# Patient Record
Sex: Female | Born: 1960
Health system: Southern US, Community
[De-identification: ages and names within clinical notes are randomized; demographics above are authoritative.]

## PROBLEM LIST (undated history)

## (undated) DIAGNOSIS — E785 Hyperlipidemia, unspecified: Secondary | ICD-10-CM

## (undated) DIAGNOSIS — I10 Essential (primary) hypertension: Secondary | ICD-10-CM

## (undated) DIAGNOSIS — E119 Type 2 diabetes mellitus without complications: Secondary | ICD-10-CM

## (undated) DIAGNOSIS — R0600 Dyspnea, unspecified: Secondary | ICD-10-CM

## (undated) DIAGNOSIS — R011 Cardiac murmur, unspecified: Secondary | ICD-10-CM

## (undated) DIAGNOSIS — M1990 Unspecified osteoarthritis, unspecified site: Secondary | ICD-10-CM

## (undated) DIAGNOSIS — D509 Iron deficiency anemia, unspecified: Secondary | ICD-10-CM

## (undated) DIAGNOSIS — K7460 Unspecified cirrhosis of liver: Secondary | ICD-10-CM

## (undated) DIAGNOSIS — R188 Other ascites: Secondary | ICD-10-CM

## (undated) DIAGNOSIS — K219 Gastro-esophageal reflux disease without esophagitis: Secondary | ICD-10-CM

## (undated) DIAGNOSIS — T7840XA Allergy, unspecified, initial encounter: Secondary | ICD-10-CM

## (undated) DIAGNOSIS — R601 Generalized edema: Secondary | ICD-10-CM

## (undated) DIAGNOSIS — H269 Unspecified cataract: Secondary | ICD-10-CM

## (undated) DIAGNOSIS — I8500 Esophageal varices without bleeding: Secondary | ICD-10-CM

## (undated) HISTORY — DX: Unspecified osteoarthritis, unspecified site: M19.90

## (undated) HISTORY — PX: KNEE ARTHROSCOPY W/ MENISCAL REPAIR: SHX1877

## (undated) HISTORY — DX: Other ascites: R18.8

## (undated) HISTORY — DX: Allergy, unspecified, initial encounter: T78.40XA

## (undated) HISTORY — PX: EYE SURGERY: SHX253

## (undated) HISTORY — DX: Unspecified cataract: H26.9

## (undated) HISTORY — PX: CHOLECYSTECTOMY: SHX55

## (undated) HISTORY — DX: Generalized edema: R60.1

## (undated) HISTORY — PX: CARPAL TUNNEL RELEASE: SHX101

## (undated) HISTORY — PX: MENISCUS REPAIR: SHX5179

## (undated) HISTORY — DX: Type 2 diabetes mellitus without complications: E11.9

## (undated) HISTORY — DX: Esophageal varices without bleeding: I85.00

---
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
## 2009-06-24 ENCOUNTER — Encounter: Admission: RE | Admit: 2009-06-24 | Discharge: 2009-07-16 | Payer: Self-pay | Admitting: Family Medicine

## 2012-04-16 ENCOUNTER — Ambulatory Visit: Payer: Self-pay

## 2012-04-23 ENCOUNTER — Ambulatory Visit (INDEPENDENT_AMBULATORY_CARE_PROVIDER_SITE_OTHER): Payer: Self-pay | Admitting: Family Medicine

## 2012-04-23 DIAGNOSIS — E119 Type 2 diabetes mellitus without complications: Secondary | ICD-10-CM

## 2012-04-23 NOTE — Progress Notes (Signed)
Patient presents today for 3 month DM follow-up as part of the employer-sponsored Link to Wellness program. Medications, insulin regimen, and glucose readings have been reviewed. I have also discussed with patient lifestyle interventions such as diet and physical activity. Details of this visit can be found in [facility] documenting program through [facility] ([facility]). Patient has set a series of personal goals and will follow-up in 3 months for further review of DM.  

## 2012-05-07 NOTE — Progress Notes (Signed)
Patient ID: Meagan Peck, female   DOB: Jun 21, 1960, 52 y.o.   MRN: [id number] Reviewed:  Agree with documentation and management.

## 2012-07-23 ENCOUNTER — Ambulatory Visit (INDEPENDENT_AMBULATORY_CARE_PROVIDER_SITE_OTHER): Payer: Self-pay | Admitting: Family Medicine

## 2012-07-23 VITALS — BP 105/61 | HR 77 | Ht <= 58 in | Wt 167.4 lb

## 2012-07-23 DIAGNOSIS — E119 Type 2 diabetes mellitus without complications: Secondary | ICD-10-CM

## 2012-07-23 NOTE — Progress Notes (Signed)
Subjective:  Patient presents today for 3 month DM follow-up as part of the employer-sponsored Link to Wellness program. She reports that things have been going well. Her Lantus dose has dropped to 10 units. She reports that in February most of her CBGs were less than 100. No other medication changes.   Appointment pending with Dr. Littie Deeds in about 10 days. Her last A1C was 7.3% in December (patient reported).    Disease Assessments:  Diabetes:  Type of Diabetes: Type 2; Year of diagnosis 1999; Sees Diabetes provider 4 or more times per year; MD managing Diabetes Dr. Littie Deeds;  checks blood glucose once daily; checks feet daily; uses glucometer; takes medications as prescribed; takes an aspirin a day;  Highest CBG 146; Lowest CBG 59; hypoglycemia frequency once a month; very rare;   Other Diabetes History: Checking fasting glucose daily. She forgot to bring her meter with her today for her appointment. Per her report she thinks they are averaging between 100-105 fasting. She is taking Lantus 10 units every morning and glipizide if fasting CBG is greater than 100.   When her CBG was 59 she drank orange juice and it came back up.   Social History:  Medication adherence adherent;  Patient can afford medications; Patient knows the purpose/use of medications;   Physical Activity- she is getting physical activity at least 5 days a week and sometimes 6 days. 150 minutes of exercise per week.  When it was icy and snowy she wasn't getting as much exercise. Most of what she is doing is walking with hand weights. She is using 1 pound hand weights but is thinking about going up to 2 pound weights. She is spending 15-30 minutes walking.   Nutrition- Diet adherence Greater than 75% of the time;  She has tried several new vegetables since her last visit. She tried brussel sprouts, eggplant, zuchinni, spaghetti squash. She didn't like any of them.   B- wheat toast and boiled egg. Sometimes cereal. Fruit and  orange juice usually 8 oz.  L- chicken and lettuce sandwich; sometimes leftovers from dinner the night before.   D- chicken breasts, vegetable soup; green beans, garden peas, corn, sometimes ribs or steaks. Mostly chicken.    Assessment/Plan: 52 year old female with diabetes. Last A1C was close to goal at 7.3%. She has a follow up appointment in a few weeks with Dr. Littie Deeds and he will check her A1C at that point. Based on reported blood sugar readings I expect that her A1C will be lower. She reports that fasting CBGs are usually around 100 and for most of the month of February fasting CBGs were less than 100 and she didn't take any glipizide (but remained on 10 units of Lantus every morning).  One of her goals at her last visit was to try a new vegetable. She has since tried eggplant, zuchinni and brussel sprouts. She didn't like any of them, but she reports eating more vegetables in salads. She is willing to try asparagus before her next appointment. We reviewed carbohydrate counting and discussed her daily glass of orange juice. She did not realize that her glass of orange juice had close to 30 grams of carbohydrates. She states that she can change to only drinking 1/2 cup of orange juice or eliminating it. I encouraged her to eat whole fruit in place of juice whenever possible.   Patient is meeting physical activity goal of at least 150 minutes weekly. This is an improvement as when she first started  seeing me, she was not exercising at all. Her weight is increased about 2 pounds since her last appointment, but she reports that with the ice and snow she hasn't gotten out as much to walk.   Follow up with patient in 3 months..    Teaching Goals:  Diabetes Mellitus:  Goals for Next Visit-  1. Learn how to hide vegetables in recipes. Examples- adding chopped zuchinni to soups, trying butternut squash and steamed asparagus.  2. Try using 2 pounds weights when you are walking. Continue walking 5-6  days a week.   Next appointment with me is June 23rd at 3 PM.   Tobi Leinweber D. Vivia Ewing, PharmD, BCPS

## 2012-07-31 NOTE — Progress Notes (Signed)
Patient ID: Meagan Peck, female   DOB: 03/26/1961, 52 y.o.   MRN: [id number] ATTENDING PHYSICIAN NOTE: I have reviewed the chart and agree with the plan as detailed above. Dorcas Mcmurray MD Pager [phone]

## 2012-10-22 ENCOUNTER — Ambulatory Visit (INDEPENDENT_AMBULATORY_CARE_PROVIDER_SITE_OTHER): Payer: Self-pay | Admitting: Family Medicine

## 2012-10-22 VITALS — BP 119/68 | HR 76 | Ht <= 58 in | Wt 167.2 lb

## 2012-10-22 DIAGNOSIS — E119 Type 2 diabetes mellitus without complications: Secondary | ICD-10-CM

## 2012-10-22 NOTE — Progress Notes (Signed)
Subjective:  Patient presents today for 3 month diabetes follow-up as part of the employer-sponsored Link to Wellness program. Current diabetes regimen includes Lantus 10 SQ once daily, metformin 1000 mg twice daily, glipizde 10 mg ER once daily if FBG>100 (she estimates this is 4 times a week). Patient also continues on daily ASA and ACEi. Patient has a pending appt for early July with Dr. Littie Deeds. She states her last A1C was 6.8% about 3 months ago. No med changes or major health changes at this time.    Patient states that she recently saw a podiatrist and she had a corn growing over her toenail. She also has osteoarthritis in the arches of her feet. She was fitted with a special type of athletic shoe that gives her more support over her arches.    Disease Assessments:  Diabetes:  Type of Diabetes: Type 2; Year of diagnosis 1999; Sees Diabetes provider 4 or more times per year; MD managing Diabetes Dr. Littie Deeds; checks blood glucose once daily; checks feet daily; uses glucometer; takes medications as prescribed; takes an aspirin a day;   Lowest CBG 79; Highest CBG 154; hypoglycemia frequency once a week;  Other Diabetes History: Checking fasting glucose daily.   She did not bring her meter with her to the appointment today. Per patient report- highest is 154. Lowest- 79. She goes low about once a week- she states that she doesn't know why her blood sugar got low. She corrected it with orange juice.   On average she thinks that her blood sugar is running between 111-120 fasting.    Physical Activity-  She joined [employer] a month ago. She met with a personal trainer last week and she starts this week with training. She is going three times a week for 2-3 hours. When she is there she is doing the treadmill, elliptical and bicycle. She anticipates that once she is trained on the weight machines she will start doing that. She states that her trainer has a circuit mapped out for her of what she can  do.   Nutrition-  B- boiled egg and wheat toast or bowl of cereal (cheerios with milk); juice (8 oz) occasionally; or a piece of fruit  L- Stew (carrots, beef, potatoes); usually a [place] sub on wheat bread with lettuce or 1/2 ham sandwich. Fruit, jello.   D- She has been trying more vegetables- she has tried broccoli and asparagus. Usually eating some sort of meat- eating it baked or grilled.      Vital Signs:  10/22/2012 3:25 PM (EST) Blood Pressure 119 / 68 mm/HgBMI 34.9; Height 4 ft 10 in; Pulse Rate 76 bpm; Weight 167.2 lbs    Testing:  Blood Sugar Tests:  Hemoglobin A1c: 6.8    Assessment/Plan: Patient is a 52 year old female with DM2. Her most recent A1C was 6.8% with Dr. Littie Deeds in March. This is a decrease from her last A1C of 7.3%. She continues to make healthy eating choices and has continued to try and experiment with new vegetables. She has cut back on her juice intake and is mindful of how many carbohydrates she is eating throughout the day.   She has increased her commitment to physical activity and has joined a gym. She has a trainer now and anticipates using the weight machines once she is trained this week. She estimates that she spends 6-7 hours doing physical activity in the gym each week. This is an increase from her last appointment when she was  just walking after work. Weight remains the same at this visit but patient is hoping to lose weight.   Based on patient reports fasting blood sugars are within goal of less than 120 mg/dL. She is having ocassional low blood sugars, but none less than 80. These are easily corrected with juice.   Follow up with patient in 3 months..    Goals for Next Visit-  1. Stick with physical activity. Aim for 3 days a week for at least 60 minutes.  2. New vegetables for the month- try [employer] and try parsnips.   Next visit is Monday September 29th at 3 PM.

## 2013-01-21 NOTE — Progress Notes (Signed)
Patient ID: Meagan Peck, female   DOB: Dec 06, 1960, 52 y.o.   MRN: [id number] ATTENDING PHYSICIAN NOTE: I have reviewed the chart and agree with the plan as detailed above. Dorcas Mcmurray MD Pager [phone]

## 2013-02-01 ENCOUNTER — Ambulatory Visit (INDEPENDENT_AMBULATORY_CARE_PROVIDER_SITE_OTHER): Payer: Self-pay | Admitting: Family Medicine

## 2013-02-01 VITALS — BP 136/76 | HR 83 | Ht <= 58 in | Wt 166.0 lb

## 2013-02-01 DIAGNOSIS — E119 Type 2 diabetes mellitus without complications: Secondary | ICD-10-CM

## 2013-02-01 NOTE — Progress Notes (Signed)
Subjective:  Patient presents today for 3 month diabetes follow-up as part of the employer-sponsored Link to Wellness program. Current diabetes regimen includesLantus 10 units SQ once daily, metformin 1000 mg twice daily, Byetta 5 mcg SQ once daily and glipizide 10 mg ER once daily when FB >100. Patient also continues on daily ASA, and ACE-I.   She reports that she has had no medication changes since her last visit with me. She reports that she has been very busy lately with her church and because of this she hasn't been able to go to the gym as often. She is still walking.   She has an appointment pending to see Dr. Littie Deeds in a few weeks. She saw him over the summer in July and her A1C was 6.8%.    Disease Assessments:  Diabetes:  Type of Diabetes: Type 2; Year of diagnosis 1999; Sees Diabetes provider 4 or more times per year; MD managing Diabetes Dr. Littie Deeds; checks blood glucose once daily; checks feet daily; uses glucometer; takes medications as prescribed; takes an aspirin a day; hypoglycemia frequency once a week;   Lowest CBG 73; Highest CBG 136; Other Diabetes History: Checking once daily in the morning. She did not bring her meter with her to the appointment.   She reports she is going low once a week- usually down to 73. She drinks a sip of orange juice or eats a glucose tablet. Her blood sugar returns to normal afterward.   She reports that the highest her CBG has been is 136.      Physical Activity-  The last two weeks are not typical because she is spending more time at church getting ready for a revival.   Prior to that she was going to [employer]. 5 minute cardio, 30 minute circuit, 30 minute cardio and then using the machines afterwards. She estimates that this took 90 minutes. She was going to the gym 3 times a week. She reports that at first she felt really tired and it ,kicked her butt,. She reports now it is much better and easier to do. She does feel better overall  and she has more energy.   Nutrition-  She reports that she has had no changes to her diet. At each visit we look up a new vegetable and she tries it. This last time she tried [employer] and parsnips and she did not like either one of them. She tried broccoli this last time and she said it was OK.       Assessment/Plan:  Patient is a 52 year old female with DM2. Most recent A1C was 6.8% in July with Dr. Littie Deeds and was meeting goal of less than 7%. Patient denied A1C test today because she has an appointment pending with Dr. Littie Deeds in a couple of weeks.   With the exception of the past 2 weeks patient has been doing well with physical activity and has been exercising 2-3 times a week. Patient has lost about 1 pound since her last appointment with me, and is overall 3 pounds lower than she was approximately 18 months ago. I encouraged her to continue physical activity with the goal of additional weight loss.   Patient continues to try new vegetables. She is particular about what she eats and reports that she does not like the taste of many vegetables. She agreed to try artichokes this time before her next visit with me.  Based on patient reports fasting blood sugars are within goal of less  than 120 mg/dL. She is having ocassional low blood sugars, but none less than 70. These are easily corrected with juice.   Follow up with patient in 3 months.    Goals for Next Visit-  1. Try the steamable broccoli/cauliflower bags from [employer]. Also try artichokes.  2. Get back into the routine of going to the gym and exercising.   Next appointment to see me is Monday January 5th at 3 PM.

## 2013-05-13 ENCOUNTER — Ambulatory Visit (INDEPENDENT_AMBULATORY_CARE_PROVIDER_SITE_OTHER): Payer: Self-pay | Admitting: Family Medicine

## 2013-05-13 VITALS — BP 142/71 | HR 80 | Ht <= 58 in | Wt 160.2 lb

## 2013-05-13 DIAGNOSIS — E119 Type 2 diabetes mellitus without complications: Secondary | ICD-10-CM

## 2013-05-13 NOTE — Progress Notes (Signed)
Subjective:  Patient presents today for 3 month diabetes follow-up as part of the employer-sponsored Link to Wellness program. Current diabetes regimen includes lantus, byetta, and glipizide when CBG>100. Patient also continues on daily ASA, ACEi, and statin. Most recent MD follow-up was 01/2013. Patient has a pending appt with Dr. Colin Rhein. May possibly be discontinuing glipizide per MD (pending). Currently CBG 79-126, recent high of 174.         Disease Assessments:  Diabetes:  Type of Diabetes: Type 2; Year of diagnosis 1999; Sees Diabetes provider 4 or more times per year; MD managing Diabetes Dr. Colin Rhein; checks blood glucose once daily; checks feet daily; uses glucometer; takes medications as prescribed; takes an aspirin a day; Highest CBG 174; hypoglycemia frequency twice a month; Lowest CBG 68; Other Diabetes History: Continues to check once daily in the morning. Did not bring meter with her today. Glucose levels are self reported.  Reports CBGs 70s-120s. Highest 174, but rare. Lowest 68, but denies feelings and symptoms of hypoglycemia. She says she knows when her sugars will be low based on what she eats the night before. Reports numbers getting this low only twice a month. Takes glipizide around 4 times per week. Feet and toes normal, denies any issues/complaints.      Physical Activity-  Continues going to [employer], 2 days into the gym now, trying to go 3 days a week. 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 Has lost 6 lbs since she's been here. Goal weight = 120lbs.   Nutrition-  She reports that she has had no changes to her diet. At each visit we look up a new vegetable and she tries it. Last time she tried [employer] and parsnips and she did not like either one of them. This visit, she reports that she likes eating broccoli with cheese but not steamed cauliflower (can only tolerate raw)       Preventive Care:  Last Complete Physical:  08/30/2011  Hemoglobin A1c: 03/05/2013 via Dr. Cherlynn Polo office 6.8    Colonoscopy: 05/02/1998  Dilated Eye Exam: 07/23/2012  Flu vaccine: 02/04/2013  Foot Exam: 09/17/2012  Lipid Panel: 04/25/2011  Other Preventive Care Notes: Last dental exam: 12/2012       Vital Signs:  05/13/2013 2:07 PM (EST) Blood Pressure 142 / 71 mm/HgBMI 33.5; Height 4 ft 10 in; Pulse Rate 80 bpm; Weight 160.2 lbs    Testing:  Blood Sugar Tests:  Hemoglobin A1c: 6.8 via Dr. Cherlynn Polo office resulted on 01/28/2013    Objective:  Musculoskeletal: feet and toes normal.    Assessment/Plan: Patient is a 53 year old female with DM2. Most recent A1C was 6.8% in October, 2014 with Dr. Colin Rhein and was meeting goal of less than 7%. Patient denied A1C test today because she has an appointment pending with Dr. Colin Rhein in a couple of weeks.   Patient has been doing well with her weight loss goals, having lost 6lbs since her last visit. She reports less physical activity in the last several weeks because of the holidays but is now 2 days into her normal exercising 2-3 times a week at [employer] with her personal trainer. She enjoys circuit training and would like to reach a weight goal of 120 lbs. I encouraged her to continue physical activity with the goal of additional weight loss, approximately 3lbs per month.   Patient continues to try new vegetables. She is particular about what she eats and reports  that she does not like the taste of many vegetables. She says she has liked trying broccoli and cheese but cannot tolerate steamed cauliflower (can only eat it raw). 24 hour recall; Breakfast: cereal and wheat toast; Lunch: Cold cut sandwiches; Dinner: Chicken, Deer, and some vegetables. Patient encouraged to incorporate vegetables into other parts of her meals and to try finding recipes that might mask the taste vegetables.   Based on patient reports fasting blood sugars are within goal of less than 120 mg/dL. She is having  ocassional low blood sugars, but none less than 70. These are easily corrected with juice or glucose tablets. Highest sugar reported to be 174 but exceptionally rare.   Follow up with patient in 6 months.  Patient seen by Fredna Dow, PharmD, Resident..     Goals for next visit: - Continue exercising 3 times per week (2 hours per visit at the gym) - Lose 10 lbs every 3 months (~3lbs per month) - Search for new recipes where you can hide vegetables (zucchini, eggplant) - Include more vegetables in meals other than dinner Next visit to see me Monday November 18, 2013 at 3:00 pm

## 2013-10-30 ENCOUNTER — Encounter: Payer: Self-pay | Admitting: *Deleted

## 2013-10-30 NOTE — Progress Notes (Signed)
Tried to reach patient by phone to schedule a diabetic follow up but it was just busy.  Patient works from home also so unable to reach at work.  Mailed an appt reminder letter for patient to contact us to schedule an appt. Jazmin Hartsell,CMA

## 2013-11-18 ENCOUNTER — Ambulatory Visit (INDEPENDENT_AMBULATORY_CARE_PROVIDER_SITE_OTHER): Payer: Self-pay | Admitting: Family Medicine

## 2013-11-18 VITALS — BP 123/78 | HR 73 | Ht <= 58 in | Wt 165.8 lb

## 2013-11-18 DIAGNOSIS — E119 Type 2 diabetes mellitus without complications: Secondary | ICD-10-CM

## 2013-11-18 NOTE — Progress Notes (Signed)
Subjective:  Patient presents today for 3 month diabetes follow-up as part of the employer-sponsored Link to Wellness program.  Current diabetes regimen includes Byetta 5 mcg once daily, glipizide ER 10 mg once daily when FBG >100, Lantus 10 units SQ once daily, metformin 1000 mg BID.  Patient also continues on daily ASA, and ACE-I.  Patient states that she has been very busy at work. 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 She is working 50-60 hours a week. She hasn't been exercising as much lately.  Last visit with Dr. Colin Rhein was in May. She states that her most recent A1C in May was 6.8%. One medication discontinued- Centratex (vitamin supplement). Quinine 324 mg daily was added for leg cramps.   Diabetes:  Type of Diabetes: Type 2; Year of diagnosis 1999; Sees Diabetes provider 4 or more times per year; MD managing Diabetes Dr. Colin Rhein; checks blood glucose once daily; checks feet daily; uses glucometer; takes medications as prescribed; takes an aspirin a day; hypoglycemia frequency rare;   Lowest CBG 76; Highest CBG 156;   Other Diabetes History:  Continues to check once daily in the morning. Did not bring meter with her today. Glucose levels are self reported.    She reports that on average her blood sugar is about 120-125 in the morning. It has been up to 150 when she has been eating more fruit. She states that hypoglycemia is rare.  Physical Activity-  She is still going to the Gym, but not as much as she was doing before. She is going to the gym 1-2 times a week for about 2 hours. She is doing cardio (treadmill, bike). She is also doing Engineer, structural. Walking most days for about 30 minutes.   Nutrition-  She reports that she has been eating more fruit over the summer. She noticed that her blood sugar was higher on the days she was eating more fruit.   Hemoglobin A1c: 09/03/2013 via Dr. Cherlynn Polo office 6.8   11/18/2013 4:37 PM (EST) Blood  Pressure 123 / 78 mm/HgBMI 34.6; Height 4 ft 10 in; Pulse Rate 73 bpm; Weight 165.8 lbs   Assessment/Plan: Patient is a 53 year old female with DM2. Most recent A1C was 6.8% and is meeting goal of less than 7%. She has a pending appointment to visit with Dr. Colin Rhein in a couple of weeks. She has gained 5 pounds since her last appointment with me. Patient attributes this to not being to exercise as much in the past several months due to her increased work schedule.     Self reported blood sugars are in range. She states she is usually running in the low 100s. She denies hypoglycemia. Elevated CBGs are attributed to increased fruit consumption.     Patient continues to make healthy eating choices. She is also still exercising, just not as much. She is not going to the gym as frequently as she was before. She continues to walk.     I showed patient how to sign up for the Wellness Rewards website and claim her badges.     No other major changes. Follow up with patient in 3 months. .   Goals for Next Visit-     1. Log onto the wellness website and complete your online wellness profile. Once you have done that, go to the Rewards section to verify that you have earned the badge.  2. To schedule an appointment with the dietitian, call the  [facility]- [phone]) [phone]  3. Increase physical activity- try to extend walk to 40 or 45 minutes daily.   4. Bring me the letter from the family practice center.      Next appointment to see me is Monday December 14th at 3 PM.

## 2013-12-02 ENCOUNTER — Encounter: Payer: Self-pay | Admitting: Family Medicine

## 2013-12-02 NOTE — Progress Notes (Signed)
Patient ID: Meagan Peck, female   DOB: Apr 28, 1961, 53 y.o.   MRN: [id number] Reviewed: Agree with our Pharmacologist's documentation and management.

## 2013-12-02 NOTE — Progress Notes (Signed)
Patient ID: Meagan Peck, female   DOB: [birth year], 53 y.o.   MRN: [id number] Reviewed: Agree with our Pharmacologist's documentation and management.

## 2013-12-26 ENCOUNTER — Encounter: Payer: Self-pay | Admitting: Family Medicine

## 2013-12-26 NOTE — Progress Notes (Signed)
Patient ID: Meagan Peck, female   DOB: 10/20/1960, 53 y.o.   MRN: [id number] Reviewed: Agree with the documentation and management by my Cassandra.

## 2014-04-14 ENCOUNTER — Ambulatory Visit (INDEPENDENT_AMBULATORY_CARE_PROVIDER_SITE_OTHER): Payer: Self-pay | Admitting: Family Medicine

## 2014-04-14 VITALS — BP 116/62 | Wt 165.2 lb

## 2014-04-14 DIAGNOSIS — E119 Type 2 diabetes mellitus without complications: Secondary | ICD-10-CM

## 2014-04-14 NOTE — Assessment & Plan Note (Signed)
Subjective:  Patient presents today for 3 month diabetes follow-up as part of the employer-sponsored Link to Wellness program. Current diabetes regimen includes Lantus 10-14 units once daily; metformin 1000 mg BID, Byetta 5 mcg once daily and Glipizide 10 mg ER once daily. Patient also continues on daily ASA, ACEi. No major health changes at this time.   Patient states that things have been really busy lately. 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  Medication change- Lantus dose has increased. She is now taking 10 units once daily for FBG less than 120; 12 units for FBG 120-140; 14 units for FBG >140.  A1C at last visit with Dr. Colin Rhein was 7.1% in October. She goes back to see him in January.   Disease Assessments:  Diabetes: Type of Diabetes: Type 2; Year of diagnosis 1999; Sees Diabetes provider 4 or more times per year; MD managing Diabetes Dr. Colin Rhein; checks blood glucose once daily; checks feet daily; uses glucometer; takes medications as prescribed; takes an aspirin a day; hypoglycemia frequency rare;   Highest CBG 200 ate dinner right before she went to bed.; Lowest CBG 89; Other Diabetes History:   Continues to check once daily in the morning fasting. Did not bring meter with her today. Glucose levels are self reported.  She states that she is running usually around 125-126. Lantus dose changed at last appointment- she is now taking 10 units for FBG<120; 12 units for FBG 120-140; 14 units for FBg >140. Also taking Glipizide ER 10 mg daily, Byetta 5 mcg daily, Metformin 1000 mg BID. Denies hypoglycemia.     Tobacco Assessment: Smoking Status: Never smoker; Last Reviewed: 04/14/2014   Social History:  Caffeine use: soda 1 per daysoda occasionally. ; Diet adherence Greater than 75% of the time; Denies alcohol use; Social work consult was not done; No drug use; Medication adherence adherent; sexually active; Patient can afford medications;  Patient knows the purpose/use of medications; Exercise adherence 3-4 days a week; 270 minutes of exercise per week.  Occupation: Medical Record Coder  Physical Activity- She is going to the gym once a week for 3 hours on the weekends. During the week she is walking 30-45 minutes daily.  Nutrition- She reports no major changes. Sometimes she is skipping dinner because she is working late. When this happens she is just eating snack in the late afternoon (apple, cheese crackers). Always eating at least 2 meals daily.    Preventive Care:  Last Complete Physical: 08/30/2011  Hemoglobin A1c: 01/30/2014 via Dr. Cherlynn Polo office 7.1    Colonoscopy: 05/02/1998  Dilated Eye Exam: 03/03/2014  Flu vaccine: 01/30/2014  Foot Exam: 09/17/2012  Lipid Panel: 04/25/2011  Other Preventive Care Notes:  Last dental exam: June 2015      Overall Health Assessments:  Vision:  Dilated Eye Exam: 03/03/2014   Vital Signs:  04/14/2014 4:52 PM (EST)Blood Pressure 116 / 62 mm/HgBMI 34.5; Height 4 ft 10 in; Weight 165.2 lbs   Testing:  Blood Sugar Tests: Hemoglobin A1c: 7.1 via Dr. Colin Rhein resulted on 01/30/2014   Care Planning:  Learning Preference Assessment:  Learner: Patient  Readiness to Learn Barriers: None  Teaching Method: Explanation  Evaluation of Learning: Can function independently and verbalize knowledge  Readiness to Change:  How important is your health to you? 10  How confident are you in working to improve your health? 8  How ready are you to change to improve your health? 10  Total Score: 9  Care Plan:  04/14/2014 4:52 PM (EST) (1)  Problem: [facility] at [employer]  Role: Clinical Pharmacist  Long Term Goal Log onto [email or web address] to finish your wellenss profile and claim your reward.  Date Started: 04/14/2014     Assessment/Plan: Patient is a 53 year old female with DM2. Most recent A1C in October was 7.1% and is close to meeting goal of less than 7%.  Patient has been under increased stress lately as she is working long hours. She has also decreased physical activity frequency because of her long working hours. She is taking more Lantus than she was at her last visit with me and FBG are running slightly higher than they were at last visit. Patient is hoping that she can cut back to 40 hours a week soon and then increase physical activity again.  Showed patient how to log into the wellness website in order to claim her wellness rewards. Follow up with patient in 3 months.  Goals for Next Visit- 1. Continue physical activity (walking). Increase the number of times that you exercise at the gym as much as you can given your work schedule. 2. Log into the wellness website so you can finish your wellness profile. Think about going to see a dietitian in order to earn the healthy weight badge. Start logging in your walks. Next appointment to see me is Monday March 21st at 3 PM.

## 2014-05-15 NOTE — Progress Notes (Signed)
Patient ID: Meagan Peck, female   DOB: [name] 28, 1961, 54 y.o.   MRN: [id number] Reviewed: Agree with the documentation and management of our [place].

## 2014-07-21 ENCOUNTER — Ambulatory Visit: Payer: Self-pay | Admitting: Pharmacist

## 2014-08-04 ENCOUNTER — Encounter: Payer: Self-pay | Admitting: Pharmacist

## 2014-08-04 ENCOUNTER — Other Ambulatory Visit: Payer: Self-pay | Admitting: Pharmacist

## 2014-08-04 NOTE — Patient Outreach (Unsigned)
[facility]) Care Management  [place]   08/04/2014  Meagan Peck August 25, 1960 [id number]   Subjective:  Patient presents today for 3 month diabetes follow-up as part of the employer-sponsored Link to Wellness program.  Current diabetes regimen includes Byetta 5 mcg SQ BID, Glipizide XL 10 mg po daily if fasting CBG >100, Lantus 10-14 units SQ once daily.  Patient also continues on daily ASA, ACE Inhibitor.  Most recent MD follow-up was in February with Dr. Colin Rhein. A1C at that time was 7.8%.  Patient has a pending appt for May 2016. No major health changes at this time. Byetta was increased to 5 mcg BID at last visit. She states that she is taking glipizide 4-5 days a week.   Patient states that she is doing well. She has enrolled in the [facility] trial through work and is monitoring CBG through the glucometer provided with the program. She also has a blue tooth enabled scale and pedometer she is using with the program.     Current Medications: Current Outpatient Prescriptions  Medication Sig Dispense Refill  . aspirin 81 MG tablet Take 81 mg by mouth daily.    . Cholecalciferol (VITAMIN D3) 5000 UNITS TABS Take 1 tablet by mouth daily.    Marland Kitchen exenatide (BYETTA) 5 MCG/0.02ML SOLN Inject 5 mcg into the skin daily before breakfast.    . glipiZIDE (GLUCOTROL XL) 10 MG 24 hr tablet Take 10 mg by mouth daily. Take 1 tablet daily if fasting blood sugar is greater than 100.    . hydrochlorothiazide (HYDRODIURIL) 25 MG tablet Take 25 mg by mouth daily.    . insulin glargine (LANTUS) 100 UNIT/ML injection Inject 10-14 Units into the skin daily. If FBG >120, 10 units; FBG 120-140, 12 units; FBG >140, 14 units.    Meagan Peck 500 MG CAPS Take 500 mg by mouth daily.    Marland Kitchen lisinopril (PRINIVIL,ZESTRIL) 40 MG tablet Take 40 mg by mouth daily.    . metFORMIN (GLUCOPHAGE) 1000 MG tablet Take 1,000 mg by mouth 2 (two) times daily with a meal.    . pantoprazole (PROTONIX) 40 MG tablet  Take 40 mg by mouth 2 (two) times daily.     No current facility-administered medications for this visit.    [place]        Patient Outreach from 08/04/2014 in [place] Problem One  Physical Inactivity   Care Plan for Problem One  Active   Interventions for Problem One Long Term Goal  Encouraged patient to add a daily walk after dinner and continue going to the [employer] at least once a week and as her schedule allows. Reviewed the importance of physical activity in relieving stress and improving blood sugar.    [facility] Long Term Goal (31-90 days)  Increase physical activity to achieve 10,000 steps daily.    [facility] Long Term Goal Start Date  08/04/14     Assessment/Plan:  Patient is a 54  yo female/female with DM 2. Most recent A1C was  7.8 % which is at exceeding goal of less than 7%. Weight is stable from last visit with me.   CBG review shows that the majority of readings are in goal range of 80-120 mg/dL. She has a few excursions into the 150-170 range. She states that she is taking Lantus on a daily basis and ends up taking glipizide 4-5 times a week.   Lifestyle improvements:  Physical Activity-  She hasn't been able to exercise as much as she was previously because she is working 10-11 hour days now. She is still getting to the gym once a week and is walking here and there, but that's it. She is now wearing a pedometer as part of the [place] study that she is participating with and she is averaging 6000-7000 steps a day. She states she would like to get 10,000 steps daily but isn't sure that she can spend as much time as she needs to exercising.   Nutrition-  She reports no major changes since the last visit with me. She is not counting carbohydrates but is following the plate method.   Patient had the One Touch Meter which is no longer covered by the Link to Wellness program. Gave patient a new True Result Meter. I will fax Dr.  Cherlynn Polo office for new testing supplies.   Reviewed with patient how to log into the wellness website and claim her wellness rewards. Showed to patient how to sync the [facility] app so that she can record her steps into the program.    Follow up with me in 3 months.     Goals for Next Visit:  1. Log onto the wellness website and claim your rewards. Finish your wellness profile and sync your Withings app to the website:  [email or web address]  2. Work to achieve 10,000 steps daily. Ways to achieve this- longer, non direct trips to the bathroom; short walks after dinner etc.       Next appointment to see me is: Monday July 18th at 4:30 PM.  T[place]ett [place]. Donneta Romberg, PharmD, BCPS, CDE Norm Parcel to [place] Coordinator [phone]

## 2014-08-04 NOTE — Patient Instructions (Signed)
Goals for Next Visit:  1. Log onto the wellness website and claim your rewards. Finish your wellness profile and sync your Withings app to the website:  [email or web address]  2. Work to achieve 10,000 steps daily. Ways to achieve this- longer, non direct trips to the bathroom; short walks after dinner etc.       Next appointment to see me is: Monday July 18th at 4:30 PM.

## 2014-08-05 ENCOUNTER — Other Ambulatory Visit: Payer: Self-pay

## 2014-09-26 ENCOUNTER — Encounter: Payer: Self-pay | Admitting: Pharmacist

## 2014-11-17 ENCOUNTER — Ambulatory Visit: Payer: 59 | Admitting: Pharmacist

## 2014-12-01 ENCOUNTER — Ambulatory Visit: Payer: Self-pay | Admitting: Pharmacist

## 2014-12-01 ENCOUNTER — Ambulatory Visit (INDEPENDENT_AMBULATORY_CARE_PROVIDER_SITE_OTHER): Payer: Self-pay | Admitting: Family Medicine

## 2014-12-01 VITALS — BP 116/64 | Ht <= 58 in | Wt 163.8 lb

## 2014-12-01 DIAGNOSIS — E119 Type 2 diabetes mellitus without complications: Secondary | ICD-10-CM

## 2014-12-01 NOTE — Progress Notes (Signed)
Subjective:  Patient presents today for 3 month diabetes follow-up as part of the employer-sponsored Link to Wellness program.  Current diabetes regimen includes Byetta 5 mcg SQ once daily, metformin 1000 mg BID, Glipizide 10 mg XL once daily if FBG>100, Lantus 8-12 units SQ once daily.   Patient also continues on daily ASA, ACE Inhibitor.  No med changes or major health changes at this time.   Last appointment with Dr. Colin Rhein was in June. A1C was 7.2% at that visit. Next appointment is pending for September.    Assessment/Plan:  Patient is a 54 y.o. female with DM 2. Most recent A1C was   7.2% which is slightly exceeding goal of less than 7%. Weight is stable from last visit with me.   CBG Review: Patient did not bring meter with her to the appointment.   Patient reported: Low/High: 74/160 She is checking once daily fasting and she reports the majority of the readings are between 110-120.   A few low blood sugars in the 70s. She reports no symptoms with these. She take glucose tablets to correct for the low.   Lifestyle improvements:  Physical Activity-  Work has been busier lately and she is not exercising as much as she was previously. She is walking for 20-30 minutes 6 days a week. She has not been going to [employer] like she was previously.    Nutrition-  Patient reports no changes. She does state that she is eating more fruit lately and wanted to know what fruit would be best for her. I reviewed nutrition information with her regarding the fruit she most commonly eats and reviewed with patient how to calculate carbohydrate quantity using portion sizes. Also reminded patient of carbohydrate goals of 45-60 grams with meals.    Follow up with me in 3 months.    Goals for Next Visit:  1. Log onto the Live [facility] Well website and claim your wellness rewards.  2. Keep walking six days a week for at least 30 minutes.     Next appointment to see me is: Monday November 14th at  2:30 PM.    Jinny Blossom D. Donneta Romberg, PharmD, BCPS, CDE Norm Parcel to [place] Coordinator [phone]

## 2014-12-13 NOTE — Progress Notes (Signed)
Patient ID: Meagan Peck, female   DOB: 23-Dec-1960, 54 y.o.   MRN: [id number] ATTENDING PHYSICIAN NOTE: I have reviewed the chart and agree with the plan as detailed above. Dorcas Mcmurray MD Pager [phone]

## 2015-03-16 ENCOUNTER — Ambulatory Visit: Payer: 59 | Admitting: Pharmacist

## 2015-03-30 ENCOUNTER — Ambulatory Visit: Payer: Self-pay | Admitting: Pharmacist

## 2015-04-03 ENCOUNTER — Ambulatory Visit (INDEPENDENT_AMBULATORY_CARE_PROVIDER_SITE_OTHER): Payer: Self-pay | Admitting: Family Medicine

## 2015-04-03 ENCOUNTER — Encounter: Payer: Self-pay | Admitting: Pharmacist

## 2015-04-03 VITALS — BP 106/64 | Ht <= 58 in | Wt 161.2 lb

## 2015-04-03 DIAGNOSIS — E119 Type 2 diabetes mellitus without complications: Secondary | ICD-10-CM

## 2015-04-03 NOTE — Progress Notes (Signed)
Subjective:  Patient presents today for 3 month diabetes follow-up as part of the employer-sponsored Link to Wellness program.  Current diabetes regimen includes Lantus 10-12 untis SQ once daily, Byetta 5 mcg SQ BID, Glipizide 10 mg daily when FBG>100.  Patient also continues on daily ASA and ACE Inhibitor.  Most recent MD follow-up was with Dr. Colin Rhein in September. A1C at that visit 7.1%.  Patient has a pending appt for this coming week with Dr. Colin Rhein. No major health changes at this time. Byetta was increased to 5 mcg BID.     Assessment:  Diabetes: Most recent A1C was 7.1   % which is at slightly exceeding goal of less than 7%. Weight is decreased from last visit with me.   Patient is not taking a statin currently. Based on the ASCVD algorithm her 10 year cardiovascular risk is 2.6% and it is recommended that she be on a moderate intensity statin. I asked patient to bring this up with Dr. Colin Rhein at her next visit this upcoming week.    CBG Review: Patient did not bring meter with her to the appointment. She is checking her blood sugar once daily in the morning, fasting. Per patient report she states it is running about 120-124. Highest- 136, Lowest- 82 (skipped dinner the night before). Denies hypoglycemia.    Lifestyle improvements:  Physical Activity-  Patient states that she is trying to take the stairs instead. She is no longer going to [employer] to work out. She states she is walking five days a week for 30 minutes.    Nutrition-  No major changes. She states that she has cut back on potatoes.    Follow up with me in 3 months.    Plan/Goals for Next Visit:  1. Log into the Wellness Website- [email or web address] and start tracking your walks.  2. Once before you come back for your next appointment, try out the fitness room on the 1st floor of [facility].     Next appointment to see me is: Monday March 6th at 4 PM.    Jinny Blossom D. Donneta Romberg, PharmD, BCPS, CDE Norm Parcel  to [place] Coordinator [phone]

## 2015-04-16 NOTE — Progress Notes (Signed)
I have reviewed this pharmacist's note and agree  

## 2015-05-21 DIAGNOSIS — E039 Hypothyroidism, unspecified: Secondary | ICD-10-CM | POA: Diagnosis not present

## 2015-05-21 DIAGNOSIS — E785 Hyperlipidemia, unspecified: Secondary | ICD-10-CM | POA: Diagnosis not present

## 2015-05-21 DIAGNOSIS — E119 Type 2 diabetes mellitus without complications: Secondary | ICD-10-CM | POA: Diagnosis not present

## 2015-05-25 DIAGNOSIS — Z794 Long term (current) use of insulin: Secondary | ICD-10-CM

## 2015-05-25 DIAGNOSIS — I10 Essential (primary) hypertension: Secondary | ICD-10-CM | POA: Insufficient documentation

## 2015-05-25 DIAGNOSIS — E119 Type 2 diabetes mellitus without complications: Secondary | ICD-10-CM | POA: Insufficient documentation

## 2015-05-25 DIAGNOSIS — E1169 Type 2 diabetes mellitus with other specified complication: Secondary | ICD-10-CM | POA: Insufficient documentation

## 2015-05-25 DIAGNOSIS — K219 Gastro-esophageal reflux disease without esophagitis: Secondary | ICD-10-CM | POA: Insufficient documentation

## 2015-05-25 DIAGNOSIS — E785 Hyperlipidemia, unspecified: Secondary | ICD-10-CM | POA: Diagnosis not present

## 2015-05-25 MED FILL — ROSUVASTATIN CALCIUM 20 MG: 20 | 90 days supply | Qty: 90 | Fill #0

## 2015-05-27 DIAGNOSIS — D3132 Benign neoplasm of left choroid: Secondary | ICD-10-CM | POA: Diagnosis not present

## 2015-05-27 DIAGNOSIS — H5203 Hypermetropia, bilateral: Secondary | ICD-10-CM | POA: Diagnosis not present

## 2015-05-27 DIAGNOSIS — H1859 Other hereditary corneal dystrophies: Secondary | ICD-10-CM | POA: Diagnosis not present

## 2015-05-27 DIAGNOSIS — E119 Type 2 diabetes mellitus without complications: Secondary | ICD-10-CM | POA: Diagnosis not present

## 2015-05-27 DIAGNOSIS — D3131 Benign neoplasm of right choroid: Secondary | ICD-10-CM | POA: Diagnosis not present

## 2015-06-04 MED FILL — PANTOPRAZOLE SOD DR 40 MG T: 40 | 90 days supply | Qty: 180 | Fill #0

## 2015-06-04 MED FILL — glipiZIDE XL 10 MG TB24: 10 | 90 days supply | Qty: 90 | Fill #3

## 2015-06-04 MED FILL — LISINOPRIL 40 MG TABLET: 40 | 90 days supply | Qty: 90 | Fill #3

## 2015-06-04 MED FILL — TRUE METRIX GLUCOSE TEST ST: 90 days supply | Qty: 100 | Fill #0

## 2015-06-04 MED FILL — HYDROCHLOROTHIAZIDE 25 MG T: 25 | 90 days supply | Qty: 90 | Fill #3

## 2015-06-19 MED FILL — LANTUS SOLOSTAR 100 UNITS/M: 100 | 30 days supply | Qty: 15 | Fill #2

## 2015-06-25 MED FILL — metFORMIN HCL 1000 MG TABS: 1000 | 90 days supply | Qty: 180 | Fill #0

## 2015-07-06 ENCOUNTER — Ambulatory Visit (INDEPENDENT_AMBULATORY_CARE_PROVIDER_SITE_OTHER): Payer: Self-pay | Admitting: Family Medicine

## 2015-07-06 ENCOUNTER — Encounter: Payer: Self-pay | Admitting: Pharmacist

## 2015-07-06 VITALS — BP 110/58 | Wt 159.0 lb

## 2015-07-06 DIAGNOSIS — E119 Type 2 diabetes mellitus without complications: Secondary | ICD-10-CM

## 2015-07-06 DIAGNOSIS — Z794 Long term (current) use of insulin: Secondary | ICD-10-CM

## 2015-07-06 NOTE — Progress Notes (Signed)
Subjective:  Patient presents today for 3 month diabetes follow-up as part of the employer-sponsored Link to Wellness program.  Current diabetes regimen includes metformin 1000 mg BID, Byetta 5 mcg SQ BID, glipizide 10 mg ER when FBG>100, Lantus 14 units SQ once daily. Patient also continues on daily ASA, ACE Inhibitor and statin.  Most recent MD follow-up was with Dr. Colin Rhein in January. A1C at that visit was 7.8%.   Patient has a pending appt for April with Dr. Colin Rhein.  No major health changes at this time.   Medication changes- Dr. Colin Rhein increased lantus to 14 units SQ daily. Patient has also started a statin based on recommendations from last visit- now taking crestor 20 mg three times a week. She states she had some muscle pain the first week but it has diminished since then.    Assessment:  Diabetes: Most recent A1C was  7.8 % which is at exceeding goal of less than 7%. Weight is decreased from last visit with me.   Patient thinks that her A1C was increased because she was eating more over Christmas. Historically she says her A1C has been elevated at the beginning of the year.   CBG Review: She reports that she has not had any low blood sugars since increasing Lantus.   She is checking her blood sugar once daily in the morning. She forgot to bring her meter with her to the appointment. Per patient report she states that she is running about 120 in the morning (prior to increasing Lantus she was running 130). Right now she is taking glipizide every day during the week.    Lifestyle improvements:  Physical Activity-  Weight is down 2 pounds since her last visit with me and 5 pounds overall since last year. 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 She is also taking the stairs.   She tried out the gym on the first floor of the hospital (one of her goals last time). She is walking 30 minutes daily except Sunday. She is tracking her  workouts in the [place] program.    Nutrition-  She states that things are going well with eating. She states that she still can't give up her potatoes but has cut back on portion sizes (eating twice a week, less than a cupful). She is still struggling with vegetable intake. She would like to eat more but states that she doesn't have a taste for vegetables.     Follow up with me in 3 months.    Plan/Goals for Next Visit:  1. Make an appointment to see the dentist. 2. Increase vegetable intake- aim for once a week having a snack of raw broccoli or cauliflower.  3. Continue physical activity- walking 5-6 days a week.      Next appointment to see me is: Monday June 5th at 4 PM.    Jinny Blossom D. Donneta Romberg, PharmD, BCPS, CDE Norm Parcel to [place] Coordinator [phone]

## 2015-07-20 MED FILL — BYETTA 5 MCG DOSE PEN INJ: 5 | 90 days supply | Qty: 4 | Fill #0

## 2015-08-12 MED FILL — UNIFINE PENTIPS 8MM 31G: 31G X 8 MM | 90 days supply | Qty: 300 | Fill #2

## 2015-08-13 NOTE — Progress Notes (Signed)
I have reviewed this pharmacist's note and agree  

## 2015-09-02 MED FILL — glipiZIDE XL 10 MG TB24: 10 | 90 days supply | Qty: 90 | Fill #0

## 2015-09-02 MED FILL — HYDROCHLOROTHIAZIDE 25 MG T: 25 | 90 days supply | Qty: 90 | Fill #4

## 2015-09-02 MED FILL — PANTOPRAZOLE SOD DR 40 MG T: 40 | 90 days supply | Qty: 180 | Fill #1

## 2015-09-02 MED FILL — LISINOPRIL 40 MG TABLET: 40 | 90 days supply | Qty: 90 | Fill #0

## 2015-09-23 MED FILL — metFORMIN HCL 1000 MG TABS: 1000 | 90 days supply | Qty: 180 | Fill #1

## 2015-10-05 ENCOUNTER — Ambulatory Visit: Payer: Self-pay | Admitting: Pharmacist

## 2015-10-05 MED FILL — BYETTA 5 MCG DOSE PEN INJ: 5 | 90 days supply | Qty: 4 | Fill #1

## 2015-10-05 MED FILL — TRUE METRIX GLUCOSE TEST ST: 90 days supply | Qty: 100 | Fill #0

## 2015-10-05 MED FILL — LANTUS SOLOSTAR 100 UNITS/M: 100 | 75 days supply | Qty: 15 | Fill #0

## 2015-10-06 DIAGNOSIS — E119 Type 2 diabetes mellitus without complications: Secondary | ICD-10-CM | POA: Diagnosis not present

## 2015-10-06 DIAGNOSIS — Z794 Long term (current) use of insulin: Secondary | ICD-10-CM | POA: Diagnosis not present

## 2015-10-07 DIAGNOSIS — M24611 Ankylosis, right shoulder: Secondary | ICD-10-CM | POA: Diagnosis not present

## 2015-10-07 DIAGNOSIS — M25561 Pain in right knee: Secondary | ICD-10-CM | POA: Diagnosis not present

## 2015-10-12 DIAGNOSIS — I10 Essential (primary) hypertension: Secondary | ICD-10-CM | POA: Diagnosis not present

## 2015-10-12 DIAGNOSIS — M25561 Pain in right knee: Secondary | ICD-10-CM | POA: Diagnosis not present

## 2015-10-12 DIAGNOSIS — E119 Type 2 diabetes mellitus without complications: Secondary | ICD-10-CM | POA: Diagnosis not present

## 2015-10-12 DIAGNOSIS — Z794 Long term (current) use of insulin: Secondary | ICD-10-CM | POA: Diagnosis not present

## 2015-10-12 DIAGNOSIS — Z1211 Encounter for screening for malignant neoplasm of colon: Secondary | ICD-10-CM | POA: Diagnosis not present

## 2015-10-30 ENCOUNTER — Encounter: Payer: Self-pay | Admitting: Pharmacist

## 2015-10-30 ENCOUNTER — Other Ambulatory Visit: Payer: Self-pay | Admitting: Pharmacist

## 2015-10-30 ENCOUNTER — Ambulatory Visit: Payer: Self-pay | Admitting: Pharmacist

## 2015-10-30 VITALS — BP 92/60 | Wt 158.8 lb

## 2015-10-30 DIAGNOSIS — Z794 Long term (current) use of insulin: Secondary | ICD-10-CM

## 2015-10-30 DIAGNOSIS — E119 Type 2 diabetes mellitus without complications: Secondary | ICD-10-CM

## 2015-10-30 NOTE — Patient Outreach (Signed)
Subjective:  Patient presents today for 3 month diabetes follow-up as part of the employer-sponsored Link to Wellness program.  Current diabetes regimen includes Lantus 14 units SQ once daily, glipizide XL 10 mg if FBG >100 mg/dL, Byetta 5 mcg SQ BID and metformin 1000 mg BID. Patient also continues on daily ASA, ACE Inhibitor and statin.  Most recent MD follow-up was with Dr. Colin Rhein earlier this month. A1C at that visit was 8.0%. Patient has a pending appt for September.  No med changes or major health changes at this time.   Patient reports that she had a steroid injection into her knee in May and that may have contributed to an elevated A1C.   Assessment:  Diabetes: Most recent A1C was 8.0%  which is at exceeding goal of less than 7%. Weight is stable from last visit with me.    CBG Review: She reports checking CBG once daily fasting. She did not bring her meter with her to the appointment. Per patient report she states that fasting CBGs lately are averaging 140 mg/dL. For the few weeks after the steroid injection fasting CBGs were in the high 100s, low 200s. They have since come back down. Patient states that she has been taking glipizide XL 10 mg on a daily basis for the past several months.   Per patient report- Low/High- 99/236; denies hypoglycemia.    Lifestyle improvements:  Physical Activity-  She states that she is still walking although not as briskly as she was previously. She injured her knee in May following a rogue stove that fell off a hand truck into her knee. She states that she is walking 6 days a week for 30 minutes.    Nutrition-  No major changes.    Follow up with me in 6 weeks for a phone visit.    Plan/Goals for Next Visit:  1. If you have to receive another steroid injection into your knee, call Dr. Cherlynn Polo office and ask if you can temporarily increase your Lantus dose in anticipation of blood glucose elevation.  2. Find a dentist and make an appointment  for a cleaning and exam. [email or web address] 3. Continue logging your workouts into Live Life Well and once you have hit 60 workouts, claim the physical activity badge (under the main page, "healthy rewards program, year 3" in the upper right hand of the screen.     Next appointment to see me is: I will call you for a phone call August 11th to check in and see how you are doing.    Kentavious Michele D. Donneta Romberg, PharmD, BCPS, CDE Norm Parcel to [place] Coordinator [phone]

## 2015-11-02 DIAGNOSIS — M25561 Pain in right knee: Secondary | ICD-10-CM | POA: Diagnosis not present

## 2015-11-06 ENCOUNTER — Other Ambulatory Visit (HOSPITAL_COMMUNITY): Payer: Self-pay | Admitting: Orthopaedic Surgery

## 2015-11-06 DIAGNOSIS — M25561 Pain in right knee: Secondary | ICD-10-CM

## 2015-11-10 ENCOUNTER — Ambulatory Visit (HOSPITAL_COMMUNITY)
Admission: RE | Admit: 2015-11-10 | Discharge: 2015-11-10 | Disposition: A | Discharge status: Home / Self Care | Payer: 59 | Source: Ambulatory Visit | Attending: Orthopaedic Surgery | Admitting: Orthopaedic Surgery

## 2015-11-10 DIAGNOSIS — S83241A Other tear of medial meniscus, current injury, right knee, initial encounter: Secondary | ICD-10-CM | POA: Diagnosis not present

## 2015-11-10 DIAGNOSIS — M25561 Pain in right knee: Secondary | ICD-10-CM | POA: Insufficient documentation

## 2015-11-10 DIAGNOSIS — M7121 Synovial cyst of popliteal space [Baker], right knee: Secondary | ICD-10-CM | POA: Insufficient documentation

## 2015-11-10 DIAGNOSIS — X58XXXA Exposure to other specified factors, initial encounter: Secondary | ICD-10-CM | POA: Insufficient documentation

## 2015-12-02 DIAGNOSIS — S83241D Other tear of medial meniscus, current injury, right knee, subsequent encounter: Secondary | ICD-10-CM | POA: Diagnosis not present

## 2015-12-02 DIAGNOSIS — M25561 Pain in right knee: Secondary | ICD-10-CM | POA: Diagnosis not present

## 2015-12-02 MED FILL — glipiZIDE XL 10 MG TB24: 10 | 90 days supply | Qty: 90 | Fill #0

## 2015-12-02 MED FILL — LISINOPRIL 40 MG TABLET: 40 | 90 days supply | Qty: 90 | Fill #0

## 2015-12-02 MED FILL — PANTOPRAZOLE SOD DR 40 MG T: 40 | 90 days supply | Qty: 180 | Fill #0

## 2015-12-03 ENCOUNTER — Other Ambulatory Visit: Payer: Self-pay | Admitting: Physician Assistant

## 2015-12-03 NOTE — Patient Instructions (Addendum)
Carrington IOANNA GOUGE  12/03/2015   Your procedure is scheduled on: 12/10/15  Report to [facility] Main  Entrance take [facility]  elevators to 3rd floor to  [place] at 0730  AM.  Call this number if you have problems the morning of surgery [phone]   Remember: ONLY 1 PERSON MAY GO WITH YOU TO SHORT STAY TO GET  READY MORNING OF [place].  HAVE LIGHT, HEALTHY SNACK BEFORE MIDNIGHT  Do not eat food or drink liquids :After Midnight.     Take these medicines the morning of surgery with A SIP OF WATER: Pantoprazole DO NOT TAKE ANY DIABETIC MEDICATIONS DAY OF YOUR SURGERY                               You may not have any metal on your body including hair pins and              piercings  Do not wear jewelry, make-up, lotions, powders or perfumes, deodorant             Do not wear nail polish.  Do not shave  48 hours prior to surgery.              Men may shave face and neck.   Do not bring valuables to the hospital. [place].  Contacts, dentures or bridgework may not be worn into surgery.  Leave suitcase in the car. After surgery it may be brought to your room.     Patients discharged the day of surgery will not be allowed to drive home.  Name and phone number of your driver: husband Jeral Fruit [phone]  Special Instructions: N/A              Please read over the following fact sheets you were given: _____________________________________________________________________             [facility] - Preparing for Surgery Before surgery, you can play an important role.  Because skin is not sterile, your skin needs to be as free of germs as possible.  You can reduce the number of germs on your skin by washing with CHG (chlorahexidine gluconate) soap before surgery.  CHG is an antiseptic cleaner which kills germs and bonds with the skin to continue killing germs even after washing. Please DO NOT use if you have an  allergy to CHG or antibacterial soaps.  If your skin becomes reddened/irritated stop using the CHG and inform your nurse when you arrive at Short Stay. Do not shave (including legs and underarms) for at least 48 hours prior to the first CHG shower.  You may shave your face/neck. Please follow these instructions carefully:  1.  Shower with CHG Soap the night before surgery and the  morning of Surgery.  2.  If you choose to wash your hair, wash your hair first as usual with your  normal  shampoo.  3.  After you shampoo, rinse your hair and body thoroughly to remove the  shampoo.                           4.  Use CHG as you would any other liquid soap.  You can  apply chg directly  to the skin and wash                       Gently with a scrungie or clean washcloth.  5.  Apply the CHG Soap to your body ONLY FROM THE NECK DOWN.   Do not use on face/ open                           Wound or open sores. Avoid contact with eyes, ears mouth and genitals (private parts).                       Wash face,  Genitals (private parts) with your normal soap.             6.  Wash thoroughly, paying special attention to the area where your surgery  will be performed.  7.  Thoroughly rinse your body with warm water from the neck down.  8.  DO NOT shower/wash with your normal soap after using and rinsing off  the CHG Soap.                9.  Pat yourself dry with a clean towel.            10.  Wear clean pajamas.            11.  Place clean sheets on your bed the night of your first shower and do not  sleep with pets. Day of Surgery : Do not apply any lotions/deodorants the morning of surgery.  Please wear clean clothes to the hospital/surgery center.  FAILURE TO FOLLOW THESE INSTRUCTIONS MAY RESULT IN THE CANCELLATION OF YOUR SURGERY PATIENT SIGNATURE_________________________________  NURSE  SIGNATURE__________________________________  ________________________________________________________________________   Adam Phenix  An incentive spirometer is a tool that can help keep your lungs clear and active. This tool measures how well you are filling your lungs with each breath. Taking long deep breaths may help reverse or decrease the chance of developing breathing (pulmonary) problems (especially infection) following:  A long period of time when you are unable to move or be active. BEFORE THE PROCEDURE   If the spirometer includes an indicator to show your best effort, your nurse or respiratory therapist will set it to a desired goal.  If possible, sit up straight or lean slightly forward. Try not to slouch.  Hold the incentive spirometer in an upright position. INSTRUCTIONS FOR USE  1. Sit on the edge of your bed if possible, or sit up as far as you can in bed or on a chair. 2. Hold the incentive spirometer in an upright position. 3. Breathe out normally. 4. Place the mouthpiece in your mouth and seal your lips tightly around it. 5. Breathe in slowly and as deeply as possible, raising the piston or the ball toward the top of the column. 6. Hold your breath for 3-5 seconds or for as long as possible. Allow the piston or ball to fall to the bottom of the column. 7. Remove the mouthpiece from your mouth and breathe out normally. 8. Rest for a few seconds and repeat Steps 1 through 7 at least 10 times every 1-2 hours when you are awake. Take your time and take a few normal breaths between deep breaths. 9. The spirometer may include an indicator to show your best effort. Use the indicator as a goal to work toward during each  repetition. 10. After each set of 10 deep breaths, practice coughing to be sure your lungs are clear. If you have an incision (the cut made at the time of surgery), support your incision when coughing by placing a pillow or rolled up towels firmly  against it. Once you are able to get out of bed, walk around indoors and cough well. You may stop using the incentive spirometer when instructed by your caregiver.  RISKS AND COMPLICATIONS  Take your time so you do not get dizzy or light-headed.  If you are in pain, you may need to take or ask for pain medication before doing incentive spirometry. It is harder to take a deep breath if you are having pain. AFTER USE  Rest and breathe slowly and easily.  It can be helpful to keep track of a log of your progress. Your caregiver can provide you with a simple table to help with this. If you are using the spirometer at home, follow these instructions: Pulcifer IF:   You are having difficultly using the spirometer.  You have trouble using the spirometer as often as instructed.  Your pain medication is not giving enough relief while using the spirometer.  You develop fever of 100.5 F (38.1 C) or higher. SEEK IMMEDIATE MEDICAL CARE IF:   You cough up bloody sputum that had not been present before.  You develop fever of 102 F (38.9 C) or greater.  You develop worsening pain at or near the incision site. MAKE SURE YOU:   Understand these instructions.  Will watch your condition.  Will get help right away if you are not doing well or get worse. Document Released: 08/29/2006 Document Revised: 07/11/2011 Document Reviewed: 10/30/2006 [facility] Patient Information 2014 [place], [place].   ________________________________________________________________________

## 2015-12-04 ENCOUNTER — Encounter (HOSPITAL_COMMUNITY)
Admission: RE | Admit: 2015-12-04 | Discharge: 2015-12-04 | Disposition: A | Discharge status: Home / Self Care | Payer: 59 | Source: Ambulatory Visit | Attending: Orthopaedic Surgery | Admitting: Orthopaedic Surgery

## 2015-12-04 ENCOUNTER — Encounter (HOSPITAL_COMMUNITY): Payer: Self-pay | Admitting: *Deleted

## 2015-12-04 DIAGNOSIS — E119 Type 2 diabetes mellitus without complications: Secondary | ICD-10-CM | POA: Diagnosis not present

## 2015-12-04 DIAGNOSIS — I10 Essential (primary) hypertension: Secondary | ICD-10-CM | POA: Diagnosis not present

## 2015-12-04 HISTORY — DX: Essential (primary) hypertension: I10

## 2015-12-04 HISTORY — DX: Gastro-esophageal reflux disease without esophagitis: K21.9

## 2015-12-04 LAB — CBC
HCT: 34.5 % — ABNORMAL LOW (ref 36.0–46.0)
Hemoglobin: 11.5 g/dL — ABNORMAL LOW (ref 12.0–15.0)
MCH: 26.3 pg (ref 26.0–34.0)
MCHC: 33.3 g/dL (ref 30.0–36.0)
MCV: 78.9 fL (ref 78.0–100.0)
Platelets: 179 10*3/uL (ref 150–400)
RBC: 4.37 MIL/uL (ref 3.87–5.11)
RDW: 14.8 % (ref 11.5–15.5)
WBC: 7.9 10*3/uL (ref 4.0–10.5)

## 2015-12-04 LAB — BASIC METABOLIC PANEL
Anion gap: 10 (ref 5–15)
BUN: 12 mg/dL (ref 6–20)
CO2: 23 mmol/L (ref 22–32)
Calcium: 9.7 mg/dL (ref 8.9–10.3)
Chloride: 97 mmol/L — ABNORMAL LOW (ref 101–111)
Creatinine, Ser: 0.8 mg/dL (ref 0.44–1.00)
GFR calc Af Amer: >60 mL/min (ref >60)
GFR calc non Af Amer: >60 mL/min (ref >60)
Glucose, Bld: 241 mg/dL — ABNORMAL HIGH (ref 65–99)
Potassium: 4.6 mmol/L (ref 3.5–5.1)
Sodium: 130 mmol/L — ABNORMAL LOW (ref 135–145)

## 2015-12-04 MED FILL — HYDROCHLOROTHIAZIDE 25 MG T: 25 | 90 days supply | Qty: 90 | Fill #0

## 2015-12-05 LAB — HEMOGLOBIN A1C
Hgb A1c MFr Bld: 9.2 % — ABNORMAL HIGH (ref 4.8–5.6)
Mean Plasma Glucose: 217 mg/dL

## 2015-12-10 ENCOUNTER — Ambulatory Visit (HOSPITAL_COMMUNITY): Payer: 59 | Admitting: Anesthesiology

## 2015-12-10 ENCOUNTER — Encounter (HOSPITAL_COMMUNITY)
Admission: RE | Disposition: A | Discharge status: Home / Self Care | Payer: Self-pay | Source: Ambulatory Visit | Attending: Orthopaedic Surgery

## 2015-12-10 ENCOUNTER — Ambulatory Visit (HOSPITAL_COMMUNITY)
Admission: RE | Admit: 2015-12-10 | Discharge: 2015-12-10 | Disposition: A | Discharge status: Home / Self Care | Payer: 59 | Source: Ambulatory Visit | Attending: Orthopaedic Surgery | Admitting: Orthopaedic Surgery

## 2015-12-10 ENCOUNTER — Encounter (HOSPITAL_COMMUNITY): Payer: Self-pay

## 2015-12-10 DIAGNOSIS — X58XXXA Exposure to other specified factors, initial encounter: Secondary | ICD-10-CM | POA: Diagnosis not present

## 2015-12-10 DIAGNOSIS — I10 Essential (primary) hypertension: Secondary | ICD-10-CM | POA: Diagnosis not present

## 2015-12-10 DIAGNOSIS — K219 Gastro-esophageal reflux disease without esophagitis: Secondary | ICD-10-CM | POA: Diagnosis not present

## 2015-12-10 DIAGNOSIS — S83241D Other tear of medial meniscus, current injury, right knee, subsequent encounter: Secondary | ICD-10-CM | POA: Diagnosis not present

## 2015-12-10 DIAGNOSIS — S83241A Other tear of medial meniscus, current injury, right knee, initial encounter: Secondary | ICD-10-CM | POA: Diagnosis not present

## 2015-12-10 DIAGNOSIS — E119 Type 2 diabetes mellitus without complications: Secondary | ICD-10-CM | POA: Insufficient documentation

## 2015-12-10 DIAGNOSIS — Z7982 Long term (current) use of aspirin: Secondary | ICD-10-CM | POA: Insufficient documentation

## 2015-12-10 DIAGNOSIS — M94261 Chondromalacia, right knee: Secondary | ICD-10-CM | POA: Insufficient documentation

## 2015-12-10 DIAGNOSIS — Z79899 Other long term (current) drug therapy: Secondary | ICD-10-CM | POA: Insufficient documentation

## 2015-12-10 DIAGNOSIS — Z794 Long term (current) use of insulin: Secondary | ICD-10-CM | POA: Diagnosis not present

## 2015-12-10 HISTORY — PX: KNEE ARTHROSCOPY: SHX127

## 2015-12-10 HISTORY — DX: Other tear of medial meniscus, current injury, right knee, initial encounter: S83.241A

## 2015-12-10 LAB — GLUCOSE, CAPILLARY
Glucose-Capillary: 294 mg/dL — ABNORMAL HIGH (ref 65–99)
Glucose-Capillary: 230 mg/dL — ABNORMAL HIGH (ref 65–99)
Glucose-Capillary: 202 mg/dL — ABNORMAL HIGH (ref 65–99)

## 2015-12-10 SURGERY — ARTHROSCOPY, KNEE
Anesthesia: General | Site: Knee | Laterality: Right

## 2015-12-10 MED ORDER — MORPHINE SULFATE (PF) 4 MG/ML IV SOLN
INTRAVENOUS | Status: DC | PRN
  Administered 2015-12-10: 4 mg

## 2015-12-10 MED ORDER — CLINDAMYCIN PHOSPHATE 900 MG/50ML IV SOLN
INTRAVENOUS | Status: AC
  Filled 2015-12-10: qty 50

## 2015-12-10 MED ORDER — FENTANYL CITRATE (PF) 100 MCG/2ML IJ SOLN
INTRAMUSCULAR | Status: DC | PRN
  Administered 2015-12-10: 50 ug via INTRAVENOUS

## 2015-12-10 MED ORDER — PROMETHAZINE HCL 25 MG/ML IJ SOLN: 6.2500 mg | INTRAMUSCULAR | Status: DC | PRN

## 2015-12-10 MED ORDER — MIDAZOLAM HCL 5 MG/5ML IJ SOLN
INTRAMUSCULAR | Status: DC | PRN
  Administered 2015-12-10: 2 mg via INTRAVENOUS

## 2015-12-10 MED ORDER — KETOROLAC TROMETHAMINE 30 MG/ML IJ SOLN: 30.0000 mg | Freq: Once | INTRAMUSCULAR | Status: DC | PRN

## 2015-12-10 MED ORDER — OXYCODONE HCL 5 MG/5ML PO SOLN
5.0000 mg | Freq: Once | ORAL | Status: DC | PRN
  Filled 2015-12-10: qty 5

## 2015-12-10 MED ORDER — PROPOFOL 10 MG/ML IV BOLUS
INTRAVENOUS | Status: DC | PRN
  Administered 2015-12-10: 140 mg via INTRAVENOUS

## 2015-12-10 MED ORDER — HYDROCODONE-ACETAMINOPHEN 5-325 MG PO TABS: 1.0000 | ORAL_TABLET | ORAL | 0 refills | Status: DC | PRN

## 2015-12-10 MED ORDER — MIDAZOLAM HCL 2 MG/2ML IJ SOLN
INTRAMUSCULAR | Status: AC
  Filled 2015-12-10: qty 2

## 2015-12-10 MED ORDER — INSULIN ASPART 100 UNIT/ML ~~LOC~~ SOLN
10.0000 [IU] | Freq: Once | SUBCUTANEOUS | Status: AC
  Administered 2015-12-10: 10 [IU] via SUBCUTANEOUS
  Filled 2015-12-10: qty 1

## 2015-12-10 MED ORDER — OXYCODONE HCL 5 MG PO TABS: 5.0000 mg | ORAL_TABLET | Freq: Once | ORAL | Status: DC | PRN

## 2015-12-10 MED ORDER — CHLORHEXIDINE GLUCONATE 4 % EX LIQD: 60.0000 mL | Freq: Once | CUTANEOUS | Status: DC

## 2015-12-10 MED ORDER — FENTANYL CITRATE (PF) 100 MCG/2ML IJ SOLN
25.0000 ug | INTRAMUSCULAR | Status: DC | PRN
Start: 2015-12-10 — End: 2015-12-10

## 2015-12-10 MED ORDER — CLINDAMYCIN PHOSPHATE 900 MG/50ML IV SOLN
900.0000 mg | INTRAVENOUS | Status: AC
  Administered 2015-12-10: 900 mg via INTRAVENOUS

## 2015-12-10 MED ORDER — BUPIVACAINE HCL (PF) 0.5 % IJ SOLN
INTRAMUSCULAR | Status: DC | PRN
  Administered 2015-12-10: 30 mL

## 2015-12-10 MED ORDER — FENTANYL CITRATE (PF) 100 MCG/2ML IJ SOLN
INTRAMUSCULAR | Status: AC
  Filled 2015-12-10: qty 2

## 2015-12-10 MED ORDER — LIDOCAINE HCL (CARDIAC) 20 MG/ML IV SOLN
INTRAVENOUS | Status: DC | PRN
  Administered 2015-12-10: 50 mg via INTRAVENOUS

## 2015-12-10 MED ORDER — PROPOFOL 10 MG/ML IV BOLUS
INTRAVENOUS | Status: AC
  Filled 2015-12-10: qty 20

## 2015-12-10 MED ORDER — BUPIVACAINE HCL (PF) 0.25 % IJ SOLN
INTRAMUSCULAR | Status: AC
  Filled 2015-12-10: qty 30

## 2015-12-10 MED ORDER — MORPHINE SULFATE (PF) 4 MG/ML IV SOLN
INTRAVENOUS | Status: AC
  Filled 2015-12-10: qty 1

## 2015-12-10 MED ORDER — LACTATED RINGERS IV SOLN
INTRAVENOUS | Status: DC
  Administered 2015-12-10: 10:00:00 via INTRAVENOUS
  Administered 2015-12-10: 1000 mL via INTRAVENOUS

## 2015-12-10 MED ORDER — PHENYLEPHRINE HCL 10 MG/ML IJ SOLN
INTRAMUSCULAR | Status: DC | PRN
  Administered 2015-12-10: 120 ug via INTRAVENOUS
  Administered 2015-12-10: 80 ug via INTRAVENOUS

## 2015-12-10 MED ORDER — ONDANSETRON HCL 4 MG/2ML IJ SOLN
INTRAMUSCULAR | Status: DC | PRN
  Administered 2015-12-10: 4 mg via INTRAVENOUS

## 2015-12-10 MED FILL — HYDROCODON-APAP 5-325: 5-325 | 5 days supply | Qty: 60 | Fill #0

## 2015-12-10 SURGICAL SUPPLY — 27 items
BANDAGE ACE 6X5 VEL STRL LF (GAUZE/BANDAGES/DRESSINGS) ×2 IMPLANT
BLADE CUDA SHAVER 3.5 (BLADE) ×2 IMPLANT
CUFF TOURN SGL QUICK 34 (TOURNIQUET CUFF) ×1
CUFF TRNQT CYL 34X4X40X1 (TOURNIQUET CUFF) ×1 IMPLANT
DRAPE U-SHAPE 47X51 STRL (DRAPES) ×2 IMPLANT
DRSG PAD ABDOMINAL 8X10 ST (GAUZE/BANDAGES/DRESSINGS) ×2 IMPLANT
DURAPREP 26ML APPLICATOR (WOUND CARE) ×2 IMPLANT
GAUZE SPONGE 4X4 12PLY STRL (GAUZE/BANDAGES/DRESSINGS) ×2 IMPLANT
GAUZE XEROFORM 1X8 LF (GAUZE/BANDAGES/DRESSINGS) ×2 IMPLANT
GLOVE BIO SURGEON STRL SZ7.5 (GLOVE) ×2 IMPLANT
GLOVE BIOGEL PI IND STRL 8 (GLOVE) ×2 IMPLANT
GLOVE BIOGEL PI INDICATOR 8 (GLOVE) ×2
GLOVE ECLIPSE 8.0 STRL XLNG CF (GLOVE) ×2 IMPLANT
GOWN STRL REUS W/TWL XL LVL3 (GOWN DISPOSABLE) ×4 IMPLANT
IV LACTATED RINGER IRRG 3000ML (IV SOLUTION) ×2
IV LR IRRIG 3000ML ARTHROMATIC (IV SOLUTION) ×2 IMPLANT
KIT BASIN OR (CUSTOM PROCEDURE TRAY) ×2 IMPLANT
MANIFOLD NEPTUNE II (INSTRUMENTS) ×2 IMPLANT
NEEDLE HYPO 22GX1.5 SAFETY (NEEDLE) ×2 IMPLANT
PACK ARTHROSCOPY WL (CUSTOM PROCEDURE TRAY) ×2 IMPLANT
PADDING CAST COTTON 6X4 STRL (CAST SUPPLIES) ×2 IMPLANT
SUT ETHILON 4 0 PS 2 18 (SUTURE) ×2 IMPLANT
SYR CONTROL 10ML LL (SYRINGE) ×2 IMPLANT
TOWEL OR 17X26 10 PK STRL BLUE (TOWEL DISPOSABLE) ×2 IMPLANT
TUBING ARTHRO INFLOW-ONLY STRL (TUBING) ×2 IMPLANT
WAND HAND CNTRL MULTIVAC 90 (MISCELLANEOUS) IMPLANT
WRAP KNEE MAXI GEL POST OP (GAUZE/BANDAGES/DRESSINGS) ×2 IMPLANT

## 2015-12-10 NOTE — Anesthesia Postprocedure Evaluation (Signed)
Anesthesia Post Note  Patient: Meagan Peck  Procedure(s) Performed: Procedure(s) (LRB): RIGHT KNEE ARTHROSCOPY WITH PARTIAL MEDIAL MENISCECTOMY (Right)  Patient location during evaluation: PACU Anesthesia Type: General Level of consciousness: awake and alert Pain management: pain level controlled Vital Signs Assessment: post-procedure vital signs reviewed and stable Respiratory status: spontaneous breathing, nonlabored ventilation, respiratory function stable and patient connected to nasal cannula oxygen Cardiovascular status: blood pressure returned to baseline and stable Postop Assessment: no signs of nausea or vomiting Anesthetic complications: no    Last Vitals:  Vitals:   12/10/15 0715  BP: 133/76  Pulse: 79  Resp: 18  Temp: 36.7 C    Last Pain:  Vitals:   12/10/15 1034  TempSrc:   PainSc: 0-No pain                 Clydene Burack S

## 2015-12-10 NOTE — Brief Op Note (Signed)
12/10/2015  10:22 AM  PATIENT:  Ophia P Dotter  55 y.o. female  PRE-OPERATIVE DIAGNOSIS:  right knee medial meniscal tear  POST-OPERATIVE DIAGNOSIS:  right knee medial meniscal tear  PROCEDURE:  Procedure(s): RIGHT KNEE ARTHROSCOPY WITH PARTIAL MEDIAL MENISCECTOMY (Right)  SURGEON:  Surgeon(s) and Role:    * Mcarthur Rossetti, MD - Primary  PHYSICIAN ASSISTANT: Benita Stabile, PA-C  ANESTHESIA:   local and general  EBL:  Total I/O In: 1000 [I.V.:1000] Out: 25 [Blood:25]  LOCAL MEDICATIONS USED:  MARCAINE    and OTHER Morphine  DICTATION: .Other Dictation: Dictation Number [id number]  PLAN OF CARE: Discharge to home after PACU  PATIENT DISPOSITION:  PACU - hemodynamically stable.   Delay start of Pharmacological VTE agent (>24hrs) due to surgical blood loss or risk of bleeding: not applicable

## 2015-12-10 NOTE — H&P (Signed)
Meagan Peck is an 55 y.o. female.   Chief Complaint:   Right knee pain with swelling, locking, and catching HPI:   55 yo female with right knee pain and a symptomatic medial meniscal tear confirmed by physical exam and MRI.  Failed conservative treatment.  A knee arthroscopy has been recommended at this point.  Past Medical History:  Diagnosis Date  . Diabetes mellitus without complication ([place])   . GERD (gastroesophageal reflux disease)   . Hypertension     Past Surgical History:  Procedure Laterality Date  . CARPAL TUNNEL RELEASE     right hand  . CESAREAN SECTION    . CHOLECYSTECTOMY    . KNEE ARTHROSCOPY W/ MENISCAL REPAIR     left knee    No family history on file. Social History:  reports that she has never smoked. She has never used smokeless tobacco. She reports that she does not drink alcohol or use drugs.  Allergies:  Allergies  Allergen Reactions  . Penicillins Anaphylaxis    Happened as a young child  . Naproxen Rash  . Quinine Derivatives Rash  . Sulfa Antibiotics Rash    Medications Prior to Admission  Medication Sig Dispense Refill  . aspirin 81 MG tablet Take 81 mg by mouth daily.    . Cholecalciferol (VITAMIN D3) 5000 UNITS TABS Take 5,000 Units by mouth daily.     Marland Kitchen exenatide (BYETTA) 5 MCG/0.02ML SOLN Inject 5 mcg into the skin 2 (two) times daily with a meal.     . glipiZIDE (GLUCOTROL XL) 10 MG 24 hr tablet Take 10 mg by mouth daily. Take 1 tablet daily if fasting blood sugar is greater than 100.    . hydrochlorothiazide (HYDRODIURIL) 25 MG tablet Take 25 mg by mouth daily.    . insulin glargine (LANTUS) 100 UNIT/ML injection Inject 14 Units into the skin daily.     Javier Docker Oil 500 MG CAPS Take 500 mg by mouth daily.    Marland Kitchen lisinopril (PRINIVIL,ZESTRIL) 40 MG tablet Take 40 mg by mouth daily.    . metFORMIN (GLUCOPHAGE) 1000 MG tablet Take 1,000 mg by mouth 2 (two) times daily with a meal.    . pantoprazole (PROTONIX) 40 MG tablet Take 40 mg by  mouth 2 (two) times daily.    . rosuvastatin (CRESTOR) 20 MG tablet Take 20 mg by mouth 3 (three) times a week.      Results for orders placed or performed during the hospital encounter of 12/10/15 (from the past 48 hour(s))  Glucose, capillary     Status: Abnormal   Collection Time: 12/10/15  7:12 AM  Result Value Ref Range   Glucose-Capillary 294 (H) 65 - 99 mg/dL   Comment 1 Notify RN    No results found.  Review of Systems  Musculoskeletal: Positive for joint pain.  All other systems reviewed and are negative.   Blood pressure 133/76, pulse 79, temperature 98 F (36.7 C), temperature source Oral, resp. rate 18, height 4' 11"  (1.499 m), weight 72.6 kg (160 lb), last menstrual period 12/03/2013, SpO2 100 %. Physical Exam  Constitutional: She is oriented to person, place, and time. She appears well-developed and well-nourished.  HENT:  Head: Normocephalic and atraumatic.  Eyes: EOM are normal. Pupils are equal, round, and reactive to light.  Neck: Normal range of motion. Neck supple.  Cardiovascular: Normal rate and regular rhythm.   Respiratory: Effort normal and breath sounds normal.  GI: Soft. Bowel sounds are normal.  Musculoskeletal:  Right knee: She exhibits swelling and abnormal meniscus. Tenderness found. Medial joint line tenderness noted.  Neurological: She is alert and oriented to person, place, and time.  Skin: Skin is warm and dry.  Psychiatric: She has a normal mood and affect.     Assessment/Plan Acute medial mensicus tear right knee 1)  To the OR today for a right knee arthroscopy.  Mcarthur Rossetti, MD 12/10/2015, 7:23 AM

## 2015-12-10 NOTE — Op Note (Signed)
NAMEMELORA, Meagan Peck               ACCOUNT NO.:  [id number]  MEDICAL RECORD NO.:  [id number]  LOCATION:  [facility]                         FACILITY:  [facility]  PHYSICIAN:  Lind Guest. Ninfa Linden, M.D.DATE OF BIRTH:  March 26, 1961  DATE OF PROCEDURE:  12/10/2015 DATE OF DISCHARGE:                              OPERATIVE REPORT   PREOPERATIVE DIAGNOSIS:  Right knee posterior horn medial meniscal tear.  POSTOPERATIVE DIAGNOSES: 1. Right knee posterior horn medial meniscal tear. 2. 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  PROCEDURE:  Right knee arthroscopy with debridement and partial medial meniscectomy.  SURGEON:  Lind Guest. Ninfa Linden, M.D.  ASSISTANT:  Erskine Emery, PA-C.  ANESTHESIA: 1. General. 2. Local with mixture of plain Marcaine and morphine.  BLOOD LOSS:  Minimal.  COMPLICATIONS:  None.  INDICATIONS:  Meagan Peck is a 55 year old patient well known to me. She had acute right knee meniscal tear involving the posterior horn and medial root of the meniscus.  She has continued to have symptoms from this and has failed all forms of conservative treatment.  At this point, an arthroscopic intervention has been recommended and warranted.  She does wish to proceed with this given the catching and locking that she is having in her knee and continued mechanical symptoms.  The risks and benefits of the surgery were explained to her in detail and she did wish to proceed.  PROCEDURE DESCRIPTION:  After informed consent was obtained, appropriate right knee was marked.  She was brought to the operating room and placed supine on the operating table.  General anesthesia was obtained.  Her right leg was prepped and draped from the thigh down the ankle with DuraPrep and sterile drapes including the sterile stockinette.  With the bed raised and a lateral leg post utilized, the right operative knee was flexed off the side of the table.  She was identified as the correct patient  and correct right knee.  I then made an anterolateral arthroscopy portal and entered the knee joint with the camera.  I did not find any significant effusion.  We went to the medial compartment and surprisingly found an area of significant cartilage loss on the medial femoral condyle that was not identified on her plain films or MRI.  We used an anteromedial portal and placed our arthroscopic shaver to perform a chondroplasty over the wide area of the medial femoral condyle.  The medial tibial plateau looked good.  There was a meniscal root tear that we debrided back to a stable margin.  ACL and PCL were intact, with the knee in a figure 4 position.  Her lateral side was pristine.  Finally, the patellofemoral joint was assessed and there was minimal cartilage changes on the patella and trochlear groove.  We allowed fluid to lavage the knee and then drained all fluid from the knee.  We closed the portal sites with interrupted nylon suture.  We placed a mixture of morphine and Marcaine into the knee.  Xeroform and well-padded sterile dressing were applied.  She was awakened, extubated and taken to the recovery room in stable condition.  All final counts were correct.  There were no complications noted.  Postoperatively, we will  allow her to slowly increase her activities with weightbearing as tolerated.  She will be discharged home from the PACU and follow up in the office in a week.     Lind Guest. Ninfa Linden, M.D.     CYB/MEDQ  D:  12/10/2015  T:  12/10/2015  Job:  [id number]

## 2015-12-10 NOTE — Anesthesia Preprocedure Evaluation (Signed)
Anesthesia Evaluation  Patient identified by MRN, date of birth, ID band Patient awake    Reviewed: Allergy & Precautions, NPO status , [name] Chart, lab work & pertinent test results  Airway Mallampati: II  TM Distance: >3 FB Neck ROM: Full    Dental no notable dental hx.    Pulmonary neg pulmonary ROS,    Pulmonary exam normal breath sounds clear to auscultation       Cardiovascular hypertension, Normal cardiovascular exam Rhythm:Regular Rate:Normal     Neuro/Psych negative neurological ROS  negative psych ROS   GI/Hepatic negative GI ROS, Neg liver ROS,   Endo/Other  diabetes, Insulin Dependent  Renal/GU negative Renal ROS  negative genitourinary   Musculoskeletal negative musculoskeletal ROS (+)   Abdominal   Peds negative pediatric ROS (+)  Hematology negative hematology ROS (+)   Anesthesia Other Findings   Reproductive/Obstetrics negative OB ROS                             Anesthesia Physical Anesthesia Plan  ASA: III  Anesthesia Plan: General   Post-op Pain Management:    Induction: Intravenous  Airway Management Planned: LMA  Additional Equipment:   Intra-op Plan:   Post-operative Plan: Extubation in OR  Informed Consent: I have reviewed the patients History and Physical, chart, labs and discussed the procedure including the risks, benefits and alternatives for the proposed anesthesia with the patient or authorized representative who has indicated his/her understanding and acceptance.   Dental advisory given  Plan Discussed with: CRNA and Surgeon  Anesthesia Plan Comments:         Anesthesia Quick Evaluation

## 2015-12-10 NOTE — Transfer of Care (Signed)
Immediate Anesthesia Transfer of Care Note  Patient: Meagan Peck  Procedure(s) Performed: Procedure(s): RIGHT KNEE ARTHROSCOPY WITH PARTIAL MEDIAL MENISCECTOMY (Right)  Patient Location: PACU  Anesthesia Type:General  Level of Consciousness: sedated, patient cooperative and responds to stimulation  Airway & Oxygen Therapy: Patient Spontanous Breathing and Patient connected to face mask oxygen  Post-op Assessment: Report given to RN and Post -op Vital signs reviewed and stable  Post vital signs: Reviewed and stable  Last Vitals:  Vitals:   12/10/15 0715  BP: 133/76  Pulse: 79  Resp: 18  Temp: 36.7 C    Last Pain:  Vitals:   12/10/15 0837  TempSrc:   PainSc: 4       Patients Stated Pain Goal: 4 ([id number])  Complications: No apparent anesthesia complications

## 2015-12-10 NOTE — Anesthesia Procedure Notes (Signed)
Procedure Name: LMA Insertion Performed by: Gean Maidens Pre-anesthesia Checklist: Patient identified, Emergency Drugs available, Suction available, Patient being monitored and Timeout performed Patient Re-evaluated:Patient Re-evaluated prior to inductionOxygen Delivery Method: Circle system utilized Preoxygenation: Pre-oxygenation with 100% oxygen Intubation Type: IV induction Ventilation: Mask ventilation without difficulty LMA: LMA inserted LMA Size: 4.0 Placement Confirmation: positive ETCO2,  CO2 detector and breath sounds checked- equal and bilateral Tube secured with: Tape Dental Injury: Teeth and Oropharynx as per pre-operative assessment

## 2015-12-10 NOTE — Discharge Instructions (Signed)
You may increase your activities as comfort allows. You may put full weight on your right leg. Ice and elevation occasionally for swelling. You can remove your dressings tomorrow 8/11 and get your incisions wet in the shower. Place band-aids daily over your incisions after your shower. Pump your feet occasionally throughout the day. You can drive when comfort allows.    Knee Arthroscopy, Care After Refer to this sheet in the next few weeks. These instructions provide you with information about caring for yourself after your procedure. Your health care provider may also give you more specific instructions. Your treatment has been planned according to current medical practices, but problems sometimes occur. Call your health care provider if you have any problems or questions after your procedure. WHAT TO EXPECT AFTER THE PROCEDURE After your procedure, it is common to have:  Soreness.  Pain. HOME CARE INSTRUCTIONS Bathing  Do not take baths, swim, or use a hot tub until your health care provider approves. Incision Care  There are many different ways to close and cover an incision, including stitches, skin glue, and adhesive strips. Follow your health care provider's instructions about:  Incision care.  Bandage (dressing) changes and removal.  Incision closure removal.  Check your incision area every day for signs of infection. Watch for:  Redness, swelling, or pain.  Fluid, blood, or pus. Activity  Avoid strenuous activities for as long as directed by your health care provider.  Return to your normal activities as directed by your health care provider. Ask your health care provider what activities are safe for you.  Perform range-of-motion exercises only as directed by your health care provider.  Do not lift anything that is heavier than 10 lb (4.5 kg).  Do not drive or operate heavy machinery while taking pain medicine.  If you were given crutches, use them as directed  by your health care provider. Managing Pain, Stiffness, and Swelling  If directed, apply ice to the injured area:  Put ice in a plastic bag.  Place a towel between your skin and the bag.  Leave the ice on for 20 minutes, 2-3 times per day.  Raise the injured area above the level of your heart while you are sitting or lying down as directed by your health care provider. General Instructions  Keep all follow-up visits as directed by your health care provider. This is important.  Take medicines only as directed by your health care provider.  Do not use any tobacco products, including cigarettes, chewing tobacco, or electronic cigarettes. If you need help quitting, ask your health care provider.  If you were given compression stockings, wear them as directed by your health care provider. These stockings help prevent blood clots and reduce swelling in your legs. SEEK MEDICAL CARE IF:  You have severe pain with any movement of your knee.  You notice a bad smell coming from the incision or dressing.  You have redness, swelling, or pain at the site of your incision.  You have fluid, blood, or pus coming from your incision. SEEK IMMEDIATE MEDICAL CARE IF:  You develop a rash.  You have a fever.  You have difficulty breathing or have shortness of breath.  You develop pain in your calves or in the back of your knee.  You develop chest pain.  You develop numbness or tingling in your leg or foot.   This information is not intended to replace advice given to you by your health care provider. Make sure you discuss  any questions you have with your health care provider.   Document Released: 11/05/2004 Document Revised: 09/02/2014 Document Reviewed: 04/14/2014 Elsevier Interactive Patient Education 2016 [place] Anesthesia, Adult, Care After Refer to this sheet in the next few weeks. These instructions provide you with information on caring for yourself after your  procedure. Your health care provider may also give you more specific instructions. Your treatment has been planned according to current medical practices, but problems sometimes occur. Call your health care provider if you have any problems or questions after your procedure. WHAT TO EXPECT AFTER THE PROCEDURE After the procedure, it is typical to experience:  Sleepiness.  Nausea and vomiting. HOME CARE INSTRUCTIONS  For the first 24 hours after general anesthesia:  Have a responsible person with you.  Do not drive a car. If you are alone, do not take public transportation.  Do not drink alcohol.  Do not take medicine that has not been prescribed by your health care provider.  Do not sign important papers or make important decisions.  You may resume a normal diet and activities as directed by your health care provider.  Change bandages (dressings) as directed.  If you have questions or problems that seem related to general anesthesia, call the hospital and ask for the anesthetist or anesthesiologist on call. SEEK MEDICAL CARE IF:  You have nausea and vomiting that continue the day after anesthesia.  You develop a rash. SEEK IMMEDIATE MEDICAL CARE IF:   You have difficulty breathing.  You have chest pain.  You have any allergic problems.   This information is not intended to replace advice given to you by your health care provider. Make sure you discuss any questions you have with your health care provider.   Document Released: 07/25/2000 Document Revised: 05/09/2014 Document Reviewed: 08/17/2011 Elsevier Interactive Patient Education [employer].

## 2015-12-11 ENCOUNTER — Other Ambulatory Visit: Payer: Self-pay | Admitting: Pharmacist

## 2015-12-11 ENCOUNTER — Encounter (HOSPITAL_COMMUNITY): Payer: Self-pay | Admitting: Orthopaedic Surgery

## 2015-12-11 NOTE — Patient Outreach (Signed)
Left message for patient to call me back. No answer by the end of the day. I will attempt again next week.

## 2015-12-14 ENCOUNTER — Other Ambulatory Visit: Payer: Self-pay | Admitting: Pharmacist

## 2015-12-14 ENCOUNTER — Encounter: Payer: Self-pay | Admitting: Pharmacist

## 2015-12-14 NOTE — Patient Outreach (Signed)
Subjective:  I called patient today for 6 week telephone diabetes follow-up as part of the employer-sponsored Link to Wellness program.  Current diabetes regimen includes Lantus 14 units SQ once daily, glipizide XL 10 mg if FBG >100 mg/dL, Byetta 5 mcg SQ BID and metformin 1000 mg BID.  Patient also continues on daily ASA, ACE Inhibitor and statin.  Most recent MD follow-up was with Dr. Colin Rhein in June and A1C was 8%. Patient has a pending appt for the end of this month. No med changes at this time.   Patient had surgery on her knee last Thursday to repair a tear of the meniscus.    Assessment:  Diabetes: Most recent A1C was 8.0  % which is exceeding goal of less than 7%.   CBG Review: Patient states she is checking CBG once daily in the morning. Per patient report fasting CBGs have been running from 130-180 in the past week. Patient had surgery last week on her knee and reports CBGs have been higher. Previous to the surgery fasting was running in the low 100s.   Patient has been taking glipizide XL daily (only takes when fasting CBG >100).   Denies hypoglycemia. Per patient report- high/low 186/119  Lifestyle improvements:  Physical Activity-  Patient was walking 30 minutes 5-6 days a week prior to surgery.   Patient has been logging some of her physical activity, but not all.   Nutrition-  Patient reports no major changes and states she is eating about the same.    Follow up with me in 4 months when I return from maternity leave.    Plan/Goals for Next Visit:  1. Once you are cleared for physical activity from your surgeon, start walking again 3 days a week and work up to the amount that you were walking previously. Increase duration and intensity as you are able.  2. Choose a dentist and get in for a cleaning and an exam before your next appointment with me.  3. Once you get back into walking, start logging your physical activity into the [employer] website so you can make  it to 60 workouts and claim the badge.    Next appointment to see me is: Monday December 18th at 4 PM.    Jinny Blossom D. Donneta Romberg, PharmD, BCPS, CDE [facility] to [place] Coordinator [phone]

## 2015-12-24 MED FILL — metFORMIN HCL 1000 MG TABS: 1000 | 90 days supply | Qty: 180 | Fill #2

## 2015-12-24 MED FILL — UNIFINE PENTIPS 8MM 31G: 31G X 8 MM | 90 days supply | Qty: 300 | Fill #0

## 2015-12-24 MED FILL — TRUE METRIX GLUCOSE TEST ST: 90 days supply | Qty: 100 | Fill #1

## 2015-12-24 MED FILL — ROSUVASTATIN CALCIUM 20 MG: 20 | 90 days supply | Qty: 90 | Fill #1

## 2016-01-21 MED FILL — LANTUS SOLOSTAR 100 UNITS/M: 100 | 75 days supply | Qty: 15 | Fill #1

## 2016-01-21 MED FILL — BYETTA 5 MCG DOSE PEN INJ: 5 | 90 days supply | Qty: 4 | Fill #2

## 2016-03-02 MED FILL — glipiZIDE XL 10 MG TB24: 10 | 90 days supply | Qty: 90 | Fill #1

## 2016-03-02 MED FILL — HYDROCHLOROTHIAZIDE 25 MG T: 25 | 90 days supply | Qty: 90 | Fill #1

## 2016-03-02 MED FILL — PANTOPRAZOLE SOD DR 40 MG T: 40 | 90 days supply | Qty: 180 | Fill #1

## 2016-03-02 MED FILL — LISINOPRIL 40 MG TABLET: 40 | 90 days supply | Qty: 90 | Fill #1

## 2016-03-23 MED FILL — metFORMIN HCL 1000 MG TABS: 1000 | 90 days supply | Qty: 180 | Fill #3

## 2016-03-29 MED FILL — TRUE METRIX GLUCOSE TEST ST: 90 days supply | Qty: 100 | Fill #2

## 2016-04-08 DIAGNOSIS — E119 Type 2 diabetes mellitus without complications: Secondary | ICD-10-CM | POA: Diagnosis not present

## 2016-04-08 DIAGNOSIS — I10 Essential (primary) hypertension: Secondary | ICD-10-CM | POA: Diagnosis not present

## 2016-04-14 DIAGNOSIS — E119 Type 2 diabetes mellitus without complications: Secondary | ICD-10-CM | POA: Diagnosis not present

## 2016-04-14 DIAGNOSIS — I10 Essential (primary) hypertension: Secondary | ICD-10-CM | POA: Diagnosis not present

## 2016-04-14 DIAGNOSIS — E785 Hyperlipidemia, unspecified: Secondary | ICD-10-CM | POA: Diagnosis not present

## 2016-04-14 DIAGNOSIS — Z794 Long term (current) use of insulin: Secondary | ICD-10-CM | POA: Diagnosis not present

## 2016-04-14 DIAGNOSIS — D696 Thrombocytopenia, unspecified: Secondary | ICD-10-CM | POA: Insufficient documentation

## 2016-04-14 MED FILL — UNIFINE PENTIPS 8MM 31G: 31G X 8 MM | 90 days supply | Qty: 300 | Fill #0

## 2016-04-14 MED FILL — ROSUVASTATIN CALCIUM 20 MG: 20 | 90 days supply | Qty: 90 | Fill #0

## 2016-04-14 MED FILL — BYETTA 5 MCG DOSE PEN INJ: 5 | 90 days supply | Qty: 4 | Fill #0

## 2016-04-18 ENCOUNTER — Ambulatory Visit: Payer: Self-pay | Admitting: Pharmacist

## 2016-06-01 MED FILL — PANTOPRAZOLE SOD DR 40 MG T: 40 | 90 days supply | Qty: 180 | Fill #2

## 2016-06-01 MED FILL — glipiZIDE XL 10 MG TB24: 10 | 90 days supply | Qty: 90 | Fill #0

## 2016-06-01 MED FILL — LISINOPRIL 40 MG TABLET: 40 | 90 days supply | Qty: 90 | Fill #2

## 2016-06-01 MED FILL — HYDROCHLOROTHIAZIDE 25 MG T: 25 | 90 days supply | Qty: 90 | Fill #0

## 2016-06-20 MED FILL — metFORMIN HCL 1000 MG TABS: 1000 | 90 days supply | Qty: 180 | Fill #0

## 2016-07-18 DIAGNOSIS — H52203 Unspecified astigmatism, bilateral: Secondary | ICD-10-CM | POA: Diagnosis not present

## 2016-07-18 DIAGNOSIS — H2513 Age-related nuclear cataract, bilateral: Secondary | ICD-10-CM | POA: Diagnosis not present

## 2016-07-18 DIAGNOSIS — H524 Presbyopia: Secondary | ICD-10-CM | POA: Diagnosis not present

## 2016-07-18 DIAGNOSIS — H18529 Epithelial (juvenile) corneal dystrophy, unspecified eye: Secondary | ICD-10-CM | POA: Insufficient documentation

## 2016-07-18 DIAGNOSIS — D3132 Benign neoplasm of left choroid: Secondary | ICD-10-CM

## 2016-07-18 DIAGNOSIS — D3131 Benign neoplasm of right choroid: Secondary | ICD-10-CM | POA: Diagnosis not present

## 2016-07-18 DIAGNOSIS — E119 Type 2 diabetes mellitus without complications: Secondary | ICD-10-CM | POA: Diagnosis not present

## 2016-07-18 DIAGNOSIS — H25041 Posterior subcapsular polar age-related cataract, right eye: Secondary | ICD-10-CM | POA: Diagnosis not present

## 2016-07-18 DIAGNOSIS — H1859 Other hereditary corneal dystrophies: Secondary | ICD-10-CM

## 2016-07-18 DIAGNOSIS — H5203 Hypermetropia, bilateral: Secondary | ICD-10-CM | POA: Diagnosis not present

## 2016-08-05 DIAGNOSIS — E785 Hyperlipidemia, unspecified: Secondary | ICD-10-CM | POA: Diagnosis not present

## 2016-08-05 DIAGNOSIS — E119 Type 2 diabetes mellitus without complications: Secondary | ICD-10-CM | POA: Diagnosis not present

## 2016-08-05 DIAGNOSIS — I10 Essential (primary) hypertension: Secondary | ICD-10-CM | POA: Diagnosis not present

## 2016-08-09 DIAGNOSIS — E119 Type 2 diabetes mellitus without complications: Secondary | ICD-10-CM | POA: Diagnosis not present

## 2016-08-09 DIAGNOSIS — K219 Gastro-esophageal reflux disease without esophagitis: Secondary | ICD-10-CM | POA: Diagnosis not present

## 2016-08-09 DIAGNOSIS — Z23 Encounter for immunization: Secondary | ICD-10-CM | POA: Diagnosis not present

## 2016-08-09 DIAGNOSIS — Z794 Long term (current) use of insulin: Secondary | ICD-10-CM | POA: Diagnosis not present

## 2016-08-09 DIAGNOSIS — I10 Essential (primary) hypertension: Secondary | ICD-10-CM | POA: Diagnosis not present

## 2016-08-10 MED FILL — HUMALOG 100 UNITS/ML KWIKPE: 100 | 90 days supply | Qty: 15 | Fill #0

## 2016-08-10 MED FILL — ACCU-CHEK GUIDE TEST STRIP: 90 days supply | Qty: 300 | Fill #0

## 2016-08-10 MED FILL — ACCU-CHEK FASTCLIX LANCETS: 90 days supply | Qty: 306 | Fill #0

## 2016-08-29 MED FILL — LISINOPRIL 40 MG TABLET: 40 | 90 days supply | Qty: 90 | Fill #3

## 2016-08-29 MED FILL — glipiZIDE XL 10 MG TB24: 10 | 90 days supply | Qty: 90 | Fill #1

## 2016-08-29 MED FILL — HYDROCHLOROTHIAZIDE 25 MG T: 25 | 90 days supply | Qty: 90 | Fill #1

## 2016-09-19 MED FILL — metFORMIN HCL 1000 MG TABS: 1000 | 90 days supply | Qty: 180 | Fill #1

## 2016-11-05 DIAGNOSIS — E119 Type 2 diabetes mellitus without complications: Secondary | ICD-10-CM | POA: Diagnosis not present

## 2016-11-05 DIAGNOSIS — I10 Essential (primary) hypertension: Secondary | ICD-10-CM | POA: Diagnosis not present

## 2016-11-09 DIAGNOSIS — Z794 Long term (current) use of insulin: Secondary | ICD-10-CM | POA: Diagnosis not present

## 2016-11-09 DIAGNOSIS — E119 Type 2 diabetes mellitus without complications: Secondary | ICD-10-CM | POA: Diagnosis not present

## 2016-11-09 DIAGNOSIS — D649 Anemia, unspecified: Secondary | ICD-10-CM | POA: Diagnosis not present

## 2016-11-09 DIAGNOSIS — I10 Essential (primary) hypertension: Secondary | ICD-10-CM | POA: Diagnosis not present

## 2016-11-09 MED FILL — VICTOZA 2-PAK 18 MG/3 ML PE: 18 | 30 days supply | Qty: 6 | Fill #0

## 2016-11-09 MED FILL — LANTUS SOLOSTAR 100 UNITS/M: 100 | 75 days supply | Qty: 15 | Fill #0

## 2016-11-14 MED FILL — UNIFINE PENTIPS 8MM 31G: 31G X 8 MM | 90 days supply | Qty: 300 | Fill #1

## 2016-11-24 MED FILL — LISINOPRIL 40 MG TAB: 40 | 90 days supply | Qty: 90 | Fill #0

## 2016-11-24 MED FILL — HYDROCHLOROTHIAZIDE 25 MG T: 25 | 90 days supply | Qty: 90 | Fill #0

## 2016-11-24 MED FILL — glipiZIDE ER 10 MG TB24: 10 | 90 days supply | Qty: 90 | Fill #0

## 2016-12-13 MED FILL — VICTOZA 2-PAK 18 MG/3 ML PE: 18 | 30 days supply | Qty: 6 | Fill #1

## 2016-12-19 MED FILL — HUMALOG 100 UNITS/ML KWIKPE: 100 | 90 days supply | Qty: 15 | Fill #1

## 2016-12-19 MED FILL — metFORMIN HCL 1000 MG TABS: 1000 | 90 days supply | Qty: 180 | Fill #2

## 2017-01-17 MED FILL — VICTOZA 2-PAK 18 MG/3 ML PE: 18 | 30 days supply | Qty: 6 | Fill #2

## 2017-01-17 MED FILL — LANTUS SOLOSTAR 100 UNITS/M: 100 | 75 days supply | Qty: 15 | Fill #1

## 2017-02-07 DIAGNOSIS — I10 Essential (primary) hypertension: Secondary | ICD-10-CM | POA: Diagnosis not present

## 2017-02-07 DIAGNOSIS — D649 Anemia, unspecified: Secondary | ICD-10-CM | POA: Diagnosis not present

## 2017-02-07 DIAGNOSIS — E119 Type 2 diabetes mellitus without complications: Secondary | ICD-10-CM | POA: Diagnosis not present

## 2017-02-08 MED FILL — UNIFINE PENTIPS 8MM 31G: 31G X 8 MM | 90 days supply | Qty: 300 | Fill #0

## 2017-02-13 DIAGNOSIS — I10 Essential (primary) hypertension: Secondary | ICD-10-CM | POA: Diagnosis not present

## 2017-02-13 DIAGNOSIS — D509 Iron deficiency anemia, unspecified: Secondary | ICD-10-CM | POA: Diagnosis not present

## 2017-02-13 DIAGNOSIS — Z794 Long term (current) use of insulin: Secondary | ICD-10-CM | POA: Diagnosis not present

## 2017-02-13 DIAGNOSIS — E119 Type 2 diabetes mellitus without complications: Secondary | ICD-10-CM | POA: Diagnosis not present

## 2017-02-13 MED FILL — ROSUVASTATIN CALCIUM 20 MG: 20 | 90 days supply | Qty: 90 | Fill #0 | Status: TO

## 2017-02-13 MED FILL — FOLIVANE-PLUS CAPSULE: 30 days supply | Qty: 30 | Fill #0

## 2017-02-14 MED FILL — VICTOZA 2-PAK 18 MG/3 ML PE: 18 | 30 days supply | Qty: 6 | Fill #3

## 2017-02-22 MED FILL — HYDROCHLOROTHIAZIDE 25 MG T: 25 | 90 days supply | Qty: 90 | Fill #1

## 2017-02-22 MED FILL — glipiZIDE ER 10 MG TB24: 10 | 90 days supply | Qty: 90 | Fill #1

## 2017-02-22 MED FILL — LISINOPRIL 40 MG TABLET: 40 | 90 days supply | Qty: 90 | Fill #1

## 2017-03-14 MED FILL — FOLIVANE-PLUS CAPSULE: 30 days supply | Qty: 30 | Fill #1

## 2017-03-14 MED FILL — VICTOZA 2-PAK 18 MG/3 ML PE: 18 | 30 days supply | Qty: 6 | Fill #4

## 2017-03-24 MED FILL — metFORMIN HCL 1000 MG TABS: 1000 | 90 days supply | Qty: 180 | Fill #3

## 2017-04-13 MED FILL — FOLIVANE-PLUS CAPSULE: 30 days supply | Qty: 30 | Fill #2

## 2017-04-13 MED FILL — VICTOZA 2-PAK 18 MG/3 ML PE: 18 | 30 days supply | Qty: 6 | Fill #5

## 2017-04-13 MED FILL — LANTUS SOLOSTAR 100 UNITS/M: 100 | 75 days supply | Qty: 15 | Fill #2 | Status: TO

## 2017-05-15 ENCOUNTER — Encounter: Payer: Self-pay | Admitting: Osteopathic Medicine

## 2017-05-15 ENCOUNTER — Ambulatory Visit (INDEPENDENT_AMBULATORY_CARE_PROVIDER_SITE_OTHER): Payer: No Typology Code available for payment source | Admitting: Osteopathic Medicine

## 2017-05-15 ENCOUNTER — Ambulatory Visit (INDEPENDENT_AMBULATORY_CARE_PROVIDER_SITE_OTHER): Payer: No Typology Code available for payment source

## 2017-05-15 VITALS — BP 127/60 | HR 79 | Temp 97.7°F | Ht <= 58 in | Wt 157.1 lb

## 2017-05-15 DIAGNOSIS — M25511 Pain in right shoulder: Secondary | ICD-10-CM | POA: Diagnosis not present

## 2017-05-15 DIAGNOSIS — E0800 Diabetes mellitus due to underlying condition with hyperosmolarity without nonketotic hyperglycemic-hyperosmolar coma (NKHHC): Secondary | ICD-10-CM

## 2017-05-15 DIAGNOSIS — G8929 Other chronic pain: Secondary | ICD-10-CM | POA: Diagnosis not present

## 2017-05-15 DIAGNOSIS — I10 Essential (primary) hypertension: Secondary | ICD-10-CM

## 2017-05-15 DIAGNOSIS — E611 Iron deficiency: Secondary | ICD-10-CM

## 2017-05-15 DIAGNOSIS — Z5320 Procedure and treatment not carried out because of patient's decision for unspecified reasons: Secondary | ICD-10-CM

## 2017-05-15 DIAGNOSIS — E559 Vitamin D deficiency, unspecified: Secondary | ICD-10-CM | POA: Diagnosis not present

## 2017-05-15 LAB — POCT GLYCOSYLATED HEMOGLOBIN (HGB A1C): Hemoglobin A1C: 7.6

## 2017-05-15 MED ORDER — IBUPROFEN 800 MG PO TABS: 800.0000 mg | ORAL_TABLET | Freq: Three times a day (TID) | ORAL | 0 refills | Status: DC | PRN

## 2017-05-15 MED FILL — HUMALOG 100 UNITS/ML KWIKPE: 100 | 90 days supply | Qty: 15 | Fill #2

## 2017-05-15 MED FILL — FOLIVANE-PLUS CAPSULE: 30 days supply | Qty: 30 | Fill #3 | Status: TO

## 2017-05-15 MED FILL — VICTOZA 2-PAK 18 MG/3 ML PE: 18 | 30 days supply | Qty: 6 | Fill #0

## 2017-05-15 MED FILL — UNIFINE PENTIPS 8MM 31G: 31G X 8 MM | 30 days supply | Qty: 200 | Fill #0 | Status: TO

## 2017-05-15 MED FILL — IBUPROFEN 800 MG TABS: 800 | 20 days supply | Qty: 60 | Fill #0

## 2017-05-15 MED FILL — ACCU-CHEK GUIDE TEST STRIP: 90 days supply | Qty: 300 | Fill #1

## 2017-05-15 NOTE — Patient Instructions (Signed)
If shoulder is not better or if it gets worse, I would recommend follow-up with one of our sports medicine specialists (Dr Georgina Snell or Dr. Patton Salles Dr. Darene Lamer) for further evaluation in 2-4 weeks. Just call our office and ask for an appointment for sports medicine!

## 2017-05-15 NOTE — Progress Notes (Signed)
HPI: Meagan Peck is a 57 y.o. female who  has a past medical history of Diabetes mellitus without complication ([place]), GERD (gastroesophageal reflux disease), and Hypertension.  she presents to [facility] today, 05/15/17,  for chief complaint of:  Chief Complaint  Patient presents with  . Establish Care    New patient here to establish care. Ongoing medical issues include type 2 diabetes, hypertension.  Diabetes: Medications as noted below. Records reviewed from most recent primary care visit prior to establishing here. Previously following at [facility], last seen 02/13/2017 by Dr. Iverson Alamin. Foot Exam was performed at that visit. A1c at that point was 8.0%. Today A1c is looking a little bit better at 7.6%.  Hypertension: No chest pain, pressure, shortness of breath. No home blood pressures to report. Blood pressure is under pretty good control right now. Any refill on medications, is on lisinopril 40 and HCTZ 25.  Shoulder pain:  [place] in September and was more for a while after that, seemed to get a little bit better but is now bothering her. Never had any x-rays, injections, physical therapy. Has been taking ibuprofen and doing pretty well with this, it eases the pain but it doesn't make it go away. Hurts on right shoulder going a little bit down the arm from the joint itself, worse with moving, having difficulty lifting it up.   Past medical, surgical, social and family history reviewed:  Patient Active Problem List   Diagnosis Date Noted  . Acute medial meniscus tear of right knee 12/10/2015  . Diabetes ([place]) 07/23/2012    Past Surgical History:  Procedure Laterality Date  . CARPAL TUNNEL RELEASE     right hand  . CESAREAN SECTION    . CHOLECYSTECTOMY    . KNEE ARTHROSCOPY Right 12/10/2015   Procedure: RIGHT KNEE ARTHROSCOPY WITH PARTIAL MEDIAL MENISCECTOMY;  Surgeon: Mcarthur Rossetti, MD;  Location: [facility];  Service:  Orthopedics;  Laterality: Right;  . KNEE ARTHROSCOPY W/ MENISCAL REPAIR     left knee    Social History   Tobacco Use  . Smoking status: Never Smoker  . Smokeless tobacco: Never Used  Substance Use Topics  . Alcohol use: No    No family history on file.   Current medication list and allergy/intolerance information reviewed:    Current Outpatient Medications  Medication Sig Dispense Refill  . aspirin 81 MG tablet Take 81 mg by mouth daily.    . Cholecalciferol (VITAMIN D3) 5000 UNITS TABS Take 5,000 Units by mouth daily.     Marland Kitchen exenatide (BYETTA) 5 MCG/0.02ML SOLN Inject 5 mcg into the skin 2 (two) times daily with a meal.     . glipiZIDE (GLUCOTROL XL) 10 MG 24 hr tablet Take 10 mg by mouth daily. Take 1 tablet daily if fasting blood sugar is greater than 100.    . hydrochlorothiazide (HYDRODIURIL) 25 MG tablet Take 25 mg by mouth daily.    Marland Kitchen HYDROcodone-acetaminophen (NORCO) 5-325 MG tablet Take 1-2 tablets by mouth every 4 (four) hours as needed for moderate pain. (Patient not taking: Reported on 12/14/2015) 60 tablet 0  . insulin glargine (LANTUS) 100 UNIT/ML injection Inject 14 Units into the skin daily.     Javier Docker Oil 500 MG CAPS Take 500 mg by mouth daily.    Marland Kitchen lisinopril (PRINIVIL,ZESTRIL) 40 MG tablet Take 40 mg by mouth daily.    . metFORMIN (GLUCOPHAGE) 1000 MG tablet Take 1,000 mg by mouth 2 (  two) times daily with a meal.    . pantoprazole (PROTONIX) 40 MG tablet Take 40 mg by mouth 2 (two) times daily.    . rosuvastatin (CRESTOR) 20 MG tablet Take 20 mg by mouth 3 (three) times a week.     No current facility-administered medications for this visit.     Allergies  Allergen Reactions  . Penicillins Anaphylaxis    Happened as a young child  . Naproxen Rash  . Quinine Derivatives Rash  . Sulfa Antibiotics Rash      Review of Systems:  Constitutional:  No  fever, no chills, No recent illness, No unintentional weight changes. No significant fatigue.   HEENT: No   headache, no vision change, no hearing change, No sore throat, No  sinus pressure  Cardiac: No  chest pain, No  pressure, +palpitations - had these once or twice overnight but resolved spontaneously, this happened a while ago without recurrence. No  Orthopnea  Respiratory:  No  shortness of breath. No  Cough  Gastrointestinal: No  abdominal pain, No  nausea, No  vomiting,  No  blood in stool, No  diarrhea, No  Constipation. History of umbilical hernia  Musculoskeletal: No new myalgia/arthralgia  Genitourinary: No  incontinence, No  abnormal genital bleeding, No abnormal genital discharge  Skin: No  Rash, No other wounds/concerning lesions  Hem/Onc: No  easy bruising/bleeding, No  abnormal lymph node  Endocrine: No cold intolerance,  No heat intolerance. No polyuria/polydipsia/polyphagia   Neurologic: No  weakness, No  dizziness, No  slurred speech/focal weakness/facial droop  Psychiatric: No  concerns with depression, No  concerns with anxiety, No sleep problems, No mood problems  Exam:  BP 127/60   Pulse 79   Temp 97.7 F (36.5 C) (Oral)   Ht 4\' 10"  (1.473 m)   Wt 157 lb 1.3 oz (71.3 kg)   LMP 12/03/2013   BMI 32.83 kg/m   Constitutional: VS see above. General Appearance: alert, well-developed, well-nourished, NAD  Eyes: Normal lids and conjunctive, non-icteric sclera  Ears, Nose, Mouth, Throat: MMM, Normal external inspection ears/nares/mouth/lips/gums. TM normal bilaterally. Pharynx/tonsils no erythema, no exudate. Nasal mucosa normal.   Neck: No masses, trachea midline. No thyroid enlargement. No tenderness/mass appreciated. No lymphadenopathy  Respiratory: Normal respiratory effort. no wheeze, no rhonchi, no rales  Cardiovascular: S1/S2 normal, no murmur, no rub/gallop auscultated. RRR. No lower extremity edema  Gastrointestinal: Habitus limits exam somewhat. Nontender, no masses. No hepatomegaly, no splenomegaly. No hernia appreciated. Bowel sounds normal. Rectal  exam deferred.   Musculoskeletal: Gait normal. No pain with shoulder crossover, some pain with lift off test, pain at bicipital groove, negative drop arm test the patient has some difficulty with abduction of the shoulder.   Neurological: Normal balance/coordination. No tremor.   Skin: warm, dry, intact. No rash/ulcer.    Psychiatric: Normal judgment/insight. Normal mood and affect. Oriented x3.    Results for orders placed or performed in visit on 05/15/17 (from the past 72 hour(s))  POCT HgB A1C     Status: None   Collection Time: 05/15/17  9:49 AM  Result Value Ref Range   Hemoglobin A1C 7.6        ASSESSMENT/PLAN:   Diabetes mellitus due to underlying condition with hyperosmolarity without coma, without long-term current use of insulin (HCC) - Plan: insulin lispro (HUMALOG) 100 UNIT/ML KiwkPen, VICTOZA 18 MG/3ML SOPN, glipiZIDE (GLUCOTROL XL) 10 MG 24 hr tablet, Insulin Glargine (LANTUS SOLOSTAR) 100 UNIT/ML Solostar Pen, POCT HgB A1C, ACCU-CHEK FASTCLIX LANCETS  MISC, Insulin Pen Needle (UNIFINE PENTIPS) 31G X 8 MM MISC, liraglutide (VICTOZA) 18 MG/3ML SOPN, Ambulatory referral to Ophthalmology  Chronic right shoulder pain - Suspect biceps tendinitis/bursitis, possible rotator cuff tendinitis. Raise dose ibuprofen, avoid other NSAIDs due to allergy history. Physical therapy/sports m - Plan: DG Shoulder Right, ibuprofen (ADVIL,MOTRIN) 800 MG tablet, Ambulatory referral to Physical Therapy  Benign essential HTN - Stable  Vitamin D deficiency  Iron deficiency - Plan: FeFum-FePoly-FA-B Cmp-C-Biot (FOLIVANE-PLUS) CAPS, POLYSACCHARIDE IRON COMPLEX PO  Breast screening declined  Colonoscopy refused    Patient Instructions  If shoulder is not better or if it gets worse, I would recommend follow-up with one of our sports medicine specialists (Dr Georgina Snell or Dr. Patton Salles Dr. Darene Lamer) for further evaluation in 2-4 weeks. Just call our office and ask for an appointment for sports  medicine!      Visit summary with medication list and pertinent instructions was printed for patient to review. All questions at time of visit were answered - patient instructed to contact office with any additional concerns. ER/RTC precautions were reviewed with the patient.   Follow-up plan: Return in about 3 months (around 08/13/2017) for recheck sugars, sooner if needed .    Please note: voice recognition software was used to produce this document, and typos may escape review. Please contact Dr. Sheppard Coil for any needed clarifications.

## 2017-05-29 MED FILL — glipiZIDE ER 10 MG TB24: 10 | 90 days supply | Qty: 90 | Fill #0

## 2017-05-29 MED FILL — LISINOPRIL 40 MG TABLET: 40 | 90 days supply | Qty: 90 | Fill #2 | Status: TO

## 2017-05-29 MED FILL — HYDROCHLOROTHIAZIDE 25 MG T: 25 | 90 days supply | Qty: 90 | Fill #0

## 2017-06-06 ENCOUNTER — Ambulatory Visit (INDEPENDENT_AMBULATORY_CARE_PROVIDER_SITE_OTHER): Payer: No Typology Code available for payment source | Admitting: Physician Assistant

## 2017-06-06 ENCOUNTER — Encounter: Payer: Self-pay | Admitting: Physician Assistant

## 2017-06-06 VITALS — BP 119/66 | HR 98 | Temp 100.5°F | Wt 160.0 lb

## 2017-06-06 DIAGNOSIS — J101 Influenza due to other identified influenza virus with other respiratory manifestations: Secondary | ICD-10-CM

## 2017-06-06 DIAGNOSIS — B9689 Other specified bacterial agents as the cause of diseases classified elsewhere: Secondary | ICD-10-CM

## 2017-06-06 DIAGNOSIS — R6889 Other general symptoms and signs: Secondary | ICD-10-CM

## 2017-06-06 DIAGNOSIS — J019 Acute sinusitis, unspecified: Secondary | ICD-10-CM | POA: Diagnosis not present

## 2017-06-06 LAB — POCT INFLUENZA A/B
Influenza A, POC: POSITIVE — AB
Influenza B, POC: NEGATIVE

## 2017-06-06 MED ORDER — AZITHROMYCIN 250 MG PO TABS: ORAL_TABLET | ORAL | 0 refills | Status: DC

## 2017-06-06 MED ORDER — OSELTAMIVIR PHOSPHATE 75 MG PO CAPS: 75.0000 mg | ORAL_CAPSULE | Freq: Two times a day (BID) | ORAL | 0 refills | Status: DC

## 2017-06-06 MED ORDER — IPRATROPIUM BROMIDE 0.06 % NA SOLN: 1.0000 | Freq: Four times a day (QID) | NASAL | 0 refills | Status: DC | PRN

## 2017-06-06 MED FILL — IPRATROPIUM 0.06% SPRAY: 0.06 | 21 days supply | Qty: 15 | Fill #0

## 2017-06-06 MED FILL — AZITHROMYCIN 250 MG TABLET: 250 | 5 days supply | Qty: 6 | Fill #0

## 2017-06-06 MED FILL — OSELTAMIVIR PHOSPHATE 75 MG: 75 | 5 days supply | Qty: 10 | Fill #0

## 2017-06-06 NOTE — Progress Notes (Signed)
HPI:                                                                Meagan Peck is a 57 y.o. female who presents to [place]: [place] today for influenza-like symptoms  Influenza  This is a new problem. The current episode started today. Associated symptoms include congestion, coughing (non-productive), fatigue, a fever and myalgias. Pertinent negatives include no abdominal pain. Nothing aggravates the symptoms. Treatments tried: Advil cold & sinus. The treatment provided mild relief.  Symptoms were preceded by 4 weeks of sinus congestion and sinus pressure, which has not resolved.   Depression screen [facility] 2/9 05/15/2017 08/04/2014  Decreased Interest 0 0  Down, Depressed, Hopeless 0 0  PHQ - 2 Score 0 0  Altered sleeping 0 -  Tired, decreased energy 1 -  Change in appetite 0 -  Feeling bad or failure about yourself  0 -  Trouble concentrating 1 -  Moving slowly or fidgety/restless 0 -  Suicidal thoughts 0 -  PHQ-9 Score 2 -    No flowsheet data found.    Past Medical History:  Diagnosis Date  . Diabetes mellitus without complication ([place])   . GERD (gastroesophageal reflux disease)   . Hypertension    Past Surgical History:  Procedure Laterality Date  . CARPAL TUNNEL RELEASE     right hand  . CESAREAN SECTION    . CHOLECYSTECTOMY    . KNEE ARTHROSCOPY Right 12/10/2015   Procedure: RIGHT KNEE ARTHROSCOPY WITH PARTIAL MEDIAL MENISCECTOMY;  Surgeon: Mcarthur Rossetti, MD;  Location: [facility];  Service: Orthopedics;  Laterality: Right;  . KNEE ARTHROSCOPY W/ MENISCAL REPAIR     left knee   Social History   Tobacco Use  . Smoking status: Never Smoker  . Smokeless tobacco: Never Used  Substance Use Topics  . Alcohol use: No   family history includes Alcoholism in her father, maternal uncle, and paternal uncle; COPD in her mother; Diabetes in her brother and paternal grandmother; Heart attack in her father; Hypertension in  her mother.    ROS: negative except as noted in the HPI  Medications: Current Outpatient Medications  Medication Sig Dispense Refill  . ACCU-CHEK FASTCLIX LANCETS MISC Check fsbs TID    . aspirin 81 MG tablet Take 81 mg by mouth daily.    . Cholecalciferol (VITAMIN D3) 5000 UNITS TABS Take 5,000 Units by mouth daily.     Marland Kitchen FeFum-FePoly-FA-B Cmp-C-Biot (FOLIVANE-PLUS) CAPS Take 1 capsule by mouth daily.  5  . glipiZIDE (GLUCOTROL XL) 10 MG 24 hr tablet Take 10 mg by mouth daily.    . hydrochlorothiazide (HYDRODIURIL) 25 MG tablet Take 25 mg by mouth daily.    Marland Kitchen ibuprofen (ADVIL,MOTRIN) 800 MG tablet Take 1 tablet (800 mg total) by mouth every 8 (eight) hours as needed for moderate pain. 60 tablet 0  . Insulin Glargine (LANTUS SOLOSTAR) 100 UNIT/ML Solostar Pen Inject 12-20 Units into the skin daily.    . insulin lispro (HUMALOG) 100 UNIT/ML KiwkPen Inject 5-15 Units into the skin daily.    . Insulin Pen Needle (UNIFINE PENTIPS) 31G X 8 MM MISC USE AS DIRECTED WITH LANTUS AND BYETTA    . Krill Oil 500 MG CAPS  Take 500 mg by mouth daily.    Marland Kitchen lisinopril (PRINIVIL,ZESTRIL) 40 MG tablet Take 40 mg by mouth daily.    . metFORMIN (GLUCOPHAGE) 1000 MG tablet Take 1,000 mg by mouth 2 (two) times daily with a meal.    . POLYSACCHARIDE IRON COMPLEX PO Take by mouth.    . rosuvastatin (CRESTOR) 20 MG tablet Take 20 mg by mouth 3 (three) times a week.    Marland Kitchen VICTOZA 18 MG/3ML SOPN INJECT 0 2 MLS  1 2 MG TOTAL  INTO THE SKIN DAILY  5   No current facility-administered medications for this visit.    Allergies  Allergen Reactions  . Penicillins Anaphylaxis    Happened as a young child  . Naproxen Rash  . Quinine Derivatives Rash  . Sulfa Antibiotics Rash       Objective:  BP 119/66   Pulse 98   Temp (!) 100.5 F (38.1 C) (Oral)   Wt 160 lb (72.6 kg)   LMP 12/03/2013   BMI 33.44 kg/m  Gen:  alert, ill-appearing, not toxic-appearing, no distress, appropriate for age 71: head  normocephalic without obvious abnormality, TM's clear bilaterally, oropharynx clear, moist mucous membranes, nasal mucosa edematous, there is frontal sinus tenderness, conjunctiva and cornea clear, neck supple, no adenopathy, trachea midline Pulm: Normal work of breathing, normal phonation, clear to auscultation bilaterally, no wheezes, rales or rhonchi CV: Normal rate, regular rhythm, s1 and s2 distinct, grade II/VI systolic murmur, clicks or rubs  Neuro: alert and oriented x 3, no tremor MSK: extremities atraumatic, normal gait and station Skin: intact, no rashes on exposed skin, no jaundice, no cyanosis    No results found for this or any previous visit (from the past 72 hour(s)). No results found.    Assessment and Plan: 57 y.o. female with   1. Flu-like symptoms - POCT Influenza A/B positive  2. Influenza A - she is in the window for Tamiflu - continue symptomatic care - work note provided for 5 days - oseltamivir (TAMIFLU) 75 MG capsule; Take 1 capsule (75 mg total) by mouth 2 (two) times daily.  Dispense: 10 capsule; Refill: 0  3. Acute bacterial sinusitis - azithromycin (ZITHROMAX Z-PAK) 250 MG tablet; Take 2 tablets (500 mg) on  Day 1,  followed by 1 tablet (250 mg) once daily on Days 2 through 5.  Dispense: 6 tablet; Refill: 0 - ipratropium (ATROVENT) 0.06 % nasal spray; Place 1 spray into both nostrils 4 (four) times daily as needed.  Dispense: 15 mL; Refill: 0  Patient education and anticipatory guidance given Patient agrees with treatment plan Follow-up as needed if symptoms worsen or fail to improve  Darlyne Russian PA-C

## 2017-06-06 NOTE — Patient Instructions (Signed)

## 2017-06-22 ENCOUNTER — Telehealth: Payer: Self-pay

## 2017-06-22 MED FILL — VICTOZA 2-PAK 18 MG/3 ML PE: 18 | 30 days supply | Qty: 6 | Fill #1 | Status: TO

## 2017-06-22 NOTE — Telephone Encounter (Signed)
[facility] outpatient pharmacy requesting medication refill for metformin 1000 mg. Rx written by historical provider. Thanks.

## 2017-06-23 MED ORDER — METFORMIN HCL 1000 MG PO TABS: 1000.0000 mg | ORAL_TABLET | Freq: Two times a day (BID) | ORAL | 3 refills | Status: DC

## 2017-06-23 MED FILL — metFORMIN HCL 1000 MG TABS: 1000 | 45 days supply | Qty: 90 | Fill #0

## 2017-06-23 NOTE — Telephone Encounter (Signed)
OK to refill 90 days w/ 3 refills

## 2017-06-23 NOTE — Telephone Encounter (Signed)
Rx sent. Left VM updating Pt.

## 2017-07-05 MED FILL — FOLIVANE-PLUS CAPSULE: 30 days supply | Qty: 30 | Fill #0

## 2017-07-20 MED FILL — VICTOZA 2-PAK 18 MG/3 ML PE: 18 | 30 days supply | Qty: 6 | Fill #0

## 2017-07-20 MED FILL — ULTICARE PEN NDL 8MM 31G: 31G X 8 MM | 30 days supply | Qty: 200 | Fill #0

## 2017-07-21 MED FILL — LANTUS SOLOSTAR 100 UNITS/M: 100 | 75 days supply | Qty: 15 | Fill #0

## 2017-08-07 MED FILL — metFORMIN HCL 1000 MG TABS: 1000 | 45 days supply | Qty: 90 | Fill #1

## 2017-08-11 ENCOUNTER — Encounter: Payer: Self-pay | Admitting: Osteopathic Medicine

## 2017-08-11 ENCOUNTER — Ambulatory Visit (INDEPENDENT_AMBULATORY_CARE_PROVIDER_SITE_OTHER): Payer: No Typology Code available for payment source | Admitting: Osteopathic Medicine

## 2017-08-11 VITALS — BP 123/73 | HR 77 | Temp 98.1°F | Wt 153.1 lb

## 2017-08-11 DIAGNOSIS — J0110 Acute frontal sinusitis, unspecified: Secondary | ICD-10-CM | POA: Diagnosis not present

## 2017-08-11 DIAGNOSIS — R059 Cough, unspecified: Secondary | ICD-10-CM

## 2017-08-11 DIAGNOSIS — E1169 Type 2 diabetes mellitus with other specified complication: Secondary | ICD-10-CM

## 2017-08-11 DIAGNOSIS — R05 Cough: Secondary | ICD-10-CM

## 2017-08-11 DIAGNOSIS — Z794 Long term (current) use of insulin: Secondary | ICD-10-CM

## 2017-08-11 LAB — POCT GLYCOSYLATED HEMOGLOBIN (HGB A1C): Hemoglobin A1C: 7.1

## 2017-08-11 MED ORDER — FLUTICASONE PROPIONATE 50 MCG/ACT NA SUSP: 2.0000 | Freq: Every day | NASAL | 6 refills | Status: DC

## 2017-08-11 MED ORDER — BENZONATATE 200 MG PO CAPS: 200.0000 mg | ORAL_CAPSULE | Freq: Three times a day (TID) | ORAL | 0 refills | Status: DC | PRN

## 2017-08-11 MED ORDER — DOXYCYCLINE MONOHYDRATE 100 MG PO CAPS: 100.0000 mg | ORAL_CAPSULE | Freq: Two times a day (BID) | ORAL | 0 refills | Status: DC

## 2017-08-11 MED FILL — BENZONATATE 200 MG CAP: 200 | 10 days supply | Qty: 30 | Fill #0

## 2017-08-11 MED FILL — FLUTICASONE PROP 50 MCG SPR: 50 | 30 days supply | Qty: 16 | Fill #0

## 2017-08-11 MED FILL — DOXYCYCLINE HYC 100 MG CAPS: 100 | 7 days supply | Qty: 14 | Fill #0

## 2017-08-11 NOTE — Progress Notes (Signed)
HPI: Meagan Peck is a 57 y.o. female who  has a past medical history of Diabetes mellitus without complication ([place]), GERD (gastroesophageal reflux disease), and Hypertension.  she presents to [facility] today, 08/11/17,  for chief complaint of:  Follow-up Diabetes   Diabetes: Medications now:   Humalog 10 Units daily w/ largest meal   Lantus 14 Units daily   Victoza 1.2 mg daily   Glipizide XR 10 mg daily  Metformin 1000 mg bid  A1C records  at [facility], 02/13/2017. Foot Exam was performed at that visit. A1c at that point was 8.0%.   A1c is looking a little bit better here 05/15/17 at 7.6%.  Today 08/11/17: 7.1% yay!   Hypertension: No chest pain, pressure. No home blood pressures to report. Blood pressure is under pretty good control right now.  Cough:  Husband was doing some yard work this weekend, since then she developed cough issues starting     Past medical history, surgical history, social history and family history reviewed.   Current medication list and allergy/intolerance information reviewed.     Allergies  Allergen Reactions  . Penicillins Anaphylaxis    Happened as a young child  . Atrovent [Ipratropium] Palpitations    As per patient  . Naproxen Rash  . Quinine Derivatives Rash  . Sulfa Antibiotics Rash      Review of Systems:  Constitutional: No recent illness  HEENT: No  headache, no vision change  Cardiac: No  chest pain, No  pressure, No palpitations  Respiratory:  No  shortness of breath. No  Cough  Gastrointestinal: No  abdominal pain, no change on bowel habits  Musculoskeletal: No new myalgia/arthralgia  Skin: No  Rash  Hem/Onc: No  easy bruising/bleeding, No  abnormal lumps/bumps  Neurologic: No  weakness, No  Dizziness  Psychiatric: No  concerns with depression, No  concerns with anxiety  Exam:  BP 123/73 (BP Location: Right Arm, Patient Position: Sitting, Cuff Size: Normal)    Pulse 77   Temp 98.1 F (36.7 C) (Oral)   Wt 153 lb 1.3 oz (69.4 kg)   LMP 12/03/2013   BMI 31.99 kg/m   Constitutional: VS see above. General Appearance: alert, well-developed, well-nourished, NAD  Eyes: Normal lids and conjunctive, non-icteric sclera  Ears, Nose, Mouth, Throat: MMM, Normal external inspection ears/nares/mouth/lips/gums.  Neck: No masses, trachea midline.   Respiratory: Normal respiratory effort. no wheeze, no rhonchi, no rales  Cardiovascular: S1/S2 normal, no murmur, no rub/gallop auscultated. RRR.   Musculoskeletal: Gait normal. Symmetric and independent movement of all extremities  Neurological: Normal balance/coordination. No tremor.  Skin: warm, dry, intact.   Psychiatric: Normal judgment/insight. Normal mood and affect. Oriented x3.     ASSESSMENT/PLAN:   Type 2 diabetes mellitus with other specified complication, with long-term current use of insulin (HCC) - Plan: POCT HgB A1C  Acute non-recurrent frontal sinusitis  Cough   Meds ordered this encounter  Medications  . doxycycline (MONODOX) 100 MG capsule    Sig: Take 1 capsule (100 mg total) by mouth 2 (two) times daily. Fil lis sinus symptoms worse/persist    Dispense:  14 capsule    Refill:  0  . fluticasone (FLONASE) 50 MCG/ACT nasal spray    Sig: Place 2 sprays into both nostrils daily.    Dispense:  16 g    Refill:  6  . benzonatate (TESSALON) 200 MG capsule    Sig: Take 1 capsule (200 mg total) by mouth  3 (three) times daily as needed for cough.    Dispense:  30 capsule    Refill:  0    Patient Instructions  Antibiotics Doxycycline to take if needed, nasal spray, and cough medicine     Follow-up plan: Return in about 3 months (around 11/10/2017) for recheck A1C, sooner if needed / if cough illness worse .  Visit summary with medication list and pertinent instructions was printed for patient to review, alert [place] if any changes needed. All questions at time of visit were  answered - patient instructed to contact office with any additional concerns. ER/RTC precautions were reviewed with the patient and understanding verbalized.   Note: Total time spent 25 minutes, greater than 50% of the visit was spent face-to-face counseling and coordinating care for the following: The primary encounter diagnosis was Type 2 diabetes mellitus with other specified complication, with long-term current use of insulin ([place]). Diagnoses of Acute non-recurrent frontal sinusitis and Cough were also pertinent to this visit.Marland Kitchen  Please note: voice recognition software was used to produce this document, and typos may escape review. Please contact Dr. Sheppard Coil for any needed clarifications.

## 2017-08-11 NOTE — Patient Instructions (Signed)
Antibiotics Doxycycline to take if needed, nasal spray, and cough medicine

## 2017-08-13 ENCOUNTER — Encounter: Payer: Self-pay | Admitting: Osteopathic Medicine

## 2017-08-14 ENCOUNTER — Ambulatory Visit: Payer: No Typology Code available for payment source | Admitting: Osteopathic Medicine

## 2017-08-25 MED FILL — VICTOZA 2-PAK 18 MG/3 ML PE: 18 | 30 days supply | Qty: 6 | Fill #1

## 2017-08-25 MED FILL — LISINOPRIL 40 MG TABS: 40 | 90 days supply | Qty: 90 | Fill #0

## 2017-08-28 ENCOUNTER — Telehealth: Payer: Self-pay

## 2017-08-28 DIAGNOSIS — E0800 Diabetes mellitus due to underlying condition with hyperosmolarity without nonketotic hyperglycemic-hyperosmolar coma (NKHHC): Secondary | ICD-10-CM

## 2017-08-28 NOTE — Telephone Encounter (Signed)
[facility] pharmacy requesting med refills for glipizide & HCTZ. Both rxs written by historical provider's. Pls advise if refills are appropriate. Thanks.

## 2017-08-29 MED ORDER — GLIPIZIDE ER 10 MG PO TB24: 10.0000 mg | ORAL_TABLET | Freq: Every day | ORAL | 3 refills | Status: DC

## 2017-08-29 MED ORDER — HYDROCHLOROTHIAZIDE 25 MG PO TABS: 25.0000 mg | ORAL_TABLET | Freq: Every day | ORAL | 3 refills | Status: DC

## 2017-08-29 MED FILL — glipiZIDE ER 10 MG TB24: 10 | 90 days supply | Qty: 90 | Fill #0

## 2017-08-29 MED FILL — HYDROCHLOROTHIAZIDE 25 MG T: 25 | 90 days supply | Qty: 90 | Fill #0

## 2017-08-29 NOTE — Telephone Encounter (Signed)
Sent!

## 2017-08-29 NOTE — Telephone Encounter (Signed)
Left a detailed vm msg for pt regarding med refills sent to [place]. Call back information provided.

## 2017-09-14 MED FILL — FOLIVANE-PLUS CAPSULE: 30 days supply | Qty: 30 | Fill #1

## 2017-09-14 MED FILL — metFORMIN HCL 1000 MG TABS: 1000 | 45 days supply | Qty: 90 | Fill #2

## 2017-09-14 MED FILL — ROSUVASTATIN CALCIUM 20 MG: 20 | 90 days supply | Qty: 90 | Fill #0

## 2017-09-26 MED FILL — VICTOZA 2-PAK 18 MG/3 ML PE: 18 | 30 days supply | Qty: 6 | Fill #2

## 2017-09-29 ENCOUNTER — Other Ambulatory Visit: Payer: Self-pay

## 2017-09-29 DIAGNOSIS — E0800 Diabetes mellitus due to underlying condition with hyperosmolarity without nonketotic hyperglycemic-hyperosmolar coma (NKHHC): Secondary | ICD-10-CM

## 2017-09-29 MED ORDER — INSULIN PEN NEEDLE 31G X 8 MM MISC: 12 refills | Status: DC

## 2017-09-29 MED FILL — ULTICARE PEN NDL 8MM 31G: 31G X 8 MM | 30 days supply | Qty: 100 | Fill #0

## 2017-10-05 ENCOUNTER — Ambulatory Visit (INDEPENDENT_AMBULATORY_CARE_PROVIDER_SITE_OTHER): Payer: No Typology Code available for payment source | Admitting: Family Medicine

## 2017-10-05 ENCOUNTER — Ambulatory Visit (INDEPENDENT_AMBULATORY_CARE_PROVIDER_SITE_OTHER): Payer: No Typology Code available for payment source

## 2017-10-05 ENCOUNTER — Encounter: Payer: Self-pay | Admitting: Family Medicine

## 2017-10-05 VITALS — BP 116/61 | HR 81 | Ht <= 58 in | Wt 158.0 lb

## 2017-10-05 DIAGNOSIS — M25561 Pain in right knee: Secondary | ICD-10-CM

## 2017-10-05 DIAGNOSIS — M25562 Pain in left knee: Secondary | ICD-10-CM

## 2017-10-05 MED ORDER — DICLOFENAC SODIUM 1 % TD GEL: 4.0000 g | Freq: Four times a day (QID) | TRANSDERMAL | 11 refills | Status: DC

## 2017-10-05 MED FILL — DICLOFENAC SODIUM 1% GEL: 1 | 7 days supply | Qty: 100 | Fill #0

## 2017-10-05 NOTE — Progress Notes (Signed)
Subjective:    I'm seeing this patient as a consultation for:  Meagan Reeve, DO   CC: Left knee pain  HPI: Meagan Peck notes left knee pain ongoing now for 2 weeks.  She denies any specific injury.  She notes the pain is located at the medial aspect of the knee.  She notes the pain is worse with activity and better with rest.  She has tried some over-the-counter medications which have helped a bit.  She does note that she has a history of 3 separate surgeries to her knee describing arthroscopic meniscectomies.  She denies any fevers or chills nausea vomiting.  Symptoms are interfering with her ability to exercise and walk normally at times.  She notes crepitation but denies any significant locking or catching.  Past medical history, Surgical history, Family history not pertinant except as noted below, Social history, Allergies, and medications have been entered into the medical record, reviewed, and no changes needed.   Review of Systems: No headache, visual changes, nausea, vomiting, diarrhea, constipation, dizziness, abdominal pain, skin rash, fevers, chills, night sweats, weight loss, swollen lymph nodes, body aches, joint swelling, muscle aches, chest pain, shortness of breath, mood changes, visual or auditory hallucinations.   Objective:    Vitals:   10/05/17 1445  BP: 116/61  Pulse: 81   General: Well Developed, well nourished, and in no acute distress.  Neuro/Psych: Alert and oriented x3, extra-ocular muscles intact, able to move all 4 extremities, sensation grossly intact. Skin: Warm and dry, no rashes noted.  Respiratory: Not using accessory muscles, speaking in full sentences, trachea midline.  Cardiovascular: Pulses palpable, no extremity edema. Abdomen: Does not appear distended. MSK:  Left knee: Normal-appearing with no effusion or erythema. Range of motion 0-120 degrees with retropatellar crepitations. Tender to palpation medial joint line. Stable ligamentous  exam. Positive medial McMurray's test. Intact flexion and extension strength.  X-ray left knee my personal independent interpretation of images: Moderate medial compartment DJD with no acute fractures.  Awaiting formal radiology review   Impression and Recommendations:    Assessment and Plan: 57 y.o. female with left knee pain likely due to DJD however there may be other collections such as gout rheumatologic process or meniscus injury.  Discussed treatment options.  Recommend weight loss and quadricep strengthening.  Home exercises reviewed.  Additionally will prescribe diclofenac gel.  Patient would like to hold on steroid injection today and recheck in a few weeks.  If not better next step will be steroid injection and then possibly MRI.    Orders Placed This Encounter  Procedures  . DG Knee Complete 4 Views Left    Please include patellar sunrise, lateral, and weightbearing bilateral AP and bilateral rosenberg views    Standing Status:   Future    Number of Occurrences:   1    Standing Expiration Date:   12/05/2018    Order Specific Question:   Reason for exam:    Answer:   Please include patellar sunrise, lateral, and weightbearing bilateral AP and bilateral rosenberg views    Comments:   Please include patellar sunrise, lateral, and weightbearing bilateral AP and bilateral rosenberg views    Order Specific Question:   Preferred imaging location?    Answer:   [facility]  . DG Knee 1-2 Views Right    Standing Status:   Future    Number of Occurrences:   1    Standing Expiration Date:   12/06/2018    Order Specific Question:  Reason for Exam (SYMPTOM  OR DIAGNOSIS REQUIRED)    Answer:   For use with the left knee x-ray bilateral AP and Rosenberg standing.    Order Specific Question:   Is the patient pregnant?    Answer:   No    Order Specific Question:   Preferred imaging location?    Answer:   [facility]   Meds ordered this encounter  Medications  .  diclofenac sodium (VOLTAREN) 1 % GEL    Sig: Apply 4 g topically 4 (four) times daily. To affected joint.    Dispense:  100 g    Refill:  11    [facility] and failed ibuprofen and allergic to naproxen    Discussed warning signs or symptoms. Please see discharge instructions. Patient expresses understanding.

## 2017-10-05 NOTE — Patient Instructions (Signed)
Thank you for coming in today. Try the diclofenac gel 4x daily  Recheck with me in 6 weeks.  Return sooner for injection or next steps if not improving or if worsening.    Knee Pain, Adult Knee pain in adults is common. It can be caused by many things, including:  Arthritis.  A fluid-filled sac (cyst) or growth in your knee.  An infection in your knee.  An injury that will not heal.  Damage, swelling, or irritation of the tissues that support your knee.  Knee pain is usually not a sign of a serious problem. The pain may go away on its own with time and rest. If it does not, a health care provider may order tests to find the cause of the pain. These may include:  Imaging tests, such as an X-ray, MRI, or ultrasound.  Joint aspiration. In this test, fluid is removed from the knee.  Arthroscopy. In this test, a lighted tube is inserted into knee and an image is projected onto a TV screen.  A biopsy. In this test, a sample of tissue is removed from the body and studied under a microscope.  Follow these instructions at home: Pay attention to any changes in your symptoms. Take these actions to relieve your pain. Activity  Rest your knee.  Do not do things that cause pain or make pain worse.  Avoid high-impact activities or exercises, such as running, jumping rope, or doing jumping jacks. General instructions  Take over-the-counter and prescription medicines only as told by your health care provider.  Raise (elevate) your knee above the level of your heart when you are sitting or lying down.  Sleep with a pillow under your knee.  If directed, apply ice to the knee: ? Put ice in a plastic bag. ? Place a towel between your skin and the bag. ? Leave the ice on for 20 minutes, 2-3 times a day.  Ask your health care provider if you should wear an elastic knee support.  Lose weight if you are overweight. Extra weight can put pressure on your knee.  Do not use any products  that contain nicotine or tobacco, such as cigarettes and e-cigarettes. Smoking may slow the healing of any bone and joint problems that you may have. If you need help quitting, ask your health care provider. Contact a health care provider if:  Your knee pain continues, changes, or gets worse.  You have a fever along with knee pain.  Your knee buckles or locks up.  Your knee swells, and the swelling becomes worse. Get help right away if:  Your knee feels warm to the touch.  You cannot move your knee.  You have severe pain in your knee.  You have chest pain.  You have trouble breathing. Summary  Knee pain in adults is common. It can be caused by many things, including, arthritis, infection, cysts, or injury.  Knee pain is usually not a sign of a serious problem, but if it does not go away, a health care provider may perform tests to know the cause of the pain.  Pay attention to any changes in your symptoms. Relieve your pain with rest, medicines, light activity, and use of ice.  Get help if your pain continues or becomes very severe, or if your knee buckles or locks up, or if you have chest pain or trouble breathing. This information is not intended to replace advice given to you by your health care provider. Make sure you  discuss any questions you have with your health care provider. Document Released: 02/13/2007 Document Revised: 04/08/2016 Document Reviewed: 04/08/2016 Elsevier Interactive Patient Education  [employer].

## 2017-10-06 ENCOUNTER — Encounter: Payer: Self-pay | Admitting: Family Medicine

## 2017-10-23 ENCOUNTER — Telehealth: Payer: Self-pay | Admitting: Osteopathic Medicine

## 2017-10-23 DIAGNOSIS — E0800 Diabetes mellitus due to underlying condition with hyperosmolarity without nonketotic hyperglycemic-hyperosmolar coma (NKHHC): Secondary | ICD-10-CM

## 2017-10-23 MED ORDER — INSULIN LISPRO 100 UNIT/ML (KWIKPEN): 10.0000 [IU] | PEN_INJECTOR | Freq: Every day | SUBCUTANEOUS | 5 refills | Status: DC

## 2017-10-23 MED FILL — HUMALOG 100 UNITS/ML KWIKPE: 100 | 90 days supply | Qty: 9 | Fill #0

## 2017-10-23 MED FILL — VICTOZA 2-PAK 18 MG/3 ML PE: 18 | 30 days supply | Qty: 6 | Fill #3

## 2017-10-23 NOTE — Telephone Encounter (Signed)
Pharmacy is requesting a refill on Humalog and the current directions say 5-15 units daily. Per the note on 08/11/2017, its states 10 units daily with large meal. Please advise.

## 2017-10-23 NOTE — Telephone Encounter (Signed)
Yeah, 10 units is what's needed  I fixed the rx!

## 2017-11-06 MED FILL — metFORMIN HCL 1000 MG TABS: 1000 | 45 days supply | Qty: 90 | Fill #3

## 2017-11-06 MED FILL — ULTICARE PEN NDL 8MM 31G: 31G X 8 MM | 30 days supply | Qty: 100 | Fill #1

## 2017-11-10 ENCOUNTER — Ambulatory Visit (INDEPENDENT_AMBULATORY_CARE_PROVIDER_SITE_OTHER): Payer: No Typology Code available for payment source | Admitting: Osteopathic Medicine

## 2017-11-10 ENCOUNTER — Encounter: Payer: Self-pay | Admitting: Osteopathic Medicine

## 2017-11-10 VITALS — BP 116/78 | HR 105 | Temp 98.1°F | Ht <= 58 in | Wt 156.0 lb

## 2017-11-10 DIAGNOSIS — E119 Type 2 diabetes mellitus without complications: Secondary | ICD-10-CM | POA: Diagnosis not present

## 2017-11-10 DIAGNOSIS — R55 Syncope and collapse: Secondary | ICD-10-CM | POA: Diagnosis not present

## 2017-11-10 DIAGNOSIS — Z794 Long term (current) use of insulin: Secondary | ICD-10-CM | POA: Diagnosis not present

## 2017-11-10 LAB — POCT GLYCOSYLATED HEMOGLOBIN (HGB A1C): Hemoglobin A1C: 6.8 % — AB (ref 4.0–5.6)

## 2017-11-10 NOTE — Progress Notes (Signed)
HPI: Meagan Peck is a 57 y.o. female who  has a past medical history of Diabetes mellitus without complication ([place]), GERD (gastroesophageal reflux disease), and Hypertension.  she presents to [facility] today, 11/10/17,  for chief complaint of:  Follow-up Diabetes  New: Syncope episodes   Syncope: 4 episodes total. First ones between Oct and [name], most recent one 10/27/17 2 weeks ago  Consistently 9:00/10:00 PM, getting out of bed to go to the bathroom and feels faint then wakes up on the floor. No CP/SOB, no dizziness. No dyspnea or chest pain on exertion otherwise. Hasn't thought to check Glc when episode occurs. Can't think of any other triggers such as dehydration, reduced appetite, insulin dosing without eating, other illness, other activity which may have precipitates/preceded the episodes.   Diabetes: Medications now:   Humalog 10 Units daily w/ largest meal   Lantus 14 Units daily   Victoza 1.2 mg daily   Glipizide XR 10 mg daily  Metformin 1000 mg bid  A1C records  at [facility], 02/13/2017. Foot Exam was performed at that visit. A1c at that point was 8.0%.   A1c was looking a little bit better here 05/15/17 at 7.6%.  08/11/17: 7.1% yay! No changes made to meds   11/10/17: 6.8% yay!   Hypertension: No chest pain, pressure. No home blood pressures to report. Blood pressure is at goal today 130/80.        Past medical history, surgical history, social history and family history reviewed.   Current Meds  Medication Sig  . ACCU-CHEK FASTCLIX LANCETS MISC Check fsbs TID  . aspirin 81 MG tablet Take 81 mg by mouth daily.  . Cholecalciferol (VITAMIN D3) 5000 UNITS TABS Take 5,000 Units by mouth daily.   . diclofenac sodium (VOLTAREN) 1 % GEL Apply 4 g topically 4 (four) times daily. To affected joint.  . FeFum-FePoly-FA-B Cmp-C-Biot (FOLIVANE-PLUS) CAPS Take 1 capsule by mouth daily.  Marland Kitchen glipiZIDE (GLUCOTROL XL) 10 MG 24 hr  tablet Take 1 tablet (10 mg total) by mouth daily.  . hydrochlorothiazide (HYDRODIURIL) 25 MG tablet Take 1 tablet (25 mg total) by mouth daily.  Marland Kitchen ibuprofen (ADVIL,MOTRIN) 800 MG tablet Take 1 tablet (800 mg total) by mouth every 8 (eight) hours as needed for moderate pain.  . Insulin Glargine (LANTUS SOLOSTAR) 100 UNIT/ML Solostar Pen Inject 12-20 Units into the skin daily.  . insulin lispro (HUMALOG) 100 UNIT/ML KiwkPen Inject 0.1 mLs (10 Units total) into the skin daily. Prior to largest meal of the day  . Insulin Pen Needle (UNIFINE PENTIPS) 31G X 8 MM MISC USE AS DIRECTED WITH LANTUS AND BYETTA  . ipratropium (ATROVENT) 0.06 % nasal spray Place 1 spray into both nostrils 4 (four) times daily as needed.  Javier Docker Oil 500 MG CAPS Take 500 mg by mouth daily.  Marland Kitchen lisinopril (PRINIVIL,ZESTRIL) 40 MG tablet Take 40 mg by mouth daily.  . metFORMIN (GLUCOPHAGE) 1000 MG tablet Take 1 tablet (1,000 mg total) by mouth 2 (two) times daily with a meal.  . rosuvastatin (CRESTOR) 20 MG tablet Take 20 mg by mouth 3 (three) times a week.  Marland Kitchen VICTOZA 18 MG/3ML SOPN INJECT 0 2 MLS  1 2 MG TOTAL  INTO THE SKIN DAILY    Current medication list and allergy/intolerance information reviewed.     Allergies  Allergen Reactions  . Penicillins Anaphylaxis    Happened as a young child  . Atrovent [Ipratropium] Palpitations  As per patient  . Naproxen Rash  . Quinine Derivatives Rash  . Sulfa Antibiotics Rash      Review of Systems:  Constitutional: No recent illness  HEENT: No  headache, no vision change  Cardiac: No  chest pain, No  pressure, No palpitations, no orthopnea, no LE swelling   Respiratory:  No  shortness of breath. No  Cough  Gastrointestinal: No  abdominal pain, no change on bowel habits  Musculoskeletal: No new myalgia/arthralgia  Skin: No  Rash  Hem/Onc: No  easy bruising/bleeding, No  abnormal lumps/bumps  Neurologic: No  weakness, No  Dizziness, (+)symcopal episodes as above    Psychiatric: No  concerns with depression, No  concerns with anxiety  Exam:  BP 116/78   Pulse (!) 105   Temp 98.1 F (36.7 C) (Oral)   Ht 4\' 10"  (1.473 m)   Wt 156 lb (70.8 kg)   LMP 12/03/2013   BMI 32.60 kg/m   Constitutional: VS see above. General Appearance: alert, well-developed, well-nourished, NAD  Eyes: Normal lids and conjunctive, non-icteric sclera  Ears, Nose, Mouth, Throat: MMM, Normal external inspection ears/nares/mouth/lips/gums.  Neck: No masses, trachea midline.   Respiratory: Normal respiratory effort. no wheeze, no rhonchi, no rales  Cardiovascular: S1/S2 normal, no murmur, no rub/gallop auscultated. RRR.   Musculoskeletal: Gait normal. Symmetric and independent movement of all extremities  Abd/GI: nontender, nondistended, no organomegaly though habitus somewhat limits exam.   Neurological: Normal balance/coordination. No tremor.  Skin: warm, dry, intact.   Psychiatric: Normal judgment/insight. Normal mood and affect. Oriented x3.     EKG interpretation: Rate: 74 Rhythm: sinus No ST/T changes concerning for acute ischemia/infarct  Previous EKG 12/04/15 looks the same   Orthostatic VS for the past 24 hrs:  BP- Lying BP- Sitting BP- Standing at 0 minutes  11/10/17 1026 110/58 116/78 116/80       ASSESSMENT/PLAN:   Type 2 diabetes mellitus without complication, with long-term current use of insulin ([place]) - Plan: POCT HgB A1C, CBC, COMPLETE METABOLIC PANEL WITH GFR, TSH, Microalbumin / creatinine urine ratio  Syncope, unspecified syncope type - Plan: CBC, COMPLETE METABOLIC PANEL WITH GFR, TSH, Magnesium, Urinalysis, Routine w reflex microscopic, ECHOCARDIOGRAM COMPLETE, VAS US CAROTID, Ambulatory referral to Cardiology, Holter monitor - 48 hour     Patient Instructions  Plan:  Your passing-out episodes sound like probably sugars dropping, but we should rule out cardiac issues, too.   EKG today looks ok.   Vital signs today look  good. Your blood pressure isn't dropping when you sit or stand.   Will get labs today.   Will get you set up with an echocardiogram (ultrasound of the heart) and an ultrasound of the carotid arteries in the neck.   Will get you set up with a Holter monitor to look at heart rhythm over 48 hours.   Will get you set up with a cardiology appointment to see if heart monitoring or stress testing or other workup might be in order.  If the episode happens again, or episodes become worse/more frequent, please seek medical attention.   If you're able, please check your sugar if you have an episode, or have someone check it for you.   Let's try stopping the Glipizide    Syncope Syncope is when you temporarily lose consciousness. Syncope may also be called fainting or passing out. It is caused by a sudden decrease in blood flow to the brain. Even though most causes of syncope are not dangerous, syncope  can be a sign of a serious medical problem. Signs that you may be about to faint include:  Feeling dizzy or light-headed.  Feeling nauseous.  Seeing all white or all black in your field of vision.  Having cold, clammy skin.  If you fainted, get medical help right away.Call your local emergency services (911 in the [place] Do not drive yourself to the hospital. Follow these instructions at home: Pay attention to any changes in your symptoms. Take these actions to help with your condition:  Have someone stay with you until you feel stable.  Do not drive, use machinery, or play sports until your health care provider says it is okay.  Keep all follow-up visits as told by your health care provider. This is important.  If you start to feel like you might faint, lie down right away and raise (elevate) your feet above the level of your heart. Breathe deeply and steadily. Wait until all of the symptoms have passed.  Drink enough fluid to keep your urine clear or pale yellow.  If you are taking  blood pressure or heart medicine, get up slowly and take several minutes to sit and then stand. This can reduce dizziness.  Take over-the-counter and prescription medicines only as told by your health care provider.  Get help right away if:  You have a severe headache.  You have unusual pain in your chest, abdomen, or back.  You are bleeding from your mouth or rectum, or you have black or tarry stool.  You have a very fast or irregular heartbeat (palpitations).  You have pain with breathing.  You faint once or repeatedly.  You have a seizure.  You are confused.  You have trouble walking.  You have severe weakness.  You have vision problems. These symptoms may represent a serious problem that is an emergency. Do not wait to see if your symptoms will go away. Get medical help right away. Call your local emergency services (911 in the [place] Do not drive yourself to the hospital. This information is not intended to replace advice given to you by your health care provider. Make sure you discuss any questions you have with your health care provider. Document Released: 04/18/2005 Document Revised: 09/24/2015 Document Reviewed: 12/31/2014 Elsevier Interactive Patient Education  2018 [employer].     Follow-up plan: Return for recheck A1C 3 months, follow-up sooner if additional episodes .  Visit summary with medication list and pertinent instructions was printed for patient to review, alert [place] if any changes needed. All questions at time of visit were answered - patient instructed to contact office with any additional concerns. ER/RTC precautions were reviewed with the patient and understanding verbalized.   Note: Total time spent 25 minutes, greater than 50% of the visit was spent face-to-face counseling and coordinating care for the following: The primary encounter diagnosis was Type 2 diabetes mellitus without complication, with long-term current use of insulin ([place]). A diagnosis  of Syncope, unspecified syncope type was also pertinent to this visit.Marland Kitchen  Please note: voice recognition software was used to produce this document, and typos may escape review. Please contact Dr. Sheppard Coil for any needed clarifications.

## 2017-11-10 NOTE — Patient Instructions (Addendum)
Plan:  Your passing-out episodes sound like probably sugars dropping, but we should rule out cardiac issues, too.   EKG today looks ok.   Vital signs today look good. Your blood pressure isn't dropping when you sit or stand.   Will get labs today.   Will get you set up with an echocardiogram (ultrasound of the heart) and an ultrasound of the carotid arteries in the neck.   Will get you set up with a Holter monitor to look at heart rhythm over 48 hours.   Will get you set up with a cardiology appointment to see if heart monitoring or stress testing or other workup might be in order.  If the episode happens again, or episodes become worse/more frequent, please seek medical attention.   If you're able, please check your sugar if you have an episode, or have someone check it for you.   Let's try stopping the Glipizide    Syncope Syncope is when you temporarily lose consciousness. Syncope may also be called fainting or passing out. It is caused by a sudden decrease in blood flow to the brain. Even though most causes of syncope are not dangerous, syncope can be a sign of a serious medical problem. Signs that you may be about to faint include:  Feeling dizzy or light-headed.  Feeling nauseous.  Seeing all white or all black in your field of vision.  Having cold, clammy skin.  If you fainted, get medical help right away.Call your local emergency services (911 in the [place] Do not drive yourself to the hospital. Follow these instructions at home: Pay attention to any changes in your symptoms. Take these actions to help with your condition:  Have someone stay with you until you feel stable.  Do not drive, use machinery, or play sports until your health care provider says it is okay.  Keep all follow-up visits as told by your health care provider. This is important.  If you start to feel like you might faint, lie down right away and raise (elevate) your feet above the level of your  heart. Breathe deeply and steadily. Wait until all of the symptoms have passed.  Drink enough fluid to keep your urine clear or pale yellow.  If you are taking blood pressure or heart medicine, get up slowly and take several minutes to sit and then stand. This can reduce dizziness.  Take over-the-counter and prescription medicines only as told by your health care provider.  Get help right away if:  You have a severe headache.  You have unusual pain in your chest, abdomen, or back.  You are bleeding from your mouth or rectum, or you have black or tarry stool.  You have a very fast or irregular heartbeat (palpitations).  You have pain with breathing.  You faint once or repeatedly.  You have a seizure.  You are confused.  You have trouble walking.  You have severe weakness.  You have vision problems. These symptoms may represent a serious problem that is an emergency. Do not wait to see if your symptoms will go away. Get medical help right away. Call your local emergency services (911 in the [place] Do not drive yourself to the hospital. This information is not intended to replace advice given to you by your health care provider. Make sure you discuss any questions you have with your health care provider. Document Released: 04/18/2005 Document Revised: 09/24/2015 Document Reviewed: 12/31/2014 Elsevier Interactive Patient Education  [employer].

## 2017-11-11 ENCOUNTER — Telehealth: Payer: Self-pay | Admitting: Family Medicine

## 2017-11-11 DIAGNOSIS — E119 Type 2 diabetes mellitus without complications: Secondary | ICD-10-CM

## 2017-11-11 DIAGNOSIS — Z794 Long term (current) use of insulin: Secondary | ICD-10-CM

## 2017-11-11 DIAGNOSIS — Z79899 Other long term (current) drug therapy: Secondary | ICD-10-CM

## 2017-11-11 LAB — COMPLETE METABOLIC PANEL WITH GFR
AG Ratio: 1.3 (calc) (ref 1.0–2.5)
ALT: 47 U/L — ABNORMAL HIGH (ref 6–29)
AST: 58 U/L — ABNORMAL HIGH (ref 10–35)
Albumin: 4.1 g/dL (ref 3.6–5.1)
Alkaline phosphatase (APISO): 65 U/L (ref 33–130)
BUN: 11 mg/dL (ref 7–25)
CO2: 22 mmol/L (ref 20–32)
Calcium: 9.4 mg/dL (ref 8.6–10.4)
Chloride: 102 mmol/L (ref 98–110)
Creat: 0.69 mg/dL (ref 0.50–1.05)
GFR, Est African American: 114 mL/min/{1.73_m2} (ref >=60)
GFR, Est Non African American: 99 mL/min/{1.73_m2} (ref >=60)
Globulin: 3.1 g/dL (calc) (ref 1.9–3.7)
Glucose, Bld: 95 mg/dL (ref 65–99)
Potassium: 4.2 mmol/L (ref 3.5–5.3)
Sodium: 135 mmol/L (ref 135–146)
Total Bilirubin: 0.9 mg/dL (ref 0.2–1.2)
Total Protein: 7.2 g/dL (ref 6.1–8.1)

## 2017-11-11 LAB — URINALYSIS, ROUTINE W REFLEX MICROSCOPIC
Bacteria, UA: NONE SEEN /HPF
Bilirubin Urine: NEGATIVE
Glucose, UA: NEGATIVE
Hgb urine dipstick: NEGATIVE
Hyaline Cast: NONE SEEN /LPF
Ketones, ur: NEGATIVE
Leukocytes, UA: NEGATIVE
Nitrite: NEGATIVE
RBC / HPF: NONE SEEN /HPF (ref 0–2)
Specific Gravity, Urine: 1.019 (ref 1.001–1.03)
WBC, UA: NONE SEEN /HPF (ref 0–5)
pH: 5.5 (ref 5.0–8.0)

## 2017-11-11 LAB — CBC
HCT: 34.1 % — ABNORMAL LOW (ref 35.0–45.0)
Hemoglobin: 11.6 g/dL — ABNORMAL LOW (ref 11.7–15.5)
MCH: 29.4 pg (ref 27.0–33.0)
MCHC: 34 g/dL (ref 32.0–36.0)
MCV: 86.5 fL (ref 80.0–100.0)
MPV: 11.2 fL (ref 7.5–12.5)
Platelets: 111 10*3/uL — ABNORMAL LOW (ref 140–400)
RBC: 3.94 10*6/uL (ref 3.80–5.10)
RDW: 13.6 % (ref 11.0–15.0)
WBC: 5.5 10*3/uL (ref 3.8–10.8)

## 2017-11-11 LAB — MICROALBUMIN / CREATININE URINE RATIO
Creatinine, Urine: 128 mg/dL (ref 20–275)
Microalb Creat Ratio: 38 mcg/mg creat — ABNORMAL HIGH (ref <30)
Microalb, Ur: 4.9 mg/dL

## 2017-11-11 LAB — TSH: TSH: 3.02 mIU/L

## 2017-11-11 LAB — MAGNESIUM: Magnesium: 1 mg/dL — CL (ref 1.5–2.5)

## 2017-11-11 MED ORDER — MAGNESIUM OXIDE 400 MG PO TABS
400.0000 mg | ORAL_TABLET | Freq: Two times a day (BID) | ORAL | 0 refills | Status: DC
Start: 2017-11-11 — End: 2018-09-25

## 2017-11-11 NOTE — Telephone Encounter (Signed)
Received on call nurse call this am just a few minutes ago re: hypomagnesemia.   OV notes reviewed from recent OV and all labs reviewed.   Pt on metformin- which inc risk for this.   Called pt and got VM only -  left message that Mag level was low and that I called in supp for her to take BID into pharmacy on file.   Advised pt to call her PCP on Mon to obtain advice on other lab results.   Also told her she will need this Mag level rechecked in near future under advice of PCP.   Advised to call ON-call nurse with any further questions/ concerns.

## 2017-11-13 MED FILL — MAGNESIUM OXIDE 400 MG TAB: 400 (240 MG | 30 days supply | Qty: 60 | Fill #0

## 2017-11-14 NOTE — Addendum Note (Signed)
Addended by: Maryla Morrow on: 11/14/2017 01:46 PM   Modules accepted: Orders

## 2017-11-16 ENCOUNTER — Encounter: Payer: Self-pay | Admitting: Family Medicine

## 2017-11-16 ENCOUNTER — Ambulatory Visit (INDEPENDENT_AMBULATORY_CARE_PROVIDER_SITE_OTHER): Payer: No Typology Code available for payment source | Admitting: Family Medicine

## 2017-11-16 VITALS — BP 121/72 | HR 73 | Temp 97.9°F | Wt 156.0 lb

## 2017-11-16 DIAGNOSIS — M25562 Pain in left knee: Secondary | ICD-10-CM

## 2017-11-16 DIAGNOSIS — R202 Paresthesia of skin: Secondary | ICD-10-CM | POA: Diagnosis not present

## 2017-11-16 DIAGNOSIS — R790 Abnormal level of blood mineral: Secondary | ICD-10-CM | POA: Diagnosis not present

## 2017-11-16 DIAGNOSIS — E0800 Diabetes mellitus due to underlying condition with hyperosmolarity without nonketotic hyperglycemic-hyperosmolar coma (NKHHC): Secondary | ICD-10-CM

## 2017-11-16 DIAGNOSIS — M25561 Pain in right knee: Secondary | ICD-10-CM | POA: Diagnosis not present

## 2017-11-16 MED ORDER — INSULIN GLARGINE 100 UNIT/ML SOLOSTAR PEN: 12.0000 [IU] | PEN_INJECTOR | Freq: Every day | SUBCUTANEOUS | 3 refills | Status: DC

## 2017-11-16 MED FILL — LANTUS SOLOSTAR 100 UNITS/M: 100 | 85 days supply | Qty: 12 | Fill #0

## 2017-11-16 NOTE — Progress Notes (Signed)
Meagan Peck is a 57 y.o. female who presents to [place]: [place] today for follow-up left knee pain, diabetes.   Meagan Peck was seen about 6 weeks ago for left knee pain.  She was thought to have some DJD and was prescribed diclofenac gel and weight loss and quad strengthening exercises.  She notes significant symptom improvement.  She is feeling a lot better now.  She is trying to lose weight by eating a lower carbohydrate diet which is also benefiting her diabetes.  She feels great with how things are going.  She notes diabetes that she had a syncopal episode and her PCP advised her to discontinue glipizide.  She did so and notes that her blood sugar has not changed much at all.  She takes a diabetic regimen listed below without any hypoglycemic episodes.  As part of the syncope work-up she was found to have significantly low magnesium at 1.0.  Her PCP ordered a recheck lab which will be done today.  She denies any muscle twitching tenderness or perioral paresthesias.    ROS as above:  Exam:  BP 121/72 (BP Location: Left Arm, Patient Position: Sitting, Cuff Size: Normal)   Pulse 73   Temp 97.9 F (36.6 C) (Oral)   Wt 156 lb (70.8 kg)   LMP 12/03/2013   BMI 32.60 kg/m   Wt Readings from Last 5 Encounters:  11/16/17 156 lb (70.8 kg)  11/10/17 156 lb (70.8 kg)  10/05/17 158 lb (71.7 kg)  08/11/17 153 lb 1.3 oz (69.4 kg)  06/06/17 160 lb (72.6 kg)    Gen: Well NAD HEENT: EOMI,  MMM Lungs: Normal work of breathing. CTABL Heart: RRR no MRG Abd: NABS, Soft. Nondistended, Nontender Exts: Brisk capillary refill, warm and well perfused.  Knees bilaterally relatively normal-appearing without effusion.  Normal motion.  Normal gait.   Lab and Radiology Results  Labs reviewed from July 12    Assessment and Plan: 57 y.o. female with  Knee pain: Significant improvement.   Continue to work on quad strength including exercise bike options.  Additionally diclofenac gel and weight loss.  Recheck as needed.  Diabetes: Doing well.  Emphasized low carbohydrate diet.  Agree with discontinue glipizide.  Continue to monitor.  And follow-up with PCP as scheduled.  Send me update about blood sugars in 1 week.  Low magnesium: Recheck magnesium as scheduled.  Additional check parathyroid hormone and calcium.   Orders Placed This Encounter  Procedures  . PTH, Intact and Calcium   Meds ordered this encounter  Medications  . Insulin Glargine (LANTUS SOLOSTAR) 100 UNIT/ML Solostar Pen    Sig: Inject 12-14 Units into the skin daily. Per protocol    Dispense:  15 mL    Refill:  3     Historical information moved to improve visibility of documentation.  Past Medical History:  Diagnosis Date  . Diabetes mellitus without complication ([place])   . GERD (gastroesophageal reflux disease)   . Hypertension    Past Surgical History:  Procedure Laterality Date  . CARPAL TUNNEL RELEASE     right hand  . CESAREAN SECTION    . CHOLECYSTECTOMY    . KNEE ARTHROSCOPY Right 12/10/2015   Procedure: RIGHT KNEE ARTHROSCOPY WITH PARTIAL MEDIAL MENISCECTOMY;  Surgeon: Mcarthur Rossetti, MD;  Location: [facility];  Service: Orthopedics;  Laterality: Right;  . KNEE ARTHROSCOPY W/ MENISCAL REPAIR     left knee   Social  History   Tobacco Use  . Smoking status: Never Smoker  . Smokeless tobacco: Never Used  Substance Use Topics  . Alcohol use: No   family history includes Alcoholism in her father, maternal uncle, and paternal uncle; COPD in her mother; Diabetes in her brother and paternal grandmother; Heart attack in her father; Hypertension in her mother.  Medications: Current Outpatient Medications  Medication Sig Dispense Refill  . ACCU-CHEK FASTCLIX LANCETS MISC Check fsbs TID    . aspirin 81 MG tablet Take 81 mg by mouth daily.    . Cholecalciferol (VITAMIN D3) 5000 UNITS  TABS Take 5,000 Units by mouth daily.     . diclofenac sodium (VOLTAREN) 1 % GEL Apply 4 g topically 4 (four) times daily. To affected joint. 100 g 11  . FeFum-FePoly-FA-B Cmp-C-Biot (FOLIVANE-PLUS) CAPS Take 1 capsule by mouth daily.  5  . hydrochlorothiazide (HYDRODIURIL) 25 MG tablet Take 1 tablet (25 mg total) by mouth daily. 90 tablet 3  . ibuprofen (ADVIL,MOTRIN) 800 MG tablet Take 1 tablet (800 mg total) by mouth every 8 (eight) hours as needed for moderate pain. 60 tablet 0  . insulin lispro (HUMALOG) 100 UNIT/ML KiwkPen Inject 0.1 mLs (10 Units total) into the skin daily. Prior to largest meal of the day 15 mL 5  . Insulin Pen Needle (UNIFINE PENTIPS) 31G X 8 MM MISC USE AS DIRECTED WITH LANTUS AND BYETTA 100 each 12  . ipratropium (ATROVENT) 0.06 % nasal spray Place 1 spray into both nostrils 4 (four) times daily as needed. 15 mL 0  . Krill Oil 500 MG CAPS Take 500 mg by mouth daily.    Marland Kitchen lisinopril (PRINIVIL,ZESTRIL) 40 MG tablet Take 40 mg by mouth daily.    . magnesium oxide (MAG-OX) 400 MG tablet Take 1 tablet (400 mg total) by mouth 2 (two) times daily. 60 tablet 0  . metFORMIN (GLUCOPHAGE) 1000 MG tablet Take 1 tablet (1,000 mg total) by mouth 2 (two) times daily with a meal. 90 tablet 3  . rosuvastatin (CRESTOR) 20 MG tablet Take 20 mg by mouth 3 (three) times a week.    Marland Kitchen VICTOZA 18 MG/3ML SOPN INJECT 0 2 MLS  1 2 MG TOTAL  INTO THE SKIN DAILY  5  . Insulin Glargine (LANTUS SOLOSTAR) 100 UNIT/ML Solostar Pen Inject 12-14 Units into the skin daily. Per protocol 15 mL 3   No current facility-administered medications for this visit.    Allergies  Allergen Reactions  . Penicillins Anaphylaxis    Happened as a young child  . Atrovent [Ipratropium] Palpitations    As per patient  . Naproxen Rash  . Quinine Derivatives Rash  . Sulfa Antibiotics Rash     Discussed warning signs or symptoms. Please see discharge instructions. Patient expresses understanding.

## 2017-11-16 NOTE — Patient Instructions (Addendum)
Thank you for coming in today. Continue diclofenac gel as needed.  I am happy to continue refills.  Continue quad strength exercises.  Weight loss helps both diabetes and knee arthritis pain.   Get labs for Magnesium and parathyroid and calcium .   I will get results to you ASAP.   Recheck with me as needed.

## 2017-11-17 LAB — PTH, INTACT AND CALCIUM
Calcium: 9.3 mg/dL (ref 8.6–10.4)
PTH: 26 pg/mL (ref 14–64)

## 2017-11-17 LAB — MAGNESIUM: Magnesium: 1.3 mg/dL — ABNORMAL LOW (ref 1.5–2.5)

## 2017-11-17 NOTE — Progress Notes (Signed)
Pt has seen results on MyChart and message also sent for patient to call back if any questions.

## 2017-11-21 ENCOUNTER — Ambulatory Visit: Payer: No Typology Code available for payment source

## 2017-11-21 DIAGNOSIS — R55 Syncope and collapse: Secondary | ICD-10-CM | POA: Diagnosis not present

## 2017-11-21 NOTE — Addendum Note (Signed)
Addended by: Clemetine Marker A on: 11/21/2017 04:11 PM   Modules accepted: Orders

## 2017-11-23 ENCOUNTER — Encounter: Payer: Self-pay | Admitting: Family Medicine

## 2017-11-23 MED FILL — HYDROCHLOROTHIAZIDE 25 MG T: 25 | 90 days supply | Qty: 90 | Fill #1

## 2017-11-24 ENCOUNTER — Ambulatory Visit (HOSPITAL_BASED_OUTPATIENT_CLINIC_OR_DEPARTMENT_OTHER)
Admission: RE | Admit: 2017-11-24 | Discharge: 2017-11-24 | Disposition: A | Discharge status: Home / Self Care | Payer: No Typology Code available for payment source | Source: Ambulatory Visit | Attending: Osteopathic Medicine | Admitting: Osteopathic Medicine

## 2017-11-24 ENCOUNTER — Telehealth: Payer: Self-pay

## 2017-11-24 DIAGNOSIS — E119 Type 2 diabetes mellitus without complications: Secondary | ICD-10-CM | POA: Insufficient documentation

## 2017-11-24 DIAGNOSIS — R55 Syncope and collapse: Secondary | ICD-10-CM

## 2017-11-24 DIAGNOSIS — I119 Hypertensive heart disease without heart failure: Secondary | ICD-10-CM | POA: Diagnosis not present

## 2017-11-24 DIAGNOSIS — I6523 Occlusion and stenosis of bilateral carotid arteries: Secondary | ICD-10-CM | POA: Diagnosis not present

## 2017-11-24 DIAGNOSIS — E0800 Diabetes mellitus due to underlying condition with hyperosmolarity without nonketotic hyperglycemic-hyperosmolar coma (NKHHC): Secondary | ICD-10-CM

## 2017-11-24 DIAGNOSIS — E785 Hyperlipidemia, unspecified: Secondary | ICD-10-CM | POA: Insufficient documentation

## 2017-11-24 MED ORDER — VICTOZA 18 MG/3ML ~~LOC~~ SOPN: PEN_INJECTOR | SUBCUTANEOUS | 5 refills | Status: DC

## 2017-11-24 MED ORDER — LISINOPRIL 40 MG PO TABS: 40.0000 mg | ORAL_TABLET | Freq: Every day | ORAL | 1 refills | Status: DC

## 2017-11-24 MED FILL — VICTOZA 2-PAK 18 MG/3 ML PE: 18 | 30 days supply | Qty: 6 | Fill #0

## 2017-11-24 MED FILL — LISINOPRIL 40 MG TABS: 40 | 90 days supply | Qty: 90 | Fill #0

## 2017-11-24 NOTE — Telephone Encounter (Addendum)
[facility] pharmacy requesting med RF for victoza 2-pack pen & lisinopril. Rxs written by historical provider. Thanks.

## 2017-11-24 NOTE — Progress Notes (Signed)
VAS US CAROTID DUPLEX PERFORMED   Moderate focal plaque visualized at right proximal ICA and mild focal plaque visualized at left proxima ICA  which is indicative of 1-39 % Stenosis bilaterally    11/24/17 Cardell Peach RDCS, RVT

## 2017-11-24 NOTE — Telephone Encounter (Signed)
Meds sent

## 2017-11-24 NOTE — Telephone Encounter (Signed)
Left a brief vm msg for pt regarding med RF sent to pharmacy. Call back information provided.

## 2017-11-24 NOTE — Progress Notes (Signed)
  Echocardiogram 2D Echocardiogram has been performed.  Meagan Peck 11/24/2017, 11:20 AM

## 2017-11-27 ENCOUNTER — Encounter: Payer: Self-pay | Admitting: *Deleted

## 2017-11-27 ENCOUNTER — Ambulatory Visit (INDEPENDENT_AMBULATORY_CARE_PROVIDER_SITE_OTHER): Payer: No Typology Code available for payment source | Admitting: Cardiology

## 2017-11-27 ENCOUNTER — Encounter: Payer: Self-pay | Admitting: Cardiology

## 2017-11-27 VITALS — BP 128/58 | HR 79 | Ht <= 58 in | Wt 152.8 lb

## 2017-11-27 DIAGNOSIS — E785 Hyperlipidemia, unspecified: Secondary | ICD-10-CM

## 2017-11-27 DIAGNOSIS — Z794 Long term (current) use of insulin: Secondary | ICD-10-CM

## 2017-11-27 DIAGNOSIS — I10 Essential (primary) hypertension: Secondary | ICD-10-CM | POA: Diagnosis not present

## 2017-11-27 DIAGNOSIS — I259 Chronic ischemic heart disease, unspecified: Secondary | ICD-10-CM

## 2017-11-27 DIAGNOSIS — R079 Chest pain, unspecified: Secondary | ICD-10-CM

## 2017-11-27 DIAGNOSIS — E119 Type 2 diabetes mellitus without complications: Secondary | ICD-10-CM | POA: Diagnosis not present

## 2017-11-27 DIAGNOSIS — R55 Syncope and collapse: Secondary | ICD-10-CM

## 2017-11-27 NOTE — Patient Instructions (Signed)
Medication Instructions:  Your physician has recommended you make the following change in your medication:  STOP hydrochlorothiazide  CONTINUE lisinopril 40 mg daily: take this medication in the morning!  Labwork: None  Testing/Procedures: Your physician has requested that you have en exercise stress myoview. For further information please visit [email or web address]. Please follow instruction sheet, as given.  Follow-Up: Your physician wants you to follow-up in: 4 months. You will receive a reminder letter in the mail two months in advance. If you don't receive a letter, please call our office to schedule the follow-up appointment.   If you need a refill on your cardiac medications before your next appointment, please call your pharmacy.   Thank you for choosing [facility]! Robyne Peers, RN [phone]    Cardiac Nuclear Scan A cardiac nuclear scan is a test that measures blood flow to the heart when a person is resting and when he or she is exercising. The test looks for problems such as:  Not enough blood reaching a portion of the heart.  The heart muscle not working normally.  You may need this test if:  You have heart disease.  You have had abnormal lab results.  You have had heart surgery or angioplasty.  You have chest pain.  You have shortness of breath.  In this test, a radioactive dye (tracer) is injected into your bloodstream. After the tracer has traveled to your heart, an imaging device is used to measure how much of the tracer is absorbed by or distributed to various areas of your heart. This procedure is usually done at a hospital and takes 2-4 hours. Tell a health care provider about:  Any allergies you have.  All medicines you are taking, including vitamins, herbs, eye drops, creams, and over-the-counter medicines.  Any problems you or family members have had with the use of anesthetic medicines.  Any blood disorders you have.  Any  surgeries you have had.  Any medical conditions you have.  Whether you are pregnant or may be pregnant. What are the risks? Generally, this is a safe procedure. However, problems may occur, including:  Serious chest pain and heart attack. This is only a risk if the stress portion of the test is done.  Rapid heartbeat.  Sensation of warmth in your chest. This usually passes quickly.  What happens before the procedure?  Ask your health care provider about changing or stopping your regular medicines. This is especially important if you are taking diabetes medicines or blood thinners.  Remove your jewelry on the day of the procedure. What happens during the procedure?  An IV tube will be inserted into one of your veins.  Your health care provider will inject a small amount of radioactive tracer through the tube.  You will wait for 20-40 minutes while the tracer travels through your bloodstream.  Your heart activity will be monitored with an electrocardiogram (ECG).  You will lie down on an exam table.  Images of your heart will be taken for about 15-20 minutes.  You may be asked to exercise on a treadmill or stationary bike. While you exercise, your heart's activity will be monitored with an ECG, and your blood pressure will be checked. If you are unable to exercise, you may be given a medicine to increase blood flow to parts of your heart.  When blood flow to your heart has peaked, a tracer will again be injected through the IV tube.  After 20-40 minutes, you will get back on the  exam table and have more images taken of your heart.  When the procedure is over, your IV tube will be removed. The procedure may vary among health care providers and hospitals. Depending on the type of tracer used, scans may need to be repeated 3-4 hours later. What happens after the procedure?  Unless your health care provider tells you otherwise, you may return to your normal schedule, including  diet, activities, and medicines.  Unless your health care provider tells you otherwise, you may increase your fluid intake. This will help flush the contrast dye from your body. Drink enough fluid to keep your urine clear or pale yellow.  It is up to you to get your test results. Ask your health care provider, or the department that is doing the test, when your results will be ready. Summary  A cardiac nuclear scan measures the blood flow to the heart when a person is resting and when he or she is exercising.  You may need this test if you are at risk for heart disease.  Tell your health care provider if you are pregnant.  Unless your health care provider tells you otherwise, increase your fluid intake. This will help flush the contrast dye from your body. Drink enough fluid to keep your urine clear or pale yellow. This information is not intended to replace advice given to you by your health care provider. Make sure you discuss any questions you have with your health care provider. Document Released: 05/13/2004 Document Revised: 04/20/2016 Document Reviewed: 03/27/2013 Elsevier Interactive Patient Education  2017 [place].   Lisinopril tablets What is this medicine? LISINOPRIL (lyse IN oh pril) is an ACE inhibitor. This medicine is used to treat high blood pressure and heart failure. It is also used to protect the heart immediately after a heart attack. This medicine may be used for other purposes; ask your health care provider or pharmacist if you have questions. COMMON BRAND NAME(S): Prinivil, Zestril What should I tell my health care provider before I take this medicine? They need to know if you have any of these conditions: -diabetes -heart or blood vessel disease -kidney disease -low blood pressure -previous swelling of the tongue, face, or lips with difficulty breathing, difficulty swallowing, hoarseness, or tightening of the throat -an unusual or allergic reaction to  lisinopril, other ACE inhibitors, insect venom, foods, dyes, or preservatives -pregnant or trying to get pregnant -breast-feeding How should I use this medicine? Take this medicine by mouth with a glass of water. Follow the directions on your prescription label. You may take this medicine with or without food. If it upsets your stomach, take it with food. Take your medicine at regular intervals. Do not take it more often than directed. Do not stop taking except on your doctor's advice. Talk to your pediatrician regarding the use of this medicine in children. Special care may be needed. While this drug may be prescribed for children as young as 27 years of age for selected conditions, precautions do apply. Overdosage: If you think you have taken too much of this medicine contact a poison control center or emergency room at once. NOTE: This medicine is only for you. Do not share this medicine with others. What if I miss a dose? If you miss a dose, take it as soon as you can. If it is almost time for your next dose, take only that dose. Do not take double or extra doses. What may interact with this medicine? Do not take this  medicine with any of the following medications: -hymenoptera venom -sacubitril; valsartan This medicines may also interact with the following medications: -aliskiren -angiotensin receptor blockers, like losartan or valsartan -certain medicines for diabetes -diuretics -everolimus -gold compounds -lithium -NSAIDs, medicines for pain and inflammation, like ibuprofen or naproxen -potassium salts or supplements -salt substitutes -sirolimus -temsirolimus This list may not describe all possible interactions. Give your health care provider a list of all the medicines, herbs, non-prescription drugs, or dietary supplements you use. Also tell them if you smoke, drink alcohol, or use illegal drugs. Some items may interact with your medicine. What should I watch for while using this  medicine? Visit your doctor or health care professional for regular check ups. Check your blood pressure as directed. Ask your doctor what your blood pressure should be, and when you should contact him or her. Do not treat yourself for coughs, colds, or pain while you are using this medicine without asking your doctor or health care professional for advice. Some ingredients may increase your blood pressure. Women should inform their doctor if they wish to become pregnant or think they might be pregnant. There is a potential for serious side effects to an unborn child. Talk to your health care professional or pharmacist for more information. Check with your doctor or health care professional if you get an attack of severe diarrhea, nausea and vomiting, or if you sweat a lot. The loss of too much body fluid can make it dangerous for you to take this medicine. You may get drowsy or dizzy. Do not drive, use machinery, or do anything that needs mental alertness until you know how this drug affects you. Do not stand or sit up quickly, especially if you are an older patient. This reduces the risk of dizzy or fainting spells. Alcohol can make you more drowsy and dizzy. Avoid alcoholic drinks. Avoid salt substitutes unless you are told otherwise by your doctor or health care professional. What side effects may I notice from receiving this medicine? Side effects that you should report to your doctor or health care professional as soon as possible: -allergic reactions like skin rash, itching or hives, swelling of the hands, feet, face, lips, throat, or tongue -breathing problems -signs and symptoms of kidney injury like trouble passing urine or change in the amount of urine -signs and symptoms of increased potassium like muscle weakness; chest pain; or fast, irregular heartbeat -signs and symptoms of liver injury like dark yellow or brown urine; general ill feeling or flu-like symptoms; light-colored stools; loss  of appetite; nausea; right upper belly pain; unusually weak or tired; yellowing of the eyes or skin -signs and symptoms of low blood pressure like dizziness; feeling faint or lightheaded, falls; unusually weak or tired -stomach pain with or without nausea and vomiting Side effects that usually do not require medical attention (report to your doctor or health care professional if they continue or are bothersome): -changes in taste -cough -dizziness -fever -headache -sensitivity to light This list may not describe all possible side effects. Call your doctor for medical advice about side effects. You may report side effects to FDA at 1-800-FDA-1088. Where should I keep my medicine? Keep out of the reach of children. Store at room temperature between 15 and 30 degrees C (59 and 86 degrees F). Protect from moisture. Keep container tightly closed. Throw away any unused medicine after the expiration date. NOTE: This sheet is a summary. It may not cover all possible information. If you have questions about  this medicine, talk to your doctor, pharmacist, or health care provider.  2018 Elsevier/Gold Standard (2015-06-08 12:52:35)

## 2017-11-27 NOTE — Progress Notes (Signed)
Cardiology Office Note:    Date:  11/27/2017   ID:  Meagan Peck, DOB [birth year], MRN [id number]  PCP:  [name], DO  Cardiologist:  Jenean Lindau, MD   Referring MD: [name], DO    ASSESSMENT:    1. Chest pain due to myocardial ischemia, unspecified ischemic chest pain type   2. Benign essential HTN   3. Type 2 diabetes mellitus without complication, with long-term current use of insulin ([place])   4. Dyslipidemia   5. Syncope, unspecified syncope type    PLAN:    In order of problems listed above:  1. Primary prevention stressed with the patient.  Importance of compliance with diet and medication stressed and she vocalized understanding.  Her blood pressure is stable. 2. Diet was discussed for dyslipidemia and diabetes mellitus.  Weight reduction was urged. 3. In view of her chest pain symptoms she will undergo exercise stress Cardiolite. 4. Echocardiogram was unremarkable and I discussed the reports with her.  Holter monitor reports are pending and we will review them when available. 5. Patient was advised to stop taking hydrochlorothiazide.  She is to take it at night.  She will take only lisinopril and that she will take in the morning. 6. Patient will be seen in follow-up appointment in 3 months or earlier if the patient has any concerns 7. She knows to go to the nearest emergency room for any significant concerns.   Medication Adjustments/Labs and Tests Ordered: Current medicines are reviewed at length with the patient today.  Concerns regarding medicines are outlined above.  No orders of the defined types were placed in this encounter.  No orders of the defined types were placed in this encounter.    History of Present Illness:    Meagan Peck is a 57 y.o. female who is being seen today for the evaluation of syncope and chest pain at the request of [name], DO.  Patient is a pleasant 57 year old female.  She has past medical  history of essential hypertension, dyslipidemia and diabetes mellitus.  She leads a sedentary lifestyle.  She mentions to me that she has had on 4 or 5 occasions  passing out spells especially when she wakes up at night to go to the bathroom.  The timing of this is consistent.  She denies any orthopnea or PND.  She occasionally complains of chest pain which is like tightness but no radiation.  It may or may not occur with exertion.  The last time she walked was about 30 minutes a week ago with her husband.  With this she developed no symptoms.  At the time of my evaluation, the patient is alert awake oriented and in no distress.  Past Medical History:  Diagnosis Date  . Diabetes mellitus without complication ([place])   . GERD (gastroesophageal reflux disease)   . Hypertension     Past Surgical History:  Procedure Laterality Date  . CARPAL TUNNEL RELEASE     right hand  . CESAREAN SECTION    . CHOLECYSTECTOMY    . KNEE ARTHROSCOPY Right 12/10/2015   Procedure: RIGHT KNEE ARTHROSCOPY WITH PARTIAL MEDIAL MENISCECTOMY;  Surgeon: Mcarthur Rossetti, MD;  Location: [facility];  Service: Orthopedics;  Laterality: Right;  . KNEE ARTHROSCOPY W/ MENISCAL REPAIR     left knee    Current Medications: Current Meds  Medication Sig  . ACCU-CHEK FASTCLIX LANCETS MISC Check fsbs TID  . aspirin 81 MG tablet Take 81 mg by mouth  daily.  . Cholecalciferol (VITAMIN D3) 5000 UNITS TABS Take 5,000 Units by mouth daily.   . diclofenac sodium (VOLTAREN) 1 % GEL Apply 4 g topically 4 (four) times daily. To affected joint.  . FeFum-FePoly-FA-B Cmp-C-Biot (FOLIVANE-PLUS) CAPS Take 1 capsule by mouth daily.  . hydrochlorothiazide (HYDRODIURIL) 25 MG tablet Take 1 tablet (25 mg total) by mouth daily.  Marland Kitchen ibuprofen (ADVIL,MOTRIN) 800 MG tablet Take 1 tablet (800 mg total) by mouth every 8 (eight) hours as needed for moderate pain.  . Insulin Glargine (LANTUS SOLOSTAR) 100 UNIT/ML Solostar Pen Inject 12-14 Units into the  skin daily. Per protocol  . insulin lispro (HUMALOG) 100 UNIT/ML KiwkPen Inject 0.1 mLs (10 Units total) into the skin daily. Prior to largest meal of the day  . Insulin Pen Needle (UNIFINE PENTIPS) 31G X 8 MM MISC USE AS DIRECTED WITH LANTUS AND BYETTA  . Krill Oil 500 MG CAPS Take 500 mg by mouth daily.  Marland Kitchen lisinopril (PRINIVIL,ZESTRIL) 40 MG tablet Take 1 tablet (40 mg total) by mouth daily.  . magnesium oxide (MAG-OX) 400 MG tablet Take 1 tablet (400 mg total) by mouth 2 (two) times daily.  . metFORMIN (GLUCOPHAGE) 1000 MG tablet Take 1 tablet (1,000 mg total) by mouth 2 (two) times daily with a meal.  . rosuvastatin (CRESTOR) 20 MG tablet Take 20 mg by mouth 3 (three) times a week.  Marland Kitchen VICTOZA 18 MG/3ML SOPN INJECT 0 2 MLS  1 2 MG TOTAL  INTO THE SKIN DAILY     Allergies:   Penicillins; Atrovent [ipratropium]; Naproxen; Quinine derivatives; and Sulfa antibiotics   Social History   Socioeconomic History  . Marital status: Married    Spouse name: Dorine Duffey  . Number of children: 1  . Years of education: Not on file  . Highest education level: Associate degree: academic program  Occupational History  . Not on file  Social Needs  . Financial resource strain: Not on file  . Food insecurity:    Worry: Not on file    Inability: Not on file  . Transportation needs:    Medical: Not on file    Non-medical: Not on file  Tobacco Use  . Smoking status: Never Smoker  . Smokeless tobacco: Never Used  Substance and Sexual Activity  . Alcohol use: No  . Drug use: No  . Sexual activity: Yes    Partners: Male    Birth control/protection: None  Lifestyle  . Physical activity:    Days per week: 0 days    Minutes per session: Not on file  . Stress: Not on file  Relationships  . Social connections:    Talks on phone: Not on file    Gets together: Not on file    Attends religious service: Not on file    Active member of club or organization: Not on file    Attends meetings of  clubs or organizations: Not on file    Relationship status: Not on file  Other Topics Concern  . Not on file  Social History Narrative  . Not on file     Family History: The patient's family history includes Alcoholism in her father, maternal uncle, and paternal uncle; COPD in her mother; Diabetes in her brother and paternal grandmother; Heart attack in her father; Hypertension in her mother.  ROS:   Please see the history of present illness.    All other systems reviewed and are negative.  EKGs/Labs/Other Studies Reviewed:  The following studies were reviewed today: EKG reveals sinus rhythm with nonspecific ST-T changes.   Recent Labs: 11/10/2017: ALT 47; BUN 11; Creat 0.69; Hemoglobin 11.6; Platelets 111; Potassium 4.2; Sodium 135; TSH 3.02 11/16/2017: Magnesium 1.3  Recent Lipid Panel No results found for: CHOL, TRIG, HDL, CHOLHDL, VLDL, LDLCALC, LDLDIRECT  Physical Exam:    VS:  BP (!) 128/58   Pulse 79   Ht 4\' 10"  (1.473 m)   Wt 152 lb 12.8 oz (69.3 kg)   LMP 12/03/2013   SpO2 98%   BMI 31.94 kg/m     Wt Readings from Last 3 Encounters:  11/27/17 152 lb 12.8 oz (69.3 kg)  11/16/17 156 lb (70.8 kg)  11/10/17 156 lb (70.8 kg)     GEN: Patient is in no acute distress HEENT: Normal NECK: No JVD; No carotid bruits LYMPHATICS: No lymphadenopathy CARDIAC: S1 S2 regular, 2/6 systolic murmur at the apex. RESPIRATORY:  Clear to auscultation without rales, wheezing or rhonchi  ABDOMEN: Soft, non-tender, non-distended MUSCULOSKELETAL:  No edema; No deformity  SKIN: Warm and dry NEUROLOGIC:  Alert and oriented x 3 PSYCHIATRIC:  Normal affect    Signed, Jenean Lindau, MD  11/27/2017 3:31 PM    [facility]

## 2017-11-27 NOTE — Addendum Note (Signed)
Addended by: Austin Miles on: 11/27/2017 03:47 PM   Modules accepted: Orders

## 2017-11-28 ENCOUNTER — Telehealth (HOSPITAL_COMMUNITY): Payer: Self-pay | Admitting: *Deleted

## 2017-11-28 NOTE — Telephone Encounter (Signed)
Left message on voicemail per DPR in reference to upcoming appointment scheduled on 11/29/17 with detailed instructions given per Myocardial Perfusion Study Information Sheet for the test. LM to arrive 15 minutes early, and that it is imperative to arrive on time for appointment to keep from having the test rescheduled. If you need to cancel or reschedule your appointment, please call the office within 24 hours of your appointment. Failure to do so may result in a cancellation of your appointment, and a $50 no show fee. Phone number given for call back for any questions. Kirstie Peri

## 2017-11-29 ENCOUNTER — Ambulatory Visit (HOSPITAL_COMMUNITY): Payer: No Typology Code available for payment source | Attending: Internal Medicine

## 2017-11-29 VITALS — Ht <= 58 in | Wt 152.0 lb

## 2017-11-29 DIAGNOSIS — R9439 Abnormal result of other cardiovascular function study: Secondary | ICD-10-CM | POA: Diagnosis not present

## 2017-11-29 DIAGNOSIS — I2510 Atherosclerotic heart disease of native coronary artery without angina pectoris: Secondary | ICD-10-CM | POA: Insufficient documentation

## 2017-11-29 DIAGNOSIS — R079 Chest pain, unspecified: Secondary | ICD-10-CM | POA: Diagnosis present

## 2017-11-29 DIAGNOSIS — E119 Type 2 diabetes mellitus without complications: Secondary | ICD-10-CM | POA: Diagnosis not present

## 2017-11-29 DIAGNOSIS — R55 Syncope and collapse: Secondary | ICD-10-CM | POA: Diagnosis not present

## 2017-11-29 DIAGNOSIS — Z8249 Family history of ischemic heart disease and other diseases of the circulatory system: Secondary | ICD-10-CM | POA: Diagnosis not present

## 2017-11-29 DIAGNOSIS — I10 Essential (primary) hypertension: Secondary | ICD-10-CM | POA: Diagnosis not present

## 2017-11-29 LAB — MYOCARDIAL PERFUSION IMAGING
Estimated workload: 7 METS
Exercise duration (min): 5 min
Exercise duration (sec): 0 s
LV dias vol: 59 mL (ref 46–106)
LV sys vol: 16 mL
MPHR: 166 {beats}/min
Peak HR: 153 {beats}/min
Percent HR: 92 %
RATE: 0.35
Rest HR: 72 {beats}/min
SDS: 4
SRS: 5
SSS: 9
TID: 0.96

## 2017-11-29 MED ORDER — TECHNETIUM TC 99M TETROFOSMIN IV KIT
31.5000 | PACK | Freq: Once | INTRAVENOUS | Status: AC | PRN
  Administered 2017-11-29: 31.5 via INTRAVENOUS
  Filled 2017-11-29: qty 32

## 2017-11-29 MED ORDER — TECHNETIUM TC 99M TETROFOSMIN IV KIT
10.2000 | PACK | Freq: Once | INTRAVENOUS | Status: AC | PRN
  Administered 2017-11-29: 10.2 via INTRAVENOUS
  Filled 2017-11-29: qty 11

## 2017-11-30 ENCOUNTER — Encounter (INDEPENDENT_AMBULATORY_CARE_PROVIDER_SITE_OTHER): Payer: Self-pay

## 2017-11-30 ENCOUNTER — Encounter: Payer: Self-pay | Admitting: *Deleted

## 2017-12-05 MED FILL — ULTICARE PEN NDL 8MM 31G: 31G X 8 MM | 30 days supply | Qty: 100 | Fill #2

## 2017-12-05 MED FILL — DICLOFENAC SODIUM 1% GEL: 1 | 7 days supply | Qty: 100 | Fill #1

## 2017-12-07 ENCOUNTER — Telehealth: Payer: Self-pay

## 2017-12-07 MED ORDER — FOLIVANE-F 125-1 MG PO CAPS
1.0000 | ORAL_CAPSULE | Freq: Every day | ORAL | 3 refills | Status: DC
Start: 2017-12-07 — End: 2018-10-23

## 2017-12-07 MED FILL — FOLIVANE-F CAPSULE: 125-1 | 60 days supply | Qty: 60 | Fill #0

## 2017-12-07 NOTE — Telephone Encounter (Signed)
[facility] requesting med RF for folivane-plus capsule. Medication written by historical provider.

## 2017-12-07 NOTE — Telephone Encounter (Signed)
Sent!

## 2017-12-14 ENCOUNTER — Other Ambulatory Visit: Payer: Self-pay | Admitting: Osteopathic Medicine

## 2017-12-15 ENCOUNTER — Other Ambulatory Visit: Payer: Self-pay

## 2017-12-15 MED ORDER — METFORMIN HCL 1000 MG PO TABS: 1000.0000 mg | ORAL_TABLET | Freq: Two times a day (BID) | ORAL | 3 refills | Status: DC

## 2017-12-15 MED FILL — metFORMIN HCL 1000 MG TABS: 1000 | 45 days supply | Qty: 90 | Fill #0

## 2018-01-02 MED FILL — VICTOZA 2-PAK 18 MG/3 ML PE: 18 | 30 days supply | Qty: 6 | Fill #1

## 2018-01-17 MED FILL — ULTICARE PEN NDL 8MM 31G: 31G X 8 MM | 30 days supply | Qty: 100 | Fill #3

## 2018-01-25 MED FILL — HUMALOG 100 UNITS/ML KWIKPE: 100 | 90 days supply | Qty: 9 | Fill #1

## 2018-01-30 MED FILL — metFORMIN HCL 1000 MG TABS: 1000 | 45 days supply | Qty: 90 | Fill #1

## 2018-01-30 MED FILL — VICTOZA 2-PAK 18 MG/3 ML PE: 18 | 30 days supply | Qty: 6 | Fill #2

## 2018-02-09 ENCOUNTER — Ambulatory Visit (INDEPENDENT_AMBULATORY_CARE_PROVIDER_SITE_OTHER): Payer: No Typology Code available for payment source | Admitting: Osteopathic Medicine

## 2018-02-09 ENCOUNTER — Encounter: Payer: Self-pay | Admitting: Osteopathic Medicine

## 2018-02-09 VITALS — BP 119/62 | HR 73 | Temp 98.0°F | Wt 151.9 lb

## 2018-02-09 DIAGNOSIS — E0800 Diabetes mellitus due to underlying condition with hyperosmolarity without nonketotic hyperglycemic-hyperosmolar coma (NKHHC): Secondary | ICD-10-CM

## 2018-02-09 DIAGNOSIS — R197 Diarrhea, unspecified: Secondary | ICD-10-CM | POA: Diagnosis not present

## 2018-02-09 LAB — POCT GLYCOSYLATED HEMOGLOBIN (HGB A1C): Hemoglobin A1C: 6.3 % — AB (ref 4.0–5.6)

## 2018-02-09 MED ORDER — LOPERAMIDE HCL 2 MG PO CAPS: 2.0000 mg | ORAL_CAPSULE | ORAL | 0 refills | Status: DC | PRN

## 2018-02-09 MED FILL — SM ANTI-DIARRHEAL 2 MG CAPL: 2 | 12 days supply | Qty: 48 | Fill #0

## 2018-02-09 NOTE — Patient Instructions (Signed)
Plan: We will get blood work today to evaluate for possible liver/pancreas problems or infection causing frequent loose stool.  Will get stool tests if needed.  Will trial Imodium prior to meals.  I suspect more of a functional bowel problem such as irritable bowel.  If symptoms worsen or do not respond to medications, will proceed with further work-up such as imaging and/or specialist referral

## 2018-02-09 NOTE — Progress Notes (Signed)
HPI: Meagan Peck is a 57 y.o. female who  has a past medical history of Diabetes mellitus without complication ([place]), GERD (gastroesophageal reflux disease), and Hypertension.  she presents to [facility] today, 02/09/18,  for chief complaint of:  Follow-up Diabetes  New problem: Frequent loose stool  Diabetes: Medications now:   Humalog 10 Units daily w/ largest meal   Lantus 14 Units daily   Victoza 1.2 mg daily   Metformin 1000 mg bid  A1C records  at [facility], 02/13/2017. Foot Exam was performed at that visit. A1c at that point was 8.0%.   A1c was looking a little bit better here 05/15/17 at 7.6%.  08/11/17: 7.1% yay! No changes made to meds   11/10/17: 6.8% yay! Concern for syncopal episodes d/t hypoglycemia. We stopped Glipizide.  Mag level was also quite low.   Today 02/09/18: 6.3%   Hypertension: No chest pain, pressure. No home blood pressures to report. Blood pressure is at goal today 130/80.    Loose stool: Happening daily for the past month or so, loose stool about once or twice per day, usually 20 or 30 minutes after a meal.  No significant abdominal pain, associated with occasional nausea.  No history of previous episodes, no recent travel, no fever/chills or weight loss, no close contacts with similar symptoms.      Past medical history, surgical history, social history and family history reviewed.   Current Meds  Medication Sig  . ACCU-CHEK FASTCLIX LANCETS MISC Check fsbs TID  . aspirin 81 MG tablet Take 81 mg by mouth daily.  . Cholecalciferol (VITAMIN D3) 5000 UNITS TABS Take 5,000 Units by mouth daily.   . diclofenac sodium (VOLTAREN) 1 % GEL Apply 4 g topically 4 (four) times daily. To affected joint.  . Fe Fum-FePoly-FA-Vit C-Vit B3 (FOLIVANE-F) 125-1 MG CAPS Take 1 capsule by mouth daily.  Marland Kitchen FeFum-FePoly-FA-B Cmp-C-Biot (FOLIVANE-PLUS) CAPS Take 1 capsule by mouth daily.  Marland Kitchen ibuprofen (ADVIL,MOTRIN) 800  MG tablet Take 1 tablet (800 mg total) by mouth every 8 (eight) hours as needed for moderate pain.  . Insulin Glargine (LANTUS SOLOSTAR) 100 UNIT/ML Solostar Pen Inject 12-14 Units into the skin daily. Per protocol  . insulin lispro (HUMALOG) 100 UNIT/ML KiwkPen Inject 0.1 mLs (10 Units total) into the skin daily. Prior to largest meal of the day  . Insulin Pen Needle (UNIFINE PENTIPS) 31G X 8 MM MISC USE AS DIRECTED WITH LANTUS AND BYETTA  . Krill Oil 500 MG CAPS Take 500 mg by mouth daily.  Marland Kitchen lisinopril (PRINIVIL,ZESTRIL) 40 MG tablet Take 1 tablet (40 mg total) by mouth daily.  . magnesium oxide (MAG-OX) 400 MG tablet Take 1 tablet (400 mg total) by mouth 2 (two) times daily.  . metFORMIN (GLUCOPHAGE) 1000 MG tablet Take 1 tablet (1,000 mg total) by mouth 2 (two) times daily with a meal.  . rosuvastatin (CRESTOR) 20 MG tablet Take 20 mg by mouth 3 (three) times a week.  Marland Kitchen VICTOZA 18 MG/3ML SOPN INJECT 0 2 MLS  1 2 MG TOTAL  INTO THE SKIN DAILY    Current medication list and allergy/intolerance information reviewed.     Allergies  Allergen Reactions  . Penicillins Anaphylaxis    Happened as a young child  . Atrovent [Ipratropium] Palpitations    As per patient  . Naproxen Rash  . Quinine Derivatives Rash  . Sulfa Antibiotics Rash      Review of Systems:  Constitutional: No  recent illness  HEENT: No  headache, no vision change  Cardiac: No  chest pain, No  pressure, No palpitations, no orthopnea, no LE swelling   Respiratory:  No  shortness of breath. No  Cough  Gastrointestinal: No  abdominal pain, +change on bowel habits  Musculoskeletal: No new myalgia/arthralgia  Skin: No  Rash  Hem/Onc: No  easy bruising/bleeding, No  abnormal lumps/bumps  Neurologic: No  weakness, No  Dizziness, (+)symcopal episodes as above   Psychiatric: No  concerns with depression, No  concerns with anxiety  Exam:  BP 119/62 (BP Location: Left Arm, Patient Position: Sitting, Cuff Size:  Normal)   Pulse 73   Temp 98 F (36.7 C) (Oral)   Wt 151 lb 14.4 oz (68.9 kg)   LMP 12/03/2013   BMI 31.75 kg/m   Constitutional: VS see above. General Appearance: alert, well-developed, well-nourished, NAD  Eyes: Normal lids and conjunctive, non-icteric sclera  Ears, Nose, Mouth, Throat: MMM, Normal external inspection ears/nares/mouth/lips/gums.  Neck: No masses, trachea midline.   Respiratory: Normal respiratory effort. no wheeze, no rhonchi, no rales  Cardiovascular: S1/S2 normal, no murmur, no rub/gallop auscultated. RRR.   Musculoskeletal: Gait normal. Symmetric and independent movement of all extremities  Abd/GI: nontender, nondistended, no organomegaly though habitus somewhat limits exam.  Bowel sounds within normal limits x4.  Rectal exam deferred.  Neurological: Normal balance/coordination. No tremor.  Skin: warm, dry, intact.   Psychiatric: Normal judgment/insight. Normal mood and affect. Oriented x3.     EKG interpretation: Rate: 74 Rhythm: sinus No ST/T changes concerning for acute ischemia/infarct  Previous EKG 12/04/15 looks the same   No data found.     ASSESSMENT/PLAN:   Diabetes mellitus due to underlying condition with hyperosmolarity without coma, without long-term current use of insulin ([place]) - Plan: POCT HgB A1C, CBC with Differential/Platelet, COMPLETE METABOLIC PANEL WITH GFR, TSH, Lipase, Urinalysis, Routine w reflex microscopic  Diarrhea, unspecified type - Plan: CBC with Differential/Platelet, COMPLETE METABOLIC PANEL WITH GFR, TSH, Lipase, Urinalysis, Routine w reflex microscopic, Stool Culture, Stool, WBC/Lactoferrin, Ova and parasite examination    Meds ordered this encounter  Medications  . loperamide (IMODIUM) 2 MG capsule    Sig: Take 1 capsule (2 mg total) by mouth as needed for diarrhea or loose stools (30 minutes prior to meals).    Dispense:  30 capsule    Refill:  0    Patient Instructions  Plan: We will get blood work  today to evaluate for possible liver/pancreas problems or infection causing frequent loose stool.  Will get stool tests if needed.  Will trial Imodium prior to meals.  I suspect more of a functional bowel problem such as irritable bowel.  If symptoms worsen or do not respond to medications, will proceed with further work-up such as imaging and/or specialist referral    Follow-up plan: Return in about 3 months (around 05/12/2018) for A1C recheck, sooner if needed for GI issues if no better .   Visit summary with medication list and pertinent instructions was printed for patient to review, alert [place] if any changes needed. All questions at time of visit were answered - patient instructed to contact office with any additional concerns. ER/RTC precautions were reviewed with the patient and understanding verbalized.   Note: Total time spent 25 minutes, greater than 50% of the visit was spent face-to-face counseling and coordinating care for the following: The primary encounter diagnosis was Diabetes mellitus due to underlying condition with hyperosmolarity without coma, without long-term current use  of insulin ([place]). A diagnosis of Diarrhea, unspecified type was also pertinent to this visit.Marland Kitchen  Please note: voice recognition software was used to produce this document, and typos may escape review. Please contact Dr. Sheppard Coil for any needed clarifications.

## 2018-02-10 LAB — COMPLETE METABOLIC PANEL WITH GFR
AG Ratio: 1.1 (calc) (ref 1.0–2.5)
ALT: 35 U/L — ABNORMAL HIGH (ref 6–29)
AST: 52 U/L — ABNORMAL HIGH (ref 10–35)
Albumin: 3.9 g/dL (ref 3.6–5.1)
Alkaline phosphatase (APISO): 66 U/L (ref 33–130)
BUN: 7 mg/dL (ref 7–25)
CO2: 25 mmol/L (ref 20–32)
Calcium: 9.8 mg/dL (ref 8.6–10.4)
Chloride: 102 mmol/L (ref 98–110)
Creat: 0.67 mg/dL (ref 0.50–1.05)
GFR, Est African American: 115 mL/min/{1.73_m2} (ref >=60)
GFR, Est Non African American: 99 mL/min/{1.73_m2} (ref >=60)
Globulin: 3.5 g/dL (calc) (ref 1.9–3.7)
Glucose, Bld: 92 mg/dL (ref 65–99)
Potassium: 4.8 mmol/L (ref 3.5–5.3)
Sodium: 138 mmol/L (ref 135–146)
Total Bilirubin: 1.2 mg/dL (ref 0.2–1.2)
Total Protein: 7.4 g/dL (ref 6.1–8.1)

## 2018-02-10 LAB — CBC WITH DIFFERENTIAL/PLATELET
Basophils Absolute: 62 cells/uL (ref 0–200)
Basophils Relative: 1.1 %
Eosinophils Absolute: 370 cells/uL (ref 15–500)
Eosinophils Relative: 6.6 %
HCT: 34.5 % — ABNORMAL LOW (ref 35.0–45.0)
Hemoglobin: 11.9 g/dL (ref 11.7–15.5)
Lymphs Abs: 1630 cells/uL (ref 850–3900)
MCH: 29.2 pg (ref 27.0–33.0)
MCHC: 34.5 g/dL (ref 32.0–36.0)
MCV: 84.8 fL (ref 80.0–100.0)
MPV: 11.1 fL (ref 7.5–12.5)
Monocytes Relative: 7.4 %
Neutro Abs: 3125 cells/uL (ref 1500–7800)
Neutrophils Relative %: 55.8 %
Platelets: 111 10*3/uL — ABNORMAL LOW (ref 140–400)
RBC: 4.07 10*6/uL (ref 3.80–5.10)
RDW: 13.4 % (ref 11.0–15.0)
Total Lymphocyte: 29.1 %
WBC mixed population: 414 cells/uL (ref 200–950)
WBC: 5.6 10*3/uL (ref 3.8–10.8)

## 2018-02-10 LAB — URINALYSIS, ROUTINE W REFLEX MICROSCOPIC
Bacteria, UA: NONE SEEN /HPF
Bilirubin Urine: NEGATIVE
Glucose, UA: NEGATIVE
Hgb urine dipstick: NEGATIVE
Hyaline Cast: NONE SEEN /LPF
Ketones, ur: NEGATIVE
Leukocytes, UA: NEGATIVE
Nitrite: NEGATIVE
Specific Gravity, Urine: 1.013 (ref 1.001–1.03)
Squamous Epithelial / LPF: NONE SEEN /HPF (ref <=5)
WBC, UA: NONE SEEN /HPF (ref 0–5)
pH: 6.5 (ref 5.0–8.0)

## 2018-02-10 LAB — TSH: TSH: 1.71 mIU/L

## 2018-02-10 LAB — LIPASE: Lipase: 49 U/L (ref 7–60)

## 2018-02-14 MED FILL — LANTUS SOLOSTAR 100 UNITS/M: 100 | 85 days supply | Qty: 12 | Fill #1

## 2018-02-14 MED FILL — ULTICARE PEN NDL 8MM 31G: 31G X 8 MM | 30 days supply | Qty: 100 | Fill #4

## 2018-02-26 ENCOUNTER — Other Ambulatory Visit: Payer: Self-pay

## 2018-02-26 DIAGNOSIS — E0800 Diabetes mellitus due to underlying condition with hyperosmolarity without nonketotic hyperglycemic-hyperosmolar coma (NKHHC): Secondary | ICD-10-CM

## 2018-02-26 MED ORDER — GLUCOSE BLOOD VI STRP: ORAL_STRIP | 12 refills | Status: DC

## 2018-02-26 MED FILL — ACCU-CHEK GUIDE STRP: 34 days supply | Qty: 100 | Fill #0

## 2018-02-26 MED FILL — VICTOZA 2-PAK 18 MG/3 ML PE: 18 | 30 days supply | Qty: 6 | Fill #3

## 2018-02-26 MED FILL — LISINOPRIL 40 MG TABLET: 40 | 90 days supply | Qty: 90 | Fill #1

## 2018-03-21 MED FILL — ULTICARE PEN NDL 8MM 31G: 31G X 8 MM | 30 days supply | Qty: 100 | Fill #5

## 2018-03-21 MED FILL — metFORMIN HCL 1000 MG TABS: 1000 | 45 days supply | Qty: 90 | Fill #2

## 2018-04-13 MED FILL — VICTOZA 2-PAK 18 MG/3 ML PE: 18 | 30 days supply | Qty: 6 | Fill #4

## 2018-04-27 MED FILL — ROSUVASTATIN CALCIUM 20 MG: 20 | 90 days supply | Qty: 90 | Fill #0

## 2018-04-27 MED FILL — ULTICARE PEN NDL 8MM 31G: 31G X 8 MM | 30 days supply | Qty: 100 | Fill #6

## 2018-04-30 MED FILL — HUMALOG 100 UNITS/ML KWIKPE: 100 | 90 days supply | Qty: 9 | Fill #2

## 2018-05-04 MED FILL — metFORMIN HCL 1000 MG TABS: 1000 | 45 days supply | Qty: 90 | Fill #3

## 2018-05-14 MED FILL — VICTOZA 2-PAK 18 MG/3 ML PE: 18 | 30 days supply | Qty: 6 | Fill #5

## 2018-05-15 ENCOUNTER — Ambulatory Visit (INDEPENDENT_AMBULATORY_CARE_PROVIDER_SITE_OTHER): Payer: No Typology Code available for payment source | Admitting: Osteopathic Medicine

## 2018-05-15 ENCOUNTER — Encounter: Payer: Self-pay | Admitting: Osteopathic Medicine

## 2018-05-15 VITALS — BP 117/52 | HR 79 | Temp 98.3°F | Wt 150.7 lb

## 2018-05-15 DIAGNOSIS — R21 Rash and other nonspecific skin eruption: Secondary | ICD-10-CM | POA: Diagnosis not present

## 2018-05-15 DIAGNOSIS — E119 Type 2 diabetes mellitus without complications: Secondary | ICD-10-CM | POA: Diagnosis not present

## 2018-05-15 DIAGNOSIS — Z794 Long term (current) use of insulin: Secondary | ICD-10-CM | POA: Diagnosis not present

## 2018-05-15 LAB — POCT GLYCOSYLATED HEMOGLOBIN (HGB A1C): Hemoglobin A1C: 6.5 % — AB (ref 4.0–5.6)

## 2018-05-15 MED ORDER — FLUOCINOLONE ACETONIDE 0.01 % EX CREA: TOPICAL_CREAM | Freq: Two times a day (BID) | CUTANEOUS | 1 refills | Status: DC

## 2018-05-15 MED ORDER — SEMAGLUTIDE(0.25 OR 0.5MG/DOS) 2 MG/1.5ML ~~LOC~~ SOPN: 0.5000 mg | PEN_INJECTOR | SUBCUTANEOUS | 5 refills | Status: DC

## 2018-05-15 MED FILL — FLUOCINOLONE 0.01% CREAM: 0.01 | 14 days supply | Qty: 60 | Fill #0

## 2018-05-15 NOTE — Progress Notes (Signed)
HPI: Meagan Peck is a 58 y.o. female who  has a past medical history of Diabetes mellitus without complication ([place]), GERD (gastroesophageal reflux disease), and Hypertension.  she presents to [facility] today, 05/15/18,  for chief complaint of:  Follow-up Diabetes  New problem: Rash  Diabetes: Medications now:   Humalog 10 Units daily w/ largest meal   Lantus  Units daily   Victoza 1.2 mg daily   Metformin 1000 mg bid  A1C records  at [facility], 02/13/2017. Foot Exam was performed at that visit. A1c at that point was 8.0%.   A1c was looking a little bit better here 05/15/17 at 7.6%.  08/11/17: 7.1% yay! No changes made to meds   11/10/17: 6.8% yay! Concern for syncopal episodes d/t hypoglycemia. We stopped Glipizide.  Mag level was also quite low.   02/09/18: 6.3%  Today, 05/15/18:  6.5% and pt is doing well no hypogyucemia    Rash: 2 weeks rash on arms, no new known exposures. Reports itching. Mild rash, more like dry skin      Past medical history, surgical history, social history and family history reviewed.   Current Meds  Medication Sig  . ACCU-CHEK FASTCLIX LANCETS MISC Check fsbs TID  . aspirin 81 MG tablet Take 81 mg by mouth daily.  . Cholecalciferol (VITAMIN D3) 5000 UNITS TABS Take 5,000 Units by mouth daily.   . diclofenac sodium (VOLTAREN) 1 % GEL Apply 4 g topically 4 (four) times daily. To affected joint.  . Fe Fum-FePoly-FA-Vit C-Vit B3 (FOLIVANE-F) 125-1 MG CAPS Take 1 capsule by mouth daily.  Marland Kitchen FeFum-FePoly-FA-B Cmp-C-Biot (FOLIVANE-PLUS) CAPS Take 1 capsule by mouth daily.  Marland Kitchen glucose blood (ACCU-CHEK GUIDE) test strip Use to check blood sugar 3 times daily. Dx:E08.00  . ibuprofen (ADVIL,MOTRIN) 800 MG tablet Take 1 tablet (800 mg total) by mouth every 8 (eight) hours as needed for moderate pain.  . Insulin Glargine (LANTUS SOLOSTAR) 100 UNIT/ML Solostar Pen Inject 12-14 Units into the skin daily. Per  protocol  . insulin lispro (HUMALOG) 100 UNIT/ML KiwkPen Inject 0.1 mLs (10 Units total) into the skin daily. Prior to largest meal of the day  . Insulin Pen Needle (UNIFINE PENTIPS) 31G X 8 MM MISC USE AS DIRECTED WITH LANTUS AND BYETTA  . Krill Oil 500 MG CAPS Take 500 mg by mouth daily.  Marland Kitchen lisinopril (PRINIVIL,ZESTRIL) 40 MG tablet Take 1 tablet (40 mg total) by mouth daily.  . metFORMIN (GLUCOPHAGE) 1000 MG tablet Take 1 tablet (1,000 mg total) by mouth 2 (two) times daily with a meal.  . rosuvastatin (CRESTOR) 20 MG tablet Take 20 mg by mouth 3 (three) times a week.  Marland Kitchen VICTOZA 18 MG/3ML SOPN INJECT 0 2 MLS  1 2 MG TOTAL  INTO THE SKIN DAILY    Current medication list and allergy/intolerance information reviewed.     Allergies  Allergen Reactions  . Penicillins Anaphylaxis    Happened as a young child  . Atrovent [Ipratropium] Palpitations    As per patient  . Naproxen Rash  . Quinine Derivatives Rash  . Sulfa Antibiotics Rash      Review of Systems:  Constitutional: No recent illness  HEENT: No  headache, no vision change  Cardiac: No  chest pain, No  pressure, No palpitations, no orthopnea, no LE swelling   Respiratory:  No  shortness of breath. No  Cough  Musculoskeletal: No new myalgia/arthralgia  Skin: +Rash  Psychiatric: No  concerns  with depression, No  concerns with anxiety  Exam:  BP (!) 117/52 (BP Location: Left Arm, Patient Position: Sitting, Cuff Size: Normal)   Pulse 79   Temp 98.3 F (36.8 C) (Oral)   Wt 150 lb 11.2 oz (68.4 kg)   LMP 12/03/2013   BMI 31.50 kg/m   Constitutional: VS see above. General Appearance: alert, well-developed, well-nourished, NAD  Eyes: Normal lids and conjunctive, non-icteric sclera  Ears, Nose, Mouth, Throat: MMM, Normal external inspection ears/nares/mouth/lips/gums.  Neck: No masses, trachea midline.   Respiratory: Normal respiratory effort. no wheeze, no rhonchi, no rales  Cardiovascular: S1/S2 normal, no  murmur, no rub/gallop auscultated. RRR.   Musculoskeletal: Gait normal. Symmetric and independent movement of all extremities  Neurological: Normal balance/coordination. No tremor.  Skin: warm, dry, intact. Dry skin on forearms, no significant rash, mild excoriation   Psychiatric: Normal judgment/insight. Normal mood and affect. Oriented x3.         ASSESSMENT/PLAN:   Type 2 diabetes mellitus without complication, with long-term current use of insulin ([facility]) - Plan: POCT HgB A1C, COMPLETE METABOLIC PANEL WITH GFR, Lipid panel, Hemoglobin A1c, CBC, Magnesium  Rash and other nonspecific skin eruption    Meds ordered this encounter  Medications  . Semaglutide,0.25 or 0.5MG /DOS, (OZEMPIC, 0.25 OR 0.5 MG/DOSE,) 2 MG/1.5ML SOPN    Sig: Inject 0.5 mg into the skin once a week.    Dispense:  4 pen    Refill:  5    Please run with savings card, can cancel victoza if/when this gets covered  . fluocinolone (VANOS) 0.01 % cream    Sig: Apply topically 2 (two) times daily. To affected area(s) as needed for no more than 2 weeks    Dispense:  60 g    Refill:  1    Patient Instructions  Plan:   Will work on switching from [employer] daily injection to [employer] once weekly injection   For arms, mild steroid Rx sent to pharmacy  Please also try eczema-formulated lotions (CeraVe, Aveeno, Cetaphil are good ones)     Follow-up plan: Return in about 3 months (around 08/14/2018) for ANNUAL PHYSICAL - fasting labs at least 2 days prior to visit , orders are in .   Visit summary with medication list and pertinent instructions was printed for patient to review, alert [place] if any changes needed. All questions at time of visit were answered - patient instructed to contact office with any additional concerns. ER/RTC precautions were reviewed with the patient and understanding verbalized.   Note: Total time spent 25 minutes, greater than 50% of the visit was spent face-to-face counseling and  coordinating care for the following: The primary encounter diagnosis was Type 2 diabetes mellitus without complication, with long-term current use of insulin ([place]). A diagnosis of Rash and other nonspecific skin eruption was also pertinent to this visit.Marland Kitchen  Please note: voice recognition software was used to produce this document, and typos may escape review. Please contact Dr. Sheppard Coil for any needed clarifications.

## 2018-05-15 NOTE — Patient Instructions (Signed)
Plan:   Will work on switching from [employer] daily injection to [employer] once weekly injection   For arms, mild steroid Rx sent to pharmacy  Please also try eczema-formulated lotions (CeraVe, Aveeno, Cetaphil are good ones)

## 2018-05-18 ENCOUNTER — Telehealth: Payer: Self-pay | Admitting: Osteopathic Medicine

## 2018-05-18 MED FILL — OZEMPIC 0.25 OR 0.5 MG/DOSE: 2 | 28 days supply | Qty: 2 | Fill #0

## 2018-05-18 NOTE — Telephone Encounter (Signed)
Received fax from [place] that [place] was approved from 05/17/2018.  Pharmacy notified and form sent to scan.   Reference ID: [id number]

## 2018-05-29 ENCOUNTER — Other Ambulatory Visit: Payer: Self-pay | Admitting: Family Medicine

## 2018-05-29 MED FILL — LANTUS SOLOSTAR 100 UNITS/M: 100 | 85 days supply | Qty: 12 | Fill #2

## 2018-05-29 MED FILL — ULTICARE PEN NDL 8MM 31G: 31G X 8 MM | 30 days supply | Qty: 100 | Fill #7

## 2018-05-29 MED FILL — LISINOPRIL 40 MG TABLET: 40 | 90 days supply | Qty: 90 | Fill #0

## 2018-06-05 ENCOUNTER — Telehealth: Payer: No Typology Code available for payment source | Admitting: Nurse Practitioner

## 2018-06-05 DIAGNOSIS — J0100 Acute maxillary sinusitis, unspecified: Secondary | ICD-10-CM | POA: Diagnosis not present

## 2018-06-05 MED ORDER — DOXYCYCLINE HYCLATE 100 MG PO TABS: 100.0000 mg | ORAL_TABLET | Freq: Two times a day (BID) | ORAL | 0 refills | Status: DC

## 2018-06-05 MED FILL — DOXYCYCLINE HYCLATE 100 MG: 100 | 10 days supply | Qty: 20 | Fill #0

## 2018-06-05 NOTE — Progress Notes (Signed)
We are sorry that you are not feeling well.  Here is how we plan to help!  5-10 minutes spent on reading chart and documentation  Based on what you have shared with me it looks like you have sinusitis.  Sinusitis is inflammation and infection in the sinus cavities of the head.  Based on your presentation I believe you most likely have Acute Bacterial Sinusitis.  This is an infection caused by bacteria and is treated with antibiotics. I have prescribed Doxycycline 100mg  by mouth twice a day for 10 days. You may use an oral decongestant such as Mucinex D or if you have glaucoma or high blood pressure use plain Mucinex. Saline nasal spray help and can safely be used as often as needed for congestion.  If you develop worsening sinus pain, fever or notice severe headache and vision changes, or if symptoms are not better after completion of antibiotic, please schedule an appointment with a health care provider.    Sinus infections are not as easily transmitted as other respiratory infection, however we still recommend that you avoid close contact with loved ones, especially the very young and elderly.  Remember to wash your hands thoroughly throughout the day as this is the number one way to prevent the spread of infection!  Home Care:  Only take medications as instructed by your medical team.  Complete the entire course of an antibiotic.  Do not take these medications with alcohol.  A steam or ultrasonic humidifier can help congestion.  You can place a towel over your head and breathe in the steam from hot water coming from a faucet.  Avoid close contacts especially the very young and the elderly.  Cover your mouth when you cough or sneeze.  Always remember to wash your hands.  Get Help Right Away If:  You develop worsening fever or sinus pain.  You develop a severe head ache or visual changes.  Your symptoms persist after you have completed your treatment plan.  Make sure you  Understand  these instructions.  Will watch your condition.  Will get help right away if you are not doing well or get worse.  Your e-visit answers were reviewed by a board certified advanced clinical practitioner to complete your personal care plan.  Depending on the condition, your plan could have included both over the counter or prescription medications.  If there is a problem please reply  once you have received a response from your provider.  Your safety is important to [place].  If you have drug allergies check your prescription carefully.    You can use MyChart to ask questions about today's visit, request a non-urgent call back, or ask for a work or school excuse for 24 hours related to this e-Visit. If it has been greater than 24 hours you will need to follow up with your provider, or enter a new e-Visit to address those concerns.  You will get an e-mail in the next two days asking about your experience.  I hope that your e-visit has been valuable and will speed your recovery. Thank you for using e-visits.

## 2018-06-19 ENCOUNTER — Other Ambulatory Visit: Payer: Self-pay

## 2018-06-19 ENCOUNTER — Other Ambulatory Visit: Payer: Self-pay | Admitting: Osteopathic Medicine

## 2018-06-19 MED ORDER — METFORMIN HCL 1000 MG PO TABS: 1000.0000 mg | ORAL_TABLET | Freq: Two times a day (BID) | ORAL | 3 refills | Status: DC

## 2018-06-19 MED FILL — metFORMIN HCL 1000 MG TABS: 1000 | 45 days supply | Qty: 90 | Fill #0

## 2018-06-19 NOTE — Telephone Encounter (Signed)
Requested medication (s) are due for refill today: yes  Requested medication (s) are on the active medication list: yes  Last refill:  12/15/17 #90 3 RF  Future visit scheduled: yes  Notes to clinic:  Pt of [name] DO   Requested Prescriptions  Pending Prescriptions Disp Refills   metFORMIN (GLUCOPHAGE) 1000 MG tablet [Pharmacy Med Name: metFORMIN HCL 1000 MG TABS 1000 TAB] 90 tablet 3    Sig: TAKE 1 TABLET (1,000 MG TOTAL) BY MOUTH 2 TIMES DAILY WITH A MEAL.     Endocrinology:  Diabetes - Biguanides Failed - 06/19/2018  7:36 AM      Failed - Valid encounter within last 6 months    Recent Outpatient Visits          1 month ago Type 2 diabetes mellitus without complication, with long-term current use of insulin ([place])   [facility], Lanelle Bal, DO   4 months ago Diabetes mellitus due to underlying condition with hyperosmolarity without coma, without long-term current use of insulin [facility])   [facility], Lanelle Bal, DO   7 months ago Low magnesium level   [facility], Rebekah Chesterfield, MD   7 months ago Type 2 diabetes mellitus without complication, with long-term current use of insulin [facility])   [facility], Lanelle Bal, DO   8 months ago Acute pain of left knee   [facility], Rebekah Chesterfield, MD             Passed - Cr in normal range and within 360 days    Creat  Date Value Ref Range Status  02/09/2018 0.67 0.50 - 1.05 mg/dL Final    Comment:    For patients >26 years of age, the reference limit for Creatinine is approximately 13% higher for people identified as African-American. .          Passed - HBA1C is between 0 and 7.9 and within 180 days    Hemoglobin A1C  Date Value Ref Range Status  05/15/2018 6.5 (A) 4.0 - 5.6 % Final   Hgb A1c MFr Bld  Date Value Ref Range  Status  12/04/2015 9.2 (H) 4.8 - 5.6 % Final    Comment:    (NOTE)         Pre-diabetes: 5.7 - 6.4         Diabetes: >6.4         Glycemic control for adults with diabetes: <7.0          Passed - eGFR in normal range and within 360 days    GFR, Est African American  Date Value Ref Range Status  02/09/2018 115 > OR = 60 mL/min/1.29m Final   GFR, Est Non African American  Date Value Ref Range Status  02/09/2018 99 > OR = 60 mL/min/1.714mFinal

## 2018-07-05 MED FILL — OZEMPIC 0.25 OR 0.5 MG/DOSE: 2 | 28 days supply | Qty: 2 | Fill #1

## 2018-07-05 MED FILL — ACCU-CHEK GUIDE STRP: 34 days supply | Qty: 100 | Fill #1

## 2018-07-19 MED FILL — HUMALOG 100 UNITS/ML KWIKPE: 100 | 90 days supply | Qty: 9 | Fill #3

## 2018-07-19 MED FILL — ULTICARE PEN NDL 8MM 31G: 31G X 8 MM | 30 days supply | Qty: 100 | Fill #8

## 2018-07-24 MED FILL — metFORMIN HCL 1000 MG TABS: 1000 | 45 days supply | Qty: 90 | Fill #1

## 2018-07-30 MED FILL — OZEMPIC 0.25 OR 0.5 MG/DOSE: 2 | 28 days supply | Qty: 2 | Fill #2

## 2018-08-14 ENCOUNTER — Encounter: Payer: No Typology Code available for payment source | Admitting: Osteopathic Medicine

## 2018-08-22 MED FILL — LISINOPRIL 40 MG TABLET: 40 | 90 days supply | Qty: 90 | Fill #1

## 2018-09-07 MED FILL — OZEMPIC 0.25 OR 0.5 MG/DOSE: 2 | 28 days supply | Qty: 2 | Fill #3

## 2018-09-07 MED FILL — LANTUS SOLOSTAR 100 UNITS/M: 100 | 85 days supply | Qty: 12 | Fill #3

## 2018-09-07 MED FILL — ULTICARE PEN NDL 8MM 31G: 31G X 8 MM | 30 days supply | Qty: 100 | Fill #9

## 2018-09-17 MED FILL — metFORMIN HCL 1000 MG TABS: 1000 | 45 days supply | Qty: 90 | Fill #2

## 2018-09-22 LAB — LIPID PANEL
Cholesterol: 151 mg/dL (ref <200)
HDL: 53 mg/dL (ref >=50)
LDL Cholesterol (Calc): 84 mg/dL (calc)
Non-HDL Cholesterol (Calc): 98 mg/dL (calc) (ref <130)
Total CHOL/HDL Ratio: 2.8 (calc) (ref <5.0)
Triglycerides: 56 mg/dL (ref <150)

## 2018-09-22 LAB — COMPLETE METABOLIC PANEL WITH GFR
AG Ratio: 1.1 (calc) (ref 1.0–2.5)
ALT: 28 U/L (ref 6–29)
AST: 41 U/L — ABNORMAL HIGH (ref 10–35)
Albumin: 3.6 g/dL (ref 3.6–5.1)
Alkaline phosphatase (APISO): 69 U/L (ref 37–153)
BUN: 8 mg/dL (ref 7–25)
CO2: 25 mmol/L (ref 20–32)
Calcium: 9.3 mg/dL (ref 8.6–10.4)
Chloride: 104 mmol/L (ref 98–110)
Creat: 0.64 mg/dL (ref 0.50–1.05)
GFR, Est African American: 116 mL/min/{1.73_m2} (ref >=60)
GFR, Est Non African American: 100 mL/min/{1.73_m2} (ref >=60)
Globulin: 3.2 g/dL (calc) (ref 1.9–3.7)
Glucose, Bld: 122 mg/dL — ABNORMAL HIGH (ref 65–99)
Potassium: 4.6 mmol/L (ref 3.5–5.3)
Sodium: 136 mmol/L (ref 135–146)
Total Bilirubin: 1.1 mg/dL (ref 0.2–1.2)
Total Protein: 6.8 g/dL (ref 6.1–8.1)

## 2018-09-22 LAB — CBC
HCT: 31.4 % — ABNORMAL LOW (ref 35.0–45.0)
Hemoglobin: 10.6 g/dL — ABNORMAL LOW (ref 11.7–15.5)
MCH: 27.4 pg (ref 27.0–33.0)
MCHC: 33.8 g/dL (ref 32.0–36.0)
MCV: 81.1 fL (ref 80.0–100.0)
MPV: 12.1 fL (ref 7.5–12.5)
Platelets: 95 10*3/uL — ABNORMAL LOW (ref 140–400)
RBC: 3.87 10*6/uL (ref 3.80–5.10)
RDW: 15.1 % — ABNORMAL HIGH (ref 11.0–15.0)
WBC: 3.9 10*3/uL (ref 3.8–10.8)

## 2018-09-22 LAB — HEMOGLOBIN A1C
Hgb A1c MFr Bld: 6.1 % of total Hgb — ABNORMAL HIGH (ref <5.7)
Mean Plasma Glucose: 128 (calc)
eAG (mmol/L): 7.1 (calc)

## 2018-09-22 LAB — MAGNESIUM: Magnesium: 1.1 mg/dL — ABNORMAL LOW (ref 1.5–2.5)

## 2018-09-25 ENCOUNTER — Encounter: Payer: Self-pay | Admitting: Osteopathic Medicine

## 2018-09-25 ENCOUNTER — Ambulatory Visit (INDEPENDENT_AMBULATORY_CARE_PROVIDER_SITE_OTHER): Payer: No Typology Code available for payment source | Admitting: Osteopathic Medicine

## 2018-09-25 VITALS — BP 129/59 | HR 72 | Temp 98.0°F | Wt 152.9 lb

## 2018-09-25 DIAGNOSIS — D696 Thrombocytopenia, unspecified: Secondary | ICD-10-CM

## 2018-09-25 DIAGNOSIS — F321 Major depressive disorder, single episode, moderate: Secondary | ICD-10-CM

## 2018-09-25 DIAGNOSIS — Z1211 Encounter for screening for malignant neoplasm of colon: Secondary | ICD-10-CM

## 2018-09-25 DIAGNOSIS — Z0000 Encounter for general adult medical examination without abnormal findings: Secondary | ICD-10-CM | POA: Diagnosis not present

## 2018-09-25 DIAGNOSIS — Z23 Encounter for immunization: Secondary | ICD-10-CM

## 2018-09-25 DIAGNOSIS — Z5320 Procedure and treatment not carried out because of patient's decision for unspecified reasons: Secondary | ICD-10-CM

## 2018-09-25 DIAGNOSIS — D649 Anemia, unspecified: Secondary | ICD-10-CM

## 2018-09-25 DIAGNOSIS — Z79899 Other long term (current) drug therapy: Secondary | ICD-10-CM

## 2018-09-25 DIAGNOSIS — D509 Iron deficiency anemia, unspecified: Secondary | ICD-10-CM

## 2018-09-25 DIAGNOSIS — E119 Type 2 diabetes mellitus without complications: Secondary | ICD-10-CM

## 2018-09-25 DIAGNOSIS — Z794 Long term (current) use of insulin: Secondary | ICD-10-CM

## 2018-09-25 MED ORDER — ROSUVASTATIN CALCIUM 20 MG PO TABS: 20.0000 mg | ORAL_TABLET | ORAL | 1 refills | Status: DC

## 2018-09-25 MED ORDER — ESCITALOPRAM OXALATE 5 MG PO TABS: 5.0000 mg | ORAL_TABLET | Freq: Every evening | ORAL | 0 refills | Status: DC

## 2018-09-25 MED ORDER — MAGNESIUM OXIDE 400 MG PO TABS: 400.0000 mg | ORAL_TABLET | Freq: Two times a day (BID) | ORAL | 0 refills | Status: DC

## 2018-09-25 MED FILL — MAGNESIUM OXIDE 400 MG TAB: 400 (240 MG | 60 days supply | Qty: 120 | Fill #0

## 2018-09-25 MED FILL — ESCITALOPRAM 5 MG TABLET: 5 | 90 days supply | Qty: 90 | Fill #0

## 2018-09-25 NOTE — Progress Notes (Signed)
HPI: Meagan Peck is a 58 y.o. female who  has a past medical history of Diabetes mellitus without complication ([place]), GERD (gastroesophageal reflux disease), and Hypertension.  she presents to [facility] today, 09/25/18,  for chief complaint of: Annual Physical  (+)depression screening     Patient here for annual physical / wellness exam.  See preventive care reviewed as below.  Recent labs reviewed in detail with the patient.   Anemia is a bit new  Liver enzymes are improved  Platelets are low  A1c is at goal  LDL is really close to goal, she is taking Crestor 3 times a week  Additional concerns today include:   Depression issues lately, mood has been low, trouble sleeping.  Her husband suffered a traumatic brain injury at work, he has been in rehab and she has been unable to visit him due to coronavirus pandemic.     Past medical, surgical, social and family history reviewed:  Patient Active Problem List   Diagnosis Date Noted  . Syncope 11/27/2017  . Chest pain 11/27/2017  . Vitamin D deficiency 05/15/2017  . Iron deficiency 05/15/2017  . Breast screening declined 05/15/2017  . Colonoscopy refused 05/15/2017  . Chronic right shoulder pain 05/15/2017  . Choroidal nevus of both eyes 07/18/2016  . Corneal epithelial and basement membrane dystrophy 07/18/2016  . Thrombocytopenia ([place]) 04/14/2016  . Acute medial meniscus tear of right knee 12/10/2015  . Benign essential HTN 05/25/2015  . Dyslipidemia 05/25/2015  . Gastroesophageal reflux disease 05/25/2015  . Type 2 diabetes mellitus without complication, with long-term current use of insulin ([place]) 05/25/2015  . Diabetes ([place]) 07/23/2012    Past Surgical History:  Procedure Laterality Date  . CARPAL TUNNEL RELEASE     right hand  . CESAREAN SECTION    . CHOLECYSTECTOMY    . KNEE ARTHROSCOPY Right 12/10/2015   Procedure: RIGHT KNEE ARTHROSCOPY WITH PARTIAL MEDIAL  MENISCECTOMY;  Surgeon: Mcarthur Rossetti, MD;  Location: [facility]S;  Service: Orthopedics;  Laterality: Right;  . KNEE ARTHROSCOPY W/ MENISCAL REPAIR     left knee    Social History   Tobacco Use  . Smoking status: Never Smoker  . Smokeless tobacco: Never Used  Substance Use Topics  . Alcohol use: No    Family History  Problem Relation Age of Onset  . Hypertension Mother   . COPD Mother   . Alcoholism Father   . Heart attack Father   . Diabetes Brother   . Diabetes Paternal Grandmother   . Alcoholism Paternal Uncle   . Alcoholism Maternal Uncle      Current medication list and allergy/intolerance information reviewed:    Current Outpatient Medications  Medication Sig Dispense Refill  . ACCU-CHEK FASTCLIX LANCETS MISC Check fsbs TID    . aspirin 81 MG tablet Take 81 mg by mouth daily.    . Cholecalciferol (VITAMIN D3) 5000 UNITS TABS Take 5,000 Units by mouth daily.     . diclofenac sodium (VOLTAREN) 1 % GEL Apply 4 g topically 4 (four) times daily. To affected joint. 100 g 11  . Fe Fum-FePoly-FA-Vit C-Vit B3 (FOLIVANE-F) 125-1 MG CAPS Take 1 capsule by mouth daily. 90 capsule 3  . FeFum-FePoly-FA-B Cmp-C-Biot (FOLIVANE-PLUS) CAPS Take 1 capsule by mouth daily.  5  . glucose blood (ACCU-CHEK GUIDE) test strip Use to check blood sugar 3 times daily. Dx:E08.00 100 each 12  . ibuprofen (ADVIL,MOTRIN) 800 MG tablet Take 1 tablet (800 mg  total) by mouth every 8 (eight) hours as needed for moderate pain. 60 tablet 0  . Insulin Glargine (LANTUS SOLOSTAR) 100 UNIT/ML Solostar Pen Inject 12-14 Units into the skin daily. Per protocol 15 mL 3  . insulin lispro (HUMALOG) 100 UNIT/ML KiwkPen Inject 0.1 mLs (10 Units total) into the skin daily. Prior to largest meal of the day 15 mL 5  . Insulin Pen Needle (UNIFINE PENTIPS) 31G X 8 MM MISC USE AS DIRECTED WITH LANTUS AND BYETTA 100 each 12  . Krill Oil 500 MG CAPS Take 500 mg by mouth daily.    Marland Kitchen lisinopril (PRINIVIL,ZESTRIL) 40 MG  tablet TAKE 1 TABLET (40 MG TOTAL) BY MOUTH DAILY. 90 tablet 1  . loperamide (IMODIUM) 2 MG capsule Take 1 capsule (2 mg total) by mouth as needed for diarrhea or loose stools (30 minutes prior to meals). 30 capsule 0  . metFORMIN (GLUCOPHAGE) 1000 MG tablet Take 1 tablet (1,000 mg total) by mouth 2 (two) times daily with a meal. 90 tablet 3  . rosuvastatin (CRESTOR) 20 MG tablet Take 1 tablet (20 mg total) by mouth 4 (four) times a week. 90 tablet 1  . Semaglutide,0.25 or 0.5MG /DOS, (OZEMPIC, 0.25 OR 0.5 MG/DOSE,) 2 MG/1.5ML SOPN Inject 0.5 mg into the skin once a week. 4 pen 5  . escitalopram (LEXAPRO) 5 MG tablet Take 1 tablet (5 mg total) by mouth every evening. 90 tablet 0  . magnesium oxide (MAG-OX) 400 MG tablet Take 1 tablet (400 mg total) by mouth 2 (two) times daily. 60 tablet 0   No current facility-administered medications for this visit.     Allergies  Allergen Reactions  . Penicillins Anaphylaxis    Happened as a young child  . Atrovent [Ipratropium] Palpitations    As per patient  . Naproxen Rash  . Quinine Derivatives Rash  . Sulfa Antibiotics Rash      Review of Systems:  Constitutional:  No  fever, no chills, No recent illness, No unintentional weight changes. No significant fatigue.   HEENT: No  headache, no vision change, no hearing change, No sore throat, No  sinus pressure  Cardiac: No  chest pain, No  pressure, No palpitations, No  Orthopnea  Respiratory:  No  shortness of breath. No  Cough  Gastrointestinal: No  abdominal pain, No  nausea, No  vomiting,  No  blood in stool, No  diarrhea, No  constipation   Musculoskeletal: +new myalgia/arthralgia - foot pain, chronic   Skin: No  Rash, No other wounds/concerning lesions  Genitourinary: No  incontinence, No  abnormal genital bleeding, No abnormal genital discharge  Hem/Onc: No  easy bruising/bleeding, No  abnormal lymph node  Endocrine: No cold intolerance,  No heat intolerance. No  polyuria/polydipsia/polyphagia   Neurologic: No  weakness, No  dizziness, No  slurred speech/focal weakness/facial droop  Psychiatric: +concerns with depression, No  concerns with anxiety, +sleep problems, No mood problems  Exam:  BP (!) 129/59 (BP Location: Left Arm, Patient Position: Sitting, Cuff Size: Normal)   Pulse 72   Temp 98 F (36.7 C) (Oral)   Wt 152 lb 14.4 oz (69.4 kg)   LMP 12/03/2013   BMI 31.96 kg/m   Constitutional: VS see above. General Appearance: alert, well-developed, well-nourished, NAD  Eyes: Normal lids and conjunctive, non-icteric sclera  Ears, Nose, Mouth, Throat: MMM, Normal external inspection ears/nares/mouth/lips/gums. TM normal bilaterally. Pharynx/tonsils no erythema, no exudate. Nasal mucosa normal.   Neck: No masses, trachea midline. No thyroid enlargement. No  tenderness/mass appreciated. No lymphadenopathy  Respiratory: Normal respiratory effort. no wheeze, no rhonchi, no rales  Cardiovascular: S1/S2 normal, no murmur, no rub/gallop auscultated. RRR. No lower extremity edema.   Gastrointestinal: Nontender, no masses. No hepatomegaly, no splenomegaly. No hernia appreciated. Bowel sounds normal. Rectal exam deferred.   Musculoskeletal: Gait normal. No clubbing/cyanosis of digits.   Neurological: Normal balance/coordination. No tremor. No cranial nerve deficit on limited exam. Motor and sensation intact and symmetric. Cerebellar reflexes intact.   Skin: warm, dry, intact. No rash/ulcer. No concerning nevi or subq nodules on limited exam.    Psychiatric: Normal judgment/insight. Normal mood and affect. Oriented x3.       ASSESSMENT/PLAN: The primary encounter diagnosis was Annual physical exam. Diagnoses of Colon cancer screening, Need for shingles vaccine, Anemia, unspecified type, Thrombocytopenia ([place]), Breast screening declined, Pap smear of cervix declined, Depression, major, single episode, moderate ([place]), Hypomagnesemia, Type 2 diabetes  mellitus without complication, with long-term current use of insulin ([place]), and High risk medications (not anticoagulants) long-term use were also pertinent to this visit.   Orders Placed This Encounter  Procedures  . Varicella-zoster vaccine IM (Shingrix)  . Cologuard  . Fe+TIBC+Fer  . CBC (INCLUDES DIFF/PLT) WITH PATHOLOGIST REVIEW    Meds ordered this encounter  Medications  . escitalopram (LEXAPRO) 5 MG tablet    Sig: Take 1 tablet (5 mg total) by mouth every evening.    Dispense:  90 tablet    Refill:  0  . magnesium oxide (MAG-OX) 400 MG tablet    Sig: Take 1 tablet (400 mg total) by mouth 2 (two) times daily.    Dispense:  60 tablet    Refill:  0  . rosuvastatin (CRESTOR) 20 MG tablet    Sig: Take 1 tablet (20 mg total) by mouth 4 (four) times a week.    Dispense:  90 tablet    Refill:  1    Patient Instructions  General Preventive Care  Most recent routine screening lipids/other labs: already done! Following up on anemia and low platelet levels.   Cholesterol: increase Crestor to 4 days per week. Let me know if any muscle aches develop.   Tobacco: don't!   Alcohol: responsible moderation is ok for most adults - if you have concerns about your alcohol intake, please talk to me!   Exercise: as tolerated to reduce risk of cardiovascular disease and diabetes. Strength training will also prevent osteoporosis.   Mental health: starting Lexapro 5 mg, let's recheck in 4-6 weeks to see how moods are doing, please call me sooner if needed!   Sexual health: if need for STD testing, or if concerns with libido/pain problems, please let me know!   Advanced Directive: Living Will and/or Healthcare Power of Attorney recommended for all adults, regardless of age or health.  Vaccines  Flu vaccine: recommended for almost everyone, every fall.   Shingles vaccine: Shingrix recommended after age 77. First shot today, will be due for second in   Pneumonia vaccines: Prevnar and  Pneumovax recommended after age 32. You've had Pneumovax as recommended for diabetics.   Tetanus booster: Tdap recommended every 10 years. Due 08/2026 Cancer screenings    Colon cancer screening: recommended for everyone at age 61. Cologuard ordered. If this is negative, will follow-up in 3 years. If positive, will need to proceed to colonoscopy.   Breast cancer screening: mammogram recommended annually after age 40.   Cervical cancer screening: Pap every 1 to 5 years depending on age and other risk  factors. Can usually stop at age 68 or w/ hysterectomy.   Lung cancer screening: not needed for non-smokers   If we cannot find another explanation for anemia, I strongly advise getting all cancer screening taken care of, including mammogram and Pap.  Infection screenings . HIV, Gonorrhea/Chlamydia: screening as needed. . Hepatitis C: recommended for anyone born 28-1963 . TB: certain at-risk populations Other . Bone Density Test: recommended for women at age 81       Visit summary with medication list and pertinent instructions was printed for patient to review. All questions at time of visit were answered - patient instructed to contact office with any additional concerns or updates. ER/RTC precautions were reviewed with the patient.    Please note: voice recognition software was used to produce this document, and typos may escape review. Please contact Dr. Sheppard Coil for any needed clarifications.     Follow-up plan: Return in about 4 weeks (around 10/23/2018) for Virtual visit - recheck moods. Follow-up A1C and get 2nd Shingles shot around 12/26/2018. Marland Kitchen

## 2018-09-25 NOTE — Patient Instructions (Addendum)
General Preventive Care  Most recent routine screening lipids/other labs: already done! Following up on anemia and low platelet levels.   Cholesterol: increase Crestor to 4 days per week. Let me know if any muscle aches develop.   Tobacco: don't!   Alcohol: responsible moderation is ok for most adults - if you have concerns about your alcohol intake, please talk to me!   Exercise: as tolerated to reduce risk of cardiovascular disease and diabetes. Strength training will also prevent osteoporosis.   Mental health: starting Lexapro 5 mg, let's recheck in 4-6 weeks to see how moods are doing, please call me sooner if needed!   Sexual health: if need for STD testing, or if concerns with libido/pain problems, please let me know!   Advanced Directive: Living Will and/or Healthcare Power of Attorney recommended for all adults, regardless of age or health.  Vaccines  Flu vaccine: recommended for almost everyone, every fall.   Shingles vaccine: Shingrix recommended after age 35. First shot today, will be due for second in   Pneumonia vaccines: Prevnar and Pneumovax recommended after age 66. You've had Pneumovax as recommended for diabetics.   Tetanus booster: Tdap recommended every 10 years. Due 08/2026 Cancer screenings    Colon cancer screening: recommended for everyone at age 56. Cologuard ordered. If this is negative, will follow-up in 3 years. If positive, will need to proceed to colonoscopy.   Breast cancer screening: mammogram recommended annually after age 79.   Cervical cancer screening: Pap every 1 to 5 years depending on age and other risk factors. Can usually stop at age 19 or w/ hysterectomy.   Lung cancer screening: not needed for non-smokers   If we cannot find another explanation for anemia, I strongly advise getting all cancer screening taken care of, including mammogram and Pap.  Infection screenings . HIV, Gonorrhea/Chlamydia: screening as needed. . Hepatitis C:  recommended for anyone born 55-1963 . TB: certain at-risk populations Other . Bone Density Test: recommended for women at age 49

## 2018-09-26 ENCOUNTER — Encounter: Payer: Self-pay | Admitting: Osteopathic Medicine

## 2018-09-26 DIAGNOSIS — D509 Iron deficiency anemia, unspecified: Secondary | ICD-10-CM

## 2018-09-26 HISTORY — DX: Iron deficiency anemia, unspecified: D50.9

## 2018-09-26 LAB — CBC (INCLUDES DIFF/PLT) WITH PATHOLOGIST REVIEW
Absolute Monocytes: 249 cells/uL (ref 200–950)
Basophils Absolute: 39 cells/uL (ref 0–200)
Basophils Relative: 0.9 %
Eosinophils Absolute: 211 cells/uL (ref 15–500)
Eosinophils Relative: 4.9 %
HCT: 31.6 % — ABNORMAL LOW (ref 35.0–45.0)
Hemoglobin: 10.7 g/dL — ABNORMAL LOW (ref 11.7–15.5)
Lymphs Abs: 1105 cells/uL (ref 850–3900)
MCH: 27 pg (ref 27.0–33.0)
MCHC: 33.9 g/dL (ref 32.0–36.0)
MCV: 79.8 fL — ABNORMAL LOW (ref 80.0–100.0)
MPV: 11.6 fL (ref 7.5–12.5)
Monocytes Relative: 5.8 %
Neutro Abs: 2696 cells/uL (ref 1500–7800)
Neutrophils Relative %: 62.7 %
Platelets: 97 10*3/uL — ABNORMAL LOW (ref 140–400)
RBC: 3.96 10*6/uL (ref 3.80–5.10)
RDW: 14.8 % (ref 11.0–15.0)
Total Lymphocyte: 25.7 %
WBC: 4.3 10*3/uL (ref 3.8–10.8)

## 2018-09-26 LAB — IRON,TIBC AND FERRITIN PANEL
%SAT: 8 % (calc) — ABNORMAL LOW (ref 16–45)
Ferritin: 9 ng/mL — ABNORMAL LOW (ref 16–232)
Iron: 35 ug/dL — ABNORMAL LOW (ref 45–160)
TIBC: 460 mcg/dL (calc) — ABNORMAL HIGH (ref 250–450)

## 2018-09-26 MED ORDER — FERROUS SULFATE 325 (65 FE) MG PO TBEC: 325.0000 mg | DELAYED_RELEASE_TABLET | Freq: Two times a day (BID) | ORAL | 1 refills | Status: DC

## 2018-09-26 NOTE — Addendum Note (Signed)
Addended by: Maryla Morrow on: 09/26/2018 01:47 PM   Modules accepted: Orders

## 2018-10-18 ENCOUNTER — Other Ambulatory Visit: Payer: Self-pay | Admitting: Osteopathic Medicine

## 2018-10-18 DIAGNOSIS — E0800 Diabetes mellitus due to underlying condition with hyperosmolarity without nonketotic hyperglycemic-hyperosmolar coma (NKHHC): Secondary | ICD-10-CM

## 2018-10-18 MED FILL — HUMALOG 100 UNITS/ML KWIKPE: 100 | 90 days supply | Qty: 9 | Fill #4

## 2018-10-18 MED FILL — OZEMPIC 0.25 OR 0.5 MG/DOSE: 2 | 28 days supply | Qty: 2 | Fill #4

## 2018-10-18 MED FILL — ACCU-CHEK GUIDE STRP: 34 days supply | Qty: 100 | Fill #2

## 2018-10-19 MED FILL — ULTICARE PEN NDL 8MM 31G: 31G X 8 MM | 90 days supply | Qty: 100 | Fill #0

## 2018-10-23 ENCOUNTER — Encounter: Payer: Self-pay | Admitting: Osteopathic Medicine

## 2018-10-23 ENCOUNTER — Ambulatory Visit (INDEPENDENT_AMBULATORY_CARE_PROVIDER_SITE_OTHER): Payer: No Typology Code available for payment source | Admitting: Osteopathic Medicine

## 2018-10-23 VITALS — Wt 147.0 lb

## 2018-10-23 DIAGNOSIS — F321 Major depressive disorder, single episode, moderate: Secondary | ICD-10-CM | POA: Diagnosis not present

## 2018-10-23 MED ORDER — ESCITALOPRAM OXALATE 10 MG PO TABS: 10.0000 mg | ORAL_TABLET | Freq: Every evening | ORAL | 1 refills | Status: DC

## 2018-10-23 MED FILL — ESCITALOPRAM 10 MG TABLET: 10 | 90 days supply | Qty: 90 | Fill #0

## 2018-10-23 NOTE — Progress Notes (Signed)
Called pt at 820 pm, no answer. Left a vm msg.

## 2018-10-23 NOTE — Progress Notes (Signed)
Virtual Visit via Video (App used: Doximity) Note  I connected with      [name] on 10/23/18 at 8:25 by a telemedicine application and verified that I am speaking with the correct person using two identifiers.  Patient is at home I am in office    I discussed the limitations of evaluation and management by telemedicine and the availability of in person appointments. The patient expressed understanding and agreed to proceed.  History of Present Illness: Meagan Peck is a 58 y.o. female who would like to discuss mood follow-up  At recent annual check-up, discussed depression. Issues w/ husband suffering TBI and being in rehab, she can't see him d/t pandemic precautions. Was having a lot of difficulty sleeping as well.  We started Lexapro 5 mg qhs. Pt reports it's helped a little bit.   Depression screen [facility] 2/9 10/23/2018 09/25/2018 05/15/2018  Decreased Interest 1 3 2   Down, Depressed, Hopeless 1 3 3   PHQ - 2 Score 2 6 5   Altered sleeping 3 3 3   Tired, decreased energy 3 3 3   Change in appetite 3 3 3   Feeling bad or failure about yourself  1 1 1   Trouble concentrating 3 3 3   Moving slowly or fidgety/restless 0 0 0  Suicidal thoughts 0 0 0  PHQ-9 Score 15 19 18   Difficult doing work/chores Somewhat difficult Very difficult -   GAD 7 : Generalized Anxiety Score 10/23/2018 09/25/2018 05/15/2018 02/09/2018  Nervous, Anxious, on Edge 2 3 3 2   Control/stop worrying 3 3 3 1   Worry too much - different things 3 3 3 3   Trouble relaxing 2 3 3  0  Restless 1 2 3  0  Easily annoyed or irritable 3 3 3 3   Afraid - awful might happen 2 2 3 1   Total GAD 7 Score 16 19 21 10   Anxiety Difficulty Somewhat difficult Very difficult Extremely difficult Somewhat difficult         Observations/Objective: Wt 147 lb (66.7 kg)   LMP 12/03/2013   BMI 30.72 kg/m  BP Readings from Last 3 Encounters:  09/25/18 (!) 129/59  05/15/18 (!) 117/52  02/09/18 119/62   Exam: Normal Speech.   NAD  Lab and Radiology Results No results found for this or any previous visit (from the past 72 hour(s)). No results found.     Assessment and Plan: 58 y.o. female with The encounter diagnosis was Depression, major, single episode, moderate ([place]).  Patient reporting some improvement, tolerating medication well, we decided to increase Lexapro from 5 mg to 10 mg  PDMP not reviewed this encounter. No orders of the defined types were placed in this encounter.  Meds ordered this encounter  Medications  . escitalopram (LEXAPRO) 10 MG tablet    Sig: Take 1 tablet (10 mg total) by mouth every evening.    Dispense:  90 tablet    Refill:  1    Cancel 5 mg dose, thanks!   There are no Patient Instructions on file for this visit.  Instructions sent via MyChart. If MyChart not available, pt was given option for info via personal e-mail w/ no guarantee of protected health info over unsecured e-mail communication, and MyChart sign-up instructions were included.   Follow Up Instructions: Return in about 4 weeks (around 11/20/2018) for VIRTUAL VISIT recheck moods on increased Lexapro, see me or call sooner if needed .    I discussed the assessment and treatment plan with the patient. The patient  was provided an opportunity to ask questions and all were answered. The patient agreed with the plan and demonstrated an understanding of the instructions.   The patient was advised to call back or seek an in-person evaluation if any new concerns, if symptoms worsen or if the condition fails to improve as anticipated.  15 minutes of non-face-to-face time was provided during this encounter.                      Historical information moved to improve visibility of documentation.  Past Medical History:  Diagnosis Date  . Diabetes mellitus without complication ([place])   . GERD (gastroesophageal reflux disease)   . Hypertension   . Iron deficiency anemia 09/26/2018   Past Surgical  History:  Procedure Laterality Date  . CARPAL TUNNEL RELEASE     right hand  . CESAREAN SECTION    . CHOLECYSTECTOMY    . KNEE ARTHROSCOPY Right 12/10/2015   Procedure: RIGHT KNEE ARTHROSCOPY WITH PARTIAL MEDIAL MENISCECTOMY;  Surgeon: Mcarthur Rossetti, MD;  Location: [facility];  Service: Orthopedics;  Laterality: Right;  . KNEE ARTHROSCOPY W/ MENISCAL REPAIR     left knee   Social History   Tobacco Use  . Smoking status: Never Smoker  . Smokeless tobacco: Never Used  Substance Use Topics  . Alcohol use: No   family history includes Alcoholism in her father, maternal uncle, and paternal uncle; COPD in her mother; Diabetes in her brother and paternal grandmother; Heart attack in her father; Hypertension in her mother.  Medications: Current Outpatient Medications  Medication Sig Dispense Refill  . ACCU-CHEK FASTCLIX LANCETS MISC Check fsbs TID    . Cholecalciferol (VITAMIN D3) 5000 UNITS TABS Take 5,000 Units by mouth daily.     . diclofenac sodium (VOLTAREN) 1 % GEL Apply 4 g topically 4 (four) times daily. To affected joint. 100 g 11  . escitalopram (LEXAPRO) 10 MG tablet Take 1 tablet (10 mg total) by mouth every evening. 90 tablet 1  . ferrous sulfate 325 (65 FE) MG EC tablet Take 1 tablet (325 mg total) by mouth 2 (two) times daily with a meal. 180 tablet 1  . glucose blood (ACCU-CHEK GUIDE) test strip Use to check blood sugar 3 times daily. Dx:E08.00 100 each 12  . ibuprofen (ADVIL,MOTRIN) 800 MG tablet Take 1 tablet (800 mg total) by mouth every 8 (eight) hours as needed for moderate pain. 60 tablet 0  . Insulin Glargine (LANTUS SOLOSTAR) 100 UNIT/ML Solostar Pen Inject 12-14 Units into the skin daily. Per protocol 15 mL 3  . insulin lispro (HUMALOG) 100 UNIT/ML KiwkPen Inject 0.1 mLs (10 Units total) into the skin daily. Prior to largest meal of the day 15 mL 5  . Insulin Pen Needle (ULTICARE SHORT PEN NEEDLES) 31G X 8 MM MISC USE AS DIRECTED TO INJECT LANTUS AND BYETTA 100  each 12  . Krill Oil 500 MG CAPS Take 500 mg by mouth daily.    Marland Kitchen lisinopril (PRINIVIL,ZESTRIL) 40 MG tablet TAKE 1 TABLET (40 MG TOTAL) BY MOUTH DAILY. 90 tablet 1  . loperamide (IMODIUM) 2 MG capsule Take 1 capsule (2 mg total) by mouth as needed for diarrhea or loose stools (30 minutes prior to meals). 30 capsule 0  . magnesium oxide (MAG-OX) 400 MG tablet Take 1 tablet (400 mg total) by mouth 2 (two) times daily. 60 tablet 0  . metFORMIN (GLUCOPHAGE) 1000 MG tablet Take 1 tablet (1,000 mg total) by mouth 2 (  two) times daily with a meal. 90 tablet 3  . rosuvastatin (CRESTOR) 20 MG tablet Take 1 tablet (20 mg total) by mouth 4 (four) times a week. 90 tablet 1  . Semaglutide,0.25 or 0.5MG /DOS, (OZEMPIC, 0.25 OR 0.5 MG/DOSE,) 2 MG/1.5ML SOPN Inject 0.5 mg into the skin once a week. 4 pen 5  . aspirin 81 MG tablet Take 81 mg by mouth daily.    Marland Kitchen FeFum-FePoly-FA-B Cmp-C-Biot (FOLIVANE-PLUS) CAPS Take 1 capsule by mouth daily.  5   No current facility-administered medications for this visit.    Allergies  Allergen Reactions  . Penicillins Anaphylaxis    Happened as a young child  . Atrovent [Ipratropium] Palpitations    As per patient  . Naproxen Rash  . Quinine Derivatives Rash  . Sulfa Antibiotics Rash    PDMP not reviewed this encounter. No orders of the defined types were placed in this encounter.  Meds ordered this encounter  Medications  . escitalopram (LEXAPRO) 10 MG tablet    Sig: Take 1 tablet (10 mg total) by mouth every evening.    Dispense:  90 tablet    Refill:  1    Cancel 5 mg dose, thanks!

## 2018-10-25 ENCOUNTER — Telehealth: Payer: Self-pay

## 2018-10-25 NOTE — Telephone Encounter (Signed)
lvm for pt to call to schedule. Thanks

## 2018-10-25 NOTE — Telephone Encounter (Signed)
-----   Message from [name], DO sent at 10/23/2018  8:51 AM EDT ----- Please schedule virtual follow-up in 4 weeks, recheck depression, thanks

## 2018-10-30 MED FILL — metFORMIN HCL 1000 MG TABS: 1000 | 45 days supply | Qty: 90 | Fill #3

## 2018-11-07 ENCOUNTER — Other Ambulatory Visit: Payer: Self-pay

## 2018-11-07 MED ORDER — ROSUVASTATIN CALCIUM 20 MG PO TABS: 20.0000 mg | ORAL_TABLET | ORAL | 1 refills | Status: DC

## 2018-11-12 ENCOUNTER — Other Ambulatory Visit: Payer: Self-pay

## 2018-11-12 MED ORDER — ROSUVASTATIN CALCIUM 20 MG PO TABS: 20.0000 mg | ORAL_TABLET | ORAL | 1 refills | Status: DC

## 2018-11-12 MED FILL — ROSUVASTATIN CALCIUM 20 MG: 20 | 84 days supply | Qty: 48 | Fill #0

## 2018-11-26 MED FILL — ROSUVASTATIN CALCIUM 20 MG: 20 | 84 days supply | Qty: 48 | Fill #0

## 2018-11-26 MED FILL — OZEMPIC 0.25 OR 0.5 MG/DOSE: 2 | 28 days supply | Qty: 2 | Fill #5

## 2018-11-27 ENCOUNTER — Other Ambulatory Visit: Payer: Self-pay | Admitting: Osteopathic Medicine

## 2018-11-27 DIAGNOSIS — E0800 Diabetes mellitus due to underlying condition with hyperosmolarity without nonketotic hyperglycemic-hyperosmolar coma (NKHHC): Secondary | ICD-10-CM

## 2018-11-27 MED ORDER — LANTUS SOLOSTAR 100 UNIT/ML ~~LOC~~ SOPN: 12.0000 [IU] | PEN_INJECTOR | Freq: Every day | SUBCUTANEOUS | 3 refills | Status: DC

## 2018-11-27 MED ORDER — LISINOPRIL 40 MG PO TABS: 40.0000 mg | ORAL_TABLET | Freq: Every day | ORAL | 1 refills | Status: DC

## 2018-11-27 MED FILL — LISINOPRIL 40 MG TABLET: 40 | 90 days supply | Qty: 90 | Fill #0

## 2018-11-27 MED FILL — LANTUS SOLOSTAR 100 UNITS/M: 100 | 86 days supply | Qty: 12 | Fill #0

## 2018-12-17 ENCOUNTER — Other Ambulatory Visit: Payer: Self-pay | Admitting: Osteopathic Medicine

## 2018-12-17 MED FILL — metFORMIN HCL 1000 MG TABS: 1000 | 45 days supply | Qty: 90 | Fill #0

## 2018-12-17 NOTE — Telephone Encounter (Signed)
Please advise 

## 2018-12-25 MED FILL — ULTICARE PEN NDL 8MM 31G: 31G X 8 MM | 90 days supply | Qty: 100 | Fill #1

## 2018-12-26 ENCOUNTER — Ambulatory Visit: Payer: No Typology Code available for payment source | Admitting: Osteopathic Medicine

## 2018-12-26 ENCOUNTER — Ambulatory Visit (INDEPENDENT_AMBULATORY_CARE_PROVIDER_SITE_OTHER): Payer: No Typology Code available for payment source | Admitting: Osteopathic Medicine

## 2018-12-26 VITALS — BP 123/60 | HR 73 | Temp 97.6°F | Ht <= 58 in | Wt 147.0 lb

## 2018-12-26 DIAGNOSIS — Z23 Encounter for immunization: Secondary | ICD-10-CM

## 2018-12-26 NOTE — Progress Notes (Signed)
Patient was seen for Shingrix vaccine. immunization was given without any complications. Rhonda Cunningham,CMA

## 2019-01-01 ENCOUNTER — Ambulatory Visit (INDEPENDENT_AMBULATORY_CARE_PROVIDER_SITE_OTHER): Payer: No Typology Code available for payment source | Admitting: Osteopathic Medicine

## 2019-01-01 ENCOUNTER — Other Ambulatory Visit: Payer: Self-pay

## 2019-01-01 ENCOUNTER — Encounter: Payer: Self-pay | Admitting: Osteopathic Medicine

## 2019-01-01 VITALS — BP 130/61 | HR 78 | Temp 98.4°F | Wt 149.0 lb

## 2019-01-01 DIAGNOSIS — R252 Cramp and spasm: Secondary | ICD-10-CM

## 2019-01-01 DIAGNOSIS — E119 Type 2 diabetes mellitus without complications: Secondary | ICD-10-CM

## 2019-01-01 DIAGNOSIS — Z23 Encounter for immunization: Secondary | ICD-10-CM

## 2019-01-01 DIAGNOSIS — Z794 Long term (current) use of insulin: Secondary | ICD-10-CM

## 2019-01-01 LAB — POCT GLYCOSYLATED HEMOGLOBIN (HGB A1C): Hemoglobin A1C: 5.1 % (ref 4.0–5.6)

## 2019-01-01 NOTE — Progress Notes (Signed)
HPI: Meagan Peck is a 58 y.o. female who  has a past medical history of Diabetes mellitus without complication ([place]), GERD (gastroesophageal reflux disease), Hypertension, and Iron deficiency anemia (09/26/2018).  she presents to [facility] today, 01/01/19,  for chief complaint of:  A1C follow-up   Diabetes: Medications now:   Humalog 10 Units daily w/ largest meal   Lantus 12-14 Units daily   Ozempic 0.5 mg weekly   Metformin 1000 mg bid  A1C records  at [facility], 02/13/2017. Foot Exam was performed at that visit. A1c at that point was 8.0%.   A1c was looking a little bit better here 05/15/17 at 7.6%.  08/11/17: 7.1% yay! No changes made to meds   11/10/17: 6.8% yay! Concern for syncopal episodes d/t hypoglycemia. We stopped Glipizide.  Mag level was also quite low.   02/09/18: 6.3%  05/15/18:  6.5% and pt is doing well no hypoglycemia  09/21/18: 6.1%  Today 01/01/19 5.1% yay!    Depression Doing well on current medication, feeling better. Husband is home from rehab. Recent move has been stressful but feels coping ok.  Depression screen [facility] 2/9 01/01/2019 10/23/2018 09/25/2018  Decreased Interest 1 1 3   Down, Depressed, Hopeless 1 1 3   PHQ - 2 Score 2 2 6   Altered sleeping 1 3 3   Tired, decreased energy 3 3 3   Change in appetite 2 3 3   Feeling bad or failure about yourself  0 1 1  Trouble concentrating 1 3 3   Moving slowly or fidgety/restless 0 0 0  Suicidal thoughts 0 0 0  PHQ-9 Score 9 15 19   Difficult doing work/chores - Somewhat difficult Very difficult      Muscle cramps, calves, randomly, resolve spontaneously, magesium supplements don't seem to be helping.      At today's visit 01/01/19 ... PMH, PSH, FH reviewed and updated as needed.  Current medication list and allergy/intolerance hx reviewed and updated as needed. (See remainder of HPI, ROS, Phys Exam below)   No results found.  Results for orders  placed or performed in visit on 01/01/19 (from the past 72 hour(s))  POCT HgB A1C     Status: None   Collection Time: 01/01/19  8:13 AM  Result Value Ref Range   Hemoglobin A1C 5.1 4.0 - 5.6 %   HbA1c POC (<> result, manual entry)     HbA1c, POC (prediabetic range)     HbA1c, POC (controlled diabetic range)            ASSESSMENT/PLAN: The primary encounter diagnosis was Type 2 diabetes mellitus without complication, with long-term current use of insulin ([place]). Diagnoses of Need for influenza vaccination and Muscle cramping were also pertinent to this visit.   Orders Placed This Encounter  Procedures  . Flu Vaccine QUAD 6+ mos PF IM (Fluarix Quad PF)  . BASIC METABOLIC PANEL WITH GFR  . Magnesium  . POCT HgB A1C     No orders of the defined types were placed in this encounter.   Patient Instructions  Will come off the Humalog insulin  All other medications same .       Follow-up plan: Return in about 3 months (around 04/02/2019) for follow-up A1C, see me sooner if needed! .                                                 ################################################# ################################################# ################################################# #################################################  Current Meds  Medication Sig  . ACCU-CHEK FASTCLIX LANCETS MISC Check fsbs TID  . aspirin 81 MG tablet Take 81 mg by mouth daily.  . Cholecalciferol (VITAMIN D3) 5000 UNITS TABS Take 5,000 Units by mouth daily.   . diclofenac sodium (VOLTAREN) 1 % GEL Apply 4 g topically 4 (four) times daily. To affected joint.  Marland Kitchen escitalopram (LEXAPRO) 10 MG tablet Take 1 tablet (10 mg total) by mouth every evening.  Marland Kitchen FeFum-FePoly-FA-B Cmp-C-Biot (FOLIVANE-PLUS) CAPS Take 1 capsule by mouth daily.  . ferrous sulfate 325 (65 FE) MG EC tablet Take 1 tablet (325 mg total) by mouth 2 (two) times daily with a meal.  .  glucose blood (ACCU-CHEK GUIDE) test strip Use to check blood sugar 3 times daily. Dx:E08.00  . ibuprofen (ADVIL,MOTRIN) 800 MG tablet Take 1 tablet (800 mg total) by mouth every 8 (eight) hours as needed for moderate pain.  . Insulin Glargine (LANTUS SOLOSTAR) 100 UNIT/ML Solostar Pen Inject 12-14 Units into the skin daily. Per protocol  . insulin lispro (HUMALOG) 100 UNIT/ML KiwkPen Inject 0.1 mLs (10 Units total) into the skin daily. Prior to largest meal of the day  . Insulin Pen Needle (ULTICARE SHORT PEN NEEDLES) 31G X 8 MM MISC USE AS DIRECTED TO INJECT LANTUS AND BYETTA  . Krill Oil 500 MG CAPS Take 500 mg by mouth daily.  Marland Kitchen lisinopril (ZESTRIL) 40 MG tablet Take 1 tablet (40 mg total) by mouth daily.  Marland Kitchen loperamide (IMODIUM) 2 MG capsule Take 1 capsule (2 mg total) by mouth as needed for diarrhea or loose stools (30 minutes prior to meals).  . metFORMIN (GLUCOPHAGE) 1000 MG tablet TAKE 1 TABLET (1,000 MG TOTAL) BY MOUTH 2 (TWO) TIMES DAILY WITH A MEAL.  . rosuvastatin (CRESTOR) 20 MG tablet Take 1 tablet (20 mg total) by mouth 4 (four) times a week.  . Semaglutide,0.25 or 0.5MG /DOS, (OZEMPIC, 0.25 OR 0.5 MG/DOSE,) 2 MG/1.5ML SOPN Inject 0.5 mg into the skin once a week.    Allergies  Allergen Reactions  . Penicillins Anaphylaxis    Happened as a young child  . Atrovent [Ipratropium] Palpitations    As per patient  . Naproxen Rash  . Quinine Derivatives Rash  . Sulfa Antibiotics Rash       Review of Systems:  Constitutional: No recent illness  HEENT: No  headache, no vision change  Cardiac: No  chest pain, No  pressure, No palpitations  Respiratory:  No  shortness of breath. No  Cough  Gastrointestinal: No  abdominal pain  Musculoskeletal: +new myalgia/arthralgia  Skin: No  Rash  Neurologic: No  weakness, No  Dizziness  Psychiatric: +concerns with depression - stable/improved , No  concerns with anxiety  Exam:  BP 130/61 (BP Location: Left Arm, Patient Position:  Sitting, Cuff Size: Normal)   Pulse 78   Temp 98.4 F (36.9 C) (Oral)   Wt 149 lb (67.6 kg)   LMP 12/03/2013   BMI 31.14 kg/m   Constitutional: VS see above. General Appearance: alert, well-developed, well-nourished, NAD  Eyes: Normal lids and conjunctive, non-icteric sclera  Neck: No masses, trachea midline.   Respiratory: Normal respiratory effort.  Musculoskeletal: Gait normal. Symmetric and independent movement of all extremities  Neurological: Normal balance/coordination. No tremor.  Skin: warm, dry, intact.   Psychiatric: Normal judgment/insight. Normal mood and affect. Oriented x3.       Visit summary with medication list and pertinent instructions was printed for patient to review, patient was advised to alert  [place] if any updates are needed. All questions at time of visit were answered - patient instructed to contact office with any additional concerns. ER/RTC precautions were reviewed with the patient and understanding verbalized.   Note: Total time spent 25 minutes, greater than 50% of the visit was spent face-to-face counseling and coordinating care for the following: The primary encounter diagnosis was Type 2 diabetes mellitus without complication, with long-term current use of insulin ([place]). A diagnosis of Need for influenza vaccination was also pertinent to this visit.Marland Kitchen  Please note: voice recognition software was used to produce this document, and typos may escape review. Please contact Dr. Sheppard Coil for any needed clarifications.    Follow up plan: Return in about 3 months (around 04/02/2019) for follow-up A1C, see me sooner if needed! Marland Kitchen

## 2019-01-01 NOTE — Patient Instructions (Signed)
Will come off the Humalog insulin  All other medications same .

## 2019-01-02 ENCOUNTER — Encounter: Payer: Self-pay | Admitting: Osteopathic Medicine

## 2019-01-02 DIAGNOSIS — Z79899 Other long term (current) drug therapy: Secondary | ICD-10-CM

## 2019-01-02 DIAGNOSIS — E119 Type 2 diabetes mellitus without complications: Secondary | ICD-10-CM

## 2019-01-02 DIAGNOSIS — Z794 Long term (current) use of insulin: Secondary | ICD-10-CM

## 2019-01-02 LAB — BASIC METABOLIC PANEL WITH GFR
BUN: 9 mg/dL (ref 7–25)
CO2: 23 mmol/L (ref 20–32)
Calcium: 8.9 mg/dL (ref 8.6–10.4)
Chloride: 104 mmol/L (ref 98–110)
Creat: 0.59 mg/dL (ref 0.50–1.05)
GFR, Est African American: 119 mL/min/{1.73_m2} (ref >=60)
GFR, Est Non African American: 102 mL/min/{1.73_m2} (ref >=60)
Glucose, Bld: 74 mg/dL (ref 65–99)
Potassium: 4 mmol/L (ref 3.5–5.3)
Sodium: 137 mmol/L (ref 135–146)

## 2019-01-02 LAB — MAGNESIUM: Magnesium: 1.1 mg/dL — ABNORMAL LOW (ref 1.5–2.5)

## 2019-01-02 MED ORDER — CETIRIZINE HCL 10 MG PO TABS: 10.0000 mg | ORAL_TABLET | Freq: Every day | ORAL | 3 refills | Status: AC

## 2019-01-02 MED ORDER — MAGNESIUM OXIDE 400 MG PO TABS: 400.0000 mg | ORAL_TABLET | Freq: Two times a day (BID) | ORAL | 3 refills | Status: DC

## 2019-01-02 MED FILL — CETIRIZINE HCL 10 MG TABS: 10 | 100 days supply | Qty: 100 | Fill #0

## 2019-01-02 MED FILL — MAGNESIUM OXIDE 400 MG TAB: 400 (240 MG | 90 days supply | Qty: 180 | Fill #0

## 2019-01-03 MED FILL — OZEMPIC 0.25 OR 0.5 MG/DOSE: 2 | 28 days supply | Qty: 2 | Fill #6

## 2019-02-01 MED FILL — metFORMIN HCL 1000 MG TABS: 1000 | 45 days supply | Qty: 90 | Fill #1

## 2019-02-01 MED FILL — ACCU-CHEK GUIDE STRP: 34 days supply | Qty: 100 | Fill #3

## 2019-02-11 ENCOUNTER — Encounter: Payer: Self-pay | Admitting: Osteopathic Medicine

## 2019-02-20 ENCOUNTER — Encounter: Payer: Self-pay | Admitting: Osteopathic Medicine

## 2019-02-20 DIAGNOSIS — E119 Type 2 diabetes mellitus without complications: Secondary | ICD-10-CM

## 2019-02-20 DIAGNOSIS — Z794 Long term (current) use of insulin: Secondary | ICD-10-CM

## 2019-02-28 MED FILL — ESCITALOPRAM 10 MG TABLET: 10 | 90 days supply | Qty: 90 | Fill #1

## 2019-02-28 MED FILL — LANTUS SOLOSTAR 100 UNITS/M: 100 | 86 days supply | Qty: 12 | Fill #1

## 2019-02-28 MED FILL — LISINOPRIL 40 MG TABLET: 40 | 90 days supply | Qty: 90 | Fill #1

## 2019-02-28 MED FILL — ROSUVASTATIN CALCIUM 20 MG: 20 | 84 days supply | Qty: 48 | Fill #1

## 2019-03-08 MED FILL — OZEMPIC 0.25 OR 0.5 MG/DOSE: 2 | 28 days supply | Qty: 2 | Fill #7

## 2019-03-18 MED FILL — metFORMIN HCL 1000 MG TABS: 1000 | 45 days supply | Qty: 90 | Fill #2

## 2019-04-02 ENCOUNTER — Ambulatory Visit (INDEPENDENT_AMBULATORY_CARE_PROVIDER_SITE_OTHER): Payer: No Typology Code available for payment source | Admitting: Osteopathic Medicine

## 2019-04-02 ENCOUNTER — Encounter: Payer: Self-pay | Admitting: Osteopathic Medicine

## 2019-04-02 ENCOUNTER — Other Ambulatory Visit: Payer: Self-pay

## 2019-04-02 VITALS — BP 118/76 | HR 66 | Temp 97.9°F | Wt 156.1 lb

## 2019-04-02 DIAGNOSIS — Z794 Long term (current) use of insulin: Secondary | ICD-10-CM | POA: Diagnosis not present

## 2019-04-02 DIAGNOSIS — E119 Type 2 diabetes mellitus without complications: Secondary | ICD-10-CM | POA: Diagnosis not present

## 2019-04-02 LAB — POCT GLYCOSYLATED HEMOGLOBIN (HGB A1C): Hemoglobin A1C: 5 % (ref 4.0–5.6)

## 2019-04-02 MED FILL — MAGNESIUM OXIDE 400 MG TAB: 400 (240 MG | 60 days supply | Qty: 120 | Fill #1

## 2019-04-02 NOTE — Progress Notes (Signed)
HPI: Meagan Peck is a 58 y.o. female who  has a past medical history of Diabetes mellitus without complication ([place]), GERD (gastroesophageal reflux disease), Hypertension, and Iron deficiency anemia (09/26/2018).  she presents to [facility] today, 04/02/19,  for chief complaint of:  DM2 follow-up   Medications:   Humalog 10 Units daily w/ largest meal - we came off this at last visit 01/01/19 since A1C was 5.1%  Lantus 12-14Units daily   Ozempic 0.5 mg weekly   Metformin 1000 mg bid  A1C records  at [facility], 02/13/2017: 8.0%.   Here, 05/15/17: 7.6%.  08/11/17: 7.1% yay! No changes made to meds   11/10/17: 6.8% yay! Concern for syncopal episodes d/t hypoglycemia. We stopped Glipizide. Mag level was also quite low.   02/09/18: 6.3%  05/15/18: 6.5% and pt is doing well no hypoglycemia  09/21/18: 6.1%  01/01/19: 5.1% yay!   Today 04/02/19: 5.0% few times a week will have hypoglycemia into the 60s  BP goal <130/80: Yes   BP Readings from Last 3 Encounters:  04/02/19 118/76  01/01/19 130/61  12/26/18 123/60    LDL goal <70: close, was 80s 08/2018 Eye exam annually: none on file, importance discussed with patient Foot exam: needs Microalbuminuria:n/a on ACEI Metformin: Yes  ACE/ARB: Yes  Antiplatelet if ASCVD Risk >10%: n/a Statin: Yes  Pneumovax: Yes   The 10-year ASCVD risk score Mikey Bussing DC Jr., et al., 2013) is: 3.8%   Values used to calculate the score:     Age: 73 years     Sex: Female     Is Non-Hispanic African American: No     Diabetic: Yes     Tobacco smoker: No     Systolic Blood Pressure: 762 mmHg     Is BP treated: Yes     HDL Cholesterol: 53 mg/dL     Total Cholesterol: 151 mg/dL  Immunization History  Administered Date(s) Administered  . DTaP 03/16/1963, 04/15/1963, 05/16/1963, 01/02/1965, 08/05/1968  . Hepatitis B 10/27/2003  . IPV 03/16/1963, 05/16/1963, 05/05/1978  . Influenza,inj,Quad PF,6+  Mos 02/02/2016, 01/01/2019  . Influenza-Unspecified 02/02/2016, 01/30/2017, 01/11/2018  . MMR 08/15/2003  . Measles / Rubella 03/18/1970  . Pneumococcal Polysaccharide-23 08/09/2016  . Smallpox 03/07/1969  . Tdap 08/09/2016  . Tetanus 08/04/2003  . Zoster Recombinat (Shingrix) 09/25/2018, 12/26/2018    Wt Readings from Last 3 Encounters:  04/02/19 156 lb 1.9 oz (70.8 kg)  01/01/19 149 lb (67.6 kg)  12/26/18 147 lb (66.7 kg)        At today's visit 04/02/19 ... PMH, PSH, FH reviewed and updated as needed.  Current medication list and allergy/intolerance hx reviewed and updated as needed. (See remainder of HPI, ROS, Phys Exam below)   No results found.  Results for orders placed or performed in visit on 04/02/19 (from the past 72 hour(s))  POCT HgB A1C     Status: None   Collection Time: 04/02/19  9:17 AM  Result Value Ref Range   Hemoglobin A1C 5.0 4.0 - 5.6 %   HbA1c POC (<> result, manual entry)     HbA1c, POC (prediabetic range)     HbA1c, POC (controlled diabetic range)            ASSESSMENT/PLAN: The encounter diagnosis was Type 2 diabetes mellitus without complication, with long-term current use of insulin ([place]).   Orders Placed This Encounter  Procedures  . POCT HgB A1C     No orders of the defined  types were placed in this encounter.   Patient Instructions  Plan:  Let's decrease the Lantus from 12 units down to 10 units for a few days, then 5 for a few days then stop. Let me know what the fasting sugars are, and how many units of insulin you had taken the night before.   We might be able to get you off insulin! Depending on how sugars / A1C is looking. We might increase Ozempic from 0.5 to 1 mg weekly.          Follow-up plan: Return for RECHECK PENDING SUGAR RESULTS / IF WORSE OR  CHANGE.                                                 ################################################# ################################################# ################################################# #################################################    Current Meds  Medication Sig  . ACCU-CHEK FASTCLIX LANCETS MISC Check fsbs TID  . aspirin 81 MG tablet Take 81 mg by mouth daily.  . cetirizine (ZYRTEC) 10 MG tablet Take 1 tablet (10 mg total) by mouth daily.  . Cholecalciferol (VITAMIN D3) 5000 UNITS TABS Take 5,000 Units by mouth daily.   . diclofenac sodium (VOLTAREN) 1 % GEL Apply 4 g topically 4 (four) times daily. To affected joint.  Marland Kitchen escitalopram (LEXAPRO) 10 MG tablet Take 1 tablet (10 mg total) by mouth every evening.  Marland Kitchen FeFum-FePoly-FA-B Cmp-C-Biot (FOLIVANE-PLUS) CAPS Take 1 capsule by mouth daily.  . ferrous sulfate 325 (65 FE) MG EC tablet Take 1 tablet (325 mg total) by mouth 2 (two) times daily with a meal.  . glucose blood (ACCU-CHEK GUIDE) test strip Use to check blood sugar 3 times daily. Dx:E08.00  . ibuprofen (ADVIL,MOTRIN) 800 MG tablet Take 1 tablet (800 mg total) by mouth every 8 (eight) hours as needed for moderate pain.  . Insulin Glargine (LANTUS SOLOSTAR) 100 UNIT/ML Solostar Pen Inject 12-14 Units into the skin daily. Per protocol  . Insulin Pen Needle (ULTICARE SHORT PEN NEEDLES) 31G X 8 MM MISC USE AS DIRECTED TO INJECT LANTUS AND BYETTA  . Krill Oil 500 MG CAPS Take 500 mg by mouth daily.  Marland Kitchen lisinopril (ZESTRIL) 40 MG tablet Take 1 tablet (40 mg total) by mouth daily.  Marland Kitchen loperamide (IMODIUM) 2 MG capsule Take 1 capsule (2 mg total) by mouth as needed for diarrhea or loose stools (30 minutes prior to meals).  . magnesium oxide (MAG-OX) 400 MG tablet Take 1 tablet (400 mg total) by mouth 2 (two) times daily.  . metFORMIN (GLUCOPHAGE) 1000 MG tablet TAKE 1 TABLET (1,000 MG TOTAL) BY MOUTH 2 (TWO) TIMES DAILY  WITH A MEAL.  . rosuvastatin (CRESTOR) 20 MG tablet Take 1 tablet (20 mg total) by mouth 4 (four) times a week.  . Semaglutide,0.25 or 0.5MG/DOS, (OZEMPIC, 0.25 OR 0.5 MG/DOSE,) 2 MG/1.5ML SOPN Inject 0.5 mg into the skin once a week.    Allergies  Allergen Reactions  . Penicillins Anaphylaxis    Happened as a young child  . Atrovent [Ipratropium] Palpitations    As per patient  . Naproxen Rash  . Quinine Derivatives Rash  . Sulfa Antibiotics Rash       Review of Systems:  Constitutional: No recent illness  HEENT: No  headache, no vision change  Cardiac: No  chest pain, No  pressure, No palpitations  Neurologic: No  weakness, No  Dizziness   Exam:  BP 118/76 (BP Location: Left Arm, Patient Position: Sitting, Cuff Size: Normal)   Pulse 66   Temp 97.9 F (36.6 C) (Oral)   Wt 156 lb 1.9 oz (70.8 kg)   LMP 12/03/2013   BMI 32.63 kg/m   Constitutional: VS see above. General Appearance: alert, well-developed, well-nourished, NAD  Eyes: Normal lids and conjunctive, non-icteric sclera.  Neck: No masses, trachea midline.   Respiratory: Normal respiratory effort.   Musculoskeletal: Gait normal. Symmetric and independent movement of all extremities  Neurological: Normal balance/coordination. No tremor.  Skin: warm, dry, intact.   Psychiatric: Normal judgment/insight. Normal mood and affect. Oriented x3.       Visit summary with medication list and pertinent instructions was printed for patient to review, patient was advised to alert [place] if any updates are needed. All questions at time of visit were answered - patient instructed to contact office with any additional concerns. ER/RTC precautions were reviewed with the patient and understanding verbalized.   Note: Total time spent 25 minutes, greater than 50% of the visit was spent face-to-face counseling and coordinating care for the following: The encounter diagnosis was Type 2 diabetes mellitus without complication,  with long-term current use of insulin ([place])..  Please note: voice recognition software was used to produce this document, and typos may escape review. Please contact Dr. Sheppard Coil for any needed clarifications.    Follow up plan: Return for RECHECK PENDING SUGAR RESULTS / IF WORSE OR CHANGE.

## 2019-04-02 NOTE — Patient Instructions (Addendum)
Plan:  Let's decrease the Lantus from 12 units down to 10 units for a few days, then 5 for a few days then stop. Let me know what the fasting sugars are, and how many units of insulin you had taken the night before.   We might be able to get you off insulin! Depending on how sugars / A1C is looking. We might increase Ozempic from 0.5 to 1 mg weekly.

## 2019-05-10 MED FILL — metFORMIN HCL 1000 MG TABS: 1000 | 45 days supply | Qty: 90 | Fill #3

## 2019-05-10 MED FILL — OZEMPIC 0.25 OR 0.5 MG/DOSE: 2 | 28 days supply | Qty: 2 | Fill #8

## 2019-05-14 ENCOUNTER — Telehealth: Payer: Self-pay

## 2019-05-14 NOTE — Telephone Encounter (Signed)
As per [facility] - new rx required for pt. Insurance no longer covering Accu-chek meter and supplies. Requesting a rx for Freestyle Lite meter, test strips, lancets. Pls specify how often pt she check blood sugar on rx.

## 2019-05-16 ENCOUNTER — Encounter: Payer: Self-pay | Admitting: Osteopathic Medicine

## 2019-05-16 MED ORDER — BLOOD GLUCOSE MONITOR KIT: PACK | 99 refills | Status: DC

## 2019-05-17 MED FILL — FREESTYLE LANCETS: 75 days supply | Qty: 300 | Fill #0

## 2019-05-17 MED FILL — FREESTYLE LITE METER: 30 days supply | Qty: 1 | Fill #0

## 2019-05-17 MED FILL — FREESTYLE LITE TEST STRIP: 75 days supply | Qty: 300 | Fill #0

## 2019-05-29 ENCOUNTER — Encounter: Payer: Self-pay | Admitting: Osteopathic Medicine

## 2019-05-29 DIAGNOSIS — H269 Unspecified cataract: Secondary | ICD-10-CM

## 2019-05-31 ENCOUNTER — Other Ambulatory Visit: Payer: Self-pay | Admitting: Osteopathic Medicine

## 2019-05-31 MED FILL — ESCITALOPRAM 10 MG TABLET: 10 | 90 days supply | Qty: 90 | Fill #0

## 2019-05-31 MED FILL — ROSUVASTATIN CALCIUM 20 MG: 20 | 84 days supply | Qty: 48 | Fill #2

## 2019-05-31 MED FILL — LISINOPRIL 40 MG TABLET: 40 | 90 days supply | Qty: 90 | Fill #0

## 2019-05-31 NOTE — Telephone Encounter (Signed)
Please review for refill- patient at [facility] 

## 2019-06-01 ENCOUNTER — Encounter: Payer: Self-pay | Admitting: Osteopathic Medicine

## 2019-06-03 NOTE — Telephone Encounter (Signed)
Dr. Sheppard Coil can you put the referral in for [facility], that is where the patient requested.

## 2019-06-26 ENCOUNTER — Encounter: Payer: Self-pay | Admitting: Osteopathic Medicine

## 2019-06-27 NOTE — Telephone Encounter (Signed)
I dont see this appt scheduled. Can you please call and get her scheduled?

## 2019-07-01 ENCOUNTER — Other Ambulatory Visit: Payer: Self-pay | Admitting: Osteopathic Medicine

## 2019-07-03 MED FILL — metFORMIN HCL 1000 MG TABS: 1000 | 45 days supply | Qty: 90 | Fill #0

## 2019-07-22 ENCOUNTER — Encounter: Payer: Self-pay | Admitting: Osteopathic Medicine

## 2019-07-22 ENCOUNTER — Other Ambulatory Visit: Payer: Self-pay

## 2019-07-22 ENCOUNTER — Ambulatory Visit (INDEPENDENT_AMBULATORY_CARE_PROVIDER_SITE_OTHER): Payer: No Typology Code available for payment source | Admitting: Osteopathic Medicine

## 2019-07-22 VITALS — BP 133/74 | HR 95 | Ht <= 58 in | Wt 172.0 lb

## 2019-07-22 DIAGNOSIS — E119 Type 2 diabetes mellitus without complications: Secondary | ICD-10-CM

## 2019-07-22 DIAGNOSIS — E559 Vitamin D deficiency, unspecified: Secondary | ICD-10-CM

## 2019-07-22 DIAGNOSIS — R0602 Shortness of breath: Secondary | ICD-10-CM

## 2019-07-22 DIAGNOSIS — Z794 Long term (current) use of insulin: Secondary | ICD-10-CM | POA: Diagnosis not present

## 2019-07-22 DIAGNOSIS — R188 Other ascites: Secondary | ICD-10-CM

## 2019-07-22 DIAGNOSIS — Z1211 Encounter for screening for malignant neoplasm of colon: Secondary | ICD-10-CM

## 2019-07-22 DIAGNOSIS — R635 Abnormal weight gain: Secondary | ICD-10-CM

## 2019-07-22 DIAGNOSIS — D509 Iron deficiency anemia, unspecified: Secondary | ICD-10-CM | POA: Diagnosis not present

## 2019-07-22 LAB — CBC
HCT: 32.5 % — ABNORMAL LOW (ref 35.0–45.0)
Hemoglobin: 10.6 g/dL — ABNORMAL LOW (ref 11.7–15.5)
MCH: 27 pg (ref 27.0–33.0)
MCHC: 32.6 g/dL (ref 32.0–36.0)
MCV: 82.9 fL (ref 80.0–100.0)
MPV: 10.4 fL (ref 7.5–12.5)
Platelets: 145 10*3/uL (ref 140–400)
RBC: 3.92 10*6/uL (ref 3.80–5.10)
RDW: 14.7 % (ref 11.0–15.0)
WBC: 4.6 10*3/uL (ref 3.8–10.8)

## 2019-07-22 LAB — COMPLETE METABOLIC PANEL WITH GFR
AG Ratio: 1 (calc) (ref 1.0–2.5)
ALT: 14 U/L (ref 6–29)
AST: 32 U/L (ref 10–35)
Albumin: 3.5 g/dL — ABNORMAL LOW (ref 3.6–5.1)
Alkaline phosphatase (APISO): 101 U/L (ref 37–153)
BUN: 17 mg/dL (ref 7–25)
CO2: 20 mmol/L (ref 20–32)
Calcium: 9.4 mg/dL (ref 8.6–10.4)
Chloride: 106 mmol/L (ref 98–110)
Creat: 0.72 mg/dL (ref 0.50–1.05)
GFR, Est African American: 108 mL/min/{1.73_m2} (ref >=60)
GFR, Est Non African American: 94 mL/min/{1.73_m2} (ref >=60)
Globulin: 3.6 g/dL (calc) (ref 1.9–3.7)
Glucose, Bld: 79 mg/dL (ref 65–99)
Potassium: 5.1 mmol/L (ref 3.5–5.3)
Sodium: 138 mmol/L (ref 135–146)
Total Bilirubin: 1.6 mg/dL — ABNORMAL HIGH (ref 0.2–1.2)
Total Protein: 7.1 g/dL (ref 6.1–8.1)

## 2019-07-22 LAB — MAGNESIUM: Magnesium: 1.3 mg/dL — ABNORMAL LOW (ref 1.5–2.5)

## 2019-07-22 LAB — IRON,TIBC AND FERRITIN PANEL
%SAT: 13 % (calc) — ABNORMAL LOW (ref 16–45)
Ferritin: 70 ng/mL (ref 16–232)
Iron: 41 ug/dL — ABNORMAL LOW (ref 45–160)
TIBC: 324 mcg/dL (calc) (ref 250–450)

## 2019-07-22 LAB — POCT GLYCOSYLATED HEMOGLOBIN (HGB A1C): Hemoglobin A1C: 5.2 % (ref 4.0–5.6)

## 2019-07-22 LAB — VITAMIN D 25 HYDROXY (VIT D DEFICIENCY, FRACTURES): Vit D, 25-Hydroxy: 46 ng/mL (ref 30–100)

## 2019-07-22 NOTE — Progress Notes (Signed)
Meagan Peck is a 59 y.o. female who presents to  [place] at [facility]  today, 07/22/19, seeking care for the following: . Diabetes - chronic, improved . New issue - weight gain, LE edema, SOB     ASSESSMENT & PLAN with other pertinent history/findings:  The primary encounter diagnosis was Type 2 diabetes mellitus without complication, with long-term current use of insulin ([place]). Diagnoses of Hypomagnesemia, Iron deficiency anemia, unspecified iron deficiency anemia type, Vitamin D deficiency, Colon cancer screening, Abnormal weight gain, Other ascites, and SOBOE (shortness of breath on exertion) were also pertinent to this visit.  Results for orders placed or performed in visit on 07/22/19 (from the past 24 hour(s))  POCT HgB A1C     Status: Normal   Collection Time: 07/22/19 10:34 AM  Result Value Ref Range   Hemoglobin A1C 5.2 4.0 - 5.6 %   HbA1c POC (<> result, manual entry)     HbA1c, POC (prediabetic range)     HbA1c, POC (controlled diabetic range)     DM2 better off insulin - A1C steady at 5.2, she was on quite low dose insulin before we stopped it last visit.   New issues: weight gain, SOB, swelling ongoing and getting worse a few months.  New physical exam findings: LE edema +2 ankles and +1 closer to knees skin otherwise normal, CV (+)8/3 systolic murmur NO gallop and normal S1S2 NO JVD, Lungs CTAB no rales, distended abdomen w/ fluid wave   Wt Readings from Last 3 Encounters:  07/22/19 172 lb (78 kg)  04/02/19 156 lb 1.9 oz (70.8 kg)  01/01/19 149 lb (67.6 kg)       Orders Placed This Encounter  Procedures  . US Abdomen Complete  . CBC  . COMPLETE METABOLIC PANEL WITH GFR  . VITAMIN D 25 Hydroxy (Vit-D Deficiency, Fractures)  . Magnesium  . Fe+TIBC+Fer  . Cologuard  . TSH + free T4  . B Nat Peptide  . Urinalysis, Routine w reflex microscopic  . Microalbumin / creatinine urine ratio  . Hepatitis C antibody   . POCT HgB A1C  . ECHOCARDIOGRAM COMPLETE      Follow-up instructions: Return for RECHECK PENDING RESULTS / IF WORSE OR CHANGE.                                         BP 133/74   Pulse 95   Ht 4' 10"  (1.473 m)   Wt 172 lb (78 kg)   LMP 12/03/2013   BMI 35.95 kg/m   No outpatient medications have been marked as taking for the 07/22/19 encounter (Office Visit) with [name], DO.    No results found for this or any previous visit (from the past 72 hour(s)).  No results found.      All questions at time of visit were answered - patient instructed to contact office with any additional concerns or updates.  ER/RTC precautions were reviewed with the patient.  Please note: voice recognition software was used to produce this document, and typos may escape review. Please contact Dr. Sheppard Coil for any needed clarifications.

## 2019-07-23 LAB — BRAIN NATRIURETIC PEPTIDE: Brain Natriuretic Peptide: 43 pg/mL (ref <100)

## 2019-07-23 LAB — HEPATITIS C ANTIBODY
Hepatitis C Ab: NONREACTIVE
SIGNAL TO CUT-OFF: 0.03 (ref <1.00)

## 2019-07-23 LAB — TSH+FREE T4: TSH W/REFLEX TO FT4: 3.65 mIU/L (ref 0.40–4.50)

## 2019-07-25 ENCOUNTER — Ambulatory Visit (HOSPITAL_BASED_OUTPATIENT_CLINIC_OR_DEPARTMENT_OTHER)
Admission: RE | Admit: 2019-07-25 | Discharge: 2019-07-25 | Disposition: A | Discharge status: Home / Self Care | Payer: No Typology Code available for payment source | Source: Ambulatory Visit | Attending: Osteopathic Medicine | Admitting: Osteopathic Medicine

## 2019-07-25 ENCOUNTER — Other Ambulatory Visit: Payer: Self-pay

## 2019-07-25 DIAGNOSIS — R635 Abnormal weight gain: Secondary | ICD-10-CM | POA: Diagnosis present

## 2019-07-25 DIAGNOSIS — Z1211 Encounter for screening for malignant neoplasm of colon: Secondary | ICD-10-CM | POA: Diagnosis present

## 2019-07-25 DIAGNOSIS — D509 Iron deficiency anemia, unspecified: Secondary | ICD-10-CM | POA: Insufficient documentation

## 2019-07-25 DIAGNOSIS — Z794 Long term (current) use of insulin: Secondary | ICD-10-CM | POA: Insufficient documentation

## 2019-07-25 DIAGNOSIS — R0602 Shortness of breath: Secondary | ICD-10-CM | POA: Diagnosis not present

## 2019-07-25 DIAGNOSIS — R188 Other ascites: Secondary | ICD-10-CM | POA: Diagnosis present

## 2019-07-25 DIAGNOSIS — E119 Type 2 diabetes mellitus without complications: Secondary | ICD-10-CM | POA: Insufficient documentation

## 2019-07-25 DIAGNOSIS — E559 Vitamin D deficiency, unspecified: Secondary | ICD-10-CM | POA: Diagnosis present

## 2019-07-25 NOTE — Progress Notes (Signed)
  Echocardiogram 2D Echocardiogram has been performed.  Meagan Peck 07/25/2019, 11:33 AM

## 2019-07-26 ENCOUNTER — Ambulatory Visit (INDEPENDENT_AMBULATORY_CARE_PROVIDER_SITE_OTHER): Payer: No Typology Code available for payment source

## 2019-07-26 DIAGNOSIS — E559 Vitamin D deficiency, unspecified: Secondary | ICD-10-CM | POA: Diagnosis not present

## 2019-07-26 DIAGNOSIS — R635 Abnormal weight gain: Secondary | ICD-10-CM

## 2019-07-26 DIAGNOSIS — Z1211 Encounter for screening for malignant neoplasm of colon: Secondary | ICD-10-CM

## 2019-07-26 DIAGNOSIS — D509 Iron deficiency anemia, unspecified: Secondary | ICD-10-CM | POA: Diagnosis not present

## 2019-07-26 DIAGNOSIS — E119 Type 2 diabetes mellitus without complications: Secondary | ICD-10-CM

## 2019-07-26 DIAGNOSIS — R188 Other ascites: Secondary | ICD-10-CM

## 2019-07-26 DIAGNOSIS — Z794 Long term (current) use of insulin: Secondary | ICD-10-CM

## 2019-07-26 DIAGNOSIS — R0602 Shortness of breath: Secondary | ICD-10-CM

## 2019-07-29 ENCOUNTER — Encounter: Payer: Self-pay | Admitting: Osteopathic Medicine

## 2019-07-29 DIAGNOSIS — R932 Abnormal findings on diagnostic imaging of liver and biliary tract: Secondary | ICD-10-CM

## 2019-07-29 DIAGNOSIS — R188 Other ascites: Secondary | ICD-10-CM

## 2019-07-29 NOTE — Telephone Encounter (Signed)
Order placed for GI

## 2019-07-29 NOTE — Telephone Encounter (Signed)
Referral pended as urgent. Please review and sign if OK

## 2019-07-31 ENCOUNTER — Ambulatory Visit (INDEPENDENT_AMBULATORY_CARE_PROVIDER_SITE_OTHER): Payer: No Typology Code available for payment source | Admitting: Physician Assistant

## 2019-07-31 ENCOUNTER — Other Ambulatory Visit: Payer: Self-pay

## 2019-07-31 ENCOUNTER — Telehealth: Payer: Self-pay

## 2019-07-31 ENCOUNTER — Other Ambulatory Visit (INDEPENDENT_AMBULATORY_CARE_PROVIDER_SITE_OTHER): Payer: No Typology Code available for payment source

## 2019-07-31 ENCOUNTER — Encounter: Payer: Self-pay | Admitting: Physician Assistant

## 2019-07-31 VITALS — BP 102/60 | HR 92 | Temp 98.5°F | Ht <= 58 in | Wt 172.1 lb

## 2019-07-31 DIAGNOSIS — Z1211 Encounter for screening for malignant neoplasm of colon: Secondary | ICD-10-CM | POA: Diagnosis not present

## 2019-07-31 DIAGNOSIS — K7460 Unspecified cirrhosis of liver: Secondary | ICD-10-CM

## 2019-07-31 DIAGNOSIS — R188 Other ascites: Secondary | ICD-10-CM

## 2019-07-31 DIAGNOSIS — Z1212 Encounter for screening for malignant neoplasm of rectum: Secondary | ICD-10-CM

## 2019-07-31 LAB — HEPATIC FUNCTION PANEL
ALT: 13 U/L (ref 0–35)
AST: 29 U/L (ref 0–37)
Albumin: 3.3 g/dL — ABNORMAL LOW (ref 3.5–5.2)
Alkaline Phosphatase: 90 U/L (ref 39–117)
Bilirubin, Direct: 0.5 mg/dL — ABNORMAL HIGH (ref 0.0–0.3)
Total Bilirubin: 1.5 mg/dL — ABNORMAL HIGH (ref 0.2–1.2)
Total Protein: 7 g/dL (ref 6.0–8.3)

## 2019-07-31 LAB — PROTIME-INR
INR: 1.4 ratio — ABNORMAL HIGH (ref 0.8–1.0)
Prothrombin Time: 15.2 s — ABNORMAL HIGH (ref 9.6–13.1)

## 2019-07-31 MED ORDER — METOCLOPRAMIDE HCL 10 MG PO TABS: ORAL_TABLET | ORAL | 0 refills | Status: DC

## 2019-07-31 MED ORDER — FUROSEMIDE 40 MG PO TABS: 40.0000 mg | ORAL_TABLET | Freq: Every day | ORAL | 5 refills | Status: DC

## 2019-07-31 MED ORDER — SPIRONOLACTONE 50 MG PO TABS: 50.0000 mg | ORAL_TABLET | Freq: Every day | ORAL | 5 refills | Status: DC

## 2019-07-31 MED FILL — SPIRONOLACTONE 50 MG TABS: 50 | 30 days supply | Qty: 30 | Fill #0

## 2019-07-31 MED FILL — FUROSEMIDE 40 MG TAB: 40 | 30 days supply | Qty: 30 | Fill #0

## 2019-07-31 MED FILL — METOCLOPRAMIDE 10 MG TABLET: 10 | 2 days supply | Qty: 2 | Fill #0

## 2019-07-31 NOTE — Patient Instructions (Addendum)
If you are age 57 or older, your body mass index should be between 23-30. Your Body mass index is 36.29 kg/m. If this is out of the aforementioned range listed, please consider follow up with your Primary Care Provider.  If you are age 51 or younger, your body mass index should be between 19-25. Your Body mass index is 36.29 kg/m. If this is out of the aformentioned range listed, please consider follow up with your Primary Care Provider.   Your provider has requested that you go to the basement level for lab work before leaving today. Press "B" on the elevator. The lab is located at the first door on the left as you exit the elevator.   We have sent the following medications to your pharmacy for you to pick up at your convenience: Spironolactone 50 mg daily. Lasix 40 mg daily. Reglan 10 mg one 30 minutes before each prep.   Less than 2G of sodium daily.   Check weight DAILY!!!   You have been scheduled for an abdominal paracentesis at [name] radiology (1st floor of hospital) on Thursday 08/01/19 at 8:45 am. Please arrive at least 15 minutes prior to your appointment time for registration. Should you need to reschedule this appointment for any reason, please call our office at [phone]. Hold Metformin the morning of procedure.    Return in 1 week on 08/04/19 for lab test only.

## 2019-07-31 NOTE — Telephone Encounter (Signed)
-----   Message from Levin Erp, [place] sent at 07/31/2019  3:32 PM EDT ----- Regarding: labs Can you add on total IGG and albumin to fluid analysis as per Dr. Serita Butcher.  Thanks-JLL ----- Message ----- From: Yetta Flock, MD Sent: 07/31/2019   2:23 PM EDT To: Levin Erp, PA    ----- Message ----- From: Levin Erp, [place] Sent: 07/31/2019  12:10 PM EDT To: Yetta Flock, MD

## 2019-07-31 NOTE — Progress Notes (Signed)
Agree with assessment and plan as outlined. Will workup for chronic liver diseases and iron deficiency anemia. Anderson Malta please make sure they add on albumin to peritoneal fluid so we can calculate SAAG. I would also add on total IgG level as part of autoimmune workup in regards to serologies. Thanks

## 2019-07-31 NOTE — Telephone Encounter (Signed)
IGG has been added.  Called [facility] and confirmed test add on  received.  Also, called and have albumin added to paracentesis.

## 2019-07-31 NOTE — Progress Notes (Signed)
Chief Complaint: Abnormal liver ultrasound, ascites  HPI:    Meagan Peck is a 59 year old female with a past medical history as listed below including diabetes and reflux, who was referred to me by Hali Marry, * for a complaint of abnormal liver ultrasound, ascites.      07/22/2019 CMP with albumin low at 3.5 and bilirubin minimally elevated at 1.6, LFTs otherwise normal.  CBC with a hemoglobin of 10.6 (similar to 10 months ago).  Hepatitis C antibody nonreactive.  Iron studies with an iron low at 41, percent saturation low at 13.  This is improved from 10 months ago.    07/25/2019 cardiac echo with LVEF 60-65%.  Mild to moderate posterior annular calcification with mild mitral valve regurgitation.    07/26/2019 abdominal ultrasound completed for ascites and weight gain with a history of cholecystectomy.  Liver was found to be nodular in contour suggestive of cirrhosis with a large amount of perihepatic ascites, liver parenchyma was mildly heterogenous without a discrete lesion.  Portal vein patent on color Doppler imaging.  Splenomegaly.  Large volume of ascites suggestive for portal hypertension.      Today, the patient tells me that in January she started gaining weight and experiencing abdominal distention as well as swelling in her legs.  Since then it has just increased and she now has a large abdomen which feels like she is pregnant associated with back pain and trouble sleeping.  Also experiencing a decrease in appetite since that time but denies nausea or vomiting.  Tells me her stools typically loose.  Also describes an umbilical hernia which is worse with all of her abdominal distention.  The swelling in her legs has reached the level of her knees.  Denies any history of alcohol use.     Denies fever, chills, change in bowel habits, blood in her stool, abdominal pain, history of IV drug use, history of hepatitis, family history of liver disease or use of herbal supplements or  excessive use of Tylenol.  Past Medical History:  Diagnosis Date  . Diabetes mellitus without complication ([place])   . GERD (gastroesophageal reflux disease)   . Hypertension   . Iron deficiency anemia 09/26/2018    Past Surgical History:  Procedure Laterality Date  . CARPAL TUNNEL RELEASE     right hand  . CESAREAN SECTION    . CHOLECYSTECTOMY    . KNEE ARTHROSCOPY Right 12/10/2015   Procedure: RIGHT KNEE ARTHROSCOPY WITH PARTIAL MEDIAL MENISCECTOMY;  Surgeon: Mcarthur Rossetti, MD;  Location: [facility];  Service: Orthopedics;  Laterality: Right;  . KNEE ARTHROSCOPY W/ MENISCAL REPAIR     left knee    Current Outpatient Medications  Medication Sig Dispense Refill  . ACCU-CHEK FASTCLIX LANCETS MISC Check fsbs TID    . aspirin 81 MG tablet Take 81 mg by mouth daily.    . blood glucose meter kit and supplies KIT Dispense based on patient and insurance preference. Use up to four times daily as directed. Please include lancets, test strips, control solution. 1 each 99  . cetirizine (ZYRTEC) 10 MG tablet Take 1 tablet (10 mg total) by mouth daily. 90 tablet 3  . Cholecalciferol (VITAMIN D3) 5000 UNITS TABS Take 5,000 Units by mouth daily.     . diclofenac sodium (VOLTAREN) 1 % GEL Apply 4 g topically 4 (four) times daily. To affected joint. 100 g 11  . escitalopram (LEXAPRO) 10 MG tablet TAKE 1 TABLET (10 MG TOTAL) BY MOUTH EVERY EVENING.  90 tablet 1  . FeFum-FePoly-FA-B Cmp-C-Biot (FOLIVANE-PLUS) CAPS Take 1 capsule by mouth daily.  5  . ferrous sulfate 325 (65 FE) MG EC tablet Take 1 tablet (325 mg total) by mouth 2 (two) times daily with a meal. 180 tablet 1  . glucose blood (ACCU-CHEK GUIDE) test strip Use to check blood sugar 3 times daily. Dx:E08.00 100 each 12  . ibuprofen (ADVIL,MOTRIN) 800 MG tablet Take 1 tablet (800 mg total) by mouth every 8 (eight) hours as needed for moderate pain. 60 tablet 0  . Insulin Glargine (LANTUS SOLOSTAR) 100 UNIT/ML Solostar Pen Inject 12-14 Units  into the skin daily. Per protocol 15 mL 3  . Insulin Pen Needle (ULTICARE SHORT PEN NEEDLES) 31G X 8 MM MISC USE AS DIRECTED TO INJECT LANTUS AND BYETTA 100 each 12  . Krill Oil 500 MG CAPS Take 500 mg by mouth daily.    Marland Kitchen lisinopril (ZESTRIL) 40 MG tablet TAKE 1 TABLET (40 MG TOTAL) BY MOUTH DAILY. 90 tablet 1  . loperamide (IMODIUM) 2 MG capsule Take 1 capsule (2 mg total) by mouth as needed for diarrhea or loose stools (30 minutes prior to meals). 30 capsule 0  . magnesium oxide (MAG-OX) 400 MG tablet Take 1 tablet (400 mg total) by mouth 2 (two) times daily. 180 tablet 3  . metFORMIN (GLUCOPHAGE) 1000 MG tablet TAKE 1 TABLET (1,000 MG TOTAL) BY MOUTH 2 (TWO) TIMES DAILY WITH A MEAL. 90 tablet 1  . rosuvastatin (CRESTOR) 20 MG tablet Take 1 tablet (20 mg total) by mouth 4 (four) times a week. 90 tablet 1  . Semaglutide,0.25 or 0.5MG/DOS, (OZEMPIC, 0.25 OR 0.5 MG/DOSE,) 2 MG/1.5ML SOPN Inject 0.5 mg into the skin once a week. 4 pen 5   No current facility-administered medications for this visit.    Allergies as of 07/31/2019 - Review Complete 07/22/2019  Allergen Reaction Noted  . Penicillins Anaphylaxis 02/01/2013  . Atrovent [ipratropium] Palpitations 08/11/2017  . Naproxen Rash 02/01/2013  . Quinine derivatives Rash 04/14/2014  . Sulfa antibiotics Rash 02/01/2013    Family History  Problem Relation Age of Onset  . Hypertension Mother   . COPD Mother   . Alcoholism Father   . Heart attack Father   . Diabetes Brother   . Diabetes Paternal Grandmother   . Alcoholism Paternal Uncle   . Alcoholism Maternal Uncle     Social History   Socioeconomic History  . Marital status: Married    Spouse name: Coby Antrobus  . Number of children: 1  . Years of education: Not on file  . Highest education level: Associate degree: academic program  Occupational History  . Not on file  Tobacco Use  . Smoking status: Never Smoker  . Smokeless tobacco: Never Used  Substance and Sexual  Activity  . Alcohol use: No  . Drug use: No  . Sexual activity: Yes    Partners: Male    Birth control/protection: None  Other Topics Concern  . Not on file  Social History Narrative  . Not on file   Social Determinants of Health   Financial Resource Strain:   . Difficulty of Paying Living Expenses:   Food Insecurity:   . Worried About Charity fundraiser in the Last Year:   . Arboriculturist in the Last Year:   Transportation Needs:   . Film/video editor (Medical):   Marland Kitchen Lack of Transportation (Non-Medical):   Physical Activity:   . Days of Exercise  per Week:   . Minutes of Exercise per Session:   Stress:   . Feeling of Stress :   Social Connections:   . Frequency of Communication with Friends and Family:   . Frequency of Social Gatherings with Friends and Family:   . Attends Religious Services:   . Active Member of Clubs or Organizations:   . Attends Archivist Meetings:   Marland Kitchen Marital Status:   Intimate Partner Violence:   . Fear of Current or Ex-Partner:   . Emotionally Abused:   Marland Kitchen Physically Abused:   . Sexually Abused:     Review of Systems:    Constitutional: No weight loss, fever or chills Skin: No rash  Cardiovascular: No chest pain  Respiratory: No SOB  Gastrointestinal: See HPI and otherwise negative Genitourinary: No dysuria  Neurological: No headache Musculoskeletal: No new muscle or joint pain Hematologic: No bleeding Psychiatric: No history of depression or anxiety   Physical Exam:  Vital signs: BP 102/60 (BP Location: Left Arm, Patient Position: Sitting, Cuff Size: Normal)   Pulse 92   Temp 98.5 F (36.9 C)   Ht 4' 9.75" (1.467 m) Comment: height measured without shoes  Wt 172 lb 2 oz (78.1 kg)   LMP 12/03/2013   BMI 36.29 kg/m   Constitutional:   Pleasant Caucasian female appears to be in NAD, Well developed, Well nourished, alert and cooperative Head:  Normocephalic and atraumatic. Eyes:   PEERL, EOMI. No icterus.  Conjunctiva pink. Ears:  Normal auditory acuity. Neck:  Supple Throat: Oral cavity and pharynx without inflammation, swelling or lesion.  Respiratory: Respirations even and unlabored. Lungs clear to auscultation bilaterally.   No wheezes, crackles, or rhonchi.  Cardiovascular: Normal S1, S2. No MRG. Regular rate and rhythm. No peripheral edema, cyanosis or pallor.  Gastrointestinal:  Tense, marked abdominal distension, +fluid wave, no ttp. No rebound or guarding. Normal bowel sounds. No appreciable masses or hepatomegaly. Rectal:  Not performed.  Msk:  Symmetrical without gross deformities. Without edema, no deformity or joint abnormality.  Neurologic:  Alert and  oriented x4;  grossly normal neurologically.  Skin:   Dry and intact without significant lesions or rashes. Psychiatric: Demonstrates good judgement and reason without abnormal affect or behaviors.  RELEVANT LABS AND IMAGING: CBC    Component Value Date/Time   WBC 4.6 07/22/2019 1130   RBC 3.92 07/22/2019 1130   HGB 10.6 (L) 07/22/2019 1130   HCT 32.5 (L) 07/22/2019 1130   PLT 145 07/22/2019 1130   MCV 82.9 07/22/2019 1130   MCH 27.0 07/22/2019 1130   MCHC 32.6 07/22/2019 1130   RDW 14.7 07/22/2019 1130   LYMPHSABS 1,105 09/25/2018 1106   EOSABS 211 09/25/2018 1106   BASOSABS 39 09/25/2018 1106    CMP     Component Value Date/Time   NA 138 07/22/2019 1130   K 5.1 07/22/2019 1130   CL 106 07/22/2019 1130   CO2 20 07/22/2019 1130   GLUCOSE 79 07/22/2019 1130   BUN 17 07/22/2019 1130   CREATININE 0.72 07/22/2019 1130   CALCIUM 9.4 07/22/2019 1130   PROT 7.1 07/22/2019 1130   AST 32 07/22/2019 1130   ALT 14 07/22/2019 1130   BILITOT 1.6 (H) 07/22/2019 1130   GFRNONAA 94 07/22/2019 1130   GFRAA 108 07/22/2019 1130    Assessment: 1.  Cirrhosis with ascites: Recent ultrasound with nodular liver and ascites suggesting portal hypertension, no alcohol use; consider most likely nonalcoholic fatty liver disease versus  autoimmune versus other  2.  Screening for colorectal cancer: Patient is 14 and has not had a colonoscopy  Plan: 1.  Ordered labs today including PT/INR, ANA, ASMA, alpha-1 antitrypsin, AMA, ceruloplasmin, hepatitis studies and AFP.  Also ordered repeat CMP for 1 week from now. 2.  Started the patient on Spironolactone 50 mg daily #30 with 5 refills 3.  Started the patient on Lasix 40 mg daily #30 with 5 refills 4.  Scheduled the patient for an EGD for variceal screening as well as a colorectal cancer screening with colonoscopy in the [place] with Dr. Havery Moros as he had availability.  Did discuss risks, benefits, limitations and alternatives and the patient agrees to proceed.  Patient will be Covid tested prior to time of procedure.  Prescribed Reglan 10 mg to be taken 20 to 30 minutes before each bowel prep #2 with no refills. 5.  Scheduled patient for an ultrasound guided therapeutic and diagnostic paracentesis with fluid for cytology, cell count and differential, Gram stain, culture and protein level.  Ordered 25 g of albumin to be given for every liter drawn off over 5 L. 6.  Advised the patient to maintain a less than 2 g sodium diet.  Provided her with information in regards to this. 7.  Recommended patient record daily weights. 8.  Recommend the patient avoid hepatotoxic toxic substances including herbs, large amounts of Tylenol, alcohol or other. 9.  Patient will need to follow in clinic shortly after her procedures to ensure that her ascites is controlled and that she is aware of other cirrhotic management recommendations. 10.  Dr. Havery Moros will be the patient's primary GI physician.  Ellouise Newer, PA-C [facility] 07/31/2019, 10:47 AM  Cc: Hali Marry, *

## 2019-08-01 ENCOUNTER — Ambulatory Visit (HOSPITAL_COMMUNITY)
Admission: RE | Admit: 2019-08-01 | Discharge: 2019-08-01 | Disposition: A | Discharge status: Home / Self Care | Payer: No Typology Code available for payment source | Source: Ambulatory Visit | Attending: Physician Assistant | Admitting: Physician Assistant

## 2019-08-01 ENCOUNTER — Other Ambulatory Visit: Payer: Self-pay

## 2019-08-01 DIAGNOSIS — R188 Other ascites: Secondary | ICD-10-CM | POA: Diagnosis not present

## 2019-08-01 DIAGNOSIS — K7460 Unspecified cirrhosis of liver: Secondary | ICD-10-CM | POA: Insufficient documentation

## 2019-08-01 HISTORY — PX: IR PARACENTESIS: IMG2679

## 2019-08-01 HISTORY — PX: COLONOSCOPY: SHX174

## 2019-08-01 LAB — PROTEIN, PLEURAL OR PERITONEAL FLUID: Total protein, fluid: <3 g/dL

## 2019-08-01 LAB — BODY FLUID CELL COUNT WITH DIFFERENTIAL
Eos, Fluid: 0 %
Lymphs, Fluid: 24 %
Monocyte-Macrophage-Serous Fluid: 72 % (ref 50–90)
Neutrophil Count, Fluid: 4 % (ref 0–25)
Total Nucleated Cell Count, Fluid: 61 cu mm (ref 0–1000)

## 2019-08-01 LAB — GRAM STAIN

## 2019-08-01 LAB — ALBUMIN, PLEURAL OR PERITONEAL FLUID: Albumin, Fluid: <1 g/dL

## 2019-08-01 MED ORDER — LIDOCAINE HCL 1 % IJ SOLN
INTRAMUSCULAR | Status: AC
  Filled 2019-08-01: qty 20

## 2019-08-01 MED ORDER — ALBUMIN HUMAN 25 % IV SOLN
INTRAVENOUS | Status: AC
  Filled 2019-08-01: qty 100

## 2019-08-01 MED ORDER — ALBUMIN HUMAN 25 % IV SOLN
INTRAVENOUS | Status: AC
  Filled 2019-08-01: qty 200

## 2019-08-01 MED ORDER — ALBUMIN HUMAN 25 % IV SOLN
50.0000 g | Freq: Once | INTRAVENOUS | Status: AC
  Administered 2019-08-01: 50 g via INTRAVENOUS

## 2019-08-01 MED ORDER — ALBUMIN HUMAN 25 % IV SOLN
25.0000 g | Freq: Once | INTRAVENOUS | Status: AC
  Administered 2019-08-01: 25 g via INTRAVENOUS

## 2019-08-01 MED ORDER — LIDOCAINE HCL 1 % IJ SOLN
INTRAMUSCULAR | Status: AC | PRN
  Administered 2019-08-01: 10 mL

## 2019-08-01 NOTE — Procedures (Signed)
Ultrasound-guided diagnostic and therapeutic paracentesis performed yielding 10 liters of yellow fluid. No immediate complications.  A portion of the fluid was submitted to the lab for preordered studies.  Patient will receive IV albumin post procedure. EBL none.

## 2019-08-02 LAB — CYTOLOGY - NON PAP

## 2019-08-05 LAB — ANTI-NUCLEAR AB-TITER (ANA TITER): ANA Titer 1: 1:80 {titer} — ABNORMAL HIGH

## 2019-08-05 LAB — TEST AUTHORIZATION

## 2019-08-05 LAB — HEPATITIS B SURFACE ANTIGEN: Hepatitis B Surface Ag: NONREACTIVE

## 2019-08-05 LAB — ANA: Anti Nuclear Antibody (ANA): POSITIVE — AB

## 2019-08-05 LAB — HEPATITIS B SURFACE ANTIBODY,QUALITATIVE: Hep B S Ab: NONREACTIVE

## 2019-08-05 LAB — HEPATITIS A ANTIBODY, TOTAL: Hepatitis A AB,Total: NONREACTIVE

## 2019-08-05 LAB — IGG: IgG (Immunoglobin G), Serum: 1618 mg/dL (ref 600–1640)

## 2019-08-05 LAB — MITOCHONDRIAL ANTIBODIES: Mitochondrial M2 Ab, IgG: <=20 U

## 2019-08-05 LAB — CERULOPLASMIN: Ceruloplasmin: 35 mg/dL (ref 18–53)

## 2019-08-05 LAB — AFP TUMOR MARKER: AFP-Tumor Marker: 2.3 ng/mL

## 2019-08-05 LAB — ALPHA-1-ANTITRYPSIN: A-1 Antitrypsin, Ser: 221 mg/dL — ABNORMAL HIGH (ref 83–199)

## 2019-08-05 LAB — ANTI-SMOOTH MUSCLE ANTIBODY, IGG: Actin (Smooth Muscle) Antibody (IGG): <20 U (ref <20)

## 2019-08-06 LAB — CULTURE, BODY FLUID W GRAM STAIN -BOTTLE: Culture: NO GROWTH

## 2019-08-08 ENCOUNTER — Other Ambulatory Visit: Payer: Self-pay | Admitting: Osteopathic Medicine

## 2019-08-08 MED FILL — MAGNESIUM OXIDE 400 MG TAB: 400 (240 MG | 60 days supply | Qty: 120 | Fill #2

## 2019-08-08 MED FILL — METFORMIN HCL 1000 MG TABS: 1000 | 45 days supply | Qty: 90 | Fill #1

## 2019-08-09 ENCOUNTER — Ambulatory Visit (INDEPENDENT_AMBULATORY_CARE_PROVIDER_SITE_OTHER): Payer: No Typology Code available for payment source

## 2019-08-09 ENCOUNTER — Other Ambulatory Visit: Payer: Self-pay | Admitting: Gastroenterology

## 2019-08-09 DIAGNOSIS — Z1159 Encounter for screening for other viral diseases: Secondary | ICD-10-CM

## 2019-08-09 MED ORDER — NA SULFATE-K SULFATE-MG SULF 17.5-3.13-1.6 GM/177ML PO SOLN: 1.0000 | Freq: Once | ORAL | 0 refills | Status: AC

## 2019-08-09 MED FILL — SUPREP BOWEL PREP KIT: 17.5-3.13-1 | 1 days supply | Qty: 354 | Fill #0

## 2019-08-10 LAB — SARS CORONAVIRUS 2 (TAT 6-24 HRS): SARS Coronavirus 2: NEGATIVE

## 2019-08-13 ENCOUNTER — Encounter: Payer: Self-pay | Admitting: Gastroenterology

## 2019-08-13 ENCOUNTER — Telehealth: Payer: Self-pay | Admitting: Gastroenterology

## 2019-08-13 ENCOUNTER — Ambulatory Visit (AMBULATORY_SURGERY_CENTER): Payer: No Typology Code available for payment source | Admitting: Gastroenterology

## 2019-08-13 ENCOUNTER — Other Ambulatory Visit: Payer: Self-pay

## 2019-08-13 ENCOUNTER — Other Ambulatory Visit (INDEPENDENT_AMBULATORY_CARE_PROVIDER_SITE_OTHER): Payer: No Typology Code available for payment source

## 2019-08-13 VITALS — BP 105/60 | HR 88 | Temp 96.6°F | Resp 20 | Ht <= 58 in | Wt 172.0 lb

## 2019-08-13 DIAGNOSIS — D509 Iron deficiency anemia, unspecified: Secondary | ICD-10-CM

## 2019-08-13 DIAGNOSIS — K766 Portal hypertension: Secondary | ICD-10-CM

## 2019-08-13 DIAGNOSIS — K648 Other hemorrhoids: Secondary | ICD-10-CM | POA: Diagnosis not present

## 2019-08-13 DIAGNOSIS — K7460 Unspecified cirrhosis of liver: Secondary | ICD-10-CM

## 2019-08-13 DIAGNOSIS — K5520 Angiodysplasia of colon without hemorrhage: Secondary | ICD-10-CM

## 2019-08-13 DIAGNOSIS — K2950 Unspecified chronic gastritis without bleeding: Secondary | ICD-10-CM

## 2019-08-13 DIAGNOSIS — R188 Other ascites: Secondary | ICD-10-CM

## 2019-08-13 DIAGNOSIS — K3189 Other diseases of stomach and duodenum: Secondary | ICD-10-CM

## 2019-08-13 LAB — BASIC METABOLIC PANEL
BUN: 23 mg/dL (ref 6–23)
CO2: 16 mEq/L — ABNORMAL LOW (ref 19–32)
Calcium: 8.6 mg/dL (ref 8.4–10.5)
Chloride: 103 mEq/L (ref 96–112)
Creatinine, Ser: 0.88 mg/dL (ref 0.40–1.20)
GFR: 66.31 mL/min (ref >60.00)
Glucose, Bld: 70 mg/dL (ref 70–99)
Potassium: 5.2 mEq/L — ABNORMAL HIGH (ref 3.5–5.1)
Sodium: 126 mEq/L — ABNORMAL LOW (ref 135–145)

## 2019-08-13 MED ORDER — DEXTROSE 5 % IV SOLN: INTRAVENOUS | Status: DC

## 2019-08-13 MED ORDER — SODIUM CHLORIDE 0.9 % IV SOLN: 500.0000 mL | INTRAVENOUS | Status: DC

## 2019-08-13 MED FILL — OZEMPIC 0.25 OR 0.5 MG/DOSE: 2 | 28 days supply | Qty: 2 | Fill #0

## 2019-08-13 NOTE — Telephone Encounter (Signed)
See lab results for details.

## 2019-08-13 NOTE — Op Note (Signed)
[place] Patient Name: Meagan Peck Procedure Date: 08/13/2019 9:35 AM MRN: [id number] Endoscopist: Remo Lipps P. Havery Moros , MD Age: 59 Referring MD:  Date of Birth: 1960/05/24 Gender: Female Account #: [id number] Procedure:                Colonoscopy Indications:              This is the patient's first colonoscopy, Iron                            deficiency anemia Medicines:                Monitored Anesthesia Care Procedure:                Pre-Anesthesia Assessment:                           - Prior to the procedure, a History and Physical                            was performed, and patient medications and                            allergies were reviewed. The patient's tolerance of                            previous anesthesia was also reviewed. The risks                            and benefits of the procedure and the sedation                            options and risks were discussed with the patient.                            All questions were answered, and informed consent                            was obtained. Prior Anticoagulants: The patient has                            taken no previous anticoagulant or antiplatelet                            agents. ASA Grade Assessment: III - A patient with                            severe systemic disease. After reviewing the risks                            and benefits, the patient was deemed in                            satisfactory condition to undergo the procedure.  After obtaining informed consent, the colonoscope                            was passed under direct vision. Throughout the                            procedure, the patient's blood pressure, pulse, and                            oxygen saturations were monitored continuously. The                            Colonoscope was introduced through the anus and                            advanced to the the terminal ileum,  with                            identification of the appendiceal orifice and IC                            valve. The colonoscopy was performed without                            difficulty. The patient tolerated the procedure                            well. The quality of the bowel preparation was good. Scope In: 9:50:46 AM Scope Out: 10:00:16 AM Scope Withdrawal Time: 0 hours 7 minutes 28 seconds  Total Procedure Duration: 0 hours 9 minutes 30 seconds  Findings:                 The perianal and digital rectal examinations were                            normal.                           The terminal ileum appeared normal.                           A few small angiodysplastic lesions without                            bleeding were found in the sigmoid colon, in the                            descending colon and at the ileocecal valve.                           Internal hemorrhoids were found during                            retroflexion. The hemorrhoids were small.  The exam was otherwise without abnormality. Complications:            No immediate complications. Estimated blood loss:                            None. Estimated Blood Loss:     Estimated blood loss: none. Impression:               - The examined portion of the ileum was normal.                           - A few small benign appearing non-bleeding colonic                            angiodysplastic lesion.                           - Internal hemorrhoids.                           - The examination was otherwise normal.                           - No polyps                           AVMs could be contributing to anemia, but suspect                            more so related to portal hypertensive gastritis on                            EGD. Recommendation:           - Patient has a contact number available for                            emergencies. The signs and symptoms of potential                             delayed complications were discussed with the                            patient. Return to normal activities tomorrow.                            Written discharge instructions were provided to the                            patient.                           - Resume previous diet.                           - Continue present medications.                           -  Repeat colonoscopy in 10 years for screening                            purposes. Remo Lipps P. Charlann Wayne, MD 08/13/2019 10:13:42 AM This report has been signed electronically.

## 2019-08-13 NOTE — Op Note (Signed)
[place] Patient Name: Meagan Peck Procedure Date: 08/13/2019 9:35 AM MRN: [id number] Endoscopist: Remo Lipps P. Havery Moros , MD Age: 59 Referring MD:  Date of Birth: 1960/09/21 Gender: Female Account #: [id number] Procedure:                Upper GI endoscopy Indications:              Iron deficiency anemia, Cirrhosis of unclear                            etiology (fatty liver?) rule out esophageal varices Medicines:                Monitored Anesthesia Care Procedure:                Pre-Anesthesia Assessment:                           - Prior to the procedure, a History and Physical                            was performed, and patient medications and                            allergies were reviewed. The patient's tolerance of                            previous anesthesia was also reviewed. The risks                            and benefits of the procedure and the sedation                            options and risks were discussed with the patient.                            All questions were answered, and informed consent                            was obtained. Prior Anticoagulants: The patient has                            taken no previous anticoagulant or antiplatelet                            agents. ASA Grade Assessment: III - A patient with                            severe systemic disease. After reviewing the risks                            and benefits, the patient was deemed in                            satisfactory condition to undergo the procedure.  After obtaining informed consent, the endoscope was                            passed under direct vision. Throughout the                            procedure, the patient's blood pressure, pulse, and                            oxygen saturations were monitored continuously. The                            Endoscope was introduced through the mouth, and   advanced to the second part of duodenum. The upper                            GI endoscopy was accomplished without difficulty.                            The patient tolerated the procedure well. Scope In: Scope Out: Findings:                 Esophagogastric landmarks were identified: the                            Z-line was found at 35 cm, the gastroesophageal                            junction was found at 35 cm and the upper extent of                            the gastric folds was found at 35 cm from the                            incisors.                           Multiple columns of large (> 5 mm) varices were                            found in the middle and lower third of the                            esophagus. They were large in size and quite                            erythematous near the GEJ.                           The exam of the esophagus was otherwise normal.                           Diffuse atrophic / congested mucosa was found in  the entire examined stomach. I suspect related to                            portal hypertensive changes. Biopsies were taken                            with a cold forceps for Helicobacter pylori testing.                           Suspected small type 2 gastroesophageal varices                            (GOV2, esophageal varices which extend along the                            fundus) were found in the cardia. They were small                            in largest diameter.                           The exam of the stomach was otherwise normal.                           The duodenal bulb and second portion of the                            duodenum were normal. Complications:            No immediate complications. Estimated blood loss:                            Minimal. Estimated Blood Loss:     Estimated blood loss was minimal. Impression:               - Esophagogastric landmarks identified.                            - Large (> 5 mm) esophageal varices with erythema.                           - Gastric mucosal atrophy / congestive changes,                            suspect due to portal hypertension. Biopsied.                           - Suspected small type 2 gastroesophageal varices                            (GOV2, esophageal varices which extend along the                            fundus).                           -  Normal stomach otherwise.                           - Normal duodenal bulb and second portion of the                            duodenum. Recommendation:           - Patient has a contact number available for                            emergencies. The signs and symptoms of potential                            delayed complications were discussed with the                            patient. Return to normal activities tomorrow.                            Written discharge instructions were provided to the                            patient.                           - Resume previous diet.                           - Continue present medications.                           - BMET today, titrate up diuretics as tolerated for                            large volume ascites                           - Cannot use beta blocker right now given large                            volume ascites, will need banding of esophageal                            varices at the hospital. Will discuss with the                            patient                           - Await pathology results. Remo Lipps P. Havery Moros, MD 08/13/2019 10:22:48 AM This report has been signed electronically.

## 2019-08-13 NOTE — Progress Notes (Signed)
Temp JB V/s cw

## 2019-08-13 NOTE — Progress Notes (Signed)
Called to room to assist during endoscopic procedure.  Patient ID and intended procedure confirmed with present staff. Received instructions for my participation in the procedure from the performing physician.  

## 2019-08-13 NOTE — Patient Instructions (Signed)
YOU HAD AN ENDOSCOPIC PROCEDURE TODAY AT THE [facility]:   Refer to the procedure report that was given to you for any specific questions about what was found during the examination.  If the procedure report does not answer your questions, please call your gastroenterologist to clarify.  If you requested that your care partner not be given the details of your procedure findings, then the procedure report has been included in a sealed envelope for you to review at your convenience later.  YOU SHOULD EXPECT: Some feelings of bloating in the abdomen. Passage of more gas than usual.  Walking can help get rid of the air that was put into your GI tract during the procedure and reduce the bloating. If you had a lower endoscopy (such as a colonoscopy or flexible sigmoidoscopy) you may notice spotting of blood in your stool or on the toilet paper. If you underwent a bowel prep for your procedure, you may not have a normal bowel movement for a few days.  Please Note:  You might notice some irritation and congestion in your nose or some drainage.  This is from the oxygen used during your procedure.  There is no need for concern and it should clear up in a day or so.  SYMPTOMS TO REPORT IMMEDIATELY:   Following lower endoscopy (colonoscopy or flexible sigmoidoscopy):  Excessive amounts of blood in the stool  Significant tenderness or worsening of abdominal pains  Swelling of the abdomen that is new, acute  Fever of 100F or higher   Following upper endoscopy (EGD)  Vomiting of blood or coffee ground material  New chest pain or pain under the shoulder blades  Painful or persistently difficult swallowing  New shortness of breath  Fever of 100F or higher  Black, tarry-looking stools  For urgent or emergent issues, a gastroenterologist can be reached at any hour by calling [phone]. Do not use MyChart messaging for urgent concerns.    DIET:  We do recommend a small meal at first, but  then you may proceed to your regular diet.  Drink plenty of fluids but you should avoid alcoholic beverages for 24 hours.  ACTIVITY:  You should plan to take it easy for the rest of today and you should NOT DRIVE or use heavy machinery until tomorrow (because of the sedation medicines used during the test).    FOLLOW UP: Our staff will call the number listed on your records 48-72 hours following your procedure to check on you and address any questions or concerns that you may have regarding the information given to you following your procedure. If we do not reach you, we will leave a message.  We will attempt to reach you two times.  During this call, we will ask if you have developed any symptoms of COVID 19. If you develop any symptoms (ie: fever, flu-like symptoms, shortness of breath, cough etc.) before then, please call [phone].  If you test positive for Covid 19 in the 2 weeks post procedure, please call and report this information to us.    If any biopsies were taken you will be contacted by phone or by letter within the next 1-3 weeks.  Please call us at [phone] if you have not heard about the biopsies in 3 weeks.    SIGNATURES/CONFIDENTIALITY: You and/or your care partner have signed paperwork which will be entered into your electronic medical record.  These signatures attest to the fact that that the information above on   your After Visit Summary has been reviewed and is understood.  Full responsibility of the confidentiality of this discharge information lies with you and/or your care-partner. 

## 2019-08-13 NOTE — Telephone Encounter (Signed)
Patient is returning your call.  

## 2019-08-13 NOTE — Progress Notes (Signed)
pt tolerated well. VSS. awake and to recovery. Report given to RN.  

## 2019-08-14 ENCOUNTER — Other Ambulatory Visit: Payer: Self-pay

## 2019-08-14 ENCOUNTER — Telehealth: Payer: Self-pay

## 2019-08-14 DIAGNOSIS — R188 Other ascites: Secondary | ICD-10-CM

## 2019-08-14 DIAGNOSIS — K7460 Unspecified cirrhosis of liver: Secondary | ICD-10-CM

## 2019-08-14 DIAGNOSIS — K5520 Angiodysplasia of colon without hemorrhage: Secondary | ICD-10-CM

## 2019-08-14 DIAGNOSIS — D509 Iron deficiency anemia, unspecified: Secondary | ICD-10-CM

## 2019-08-14 NOTE — Telephone Encounter (Signed)
Patient returned your call and I provided her with date and time of suggested she is in agreement with that appt please schedule

## 2019-08-14 NOTE — Telephone Encounter (Signed)
Left a message to return call. Dr Loletha Carrow is willing to do the EGD @[facility]  on 08-19-2019 @ 1145am  Needs hospital COVID screening

## 2019-08-14 NOTE — Telephone Encounter (Signed)
Spoke to [place]. She agrees to COVID testing and EGD with Dr Loletha Carrow on 08-19-2019

## 2019-08-15 ENCOUNTER — Other Ambulatory Visit (INDEPENDENT_AMBULATORY_CARE_PROVIDER_SITE_OTHER): Payer: No Typology Code available for payment source

## 2019-08-15 ENCOUNTER — Telehealth: Payer: Self-pay

## 2019-08-15 ENCOUNTER — Other Ambulatory Visit: Payer: Self-pay

## 2019-08-15 ENCOUNTER — Other Ambulatory Visit (HOSPITAL_COMMUNITY)
Admission: RE | Admit: 2019-08-15 | Discharge: 2019-08-15 | Disposition: A | Discharge status: Home / Self Care | Payer: No Typology Code available for payment source | Source: Ambulatory Visit | Attending: Gastroenterology | Admitting: Gastroenterology

## 2019-08-15 ENCOUNTER — Inpatient Hospital Stay (HOSPITAL_COMMUNITY)
Admission: EM | Admit: 2019-08-15 | Discharge: 2019-08-20 | DRG: 433 | Disposition: A | Discharge status: Home / Self Care | Payer: No Typology Code available for payment source | Attending: Internal Medicine | Admitting: Internal Medicine

## 2019-08-15 ENCOUNTER — Encounter (HOSPITAL_COMMUNITY): Payer: Self-pay

## 2019-08-15 DIAGNOSIS — K7460 Unspecified cirrhosis of liver: Principal | ICD-10-CM | POA: Diagnosis present

## 2019-08-15 DIAGNOSIS — N179 Acute kidney failure, unspecified: Secondary | ICD-10-CM | POA: Diagnosis present

## 2019-08-15 DIAGNOSIS — K219 Gastro-esophageal reflux disease without esophagitis: Secondary | ICD-10-CM | POA: Diagnosis present

## 2019-08-15 DIAGNOSIS — Z9049 Acquired absence of other specified parts of digestive tract: Secondary | ICD-10-CM

## 2019-08-15 DIAGNOSIS — E1159 Type 2 diabetes mellitus with other circulatory complications: Secondary | ICD-10-CM

## 2019-08-15 DIAGNOSIS — Z01812 Encounter for preprocedural laboratory examination: Secondary | ICD-10-CM | POA: Insufficient documentation

## 2019-08-15 DIAGNOSIS — D509 Iron deficiency anemia, unspecified: Secondary | ICD-10-CM

## 2019-08-15 DIAGNOSIS — K5520 Angiodysplasia of colon without hemorrhage: Secondary | ICD-10-CM | POA: Diagnosis present

## 2019-08-15 DIAGNOSIS — Z880 Allergy status to penicillin: Secondary | ICD-10-CM

## 2019-08-15 DIAGNOSIS — Z8249 Family history of ischemic heart disease and other diseases of the circulatory system: Secondary | ICD-10-CM

## 2019-08-15 DIAGNOSIS — E559 Vitamin D deficiency, unspecified: Secondary | ICD-10-CM | POA: Diagnosis present

## 2019-08-15 DIAGNOSIS — I959 Hypotension, unspecified: Secondary | ICD-10-CM | POA: Diagnosis present

## 2019-08-15 DIAGNOSIS — E785 Hyperlipidemia, unspecified: Secondary | ICD-10-CM | POA: Diagnosis present

## 2019-08-15 DIAGNOSIS — K7581 Nonalcoholic steatohepatitis (NASH): Secondary | ICD-10-CM | POA: Diagnosis present

## 2019-08-15 DIAGNOSIS — Z79899 Other long term (current) drug therapy: Secondary | ICD-10-CM

## 2019-08-15 DIAGNOSIS — Z7982 Long term (current) use of aspirin: Secondary | ICD-10-CM

## 2019-08-15 DIAGNOSIS — T500X5A Adverse effect of mineralocorticoids and their antagonists, initial encounter: Secondary | ICD-10-CM | POA: Diagnosis present

## 2019-08-15 DIAGNOSIS — I152 Hypertension secondary to endocrine disorders: Secondary | ICD-10-CM | POA: Diagnosis present

## 2019-08-15 DIAGNOSIS — Z888 Allergy status to other drugs, medicaments and biological substances status: Secondary | ICD-10-CM

## 2019-08-15 DIAGNOSIS — I864 Gastric varices: Secondary | ICD-10-CM | POA: Diagnosis present

## 2019-08-15 DIAGNOSIS — K766 Portal hypertension: Secondary | ICD-10-CM | POA: Diagnosis present

## 2019-08-15 DIAGNOSIS — R188 Other ascites: Secondary | ICD-10-CM

## 2019-08-15 DIAGNOSIS — E119 Type 2 diabetes mellitus without complications: Secondary | ICD-10-CM

## 2019-08-15 DIAGNOSIS — K3189 Other diseases of stomach and duodenum: Secondary | ICD-10-CM | POA: Diagnosis present

## 2019-08-15 DIAGNOSIS — E1169 Type 2 diabetes mellitus with other specified complication: Secondary | ICD-10-CM | POA: Diagnosis present

## 2019-08-15 DIAGNOSIS — Z20822 Contact with and (suspected) exposure to covid-19: Secondary | ICD-10-CM | POA: Insufficient documentation

## 2019-08-15 DIAGNOSIS — Z882 Allergy status to sulfonamides status: Secondary | ICD-10-CM

## 2019-08-15 DIAGNOSIS — K2950 Unspecified chronic gastritis without bleeding: Secondary | ICD-10-CM | POA: Diagnosis present

## 2019-08-15 DIAGNOSIS — I8510 Secondary esophageal varices without bleeding: Secondary | ICD-10-CM | POA: Diagnosis present

## 2019-08-15 DIAGNOSIS — E871 Hypo-osmolality and hyponatremia: Secondary | ICD-10-CM | POA: Diagnosis present

## 2019-08-15 DIAGNOSIS — Z886 Allergy status to analgesic agent status: Secondary | ICD-10-CM

## 2019-08-15 DIAGNOSIS — Z833 Family history of diabetes mellitus: Secondary | ICD-10-CM

## 2019-08-15 DIAGNOSIS — Z825 Family history of asthma and other chronic lower respiratory diseases: Secondary | ICD-10-CM

## 2019-08-15 DIAGNOSIS — E875 Hyperkalemia: Secondary | ICD-10-CM | POA: Diagnosis not present

## 2019-08-15 DIAGNOSIS — K648 Other hemorrhoids: Secondary | ICD-10-CM | POA: Diagnosis present

## 2019-08-15 DIAGNOSIS — J302 Other seasonal allergic rhinitis: Secondary | ICD-10-CM | POA: Diagnosis present

## 2019-08-15 DIAGNOSIS — R630 Anorexia: Secondary | ICD-10-CM | POA: Diagnosis present

## 2019-08-15 DIAGNOSIS — Z7984 Long term (current) use of oral hypoglycemic drugs: Secondary | ICD-10-CM

## 2019-08-15 LAB — CBC
HCT: 33.5 % — ABNORMAL LOW (ref 36.0–46.0)
Hemoglobin: 10.7 g/dL — ABNORMAL LOW (ref 12.0–15.0)
MCH: 26.8 pg (ref 26.0–34.0)
MCHC: 31.9 g/dL (ref 30.0–36.0)
MCV: 83.8 fL (ref 80.0–100.0)
Platelets: 144 10*3/uL — ABNORMAL LOW (ref 150–400)
RBC: 4 MIL/uL (ref 3.87–5.11)
RDW: 16.1 % — ABNORMAL HIGH (ref 11.5–15.5)
WBC: 6.3 10*3/uL (ref 4.0–10.5)
nRBC: 0 % (ref 0.0–0.2)

## 2019-08-15 LAB — BASIC METABOLIC PANEL
Anion gap: 9 (ref 5–15)
BUN: 25 mg/dL — ABNORMAL HIGH (ref 6–23)
BUN: 23 mg/dL — ABNORMAL HIGH (ref 6–20)
CO2: 18 mEq/L — ABNORMAL LOW (ref 19–32)
CO2: 15 mmol/L — ABNORMAL LOW (ref 22–32)
Calcium: 9.1 mg/dL (ref 8.4–10.5)
Calcium: 9.3 mg/dL (ref 8.9–10.3)
Chloride: 102 mEq/L (ref 96–112)
Chloride: 104 mmol/L (ref 98–111)
Creatinine, Ser: 1.26 mg/dL — ABNORMAL HIGH (ref 0.40–1.20)
Creatinine, Ser: 1.38 mg/dL — ABNORMAL HIGH (ref 0.44–1.00)
GFR calc Af Amer: 49 mL/min — ABNORMAL LOW (ref >60)
GFR calc non Af Amer: 43 mL/min — ABNORMAL LOW (ref >60)
GFR: 43.82 mL/min — ABNORMAL LOW (ref >60.00)
Glucose, Bld: 67 mg/dL — ABNORMAL LOW (ref 70–99)
Glucose, Bld: 70 mg/dL (ref 70–99)
Potassium: 6.2 mEq/L (ref 3.5–5.1)
Potassium: 6.4 mmol/L (ref 3.5–5.1)
Sodium: 126 mEq/L — ABNORMAL LOW (ref 135–145)
Sodium: 128 mmol/L — ABNORMAL LOW (ref 135–145)

## 2019-08-15 LAB — TROPONIN I (HIGH SENSITIVITY)
Troponin I (High Sensitivity): 5 ng/L (ref <18)
Troponin I (High Sensitivity): 5 ng/L (ref <18)

## 2019-08-15 LAB — I-STAT BETA HCG BLOOD, ED (MC, WL, AP ONLY): I-stat hCG, quantitative: <5 m[IU]/mL (ref <5)

## 2019-08-15 LAB — SARS CORONAVIRUS 2 (TAT 6-24 HRS): SARS Coronavirus 2: NEGATIVE

## 2019-08-15 MED ORDER — SODIUM CHLORIDE 0.9% FLUSH
3.0000 mL | Freq: Once | INTRAVENOUS | Status: AC
  Administered 2019-08-16: 3 mL via INTRAVENOUS

## 2019-08-15 NOTE — ED Triage Notes (Signed)
Pt arrives to ED w/ c/o hyperkalemia. Pt's PCP told her to come in to ED for k 6.2.

## 2019-08-15 NOTE — Telephone Encounter (Signed)
Patient called back.  Relayed results and recommendations. Patient has already taken aldactone  today. She understands to take another dose of lasix and will go to the ER for evaluation.

## 2019-08-15 NOTE — Telephone Encounter (Signed)
First attempt follow up call to pt, lm on vm.

## 2019-08-15 NOTE — Telephone Encounter (Signed)
Hope from the lab called with a critical potassium level of 6.2.  Discussed result with Dr. Havery Moros. He would like pt to stop her Aldactone, take another dose of lasix and go to the ER for management of her potassium. She will need an EKG. Called pt but she did not answer. Left a detailed message that we need to speak with her right away and to call the office to discuss her critical lab result. She needs to ask for [name] or Barbera Setters.

## 2019-08-15 NOTE — Telephone Encounter (Signed)
Aldactone removed from med list.

## 2019-08-15 NOTE — Telephone Encounter (Signed)
Second follow up call attempt, no answer LM

## 2019-08-15 NOTE — Addendum Note (Signed)
Addended by: Roetta Sessions on: 08/15/2019 05:43 PM   Modules accepted: Orders

## 2019-08-15 NOTE — Telephone Encounter (Signed)
Thanks [name] I increased her lasix dosing hoping that would lower the K but it has risen a point and I think she needs to stop aldactone altogether. This lab was supposed to not have been drawn for a week but she had it drawn today, but now given the value at 6.2 she needs to go to the ED for repeat lab draw and management if it persists. I would put her on lasix monotherapy and hold aldactone once stablized.

## 2019-08-16 ENCOUNTER — Other Ambulatory Visit: Payer: Self-pay

## 2019-08-16 ENCOUNTER — Encounter (HOSPITAL_COMMUNITY): Payer: Self-pay | Admitting: Internal Medicine

## 2019-08-16 DIAGNOSIS — N179 Acute kidney failure, unspecified: Secondary | ICD-10-CM | POA: Diagnosis present

## 2019-08-16 DIAGNOSIS — E871 Hypo-osmolality and hyponatremia: Secondary | ICD-10-CM | POA: Diagnosis present

## 2019-08-16 DIAGNOSIS — Z20822 Contact with and (suspected) exposure to covid-19: Secondary | ICD-10-CM | POA: Diagnosis present

## 2019-08-16 DIAGNOSIS — K3189 Other diseases of stomach and duodenum: Secondary | ICD-10-CM | POA: Diagnosis present

## 2019-08-16 DIAGNOSIS — K219 Gastro-esophageal reflux disease without esophagitis: Secondary | ICD-10-CM | POA: Diagnosis present

## 2019-08-16 DIAGNOSIS — E785 Hyperlipidemia, unspecified: Secondary | ICD-10-CM | POA: Diagnosis present

## 2019-08-16 DIAGNOSIS — K7460 Unspecified cirrhosis of liver: Secondary | ICD-10-CM

## 2019-08-16 DIAGNOSIS — K766 Portal hypertension: Secondary | ICD-10-CM | POA: Diagnosis present

## 2019-08-16 DIAGNOSIS — E119 Type 2 diabetes mellitus without complications: Secondary | ICD-10-CM | POA: Diagnosis not present

## 2019-08-16 DIAGNOSIS — D509 Iron deficiency anemia, unspecified: Secondary | ICD-10-CM | POA: Diagnosis present

## 2019-08-16 DIAGNOSIS — K5520 Angiodysplasia of colon without hemorrhage: Secondary | ICD-10-CM | POA: Diagnosis present

## 2019-08-16 DIAGNOSIS — E1159 Type 2 diabetes mellitus with other circulatory complications: Secondary | ICD-10-CM | POA: Diagnosis not present

## 2019-08-16 DIAGNOSIS — I864 Gastric varices: Secondary | ICD-10-CM | POA: Diagnosis present

## 2019-08-16 DIAGNOSIS — I959 Hypotension, unspecified: Secondary | ICD-10-CM | POA: Diagnosis present

## 2019-08-16 DIAGNOSIS — E559 Vitamin D deficiency, unspecified: Secondary | ICD-10-CM | POA: Diagnosis present

## 2019-08-16 DIAGNOSIS — K648 Other hemorrhoids: Secondary | ICD-10-CM | POA: Diagnosis present

## 2019-08-16 DIAGNOSIS — E875 Hyperkalemia: Secondary | ICD-10-CM | POA: Diagnosis present

## 2019-08-16 DIAGNOSIS — I8510 Secondary esophageal varices without bleeding: Secondary | ICD-10-CM | POA: Diagnosis present

## 2019-08-16 DIAGNOSIS — R630 Anorexia: Secondary | ICD-10-CM | POA: Diagnosis present

## 2019-08-16 DIAGNOSIS — I152 Hypertension secondary to endocrine disorders: Secondary | ICD-10-CM

## 2019-08-16 DIAGNOSIS — J302 Other seasonal allergic rhinitis: Secondary | ICD-10-CM | POA: Diagnosis present

## 2019-08-16 DIAGNOSIS — E1169 Type 2 diabetes mellitus with other specified complication: Secondary | ICD-10-CM | POA: Diagnosis present

## 2019-08-16 DIAGNOSIS — K7581 Nonalcoholic steatohepatitis (NASH): Secondary | ICD-10-CM | POA: Diagnosis present

## 2019-08-16 DIAGNOSIS — K2950 Unspecified chronic gastritis without bleeding: Secondary | ICD-10-CM | POA: Diagnosis present

## 2019-08-16 DIAGNOSIS — R188 Other ascites: Secondary | ICD-10-CM | POA: Diagnosis present

## 2019-08-16 HISTORY — DX: Unspecified cirrhosis of liver: K74.60

## 2019-08-16 LAB — HEPATIC FUNCTION PANEL
ALT: 22 U/L (ref 0–44)
AST: 38 U/L (ref 15–41)
Albumin: 3 g/dL — ABNORMAL LOW (ref 3.5–5.0)
Alkaline Phosphatase: 63 U/L (ref 38–126)
Bilirubin, Direct: 0.6 mg/dL — ABNORMAL HIGH (ref 0.0–0.2)
Indirect Bilirubin: 1.1 mg/dL — ABNORMAL HIGH (ref 0.3–0.9)
Total Bilirubin: 1.7 mg/dL — ABNORMAL HIGH (ref 0.3–1.2)
Total Protein: 6.8 g/dL (ref 6.5–8.1)

## 2019-08-16 LAB — BASIC METABOLIC PANEL
Anion gap: 9 (ref 5–15)
BUN: 23 mg/dL — ABNORMAL HIGH (ref 6–20)
CO2: 15 mmol/L — ABNORMAL LOW (ref 22–32)
Calcium: 8.7 mg/dL — ABNORMAL LOW (ref 8.9–10.3)
Chloride: 104 mmol/L (ref 98–111)
Creatinine, Ser: 1.19 mg/dL — ABNORMAL HIGH (ref 0.44–1.00)
GFR calc Af Amer: 59 mL/min — ABNORMAL LOW (ref >60)
GFR calc non Af Amer: 51 mL/min — ABNORMAL LOW (ref >60)
Glucose, Bld: 73 mg/dL (ref 70–99)
Potassium: 5.5 mmol/L — ABNORMAL HIGH (ref 3.5–5.1)
Sodium: 128 mmol/L — ABNORMAL LOW (ref 135–145)

## 2019-08-16 LAB — RENAL FUNCTION PANEL
Albumin: 2.4 g/dL — ABNORMAL LOW (ref 3.5–5.0)
Anion gap: 7 (ref 5–15)
BUN: 24 mg/dL — ABNORMAL HIGH (ref 6–20)
CO2: 17 mmol/L — ABNORMAL LOW (ref 22–32)
Calcium: 8.6 mg/dL — ABNORMAL LOW (ref 8.9–10.3)
Chloride: 104 mmol/L (ref 98–111)
Creatinine, Ser: 1.31 mg/dL — ABNORMAL HIGH (ref 0.44–1.00)
GFR calc Af Amer: 53 mL/min — ABNORMAL LOW (ref >60)
GFR calc non Af Amer: 45 mL/min — ABNORMAL LOW (ref >60)
Glucose, Bld: 87 mg/dL (ref 70–99)
Phosphorus: 5.1 mg/dL — ABNORMAL HIGH (ref 2.5–4.6)
Potassium: 6.3 mmol/L (ref 3.5–5.1)
Sodium: 128 mmol/L — ABNORMAL LOW (ref 135–145)

## 2019-08-16 LAB — CBC
HCT: 31.1 % — ABNORMAL LOW (ref 36.0–46.0)
Hemoglobin: 9.9 g/dL — ABNORMAL LOW (ref 12.0–15.0)
MCH: 27 pg (ref 26.0–34.0)
MCHC: 31.8 g/dL (ref 30.0–36.0)
MCV: 84.7 fL (ref 80.0–100.0)
Platelets: UNDETERMINED 10*3/uL (ref 150–400)
RBC: 3.67 MIL/uL — ABNORMAL LOW (ref 3.87–5.11)
RDW: 16.1 % — ABNORMAL HIGH (ref 11.5–15.5)
WBC: 4.9 10*3/uL (ref 4.0–10.5)
nRBC: 0 % (ref 0.0–0.2)

## 2019-08-16 LAB — GLUCOSE, CAPILLARY
Glucose-Capillary: 58 mg/dL — ABNORMAL LOW (ref 70–99)
Glucose-Capillary: 74 mg/dL (ref 70–99)
Glucose-Capillary: 99 mg/dL (ref 70–99)
Glucose-Capillary: 84 mg/dL (ref 70–99)
Glucose-Capillary: 99 mg/dL (ref 70–99)

## 2019-08-16 LAB — HIV ANTIBODY (ROUTINE TESTING W REFLEX): HIV Screen 4th Generation wRfx: NONREACTIVE

## 2019-08-16 LAB — PROTIME-INR
INR: 1.3 — ABNORMAL HIGH (ref 0.8–1.2)
Prothrombin Time: 16.1 seconds — ABNORMAL HIGH (ref 11.4–15.2)

## 2019-08-16 MED ORDER — HEPARIN SODIUM (PORCINE) 5000 UNIT/ML IJ SOLN
5000.0000 [IU] | Freq: Three times a day (TID) | INTRAMUSCULAR | Status: AC
  Administered 2019-08-16 – 2019-08-19 (×12): 5000 [IU] via SUBCUTANEOUS
  Filled 2019-08-16 (×11): qty 1

## 2019-08-16 MED ORDER — SODIUM POLYSTYRENE SULFONATE 15 GM/60ML PO SUSP
30.0000 g | Freq: Once | ORAL | Status: AC
  Administered 2019-08-16: 30 g via ORAL
  Filled 2019-08-16: qty 120

## 2019-08-16 MED ORDER — FUROSEMIDE 40 MG PO TABS
40.0000 mg | ORAL_TABLET | Freq: Every day | ORAL | Status: DC
  Administered 2019-08-16 – 2019-08-17 (×2): 40 mg via ORAL
  Filled 2019-08-16 (×2): qty 1

## 2019-08-16 MED ORDER — SODIUM CHLORIDE 0.9 % IV BOLUS
500.0000 mL | Freq: Once | INTRAVENOUS | Status: AC
  Administered 2019-08-16: 500 mL via INTRAVENOUS

## 2019-08-16 MED ORDER — FUROSEMIDE 10 MG/ML IJ SOLN
40.0000 mg | Freq: Once | INTRAMUSCULAR | Status: DC
  Filled 2019-08-16: qty 4

## 2019-08-16 MED ORDER — SODIUM CHLORIDE 0.9% FLUSH
3.0000 mL | Freq: Two times a day (BID) | INTRAVENOUS | Status: DC
  Administered 2019-08-16 – 2019-08-19 (×7): 3 mL via INTRAVENOUS

## 2019-08-16 MED ORDER — SODIUM ZIRCONIUM CYCLOSILICATE 10 G PO PACK
10.0000 g | PACK | Freq: Once | ORAL | Status: AC
  Administered 2019-08-16: 10 g via ORAL
  Filled 2019-08-16: qty 1

## 2019-08-16 MED ORDER — SODIUM BICARBONATE 650 MG PO TABS
650.0000 mg | ORAL_TABLET | Freq: Three times a day (TID) | ORAL | Status: DC
  Administered 2019-08-16 – 2019-08-19 (×11): 650 mg via ORAL
  Filled 2019-08-16 (×11): qty 1

## 2019-08-16 MED ORDER — ESCITALOPRAM OXALATE 10 MG PO TABS
10.0000 mg | ORAL_TABLET | Freq: Every day | ORAL | Status: DC
  Administered 2019-08-16 – 2019-08-19 (×5): 10 mg via ORAL
  Filled 2019-08-16 (×5): qty 1

## 2019-08-16 MED ORDER — FUROSEMIDE 20 MG PO TABS
80.0000 mg | ORAL_TABLET | Freq: Once | ORAL | Status: AC
  Administered 2019-08-16: 80 mg via ORAL
  Filled 2019-08-16: qty 4

## 2019-08-16 NOTE — H&P (Signed)
History and Physical    Meagan Peck AJO:[id number] DOB: 03-04-1961 DOA: 08/15/2019  PCP: [name], DO  Patient coming from: Home  I have personally briefly reviewed patient's old medical records in [place]  Chief Complaint: Abnormal labs  HPI: Meagan Peck is a 59 y.o. female with medical history significant for hepatic cirrhosis with ascites, esophageal varices, and portal hypertensive gastropathy, type 2 diabetes, hypertension, hyperlipidemia, iron deficiency anemia who presents to the ED for evaluation of abnormal labs.  Patient is being evaluated for recently found hepatic cirrhosis by her gastroenterology team.   On 07/31/2018 when she was started on diuretic therapy with spironolactone 50 mg daily and Lasix 40 mg daily for management of her ascites.   She underwent paracentesis on 08/01/2019 with 10 L of peritoneal fluid removed.  Cytology was negative for malignant cells and culture no growth.  On 08/13/2019 she underwent EGD which showed large esophageal varices and small gastroesophageal varices.  Colonoscopy showed few small benign-appearing nonbleeding colonic angiodysplastic lesions and internal hemorrhoids.  She was plan to undergo gastric varices banding on 08/19/2019.  She had labs on 08/13/2019 which showed mildly increased potassium of 5.2.  She was advised to increase her Lasix to 40 mg twice a day and continue spironolactone 50 mg daily.  She had repeat lab work performed on 08/15/2019 which showed an increase potassium to 6.2.  She was advised to discontinue her spironolactone and come to the ED for further evaluation.  She currently reports beginning to have recurrent swelling of her abdomen but denies any abdominal pain.  She denies any shortness of breath, cough, chest pain, palpitations, nausea, vomiting, dysuria, diarrhea, or constipation.    ED Course:  Initial vitals showed BP 98/60, pulse 83, RR 18, temp 97.8 Fahrenheit, SPO2 99% on room  air.  Labs are notable for potassium 6.4, sodium 128, bicarb 15, BUN 23, creatinine 1.38, WBC 6.3, hemoglobin 10.7, platelets 144,000, high-sensitivity troponin I 5x2, i-STAT beta-hCG undetectable.  Patient was given IV Lasix 40 mg once and 500 cc normal saline.  The hospitalist service was consulted to admit for further evaluation management.  Review of Systems: All systems reviewed and are negative except as documented in history of present illness above.   Past Medical History:  Diagnosis Date  . Allergy   . Arthritis   . Cataract   . Diabetes mellitus without complication ([place])   . GERD (gastroesophageal reflux disease)   . Hypertension   . Iron deficiency anemia 09/26/2018    Past Surgical History:  Procedure Laterality Date  . CARPAL TUNNEL RELEASE Right   . CESAREAN SECTION    . CHOLECYSTECTOMY    . IR PARACENTESIS  08/01/2019  . KNEE ARTHROSCOPY Right 12/10/2015   Procedure: RIGHT KNEE ARTHROSCOPY WITH PARTIAL MEDIAL MENISCECTOMY;  Surgeon: Mcarthur Rossetti, MD;  Location: [facility];  Service: Orthopedics;  Laterality: Right;  . KNEE ARTHROSCOPY W/ MENISCAL REPAIR Bilateral   . MENISCUS REPAIR Left     Social History:  reports that she has never smoked. She has never used smokeless tobacco. She reports that she does not drink alcohol or use drugs.  Allergies  Allergen Reactions  . Penicillins Anaphylaxis    Happened as a young child  . Atrovent [Ipratropium] Palpitations    As per patient  . Naproxen Rash  . Quinine Derivatives Rash  . Sulfa Antibiotics Rash    Family History  Problem Relation Age of Onset  . Hypertension Mother   .  COPD Mother   . Alcoholism Father   . Heart attack Father   . Diabetes Brother   . Diabetes Paternal Grandmother   . Heart disease Paternal Grandmother   . Alcoholism Paternal Uncle   . Alcoholism Maternal Uncle   . Heart disease Maternal Grandmother   . Heart disease Maternal Grandfather   . Heart disease Paternal  Grandfather      Prior to Admission medications   Medication Sig Start Date End Date Taking? Authorizing Provider  ACCU-CHEK FASTCLIX LANCETS MISC Check fsbs TID 08/09/16   [provider]  aspirin 81 MG tablet Take 81 mg by mouth daily.    [provider]  blood glucose meter kit and supplies KIT Dispense based on patient and insurance preference. Use up to four times daily as directed. Please include lancets, test strips, control solution. 05/16/19   [name], DO  cetirizine (ZYRTEC) 10 MG tablet Take 1 tablet (10 mg total) by mouth daily. Patient not taking: Reported on 08/13/2019 01/02/19   [name], DO  Cholecalciferol (VITAMIN D3) 5000 UNITS TABS Take 5,000 Units by mouth daily.     [provider]  diclofenac sodium (VOLTAREN) 1 % GEL Apply 4 g topically 4 (four) times daily. To affected joint. Patient not taking: Reported on 08/13/2019 10/05/17   Gregor Hams, MD  escitalopram (LEXAPRO) 10 MG tablet TAKE 1 TABLET (10 MG TOTAL) BY MOUTH EVERY EVENING. 05/31/19   [name], DO  ferrous sulfate 325 (65 FE) MG EC tablet Take 1 tablet (325 mg total) by mouth 2 (two) times daily with a meal. Patient taking differently: Take 325 mg by mouth daily.  09/26/18   [name], DO  furosemide (LASIX) 40 MG tablet Take 1 tablet (40 mg total) by mouth daily. 07/31/19   Levin Erp, PA  glucose blood (ACCU-CHEK GUIDE) test strip Use to check blood sugar 3 times daily. Dx:E08.00 02/26/18   [name], DO  ibuprofen (ADVIL,MOTRIN) 800 MG tablet Take 1 tablet (800 mg total) by mouth every 8 (eight) hours as needed for moderate pain. Patient not taking: Reported on 08/13/2019 05/15/17   [name], DO  Insulin Pen Needle (ULTICARE SHORT PEN NEEDLES) 31G X 8 MM MISC USE AS DIRECTED TO INJECT LANTUS AND BYETTA 10/19/18   [name], DO  Krill Oil 500 MG CAPS Take 500 mg by mouth daily.    [provider]   lisinopril (ZESTRIL) 40 MG tablet TAKE 1 TABLET (40 MG TOTAL) BY MOUTH DAILY. 05/31/19   [name], DO  magnesium oxide (MAG-OX) 400 MG tablet Take 1 tablet (400 mg total) by mouth 2 (two) times daily. 01/02/19   [name], DO  metFORMIN (GLUCOPHAGE) 1000 MG tablet TAKE 1 TABLET (1,000 MG TOTAL) BY MOUTH 2 (TWO) TIMES DAILY WITH A MEAL. 07/03/19   [name], DO  metoCLOPramide (REGLAN) 10 MG tablet Take 1 tablet 30 minutes before each half the prep. Patient not taking: Reported on 08/13/2019 07/31/19   Levin Erp, PA  OZEMPIC, 0.25 OR 0.5 MG/DOSE, 2 MG/1.5ML SOPN INJECT 0.5 MG INTO THE SKIN ONCE A WEEK. 08/13/19   [name], DO  rosuvastatin (CRESTOR) 20 MG tablet Take 1 tablet (20 mg total) by mouth 4 (four) times a week. 11/13/18   [name], DO    Physical Exam: Vitals:   08/15/19 2305 08/16/19 0000 08/16/19 0015 08/16/19 0030  BP: (!) _0 (!) 89/57  Pulse: 63     Resp:  14 (!) _0 Temp:      TempSrc:      SpO2: 100%      Constitutional: Resting supine in bed, NAD, calm, comfortable Eyes: PERRL, lids and conjunctivae normal ENMT: Mucous membranes are moist. Posterior pharynx clear of any exudate or lesions..  Neck: normal, supple, no masses. Respiratory: clear to auscultation bilaterally, no wheezing, no crackles. Normal respiratory effort. No accessory muscle use.  Cardiovascular: Regular rate and rhythm, no murmurs / rubs / gallops. No extremity edema. 2+ pedal pulses. Abdomen: Distended abdomen, no tenderness, no masses palpated. Bowel sounds positive.  Musculoskeletal: no clubbing / cyanosis. No joint deformity upper and lower extremities. Good ROM, no contractures. Normal muscle tone.  Skin: no rashes, lesions, ulcers. No induration Neurologic: CN 2-12 grossly intact. Sensation intact, Strength 5/5 in all 4.  Psychiatric: Normal judgment and insight. Alert and oriented x 3. Normal mood.     Labs on  Admission: I have personally reviewed following labs and imaging studies  CBC: Recent Labs  Lab 08/15/19 1915  WBC 6.3  HGB 10.7*  HCT 33.5*  MCV 83.8  PLT 572*   Basic Metabolic Panel: Recent Labs  Lab 08/13/19 1053 08/15/19 1255 08/15/19 1915  NA 126* 126* 128*  K 5.2* 6.2 No hemolysis seen* 6.4*  CL 103 102 104  CO2 16* 18* 15*  GLUCOSE 70 67* 70  BUN 23 25* 23*  CREATININE 0.88 1.26* 1.38*  CALCIUM 8.6 9.1 9.3   GFR: Estimated Creatinine Clearance: 39.1 mL/min (A) (by C-G formula based on SCr of 1.38 mg/dL (H)). Liver Function Tests: No results for input(s): AST, ALT, ALKPHOS, BILITOT, PROT, ALBUMIN in the last 168 hours. No results for input(s): LIPASE, AMYLASE in the last 168 hours. No results for input(s): AMMONIA in the last 168 hours. Coagulation Profile: No results for input(s): INR, PROTIME in the last 168 hours. Cardiac Enzymes: No results for input(s): CKTOTAL, CKMB, CKMBINDEX, TROPONINI in the last 168 hours. BNP (last 3 results) No results for input(s): PROBNP in the last 8760 hours. HbA1C: No results for input(s): HGBA1C in the last 72 hours. CBG: No results for input(s): GLUCAP in the last 168 hours. Lipid Profile: No results for input(s): CHOL, HDL, LDLCALC, TRIG, CHOLHDL, LDLDIRECT in the last 72 hours. Thyroid Function Tests: No results for input(s): TSH, T4TOTAL, FREET4, T3FREE, THYROIDAB in the last 72 hours. Anemia Panel: No results for input(s): VITAMINB12, FOLATE, FERRITIN, TIBC, IRON, RETICCTPCT in the last 72 hours. Urine analysis:    Component Value Date/Time   COLORURINE DARK YELLOW 02/09/2018 [place] 02/09/2018 1149   LABSPEC 1.013 02/09/2018 1149   PHURINE 6.5 02/09/2018 1149   GLUCOSEU NEGATIVE 02/09/2018 [place] 02/09/2018 [place] 02/09/2018 1149   PROTEINUR TRACE (A) 02/09/2018 1149   NITRITE NEGATIVE 02/09/2018 [place] 02/09/2018 1149     Radiological Exams on Admission: No results found.  EKG: Independently reviewed.  Sinus rhythm with low voltage, not significantly changed when compared to prior.  Assessment/Plan Principal Problem:   Hyperkalemia Active Problems:   Hyperlipidemia associated with type 2 diabetes mellitus (HCC)   Type 2 diabetes mellitus without complication, with long-term current use of insulin (HCC)   Hypertension associated with diabetes ([place])   Cirrhosis ([place])  Meagan Peck is a 59 y.o. female with medical history significant for hepatic cirrhosis with ascites, esophageal varices, and portal hypertensive gastropathy, type 2 diabetes, hypertension, hyperlipidemia, iron  deficiency anemia who is admitted with hyperkalemia.  Hyperkalemia: K 6.4 on admission in the setting of spironolactone, lisinopril, and AKI.  She was planned to receive IV Lasix 40 mg in the ED but have been unable to obtain IV access.  No EKG changes noted. -Will switch Lasix 80 mg orally once now and resume home 40 mg daily in the morning -Give Kayexalate 10 mg once -Hold spironolactone, lisinopril  Hyponatremia: Mild in the setting of cirrhosis with ascites.  She was given 500 cc normal saline in the ED.  Hold further fluids and recheck in a.m.  Hepatic cirrhosis with ascites, esophageal varices, and portal hypertensive gastropathy: S/p paracentesis 08/01/2019 with 10 L removed.  Abdomen distended again with ascites on examination.  Otherwise no evidence of hepatic encephalopathy or decompensation. -Lasix as above, holding spironolactone due to hyperkalemia -Can consider repeat paracentesis -Follow-up with GI, plan for gastric varices banding on 08/19/2019  Type 2 diabetes: On Metformin and Ozempic as an outpatient.  Serum glucose 70 on admission.  We will continue to monitor.  Hypertension: Hypotensive on arrival.  Holding spironolactone and lisinopril as above.    Hyperlipidemia: On rosuvastatin as an outpatient,  currently on hold.  DVT prophylaxis: Subcutaneous heparin Code Status: Full code, confirmed with patient Family Communication: Discussed with husband at bedside Disposition Plan: From home, likely discharge to home pending correction of hyperkalemia Consults called: None Admission status: Observation  Status is: Observation  The patient remains OBS appropriate and will d/c before 2 midnights.  Dispo: The patient is from: Home              Anticipated d/c is to: Home              Anticipated d/c date is: 1 day              Patient currently is not medically stable to d/c.  Zada Finders MD [facility]  If 7PM-7AM, please contact night-coverage [email or web address]  08/16/2019, 12:39 AM

## 2019-08-16 NOTE — ED Provider Notes (Signed)
[place] [facility] EMERGENCY DEPARTMENT Provider Note  CSN: [id number] Arrival date & time: 08/15/19 1756  Chief Complaint(s) Abnormal Lab  HPI Meagan Peck is a 59 y.o. female with a past medical history listed below including cirrhosis with ascites currently being worked up by [facility] who presents to the emergency department for abnormal labs.  Patient had routine labs drawn and noted she had hypokalemia.  It is up one-point from 3 days ago.  Patient was recently placed on Aldactone which has not been discontinued.  She is currently on Lasix.  Patient denies any chest pain or shortness of breath.  She denies any dysrhythmias or palpitations.  No fatigue.  No other physical complaints.  HPI  Past Medical History Past Medical History:  Diagnosis Date  . Allergy   . Arthritis   . Cataract   . Diabetes mellitus without complication ([place])   . GERD (gastroesophageal reflux disease)   . Hypertension   . Iron deficiency anemia 09/26/2018   Patient Active Problem List   Diagnosis Date Noted  . Iron deficiency anemia 09/26/2018  . Syncope 11/27/2017  . Chest pain 11/27/2017  . Vitamin D deficiency 05/15/2017  . Iron deficiency 05/15/2017  . Breast screening declined 05/15/2017  . Colonoscopy refused 05/15/2017  . Chronic right shoulder pain 05/15/2017  . Choroidal nevus of both eyes 07/18/2016  . Corneal epithelial and basement membrane dystrophy 07/18/2016  . Thrombocytopenia ([place]) 04/14/2016  . Acute medial meniscus tear of right knee 12/10/2015  . Benign essential HTN 05/25/2015  . Dyslipidemia 05/25/2015  . Gastroesophageal reflux disease 05/25/2015  . Type 2 diabetes mellitus without complication, with long-term current use of insulin ([place]) 05/25/2015  . Diabetes ([place]) 07/23/2012   Home Medication(s) Prior to Admission medications   Medication Sig Start Date End Date Taking? Authorizing Provider  ACCU-CHEK FASTCLIX LANCETS MISC Check fsbs TID 08/09/16    [provider]  aspirin 81 MG tablet Take 81 mg by mouth daily.    [provider]  blood glucose meter kit and supplies KIT Dispense based on patient and insurance preference. Use up to four times daily as directed. Please include lancets, test strips, control solution. 05/16/19   [name], DO  cetirizine (ZYRTEC) 10 MG tablet Take 1 tablet (10 mg total) by mouth daily. Patient not taking: Reported on 08/13/2019 01/02/19   [name], DO  Cholecalciferol (VITAMIN D3) 5000 UNITS TABS Take 5,000 Units by mouth daily.     [provider]  diclofenac sodium (VOLTAREN) 1 % GEL Apply 4 g topically 4 (four) times daily. To affected joint. Patient not taking: Reported on 08/13/2019 10/05/17   Gregor Hams, MD  escitalopram (LEXAPRO) 10 MG tablet TAKE 1 TABLET (10 MG TOTAL) BY MOUTH EVERY EVENING. 05/31/19   [name], DO  ferrous sulfate 325 (65 FE) MG EC tablet Take 1 tablet (325 mg total) by mouth 2 (two) times daily with a meal. Patient taking differently: Take 325 mg by mouth daily.  09/26/18   [name], DO  furosemide (LASIX) 40 MG tablet Take 1 tablet (40 mg total) by mouth daily. 07/31/19   Levin Erp, PA  glucose blood (ACCU-CHEK GUIDE) test strip Use to check blood sugar 3 times daily. Dx:E08.00 02/26/18   [name], DO  ibuprofen (ADVIL,MOTRIN) 800 MG tablet Take 1 tablet (800 mg total) by mouth every 8 (eight) hours as needed for moderate pain. Patient not taking: Reported on 08/13/2019 05/15/17   [name],  DO  Insulin Pen Needle (ULTICARE SHORT PEN NEEDLES) 31G X 8 MM MISC USE AS DIRECTED TO INJECT LANTUS AND BYETTA 10/19/18   [name], DO  Krill Oil 500 MG CAPS Take 500 mg by mouth daily.    [provider]  lisinopril (ZESTRIL) 40 MG tablet TAKE 1 TABLET (40 MG TOTAL) BY MOUTH DAILY. 05/31/19   [name], DO  magnesium oxide (MAG-OX) 400 MG tablet Take 1 tablet (400 mg total) by  mouth 2 (two) times daily. 01/02/19   [name], DO  metFORMIN (GLUCOPHAGE) 1000 MG tablet TAKE 1 TABLET (1,000 MG TOTAL) BY MOUTH 2 (TWO) TIMES DAILY WITH A MEAL. 07/03/19   [name], DO  metoCLOPramide (REGLAN) 10 MG tablet Take 1 tablet 30 minutes before each half the prep. Patient not taking: Reported on 08/13/2019 07/31/19   Levin Erp, PA  OZEMPIC, 0.25 OR 0.5 MG/DOSE, 2 MG/1.5ML SOPN INJECT 0.5 MG INTO THE SKIN ONCE A WEEK. 08/13/19   [name], DO  rosuvastatin (CRESTOR) 20 MG tablet Take 1 tablet (20 mg total) by mouth 4 (four) times a week. 11/13/18   [name], DO                                                                                                                                    Past Surgical History Past Surgical History:  Procedure Laterality Date  . CARPAL TUNNEL RELEASE Right   . CESAREAN SECTION    . CHOLECYSTECTOMY    . IR PARACENTESIS  08/01/2019  . KNEE ARTHROSCOPY Right 12/10/2015   Procedure: RIGHT KNEE ARTHROSCOPY WITH PARTIAL MEDIAL MENISCECTOMY;  Surgeon: Mcarthur Rossetti, MD;  Location: [facility];  Service: Orthopedics;  Laterality: Right;  . KNEE ARTHROSCOPY W/ MENISCAL REPAIR Bilateral   . MENISCUS REPAIR Left    Family History Family History  Problem Relation Age of Onset  . Hypertension Mother   . COPD Mother   . Alcoholism Father   . Heart attack Father   . Diabetes Brother   . Diabetes Paternal Grandmother   . Heart disease Paternal Grandmother   . Alcoholism Paternal Uncle   . Alcoholism Maternal Uncle   . Heart disease Maternal Grandmother   . Heart disease Maternal Grandfather   . Heart disease Paternal Grandfather     Social History Social History   Tobacco Use  . Smoking status: Never Smoker  . Smokeless tobacco: Never Used  Substance Use Topics  . Alcohol use: No  . Drug use: No   Allergies Penicillins, Atrovent [ipratropium], Naproxen, Quinine derivatives, and Sulfa  antibiotics  Review of Systems Review of Systems All other systems are reviewed and are negative for acute change except as noted in the HPI  Physical Exam Vital Signs  I have reviewed the triage vital signs BP (!) 93/54 (BP Location: Right Arm)   Pulse 63   Temp 97.8 F (36.6 C) (Oral)  Resp 14   LMP 12/03/2013   SpO2 100%   Physical Exam Vitals reviewed.  Constitutional:      General: She is not in acute distress.    Appearance: She is well-developed. She is not diaphoretic.  HENT:     Head: Normocephalic and atraumatic.     Nose: Nose normal.  Eyes:     General: No scleral icterus.       Right eye: No discharge.        Left eye: No discharge.     Conjunctiva/sclera: Conjunctivae normal.     Pupils: Pupils are equal, round, and reactive to light.  Cardiovascular:     Rate and Rhythm: Normal rate and regular rhythm.     Heart sounds: No murmur. No friction rub. No gallop.   Pulmonary:     Effort: Pulmonary effort is normal. No respiratory distress.     Breath sounds: Normal breath sounds. No stridor. No rales.  Abdominal:     General: There is distension.     Palpations: Abdomen is soft. There is fluid wave and hepatomegaly.     Tenderness: There is no abdominal tenderness.  Musculoskeletal:        General: No tenderness.     Cervical back: Normal range of motion and neck supple.  Skin:    General: Skin is warm and dry.     Findings: No erythema or rash.  Neurological:     Mental Status: She is alert and oriented to person, place, and time.     ED Results and Treatments Labs (all labs ordered are listed, but only abnormal results are displayed) Labs Reviewed  BASIC METABOLIC PANEL - Abnormal; Notable for the following components:      Result Value   Sodium 128 (*)    Potassium 6.4 (*)    CO2 15 (*)    BUN 23 (*)    Creatinine, Ser 1.38 (*)    GFR calc non Af Amer 43 (*)    GFR calc Af Amer 49 (*)    All other components within normal limits  CBC -  Abnormal; Notable for the following components:   Hemoglobin 10.7 (*)    HCT 33.5 (*)    RDW 16.1 (*)    Platelets 144 (*)    All other components within normal limits  HEPATIC FUNCTION PANEL  PROTIME-INR  I-STAT BETA HCG BLOOD, ED ([facility], [facility], [facility] ONLY)  TROPONIN I (HIGH SENSITIVITY)  TROPONIN I (HIGH SENSITIVITY)                                                                                                                         EKG  EKG Interpretation  Date/Time:  Thursday August 15 2019 18:33:54 EDT Ventricular Rate:  74 PR Interval:    QRS Duration: 72 QT Interval:  352 QTC Calculation: 390 R Axis:   50 Text Interpretation: Sinus rhythm Low voltage QRS Cannot rule out Anterior infarct , age undetermined Abnormal  ECG Otherwise no significant change Confirmed by Addison Lank [phone]) on 08/15/2019 11:41:17 PM      Radiology No results found.  Pertinent labs & imaging results that were available during my care of the patient were reviewed by me and considered in my medical decision making (see chart for details).  Medications Ordered in ED Medications  sodium chloride flush (NS) 0.9 % injection 3 mL (has no administration in time range)  sodium chloride 0.9 % bolus 500 mL (has no administration in time range)  furosemide (LASIX) injection 40 mg (has no administration in time range)                                                                                                                                    Procedures .Critical Care Performed by: Fatima Blank, MD Authorized by: Fatima Blank, MD    CRITICAL CARE Performed by: Grayce Sessions Mithra Spano Total critical care time: 30 minutes Critical care time was exclusive of separately billable procedures and treating other patients. Critical care was necessary to treat or prevent imminent or life-threatening deterioration. Critical care was time spent personally by me on the following activities:  development of treatment plan with patient and/or surrogate as well as nursing, discussions with consultants, evaluation of patient's response to treatment, examination of patient, obtaining history from patient or surrogate, ordering and performing treatments and interventions, ordering and review of laboratory studies, ordering and review of radiographic studies, pulse oximetry and re-evaluation of patient's condition.    (including critical care time)  Medical Decision Making / ED Course I have reviewed the nursing notes for this encounter and the patient's prior records (if available in EHR or on provided paperwork).   Meagan Peck was evaluated in Emergency Department on 08/16/2019 for the symptoms described in the history of present illness. She was evaluated in the context of the global COVID-19 pandemic, which necessitated consideration that the patient might be at risk for infection with the SARS-CoV-2 virus that causes COVID-19. Institutional protocols and algorithms that pertain to the evaluation of patients at risk for COVID-19 are in a state of rapid change based on information released by regulatory bodies including the CDC and federal and state organizations. These policies and algorithms were followed during the patient's care in the ED.  Patient presents for hyperkalemia. Possible medication related versus hepatorenal syndrome. EKG without peaked T waves or prolonged QRS. Currently she is asymptomatic. Repeat labs notable for slight increase in potassium from 6.2-6.4 earlier in the day.  She is up from 5.2  3 days ago.  Patient also has mild hyponatremia.  Additionally she has AKI.  Patient temporized with IV fluids and Lasix.  Will consult hospitalist service to admit patient for continued work-up and management.      Final Clinical Impression(s) / ED Diagnoses Final diagnoses:  Hyperkalemia      This chart was dictated using voice recognition  software.  Despite best  efforts to proofread,  errors can occur which can change the documentation meaning.   Fatima Blank, MD 08/16/19 [phone]

## 2019-08-16 NOTE — ED Notes (Signed)
Attempted blood draw in right forearm, unsuccessful.

## 2019-08-16 NOTE — Progress Notes (Addendum)
59 yo presenting with hyperkalemia. See H&P for full details. K+ is coming down. She is distended but not tight. Don't think she needs a paracentesis today. Follow up labs. Likely d/c in AM if stable. Get her some bicarb. Otherwise, remainder as per H&P.  UPDATE: K+ is elevated. Give kayexelate 30 x 1.     A/P Hyperkalemia: Hyponatremia: Hepatic cirrhosis with ascites, esophageal varices, and portal hypertensive gastropathy: Type 2 diabetes: Hypertension: Hyperlipidemia:  General: 59 y.o. female resting in bed in NAD Cardiovascular: RRR, +S1, S2, no m/g/r Respiratory: CTABL, no w/r/r, normal WOB GI: BS+, NT, distended but soft, no masses noted, no organomegaly noted MSK: No e/c/c Neuro: alert to name, follows commands Psyc: Appropriate interaction and affect, calm/cooperative   .Jonnie Finner, DO

## 2019-08-16 NOTE — Plan of Care (Signed)
  Problem: Health Behavior/Discharge Planning: Goal: Ability to manage health-related needs will improve Outcome: Progressing   

## 2019-08-17 DIAGNOSIS — E1159 Type 2 diabetes mellitus with other circulatory complications: Secondary | ICD-10-CM

## 2019-08-17 DIAGNOSIS — R188 Other ascites: Secondary | ICD-10-CM

## 2019-08-17 DIAGNOSIS — E1169 Type 2 diabetes mellitus with other specified complication: Secondary | ICD-10-CM

## 2019-08-17 DIAGNOSIS — E871 Hypo-osmolality and hyponatremia: Secondary | ICD-10-CM

## 2019-08-17 DIAGNOSIS — K7460 Unspecified cirrhosis of liver: Principal | ICD-10-CM

## 2019-08-17 DIAGNOSIS — Z794 Long term (current) use of insulin: Secondary | ICD-10-CM

## 2019-08-17 DIAGNOSIS — E785 Hyperlipidemia, unspecified: Secondary | ICD-10-CM

## 2019-08-17 DIAGNOSIS — I10 Essential (primary) hypertension: Secondary | ICD-10-CM

## 2019-08-17 DIAGNOSIS — E119 Type 2 diabetes mellitus without complications: Secondary | ICD-10-CM

## 2019-08-17 LAB — CBC WITH DIFFERENTIAL/PLATELET
Abs Immature Granulocytes: 0.01 10*3/uL (ref 0.00–0.07)
Basophils Absolute: 0 10*3/uL (ref 0.0–0.1)
Basophils Relative: 1 %
Eosinophils Absolute: 0.2 10*3/uL (ref 0.0–0.5)
Eosinophils Relative: 4 %
HCT: 28.5 % — ABNORMAL LOW (ref 36.0–46.0)
Hemoglobin: 9.6 g/dL — ABNORMAL LOW (ref 12.0–15.0)
Immature Granulocytes: 0 %
Lymphocytes Relative: 28 %
Lymphs Abs: 1.3 10*3/uL (ref 0.7–4.0)
MCH: 27.8 pg (ref 26.0–34.0)
MCHC: 33.7 g/dL (ref 30.0–36.0)
MCV: 82.6 fL (ref 80.0–100.0)
Monocytes Absolute: 0.5 10*3/uL (ref 0.1–1.0)
Monocytes Relative: 11 %
Neutro Abs: 2.6 10*3/uL (ref 1.7–7.7)
Neutrophils Relative %: 56 %
Platelets: 109 10*3/uL — ABNORMAL LOW (ref 150–400)
RBC: 3.45 MIL/uL — ABNORMAL LOW (ref 3.87–5.11)
RDW: 16.4 % — ABNORMAL HIGH (ref 11.5–15.5)
WBC: 4.7 10*3/uL (ref 4.0–10.5)
nRBC: 0 % (ref 0.0–0.2)

## 2019-08-17 LAB — RENAL FUNCTION PANEL
Albumin: 2.4 g/dL — ABNORMAL LOW (ref 3.5–5.0)
Anion gap: 8 (ref 5–15)
BUN: 27 mg/dL — ABNORMAL HIGH (ref 6–20)
CO2: 16 mmol/L — ABNORMAL LOW (ref 22–32)
Calcium: 8.5 mg/dL — ABNORMAL LOW (ref 8.9–10.3)
Chloride: 106 mmol/L (ref 98–111)
Creatinine, Ser: 1.64 mg/dL — ABNORMAL HIGH (ref 0.44–1.00)
GFR calc Af Amer: 40 mL/min — ABNORMAL LOW (ref >60)
GFR calc non Af Amer: 35 mL/min — ABNORMAL LOW (ref >60)
Glucose, Bld: 86 mg/dL (ref 70–99)
Phosphorus: 5 mg/dL — ABNORMAL HIGH (ref 2.5–4.6)
Potassium: 5.3 mmol/L — ABNORMAL HIGH (ref 3.5–5.1)
Sodium: 130 mmol/L — ABNORMAL LOW (ref 135–145)

## 2019-08-17 LAB — MAGNESIUM: Magnesium: 1.2 mg/dL — ABNORMAL LOW (ref 1.7–2.4)

## 2019-08-17 LAB — GLUCOSE, CAPILLARY: Glucose-Capillary: 87 mg/dL (ref 70–99)

## 2019-08-17 MED ORDER — ALBUMIN HUMAN 25 % IV SOLN
12.5000 g | Freq: Once | INTRAVENOUS | Status: AC
  Administered 2019-08-17: 12.5 g via INTRAVENOUS
  Filled 2019-08-17: qty 50

## 2019-08-17 MED ORDER — MAGNESIUM SULFATE 2 GM/50ML IV SOLN
2.0000 g | Freq: Once | INTRAVENOUS | Status: AC
  Administered 2019-08-17: 2 g via INTRAVENOUS
  Filled 2019-08-17: qty 50

## 2019-08-17 MED ORDER — SODIUM ZIRCONIUM CYCLOSILICATE 10 G PO PACK
10.0000 g | PACK | Freq: Two times a day (BID) | ORAL | Status: AC
  Administered 2019-08-17 – 2019-08-18 (×4): 10 g via ORAL
  Filled 2019-08-17 (×4): qty 1

## 2019-08-17 MED ORDER — SODIUM CHLORIDE 0.9 % IV BOLUS
250.0000 mL | Freq: Once | INTRAVENOUS | Status: AC
  Administered 2019-08-17: 250 mL via INTRAVENOUS

## 2019-08-17 NOTE — Plan of Care (Signed)
  Problem: Clinical Measurements: Goal: Respiratory complications will improve Outcome: Progressing   Problem: Safety: Goal: Ability to remain free from injury will improve Outcome: Progressing   

## 2019-08-17 NOTE — Progress Notes (Signed)
[facility]  PROGRESS NOTE    Meagan Peck  ZOX:[id number] DOB: Mar 21, 1961 DOA: 08/15/2019 PCP: [name], DO   Brief Narrative:   Meagan Peck is a 59 y.o. female with medical history significant for hepatic cirrhosis with ascites, esophageal varices, and portal hypertensive gastropathy, type 2 diabetes, hypertension, hyperlipidemia, iron deficiency anemia who presents to the ED for evaluation of abnormal labs.  Patient is being evaluated for recently found hepatic cirrhosis by her gastroenterology team.   On 07/31/2018 when she was started on diuretic therapy with spironolactone 50 mg daily and Lasix 40 mg daily for management of her ascites.   She underwent paracentesis on 08/01/2019 with 10 L of peritoneal fluid removed.  Cytology was negative for malignant cells and culture no growth.  On 08/13/2019 she underwent EGD which showed large esophageal varices and small gastroesophageal varices.  Colonoscopy showed few small benign-appearing nonbleeding colonic angiodysplastic lesions and internal hemorrhoids.  She was plan to undergo gastric varices banding on 08/19/2019.  She had labs on 08/13/2019 which showed mildly increased potassium of 5.2.  She was advised to increase her Lasix to 40 mg twice a day and continue spironolactone 50 mg daily.  She had repeat lab work performed on 08/15/2019 which showed an increase potassium to 6.2.  She was advised to discontinue her spironolactone and come to the ED for further evaluation.  She currently reports beginning to have recurrent swelling of her abdomen but denies any abdominal pain.  She denies any shortness of breath, cough, chest pain, palpitations, nausea, vomiting, dysuria, diarrhea, or constipation.    4/17: K+ is improved. Start scheduled lokelma. She needs Mg2+ today. She is hypotensive. Give small bolus of fluid. May need to hold next dose of lasix. Could also try midodrine, but obviously her liver fxn is not great.    Assessment &  Plan:   Principal Problem:   Hyperkalemia Active Problems:   Hyperlipidemia associated with type 2 diabetes mellitus ([facility])   Type 2 diabetes mellitus without complication, with long-term current use of insulin ([facility])   Hypertension associated with diabetes ([place])   Cirrhosis ([facility])  Hyperkalemia:     - K+ 6.4 on admission in the setting of spironolactone, lisinopril, and AKI.     - was given 3m lasix PO x 1 and started on 411mqday after     - was given lokelma 1067m 1     - 4/17: gave kayexelate 54m39msterday and her K+ has improved to 5.3 this AM, will now schedule lokelma for 48 hrs  Hyponatremia: Hypomagnesemia     - Mild in the setting of cirrhosis with ascites.       - She was given 500 cc normal saline in the ED.     - 4/17: She is up to 130 today; getting a small bolus today d/t hypotension, watch Na+; add Mg2+, monitor  Hepatic cirrhosis with ascites, esophageal varices, and portal hypertensive gastropathy:     - S/p paracentesis 08/01/2019 with 10 L removed.       - Abdomen distended again with ascites on examination.  Otherwise no evidence of hepatic encephalopathy or decompensation.     - Lasix as above, holding spironolactone due to hyperkalemia     - Follow-up with GI, plan for gastric varices banding on 08/19/2019  Type 2 diabetes:     - On Metformin and Ozempic as an outpatient.       - Serum glucose 70 on admission.     -  4/17: glucose is ok this AM, poor appetite, monitor  Hypertension:     - Hypotensive on arrival.       - Holding spironolactone and lisinopril as above.     - 4/17: still hypotensive today; give small bolus, will hold lasix  Hyperlipidemia:     - On rosuvastatin as an outpatient, currently on hold.   DVT prophylaxis: heparin Code Status: FULL   Status is: Inpatient  Remains inpatient appropriate because:Inpatient level of care appropriate due to severity of illness   Dispo: The patient is from: Home              Anticipated d/c is  to: Home              Anticipated d/c date is: 3 days              Patient currently is not medically stable to d/c.  ROS:  Reports fatigue. Denies CP, N, ab pain . Remainder 10-pt ROS is negative for all not previously mentioned.  Subjective: "They were going to do it on Monday."  Objective: Vitals:   08/16/19 2156 08/16/19 2201 08/17/19 0424 08/17/19 0934  BP: (!) 75/51 (!) 90/57 (!) 90/51 (!) 84/47  Pulse: 79 75 72 79  Resp: 18   18  Temp: 98.7 F (37.1 C)  98.8 F (37.1 C) 98.7 F (37.1 C)  TempSrc:    Oral  SpO2: 98%  99% 98%  Weight:      Height:        Intake/Output Summary (Last 24 hours) at 08/17/2019 1513 Last data filed at 08/17/2019 1500 Gross per 24 hour  Intake 747.65 ml  Output 0 ml  Net 747.65 ml   Filed Weights   08/16/19 0354 08/16/19 0407  Weight: 66.1 kg 65.9 kg    Examination:  General: 59 y.o. female resting in bed in NAD Cardiovascular: RRR, +S1, S2, no m/g/r, equal pulses throughout Respiratory: CTABL, no w/r/r, normal WOB GI: BS+, distended but soft, NT MSK: No e/c/c Neuro: A&O x 3, no focal deficits Psyc: Appropriate interaction and affect, calm/cooperative  Data Reviewed: I have personally reviewed following labs and imaging studies.  CBC: Recent Labs  Lab 08/15/19 1915 08/16/19 0342 08/17/19 0556  WBC 6.3 4.9 4.7  NEUTROABS  --   --  2.6  HGB 10.7* 9.9* 9.6*  HCT 33.5* 31.1* 28.5*  MCV 83.8 84.7 82.6  PLT 144* PLATELET CLUMPS NOTED ON SMEAR, UNABLE TO ESTIMATE 767*   Basic Metabolic Panel: Recent Labs  Lab 08/15/19 1255 08/15/19 1915 08/16/19 0342 08/16/19 1408 08/17/19 0556  NA 126* 128* 128* 128* 130*  K 6.2 No hemolysis seen* 6.4* 5.5* 6.3* 5.3*  CL 102 104 104 104 106  CO2 18* 15* 15* 17* 16*  GLUCOSE 67* 70 73 87 86  BUN 25* 23* 23* 24* 27*  CREATININE 1.26* 1.38* 1.19* 1.31* 1.64*  CALCIUM 9.1 9.3 8.7* 8.6* 8.5*  MG  --   --   --   --  1.2*  PHOS  --   --   --  5.1* 5.0*   GFR: Estimated Creatinine  Clearance: 31.2 mL/min (A) (by C-G formula based on SCr of 1.64 mg/dL (H)). Liver Function Tests: Recent Labs  Lab 08/16/19 0014 08/16/19 1408 08/17/19 0556  AST 38  --   --   ALT 22  --   --   ALKPHOS 63  --   --   BILITOT 1.7*  --   --  PROT 6.8  --   --   ALBUMIN 3.0* 2.4* 2.4*   No results for input(s): LIPASE, AMYLASE in the last 168 hours. No results for input(s): AMMONIA in the last 168 hours. Coagulation Profile: Recent Labs  Lab 08/16/19 0014  INR 1.3*   Cardiac Enzymes: No results for input(s): CKTOTAL, CKMB, CKMBINDEX, TROPONINI in the last 168 hours. BNP (last 3 results) No results for input(s): PROBNP in the last 8760 hours. HbA1C: No results for input(s): HGBA1C in the last 72 hours. CBG: Recent Labs  Lab 08/16/19 0442 08/16/19 0647 08/16/19 1119 08/16/19 2117 08/17/19 0644  GLUCAP 74 99 84 99 87   Lipid Profile: No results for input(s): CHOL, HDL, LDLCALC, TRIG, CHOLHDL, LDLDIRECT in the last 72 hours. Thyroid Function Tests: No results for input(s): TSH, T4TOTAL, FREET4, T3FREE, THYROIDAB in the last 72 hours. Anemia Panel: No results for input(s): VITAMINB12, FOLATE, FERRITIN, TIBC, IRON, RETICCTPCT in the last 72 hours. Sepsis Labs: No results for input(s): PROCALCITON, LATICACIDVEN in the last 168 hours.  Recent Results (from the past 240 hour(s))  SARS Coronavirus 2 (TAT 6-24 hrs)     Status: None   Collection Time: 08/09/19 12:00 AM  Result Value Ref Range Status   SARS Coronavirus 2 RESULT: NEGATIVE  Final    Comment: RESULT: NEGATIVESARS-CoV-2 INTERPRETATION:A NEGATIVE  test result means that SARS-CoV-2 RNA was not present in the specimen above the limit of detection of this test. This does not preclude a possible SARS-CoV-2 infection and should not be used as the  sole basis for patient management decisions. Negative results must be combined with clinical observations, patient history, and epidemiological information. Optimum specimen  types and timing for peak viral levels during infections caused by SARS-CoV-2  have not been determined. Collection of multiple specimens or types of specimens may be necessary to detect virus. Improper specimen collection and handling, sequence variability under primers/probes, or organism present below the limit of detection may  lead to false negative results. Positive and negative predictive values of testing are highly dependent on prevalence. False negative test results are more likely when prevalence of disease is high.The expected result is NEGATIVE.Fact S heet for  Healthcare Providers: LocalChronicle.no Sheet for Patients: SalonLookup.es Reference Range - Negative   SARS CORONAVIRUS 2 (TAT 6-24 HRS) Nasopharyngeal Nasopharyngeal Swab     Status: None   Collection Time: 08/15/19 11:21 AM   Specimen: Nasopharyngeal Swab  Result Value Ref Range Status   SARS Coronavirus 2 NEGATIVE NEGATIVE Final    Comment: (NOTE) SARS-CoV-2 target nucleic acids are NOT DETECTED. The SARS-CoV-2 RNA is generally detectable in upper and lower respiratory specimens during the acute phase of infection. Negative results do not preclude SARS-CoV-2 infection, do not rule out co-infections with other pathogens, and should not be used as the sole basis for treatment or other patient management decisions. Negative results must be combined with clinical observations, patient history, and epidemiological information. The expected result is Negative. Fact Sheet for Patients: SugarRoll.be Fact Sheet for Healthcare Providers: https://www.woods-mathews.com/ This test is not yet approved or cleared by the [place] FDA and  has been authorized for detection and/or diagnosis of SARS-CoV-2 by FDA under an Emergency Use Authorization (EUA). This EUA will remain  in effect (meaning this test can be used) for the  duration of the COVID-19 declaration under Section 56 4(b)(1) of the Act, 21 U.S.C. section 360bbb-3(b)(1), unless the authorization is terminated or revoked sooner. Performed at [facility], [place]  [address], [place], [place] [address]       Radiology Studies: No results found.   Scheduled Meds: . escitalopram  10 mg Oral QHS  . furosemide  40 mg Oral Daily  . heparin  5,000 Units Subcutaneous Q8H  . sodium bicarbonate  650 mg Oral TID  . sodium chloride flush  3 mL Intravenous Q12H  . sodium zirconium cyclosilicate  10 g Oral BID   Continuous Infusions:   LOS: 1 day    Time spent: 25 minutes spent in the coordination of care today.    Jonnie Finner, DO [facility]  If 7PM-7AM, please contact night-coverage [email or web address] 08/17/2019, 3:13 PM

## 2019-08-18 LAB — COMPREHENSIVE METABOLIC PANEL
ALT: 15 U/L (ref 0–44)
AST: 28 U/L (ref 15–41)
Albumin: 2.4 g/dL — ABNORMAL LOW (ref 3.5–5.0)
Alkaline Phosphatase: 51 U/L (ref 38–126)
Anion gap: 8 (ref 5–15)
BUN: 24 mg/dL — ABNORMAL HIGH (ref 6–20)
CO2: 18 mmol/L — ABNORMAL LOW (ref 22–32)
Calcium: 8.3 mg/dL — ABNORMAL LOW (ref 8.9–10.3)
Chloride: 107 mmol/L (ref 98–111)
Creatinine, Ser: 0.93 mg/dL (ref 0.44–1.00)
GFR calc Af Amer: >60 mL/min (ref >60)
GFR calc non Af Amer: >60 mL/min (ref >60)
Glucose, Bld: 76 mg/dL (ref 70–99)
Potassium: 4.5 mmol/L (ref 3.5–5.1)
Sodium: 133 mmol/L — ABNORMAL LOW (ref 135–145)
Total Bilirubin: 1.1 mg/dL (ref 0.3–1.2)
Total Protein: 5.3 g/dL — ABNORMAL LOW (ref 6.5–8.1)

## 2019-08-18 LAB — CBC WITH DIFFERENTIAL/PLATELET
Abs Immature Granulocytes: 0.01 10*3/uL (ref 0.00–0.07)
Basophils Absolute: 0 10*3/uL (ref 0.0–0.1)
Basophils Relative: 1 %
Eosinophils Absolute: 0.1 10*3/uL (ref 0.0–0.5)
Eosinophils Relative: 3 %
HCT: 27.1 % — ABNORMAL LOW (ref 36.0–46.0)
Hemoglobin: 9.1 g/dL — ABNORMAL LOW (ref 12.0–15.0)
Immature Granulocytes: 0 %
Lymphocytes Relative: 34 %
Lymphs Abs: 1.2 10*3/uL (ref 0.7–4.0)
MCH: 27.3 pg (ref 26.0–34.0)
MCHC: 33.6 g/dL (ref 30.0–36.0)
MCV: 81.4 fL (ref 80.0–100.0)
Monocytes Absolute: 0.4 10*3/uL (ref 0.1–1.0)
Monocytes Relative: 11 %
Neutro Abs: 1.8 10*3/uL (ref 1.7–7.7)
Neutrophils Relative %: 51 %
Platelets: 87 10*3/uL — ABNORMAL LOW (ref 150–400)
RBC: 3.33 MIL/uL — ABNORMAL LOW (ref 3.87–5.11)
RDW: 16.2 % — ABNORMAL HIGH (ref 11.5–15.5)
WBC: 3.5 10*3/uL — ABNORMAL LOW (ref 4.0–10.5)
nRBC: 0 % (ref 0.0–0.2)

## 2019-08-18 LAB — GLUCOSE, CAPILLARY: Glucose-Capillary: 71 mg/dL (ref 70–99)

## 2019-08-18 LAB — MAGNESIUM: Magnesium: 1.6 mg/dL — ABNORMAL LOW (ref 1.7–2.4)

## 2019-08-18 MED ORDER — OXYCODONE HCL 5 MG PO TABS
5.0000 mg | ORAL_TABLET | Freq: Once | ORAL | Status: AC
  Administered 2019-08-18: 5 mg via ORAL
  Filled 2019-08-18: qty 1

## 2019-08-18 MED ORDER — MAGNESIUM OXIDE 400 (241.3 MG) MG PO TABS
400.0000 mg | ORAL_TABLET | Freq: Two times a day (BID) | ORAL | Status: DC
  Administered 2019-08-18 – 2019-08-19 (×3): 400 mg via ORAL
  Filled 2019-08-18 (×4): qty 1

## 2019-08-18 MED ORDER — SODIUM CHLORIDE 0.9 % IV BOLUS
500.0000 mL | Freq: Once | INTRAVENOUS | Status: AC
  Administered 2019-08-18: 500 mL via INTRAVENOUS

## 2019-08-18 MED ORDER — MEGESTROL ACETATE 40 MG PO TABS
40.0000 mg | ORAL_TABLET | Freq: Every day | ORAL | Status: DC
  Administered 2019-08-18 – 2019-08-19 (×2): 40 mg via ORAL
  Filled 2019-08-18 (×2): qty 1

## 2019-08-18 NOTE — Progress Notes (Signed)
Marland Kitchen  PROGRESS NOTE    Meagan Peck  PFX:[id number] DOB: March 02, 1961 DOA: 08/15/2019 PCP: [name], DO   Brief Narrative:   Meagan Peck a 59 y.o.femalewith medical history significant forhepatic cirrhosis with ascites, esophageal varices, and portal hypertensive gastropathy, type 2 diabetes, hypertension, hyperlipidemia, iron deficiency anemia who presents to the ED for evaluation of abnormal labs.  Patient is being evaluated for recently found hepatic cirrhosis by her gastroenterology team.  On 07/31/2018 when she was started on diuretic therapy with spironolactone 50 mg daily and Lasix 40 mg daily for management of her ascites.  She underwent paracentesis on 08/01/2019 with 10 L of peritoneal fluid removed. Cytology was negative for malignant cells and culture no growth.  On 08/13/2019 she underwent EGD which showed large esophageal varices and small gastroesophageal varices. Colonoscopy showed few small benign-appearing nonbleeding colonic angiodysplastic lesions and internal hemorrhoids. She was plan to undergo gastric varices banding on 08/19/2019.  She had labs on 08/13/2019 which showed mildly increased potassium of 5.2. She was advised to increase her Lasix to 40 mg twice a day and continue spironolactone 50 mg daily. She had repeat lab work performed on 08/15/2019 which showed an increase potassium to 6.2. She was advised to discontinue her spironolactone and come to the ED for further evaluation.  She currently reports beginning to have recurrent swelling of her abdomen but denies any abdominal pain. She denies any shortness of breath, cough, chest pain, palpitations, nausea, vomiting, dysuria, diarrhea, or constipation.  4/18: K+ is improved. Lokelma ends tonight. Still holding aldactone. Can resume lasix in AM if BP better. She got fluids again today d/t low BP. Glucose is borderline and she has poor appetite. Add megace. Was supposed to go to [facility] for esophageal  banding w/ GI. Not gonna make it tomorrow. Have asked GI to eval her here. Appreciate their time.    Assessment & Plan:   Principal Problem:   Hyperkalemia Active Problems:   Hyperlipidemia associated with type 2 diabetes mellitus (HCC)   Type 2 diabetes mellitus without complication, with long-term current use of insulin (HCC)   Hypertension associated with diabetes ([place])   Cirrhosis (HCC)  Hyperkalemia:     - K+ 6.4 on admission in the setting of spironolactone, lisinopril, and AKI.     - was given 9m lasix PO x 1 and started on 441mqday after     - was given lokelma 108m 1     - 4/17: gave kayexelate 27m66msterday and her K+ has improved to 5.3 this AM, will now schedule lokelma for 48 hrs     - 4/18: K+ is resolved. Can resume daily lasix tomorrow if BP improved  Hyponatremia: Hypomagnesemia     - Mild in the setting of cirrhosis with ascites.       - She was given 500 cc normal saline in the ED.     - 4/17: She is up to 130 today; getting a small bolus today d/t hypotension, watch Na+; add Mg2+, monitor     - 4/18: Mg2+ is 1.6. Give MagOx  Hepatic cirrhosis with ascites, esophageal varices, and portal hypertensive gastropathy:     - S/p paracentesis 08/01/2019 with 10 L removed.       - Abdomen distended again with ascites on examination.  Otherwise no evidence of hepatic encephalopathy or decompensation.     - Lasix as above, holding spironolactone due to hyperkalemia     - Follow-up with GI, plan for  gastric varices banding on 08/19/2019     - 4/18: GI will eval her in the AM  Type 2 diabetes:     - On Metformin and Ozempic as an outpatient.       - Serum glucose 70 on admission.     - 4/18: Glucoses are still border line. Will give megace as appetite stimulant.   Hypertension:     - Hypotensive on arrival.       - Holding spironolactone and lisinopril as above.     - 4/17: still hypotensive today; give small bolus, will hold lasix     - 4/18: BP is stable.  Got fluids today. She seems to be on a chronically low side, she denies complaints today.   Hyperlipidemia:     - On rosuvastatin as an outpatient, currently on hold.  DVT prophylaxis: heparin Code Status: FULL   Status is: Inpatient  Remains inpatient appropriate because:Inpatient level of care appropriate due to severity of illness   Dispo: The patient is from: Home              Anticipated d/c is to: Home              Anticipated d/c date is: 2 days              Patient currently is not medically stable to d/c.  Consultants:   GI   ROS:  Denies N, V, ab pain. Reports poor appetite . Remainder 10-pt ROS is negative for all not previously mentioned.  Subjective: "I'm just not hungry."  Objective: Vitals:   08/17/19 2127 08/18/19 0544 08/18/19 1014 08/18/19 1519  BP: 93/62 (!) 89/53 (!) 86/50 96/66  Pulse: 74 73 70   Resp: 18 18 18    Temp: 97.9 F (36.6 C) 97.7 F (36.5 C) 98 F (36.7 C)   TempSrc: Oral Oral Oral   SpO2: 100% 97% 100%   Weight: 65.9 kg     Height:        Intake/Output Summary (Last 24 hours) at 08/18/2019 1604 Last data filed at 08/18/2019 0900 Gross per 24 hour  Intake 240 ml  Output 0 ml  Net 240 ml   Filed Weights   08/16/19 0354 08/16/19 0407 08/17/19 2127  Weight: 66.1 kg 65.9 kg 65.9 kg    Examination:  General: 59 y.o. female resting in bed in NAD Cardiovascular: RRR, +S1, S2, no m/g/rt Respiratory: CTABL, no w/r/r, normal WOB GI: BS+, NT, distended but soft, no masses noted MSK: No e/c/c Neuro: A&O x 3, no focal deficits Psyc: Appropriate interaction and affect, calm/cooperative   Data Reviewed: I have personally reviewed following labs and imaging studies.  CBC: Recent Labs  Lab 08/15/19 1915 08/16/19 0342 08/17/19 0556 08/18/19 0519  WBC 6.3 4.9 4.7 3.5*  NEUTROABS  --   --  2.6 1.8  HGB 10.7* 9.9* 9.6* 9.1*  HCT 33.5* 31.1* 28.5* 27.1*  MCV 83.8 84.7 82.6 81.4  PLT 144* PLATELET CLUMPS NOTED ON SMEAR, UNABLE TO  ESTIMATE 109* 87*   Basic Metabolic Panel: Recent Labs  Lab 08/15/19 1915 08/16/19 0342 08/16/19 1408 08/17/19 0556 08/18/19 0519  NA 128* 128* 128* 130* 133*  K 6.4* 5.5* 6.3* 5.3* 4.5  CL 104 104 104 106 107  CO2 15* 15* 17* 16* 18*  GLUCOSE 70 73 87 86 76  BUN 23* 23* 24* 27* 24*  CREATININE 1.38* 1.19* 1.31* 1.64* 0.93  CALCIUM 9.3 8.7* 8.6* 8.5* 8.3*  MG  --   --   --  1.2* 1.6*  PHOS  --   --  5.1* 5.0*  --    GFR: Estimated Creatinine Clearance: 55 mL/min (by C-G formula based on SCr of 0.93 mg/dL). Liver Function Tests: Recent Labs  Lab 08/16/19 0014 08/16/19 1408 08/17/19 0556 08/18/19 0519  AST 38  --   --  28  ALT 22  --   --  15  ALKPHOS 63  --   --  51  BILITOT 1.7*  --   --  1.1  PROT 6.8  --   --  5.3*  ALBUMIN 3.0* 2.4* 2.4* 2.4*   No results for input(s): LIPASE, AMYLASE in the last 168 hours. No results for input(s): AMMONIA in the last 168 hours. Coagulation Profile: Recent Labs  Lab 08/16/19 0014  INR 1.3*   Cardiac Enzymes: No results for input(s): CKTOTAL, CKMB, CKMBINDEX, TROPONINI in the last 168 hours. BNP (last 3 results) No results for input(s): PROBNP in the last 8760 hours. HbA1C: No results for input(s): HGBA1C in the last 72 hours. CBG: Recent Labs  Lab 08/16/19 0647 08/16/19 1119 08/16/19 2117 08/17/19 0644 08/18/19 0706  GLUCAP 99 84 99 87 71   Lipid Profile: No results for input(s): CHOL, HDL, LDLCALC, TRIG, CHOLHDL, LDLDIRECT in the last 72 hours. Thyroid Function Tests: No results for input(s): TSH, T4TOTAL, FREET4, T3FREE, THYROIDAB in the last 72 hours. Anemia Panel: No results for input(s): VITAMINB12, FOLATE, FERRITIN, TIBC, IRON, RETICCTPCT in the last 72 hours. Sepsis Labs: No results for input(s): PROCALCITON, LATICACIDVEN in the last 168 hours.  Recent Results (from the past 240 hour(s))  SARS Coronavirus 2 (TAT 6-24 hrs)     Status: None   Collection Time: 08/09/19 12:00 AM  Result Value Ref Range  Status   SARS Coronavirus 2 RESULT: NEGATIVE  Final    Comment: RESULT: NEGATIVESARS-CoV-2 INTERPRETATION:A NEGATIVE  test result means that SARS-CoV-2 RNA was not present in the specimen above the limit of detection of this test. This does not preclude a possible SARS-CoV-2 infection and should not be used as the  sole basis for patient management decisions. Negative results must be combined with clinical observations, patient history, and epidemiological information. Optimum specimen types and timing for peak viral levels during infections caused by SARS-CoV-2  have not been determined. Collection of multiple specimens or types of specimens may be necessary to detect virus. Improper specimen collection and handling, sequence variability under primers/probes, or organism present below the limit of detection may  lead to false negative results. Positive and negative predictive values of testing are highly dependent on prevalence. False negative test results are more likely when prevalence of disease is high.The expected result is NEGATIVE.Fact S heet for  Healthcare Providers: LocalChronicle.no Sheet for Patients: SalonLookup.es Reference Range - Negative   SARS CORONAVIRUS 2 (TAT 6-24 HRS) Nasopharyngeal Nasopharyngeal Swab     Status: None   Collection Time: 08/15/19 11:21 AM   Specimen: Nasopharyngeal Swab  Result Value Ref Range Status   SARS Coronavirus 2 NEGATIVE NEGATIVE Final    Comment: (NOTE) SARS-CoV-2 target nucleic acids are NOT DETECTED. The SARS-CoV-2 RNA is generally detectable in upper and lower respiratory specimens during the acute phase of infection. Negative results do not preclude SARS-CoV-2 infection, do not rule out co-infections with other pathogens, and should not be used as the sole basis for treatment or other patient management decisions. Negative results must be combined with clinical observations, patient  history, and epidemiological information. The expected result is Negative. Fact  Sheet for Patients: SugarRoll.be Fact Sheet for Healthcare Providers: https://www.woods-mathews.com/ This test is not yet approved or cleared by the [place] FDA and  has been authorized for detection and/or diagnosis of SARS-CoV-2 by FDA under an Emergency Use Authorization (EUA). This EUA will remain  in effect (meaning this test can be used) for the duration of the COVID-19 declaration under Section 56 4(b)(1) of the Act, 21 U.S.C. section 360bbb-3(b)(1), unless the authorization is terminated or revoked sooner. Performed at [facility], [place] [address] [place], [place] [address]       Radiology Studies: No results found.   Scheduled Meds: . escitalopram  10 mg Oral QHS  . heparin  5,000 Units Subcutaneous Q8H  . sodium bicarbonate  650 mg Oral TID  . sodium chloride flush  3 mL Intravenous Q12H  . sodium zirconium cyclosilicate  10 g Oral BID   Continuous Infusions:   LOS: 2 days    Time spent: 25 minutes spent in the coordination of care today.    Jonnie Finner, DO [facility]  If 7PM-7AM, please contact night-coverage [email or web address] 08/18/2019, 4:04 PM

## 2019-08-19 ENCOUNTER — Inpatient Hospital Stay (HOSPITAL_COMMUNITY): Payer: No Typology Code available for payment source

## 2019-08-19 ENCOUNTER — Ambulatory Visit (HOSPITAL_COMMUNITY)
Admission: RE | Admit: 2019-08-19 | Payer: No Typology Code available for payment source | Source: Home / Self Care | Admitting: Gastroenterology

## 2019-08-19 HISTORY — PX: IR PARACENTESIS: IMG2679

## 2019-08-19 LAB — RENAL FUNCTION PANEL
Albumin: 2.5 g/dL — ABNORMAL LOW (ref 3.5–5.0)
Anion gap: 10 (ref 5–15)
BUN: 20 mg/dL (ref 6–20)
CO2: 19 mmol/L — ABNORMAL LOW (ref 22–32)
Calcium: 8.2 mg/dL — ABNORMAL LOW (ref 8.9–10.3)
Chloride: 102 mmol/L (ref 98–111)
Creatinine, Ser: 0.91 mg/dL (ref 0.44–1.00)
GFR calc Af Amer: >60 mL/min (ref >60)
GFR calc non Af Amer: >60 mL/min (ref >60)
Glucose, Bld: 155 mg/dL — ABNORMAL HIGH (ref 70–99)
Phosphorus: 2.3 mg/dL — ABNORMAL LOW (ref 2.5–4.6)
Potassium: 4.7 mmol/L (ref 3.5–5.1)
Sodium: 131 mmol/L — ABNORMAL LOW (ref 135–145)

## 2019-08-19 LAB — MAGNESIUM: Magnesium: 1.3 mg/dL — ABNORMAL LOW (ref 1.7–2.4)

## 2019-08-19 MED ORDER — ALBUMIN HUMAN 25 % IV SOLN
12.5000 g | Freq: Once | INTRAVENOUS | Status: AC
  Administered 2019-08-19: 12.5 g via INTRAVENOUS
  Filled 2019-08-19: qty 50

## 2019-08-19 MED ORDER — LIDOCAINE HCL 1 % IJ SOLN
INTRAMUSCULAR | Status: AC
  Filled 2019-08-19: qty 20

## 2019-08-19 MED ORDER — PANTOPRAZOLE SODIUM 40 MG PO TBEC
40.0000 mg | DELAYED_RELEASE_TABLET | Freq: Once | ORAL | Status: AC
  Administered 2019-08-19: 40 mg via ORAL
  Filled 2019-08-19: qty 1

## 2019-08-19 MED ORDER — KETOROLAC TROMETHAMINE 15 MG/ML IJ SOLN
15.0000 mg | Freq: Once | INTRAMUSCULAR | Status: AC
  Administered 2019-08-19: 15 mg via INTRAVENOUS
  Filled 2019-08-19: qty 1

## 2019-08-19 MED ORDER — MEGESTROL ACETATE 400 MG/10ML PO SUSP
400.0000 mg | Freq: Every day | ORAL | Status: DC
  Administered 2019-08-19: 400 mg via ORAL
  Filled 2019-08-19 (×2): qty 10

## 2019-08-19 NOTE — Consult Note (Addendum)
[facility] Consult: 11:29 AM 08/19/2019  LOS: 3 days    Referring Provider: Dr Marylyn Ishihara  Primary Care Physician:  [name], DO Primary Gastroenterologist:  Dr. Havery Moros    Reason for Consultation:  Esophageal varices, cirrhosis, ascites, hyperkalemia.     HPI: Meagan Peck is a 59 y.o. female.  Until recently she had a diagnosis of NIDDM, iron deficiency anemia.  Previous surgeries include cholecystectomy, knee surgeries, carpal tunnel release, C-section.    Abdominal and lower extremity swelling with 33# weight gain starting January 2021.  PMD worked up and ultrasound showed cirrhosis, large volume ascites, splenomegaly, possible portal hypertension.  Echocardiogram w normal LVEF.  Labs for follow-up of iron deficiency showed stable Hb at 10.6 and improved iron parameters. LFTs never elevated.  Had no previous abdominal imaging until 07/2019 ultrasound.    Seen for initial GI visit on 07/31/2019. Hepatitis serologies negative/nonreactive. Negative/unrevealing studies: Ceruloplasmin, smooth muscle antibody, mitochondrial IgG, alpha 1 antitrypsin (elevated), IgG 1618 (RR [phone]) ANA positive, titre 1:80, nuclear speckled patern AFP 2.3  08/01/2019 paracentesis: 10 liters removed.  Received dose Albumin post tap.  Albumin less than 1.  WBCs but no organisms on Gram stain.  No growth on culture.  Cell count/differential negative for SBP.  She lost 28#after the paracentesis 08/13/19 EGD w Multiple columns large mid and lower esophageal varices, erythematous near GE junction.  Atrophic/congested mucosa throughout the stomach suspect due to portal hypertension gastropathy, biopsies obtained.  Suspicion of small, type II gastroesophageal varices in the cardia. Pathology: chronic inactive gastritis, no H.  pylori, dysplasia, malignancy. 08/13/2019 colonoscopy.  A few small, nonbleeding sigmoid, descending, IC valve AVMs, were not treated or ablated.  Small internal hemorrhoids.  She had been set up for hospital-based outpatient banding of esophageal varices tomorrow with Dr. Rachael Darby.  This is now rescheduled for Dr. Lyndel Safe at [facility] tomorrow morning.  Dr. Havery Moros has been managing diuretics of Lasix and Aldactone.  She developed hyperkalemia.  This did not improve despite increasing dose of Lasix to 40 bid, continue Aldactone 50/day.  As of 4/15 he planned to stop Aldactone but sent her to ED for [facility] for potassium 6.2, up from 5.2 two days prior.  Also dx AKI: 27/1.6, improved today.  Hyponatremia 126 >> 133. LFTs normal.      No family history of liver disease, colorectal cancer, inflammatory bowel disease, autoimmune disorders, ulcers, GI bleeding.  The predominant medical issues in close relatives is heart disease and COPD.  Works as a Careers information officer.  Past Medical History:  Diagnosis Date  . Allergy   . Arthritis   . Cataract   . Diabetes mellitus without complication ([place])   . GERD (gastroesophageal reflux disease)   . Hypertension   . Iron deficiency anemia 09/26/2018    Past Surgical History:  Procedure Laterality Date  . CARPAL TUNNEL RELEASE Right   . CESAREAN SECTION    . CHOLECYSTECTOMY    . IR PARACENTESIS  08/01/2019  . KNEE ARTHROSCOPY Right 12/10/2015   Procedure: RIGHT KNEE ARTHROSCOPY WITH PARTIAL MEDIAL  MENISCECTOMY;  Surgeon: Mcarthur Rossetti, MD;  Location: [facility];  Service: Orthopedics;  Laterality: Right;  . KNEE ARTHROSCOPY W/ MENISCAL REPAIR Bilateral   . MENISCUS REPAIR Left     Prior to Admission medications   Medication Sig Start Date End Date Taking? Authorizing Provider  aspirin 81 MG tablet Take 81 mg by mouth daily.   Yes [provider]  cetirizine (ZYRTEC) 10 MG tablet Take 1 tablet (10 mg total) by mouth daily. 01/02/19  Yes  [name], DO  Cholecalciferol (VITAMIN D3) 5000 UNITS TABS Take 5,000 Units by mouth daily.    Yes [provider]  diclofenac sodium (VOLTAREN) 1 % GEL Apply 4 g topically 4 (four) times daily. To affected joint. Patient taking differently: Apply 4 g topically 4 (four) times daily as needed (For knee pain).  10/05/17  Yes Gregor Hams, MD  escitalopram (LEXAPRO) 10 MG tablet TAKE 1 TABLET (10 MG TOTAL) BY MOUTH EVERY EVENING. Patient taking differently: Take 10 mg by mouth at bedtime.  05/31/19  Yes [name], DO  ferrous sulfate 325 (65 FE) MG EC tablet Take 1 tablet (325 mg total) by mouth 2 (two) times daily with a meal. 09/26/18  Yes [name], DO  furosemide (LASIX) 40 MG tablet Take 1 tablet (40 mg total) by mouth daily. 07/31/19  Yes Levin Erp, PA  ibuprofen (ADVIL,MOTRIN) 800 MG tablet Take 1 tablet (800 mg total) by mouth every 8 (eight) hours as needed for moderate pain. 05/15/17  Yes [name], DO  Krill Oil 500 MG CAPS Take 500 mg by mouth daily.   Yes [provider]  lisinopril (ZESTRIL) 40 MG tablet TAKE 1 TABLET (40 MG TOTAL) BY MOUTH DAILY. 05/31/19  Yes [name], DO  magnesium oxide (MAG-OX) 400 MG tablet Take 1 tablet (400 mg total) by mouth 2 (two) times daily. 01/02/19  Yes [name], DO  metFORMIN (GLUCOPHAGE) 1000 MG tablet TAKE 1 TABLET (1,000 MG TOTAL) BY MOUTH 2 (TWO) TIMES DAILY WITH A MEAL. 07/03/19  Yes Alexander, Natalie, DO  OZEMPIC, 0.25 OR 0.5 MG/DOSE, 2 MG/1.5ML SOPN INJECT 0.5 MG INTO THE SKIN ONCE A WEEK. Patient taking differently: Inject 0.5 mg into the skin every Sunday.  08/13/19  Yes [name], DO  rosuvastatin (CRESTOR) 20 MG tablet Take 1 tablet (20 mg total) by mouth 4 (four) times a week. Patient taking differently: Take 20 mg by mouth See admin instructions. Take on Monday, Tuesday, Thursday, and Friday. 11/13/18  Yes [name], DO  ACCU-CHEK FASTCLIX LANCETS  MISC Check fsbs TID 08/09/16   [provider]  blood glucose meter kit and supplies KIT Dispense based on patient and insurance preference. Use up to four times daily as directed. Please include lancets, test strips, control solution. 05/16/19   [name], DO  glucose blood (ACCU-CHEK GUIDE) test strip Use to check blood sugar 3 times daily. Dx:E08.00 02/26/18   [name], DO  Insulin Pen Needle (ULTICARE SHORT PEN NEEDLES) 31G X 8 MM MISC USE AS DIRECTED TO INJECT LANTUS AND BYETTA 10/19/18   [name], DO  metoCLOPramide (REGLAN) 10 MG tablet Take 1 tablet 30 minutes before each half the prep. Patient not taking: Reported on 08/13/2019 07/31/19   Levin Erp, PA    Scheduled Meds: . escitalopram  10 mg Oral QHS  . heparin  5,000 Units Subcutaneous Q8H  . magnesium oxide  400 mg Oral BID  . megestrol  40 mg Oral  Daily  . sodium bicarbonate  650 mg Oral TID  . sodium chloride flush  3 mL Intravenous Q12H   Infusions:  PRN Meds:    Allergies as of 08/15/2019 - Review Complete 08/15/2019  Allergen Reaction Noted  . Penicillins Anaphylaxis 02/01/2013  . Atrovent [ipratropium] Palpitations 08/11/2017  . Naproxen Rash 02/01/2013  . Quinine derivatives Rash 04/14/2014  . Sulfa antibiotics Rash 02/01/2013    Family History  Problem Relation Age of Onset  . Hypertension Mother   . COPD Mother   . Alcoholism Father   . Heart attack Father   . Diabetes Brother   . Diabetes Paternal Grandmother   . Heart disease Paternal Grandmother   . Alcoholism Paternal Uncle   . Alcoholism Maternal Uncle   . Heart disease Maternal Grandmother   . Heart disease Maternal Grandfather   . Heart disease Paternal Grandfather     Social History   Socioeconomic History  . Marital status: Married    Spouse name: Paizleigh Wilds  . Number of children: 1  . Years of education: Not on file  . Highest education level: Associate degree: academic program    Occupational History  . Not on file  Tobacco Use  . Smoking status: Never Smoker  . Smokeless tobacco: Never Used  Substance and Sexual Activity  . Alcohol use: No  . Drug use: No  . Sexual activity: Yes    Partners: Male    Birth control/protection: None  Other Topics Concern  . Not on file  Social History Narrative  . Not on file   Social Determinants of Health   Financial Resource Strain:   . Difficulty of Paying Living Expenses:   Food Insecurity:   . Worried About Charity fundraiser in the Last Year:   . Arboriculturist in the Last Year:   Transportation Needs:   . Film/video editor (Medical):   Marland Kitchen Lack of Transportation (Non-Medical):   Physical Activity:   . Days of Exercise per Week:   . Minutes of Exercise per Session:   Stress:   . Feeling of Stress :   Social Connections:   . Frequency of Communication with Friends and Family:   . Frequency of Social Gatherings with Friends and Family:   . Attends Religious Services:   . Active Member of Clubs or Organizations:   . Attends Archivist Meetings:   Marland Kitchen Marital Status:   Intimate Partner Violence:   . Fear of Current or Ex-Partner:   . Emotionally Abused:   Marland Kitchen Physically Abused:   . Sexually Abused:     REVIEW OF SYSTEMS: Constitutional: Feels well.  No fatigue ENT:  No nose bleeds Pulm: No shortness of breath CV:  No palpitations, no LE edema.  GU:  No hematuria, no frequency GI: See HPI.  No dysphagia.  No nausea or vomiting. Heme: No unusual bleeding or bruising. Transfusions: None. Neuro:  No headaches, no peripheral tingling or numbness.  No dizziness, no headaches. Derm:  No itching, no rash or sores.  Endocrine:  No sweats or chills.  No polyuria or dysuria Immunization: Has received her Covid vaccination series. Travel:  None beyond local counties in last few months.    PHYSICAL EXAM: Vital signs in last 24 hours: Vitals:   08/19/19 0506 08/19/19 0906  BP: 94/65 (!) 93/54   Pulse: 74 76  Resp: 18 18  Temp: 98 F (36.7 C) 97.6 F (36.4 C)  SpO2: 98% 100%  Wt Readings from Last 3 Encounters:  08/17/19 65.9 kg  08/13/19 78 kg  07/31/19 78.1 kg    General: Pleasant, does not look unwell.  Alert, comfortable. Head: No facial asymmetry or swelling.  No signs of head trauma. Eyes: No conjunctival pallor.  EOMI.  No scleral icterus. Ears: No hearing deficit. Nose: No congestion, no discharge Mouth: Oral mucosa moist, pink, clear.  Tongue midline. Neck: No JVD, no masses, no thyromegaly. Lungs: Clear bilaterally.  No labored breathing or cough. Heart: RRR.  No MRG.  S1, S2 present Abdomen: Distended/protuberant, nontender.  Moderately tense.  Unable to appreciate hepatosplenomegaly, hernias, bruits.  Bowel sounds active..   Rectal: Deferred Musc/Skeltl: No joint deformities, swelling or redness Extremities: No lower extremity edema. Neurologic: Fully alert and oriented x3.  Good historian.  No tremors or asterixis.  Moves all 4 limbs, strength grossly normal but strength not formally tested. Skin: No rash, no sores, no telangiectasia. Nodes: No cervical adenopathy. Psych: Calm, pleasant, cooperative.  Fluid speech.  Intake/Output from previous day: 04/18 0701 - 04/19 0700 In: 680 [P.O.:680] Out: 0  Intake/Output this shift: Total I/O In: 300 [P.O.:300] Out: -   LAB RESULTS: Recent Labs    08/17/19 0556 08/18/19 0519  WBC 4.7 3.5*  HGB 9.6* 9.1*  HCT 28.5* 27.1*  PLT 109* 87*   BMET Lab Results  Component Value Date   NA 133 (L) 08/18/2019   NA 130 (L) 08/17/2019   NA 128 (L) 08/16/2019   K 4.5 08/18/2019   K 5.3 (H) 08/17/2019   K 6.3 (HH) 08/16/2019   CL 107 08/18/2019   CL 106 08/17/2019   CL 104 08/16/2019   CO2 18 (L) 08/18/2019   CO2 16 (L) 08/17/2019   CO2 17 (L) 08/16/2019   GLUCOSE 76 08/18/2019   GLUCOSE 86 08/17/2019   GLUCOSE 87 08/16/2019   BUN 24 (H) 08/18/2019   BUN 27 (H) 08/17/2019   BUN 24 (H) 08/16/2019    CREATININE 0.93 08/18/2019   CREATININE 1.64 (H) 08/17/2019   CREATININE 1.31 (H) 08/16/2019   CALCIUM 8.3 (L) 08/18/2019   CALCIUM 8.5 (L) 08/17/2019   CALCIUM 8.6 (L) 08/16/2019   LFT Recent Labs    08/16/19 1408 08/17/19 0556 08/18/19 0519  PROT  --   --  5.3*  ALBUMIN 2.4* 2.4* 2.4*  AST  --   --  28  ALT  --   --  15  ALKPHOS  --   --  51  BILITOT  --   --  1.1   PT/INR Lab Results  Component Value Date   INR 1.3 (H) 08/16/2019   INR 1.4 (H) 07/31/2019   Hepatitis Panel No results for input(s): HEPBSAG, HCVAB, HEPAIGM, HEPBIGM in the last 72 hours. C-Diff No components found for: CDIFF Lipase     Component Value Date/Time   LIPASE 49 02/09/2018 1149    Drugs of Abuse  No results found for: LABOPIA, COCAINSCRNUR, LABBENZ, AMPHETMU, THCU, LABBARB   RADIOLOGY STUDIES: No results found.    IMPRESSION:   *   Cirrhosis of liver.  Dx on 07/31/19 ultrasound.   ? Due to fatty liver ( no imaging to document precursor fatty liver however) vs AIH?   *   Ascites.  10 L paracentesis 08/01/2019, no SBP. Lasix, Aldactone initiated and now on hold due to hyperkalemia. Dr. Marylyn Ishihara plans restart Lasix 4/20 if BP allows (has been hypotensive) Received 12.5 g 25% albumin x1 dose 4/17.  *  Hyperkalemia.  Treated w Steward Drone.  MD plans to stop [facility] tomorrow Aldactone on hold and per Dr. Bufford Spikes note last week, should not be restarted.  *   Splenomegaly.    *   IDA.  Improved on bid oral iron from 08/2018 to 07/2019.    *  Esophageal and gastric varices, see above.  Plan was for repeat EGD, esoph variceal banding at [facility] (Dr Loletha Carrow) on 4/20 This is now set at 60 tmrw with Dr Lyndel Safe.      *   NIDDM  *    Anorexia.  Dr. Marylyn Ishihara added Megace.   PLAN:     *   EGD with esoph variceal bancing tmrw AM.    *   Stop the subcutaneous heparin at midnight.  *   Repeat paracentesis. Creatinine is normal so will order up to 6 liters off. Albumin 25% solution, 12.5 gm  dose x 1 after tap.    *    If unable to find an adequate effective, safe diuretic regimen, TIPS might be an option.   Azucena Freed  08/19/2019, 11:29 AM Phone [phone]    Attending physician's note   I have taken an interval history, reviewed the chart and examined the patient. I agree with the Advanced Practitioner's note, impression and recommendations.   NASH decompensated liver cirrhosis with portal hypertension Ascites s/p LVP 08/01/2019. No SBP.  Had hypokalemia on Aldactone, treated with Kayexalate, lokelma. On IV albumin. Eso varices on EGD 08/13/2019. Not a candidate for beta-blockers d/t ascites.  She was scheduled for EGD with [facility] at [place]: -Continue supportive treatment  -EGD with [facility] tomorrow.  I have discussed risks and benefits.  Carmell Austria, MD Velora Heckler Fabienne Bruns [phone].

## 2019-08-19 NOTE — H&P (View-Only) (Signed)
[facility] Consult: 11:29 AM 08/19/2019  LOS: 3 days    Referring Provider: Dr Marylyn Ishihara  Primary Care Physician:  [name], DO Primary Gastroenterologist:  Dr. Havery Moros    Reason for Consultation:  Esophageal varices, cirrhosis, ascites, hyperkalemia.     HPI: Meagan Peck is a 59 y.o. female.  Until recently she had a diagnosis of NIDDM, iron deficiency anemia.  Previous surgeries include cholecystectomy, knee surgeries, carpal tunnel release, C-section.    Abdominal and lower extremity swelling with 33# weight gain starting January 2021.  PMD worked up and ultrasound showed cirrhosis, large volume ascites, splenomegaly, possible portal hypertension.  Echocardiogram w normal LVEF.  Labs for follow-up of iron deficiency showed stable Hb at 10.6 and improved iron parameters. LFTs never elevated.  Had no previous abdominal imaging until 07/2019 ultrasound.    Seen for initial GI visit on 07/31/2019. Hepatitis serologies negative/nonreactive. Negative/unrevealing studies: Ceruloplasmin, smooth muscle antibody, mitochondrial IgG, alpha 1 antitrypsin (elevated), IgG 1618 (RR [id number]) ANA positive, titre 1:80, nuclear speckled patern AFP 2.3  08/01/2019 paracentesis: 10 liters removed.  Received dose Albumin post tap.  Albumin less than 1.  WBCs but no organisms on Gram stain.  No growth on culture.  Cell count/differential negative for SBP.  She lost 28#after the paracentesis 08/13/19 EGD w Multiple columns large mid and lower esophageal varices, erythematous near GE junction.  Atrophic/congested mucosa throughout the stomach suspect due to portal hypertension gastropathy, biopsies obtained.  Suspicion of small, type II gastroesophageal varices in the cardia. Pathology: chronic inactive gastritis, no H.  pylori, dysplasia, malignancy. 08/13/2019 colonoscopy.  A few small, nonbleeding sigmoid, descending, IC valve AVMs, were not treated or ablated.  Small internal hemorrhoids.  She had been set up for hospital-based outpatient banding of esophageal varices tomorrow with Dr. Rachael Darby.  This is now rescheduled for Dr. Lyndel Safe at [facility] tomorrow morning.  Dr. Havery Moros has been managing diuretics of Lasix and Aldactone.  She developed hyperkalemia.  This did not improve despite increasing dose of Lasix to 40 bid, continue Aldactone 50/day.  As of 4/15 he planned to stop Aldactone but sent her to ED for [facility] for potassium 6.2, up from 5.2 two days prior.  Also dx AKI: 27/1.6, improved today.  Hyponatremia 126 >> 133. LFTs normal.      No family history of liver disease, colorectal cancer, inflammatory bowel disease, autoimmune disorders, ulcers, GI bleeding.  The predominant medical issues in close relatives is heart disease and COPD.  Works as a Careers information officer.  Past Medical History:  Diagnosis Date  . Allergy   . Arthritis   . Cataract   . Diabetes mellitus without complication ([place])   . GERD (gastroesophageal reflux disease)   . Hypertension   . Iron deficiency anemia 09/26/2018    Past Surgical History:  Procedure Laterality Date  . CARPAL TUNNEL RELEASE Right   . CESAREAN SECTION    . CHOLECYSTECTOMY    . IR PARACENTESIS  08/01/2019  . KNEE ARTHROSCOPY Right 12/10/2015   Procedure: RIGHT KNEE ARTHROSCOPY WITH PARTIAL MEDIAL  MENISCECTOMY;  Surgeon: Mcarthur Rossetti, MD;  Location: [facility];  Service: Orthopedics;  Laterality: Right;  . KNEE ARTHROSCOPY W/ MENISCAL REPAIR Bilateral   . MENISCUS REPAIR Left     Prior to Admission medications   Medication Sig Start Date End Date Taking? Authorizing Provider  aspirin 81 MG tablet Take 81 mg by mouth daily.   Yes [provider]  cetirizine (ZYRTEC) 10 MG tablet Take 1 tablet (10 mg total) by mouth daily. 01/02/19  Yes  [name], DO  Cholecalciferol (VITAMIN D3) 5000 UNITS TABS Take 5,000 Units by mouth daily.    Yes [provider]  diclofenac sodium (VOLTAREN) 1 % GEL Apply 4 g topically 4 (four) times daily. To affected joint. Patient taking differently: Apply 4 g topically 4 (four) times daily as needed (For knee pain).  10/05/17  Yes Gregor Hams, MD  escitalopram (LEXAPRO) 10 MG tablet TAKE 1 TABLET (10 MG TOTAL) BY MOUTH EVERY EVENING. Patient taking differently: Take 10 mg by mouth at bedtime.  05/31/19  Yes [name], DO  ferrous sulfate 325 (65 FE) MG EC tablet Take 1 tablet (325 mg total) by mouth 2 (two) times daily with a meal. 09/26/18  Yes [name], DO  furosemide (LASIX) 40 MG tablet Take 1 tablet (40 mg total) by mouth daily. 07/31/19  Yes Levin Erp, PA  ibuprofen (ADVIL,MOTRIN) 800 MG tablet Take 1 tablet (800 mg total) by mouth every 8 (eight) hours as needed for moderate pain. 05/15/17  Yes [name], DO  Krill Oil 500 MG CAPS Take 500 mg by mouth daily.   Yes [provider]  lisinopril (ZESTRIL) 40 MG tablet TAKE 1 TABLET (40 MG TOTAL) BY MOUTH DAILY. 05/31/19  Yes [name], DO  magnesium oxide (MAG-OX) 400 MG tablet Take 1 tablet (400 mg total) by mouth 2 (two) times daily. 01/02/19  Yes [name], DO  metFORMIN (GLUCOPHAGE) 1000 MG tablet TAKE 1 TABLET (1,000 MG TOTAL) BY MOUTH 2 (TWO) TIMES DAILY WITH A MEAL. 07/03/19  Yes Alexander, Natalie, DO  OZEMPIC, 0.25 OR 0.5 MG/DOSE, 2 MG/1.5ML SOPN INJECT 0.5 MG INTO THE SKIN ONCE A WEEK. Patient taking differently: Inject 0.5 mg into the skin every Sunday.  08/13/19  Yes [name], DO  rosuvastatin (CRESTOR) 20 MG tablet Take 1 tablet (20 mg total) by mouth 4 (four) times a week. Patient taking differently: Take 20 mg by mouth See admin instructions. Take on Monday, Tuesday, Thursday, and Friday. 11/13/18  Yes [name], DO  ACCU-CHEK FASTCLIX LANCETS  MISC Check fsbs TID 08/09/16   [provider]  blood glucose meter kit and supplies KIT Dispense based on patient and insurance preference. Use up to four times daily as directed. Please include lancets, test strips, control solution. 05/16/19   [name], DO  glucose blood (ACCU-CHEK GUIDE) test strip Use to check blood sugar 3 times daily. Dx:E08.00 02/26/18   [name], DO  Insulin Pen Needle (ULTICARE SHORT PEN NEEDLES) 31G X 8 MM MISC USE AS DIRECTED TO INJECT LANTUS AND BYETTA 10/19/18   [name], DO  metoCLOPramide (REGLAN) 10 MG tablet Take 1 tablet 30 minutes before each half the prep. Patient not taking: Reported on 08/13/2019 07/31/19   Levin Erp, PA    Scheduled Meds: . escitalopram  10 mg Oral QHS  . heparin  5,000 Units Subcutaneous Q8H  . magnesium oxide  400 mg Oral BID  . megestrol  40 mg Oral  Daily  . sodium bicarbonate  650 mg Oral TID  . sodium chloride flush  3 mL Intravenous Q12H   Infusions:  PRN Meds:    Allergies as of 08/15/2019 - Review Complete 08/15/2019  Allergen Reaction Noted  . Penicillins Anaphylaxis 02/01/2013  . Atrovent [ipratropium] Palpitations 08/11/2017  . Naproxen Rash 02/01/2013  . Quinine derivatives Rash 04/14/2014  . Sulfa antibiotics Rash 02/01/2013    Family History  Problem Relation Age of Onset  . Hypertension Mother   . COPD Mother   . Alcoholism Father   . Heart attack Father   . Diabetes Brother   . Diabetes Paternal Grandmother   . Heart disease Paternal Grandmother   . Alcoholism Paternal Uncle   . Alcoholism Maternal Uncle   . Heart disease Maternal Grandmother   . Heart disease Maternal Grandfather   . Heart disease Paternal Grandfather     Social History   Socioeconomic History  . Marital status: Married    Spouse name: Analysa Nutting  . Number of children: 1  . Years of education: Not on file  . Highest education level: Associate degree: academic program    Occupational History  . Not on file  Tobacco Use  . Smoking status: Never Smoker  . Smokeless tobacco: Never Used  Substance and Sexual Activity  . Alcohol use: No  . Drug use: No  . Sexual activity: Yes    Partners: Male    Birth control/protection: None  Other Topics Concern  . Not on file  Social History Narrative  . Not on file   Social Determinants of Health   Financial Resource Strain:   . Difficulty of Paying Living Expenses:   Food Insecurity:   . Worried About Charity fundraiser in the Last Year:   . Arboriculturist in the Last Year:   Transportation Needs:   . Film/video editor (Medical):   Marland Kitchen Lack of Transportation (Non-Medical):   Physical Activity:   . Days of Exercise per Week:   . Minutes of Exercise per Session:   Stress:   . Feeling of Stress :   Social Connections:   . Frequency of Communication with Friends and Family:   . Frequency of Social Gatherings with Friends and Family:   . Attends Religious Services:   . Active Member of Clubs or Organizations:   . Attends Archivist Meetings:   Marland Kitchen Marital Status:   Intimate Partner Violence:   . Fear of Current or Ex-Partner:   . Emotionally Abused:   Marland Kitchen Physically Abused:   . Sexually Abused:     REVIEW OF SYSTEMS: Constitutional: Feels well.  No fatigue ENT:  No nose bleeds Pulm: No shortness of breath CV:  No palpitations, no LE edema.  GU:  No hematuria, no frequency GI: See HPI.  No dysphagia.  No nausea or vomiting. Heme: No unusual bleeding or bruising. Transfusions: None. Neuro:  No headaches, no peripheral tingling or numbness.  No dizziness, no headaches. Derm:  No itching, no rash or sores.  Endocrine:  No sweats or chills.  No polyuria or dysuria Immunization: Has received her Covid vaccination series. Travel:  None beyond local counties in last few months.    PHYSICAL EXAM: Vital signs in last 24 hours: Vitals:   08/19/19 0506 08/19/19 0906  BP: 94/65 (!) 93/54   Pulse: 74 76  Resp: 18 18  Temp: 98 F (36.7 C) 97.6 F (36.4 C)  SpO2: 98% 100%  Wt Readings from Last 3 Encounters:  08/17/19 65.9 kg  08/13/19 78 kg  07/31/19 78.1 kg    General: Pleasant, does not look unwell.  Alert, comfortable. Head: No facial asymmetry or swelling.  No signs of head trauma. Eyes: No conjunctival pallor.  EOMI.  No scleral icterus. Ears: No hearing deficit. Nose: No congestion, no discharge Mouth: Oral mucosa moist, pink, clear.  Tongue midline. Neck: No JVD, no masses, no thyromegaly. Lungs: Clear bilaterally.  No labored breathing or cough. Heart: RRR.  No MRG.  S1, S2 present Abdomen: Distended/protuberant, nontender.  Moderately tense.  Unable to appreciate hepatosplenomegaly, hernias, bruits.  Bowel sounds active..   Rectal: Deferred Musc/Skeltl: No joint deformities, swelling or redness Extremities: No lower extremity edema. Neurologic: Fully alert and oriented x3.  Good historian.  No tremors or asterixis.  Moves all 4 limbs, strength grossly normal but strength not formally tested. Skin: No rash, no sores, no telangiectasia. Nodes: No cervical adenopathy. Psych: Calm, pleasant, cooperative.  Fluid speech.  Intake/Output from previous day: 04/18 0701 - 04/19 0700 In: 680 [P.O.:680] Out: 0  Intake/Output this shift: Total I/O In: 300 [P.O.:300] Out: -   LAB RESULTS: Recent Labs    08/17/19 0556 08/18/19 0519  WBC 4.7 3.5*  HGB 9.6* 9.1*  HCT 28.5* 27.1*  PLT 109* 87*   BMET Lab Results  Component Value Date   NA 133 (L) 08/18/2019   NA 130 (L) 08/17/2019   NA 128 (L) 08/16/2019   K 4.5 08/18/2019   K 5.3 (H) 08/17/2019   K 6.3 (HH) 08/16/2019   CL 107 08/18/2019   CL 106 08/17/2019   CL 104 08/16/2019   CO2 18 (L) 08/18/2019   CO2 16 (L) 08/17/2019   CO2 17 (L) 08/16/2019   GLUCOSE 76 08/18/2019   GLUCOSE 86 08/17/2019   GLUCOSE 87 08/16/2019   BUN 24 (H) 08/18/2019   BUN 27 (H) 08/17/2019   BUN 24 (H) 08/16/2019    CREATININE 0.93 08/18/2019   CREATININE 1.64 (H) 08/17/2019   CREATININE 1.31 (H) 08/16/2019   CALCIUM 8.3 (L) 08/18/2019   CALCIUM 8.5 (L) 08/17/2019   CALCIUM 8.6 (L) 08/16/2019   LFT Recent Labs    08/16/19 1408 08/17/19 0556 08/18/19 0519  PROT  --   --  5.3*  ALBUMIN 2.4* 2.4* 2.4*  AST  --   --  28  ALT  --   --  15  ALKPHOS  --   --  51  BILITOT  --   --  1.1   PT/INR Lab Results  Component Value Date   INR 1.3 (H) 08/16/2019   INR 1.4 (H) 07/31/2019   Hepatitis Panel No results for input(s): HEPBSAG, HCVAB, HEPAIGM, HEPBIGM in the last 72 hours. C-Diff No components found for: CDIFF Lipase     Component Value Date/Time   LIPASE 49 02/09/2018 1149    Drugs of Abuse  No results found for: LABOPIA, COCAINSCRNUR, LABBENZ, AMPHETMU, THCU, LABBARB   RADIOLOGY STUDIES: No results found.    IMPRESSION:   *   Cirrhosis of liver.  Dx on 07/31/19 ultrasound.   ? Due to fatty liver ( no imaging to document precursor fatty liver however) vs AIH?   *   Ascites.  10 L paracentesis 08/01/2019, no SBP. Lasix, Aldactone initiated and now on hold due to hyperkalemia. Dr. Marylyn Ishihara plans restart Lasix 4/20 if BP allows (has been hypotensive) Received 12.5 g 25% albumin x1 dose 4/17.  *  Hyperkalemia.  Treated w Steward Drone.  MD plans to stop [facility] tomorrow Aldactone on hold and per Dr. Bufford Spikes note last week, should not be restarted.  *   Splenomegaly.    *   IDA.  Improved on bid oral iron from 08/2018 to 07/2019.    *  Esophageal and gastric varices, see above.  Plan was for repeat EGD, esoph variceal banding at [facility] (Dr Loletha Carrow) on 4/20 This is now set at 10 tmrw with Dr Lyndel Safe.      *   NIDDM  *    Anorexia.  Dr. Marylyn Ishihara added Megace.   PLAN:     *   EGD with esoph variceal bancing tmrw AM.    *   Stop the subcutaneous heparin at midnight.  *   Repeat paracentesis. Creatinine is normal so will order up to 6 liters off. Albumin 25% solution, 12.5 gm  dose x 1 after tap.    *    If unable to find an adequate effective, safe diuretic regimen, TIPS might be an option.   Azucena Freed  08/19/2019, 11:29 AM Phone [phone]    Attending physician's note   I have taken an interval history, reviewed the chart and examined the patient. I agree with the Advanced Practitioner's note, impression and recommendations.   NASH decompensated liver cirrhosis with portal hypertension Ascites s/p LVP 08/01/2019. No SBP.  Had hypokalemia on Aldactone, treated with Kayexalate, lokelma. On IV albumin. Eso varices on EGD 08/13/2019. Not a candidate for beta-blockers d/t ascites.  She was scheduled for EGD with [facility] at [place]: -Continue supportive treatment  -EGD with [facility] tomorrow.  I have discussed risks and benefits.  Carmell Austria, MD Velora Heckler Fabienne Bruns [phone].

## 2019-08-19 NOTE — Procedures (Signed)
PROCEDURE SUMMARY:  Successful image-guided paracentesis from the right lower abdomen.  Yielded 6.0 liters of clear yellow fluid which was the requested maximum.  No immediate complications.  EBL: zero Patient tolerated well.   Specimen was not sent for labs.  Please see imaging section of Epic for full dictation.  Joaquim Nam PA-C 08/19/2019 2:39 PM

## 2019-08-19 NOTE — Progress Notes (Signed)
[facility]  PROGRESS NOTE    Meagan Peck  PPJ:[id number] DOB: [birth year] DOA: 08/15/2019 PCP: [name], DO   Brief Narrative:   Meagan Peck a 59 y.o.femalewith medical history significant forhepatic cirrhosis with ascites, esophageal varices, and portal hypertensive gastropathy, type 2 diabetes, hypertension, hyperlipidemia, iron deficiency anemia who presents to the ED for evaluation of abnormal labs.  Patient is being evaluated for recently found hepatic cirrhosis by her gastroenterology team.  On 07/31/2018 when she was started on diuretic therapy with spironolactone 50 mg daily and Lasix 40 mg daily for management of her ascites.  She underwent paracentesis on 08/01/2019 with 10 L of peritoneal fluid removed. Cytology was negative for malignant cells and culture no growth.  On 08/13/2019 she underwent EGD which showed large esophageal varices and small gastroesophageal varices. Colonoscopy showed few small benign-appearing nonbleeding colonic angiodysplastic lesions and internal hemorrhoids. She was plan to undergo gastric varices banding on 08/19/2019.  She had labs on 08/13/2019 which showed mildly increased potassium of 5.2. She was advised to increase her Lasix to 40 mg twice a day and continue spironolactone 50 mg daily. She had repeat lab work performed on 08/15/2019 which showed an increase potassium to 6.2. She was advised to discontinue her spironolactone and come to the ED for further evaluation.  She currently reports beginning to have recurrent swelling of her abdomen but denies any abdominal pain. She denies any shortness of breath, cough, chest pain, palpitations, nausea, vomiting, dysuria, diarrhea, or constipation.  4/19: Labs pending. GI onboard. Have ordered paracentesis. Increase megace. She states that she feels fine today. Pressures still soft. Don't know that she will tolerate lasix today. Let's see if we can get her OOB today and more mobile.     Assessment & Plan:   Principal Problem:   Hyperkalemia Active Problems:   Hyperlipidemia associated with type 2 diabetes mellitus (HCC)   Type 2 diabetes mellitus without complication, with long-term current use of insulin (HCC)   Hypertension associated with diabetes ([place])   Cirrhosis (HCC)   Hyperkalemia: - K+ 6.4 on admission in the setting of spironolactone, lisinopril, and AKI. - was given 62m lasix PO x 1 and started on 496mqday after - was given lokelma 1073m 1 - 4/17: gave kayexelate 44m75msterday and her K+ has improved to 5.3 this AM, will now schedule lokelma for 48 hrs     - 4/18: K+ is resolved. Can resume daily lasix tomorrow if BP improved     - 4/19: Labs pending. Lokelma has completed. BP still a little soft for lasix.   Hyponatremia: Hypomagnesemia - Mild in the setting of cirrhosis with ascites.  - She was given 500 cc normal saline in the ED. - 4/17: She is up to 130 today; getting a small bolus today d/t hypotension, watch Na+; add Mg2+, monitor     - 4/18: Mg2+ is 1.6. Give MagOx     - 4/19: labs pending, follow  Hepatic cirrhosis with ascites, esophageal varices, and portal hypertensive gastropathy: - S/p paracentesis 08/01/2019 with 10 L removed.  - Abdomen distended again with ascites on examination. Otherwise no evidence of hepatic encephalopathy or decompensation. - Lasix as above, holding spironolactone due to hyperkalemia - Follow-up with GI, plan for gastric varices banding on 08/19/2019     - 4/18: GI will eval her in the AM     - 4/19: GI will send for paracentesis today and banding tomorrow  Type 2 diabetes: - On Metformin  and Ozempic as an outpatient.  - Serum glucose 70 on admission. - 4/18: Glucoses are still border line. Will give megace as appetite stimulant.       - 4/19: increase megace  Hypertension: - Hypotensive on arrival.  - Holding spironolactone  and lisinopril as above. - 4/17: still hypotensive today; give small bolus, will hold lasix     - 4/18: BP is stable. Got fluids today. She seems to be on a chronically low side, she denies complaints today.       - 4/19: She actually had normotensive levels with her outpt visits as recently as March; pressure is still soft, but will hold fluids as she continues to third-space  Hyperlipidemia: - On rosuvastatin as an outpatient, currently on hold.  DVT prophylaxis: heparin Code Status: FULL    Status is: Inpatient  Remains inpatient appropriate because:Inpatient level of care appropriate due to severity of illness   Dispo: The patient is from: Home              Anticipated d/c is to: Home              Anticipated d/c date is: 2 days              Patient currently is not medically stable to d/c.   Consultants:   GI  Procedures:   Paracentesis   ROS:  Denies N, V, CP, ab pain. Reports poor appetite . Remainder 10-pt ROS is negative for all not previously mentioned.  Subjective: "I guess it's alright. I'm here."  Objective: Vitals:   08/18/19 1749 08/18/19 2106 08/19/19 0506 08/19/19 0906  BP: 106/65 97/61 94/65  (!) 93/54  Pulse: 71 68 74 76  Resp: 18 16 18 18   Temp: 98.2 F (36.8 C) 97.7 F (36.5 C) 98 F (36.7 C) 97.6 F (36.4 C)  TempSrc: Oral Oral Oral Oral  SpO2: 100% 97% 98% 100%  Weight:      Height:        Intake/Output Summary (Last 24 hours) at 08/19/2019 1304 Last data filed at 08/19/2019 0900 Gross per 24 hour  Intake 620 ml  Output 0 ml  Net 620 ml   Filed Weights   08/16/19 0354 08/16/19 0407 08/17/19 2127  Weight: 66.1 kg 65.9 kg 65.9 kg    Examination:  General: 59 y.o. female resting in bed in NAD Cardiovascular: RRR, +S1, S2, no m/g/r, equal pulses throughout Respiratory: CTABL, no w/r/r, normal WOB GI: BS+, distended and a little tighter this AM, NT MSK: No e/c/c Neuro: A&O x 3, no focal deficits Psyc: Appropriate  interaction and affect, calm/cooperative   Data Reviewed: I have personally reviewed following labs and imaging studies.  CBC: Recent Labs  Lab 08/15/19 1915 08/16/19 0342 08/17/19 0556 08/18/19 0519  WBC 6.3 4.9 4.7 3.5*  NEUTROABS  --   --  2.6 1.8  HGB 10.7* 9.9* 9.6* 9.1*  HCT 33.5* 31.1* 28.5* 27.1*  MCV 83.8 84.7 82.6 81.4  PLT 144* PLATELET CLUMPS NOTED ON SMEAR, UNABLE TO ESTIMATE 109* 87*   Basic Metabolic Panel: Recent Labs  Lab 08/15/19 1915 08/16/19 0342 08/16/19 1408 08/17/19 0556 08/18/19 0519  NA 128* 128* 128* 130* 133*  K 6.4* 5.5* 6.3* 5.3* 4.5  CL 104 104 104 106 107  CO2 15* 15* 17* 16* 18*  GLUCOSE 70 73 87 86 76  BUN 23* 23* 24* 27* 24*  CREATININE 1.38* 1.19* 1.31* 1.64* 0.93  CALCIUM 9.3 8.7* 8.6* 8.5* 8.3*  MG  --   --   --  1.2* 1.6*  PHOS  --   --  5.1* 5.0*  --    GFR: Estimated Creatinine Clearance: 55 mL/min (by C-G formula based on SCr of 0.93 mg/dL). Liver Function Tests: Recent Labs  Lab 08/16/19 0014 08/16/19 1408 08/17/19 0556 08/18/19 0519  AST 38  --   --  28  ALT 22  --   --  15  ALKPHOS 63  --   --  51  BILITOT 1.7*  --   --  1.1  PROT 6.8  --   --  5.3*  ALBUMIN 3.0* 2.4* 2.4* 2.4*   No results for input(s): LIPASE, AMYLASE in the last 168 hours. No results for input(s): AMMONIA in the last 168 hours. Coagulation Profile: Recent Labs  Lab 08/16/19 0014  INR 1.3*   Cardiac Enzymes: No results for input(s): CKTOTAL, CKMB, CKMBINDEX, TROPONINI in the last 168 hours. BNP (last 3 results) No results for input(s): PROBNP in the last 8760 hours. HbA1C: No results for input(s): HGBA1C in the last 72 hours. CBG: Recent Labs  Lab 08/16/19 0647 08/16/19 1119 08/16/19 2117 08/17/19 0644 08/18/19 0706  GLUCAP 99 84 99 87 71   Lipid Profile: No results for input(s): CHOL, HDL, LDLCALC, TRIG, CHOLHDL, LDLDIRECT in the last 72 hours. Thyroid Function Tests: No results for input(s): TSH, T4TOTAL, FREET4, T3FREE,  THYROIDAB in the last 72 hours. Anemia Panel: No results for input(s): VITAMINB12, FOLATE, FERRITIN, TIBC, IRON, RETICCTPCT in the last 72 hours. Sepsis Labs: No results for input(s): PROCALCITON, LATICACIDVEN in the last 168 hours.  Recent Results (from the past 240 hour(s))  SARS CORONAVIRUS 2 (TAT 6-24 HRS) Nasopharyngeal Nasopharyngeal Swab     Status: None   Collection Time: 08/15/19 11:21 AM   Specimen: Nasopharyngeal Swab  Result Value Ref Range Status   SARS Coronavirus 2 NEGATIVE NEGATIVE Final    Comment: (NOTE) SARS-CoV-2 target nucleic acids are NOT DETECTED. The SARS-CoV-2 RNA is generally detectable in upper and lower respiratory specimens during the acute phase of infection. Negative results do not preclude SARS-CoV-2 infection, do not rule out co-infections with other pathogens, and should not be used as the sole basis for treatment or other patient management decisions. Negative results must be combined with clinical observations, patient history, and epidemiological information. The expected result is Negative. Fact Sheet for Patients: SugarRoll.be Fact Sheet for Healthcare Providers: https://www.woods-mathews.com/ This test is not yet approved or cleared by the [place] FDA and  has been authorized for detection and/or diagnosis of SARS-CoV-2 by FDA under an Emergency Use Authorization (EUA). This EUA will remain  in effect (meaning this test can be used) for the duration of the COVID-19 declaration under Section 56 4(b)(1) of the Act, 21 U.S.C. section 360bbb-3(b)(1), unless the authorization is terminated or revoked sooner. Performed at [facility], [place] [address] [place], [place] [address]       Radiology Studies: No results found.   Scheduled Meds: . escitalopram  10 mg Oral QHS  . heparin  5,000 Units Subcutaneous Q8H  . magnesium oxide  400 mg Oral BID  . megestrol  400 mg Oral Daily  .  sodium bicarbonate  650 mg Oral TID  . sodium chloride flush  3 mL Intravenous Q12H   Continuous Infusions: . albumin human       LOS: 3 days    Time spent: 25 minutes spent in the coordination of care today.  Jonnie Finner, DO [facility]  If 7PM-7AM, please contact night-coverage [email or web address] 08/19/2019, 1:04 PM

## 2019-08-20 ENCOUNTER — Inpatient Hospital Stay (HOSPITAL_COMMUNITY): Payer: No Typology Code available for payment source | Admitting: Certified Registered"

## 2019-08-20 ENCOUNTER — Encounter (HOSPITAL_COMMUNITY)
Admission: EM | Disposition: A | Discharge status: Home / Self Care | Payer: Self-pay | Source: Home / Self Care | Attending: Internal Medicine

## 2019-08-20 DIAGNOSIS — K3189 Other diseases of stomach and duodenum: Secondary | ICD-10-CM

## 2019-08-20 DIAGNOSIS — I8510 Secondary esophageal varices without bleeding: Secondary | ICD-10-CM

## 2019-08-20 DIAGNOSIS — K766 Portal hypertension: Secondary | ICD-10-CM

## 2019-08-20 DIAGNOSIS — R630 Anorexia: Secondary | ICD-10-CM

## 2019-08-20 HISTORY — PX: GASTRIC VARICES BANDING: SHX5519

## 2019-08-20 HISTORY — PX: ESOPHAGOGASTRODUODENOSCOPY (EGD) WITH PROPOFOL: SHX5813

## 2019-08-20 HISTORY — PX: BIOPSY: SHX5522

## 2019-08-20 LAB — COMPREHENSIVE METABOLIC PANEL
ALT: 22 U/L (ref 0–44)
AST: 38 U/L (ref 15–41)
Albumin: 2.3 g/dL — ABNORMAL LOW (ref 3.5–5.0)
Alkaline Phosphatase: 60 U/L (ref 38–126)
Anion gap: 7 (ref 5–15)
BUN: 21 mg/dL — ABNORMAL HIGH (ref 6–20)
CO2: 22 mmol/L (ref 22–32)
Calcium: 8.2 mg/dL — ABNORMAL LOW (ref 8.9–10.3)
Chloride: 104 mmol/L (ref 98–111)
Creatinine, Ser: 0.94 mg/dL (ref 0.44–1.00)
GFR calc Af Amer: >60 mL/min (ref >60)
GFR calc non Af Amer: >60 mL/min (ref >60)
Glucose, Bld: 102 mg/dL — ABNORMAL HIGH (ref 70–99)
Potassium: 4.9 mmol/L (ref 3.5–5.1)
Sodium: 133 mmol/L — ABNORMAL LOW (ref 135–145)
Total Bilirubin: 0.8 mg/dL (ref 0.3–1.2)
Total Protein: 5.1 g/dL — ABNORMAL LOW (ref 6.5–8.1)

## 2019-08-20 LAB — CBC WITH DIFFERENTIAL/PLATELET
Abs Immature Granulocytes: 0.01 10*3/uL (ref 0.00–0.07)
Basophils Absolute: 0 10*3/uL (ref 0.0–0.1)
Basophils Relative: 1 %
Eosinophils Absolute: 0.1 10*3/uL (ref 0.0–0.5)
Eosinophils Relative: 3 %
HCT: 27.2 % — ABNORMAL LOW (ref 36.0–46.0)
Hemoglobin: 9.1 g/dL — ABNORMAL LOW (ref 12.0–15.0)
Immature Granulocytes: 0 %
Lymphocytes Relative: 33 %
Lymphs Abs: 1.1 10*3/uL (ref 0.7–4.0)
MCH: 27.7 pg (ref 26.0–34.0)
MCHC: 33.5 g/dL (ref 30.0–36.0)
MCV: 82.9 fL (ref 80.0–100.0)
Monocytes Absolute: 0.4 10*3/uL (ref 0.1–1.0)
Monocytes Relative: 12 %
Neutro Abs: 1.6 10*3/uL — ABNORMAL LOW (ref 1.7–7.7)
Neutrophils Relative %: 51 %
Platelets: 83 10*3/uL — ABNORMAL LOW (ref 150–400)
RBC: 3.28 MIL/uL — ABNORMAL LOW (ref 3.87–5.11)
RDW: 16.4 % — ABNORMAL HIGH (ref 11.5–15.5)
WBC: 3.2 10*3/uL — ABNORMAL LOW (ref 4.0–10.5)
nRBC: 0 % (ref 0.0–0.2)

## 2019-08-20 LAB — PHOSPHORUS: Phosphorus: 2.4 mg/dL — ABNORMAL LOW (ref 2.5–4.6)

## 2019-08-20 LAB — MAGNESIUM: Magnesium: 1.4 mg/dL — ABNORMAL LOW (ref 1.7–2.4)

## 2019-08-20 SURGERY — BAND LIGATION, GASTRIC VARICES
Anesthesia: Monitor Anesthesia Care

## 2019-08-20 MED ORDER — PHENYLEPHRINE 40 MCG/ML (10ML) SYRINGE FOR IV PUSH (FOR BLOOD PRESSURE SUPPORT)
PREFILLED_SYRINGE | INTRAVENOUS | Status: DC | PRN
  Administered 2019-08-20: 80 ug via INTRAVENOUS
  Administered 2019-08-20 (×2): 120 ug via INTRAVENOUS

## 2019-08-20 MED ORDER — PROPOFOL 10 MG/ML IV BOLUS
INTRAVENOUS | Status: DC | PRN
  Administered 2019-08-20: 30 mg via INTRAVENOUS

## 2019-08-20 MED ORDER — SODIUM CHLORIDE 0.9 % IV SOLN: INTRAVENOUS | Status: DC

## 2019-08-20 MED ORDER — ASPIRIN 81 MG PO TABS: 81.0000 mg | ORAL_TABLET | Freq: Every day | ORAL | Status: DC

## 2019-08-20 MED ORDER — EPHEDRINE SULFATE-NACL 50-0.9 MG/10ML-% IV SOSY
PREFILLED_SYRINGE | INTRAVENOUS | Status: DC | PRN
  Administered 2019-08-20: 10 mg via INTRAVENOUS

## 2019-08-20 MED ORDER — LACTATED RINGERS IV SOLN
INTRAVENOUS | Status: AC | PRN
  Administered 2019-08-20: 1000 mL via INTRAVENOUS

## 2019-08-20 MED ORDER — PROPOFOL 500 MG/50ML IV EMUL
INTRAVENOUS | Status: DC | PRN
  Administered 2019-08-20: 150 ug/kg/min via INTRAVENOUS

## 2019-08-20 MED ORDER — MAGNESIUM SULFATE 2 GM/50ML IV SOLN
2.0000 g | Freq: Once | INTRAVENOUS | Status: AC
  Administered 2019-08-20: 2 g via INTRAVENOUS
  Filled 2019-08-20: qty 50

## 2019-08-20 MED ORDER — MEGESTROL ACETATE 625 MG/5ML PO SUSP: 625.0000 mg | Freq: Every day | ORAL | 0 refills | Status: DC

## 2019-08-20 MED ORDER — LIDOCAINE 2% (20 MG/ML) 5 ML SYRINGE
INTRAMUSCULAR | Status: DC | PRN
  Administered 2019-08-20: 40 mg via INTRAVENOUS

## 2019-08-20 MED ORDER — ONDANSETRON HCL 4 MG/2ML IJ SOLN
INTRAMUSCULAR | Status: DC | PRN
  Administered 2019-08-20: 4 mg via INTRAVENOUS

## 2019-08-20 SURGICAL SUPPLY — 15 items

## 2019-08-20 NOTE — Discharge Summary (Signed)
. Physician Discharge Summary  [name] DDU:[id number] DOB: [birth year] DOA: 08/15/2019  PCP: [name], DO  Admit date: 08/15/2019 Discharge date: 08/20/2019  Admitted From: Home Disposition:  Discharged to home  Recommendations for Outpatient Follow-up:  1. Follow up with PCP in 5 - 7 days 2. Please obtain BMP/CBC in one week 3. Follow up with GI as scheduled (2 weeks).  Discharge Condition: Stable  CODE STATUS: FULL   Brief/Interim Summary: Meagan Peck a 59 y.o.femalewith medical history significant forhepatic cirrhosis with ascites, esophageal varices, and portal hypertensive gastropathy, type 2 diabetes, hypertension, hyperlipidemia, iron deficiency anemia who presents to the ED for evaluation of abnormal labs.  Patient is being evaluated for recently found hepatic cirrhosis by her gastroenterology team.  On 07/31/2018 when she was started on diuretic therapy with spironolactone 50 mg daily and Lasix 40 mg daily for management of her ascites.  She underwent paracentesis on 08/01/2019 with 10 L of peritoneal fluid removed. Cytology was negative for malignant cells and culture no growth.  On 08/13/2019 she underwent EGD which showed large esophageal varices and small gastroesophageal varices. Colonoscopy showed few small benign-appearing nonbleeding colonic angiodysplastic lesions and internal hemorrhoids. She was plan to undergo gastric varices banding on 08/19/2019.  She had labs on 08/13/2019 which showed mildly increased potassium of 5.2. She was advised to increase her Lasix to 40 mg twice a day and continue spironolactone 50 mg daily. She had repeat lab work performed on 08/15/2019 which showed an increase potassium to 6.2. She was advised to discontinue her spironolactone and come to the ED for further evaluation.  She currently reports beginning to have recurrent swelling of her abdomen but denies any abdominal pain. She denies any shortness of  breath, cough, chest pain, palpitations, nausea, vomiting, dysuria, diarrhea, or constipation.  4/20: S/p paracentesis yesterday. 6L pulled. S/p EGD w/ EVL. GI ok'd to d/c to home. She feels good and is ready to go. Will discharge. Needs to follow up with PCP and GI.   Discharge Diagnoses:  Principal Problem:   Hyperkalemia Active Problems:   Hyperlipidemia associated with type 2 diabetes mellitus (HCC)   Type 2 diabetes mellitus without complication, with long-term current use of insulin (HCC)   Hypertension associated with diabetes ([place])   Cirrhosis (HCC)  Hyperkalemia: - K+ 6.4 on admission in the setting of spironolactone, lisinopril, and AKI. - was given 20m lasix PO x 1 and started on 412mqday after - was given lokelma 103m 1 - 4/17: gave kayexelate 16m44msterday and her K+ has improved to 5.3 this AM, will now schedule lokelma for 48 hrs - 4/18: K+ is resolved. Can resume daily lasix tomorrow if BP improved - 4/19: Labs pending. Lokelma has completed. BP still a little soft for lasix.     - 4/20: BP better, can resume lasix 40mg11mqday.  Hyponatremia: Hypomagnesemia - Mild in the setting of cirrhosis with ascites.  - She was given 500 cc normal saline in the ED. - 4/17: She is up to 130 today; getting a small bolus today d/t hypotension, watch Na+; add Mg2+, monitor - 4/18: Mg2+ is 1.6. Give MagOx - 4/19: labs pending, follow     - 4/20: Mg2+ down to 1.4, give IV Mg2+; follow up outpt labs  Hepatic cirrhosis with ascites, esophageal varices, and portal hypertensive gastropathy: - S/p paracentesis 08/01/2019 with 10 L removed.  - Abdomen distended again with ascites on examination. Otherwise no evidence of hepatic encephalopathy or  decompensation. - Lasix as above, holding spironolactone due to hyperkalemia - Follow-up with GI, plan for gastric varices banding on 08/19/2019 - 4/18: GI will eval her in  the AM - 4/19: GI will send for paracentesis today and banding tomorrow     - 4/20: s/p paracentesis w/ 6L pulled. s/p EGD w/ EVL today. GI ok'd to d/c. They recommend avoiding NSAIDs for right now. Spoke with GI PA, rec'd 10 days off ASA. Follow up with them in 2 weeks.   Type 2 diabetes: - On Metformin and Ozempic as an outpatient.  - Serum glucose 70 on admission. - 4/18: Glucoses are still border line. Will give megace as appetite stimulant. - 4/19: increase megace     - 4/20: glucose is a little better today, continue megace at discharge  Hypertension: - Hypotensive on arrival.  - Holding spironolactone and lisinopril as above. - 4/17: still hypotensive today; give small bolus, will hold lasix - 4/18: BP is stable. Got fluids today. She seems to be on a chronically low side, she denies complaints today. - 4/19: She actually had normotensive levels with her outpt visits as recently as March; pressure is still soft, but will hold fluids as she continues to third-space     - 4/20: BP is better today. Hold home lisinopril at discharge. Can continue lasix 15m PO at discharge. Follow up with PCP at discharge.   Hyperlipidemia: - On rosuvastatin as an outpatient, currently on hold.     - resume at discharge  Discharge Instructions   Allergies as of 08/20/2019      Reactions   Penicillins Anaphylaxis   Happened as a young child   Atrovent [ipratropium] Palpitations   As per patient   Naproxen Rash   Quinine Derivatives Rash   Sulfa Antibiotics Rash      Medication List    STOP taking these medications   ibuprofen 800 MG tablet Commonly known as: ADVIL   lisinopril 40 MG tablet Commonly known as: ZESTRIL   metoCLOPramide 10 MG tablet Commonly known as: Reglan     TAKE these medications   Accu-Chek FastClix Lancets Misc Check fsbs TID   aspirin 81 MG tablet Take 1 tablet (81 mg total) by mouth daily. Start taking  on: August 30, 2019 What changed: These instructions start on August 30, 2019. If you are unsure what to do until then, ask your doctor or other care provider.   blood glucose meter kit and supplies Kit Dispense based on patient and insurance preference. Use up to four times daily as directed. Please include lancets, test strips, control solution.   cetirizine 10 MG tablet Commonly known as: ZYRTEC Take 1 tablet (10 mg total) by mouth daily.   diclofenac sodium 1 % Gel Commonly known as: VOLTAREN Apply 4 g topically 4 (four) times daily. To affected joint. What changed:   when to take this  reasons to take this  additional instructions   escitalopram 10 MG tablet Commonly known as: LEXAPRO TAKE 1 TABLET (10 MG TOTAL) BY MOUTH EVERY EVENING. What changed: when to take this   ferrous sulfate 325 (65 FE) MG EC tablet Take 1 tablet (325 mg total) by mouth 2 (two) times daily with a meal.   furosemide 40 MG tablet Commonly known as: Lasix Take 1 tablet (40 mg total) by mouth daily.   glucose blood test strip Commonly known as: Accu-Chek Guide Use to check blood sugar 3 times daily. Dx:E08.00   Krill Oil 500  MG Caps Take 500 mg by mouth daily.   magnesium oxide 400 MG tablet Commonly known as: MAG-OX Take 1 tablet (400 mg total) by mouth 2 (two) times daily.   megestrol 625 MG/5ML suspension Commonly known as: MEGACE ES Take 5 mLs (625 mg total) by mouth daily.   metFORMIN 1000 MG tablet Commonly known as: GLUCOPHAGE TAKE 1 TABLET (1,000 MG TOTAL) BY MOUTH 2 (TWO) TIMES DAILY WITH A MEAL.   Ozempic (0.25 or 0.5 MG/DOSE) 2 MG/1.5ML Sopn Generic drug: Semaglutide(0.25 or 0.5MG/DOS) INJECT 0.5 MG INTO THE SKIN ONCE A WEEK. What changed: See the new instructions.   rosuvastatin 20 MG tablet Commonly known as: CRESTOR Take 1 tablet (20 mg total) by mouth 4 (four) times a week. What changed:   when to take this  additional instructions   UltiCare Short Pen Needles  31G X 8 MM Misc Generic drug: Insulin Pen Needle USE AS DIRECTED TO INJECT LANTUS AND BYETTA   Vitamin D3 125 MCG (5000 UT) Tabs Take 5,000 Units by mouth daily.       Allergies  Allergen Reactions  . Penicillins Anaphylaxis    Happened as a young child  . Atrovent [Ipratropium] Palpitations    As per patient  . Naproxen Rash  . Quinine Derivatives Rash  . Sulfa Antibiotics Rash    Consultations:  GI   Procedures/Studies: US Abdomen Complete  Result Date: 07/26/2019 CLINICAL DATA:  Ascites.  Weight gain.  History of cholecystectomy. EXAM: ABDOMEN ULTRASOUND COMPLETE COMPARISON:  None. FINDINGS: Gallbladder: Surgically removed. Common bile duct: Diameter: 0.5 cm Liver: Nodular contour of the liver is suggestive for cirrhosis. Large amount of perihepatic ascites. Liver parenchyma is mildly heterogeneous without a discrete lesion. Portal vein is patent on color Doppler imaging with normal direction of blood flow towards the liver. IVC: No abnormality visualized. Pancreas: Visualized portion unremarkable.  Limited evaluation. Spleen: Spleen measures 13.6 x 14.1 x 5.9 cm. Calculated splenic volume is 592 mL. Normal echogenicity without a focal splenic lesion. Right Kidney: Length: 11.3 cm. Echogenicity within normal limits. No mass or hydronephrosis visualized. Left Kidney: Length: 10.9 cm. Echogenicity within normal limits. No mass or hydronephrosis visualized. Abdominal aorta: Limited evaluation of the abdominal aorta. Other findings: Large volume of ascites throughout the abdomen. IMPRESSION: 1. Liver is nodular and compatible with cirrhosis. Splenomegaly with a large volume of ascites. Findings are suggestive for portal hypertension. 2. No discrete liver lesion. 3. Cholecystectomy.  No biliary dilatation. Electronically Signed   By: Markus Daft M.D.   On: 07/26/2019 09:50   ECHOCARDIOGRAM COMPLETE  Result Date: 07/25/2019    ECHOCARDIOGRAM REPORT   Patient Name:   Meagan Peck Date  of Exam: 07/25/2019 Medical Rec #:  [id number]       Height:       58.0 in Accession #:    [id number]      Weight:       172.0 lb Date of Birth:  1960-11-14       BSA:          1.708 m Patient Age:    59 years        BP:           133/74 mmHg Patient Gender: F               HR:           83 bpm. Exam Location:  [facility] Procedure: 2D Echo, Cardiac Doppler and Color Doppler Indications:  Murmur,Dyspnea  History:        Patient has prior history of Echocardiogram examinations, most                 recent 11/24/2017. Signs/Symptoms:Syncope; Risk                 Factors:Hypertension and Diabetes.  Sonographer:    Cardell Peach RDCS (AE) Referring Phys: [id number] [name]  Sonographer Comments: No subcostal window. IMPRESSIONS  1. Left ventricular ejection fraction, by estimation, is 60 to 65%. The left ventricle has normal function. The left ventricle has no regional wall motion abnormalities. Left ventricular diastolic parameters were normal.  2. Right ventricular systolic function is normal. The right ventricular size is normal. There is normal pulmonary artery systolic pressure.  3. There is mild to moderate posterior annular calcification. Mild mitral valve regurgitation. No evidence of mitral stenosis.  4. The aortic valve is normal in structure. Aortic valve regurgitation is not visualized. No aortic stenosis is present.  5. The inferior vena cava is normal in size with greater than 50% respiratory variability, suggesting right atrial pressure of 3 mmHg. FINDINGS  Left Ventricle: Left ventricular ejection fraction, by estimation, is 60 to 65%. The left ventricle has normal function. The left ventricle has no regional wall motion abnormalities. The left ventricular internal cavity size was normal in size. There is  no left ventricular hypertrophy. Left ventricular diastolic parameters were normal. Right Ventricle: The right ventricular size is normal. No increase in right ventricular wall thickness. Right  ventricular systolic function is normal. There is normal pulmonary artery systolic pressure. The tricuspid regurgitant velocity is 1.82 m/s, and  with an assumed right atrial pressure of 10 mmHg, the estimated right ventricular systolic pressure is 67.3 mmHg. Left Atrium: Left atrial size was normal in size. Right Atrium: Right atrial size was normal in size. Pericardium: There is no evidence of pericardial effusion. Mitral Valve: The mitral valve is normal in structure. Normal mobility of the mitral valve leaflets. Mild to moderate mitral annular calcification. Mild mitral valve regurgitation. No evidence of mitral valve stenosis. Tricuspid Valve: The tricuspid valve is normal in structure. Tricuspid valve regurgitation is trivial. No evidence of tricuspid stenosis. Aortic Valve: The aortic valve is normal in structure. Aortic valve regurgitation is not visualized. No aortic stenosis is present. Aortic valve peak gradient measures 11.9 mmHg. Pulmonic Valve: The pulmonic valve was normal in structure. Pulmonic valve regurgitation is trivial. No evidence of pulmonic stenosis. Aorta: The aortic root is normal in size and structure. Venous: The inferior vena cava is normal in size with greater than 50% respiratory variability, suggesting right atrial pressure of 3 mmHg. IAS/Shunts: No atrial level shunt detected by color flow Doppler.  LEFT VENTRICLE PLAX 2D LVIDd:         4.53 cm  Diastology LVIDs:         2.83 cm  LV e' lateral:   9.90 cm/s LV PW:         1.04 cm  LV E/e' lateral: 9.2 LV IVS:        0.81 cm  LV e' medial:    6.42 cm/s LVOT diam:     1.40 cm  LV E/e' medial:  14.1 LV SV:         55 LV SV Index:   32 LVOT Area:     1.54 cm  RIGHT VENTRICLE RV Basal diam:  3.23 cm RV S prime:     17.70 cm/s TAPSE (M-mode):  3.2 cm LEFT ATRIUM             Index       RIGHT ATRIUM          Index LA diam:        3.60 cm 2.11 cm/m  RA Area:     9.89 cm LA Vol (A2C):   37.5 ml 21.95 ml/m RA Volume:   18.30 ml 10.71 ml/m  LA Vol (A4C):   41.3 ml 24.18 ml/m LA Biplane Vol: 41.5 ml 24.30 ml/m  AORTIC VALVE AV Area (Vmax): 1.37 cm AV Vmax:        172.50 cm/s AV Peak Grad:   11.9 mmHg LVOT Vmax:      153.00 cm/s LVOT Vmean:     121.000 cm/s LVOT VTI:       0.355 m  AORTA Ao Root diam: 2.50 cm Ao Asc diam:  3.30 cm MITRAL VALVE                TRICUSPID VALVE MV Area (PHT): 3.50 cm     TR Peak grad:   13.2 mmHg MV Decel Time: 217 msec     TR Vmax:        182.00 cm/s MV E velocity: 90.80 cm/s MV A velocity: 106.00 cm/s  SHUNTS MV E/A ratio:  0.86         Systemic VTI:  0.36 m                             Systemic Diam: 1.40 cm Kardie Tobb DO Electronically signed by Berniece Salines DO Signature Date/Time: 07/25/2019/1:17:15 PM    Final    IR Paracentesis  Result Date: 08/19/2019 INDICATION: Patient with history of cirrhosis, recurrent ascites. Request to IR for therapeutic paracentesis with 6 L maximum. EXAM: ULTRASOUND GUIDED THERAPEUTIC PARACENTESIS MEDICATIONS: 12 mL 1% lidocaine COMPLICATIONS: None immediate. PROCEDURE: Informed written consent was obtained from the patient after a discussion of the risks, benefits and alternatives to treatment. A timeout was performed prior to the initiation of the procedure. Initial ultrasound scanning demonstrates a large amount of ascites within the right lower abdominal quadrant. The right lower abdomen was prepped and draped in the usual sterile fashion. 1% lidocaine was used for local anesthesia. Following this, a 19 gauge, 10-cm, Yueh catheter was introduced. An ultrasound image was saved for documentation purposes. The paracentesis was performed. The catheter was removed and a dressing was applied. The patient tolerated the procedure well without immediate post procedural complication. Patient received post-procedure intravenous albumin; see nursing notes for details. FINDINGS: A total of approximately 6.0 L of clear yellow fluid was removed. IMPRESSION: Successful ultrasound-guided  paracentesis yielding 6.0 liters of peritoneal fluid. Read by Candiss Norse, PA-C Electronically Signed   By: Sandi Mariscal M.D.   On: 08/19/2019 15:26   IR Paracentesis  Result Date: 08/01/2019 INDICATION: Patient with history of cirrhosis by imaging, ascites; request received for diagnostic and therapeutic paracentesis EXAM: ULTRASOUND GUIDED DIAGNOSTIC AND THERAPEUTIC PARACENTESIS MEDICATIONS: None COMPLICATIONS: None immediate. PROCEDURE: Informed written consent was obtained from the patient after a discussion of the risks, benefits and alternatives to treatment. A timeout was performed prior to the initiation of the procedure. Initial ultrasound scanning demonstrates a large amount of ascites within the right lower abdominal quadrant. The right lower abdomen was prepped and draped in the usual sterile fashion. 1% lidocaine was used for local anesthesia. Following this, a 19 gauge, 7-cm,  Yueh catheter was introduced. An ultrasound image was saved for documentation purposes. The paracentesis was performed. The catheter was removed and a dressing was applied. The patient tolerated the procedure well without immediate post procedural complication. Patient received post-procedure intravenous albumin; see nursing notes for details. FINDINGS: A total of approximately 10 liters of yellow fluid was removed. Samples were sent to the laboratory as requested by the clinical team. IMPRESSION: Successful ultrasound-guided diagnostic and therapeutic paracentesis yielding 10 liters of peritoneal fluid. Read by: Rowe Robert, PA-C Electronically Signed   By: Jerilynn Mages.  Shick M.D.   On: 08/01/2019 11:57      Subjective: "I feel pretty good."  Discharge Exam: Vitals:   08/20/19 1154 08/20/19 1203  BP: (!) 115/42 (!) 118/57  Pulse: 89 89  Resp: (!) 32 19  Temp: 98.1 F (36.7 C)   SpO2: 100% 100%   Vitals:   08/20/19 0931 08/20/19 1008 08/20/19 1154 08/20/19 1203  BP: 93/61 (!) 106/54 (!) 115/42 (!) 118/57   Pulse: 68 72 89 89  Resp: 18 18 (!) 32 19  Temp: 98.1 F (36.7 C) 98.6 F (37 C) 98.1 F (36.7 C)   TempSrc: Oral Oral Axillary   SpO2: 96% 99% 100% 100%  Weight:  65.9 kg    Height:  4' 10.5" (1.486 m)      General: 59 y.o. female resting in bed in NAD Cardiovascular: RRR, +S1, S2, no m/g/r Respiratory: CTABL, no w/r/r, normal WOB GI: BS+, NT, distended but soft MSK: No e/c/c Neuro: A&O x 3, no focal deficits Psyc: Appropriate interaction and affect, calm/cooperative  The results of significant diagnostics from this hospitalization (including imaging, microbiology, ancillary and laboratory) are listed below for reference.     Microbiology: Recent Results (from the past 240 hour(s))  SARS CORONAVIRUS 2 (TAT 6-24 HRS) Nasopharyngeal Nasopharyngeal Swab     Status: None   Collection Time: 08/15/19 11:21 AM   Specimen: Nasopharyngeal Swab  Result Value Ref Range Status   SARS Coronavirus 2 NEGATIVE NEGATIVE Final    Comment: (NOTE) SARS-CoV-2 target nucleic acids are NOT DETECTED. The SARS-CoV-2 RNA is generally detectable in upper and lower respiratory specimens during the acute phase of infection. Negative results do not preclude SARS-CoV-2 infection, do not rule out co-infections with other pathogens, and should not be used as the sole basis for treatment or other patient management decisions. Negative results must be combined with clinical observations, patient history, and epidemiological information. The expected result is Negative. Fact Sheet for Patients: SugarRoll.be Fact Sheet for Healthcare Providers: https://www.woods-mathews.com/ This test is not yet approved or cleared by the [place] FDA and  has been authorized for detection and/or diagnosis of SARS-CoV-2 by FDA under an Emergency Use Authorization (EUA). This EUA will remain  in effect (meaning this test can be used) for the duration of the COVID-19  declaration under Section 56 4(b)(1) of the Act, 21 U.S.C. section 360bbb-3(b)(1), unless the authorization is terminated or revoked sooner. Performed at [facility], [place] [address] [place],  [address]      Labs: BNP (last 3 results) Recent Labs    07/22/19 1124  BNP 43   Basic Metabolic Panel: Recent Labs  Lab 08/16/19 1408 08/17/19 0556 08/18/19 0519 08/19/19 1314 08/20/19 0251  NA 128* 130* 133* 131* 133*  K 6.3* 5.3* 4.5 4.7 4.9  CL 104 106 107 102 104  CO2 17* 16* 18* 19* 22  GLUCOSE 87 86 76 155* 102*  BUN 24* 27*  24* 20 21*  CREATININE 1.31* 1.64* 0.93 0.91 0.94  CALCIUM 8.6* 8.5* 8.3* 8.2* 8.2*  MG  --  1.2* 1.6* 1.3* 1.4*  PHOS 5.1* 5.0*  --  2.3* 2.4*   Liver Function Tests: Recent Labs  Lab 08/16/19 0014 08/16/19 0014 08/16/19 1408 08/17/19 0556 08/18/19 0519 08/19/19 1314 08/20/19 0251  AST 38  --   --   --  28  --  38  ALT 22  --   --   --  15  --  22  ALKPHOS 63  --   --   --  51  --  60  BILITOT 1.7*  --   --   --  1.1  --  0.8  PROT 6.8  --   --   --  5.3*  --  5.1*  ALBUMIN 3.0*   < > 2.4* 2.4* 2.4* 2.5* 2.3*   < > = values in this interval not displayed.   No results for input(s): LIPASE, AMYLASE in the last 168 hours. No results for input(s): AMMONIA in the last 168 hours. CBC: Recent Labs  Lab 08/15/19 1915 08/16/19 0342 08/17/19 0556 08/18/19 0519 08/20/19 0251  WBC 6.3 4.9 4.7 3.5* 3.2*  NEUTROABS  --   --  2.6 1.8 1.6*  HGB 10.7* 9.9* 9.6* 9.1* 9.1*  HCT 33.5* 31.1* 28.5* 27.1* 27.2*  MCV 83.8 84.7 82.6 81.4 82.9  PLT 144* PLATELET CLUMPS NOTED ON SMEAR, UNABLE TO ESTIMATE 109* 87* 83*   Cardiac Enzymes: No results for input(s): CKTOTAL, CKMB, CKMBINDEX, TROPONINI in the last 168 hours. BNP: Invalid input(s): POCBNP CBG: Recent Labs  Lab 08/16/19 0647 08/16/19 1119 08/16/19 2117 08/17/19 0644 08/18/19 0706  GLUCAP 99 84 99 87 71   D-Dimer No results for input(s): DDIMER in the last 72 hours. Hgb  A1c No results for input(s): HGBA1C in the last 72 hours. Lipid Profile No results for input(s): CHOL, HDL, LDLCALC, TRIG, CHOLHDL, LDLDIRECT in the last 72 hours. Thyroid function studies No results for input(s): TSH, T4TOTAL, T3FREE, THYROIDAB in the last 72 hours.  Invalid input(s): FREET3 Anemia work up No results for input(s): VITAMINB12, FOLATE, FERRITIN, TIBC, IRON, RETICCTPCT in the last 72 hours. Urinalysis    Component Value Date/Time   COLORURINE DARK YELLOW 02/09/2018 1149   APPEARANCEUR CLEAR 02/09/2018 1149   LABSPEC 1.013 02/09/2018 1149   PHURINE 6.5 02/09/2018 1149   GLUCOSEU NEGATIVE 02/09/2018 1149   HGBUR NEGATIVE 02/09/2018 1149   Max 02/09/2018 1149   PROTEINUR TRACE (A) 02/09/2018 1149   NITRITE NEGATIVE 02/09/2018 1149   LEUKOCYTESUR NEGATIVE 02/09/2018 1149   Sepsis Labs Invalid input(s): PROCALCITONIN,  WBC,  LACTICIDVEN Microbiology Recent Results (from the past 240 hour(s))  SARS CORONAVIRUS 2 (TAT 6-24 HRS) Nasopharyngeal Nasopharyngeal Swab     Status: None   Collection Time: 08/15/19 11:21 AM   Specimen: Nasopharyngeal Swab  Result Value Ref Range Status   SARS Coronavirus 2 NEGATIVE NEGATIVE Final    Comment: (NOTE) SARS-CoV-2 target nucleic acids are NOT DETECTED. The SARS-CoV-2 RNA is generally detectable in upper and lower respiratory specimens during the acute phase of infection. Negative results do not preclude SARS-CoV-2 infection, do not rule out co-infections with other pathogens, and should not be used as the sole basis for treatment or other patient management decisions. Negative results must be combined with clinical observations, patient history, and epidemiological information. The expected result is Negative. Fact Sheet for Patients: SugarRoll.be Fact Sheet for Healthcare Providers:  https://www.woods-mathews.com/ This test is not yet approved or cleared by the [place] and  has been authorized for detection and/or diagnosis of SARS-CoV-2 by FDA under an Emergency Use Authorization (EUA). This EUA will remain  in effect (meaning this test can be used) for the duration of the COVID-19 declaration under Section 56 4(b)(1) of the Act, 21 U.S.C. section 360bbb-3(b)(1), unless the authorization is terminated or revoked sooner. Performed at [facility], [place] [address], [place], [place] [address]      Time coordinating discharge: 35 minutes  SIGNED:   Jonnie Finner, DO  [facility] 08/20/2019, 2:44 PM   If 7PM-7AM, please contact night-coverage [email or web address]

## 2019-08-20 NOTE — Transfer of Care (Signed)
Immediate Anesthesia Transfer of Care Note  Patient: Meagan Peck  Procedure(s) Performed: GASTRIC VARICES BANDING (N/A ) ESOPHAGOGASTRODUODENOSCOPY (EGD) WITH PROPOFOL (N/A )  Patient Location: PACU  Anesthesia Type:MAC  Level of Consciousness: drowsy  Airway & Oxygen Therapy: Patient Spontanous Breathing and Patient connected to nasal cannula oxygen  Post-op Assessment: Report given to RN and Post -op Vital signs reviewed and stable  Post vital signs: Reviewed and stable  Last Vitals:  Vitals Value Taken Time  BP 115/42 08/20/19 1154  Temp 36.7 C 08/20/19 1154  Pulse 86 08/20/19 1158  Resp 22 08/20/19 1158  SpO2 100 % 08/20/19 1158  Vitals shown include unvalidated device data.  Last Pain:  Vitals:   08/20/19 1154  TempSrc: Axillary  PainSc:          Complications: No apparent anesthesia complications

## 2019-08-20 NOTE — Anesthesia Postprocedure Evaluation (Signed)
Anesthesia Post Note  Patient: Meagan Peck  Procedure(s) Performed: GASTRIC VARICES BANDING (N/A ) ESOPHAGOGASTRODUODENOSCOPY (EGD) WITH PROPOFOL (N/A )     Patient location during evaluation: PACU Anesthesia Type: MAC Level of consciousness: awake and alert Pain management: pain level controlled Vital Signs Assessment: post-procedure vital signs reviewed and stable Respiratory status: spontaneous breathing, nonlabored ventilation, respiratory function stable and patient connected to nasal cannula oxygen Cardiovascular status: stable and blood pressure returned to baseline Postop Assessment: no apparent nausea or vomiting Anesthetic complications: no    Last Vitals:  Vitals:   08/20/19 1154 08/20/19 1203  BP: (!) 115/42 (!) 118/57  Pulse: 89 89  Resp: (!) 32 19  Temp: 36.7 C   SpO2: 100% 100%    Last Pain:  Vitals:   08/20/19 1203  TempSrc:   PainSc: 0-No pain                 Tiajuana Amass

## 2019-08-20 NOTE — Consult Note (Signed)
   [facility] CM Inpatient Consult   08/20/2019  Meagan Peck [birth year] [id number]     Patient is in pending status with [facility] [facility] for chronic disease management services post hospital follow up.  Patient has been assigned to a [name] for the [place] for follow up and support.   Our community based plan of care has focused on disease management and community resource support.    Patient will receive a post hospital call and will be evaluated for assessments and disease process education.      Plan: Patient will be followed by [name] via telephonic follow up.   For additional questions or referrals please contact:   Natividad Brood, RN BSN [facility] Liaison  [phone] business mobile phone Toll free office [phone]  Fax number: [phone] [email or web address]

## 2019-08-20 NOTE — Anesthesia Preprocedure Evaluation (Signed)
Anesthesia Evaluation  Patient identified by MRN, date of birth, ID band Patient awake    Reviewed: Allergy & Precautions, NPO status , [name] Chart, lab work & pertinent test results  Airway Mallampati: II  TM Distance: >3 FB Neck ROM: Full    Dental  (+) Dental Advisory Given   Pulmonary neg pulmonary ROS,    breath sounds clear to auscultation       Cardiovascular hypertension, Pt. on medications  Rhythm:Regular Rate:Normal     Neuro/Psych negative neurological ROS     GI/Hepatic GERD  ,(+) Cirrhosis   Esophageal Varices    ,   Endo/Other  diabetes, Type 2, Insulin Dependent  Renal/GU negative Renal ROS     Musculoskeletal  (+) Arthritis ,   Abdominal   Peds  Hematology  (+) anemia ,   Anesthesia Other Findings   Reproductive/Obstetrics                             Anesthesia Physical Anesthesia Plan  ASA: III  Anesthesia Plan: MAC   Post-op Pain Management:    Induction:   PONV Risk Score and Plan: 2 and Propofol infusion, Ondansetron and Treatment may vary due to age or medical condition  Airway Management Planned: Natural Airway and Simple Face Mask  Additional Equipment:   Intra-op Plan:   Post-operative Plan:   Informed Consent: I have reviewed the patients History and Physical, chart, labs and discussed the procedure including the risks, benefits and alternatives for the proposed anesthesia with the patient or authorized representative who has indicated his/her understanding and acceptance.     Dental advisory given  Plan Discussed with: CRNA  Anesthesia Plan Comments:         Anesthesia Quick Evaluation

## 2019-08-20 NOTE — Op Note (Signed)
[facility] Patient Name: Meagan Peck Procedure Date : 08/20/2019 MRN: [id number] Attending MD: Jackquline Denmark , MD Date of Birth: [birth year] CSN: [id number] Age: 59 Admit Type: Inpatient Procedure:                Upper GI endoscopy Indications:              Esophageal varices, For therapy of esophageal                            varices Providers:                Jackquline Denmark, MD, Jeanella Cara, RN, Theodora Blow, Technician Referring MD:              Medicines:                Monitored Anesthesia Care Complications:            No immediate complications. Estimated Blood Loss:     Estimated blood loss: none. Procedure:                Pre-Anesthesia Assessment:                           - Prior to the procedure, a History and Physical                            was performed, and patient medications and                            allergies were reviewed. The patient's tolerance of                            previous anesthesia was also reviewed. The risks                            and benefits of the procedure and the sedation                            options and risks were discussed with the patient.                            All questions were answered, and informed consent                            was obtained. Prior Anticoagulants: The patient has                            taken no previous anticoagulant or antiplatelet                            agents. ASA Grade Assessment: III - A patient with                            severe systemic  disease. After reviewing the risks                            and benefits, the patient was deemed in                            satisfactory condition to undergo the procedure.                           After obtaining informed consent, the endoscope was                            passed under direct vision. Throughout the                            procedure, the patient's blood pressure,  pulse, and                            oxygen saturations were monitored continuously. The                            GIF-H190 ([id number]) Olympus gastroscope was                            introduced through the mouth, and advanced to the                            second part of duodenum. The upper GI endoscopy was                            accomplished without difficulty. The patient                            tolerated the procedure well. Scope In: Scope Out: Findings:      Grade II, large (> 5 mm) varices were found in the middle third of the       esophagus and in the lower third of the esophagus at GE junction (GOV2).       Five bands were successfully placed with complete eradication, resulting       in deflation of varices, starting just above GE junction. There was no       bleeding during and at the end of the procedure.      Portal hypertensive gastropathy was found in the entire examined       stomach. The retroflexed examination of the cardia did not reveal any       fundal varices.      A single nodule was found in the duodenal bulb. Biopsies were taken with       a cold forceps for histology. Estimated blood loss was minimal. Impression:               - Grade II and large (> 5 mm) esophageal varices.                            Completely eradicated. Banded.                           -  Portal hypertensive gastropathy.                           - Single nodule in duodenal bulb. Biopsied. Recommendation:           - Patient has a contact number available for                            emergencies. The signs and symptoms of potential                            delayed complications were discussed with the                            patient. Return to normal activities tomorrow.                            Written discharge instructions were provided to the                            patient.                           - Clear liquid diet, advance to full liquid diet by                             later today. Then 2 g sodium diet from tomorrow                            morning.                           - Continue present medications.                           - Avoid nonsteroidals.                           - Return to GI clinic in 2 weeks.                           - Recommend repeat EGD with EVL (if needed) in 4 to                            6 weeks. Procedure Code(s):        --- Professional ---                           [id number], Esophagogastroduodenoscopy, flexible,                            transoral; with band ligation of esophageal/gastric                            varices  94854, Esophagogastroduodenoscopy, flexible,                            transoral; with biopsy, single or multiple Diagnosis Code(s):        --- Professional ---                           I85.00, Esophageal varices without bleeding                           K76.6, Portal hypertension                           K31.89, Other diseases of stomach and duodenum CPT copyright 2019 American Medical Association. All rights reserved. The codes documented in this report are preliminary and upon coder review may  be revised to meet current compliance requirements. Jackquline Denmark, MD 08/20/2019 11:59:28 AM This report has been signed electronically. Number of Addenda: 0

## 2019-08-20 NOTE — Interval H&P Note (Signed)
History and Physical Interval Note:  08/20/2019 11:18 AM  Meagan Peck  has presented today for surgery, with the diagnosis of Cirrhosis, esophageal varices.  The various methods of treatment have been discussed with the patient and family. After consideration of risks, benefits and other options for treatment, the patient has consented to  Procedure(s): GASTRIC VARICES BANDING (N/A) ESOPHAGOGASTRODUODENOSCOPY (EGD) WITH PROPOFOL (N/A) as a surgical intervention.  The patient's history has been reviewed, patient examined, no change in status, stable for surgery.  I have reviewed the patient's chart and labs.  Questions were answered to the patient's satisfaction.     Jackquline Denmark

## 2019-08-20 NOTE — Progress Notes (Signed)
DISCHARGE NOTE HOME Meagan Peck to be discharged Home per MD order. Discussed prescriptions and follow up appointments with the patient. Prescriptions given to patient; medication list explained in detail. Patient verbalized understanding.  Skin clean, dry and intact without evidence of skin break down, no evidence of skin tears noted. IV catheter discontinued intact. Site without signs and symptoms of complications. Dressing and pressure applied. Pt denies pain at the site currently. No complaints noted.  Patient free of lines, drains, and wounds.   An After Visit Summary (AVS) was printed and given to the patient. Patient escorted via wheelchair, and discharged home via private auto.  Orville Govern, RN3

## 2019-08-21 ENCOUNTER — Encounter: Payer: Self-pay | Admitting: Osteopathic Medicine

## 2019-08-22 ENCOUNTER — Other Ambulatory Visit: Payer: Self-pay | Admitting: *Deleted

## 2019-08-22 LAB — SURGICAL PATHOLOGY

## 2019-08-22 NOTE — Patient Outreach (Signed)
[facility]) Care Management  08/22/2019  Meagan Peck 23-[name]-1961 [id number]   Transition of care telephone call  Referral received:08/19/19 Initial outreach:08/22/19 Insurance: Focus plan   Initial unsuccessful telephone call to patient's preferred number in order to complete transition of care assessment; no answer, left HIPAA compliant voicemail message requesting return call.   Objective: Per electronic record Meagan.Peck was hospitalized at [facility]  From 4/16-4/21/21 for Hyperkalemia, Hepatic cirrhosis with Ascites, Paracentesis , Endoscopy, Esophageal varices banding,  Comorbidities include: Type 2 Diabetes, GERD, Portal Hypertension, Ascites, Cirrhosis  She was discharged to home on 08/21/19 without the need for home health services or DME.  Plan: This RNCM will route unsuccessful outreach letter with [place] [facility] pamphlet and 24 hour Nurse Advice Line Magnet to [place] [facility] clinical pool to be mailed to patient's home address. This RNCM will attempt another outreach within 4 business days.   Joylene Draft, RN, BSN  [place] Management Coordinator  [phone]- Mobile [phone]- Toll Free Main Office

## 2019-08-23 ENCOUNTER — Other Ambulatory Visit: Payer: Self-pay

## 2019-08-23 ENCOUNTER — Telehealth: Payer: Self-pay | Admitting: Gastroenterology

## 2019-08-23 DIAGNOSIS — R188 Other ascites: Secondary | ICD-10-CM

## 2019-08-23 DIAGNOSIS — K7460 Unspecified cirrhosis of liver: Secondary | ICD-10-CM

## 2019-08-23 NOTE — Telephone Encounter (Signed)
Already spoke with patient and went over surgical path biopsy and recommendations. Patient verbalized understanding.

## 2019-08-23 NOTE — Progress Notes (Signed)
bm 

## 2019-08-27 ENCOUNTER — Other Ambulatory Visit: Payer: Self-pay | Admitting: *Deleted

## 2019-08-27 ENCOUNTER — Telehealth: Payer: Self-pay | Admitting: Gastroenterology

## 2019-08-27 NOTE — Patient Outreach (Signed)
[facility]) Care Management  08/27/2019  CYNDEL GRIFFEY Apr 20, 1961 [id number]   Transition of care call Referral received: 08/19/19 Initial outreach attempt: 08/22/19 Insurance: Focus Plan    2nd unsuccessful telephone call to patient's preferred contact number in order to complete post hospital discharge transition of care assessment , no answer left HIPAA compliant message requesting return call.    Objective:  Per electronic record Mrs.Shepherdwas hospitalized [facility]  From 4/16-4/21/21 for Hyperkalemia, Hepatic cirrhosis with Ascites, Paracentesis , Endoscopy, Esophageal varices banding,  Comorbidities include: Type 2 Diabetes, GERD, Portal Hypertension, Ascites, Cirrhosis  She was discharged to home on 08/21/19 without the need for home health servicesor DME.  Plan If no return call from patient will attempt 3rd outreach in the next 4 business days.   Joylene Draft, RN, BSN  [place] [facility] Coordinator  [phone]- Mobile [phone]- Toll Free Main Office

## 2019-08-28 NOTE — Telephone Encounter (Signed)
Script was sent by Dr. Cherylann Ratel, not Dr. Havery Moros. I asked Dr. Havery Moros and he is not recommending she take this.  I called and Left a message for pt to call back to discuss.  He recommends she contact her PCP if this is something she feels she needs but he did not prescribe.

## 2019-08-29 ENCOUNTER — Other Ambulatory Visit (INDEPENDENT_AMBULATORY_CARE_PROVIDER_SITE_OTHER): Payer: No Typology Code available for payment source

## 2019-08-29 DIAGNOSIS — K7460 Unspecified cirrhosis of liver: Secondary | ICD-10-CM | POA: Diagnosis not present

## 2019-08-29 DIAGNOSIS — R188 Other ascites: Secondary | ICD-10-CM | POA: Diagnosis not present

## 2019-08-29 LAB — BASIC METABOLIC PANEL
BUN: 24 mg/dL — ABNORMAL HIGH (ref 6–23)
CO2: 19 mEq/L (ref 19–32)
Calcium: 8.7 mg/dL (ref 8.4–10.5)
Chloride: 98 mEq/L (ref 96–112)
Creatinine, Ser: 1.15 mg/dL (ref 0.40–1.20)
GFR: 48.68 mL/min — ABNORMAL LOW (ref >60.00)
Glucose, Bld: 189 mg/dL — ABNORMAL HIGH (ref 70–99)
Potassium: 3.3 mEq/L — ABNORMAL LOW (ref 3.5–5.1)
Sodium: 127 mEq/L — ABNORMAL LOW (ref 135–145)

## 2019-08-29 MED FILL — ESCITALOPRAM 10 MG TABLET: 10 | 90 days supply | Qty: 90 | Fill #1

## 2019-08-29 MED FILL — ROSUVASTATIN CALCIUM 20 MG: 20 | 62 days supply | Qty: 36 | Fill #3

## 2019-08-29 MED FILL — FUROSEMIDE 40 MG TAB: 40 | 30 days supply | Qty: 30 | Fill #1

## 2019-08-29 MED FILL — LISINOPRIL 40 MG TABLET: 40 | 90 days supply | Qty: 90 | Fill #1

## 2019-08-29 MED FILL — SPIRONOLACTONE 50 MG TABS: 50 | 30 days supply | Qty: 30 | Fill #1

## 2019-08-30 ENCOUNTER — Other Ambulatory Visit: Payer: Self-pay | Admitting: *Deleted

## 2019-08-30 ENCOUNTER — Other Ambulatory Visit: Payer: Self-pay

## 2019-08-30 DIAGNOSIS — K7460 Unspecified cirrhosis of liver: Secondary | ICD-10-CM

## 2019-08-30 DIAGNOSIS — R188 Other ascites: Secondary | ICD-10-CM

## 2019-08-30 NOTE — Patient Outreach (Signed)
[facility]) Care Management  08/30/2019  Meagan Peck Jun 24, 1960 [id number]   Transition of care call Referral received: 08/19/19 Initial outreach attempt: 08/22/19 Insurance: Focus plan   Third unsuccessful telephone call to patient's preferred contact number in order to complete post hospital discharge transition of care assessment; no answer, left HIPAA compliant message requesting return call.   Objective: [facility], Hepatic cirrhosis with Ascites, Paracentesis , Endoscopy, Esophageal varices banding,Comorbidities include: Type 2 Diabetes, GERD, Portal Hypertension, Ascites, Cirrhosis  Shewas discharged to home on 4/21/21without the need for home health servicesor DME.   Plan: If no return call from patient, will close case to [place] services in 10 business days after initial post hospital discharge outreach, on 4/22 /21.   KJoylene Draft RN, BSN  [place] Coordinator  [phone] Mobile [phone] Toll Free Main Office

## 2019-09-02 ENCOUNTER — Other Ambulatory Visit: Payer: Self-pay

## 2019-09-02 ENCOUNTER — Telehealth: Payer: Self-pay

## 2019-09-02 DIAGNOSIS — R188 Other ascites: Secondary | ICD-10-CM

## 2019-09-02 DIAGNOSIS — K7460 Unspecified cirrhosis of liver: Secondary | ICD-10-CM

## 2019-09-02 DIAGNOSIS — I8500 Esophageal varices without bleeding: Secondary | ICD-10-CM

## 2019-09-02 NOTE — Telephone Encounter (Signed)
Called to see if pt could do another EGD w/ banding of varices at [facility] on Thursday, 5-20 at 2pm to arrive at 12:30pm. Would need to be Covid tested on Monday, 5-17.  [name] number rang and then went busy. Sent patient message via [place].

## 2019-09-02 NOTE — Telephone Encounter (Signed)
Patient said she can have EGD on Thursday 09-19-19 at 2:00pm. She has an appt with Darrell Jewel this Friday. Will schedule case and provide instructions.

## 2019-09-02 NOTE — Telephone Encounter (Signed)
Case scheduled and Covid test scheduled. Instructions printed for Friday's visit with J. Lemmon.

## 2019-09-02 NOTE — Addendum Note (Signed)
Addended by: Roetta Sessions on: 09/02/2019 05:07 PM   Modules accepted: Orders

## 2019-09-03 ENCOUNTER — Other Ambulatory Visit (INDEPENDENT_AMBULATORY_CARE_PROVIDER_SITE_OTHER): Payer: No Typology Code available for payment source

## 2019-09-03 DIAGNOSIS — K7460 Unspecified cirrhosis of liver: Secondary | ICD-10-CM

## 2019-09-03 DIAGNOSIS — R188 Other ascites: Secondary | ICD-10-CM | POA: Diagnosis not present

## 2019-09-03 LAB — BASIC METABOLIC PANEL
BUN: 24 mg/dL — ABNORMAL HIGH (ref 6–23)
CO2: 20 mEq/L (ref 19–32)
Calcium: 9 mg/dL (ref 8.4–10.5)
Chloride: 102 mEq/L (ref 96–112)
Creatinine, Ser: 1.08 mg/dL (ref 0.40–1.20)
GFR: 52.34 mL/min — ABNORMAL LOW (ref >60.00)
Glucose, Bld: 159 mg/dL — ABNORMAL HIGH (ref 70–99)
Potassium: 3.8 mEq/L (ref 3.5–5.1)
Sodium: 132 mEq/L — ABNORMAL LOW (ref 135–145)

## 2019-09-04 ENCOUNTER — Other Ambulatory Visit: Payer: Self-pay | Admitting: *Deleted

## 2019-09-04 NOTE — Patient Outreach (Signed)
[facility]) Care Management  09/04/2019  Meagan Peck January 17, 1961 [id number]   Transition of care /Case Closure Unsuccessful outreach    Referral received: 08/19/19 Initial outreach:08/22/19 Insurance: Focus plan   Unable to complete post Peck discharge transition of care assessment. No return call form patient after 3 call attempts and no response to request to contact RN Care Coordinator in unsuccessful outreach letter mailed to home on 08/22/19  Objective: [facility], Hepatic cirrhosis with Ascites, Paracentesis , Endoscopy, Esophageal varices banding,Comorbidities include: Type 2 Diabetes, GERD, Portal Hypertension, Ascites, Cirrhosis  Shewas discharged to home on 4/21/21without the need for home health servicesor DME.  Plan Case closed to Meagan Peck [employer]as it has been 10 days since initial post discharge outreach attempt.    KJoylene Draft RN, BSN  [place] Coordinator  [phone] Mobile [phone] Toll Free Main Office

## 2019-09-06 ENCOUNTER — Other Ambulatory Visit (INDEPENDENT_AMBULATORY_CARE_PROVIDER_SITE_OTHER): Payer: No Typology Code available for payment source

## 2019-09-06 ENCOUNTER — Encounter: Payer: Self-pay | Admitting: Physician Assistant

## 2019-09-06 ENCOUNTER — Ambulatory Visit (INDEPENDENT_AMBULATORY_CARE_PROVIDER_SITE_OTHER): Payer: No Typology Code available for payment source | Admitting: Physician Assistant

## 2019-09-06 VITALS — BP 120/60 | HR 92 | Temp 98.0°F | Ht <= 58 in | Wt 147.2 lb

## 2019-09-06 DIAGNOSIS — K7581 Nonalcoholic steatohepatitis (NASH): Secondary | ICD-10-CM | POA: Diagnosis not present

## 2019-09-06 DIAGNOSIS — K7031 Alcoholic cirrhosis of liver with ascites: Secondary | ICD-10-CM | POA: Diagnosis not present

## 2019-09-06 DIAGNOSIS — R188 Other ascites: Secondary | ICD-10-CM

## 2019-09-06 DIAGNOSIS — I8500 Esophageal varices without bleeding: Secondary | ICD-10-CM

## 2019-09-06 DIAGNOSIS — K7460 Unspecified cirrhosis of liver: Secondary | ICD-10-CM

## 2019-09-06 LAB — COMPREHENSIVE METABOLIC PANEL
ALT: 27 U/L (ref 0–35)
AST: 41 U/L — ABNORMAL HIGH (ref 0–37)
Albumin: 3.3 g/dL — ABNORMAL LOW (ref 3.5–5.2)
Alkaline Phosphatase: 93 U/L (ref 39–117)
BUN: 25 mg/dL — ABNORMAL HIGH (ref 6–23)
CO2: 18 mEq/L — ABNORMAL LOW (ref 19–32)
Calcium: 9.3 mg/dL (ref 8.4–10.5)
Chloride: 103 mEq/L (ref 96–112)
Creatinine, Ser: 1.16 mg/dL (ref 0.40–1.20)
GFR: 48.2 mL/min — ABNORMAL LOW (ref >60.00)
Glucose, Bld: 126 mg/dL — ABNORMAL HIGH (ref 70–99)
Potassium: 3.5 mEq/L (ref 3.5–5.1)
Sodium: 132 mEq/L — ABNORMAL LOW (ref 135–145)
Total Bilirubin: 1.4 mg/dL — ABNORMAL HIGH (ref 0.2–1.2)
Total Protein: 6.9 g/dL (ref 6.0–8.3)

## 2019-09-06 NOTE — Progress Notes (Signed)
Agree with assessment as outlined with the following thoughts: Complicated case - suspected NASH cirrhosis complicated by varices and ascites. The ascites has been difficult to manage - on higher doses of diuretics she had hyperkalemia from aldactone that was difficult to treat and required hospitalization. Now on lasix monotherapy, however had low Na and low K on 42m / day dosing. On lower dose lasix 242m/ day her Na and K have been stable but now hard time with recurrent ascites. I agree with large volume paracentesis with albumin at this time if > 5 L removed.   Moving forward, would try increasing lasix to 4050mne day, then 16m44me next, and alternate back and forth, repeat BMET next week. I would not reintroduce aldactone anytime soon given severe hyperkalemia with that previously. If she can't tolerate diuretics and continues to require large volume paracentesis to manage the ascites, we may need to consider TIPS although her diagnosis of cirrhosis is relatively new. That would treat the ascites and large varices. Will await BMET next week and repeat EGD with me at the hospital for additional banding as needed.   She should see me in the office for follow up in 4 weeks otherwise for assessment - should be seen frequently until her ascites is more stable. We will need to continue monitoring her electrolytes closely over the upcoming weeks.  Meagan Peck can pass along recommendations regarding diuretic dosing and repeat BMET in one week? Thank you

## 2019-09-06 NOTE — H&P (View-Only) (Signed)
Chief Complaint: Follow-up [name] cirrhosis with ascites  HPI:    Mrs. [place] is a 59 year old Caucasian female with a past medical history as listed below including [place] cirrhosis with ascites, known to Dr. Havery Moros, who presents clinic today for follow-up.      07/31/2019 patient initially seen in clinic by me for new diagnosis of cirrhosis.  She was set up for an EGD and colonoscopy and started on Lasix and spironolactone as well as had multiple other liver labs.    08/19/2019 patient presented to the hospital with esophageal varices, cirrhosis, ascites and hyperkalemia.  It was discussed that time she had a negative/unrevealing studies in her liver work-up and was diagnosed with [name] cirrhosis.  (Other GI studies and work-up as below) Dr. Havery Moros had been managing her diuretics of Lasix and Aldactone.  She had developed hypokalemia and was taken off of Aldactone.  Underwent EGD 08/20/2019 with grade 2 enlarged greater than 5 mm esophageal varices which were completely eradicated, banded, portal hypertensive gastropathy and a single nodule in the duodenal bulb.  Is recommended patient have repeat EGD in 4 to 6 weeks.    09/03/2019 BMP with a sodium of 132 (improved from 4/29 at 127), normal potassium.    Today, the patient presents to clinic accompanied by her husband and tells me that her abdominal distention has gotten worse.  Explains that ever since her Lasix was cut to a half a tab once a day over a week ago she does not really feel like she is urinating any more than she normally would.  Explained that her abdomen is now getting uncomfortable with the distention and her umbilical hernia is "poking out and uncomfortable".  Also tells me that she constantly feels like she has something in the back of her throat that she cannot get rid of.  Denies heartburn or reflux.  She is currently on a low-sodium diet.    Denies fever, chills or blood in her stool.  GI history: 08/01/2019 paracentesis: 10  liters removed.  Received dose Albumin post tap.  Albumin less than 1.  WBCs but no organisms on Gram stain.  No growth on culture.  Cell count/differential negative for SBP.  She lost 28#after the paracentesis 08/13/19 EGD w Multiple columns large mid and lower esophageal varices, erythematous near GE junction.  Atrophic/congested mucosa throughout the stomach suspect due to portal hypertension gastropathy, biopsies obtained.  Suspicion of small, type II gastroesophageal varices in the cardia. Pathology: chronic inactive gastritis, no H. pylori, dysplasia, malignancy. 08/13/2019 colonoscopy.  A few small, nonbleeding sigmoid, descending, IC valve AVMs, were not treated or ablated.  Small internal hemorrhoids.   Past Medical History:  Diagnosis Date  . Allergy   . Arthritis   . Cataract   . Diabetes mellitus without complication ([place])   . GERD (gastroesophageal reflux disease)   . Hypertension   . Iron deficiency anemia 09/26/2018    Past Surgical History:  Procedure Laterality Date  . BIOPSY  08/20/2019   Procedure: BIOPSY;  Surgeon: Jackquline Denmark, MD;  Location: [facility];  Service: Gastroenterology;;  . CARPAL TUNNEL RELEASE Right   . CESAREAN SECTION    . CHOLECYSTECTOMY    . ESOPHAGOGASTRODUODENOSCOPY (EGD) WITH PROPOFOL N/A 08/20/2019   Procedure: ESOPHAGOGASTRODUODENOSCOPY (EGD) WITH PROPOFOL;  Surgeon: Jackquline Denmark, MD;  Location: [facility];  Service: Gastroenterology;  Laterality: N/A;  . GASTRIC VARICES BANDING N/A 08/20/2019   Procedure: GASTRIC VARICES BANDING;  Surgeon: Jackquline Denmark, MD;  Location: [facility];  Service: Gastroenterology;  Laterality: N/A;  . IR PARACENTESIS  08/01/2019  . IR PARACENTESIS  08/19/2019  . KNEE ARTHROSCOPY Right 12/10/2015   Procedure: RIGHT KNEE ARTHROSCOPY WITH PARTIAL MEDIAL MENISCECTOMY;  Surgeon: Mcarthur Rossetti, MD;  Location: [facility];  Service: Orthopedics;  Laterality: Right;  . KNEE ARTHROSCOPY W/ MENISCAL REPAIR Bilateral     . MENISCUS REPAIR Left     Current Outpatient Medications  Medication Sig Dispense Refill  . ACCU-CHEK FASTCLIX LANCETS MISC Check fsbs TID    . aspirin 81 MG tablet Take 1 tablet (81 mg total) by mouth daily. 30 tablet   . blood glucose meter kit and supplies KIT Dispense based on patient and insurance preference. Use up to four times daily as directed. Please include lancets, test strips, control solution. 1 each 99  . cetirizine (ZYRTEC) 10 MG tablet Take 1 tablet (10 mg total) by mouth daily. 90 tablet 3  . Cholecalciferol (VITAMIN D3) 5000 UNITS TABS Take 5,000 Units by mouth daily.     . diclofenac sodium (VOLTAREN) 1 % GEL Apply 4 g topically 4 (four) times daily. To affected joint. (Patient taking differently: Apply 4 g topically 4 (four) times daily as needed (For knee pain). ) 100 g 11  . escitalopram (LEXAPRO) 10 MG tablet TAKE 1 TABLET (10 MG TOTAL) BY MOUTH EVERY EVENING. (Patient taking differently: Take 10 mg by mouth at bedtime. ) 90 tablet 1  . ferrous sulfate 325 (65 FE) MG EC tablet Take 1 tablet (325 mg total) by mouth 2 (two) times daily with a meal. 180 tablet 1  . furosemide (LASIX) 40 MG tablet Take 1 tablet (40 mg total) by mouth daily. 30 tablet 5  . glucose blood (ACCU-CHEK GUIDE) test strip Use to check blood sugar 3 times daily. Dx:E08.00 100 each 12  . Insulin Pen Needle (ULTICARE SHORT PEN NEEDLES) 31G X 8 MM MISC USE AS DIRECTED TO INJECT LANTUS AND BYETTA 100 each 12  . Krill Oil 500 MG CAPS Take 500 mg by mouth daily.    . magnesium oxide (MAG-OX) 400 MG tablet Take 1 tablet (400 mg total) by mouth 2 (two) times daily. 180 tablet 3  . megestrol (MEGACE ES) 625 MG/5ML suspension Take 5 mLs (625 mg total) by mouth daily. 150 mL 0  . metFORMIN (GLUCOPHAGE) 1000 MG tablet TAKE 1 TABLET (1,000 MG TOTAL) BY MOUTH 2 (TWO) TIMES DAILY WITH A MEAL. 90 tablet 1  . OZEMPIC, 0.25 OR 0.5 MG/DOSE, 2 MG/1.5ML SOPN INJECT 0.5 MG INTO THE SKIN ONCE A WEEK. (Patient taking  differently: Inject 0.5 mg into the skin every Sunday. ) 1.5 mL 5  . rosuvastatin (CRESTOR) 20 MG tablet Take 1 tablet (20 mg total) by mouth 4 (four) times a week. (Patient taking differently: Take 20 mg by mouth See admin instructions. Take on Monday, Tuesday, Thursday, and Friday.) 90 tablet 1   No current facility-administered medications for this visit.    Allergies as of 09/06/2019 - Review Complete 09/06/2019  Allergen Reaction Noted  . Penicillins Anaphylaxis 02/01/2013  . Atrovent [ipratropium] Palpitations 08/11/2017  . Naproxen Rash 02/01/2013  . Quinine derivatives Rash 04/14/2014  . Sulfa antibiotics Rash 02/01/2013    Family History  Problem Relation Age of Onset  . Hypertension Mother   . COPD Mother   . Alcoholism Father   . Heart attack Father   . Diabetes Brother   . Diabetes Paternal Grandmother   . Heart disease Paternal Grandmother   .  Alcoholism Paternal Uncle   . Alcoholism Maternal Uncle   . Heart disease Maternal Grandmother   . Heart disease Maternal Grandfather   . Heart disease Paternal Grandfather     Social History   Socioeconomic History  . Marital status: Married    Spouse name: Dandria Griego  . Number of children: 1  . Years of education: Not on file  . Highest education level: Associate degree: academic program  Occupational History  . Not on file  Tobacco Use  . Smoking status: Never Smoker  . Smokeless tobacco: Never Used  Substance and Sexual Activity  . Alcohol use: No  . Drug use: No  . Sexual activity: Yes    Partners: Male    Birth control/protection: None  Other Topics Concern  . Not on file  Social History Narrative  . Not on file   Social Determinants of Health   Financial Resource Strain:   . Difficulty of Paying Living Expenses:   Food Insecurity:   . Worried About Charity fundraiser in the Last Year:   . Arboriculturist in the Last Year:   Transportation Needs:   . Film/video editor (Medical):   Marland Kitchen  Lack of Transportation (Non-Medical):   Physical Activity:   . Days of Exercise per Week:   . Minutes of Exercise per Session:   Stress:   . Feeling of Stress :   Social Connections:   . Frequency of Communication with Friends and Family:   . Frequency of Social Gatherings with Friends and Family:   . Attends Religious Services:   . Active Member of Clubs or Organizations:   . Attends Archivist Meetings:   Marland Kitchen Marital Status:   Intimate Partner Violence:   . Fear of Current or Ex-Partner:   . Emotionally Abused:   Marland Kitchen Physically Abused:   . Sexually Abused:     Review of Systems:    Constitutional: No weight loss, fever or chills Cardiovascular: No chest pain Respiratory: No SOB Gastrointestinal: See HPI and otherwise negative   Physical Exam:  Vital signs: BP 120/60 (BP Location: Left Arm, Patient Position: Sitting, Cuff Size: Normal)   Pulse 92   Temp 98 F (36.7 C)   Ht 4' 9.75" (1.467 m)   Wt 147 lb 4 oz (66.8 kg)   LMP 12/03/2013   BMI 31.04 kg/m   Constitutional:   Pleasant Caucasian female appears to be in NAD, Well developed, Well nourished, alert and cooperative Respiratory: Respirations even and unlabored. Lungs clear to auscultation bilaterally.   No wheezes, crackles, or rhonchi.  Cardiovascular: Normal S1, S2. No MRG. Regular rate and rhythm. No peripheral edema, cyanosis or pallor.  Gastrointestinal:  Soft, Marked abdominal distension, nontender. No rebound or guarding. Normal bowel sounds. No appreciable masses or hepatomegaly. Rectal:  Not performed.  Psychiatric:  Demonstrates good judgement and reason without abnormal affect or behaviors.  RELEVANT LABS AND IMAGING: CBC    Component Value Date/Time   WBC 3.2 (L) 08/20/2019 0251   RBC 3.28 (L) 08/20/2019 0251   HGB 9.1 (L) 08/20/2019 0251   HCT 27.2 (L) 08/20/2019 0251   PLT 83 (L) 08/20/2019 0251   MCV 82.9 08/20/2019 0251   MCH 27.7 08/20/2019 0251   MCHC 33.5 08/20/2019 0251   RDW  16.4 (H) 08/20/2019 0251   LYMPHSABS 1.1 08/20/2019 0251   MONOABS 0.4 08/20/2019 0251   EOSABS 0.1 08/20/2019 0251   BASOSABS 0.0 08/20/2019 0251  CMP     Component Value Date/Time   NA 132 (L) 09/03/2019 1608   K 3.8 09/03/2019 1608   CL 102 09/03/2019 1608   CO2 20 09/03/2019 1608   GLUCOSE 159 (H) 09/03/2019 1608   BUN 24 (H) 09/03/2019 1608   CREATININE 1.08 09/03/2019 1608   CREATININE 0.72 07/22/2019 1130   CALCIUM 9.0 09/03/2019 1608   PROT 5.1 (L) 08/20/2019 0251   ALBUMIN 2.3 (L) 08/20/2019 0251   AST 38 08/20/2019 0251   ALT 22 08/20/2019 0251   ALKPHOS 60 08/20/2019 0251   BILITOT 0.8 08/20/2019 0251   GFRNONAA >60 08/20/2019 0251   GFRNONAA 94 07/22/2019 1130   GFRAA >60 08/20/2019 0251   GFRAA 108 07/22/2019 1130    Assessment: 1.  NASH decompensated liver cirrhosis with portal hypertension: Increasing ascites, now only on Lasix 20 mg daily due to hypokalemia 2.  Esophageal varices: Last banded 08/20/2019 with recommendation for repeat banding in 4 weeks, patient set up for repeat EGD 5/20  Plan: 1.  Continue Lasix 20 mg daily for now. 2.  Recheck CMP. 3.  Scheduled patient for therapeutic paracentesis up to 10 L with 25 g albumin for every liter over five drawn off. 4.  Patient will follow with Dr. Havery Moros for EGD on 09/19/2019. 5.  May need to discuss TIPS procedure or other options for the patient given refractory ascites at this point. 6.  Patient to follow in clinic per recommendations from Dr. Havery Moros after time of procedure.  Ellouise Newer, PA-C [facility] 09/06/2019, 2:19 PM  Cc: [name], DO

## 2019-09-06 NOTE — H&P (View-Only) (Signed)
Agree with assessment as outlined with the following thoughts: Complicated case - suspected NASH cirrhosis complicated by varices and ascites. The ascites has been difficult to manage - on higher doses of diuretics she had hyperkalemia from aldactone that was difficult to treat and required hospitalization. Now on lasix monotherapy, however had low Na and low K on 101m / day dosing. On lower dose lasix 219m/ day her Na and K have been stable but now hard time with recurrent ascites. I agree with large volume paracentesis with albumin at this time if > 5 L removed.   Moving forward, would try increasing lasix to 4032mne day, then 58m74me next, and alternate back and forth, repeat BMET next week. I would not reintroduce aldactone anytime soon given severe hyperkalemia with that previously. If she can't tolerate diuretics and continues to require large volume paracentesis to manage the ascites, we may need to consider TIPS although her diagnosis of cirrhosis is relatively new. That would treat the ascites and large varices. Will await BMET next week and repeat EGD with me at the hospital for additional banding as needed.   She should see me in the office for follow up in 4 weeks otherwise for assessment - should be seen frequently until her ascites is more stable. We will need to continue monitoring her electrolytes closely over the upcoming weeks.  JennAnderson Malta can pass along recommendations regarding diuretic dosing and repeat BMET in one week? Thank you

## 2019-09-06 NOTE — Patient Instructions (Signed)
If you are age 57 or older, your body mass index should be between 23-30. Your Body mass index is 31.04 kg/m. If this is out of the aforementioned range listed, please consider follow up with your Primary Care Provider.  If you are age 62 or younger, your body mass index should be between 19-25. Your Body mass index is 31.04 kg/m. If this is out of the aformentioned range listed, please consider follow up with your Primary Care Provider.   You have been scheduled for an abdominal paracentesis at [facility] radiology (1st floor of hospital) on Monday 09/09/19 at 9 am. Please arrive at least 15 minutes prior to your appointment time for registration. Should you need to reschedule this appointment for any reason, please call our office at [phone].

## 2019-09-06 NOTE — Progress Notes (Signed)
Chief Complaint: Follow-up Meagan Peck cirrhosis with ascites  HPI:    Meagan Peck is a 59 year old Caucasian female with a past medical history as listed below including [place] cirrhosis with ascites, known to Dr. Havery Moros, who presents clinic today for follow-up.      07/31/2019 patient initially seen in clinic by me for new diagnosis of cirrhosis.  She was set up for an EGD and colonoscopy and started on Lasix and spironolactone as well as had multiple other liver labs.    08/19/2019 patient presented to the hospital with esophageal varices, cirrhosis, ascites and hyperkalemia.  It was discussed that time she had a negative/unrevealing studies in her liver work-up and was diagnosed with Meagan Peck cirrhosis.  (Other GI studies and work-up as below) Dr. Havery Moros had been managing her diuretics of Lasix and Aldactone.  She had developed hypokalemia and was taken off of Aldactone.  Underwent EGD 08/20/2019 with grade 2 enlarged greater than 5 mm esophageal varices which were completely eradicated, banded, portal hypertensive gastropathy and a single nodule in the duodenal bulb.  Is recommended patient have repeat EGD in 4 to 6 weeks.    09/03/2019 BMP with a sodium of 132 (improved from 4/29 at 127), normal potassium.    Today, the patient presents to clinic accompanied by her husband and tells me that her abdominal distention has gotten worse.  Explains that ever since her Lasix was cut to a half a tab once a day over a week ago she does not really feel like she is urinating any more than she normally would.  Explained that her abdomen is now getting uncomfortable with the distention and her umbilical hernia is "poking out and uncomfortable".  Also tells me that she constantly feels like she has something in the back of her throat that she cannot get rid of.  Denies heartburn or reflux.  She is currently on a low-sodium diet.    Denies fever, chills or blood in her stool.  GI history: 08/01/2019 paracentesis: 10  liters removed.  Received dose Albumin post tap.  Albumin less than 1.  WBCs but no organisms on Gram stain.  No growth on culture.  Cell count/differential negative for SBP.  She lost 28#after the paracentesis 08/13/19 EGD w Multiple columns large mid and lower esophageal varices, erythematous near GE junction.  Atrophic/congested mucosa throughout the stomach suspect due to portal hypertension gastropathy, biopsies obtained.  Suspicion of small, type II gastroesophageal varices in the cardia. Pathology: chronic inactive gastritis, no H. pylori, dysplasia, malignancy. 08/13/2019 colonoscopy.  A few small, nonbleeding sigmoid, descending, IC valve AVMs, were not treated or ablated.  Small internal hemorrhoids.   Past Medical History:  Diagnosis Date  . Allergy   . Arthritis   . Cataract   . Diabetes mellitus without complication ([place])   . GERD (gastroesophageal reflux disease)   . Hypertension   . Iron deficiency anemia 09/26/2018    Past Surgical History:  Procedure Laterality Date  . BIOPSY  08/20/2019   Procedure: BIOPSY;  Surgeon: Jackquline Denmark, MD;  Location: [facility];  Service: Gastroenterology;;  . CARPAL TUNNEL RELEASE Right   . CESAREAN SECTION    . CHOLECYSTECTOMY    . ESOPHAGOGASTRODUODENOSCOPY (EGD) WITH PROPOFOL N/A 08/20/2019   Procedure: ESOPHAGOGASTRODUODENOSCOPY (EGD) WITH PROPOFOL;  Surgeon: Jackquline Denmark, MD;  Location: [facility];  Service: Gastroenterology;  Laterality: N/A;  . GASTRIC VARICES BANDING N/A 08/20/2019   Procedure: GASTRIC VARICES BANDING;  Surgeon: Jackquline Denmark, MD;  Location: [facility];  Service: Gastroenterology;  Laterality: N/A;  . IR PARACENTESIS  08/01/2019  . IR PARACENTESIS  08/19/2019  . KNEE ARTHROSCOPY Right 12/10/2015   Procedure: RIGHT KNEE ARTHROSCOPY WITH PARTIAL MEDIAL MENISCECTOMY;  Surgeon: Mcarthur Rossetti, MD;  Location: [facility];  Service: Orthopedics;  Laterality: Right;  . KNEE ARTHROSCOPY W/ MENISCAL REPAIR Bilateral     . MENISCUS REPAIR Left     Current Outpatient Medications  Medication Sig Dispense Refill  . ACCU-CHEK FASTCLIX LANCETS MISC Check fsbs TID    . aspirin 81 MG tablet Take 1 tablet (81 mg total) by mouth daily. 30 tablet   . blood glucose meter kit and supplies KIT Dispense based on patient and insurance preference. Use up to four times daily as directed. Please include lancets, test strips, control solution. 1 each 99  . cetirizine (ZYRTEC) 10 MG tablet Take 1 tablet (10 mg total) by mouth daily. 90 tablet 3  . Cholecalciferol (VITAMIN D3) 5000 UNITS TABS Take 5,000 Units by mouth daily.     . diclofenac sodium (VOLTAREN) 1 % GEL Apply 4 g topically 4 (four) times daily. To affected joint. (Patient taking differently: Apply 4 g topically 4 (four) times daily as needed (For knee pain). ) 100 g 11  . escitalopram (LEXAPRO) 10 MG tablet TAKE 1 TABLET (10 MG TOTAL) BY MOUTH EVERY EVENING. (Patient taking differently: Take 10 mg by mouth at bedtime. ) 90 tablet 1  . ferrous sulfate 325 (65 FE) MG EC tablet Take 1 tablet (325 mg total) by mouth 2 (two) times daily with a meal. 180 tablet 1  . furosemide (LASIX) 40 MG tablet Take 1 tablet (40 mg total) by mouth daily. 30 tablet 5  . glucose blood (ACCU-CHEK GUIDE) test strip Use to check blood sugar 3 times daily. Dx:E08.00 100 each 12  . Insulin Pen Needle (ULTICARE SHORT PEN NEEDLES) 31G X 8 MM MISC USE AS DIRECTED TO INJECT LANTUS AND BYETTA 100 each 12  . Krill Oil 500 MG CAPS Take 500 mg by mouth daily.    . magnesium oxide (MAG-OX) 400 MG tablet Take 1 tablet (400 mg total) by mouth 2 (two) times daily. 180 tablet 3  . megestrol (MEGACE ES) 625 MG/5ML suspension Take 5 mLs (625 mg total) by mouth daily. 150 mL 0  . metFORMIN (GLUCOPHAGE) 1000 MG tablet TAKE 1 TABLET (1,000 MG TOTAL) BY MOUTH 2 (TWO) TIMES DAILY WITH A MEAL. 90 tablet 1  . OZEMPIC, 0.25 OR 0.5 MG/DOSE, 2 MG/1.5ML SOPN INJECT 0.5 MG INTO THE SKIN ONCE A WEEK. (Patient taking  differently: Inject 0.5 mg into the skin every Sunday. ) 1.5 mL 5  . rosuvastatin (CRESTOR) 20 MG tablet Take 1 tablet (20 mg total) by mouth 4 (four) times a week. (Patient taking differently: Take 20 mg by mouth See admin instructions. Take on Monday, Tuesday, Thursday, and Friday.) 90 tablet 1   No current facility-administered medications for this visit.    Allergies as of 09/06/2019 - Review Complete 09/06/2019  Allergen Reaction Noted  . Penicillins Anaphylaxis 02/01/2013  . Atrovent [ipratropium] Palpitations 08/11/2017  . Naproxen Rash 02/01/2013  . Quinine derivatives Rash 04/14/2014  . Sulfa antibiotics Rash 02/01/2013    Family History  Problem Relation Age of Onset  . Hypertension Mother   . COPD Mother   . Alcoholism Father   . Heart attack Father   . Diabetes Brother   . Diabetes Paternal Grandmother   . Heart disease Paternal Grandmother   .  Alcoholism Paternal Uncle   . Alcoholism Maternal Uncle   . Heart disease Maternal Grandmother   . Heart disease Maternal Grandfather   . Heart disease Paternal Grandfather     Social History   Socioeconomic History  . Marital status: Married    Spouse name: Dandria Griego  . Number of children: 1  . Years of education: Not on file  . Highest education level: Associate degree: academic program  Occupational History  . Not on file  Tobacco Use  . Smoking status: Never Smoker  . Smokeless tobacco: Never Used  Substance and Sexual Activity  . Alcohol use: No  . Drug use: No  . Sexual activity: Yes    Partners: Male    Birth control/protection: None  Other Topics Concern  . Not on file  Social History Narrative  . Not on file   Social Determinants of Health   Financial Resource Strain:   . Difficulty of Paying Living Expenses:   Food Insecurity:   . Worried About Charity fundraiser in the Last Year:   . Arboriculturist in the Last Year:   Transportation Needs:   . Film/video editor (Medical):   Marland Kitchen  Lack of Transportation (Non-Medical):   Physical Activity:   . Days of Exercise per Week:   . Minutes of Exercise per Session:   Stress:   . Feeling of Stress :   Social Connections:   . Frequency of Communication with Friends and Family:   . Frequency of Social Gatherings with Friends and Family:   . Attends Religious Services:   . Active Member of Clubs or Organizations:   . Attends Archivist Meetings:   Marland Kitchen Marital Status:   Intimate Partner Violence:   . Fear of Current or Ex-Partner:   . Emotionally Abused:   Marland Kitchen Physically Abused:   . Sexually Abused:     Review of Systems:    Constitutional: No weight loss, fever or chills Cardiovascular: No chest pain Respiratory: No SOB Gastrointestinal: See HPI and otherwise negative   Physical Exam:  Vital signs: BP 120/60 (BP Location: Left Arm, Patient Position: Sitting, Cuff Size: Normal)   Pulse 92   Temp 98 F (36.7 C)   Ht 4' 9.75" (1.467 m)   Wt 147 lb 4 oz (66.8 kg)   LMP 12/03/2013   BMI 31.04 kg/m   Constitutional:   Pleasant Caucasian female appears to be in NAD, Well developed, Well nourished, alert and cooperative Respiratory: Respirations even and unlabored. Lungs clear to auscultation bilaterally.   No wheezes, crackles, or rhonchi.  Cardiovascular: Normal S1, S2. No MRG. Regular rate and rhythm. No peripheral edema, cyanosis or pallor.  Gastrointestinal:  Soft, Marked abdominal distension, nontender. No rebound or guarding. Normal bowel sounds. No appreciable masses or hepatomegaly. Rectal:  Not performed.  Psychiatric:  Demonstrates good judgement and reason without abnormal affect or behaviors.  RELEVANT LABS AND IMAGING: CBC    Component Value Date/Time   WBC 3.2 (L) 08/20/2019 0251   RBC 3.28 (L) 08/20/2019 0251   HGB 9.1 (L) 08/20/2019 0251   HCT 27.2 (L) 08/20/2019 0251   PLT 83 (L) 08/20/2019 0251   MCV 82.9 08/20/2019 0251   MCH 27.7 08/20/2019 0251   MCHC 33.5 08/20/2019 0251   RDW  16.4 (H) 08/20/2019 0251   LYMPHSABS 1.1 08/20/2019 0251   MONOABS 0.4 08/20/2019 0251   EOSABS 0.1 08/20/2019 0251   BASOSABS 0.0 08/20/2019 0251  CMP     Component Value Date/Time   NA 132 (L) 09/03/2019 1608   K 3.8 09/03/2019 1608   CL 102 09/03/2019 1608   CO2 20 09/03/2019 1608   GLUCOSE 159 (H) 09/03/2019 1608   BUN 24 (H) 09/03/2019 1608   CREATININE 1.08 09/03/2019 1608   CREATININE 0.72 07/22/2019 1130   CALCIUM 9.0 09/03/2019 1608   PROT 5.1 (L) 08/20/2019 0251   ALBUMIN 2.3 (L) 08/20/2019 0251   AST 38 08/20/2019 0251   ALT 22 08/20/2019 0251   ALKPHOS 60 08/20/2019 0251   BILITOT 0.8 08/20/2019 0251   GFRNONAA >60 08/20/2019 0251   GFRNONAA 94 07/22/2019 1130   GFRAA >60 08/20/2019 0251   GFRAA 108 07/22/2019 1130    Assessment: 1.  NASH decompensated liver cirrhosis with portal hypertension: Increasing ascites, now only on Lasix 20 mg daily due to hypokalemia 2.  Esophageal varices: Last banded 08/20/2019 with recommendation for repeat banding in 4 weeks, patient set up for repeat EGD 5/20  Plan: 1.  Continue Lasix 20 mg daily for now. 2.  Recheck CMP. 3.  Scheduled patient for therapeutic paracentesis up to 10 L with 25 g albumin for every liter over five drawn off. 4.  Patient will follow with Dr. Havery Moros for EGD on 09/19/2019. 5.  May need to discuss TIPS procedure or other options for the patient given refractory ascites at this point. 6.  Patient to follow in clinic per recommendations from Dr. Havery Moros after time of procedure.  Meagan Newer, PA-C [facility] 09/06/2019, 2:19 PM  Cc: [name], DO

## 2019-09-09 ENCOUNTER — Telehealth: Payer: Self-pay

## 2019-09-09 ENCOUNTER — Other Ambulatory Visit: Payer: Self-pay

## 2019-09-09 ENCOUNTER — Telehealth: Payer: Self-pay | Admitting: Gastroenterology

## 2019-09-09 ENCOUNTER — Ambulatory Visit (HOSPITAL_COMMUNITY)
Admission: RE | Admit: 2019-09-09 | Discharge: 2019-09-09 | Disposition: A | Discharge status: Home / Self Care | Payer: No Typology Code available for payment source | Source: Ambulatory Visit | Attending: Physician Assistant | Admitting: Physician Assistant

## 2019-09-09 DIAGNOSIS — K7460 Unspecified cirrhosis of liver: Secondary | ICD-10-CM

## 2019-09-09 DIAGNOSIS — R188 Other ascites: Secondary | ICD-10-CM | POA: Insufficient documentation

## 2019-09-09 DIAGNOSIS — K7581 Nonalcoholic steatohepatitis (NASH): Secondary | ICD-10-CM | POA: Insufficient documentation

## 2019-09-09 HISTORY — PX: IR PARACENTESIS: IMG2679

## 2019-09-09 MED ORDER — LIDOCAINE HCL (PF) 1 % IJ SOLN
INTRAMUSCULAR | Status: AC | PRN
  Administered 2019-09-09: 10 mL

## 2019-09-09 MED ORDER — ALBUMIN HUMAN 25 % IV SOLN
INTRAVENOUS | Status: AC
  Filled 2019-09-09: qty 200

## 2019-09-09 MED ORDER — ALBUMIN HUMAN 25 % IV SOLN
50.0000 g | Freq: Once | INTRAVENOUS | Status: AC
  Administered 2019-09-09: 11:00:00 50 g via INTRAVENOUS

## 2019-09-09 MED ORDER — LIDOCAINE HCL 1 % IJ SOLN
INTRAMUSCULAR | Status: AC
  Filled 2019-09-09: qty 20

## 2019-09-09 NOTE — Telephone Encounter (Signed)
No answer lab order entered I have sent a message via My Chart (confirmed pt reads messages)

## 2019-09-09 NOTE — Telephone Encounter (Signed)
-----   Message from Levin Erp, [place] sent at 09/09/2019  9:30 AM EDT ----- Regarding: Diuretics Please see Dr. Ozella Rocks addendum to my not and relay recommendations to patient. Can you order at [facility] per him as well in a week from now- you can put it under his name. Thanks-JLL ----- Message ----- From: Yetta Flock, MD Sent: 09/06/2019   8:02 PM EDT To: Levin Erp, PA    ----- Message ----- From: Levin Erp, [place] Sent: 09/06/2019   2:56 PM EDT To: Yetta Flock, MD

## 2019-09-09 NOTE — Procedures (Signed)
Ultrasound-guided  therapeutic paracentesis performed yielding 9.6 liters of straw colored fluid. No immediate complications. EBL is none.

## 2019-09-10 ENCOUNTER — Other Ambulatory Visit: Payer: Self-pay

## 2019-09-10 DIAGNOSIS — K7581 Nonalcoholic steatohepatitis (NASH): Secondary | ICD-10-CM

## 2019-09-10 DIAGNOSIS — K7460 Unspecified cirrhosis of liver: Secondary | ICD-10-CM

## 2019-09-10 NOTE — Telephone Encounter (Signed)
The order was changed to [facility] per Dr Havery Moros

## 2019-09-10 NOTE — Telephone Encounter (Signed)
The pt has been advised that no changes are needed at this time.  She was advised again to have labs on Friday. The pt has been advised of the information and verbalized understanding.

## 2019-09-16 ENCOUNTER — Other Ambulatory Visit (INDEPENDENT_AMBULATORY_CARE_PROVIDER_SITE_OTHER): Payer: No Typology Code available for payment source

## 2019-09-16 ENCOUNTER — Other Ambulatory Visit (HOSPITAL_COMMUNITY): Admission: RE | Admit: 2019-09-16 | Payer: No Typology Code available for payment source | Source: Ambulatory Visit

## 2019-09-16 DIAGNOSIS — K7460 Unspecified cirrhosis of liver: Secondary | ICD-10-CM | POA: Diagnosis not present

## 2019-09-16 DIAGNOSIS — K7581 Nonalcoholic steatohepatitis (NASH): Secondary | ICD-10-CM | POA: Diagnosis not present

## 2019-09-16 LAB — COMPREHENSIVE METABOLIC PANEL
ALT: 28 U/L (ref 0–35)
AST: 50 U/L — ABNORMAL HIGH (ref 0–37)
Albumin: 3.3 g/dL — ABNORMAL LOW (ref 3.5–5.2)
Alkaline Phosphatase: 83 U/L (ref 39–117)
BUN: 36 mg/dL — ABNORMAL HIGH (ref 6–23)
CO2: 17 mEq/L — ABNORMAL LOW (ref 19–32)
Calcium: 9.3 mg/dL (ref 8.4–10.5)
Chloride: 98 mEq/L (ref 96–112)
Creatinine, Ser: 1.87 mg/dL — ABNORMAL HIGH (ref 0.40–1.20)
GFR: 27.78 mL/min — ABNORMAL LOW (ref >60.00)
Glucose, Bld: 137 mg/dL — ABNORMAL HIGH (ref 70–99)
Potassium: 3.8 mEq/L (ref 3.5–5.1)
Sodium: 126 mEq/L — ABNORMAL LOW (ref 135–145)
Total Bilirubin: 1.2 mg/dL (ref 0.2–1.2)
Total Protein: 6.3 g/dL (ref 6.0–8.3)

## 2019-09-17 ENCOUNTER — Other Ambulatory Visit (HOSPITAL_COMMUNITY)
Admission: RE | Admit: 2019-09-17 | Discharge: 2019-09-17 | Disposition: A | Discharge status: Home / Self Care | Payer: No Typology Code available for payment source | Source: Ambulatory Visit | Attending: Gastroenterology | Admitting: Gastroenterology

## 2019-09-17 DIAGNOSIS — Z20822 Contact with and (suspected) exposure to covid-19: Secondary | ICD-10-CM | POA: Diagnosis not present

## 2019-09-17 DIAGNOSIS — Z01812 Encounter for preprocedural laboratory examination: Secondary | ICD-10-CM | POA: Diagnosis not present

## 2019-09-17 LAB — SARS CORONAVIRUS 2 (TAT 6-24 HRS): SARS Coronavirus 2: NEGATIVE

## 2019-09-19 ENCOUNTER — Other Ambulatory Visit: Payer: Self-pay

## 2019-09-19 ENCOUNTER — Ambulatory Visit (HOSPITAL_COMMUNITY)
Admission: RE | Admit: 2019-09-19 | Discharge: 2019-09-19 | Disposition: A | Discharge status: Home / Self Care | Payer: No Typology Code available for payment source | Attending: Gastroenterology | Admitting: Gastroenterology

## 2019-09-19 ENCOUNTER — Encounter (HOSPITAL_COMMUNITY)
Admission: RE | Disposition: A | Discharge status: Home / Self Care | Payer: Self-pay | Source: Home / Self Care | Attending: Gastroenterology

## 2019-09-19 ENCOUNTER — Encounter (HOSPITAL_COMMUNITY): Payer: Self-pay | Admitting: Gastroenterology

## 2019-09-19 ENCOUNTER — Ambulatory Visit (HOSPITAL_COMMUNITY): Payer: No Typology Code available for payment source | Admitting: Anesthesiology

## 2019-09-19 ENCOUNTER — Telehealth: Payer: Self-pay | Admitting: Gastroenterology

## 2019-09-19 DIAGNOSIS — K766 Portal hypertension: Secondary | ICD-10-CM | POA: Diagnosis not present

## 2019-09-19 DIAGNOSIS — I8510 Secondary esophageal varices without bleeding: Secondary | ICD-10-CM | POA: Diagnosis not present

## 2019-09-19 DIAGNOSIS — D509 Iron deficiency anemia, unspecified: Secondary | ICD-10-CM | POA: Insufficient documentation

## 2019-09-19 DIAGNOSIS — Z794 Long term (current) use of insulin: Secondary | ICD-10-CM | POA: Insufficient documentation

## 2019-09-19 DIAGNOSIS — Z8249 Family history of ischemic heart disease and other diseases of the circulatory system: Secondary | ICD-10-CM | POA: Insufficient documentation

## 2019-09-19 DIAGNOSIS — K429 Umbilical hernia without obstruction or gangrene: Secondary | ICD-10-CM | POA: Diagnosis not present

## 2019-09-19 DIAGNOSIS — E1136 Type 2 diabetes mellitus with diabetic cataract: Secondary | ICD-10-CM | POA: Diagnosis not present

## 2019-09-19 DIAGNOSIS — H269 Unspecified cataract: Secondary | ICD-10-CM | POA: Diagnosis not present

## 2019-09-19 DIAGNOSIS — Z7982 Long term (current) use of aspirin: Secondary | ICD-10-CM | POA: Diagnosis not present

## 2019-09-19 DIAGNOSIS — K7581 Nonalcoholic steatohepatitis (NASH): Secondary | ICD-10-CM | POA: Diagnosis not present

## 2019-09-19 DIAGNOSIS — K219 Gastro-esophageal reflux disease without esophagitis: Secondary | ICD-10-CM | POA: Diagnosis not present

## 2019-09-19 DIAGNOSIS — I10 Essential (primary) hypertension: Secondary | ICD-10-CM | POA: Insufficient documentation

## 2019-09-19 DIAGNOSIS — R188 Other ascites: Secondary | ICD-10-CM | POA: Insufficient documentation

## 2019-09-19 DIAGNOSIS — K7460 Unspecified cirrhosis of liver: Secondary | ICD-10-CM

## 2019-09-19 DIAGNOSIS — M1990 Unspecified osteoarthritis, unspecified site: Secondary | ICD-10-CM | POA: Diagnosis not present

## 2019-09-19 DIAGNOSIS — Z79899 Other long term (current) drug therapy: Secondary | ICD-10-CM | POA: Insufficient documentation

## 2019-09-19 DIAGNOSIS — Z833 Family history of diabetes mellitus: Secondary | ICD-10-CM | POA: Diagnosis not present

## 2019-09-19 DIAGNOSIS — I8500 Esophageal varices without bleeding: Secondary | ICD-10-CM

## 2019-09-19 DIAGNOSIS — Z880 Allergy status to penicillin: Secondary | ICD-10-CM | POA: Insufficient documentation

## 2019-09-19 DIAGNOSIS — Z881 Allergy status to other antibiotic agents status: Secondary | ICD-10-CM | POA: Insufficient documentation

## 2019-09-19 DIAGNOSIS — Z888 Allergy status to other drugs, medicaments and biological substances status: Secondary | ICD-10-CM | POA: Diagnosis not present

## 2019-09-19 HISTORY — PX: ESOPHAGEAL BANDING: SHX5518

## 2019-09-19 HISTORY — PX: ESOPHAGOGASTRODUODENOSCOPY (EGD) WITH PROPOFOL: SHX5813

## 2019-09-19 LAB — POCT I-STAT, CHEM 8
BUN: 27 mg/dL — ABNORMAL HIGH (ref 6–20)
Calcium, Ion: 1.31 mmol/L (ref 1.15–1.40)
Chloride: 102 mmol/L (ref 98–111)
Creatinine, Ser: 1.3 mg/dL — ABNORMAL HIGH (ref 0.44–1.00)
Glucose, Bld: 105 mg/dL — ABNORMAL HIGH (ref 70–99)
HCT: 33 % — ABNORMAL LOW (ref 36.0–46.0)
Hemoglobin: 11.2 g/dL — ABNORMAL LOW (ref 12.0–15.0)
Potassium: 3.9 mmol/L (ref 3.5–5.1)
Sodium: 132 mmol/L — ABNORMAL LOW (ref 135–145)
TCO2: 17 mmol/L — ABNORMAL LOW (ref 22–32)

## 2019-09-19 SURGERY — ESOPHAGOGASTRODUODENOSCOPY (EGD) WITH PROPOFOL
Anesthesia: Monitor Anesthesia Care

## 2019-09-19 MED ORDER — LACTATED RINGERS IV SOLN: INTRAVENOUS | Status: DC

## 2019-09-19 MED ORDER — SODIUM CHLORIDE 0.9 % IV SOLN: INTRAVENOUS | Status: DC

## 2019-09-19 MED ORDER — PROPOFOL 500 MG/50ML IV EMUL
INTRAVENOUS | Status: DC | PRN
  Administered 2019-09-19: 150 ug/kg/min via INTRAVENOUS

## 2019-09-19 SURGICAL SUPPLY — 14 items

## 2019-09-19 NOTE — Op Note (Signed)
[facility] Patient Name: Meagan Peck Procedure Date: 09/19/2019 MRN: [id number] Attending MD: Carlota Raspberry. Havery Moros , MD Date of Birth: 1960-10-19 CSN: [id number] Age: 59 Admit Type: Outpatient Procedure:                Upper GI endoscopy Indications:              For therapy of esophageal varices - large varices                            also with large ascites, can't use beta blockade                            due to ascites. Last banded 4/20 multiple bands                            place Providers:                Remo Lipps P. Havery Moros, MD, Cleda Daub, RN,                            William Dalton, Technician Referring MD:              Medicines:                Monitored Anesthesia Care Complications:            No immediate complications. Estimated blood loss:                            Minimal. Estimated Blood Loss:     Estimated blood loss was minimal. Procedure:                Pre-Anesthesia Assessment:                           - Prior to the procedure, a History and Physical                            was performed, and patient medications and                            allergies were reviewed. The patient's tolerance of                            previous anesthesia was also reviewed. The risks                            and benefits of the procedure and the sedation                            options and risks were discussed with the patient.                            All questions were answered, and informed consent  was obtained. Prior Anticoagulants: The patient has                            taken no previous anticoagulant or antiplatelet                            agents. ASA Grade Assessment: III - A patient with                            severe systemic disease. After reviewing the risks                            and benefits, the patient was deemed in                            satisfactory condition to undergo  the procedure.                           After obtaining informed consent, the endoscope was                            passed under direct vision. Throughout the                            procedure, the patient's blood pressure, pulse, and                            oxygen saturations were monitored continuously. The                            GIF-H190 ([id number]) Olympus gastroscope was                            introduced through the mouth, and advanced to the                            second part of duodenum. The upper GI endoscopy was                            accomplished without difficulty. The patient                            tolerated the procedure well. Scope In: Scope Out: Findings:      Esophagogastric landmarks were identified: the Z-line was found at 35       cm, the gastroesophageal junction was found at 35 cm and the upper       extent of the gastric folds was found at 35 cm from the incisors.      Multiple columns of medium to large varices with some red markings were       found in the middle third of the esophagus and in the lower third of the       esophagus (25cm to 35cm). They were medium in size proximally and large       at the GEJ. Five  bands were successfully placed, starting at the GEJ (2)       and working up proximally, resulting in deflation of varices.      The exam of the esophagus was otherwise normal.      The entire examined stomach was normal. No gastric varices      A single small benign polyp was found in the duodenal bulb, previously       biopsied on the last exam.      The exam of the duodenum was otherwise normal. Impression:               - Esophagogastric landmarks identified.                           - Esophageal varices noted as outlined above - 5                            bands placed resulting in deflation.                           - Normal stomach.                           - Benign small polyp of the duodenum previously                             biopsied, not biopsied today.                           - Normal duodenum otherwise Moderate Sedation:      No moderate sedation, case performed with MAC Recommendation:           - Patient has a contact number available for                            emergencies. The signs and symptoms of potential                            delayed complications were discussed with the                            patient. Return to normal activities tomorrow.                            Written discharge instructions were provided to the                            patient.                           - Liquid diet today and then advance slowly as                            tolerated                           - Continue present medications.                           -  Repeat upper endoscopy in 1 month for retreatment.                           - Will plan large volume paracentesis to manage                            ascites, as patient has not been tolerating                            diuretics (renal insufficiency / hyponatremia) Procedure Code(s):        --- Professional ---                           [phone], Esophagogastroduodenoscopy, flexible,                            transoral; with band ligation of esophageal/gastric                            varices Diagnosis Code(s):        --- Professional ---                           I85.00, Esophageal varices without bleeding                           K31.89, Other diseases of stomach and duodenum CPT copyright 2019 American Medical Association. All rights reserved. The codes documented in this report are preliminary and upon coder review may  be revised to meet current compliance requirements. Remo Lipps P. Havery Moros, MD 09/19/2019 3:03:22 PM This report has been signed electronically. Number of Addenda: 0

## 2019-09-19 NOTE — Telephone Encounter (Signed)
EGD done today with banding of varices.  Patient had labs done( [facility]) which showed Na of 132, BUN 27, CR 1.3, after stopping lasix. She states she was only taking lasix 43m / day and her kidneys Na level are not tolerating it. Will need to manage ascites with large volume paracentesis at this time.   SBarbera Setterscan you order large volume paracentesis, give albumin if > 5 L removed.  [name], she will need repeat EGD in 1 month if possible for additional banding if you can place on hospital list. Thanks

## 2019-09-19 NOTE — Interval H&P Note (Signed)
History and Physical Interval Note:  09/19/2019 1:04 PM  Meagan Peck  has presented today for surgery, with the diagnosis of cirrhosis with esophageal varices.  The various methods of treatment have been discussed with the patient and family. After consideration of risks, benefits and other options for treatment, the patient has consented to  Procedure(s): ESOPHAGOGASTRODUODENOSCOPY (EGD) WITH PROPOFOL (N/A) ESOPHAGEAL BANDING (N/A) as a surgical intervention.  The patient's history has been reviewed, patient examined, no change in status, stable for surgery.  I have reviewed the patient's chart and labs.  Questions were answered to the patient's satisfaction.     [place]

## 2019-09-19 NOTE — Interval H&P Note (Signed)
History and Physical Interval Note: Patient here for EGD with banding of varices given she is intolerant of beta blocker due to severe ascites. We are checking BM[facility] pre-op due to recent lab abnormalities on lasix. I have discussed risks / benefits of EGD with banding of varices to include bleeding and esophageal injury, she wishes to proceed, all questions answered.  She denies any cardiolpulmonary symptoms at this time, exam stable / normal other than ascites.     09/19/2019 1:04 PM  Meagan Peck  has presented today for surgery, with the diagnosis of cirrhosis with esophageal varices.  The various methods of treatment have been discussed with the patient and family. After consideration of risks, benefits and other options for treatment, the patient has consented to  Procedure(s): ESOPHAGOGASTRODUODENOSCOPY (EGD) WITH PROPOFOL (N/A) ESOPHAGEAL BANDING (N/A) as a surgical intervention.  The patient's history has been reviewed, patient examined, no change in status, stable for surgery.  I have reviewed the patient's chart and labs.  Questions were answered to the patient's satisfaction.     [place]

## 2019-09-19 NOTE — Discharge Instructions (Signed)
YOU HAD AN ENDOSCOPIC PROCEDURE TODAY: Refer to the procedure report and other information in the discharge instructions given to you for any specific questions about what was found during the examination. If this information does not answer your questions, please call [place] office at [phone] to clarify.   YOU SHOULD EXPECT: Some feelings of bloating in the abdomen. Passage of more gas than usual. Walking can help get rid of the air that was put into your GI tract during the procedure and reduce the bloating. If you had a lower endoscopy (such as a colonoscopy or flexible sigmoidoscopy) you may notice spotting of blood in your stool or on the toilet paper. Some abdominal soreness may be present for a day or two, also.  DIET: Liquid diet today - then progress as tolerated   ACTIVITY: Your care partner should take you home directly after the procedure. You should plan to take it easy, moving slowly for the rest of the day. You can resume normal activity the day after the procedure however YOU SHOULD NOT DRIVE, use power tools, machinery or perform tasks that involve climbing or major physical exertion for 24 hours (because of the sedation medicines used during the test).   SYMPTOMS TO REPORT IMMEDIATELY: A gastroenterologist can be reached at any hour. Please call [phone]  for any of the following symptoms:   Following upper endoscopy (EGD, EUS, ERCP, esophageal dilation) Vomiting of blood or coffee ground material  New, significant abdominal pain  New, significant chest pain or pain under the shoulder blades  Painful or persistently difficult swallowing  New shortness of breath  Black, tarry-looking or red, bloody stools  FOLLOW UP:  If any biopsies were taken you will be contacted by phone or by letter within the next 1-3 weeks. Call [phone]  if you have not heard about the biopsies in 3 weeks.  Please also call with any specific questions about appointments or follow up  tests.

## 2019-09-19 NOTE — Transfer of Care (Signed)
Immediate Anesthesia Transfer of Care Note  Patient: Meagan Peck  Procedure(s) Performed: ESOPHAGOGASTRODUODENOSCOPY (EGD) WITH PROPOFOL (N/A ) ESOPHAGEAL BANDING (N/A )  Patient Location: PACU  Anesthesia Type:MAC  Level of Consciousness: sedated, patient cooperative and responds to stimulation  Airway & Oxygen Therapy: Patient Spontanous Breathing and Patient connected to face mask oxygen  Post-op Assessment: Report given to RN and Post -op Vital signs reviewed and stable  Post vital signs: Reviewed and stable  Last Vitals:  Vitals Value Taken Time  BP 136/80 09/19/19 1453  Temp    Pulse 96 09/19/19 1454  Resp 22 09/19/19 1454  SpO2 100 % 09/19/19 1454  Vitals shown include unvalidated device data.  Last Pain:  Vitals:   09/19/19 1247  TempSrc: Temporal      Patients Stated Pain Goal: 0 (23/55/73 2202)  Complications: No apparent anesthesia complications

## 2019-09-19 NOTE — Anesthesia Preprocedure Evaluation (Addendum)
Anesthesia Evaluation  Patient identified by MRN, date of birth, ID band Patient awake    Reviewed: Allergy & Precautions, NPO status , [name] Chart, lab work & pertinent test results  History of Anesthesia Complications Negative for: history of anesthetic complications  Airway Mallampati: II  TM Distance: >3 FB Neck ROM: Full    Dental  (+) Dental Advisory Given   Pulmonary neg pulmonary ROS,  09/17/2019 SARS coronavirus NEG   breath sounds clear to auscultation       Cardiovascular hypertension, Pt. on medications  Rhythm:Regular Rate:Normal  07/2019 ECHO: EF 60-65%, mild MR   Neuro/Psych negative neurological ROS     GI/Hepatic GERD  Controlled,(+) Cirrhosis   Esophageal Varices    ,   Endo/Other  diabetes (glu 105), Insulin Dependent, Oral Hypoglycemic Agents  Renal/GU Renal InsufficiencyRenal disease (creat 1.87)     Musculoskeletal  (+) Arthritis ,   Abdominal   Peds  Hematology   Anesthesia Other Findings   Reproductive/Obstetrics                           Anesthesia Physical Anesthesia Plan  ASA: III  Anesthesia Plan: MAC   Post-op Pain Management:    Induction:   PONV Risk Score and Plan: 2 and Ondansetron and Dexamethasone  Airway Management Planned: Natural Airway and Nasal Cannula  Additional Equipment: None  Intra-op Plan:   Post-operative Plan:   Informed Consent: I have reviewed the patients History and Physical, chart, labs and discussed the procedure including the risks, benefits and alternatives for the proposed anesthesia with the patient or authorized representative who has indicated his/her understanding and acceptance.     Dental advisory given  Plan Discussed with: CRNA and Surgeon  Anesthesia Plan Comments:        Anesthesia Quick Evaluation

## 2019-09-19 NOTE — Anesthesia Postprocedure Evaluation (Signed)
Anesthesia Post Note  Patient: Meagan Peck  Procedure(s) Performed: ESOPHAGOGASTRODUODENOSCOPY (EGD) WITH PROPOFOL (N/A ) ESOPHAGEAL BANDING (N/A )     Patient location during evaluation: Endoscopy Anesthesia Type: MAC Level of consciousness: awake and alert, patient cooperative and oriented Pain management: pain level controlled Vital Signs Assessment: post-procedure vital signs reviewed and stable Respiratory status: spontaneous breathing, nonlabored ventilation and respiratory function stable Cardiovascular status: blood pressure returned to baseline and stable Postop Assessment: no apparent nausea or vomiting Anesthetic complications: no    Last Vitals:  Vitals:   09/19/19 1500 09/19/19 1510  BP: 121/76 130/68  Pulse: 95 93  Resp: (!) 23 20  Temp:    SpO2: 100% 100%    Last Pain:  Vitals:   09/19/19 1510  TempSrc:   PainSc: 0-No pain                 Summit Borchardt,E. Annaclaire Walsworth

## 2019-09-20 ENCOUNTER — Other Ambulatory Visit: Payer: Self-pay

## 2019-09-20 ENCOUNTER — Encounter: Payer: Self-pay | Admitting: *Deleted

## 2019-09-20 DIAGNOSIS — R188 Other ascites: Secondary | ICD-10-CM

## 2019-09-20 DIAGNOSIS — K7581 Nonalcoholic steatohepatitis (NASH): Secondary | ICD-10-CM

## 2019-09-20 DIAGNOSIS — K7460 Unspecified cirrhosis of liver: Secondary | ICD-10-CM

## 2019-09-20 NOTE — Addendum Note (Signed)
Addended by: Marlon Pel on: 09/20/2019 10:05 AM   Modules accepted: Orders

## 2019-09-20 NOTE — Telephone Encounter (Signed)
Patient has been scheduled at [facility] on 09/23/19 1:00.  Left message for patient to call back

## 2019-09-23 ENCOUNTER — Ambulatory Visit (HOSPITAL_COMMUNITY)
Admission: RE | Admit: 2019-09-23 | Discharge: 2019-09-23 | Disposition: A | Discharge status: Home / Self Care | Payer: No Typology Code available for payment source | Source: Ambulatory Visit | Attending: Gastroenterology | Admitting: Gastroenterology

## 2019-09-23 ENCOUNTER — Other Ambulatory Visit: Payer: Self-pay

## 2019-09-23 DIAGNOSIS — R188 Other ascites: Secondary | ICD-10-CM | POA: Insufficient documentation

## 2019-09-23 DIAGNOSIS — K7581 Nonalcoholic steatohepatitis (NASH): Secondary | ICD-10-CM | POA: Diagnosis present

## 2019-09-23 DIAGNOSIS — K7460 Unspecified cirrhosis of liver: Secondary | ICD-10-CM | POA: Diagnosis not present

## 2019-09-23 HISTORY — PX: IR PARACENTESIS: IMG2679

## 2019-09-23 MED ORDER — ALBUMIN HUMAN 25 % IV SOLN
INTRAVENOUS | Status: AC
  Administered 2019-09-23: 50 g via INTRAVENOUS
  Filled 2019-09-23: qty 300

## 2019-09-23 MED ORDER — LIDOCAINE HCL 1 % IJ SOLN
INTRAMUSCULAR | Status: AC
  Filled 2019-09-23: qty 20

## 2019-09-23 MED ORDER — ALBUMIN HUMAN 25 % IV SOLN: 75.0000 g | Freq: Once | INTRAVENOUS | Status: AC

## 2019-09-23 MED ORDER — LIDOCAINE HCL 1 % IJ SOLN
INTRAMUSCULAR | Status: DC | PRN
  Administered 2019-09-23: 10 mL

## 2019-09-23 NOTE — Procedures (Signed)
Ultrasound-guided therapeutic paracentesis performed yielding 6.8 liters of straw colored fluid.  No immediate complications. EBL is none.

## 2019-09-23 NOTE — Telephone Encounter (Signed)
Patient called back and confirmed appt for today

## 2019-09-24 ENCOUNTER — Telehealth: Payer: Self-pay

## 2019-09-24 ENCOUNTER — Other Ambulatory Visit: Payer: Self-pay

## 2019-09-24 DIAGNOSIS — K7031 Alcoholic cirrhosis of liver with ascites: Secondary | ICD-10-CM

## 2019-09-24 DIAGNOSIS — R188 Other ascites: Secondary | ICD-10-CM

## 2019-09-24 DIAGNOSIS — K7581 Nonalcoholic steatohepatitis (NASH): Secondary | ICD-10-CM

## 2019-09-24 DIAGNOSIS — I8500 Esophageal varices without bleeding: Secondary | ICD-10-CM

## 2019-09-24 DIAGNOSIS — K7460 Unspecified cirrhosis of liver: Secondary | ICD-10-CM

## 2019-09-24 DIAGNOSIS — E871 Hypo-osmolality and hyponatremia: Secondary | ICD-10-CM

## 2019-09-24 NOTE — Telephone Encounter (Signed)
Orders for paracentesis and BMET in 2 weeks entered. Scheduled IR Paracentesis for Tuesday, June 8th at 2:00pm at [facility]. 1:45 arrival.  Patient would like have lab done at [facility] same day.  Lab order faxed to [facility] at [phone].  Scheduled pt for an OV with [facility] on 10-16-19. Discussed having her repeat EGD with banding of varices on July 13th at [facility]. She understands she would have to be Covid tested on Friday, 7-9.  If Dr. Havery Moros can do her case that day we will instruct her when she comes to her OV on 6-16.

## 2019-09-24 NOTE — Telephone Encounter (Signed)
I misread provider's note: Dr Havery Moros wanted labs NEXT week not the following with the paracentesis.  New order placed for labs next week at our lab in the basement.  LM for pt with correction.

## 2019-09-24 NOTE — Telephone Encounter (Signed)
-----   Message from Yetta Flock, MD sent at 09/23/2019  7:38 PM EDT ----- Mary Sella this patient had a paracentesis today. She has not tolerated diuretics - low dose lasix 20mg  / day has led to AKI and hyponatremia and we have stopped it. She need a follow up clinic appointment with me scheduled (even if it is a month out) and if nothing open with me soon then with APPs in a few weeks to discuss long term management. May need to consider TIPS. She needs a repeat BMET next week to ensure stable renal function and Na. Also I would schedule for another paracentesis in 2 weeks in case she needs it, give albumin if > 5 L removed. Can you let her know? Thanks

## 2019-09-24 NOTE — Telephone Encounter (Signed)
Orders entered for par

## 2019-09-24 NOTE — Addendum Note (Signed)
Addended by: Roetta Sessions on: 09/24/2019 10:59 AM   Modules accepted: Orders

## 2019-09-24 NOTE — Telephone Encounter (Signed)
Called and scheduled patient for EGD with banding of varices at [facility] on 7-13 at 12:00pm:  Case [id number]. Scheduled for Covid test on Friday 7-9 at 2:50pm. Patient will be instructed at 6-16 OV with Dr. Havery Moros

## 2019-09-24 NOTE — Addendum Note (Signed)
Addended by: Roetta Sessions on: 09/24/2019 11:06 AM   Modules accepted: Orders

## 2019-09-25 ENCOUNTER — Ambulatory Visit: Payer: No Typology Code available for payment source | Admitting: Gastroenterology

## 2019-10-03 ENCOUNTER — Other Ambulatory Visit (INDEPENDENT_AMBULATORY_CARE_PROVIDER_SITE_OTHER): Payer: No Typology Code available for payment source

## 2019-10-03 DIAGNOSIS — R188 Other ascites: Secondary | ICD-10-CM

## 2019-10-03 DIAGNOSIS — K7460 Unspecified cirrhosis of liver: Secondary | ICD-10-CM | POA: Diagnosis not present

## 2019-10-03 DIAGNOSIS — E871 Hypo-osmolality and hyponatremia: Secondary | ICD-10-CM | POA: Diagnosis not present

## 2019-10-03 LAB — BASIC METABOLIC PANEL
BUN: 30 mg/dL — ABNORMAL HIGH (ref 6–23)
CO2: 18 mEq/L — ABNORMAL LOW (ref 19–32)
Calcium: 9 mg/dL (ref 8.4–10.5)
Chloride: 101 mEq/L (ref 96–112)
Creatinine, Ser: 1.21 mg/dL — ABNORMAL HIGH (ref 0.40–1.20)
GFR: 45.89 mL/min — ABNORMAL LOW (ref >60.00)
Glucose, Bld: 141 mg/dL — ABNORMAL HIGH (ref 70–99)
Potassium: 4.7 mEq/L (ref 3.5–5.1)
Sodium: 126 mEq/L — ABNORMAL LOW (ref 135–145)

## 2019-10-04 ENCOUNTER — Other Ambulatory Visit: Payer: Self-pay

## 2019-10-04 DIAGNOSIS — R188 Other ascites: Secondary | ICD-10-CM

## 2019-10-07 ENCOUNTER — Other Ambulatory Visit (INDEPENDENT_AMBULATORY_CARE_PROVIDER_SITE_OTHER): Payer: No Typology Code available for payment source

## 2019-10-07 DIAGNOSIS — R188 Other ascites: Secondary | ICD-10-CM | POA: Diagnosis not present

## 2019-10-07 LAB — BASIC METABOLIC PANEL
BUN: 34 mg/dL — ABNORMAL HIGH (ref 6–23)
CO2: 16 mEq/L — ABNORMAL LOW (ref 19–32)
Calcium: 9.2 mg/dL (ref 8.4–10.5)
Chloride: 105 mEq/L (ref 96–112)
Creatinine, Ser: 1.21 mg/dL — ABNORMAL HIGH (ref 0.40–1.20)
GFR: 45.89 mL/min — ABNORMAL LOW (ref >60.00)
Glucose, Bld: 211 mg/dL — ABNORMAL HIGH (ref 70–99)
Potassium: 3.9 mEq/L (ref 3.5–5.1)
Sodium: 128 mEq/L — ABNORMAL LOW (ref 135–145)

## 2019-10-08 ENCOUNTER — Other Ambulatory Visit: Payer: Self-pay

## 2019-10-08 ENCOUNTER — Ambulatory Visit (HOSPITAL_COMMUNITY)
Admission: RE | Admit: 2019-10-08 | Discharge: 2019-10-08 | Disposition: A | Discharge status: Home / Self Care | Payer: No Typology Code available for payment source | Source: Ambulatory Visit | Attending: Gastroenterology | Admitting: Gastroenterology

## 2019-10-08 DIAGNOSIS — K7031 Alcoholic cirrhosis of liver with ascites: Secondary | ICD-10-CM | POA: Insufficient documentation

## 2019-10-08 DIAGNOSIS — K7460 Unspecified cirrhosis of liver: Secondary | ICD-10-CM

## 2019-10-08 DIAGNOSIS — R188 Other ascites: Secondary | ICD-10-CM

## 2019-10-08 HISTORY — PX: IR PARACENTESIS: IMG2679

## 2019-10-08 LAB — BODY FLUID CELL COUNT WITH DIFFERENTIAL
Eos, Fluid: 0 %
Lymphs, Fluid: 61 %
Monocyte-Macrophage-Serous Fluid: 35 % — ABNORMAL LOW (ref 50–90)
Neutrophil Count, Fluid: 4 % (ref 0–25)
Total Nucleated Cell Count, Fluid: 40 cu mm (ref 0–1000)

## 2019-10-08 MED ORDER — ALBUMIN HUMAN 25 % IV SOLN
INTRAVENOUS | Status: AC
  Filled 2019-10-08: qty 200

## 2019-10-08 MED ORDER — ALBUMIN HUMAN 25 % IV SOLN
50.0000 g | Freq: Once | INTRAVENOUS | Status: AC
  Administered 2019-10-08: 50 g via INTRAVENOUS

## 2019-10-08 MED ORDER — LIDOCAINE HCL 1 % IJ SOLN
INTRAMUSCULAR | Status: AC
  Filled 2019-10-08: qty 20

## 2019-10-08 MED ORDER — LIDOCAINE HCL 1 % IJ SOLN
INTRAMUSCULAR | Status: DC | PRN
  Administered 2019-10-08: 10 mL

## 2019-10-08 NOTE — Procedures (Signed)
PROCEDURE SUMMARY:  Successful image-guided paracentesis from the left lower abdomen.  Yielded 10.1 liters of clear yellow fluid.  No immediate complications.  EBL: zero Patient tolerated well.   Specimen was sent for labs.  Please see imaging section of Epic for full dictation.  Joaquim Nam PA-C 10/08/2019 3:00 PM

## 2019-10-09 ENCOUNTER — Other Ambulatory Visit: Payer: Self-pay

## 2019-10-09 DIAGNOSIS — Z23 Encounter for immunization: Secondary | ICD-10-CM

## 2019-10-09 LAB — PATHOLOGIST SMEAR REVIEW

## 2019-10-16 ENCOUNTER — Other Ambulatory Visit (INDEPENDENT_AMBULATORY_CARE_PROVIDER_SITE_OTHER): Payer: No Typology Code available for payment source

## 2019-10-16 ENCOUNTER — Encounter: Payer: Self-pay | Admitting: Gastroenterology

## 2019-10-16 ENCOUNTER — Ambulatory Visit (INDEPENDENT_AMBULATORY_CARE_PROVIDER_SITE_OTHER): Payer: No Typology Code available for payment source | Admitting: Gastroenterology

## 2019-10-16 VITALS — BP 116/54 | HR 62 | Ht <= 58 in | Wt 137.0 lb

## 2019-10-16 DIAGNOSIS — Z23 Encounter for immunization: Secondary | ICD-10-CM | POA: Diagnosis not present

## 2019-10-16 DIAGNOSIS — K7460 Unspecified cirrhosis of liver: Secondary | ICD-10-CM

## 2019-10-16 DIAGNOSIS — R188 Other ascites: Secondary | ICD-10-CM

## 2019-10-16 DIAGNOSIS — I8500 Esophageal varices without bleeding: Secondary | ICD-10-CM | POA: Diagnosis not present

## 2019-10-16 NOTE — Patient Instructions (Addendum)
If you are age 57 or older, your body mass index should be between 23-30. Your Body mass index is 28.88 kg/m. If this is out of the aforementioned range listed, please consider follow up with your Primary Care Provider.  If you are age 12 or younger, your body mass index should be between 19-25. Your Body mass index is 28.88 kg/m. If this is out of the aformentioned range listed, please consider follow up with your Primary Care Provider.   You have been scheduled for an endoscopy. Please follow written instructions given to you at your visit today. If you use inhalers (even only as needed), please bring them with you on the day of your procedure.  Please go to the lab in the basement of our building to have lab work done as you leave today. Hit "B" for basement when you get on the elevator.  When the doors open the lab is on your left.  We will call you with the results. Thank you.  You have been scheduled for an abdominal paracentesis at [facility] radiology (1st floor of hospital) on Friday, 10-18-19 at 9:00am. Please arrive at least 15 minutes prior to your appointment time for registration. Should you need to reschedule this appointment for any reason, please call our office at [phone].   We are giving you your first of 3 Twinrix injections today to protect you against Hepatitis A and B.  You will be due for your second injections on Friday, 11-15-19 at 2:30pm.  We are giving you information today about how to file FMLA paperwork with [employer].  Call [phone].  We will contact you tomorrow regarding a consult for the TIPS procedure.   Thank you for entrusting me with your care and for choosing [facility], Dr. Concord Cellar

## 2019-10-16 NOTE — Progress Notes (Signed)
HPI :  59 year old female here for a follow-up visit for cirrhosis.  The patient established with our practice in March for new onset cirrhosis.  She is had a serologic work-up that has been largely unremarkable and this has been suspected related to [facility] in the setting of diabetes vs. cryptogenic.  Her course has been complicated by severe ascites and large esophageal varices.  She has not had problems with jaundice or hepatic encephalopathy.  Management of her ascites has been quite difficult.  On higher doses of diuretic she had hyperkalemia from Aldactone that was quite difficult to manage and required hospitalization.  She has been on Lasix monotherapy however has developed problems with hyponatremia and low potassium on 40 mg/day dosing.  On lower dose 20 mg/day her sodium and potassium have been more stable however having recurrent paracentesis with upwards of 10 L removed at a time.  She has not had any evidence of variceal bleeding but has had 2 EGDs with banding in April and in May given the large varices and risk for bleeding, and that she cannot tolerate beta-blockade due to her refractory ascites and that her resting heart rate is around 60.  She does not drink any alcohol and has never drink any significant alcohol in the past.  She states she is compliant with a low-sodium diet.  Since her last paracentesis 1 week ago at which point 10 L was removed, she is regained about 15 pounds of weight.  She has lower extremity edema despite taking Lasix 20 mg a day.  She is due for her next dose of Twinrix.  Discussed her options at length as below.  Husband reports her mental status is sharp and she has never had encephalopathy.  She has never had SBP.  No evidence of jaundice today.  She does have a history of iron deficiency anemia with a EGD and colonoscopy done recently as below.  Suspect anemia could be related to portal hypertensive gastritis.  Endoscopic history: EGD 09/19/19 -  -  Esophageal varices noted as outlined above - 5 bands placed resulting in deflation. - Normal stomach. - Benign small polyp of the duodenum previously biopsied, not biopsied today. - Normal duodenum otherwise  EGD 08/20/19 - Grade II and large (> 5 mm) esophageal varices.  Banded x 5 - Portal hypertensive gastropathy. - Single nodule in duodenal bulb. Biopsied.  Colonoscopy 08/13/19 - A few small, nonbleeding sigmoid, descending, IC valve AVMs, were not treated or ablated. Small internal hemorrhoids.  Echo 07/25/19 - EF 60-65%,   Para 10/08/19 - 10 L removed   Past Medical History:  Diagnosis Date  . Allergy   . Arthritis   . Ascites   . Cataract   . Cirrhosis ([place])   . Diabetes mellitus without complication ([place])   . Esophageal varices ([place])   . GERD (gastroesophageal reflux disease)   . Hypertension   . Iron deficiency anemia 09/26/2018     Past Surgical History:  Procedure Laterality Date  . BIOPSY  08/20/2019   Procedure: BIOPSY;  Surgeon: Jackquline Denmark, MD;  Location: [place];  Service: Gastroenterology;;  . CARPAL TUNNEL RELEASE Right   . CESAREAN SECTION    . CHOLECYSTECTOMY    . ESOPHAGEAL BANDING N/A 09/19/2019   Procedure: ESOPHAGEAL BANDING;  Surgeon: Yetta Flock, MD;  Location: [facility];  Service: Gastroenterology;  Laterality: N/A;  . ESOPHAGOGASTRODUODENOSCOPY (EGD) WITH PROPOFOL N/A 08/20/2019   Procedure: ESOPHAGOGASTRODUODENOSCOPY (EGD) WITH PROPOFOL;  Surgeon: Jackquline Denmark, MD;  Location: [facility];  Service: Gastroenterology;  Laterality: N/A;  . ESOPHAGOGASTRODUODENOSCOPY (EGD) WITH PROPOFOL N/A 09/19/2019   Procedure: ESOPHAGOGASTRODUODENOSCOPY (EGD) WITH PROPOFOL;  Surgeon: Yetta Flock, MD;  Location: [facility];  Service: Gastroenterology;  Laterality: N/A;  . GASTRIC VARICES BANDING N/A 08/20/2019   Procedure: GASTRIC VARICES BANDING;  Surgeon: Jackquline Denmark, MD;  Location: [place];  Service: Gastroenterology;   Laterality: N/A;  . IR PARACENTESIS  08/01/2019  . IR PARACENTESIS  08/19/2019  . IR PARACENTESIS  09/09/2019  . IR PARACENTESIS  09/23/2019  . IR PARACENTESIS  10/08/2019  . KNEE ARTHROSCOPY Right 12/10/2015   Procedure: RIGHT KNEE ARTHROSCOPY WITH PARTIAL MEDIAL MENISCECTOMY;  Surgeon: Mcarthur Rossetti, MD;  Location: [facility];  Service: Orthopedics;  Laterality: Right;  . KNEE ARTHROSCOPY W/ MENISCAL REPAIR Bilateral   . MENISCUS REPAIR Left    Family History  Problem Relation Age of Onset  . Hypertension Mother   . COPD Mother   . Alcoholism Father   . Heart attack Father   . Diabetes Brother   . Diabetes Paternal Grandmother   . Heart disease Paternal Grandmother   . Alcoholism Paternal Uncle   . Alcoholism Maternal Uncle   . Heart disease Maternal Grandmother   . Heart disease Maternal Grandfather   . Heart disease Paternal Grandfather    Social History   Tobacco Use  . Smoking status: Never Smoker  . Smokeless tobacco: Never Used  Vaping Use  . Vaping Use: Never used  Substance Use Topics  . Alcohol use: No  . Drug use: No   Current Outpatient Medications  Medication Sig Dispense Refill  . ACCU-CHEK FASTCLIX LANCETS MISC Check fsbs TID    . aspirin 81 MG tablet Take 1 tablet (81 mg total) by mouth daily. 30 tablet   . blood glucose meter kit and supplies KIT Dispense based on patient and insurance preference. Use up to four times daily as directed. Please include lancets, test strips, control solution. 1 each 99  . cetirizine (ZYRTEC) 10 MG tablet Take 1 tablet (10 mg total) by mouth daily. 90 tablet 3  . Cholecalciferol (VITAMIN D3) 5000 UNITS TABS Take 5,000 Units by mouth daily.     Marland Kitchen escitalopram (LEXAPRO) 10 MG tablet TAKE 1 TABLET (10 MG TOTAL) BY MOUTH EVERY EVENING. (Patient taking differently: Take 10 mg by mouth at bedtime. ) 90 tablet 1  . ferrous sulfate 325 (65 FE) MG EC tablet Take 1 tablet (325 mg total) by mouth 2 (two) times daily with a meal. 180  tablet 1  . glucose blood (ACCU-CHEK GUIDE) test strip Use to check blood sugar 3 times daily. Dx:E08.00 100 each 12  . Insulin Pen Needle (ULTICARE SHORT PEN NEEDLES) 31G X 8 MM MISC USE AS DIRECTED TO INJECT LANTUS AND BYETTA 100 each 12  . Krill Oil 500 MG CAPS Take 500 mg by mouth daily.    . magnesium oxide (MAG-OX) 400 MG tablet Take 1 tablet (400 mg total) by mouth 2 (two) times daily. 180 tablet 3  . metFORMIN (GLUCOPHAGE) 1000 MG tablet TAKE 1 TABLET (1,000 MG TOTAL) BY MOUTH 2 (TWO) TIMES DAILY WITH A MEAL. 90 tablet 1   No current facility-administered medications for this visit.   Allergies  Allergen Reactions  . Penicillins Anaphylaxis    Happened as a young child  . Atrovent [Ipratropium] Palpitations    As per patient  . Naproxen Rash  . Quinine Derivatives Rash  . Sulfa Antibiotics Rash  Review of Systems: All systems reviewed and negative except where noted in HPI.    IR Paracentesis  Result Date: 10/08/2019 INDICATION: Patient with history of cirrhosis, recurrent ascites. Request to IR for diagnostic and therapeutic paracentesis. EXAM: ULTRASOUND GUIDED DIAGNOSTIC AND THERAPEUTIC PARACENTESIS MEDICATIONS: 10 mL 1% lidocaine COMPLICATIONS: None immediate. PROCEDURE: Informed written consent was obtained from the patient after a discussion of the risks, benefits and alternatives to treatment. A timeout was performed prior to the initiation of the procedure. Initial ultrasound scanning demonstrates a large amount of ascites within the left lower abdominal quadrant. The left lower abdomen was prepped and draped in the usual sterile fashion. 1% lidocaine was used for local anesthesia. Following this, a 19 gauge, 10-cm, Yueh catheter was introduced. An ultrasound image was saved for documentation purposes. The paracentesis was performed. The catheter was removed and a dressing was applied. The patient tolerated the procedure well without immediate post procedural  complication. Patient received post-procedure intravenous albumin; see nursing notes for details. FINDINGS: A total of approximately 10.1 L of clear yellow fluid was removed. Samples were sent to the laboratory as requested by the clinical team. IMPRESSION: Successful ultrasound-guided paracentesis yielding 10.1 liters of peritoneal fluid. Read by Candiss Norse, PA-C Electronically Signed   By: Aletta Edouard M.D.   On: 10/08/2019 16:20   IR Paracentesis  Result Date: 09/23/2019 INDICATION: Patient history of cirrhosis with recurrent ascites presents for therapeutic paracentesis with a maximum of 10 L EXAM: ULTRASOUND GUIDED THERAPEUTIC PARACENTESIS MEDICATIONS: Lidocaine 1% 10 mL COMPLICATIONS: None immediate. PROCEDURE: Informed written consent was obtained from the patient after a discussion of the risks, benefits and alternatives to treatment. A timeout was performed prior to the initiation of the procedure. Initial ultrasound scanning demonstrates a large amount of ascites within the right lower abdominal quadrant. The right lower abdomen was prepped and draped in the usual sterile fashion. 1% lidocaine was used for local anesthesia. Following this, a 6 Fr Safe-T-Centesis catheter was introduced. An ultrasound image was saved for documentation purposes. The paracentesis was performed. The catheter was removed and a dressing was applied. The patient tolerated the procedure well without immediate post procedural complication. Patient received post-procedure intravenous albumin; see nursing notes for details. FINDINGS: A total of approximately 6.9 L of straw-colored fluid was removed. IMPRESSION: Successful ultrasound-guided therapeutic paracentesis yielding 6.9 liters of peritoneal fluid. Read by: Rushie Nyhan, NP Electronically Signed   By: Corrie Mckusick D.O.   On: 09/23/2019 14:33   Lab Results  Component Value Date   WBC 3.2 (L) 08/20/2019   HGB 11.2 (L) 09/19/2019   HCT 33.0 (L)  09/19/2019   MCV 82.9 08/20/2019   PLT 83 (L) 08/20/2019    Lab Results  Component Value Date   CREATININE 1.21 (H) 10/07/2019   BUN 34 (H) 10/07/2019   NA 128 (L) 10/07/2019   K 3.9 10/07/2019   CL 105 10/07/2019   CO2 16 (L) 10/07/2019    Lab Results  Component Value Date   ALT 28 09/16/2019   AST 50 (H) 09/16/2019   ALKPHOS 83 09/16/2019   BILITOT 1.2 09/16/2019    Lab Results  Component Value Date   INR 1.3 (H) 08/16/2019   INR 1.4 (H) 07/31/2019      Physical Exam: BP (!) 116/54   Pulse 62   Ht 4' 9.75" (1.467 m)   Wt 137 lb (62.1 kg)   LMP 12/03/2013   BMI 28.88 kg/m  Constitutional: Pleasant, female in no acute  distress. HEENT: Normocephalic and atraumatic. No scleral icterus. Cardiovascular: Normal rate, regular rhythm.  Pulmonary/chest: Effort normal and breath sounds normal.decreased BS in right base Abdominal: Soft, distended with ascites, nontender.There are no masses palpable. . Extremities: (+) 1 LE edema Lymphadenopathy: No cervical adenopathy noted. Neurological: Alert and oriented to person place and time. No asterixis Skin: Skin is warm and dry. No rashes noted. Psychiatric: Normal mood and affect. Behavior is normal.   ASSESSMENT AND PLAN: 59 year old female here for reassessment of the following:  Decompensated cirrhosis with ascites / esophageal varices - patient with a relatively recent diagnosis of cirrhosis in March that has progressed quickly with refractory ascites and large esophageal varices.  The ascites has been quite difficult to manage, as above she was admitted with hyperkalemia from Aldactone, and Lasix has led to hypokalemia and hyponatremia at times.  She only tolerates low-dose Lasix but this clearly not enough to keep her ascites at [place].  She has required frequent large-volume paracentesis despite compliance with a low-sodium diet.  Further, she is not a candidate for nadolol or propranolol given her refractory ascites.  This  has been managed with esophageal banding x2, last done in May.  Her echo was normal.  At this point in time given difficulty managing her ascites as outlined along with large esophageal varices, I think TIPS evaluation with IR is appropriate.  She has no hepatic encephalopathy and her bilirubin is normal, she appears to be a good candidate for this.  I discussed with her what TIPS is, risks and benefits, she wishes to proceed with evaluation for this per IR.  In the interim we will refer her back for another large-volume paracentesis, give albumin if greater than 5 L.  She is due for follow-up we met today to make sure renal function and electrolytes are stable, we will also recheck her INR.  She is due for second dose of Twinrix today.  We discussed what decompensated cirrhosis is, risks for [facility] and further decompensation.  We discussed how the only curative therapy for this at this point is liver transplant if she continues to deteriorate and will likely set up with hepatology in the next month or 2 in case she is in need of transplant at some point in the future.  Patient in agreement with the plan as outlined:  Recommend: - Continue low-sodium diet - Go to the lab today for BMET, INR - Continue Lasix 20 mg a day - Referral to IR for ASAP large-volume paracentesis, give albumin if more than 5 L removed - Referral to IR for TIPS consult - Patient scheduled for EGD at the hospital in July for additional banding.  However if she undergoes TIPS in the interim, will no longer need EGDs with banding - Twinrix vaccine today - Anticipate hepatology referral in the near future pending her course  Magnolia Cellar, MD [facility]

## 2019-10-17 ENCOUNTER — Other Ambulatory Visit: Payer: Self-pay | Admitting: Gastroenterology

## 2019-10-17 ENCOUNTER — Telehealth: Payer: Self-pay

## 2019-10-17 DIAGNOSIS — K7460 Unspecified cirrhosis of liver: Secondary | ICD-10-CM

## 2019-10-17 DIAGNOSIS — R188 Other ascites: Secondary | ICD-10-CM

## 2019-10-17 LAB — BASIC METABOLIC PANEL
BUN: 42 mg/dL — ABNORMAL HIGH (ref 6–23)
CO2: 17 mEq/L — ABNORMAL LOW (ref 19–32)
Calcium: 9.4 mg/dL (ref 8.4–10.5)
Chloride: 96 mEq/L (ref 96–112)
Creatinine, Ser: 1.53 mg/dL — ABNORMAL HIGH (ref 0.40–1.20)
GFR: 35 mL/min — ABNORMAL LOW (ref >60.00)
Glucose, Bld: 115 mg/dL — ABNORMAL HIGH (ref 70–99)
Potassium: 5.5 mEq/L — ABNORMAL HIGH (ref 3.5–5.1)
Sodium: 123 mEq/L — ABNORMAL LOW (ref 135–145)

## 2019-10-17 LAB — PROTIME-INR
INR: 1.3 ratio — ABNORMAL HIGH (ref 0.8–1.0)
Prothrombin Time: 14.5 s — ABNORMAL HIGH (ref 9.6–13.1)

## 2019-10-17 LAB — IGG: IgG (Immunoglobin G), Serum: 1370 mg/dL (ref 600–1640)

## 2019-10-17 NOTE — Telephone Encounter (Signed)
Spoke to [place] at [facility].  She will place the order for STAT TIPS consult and will call the pt to schedule.  They have an opening for next Tuesday for the procedure.

## 2019-10-17 NOTE — Telephone Encounter (Signed)
Thanks [name]

## 2019-10-18 ENCOUNTER — Ambulatory Visit (HOSPITAL_COMMUNITY): Admission: RE | Admit: 2019-10-18 | Payer: No Typology Code available for payment source | Source: Ambulatory Visit

## 2019-10-22 ENCOUNTER — Other Ambulatory Visit: Payer: Self-pay

## 2019-10-22 ENCOUNTER — Emergency Department (INDEPENDENT_AMBULATORY_CARE_PROVIDER_SITE_OTHER)
Admission: EM | Admit: 2019-10-22 | Discharge: 2019-10-22 | Disposition: A | Discharge status: Home / Self Care | Payer: No Typology Code available for payment source | Source: Home / Self Care | Attending: Family Medicine | Admitting: Family Medicine

## 2019-10-22 DIAGNOSIS — L03115 Cellulitis of right lower limb: Secondary | ICD-10-CM

## 2019-10-22 DIAGNOSIS — L03116 Cellulitis of left lower limb: Secondary | ICD-10-CM | POA: Diagnosis not present

## 2019-10-22 MED ORDER — DOXYCYCLINE HYCLATE 100 MG PO CAPS: ORAL_CAPSULE | ORAL | 0 refills | Status: DC

## 2019-10-22 MED ORDER — MUPIROCIN 2 % EX OINT: 1.0000 "application " | TOPICAL_OINTMENT | Freq: Three times a day (TID) | CUTANEOUS | 0 refills | Status: DC

## 2019-10-22 NOTE — Discharge Instructions (Addendum)
Change bandages daily and apply mupirocin ointment to wounds.  Keep bandages clean and dry.

## 2019-10-22 NOTE — ED Triage Notes (Addendum)
Last Wed (6/17) fell after getting out of bed to go to bathroom. Pt states she aches all over. Scraped up both knees, scabbed over and redness noted. Denies LOC. Iced and neosporin on knees after fall.

## 2019-10-22 NOTE — ED Provider Notes (Signed)
[facility]    CSN: [id number] Arrival date & time: 10/22/19  1744      History   Chief Complaint Chief Complaint  Patient presents with  . Fall    6/17  . Knee Pain    Bilateral  . Dizziness    HPI Meagan Peck is a 59 y.o. female.   While climbing out of her bed five days ago, patient tripped and scraped her anterior knees.  The abrasions have become increasingly red and sore    Knee Pain Location:  Knee Time since incident:  5 days Injury: yes   Mechanism of injury: fall   Fall:    Impact surface:  Hard floor   Point of impact: knees. Knee location:  L knee and R knee Pain details:    Quality:  Aching   Radiates to:  Does not radiate   Severity:  Mild   Onset quality:  Sudden   Duration:  3 days   Timing:  Constant   Progression:  Worsening Chronicity:  New Prior injury to area:  No Relieved by:  Nothing Worsened by:  Extension and flexion Ineffective treatments: Neosporin ointment. Associated symptoms: no decreased ROM, no fatigue and no fever     Past Medical History:  Diagnosis Date  . Allergy   . Arthritis   . Ascites   . Cataract   . Cirrhosis ([place])   . Diabetes mellitus without complication ([place])   . Esophageal varices ([place])   . GERD (gastroesophageal reflux disease)   . Hypertension   . Iron deficiency anemia 09/26/2018    Patient Active Problem List   Diagnosis Date Noted  . Esophageal varices without bleeding ([place])   . Hyperkalemia 08/16/2019  . Hypertension associated with diabetes ([place]) 08/16/2019  . Cirrhosis ([place]) 08/16/2019  . Iron deficiency anemia 09/26/2018  . Syncope 11/27/2017  . Chest pain 11/27/2017  . Vitamin D deficiency 05/15/2017  . Iron deficiency 05/15/2017  . Breast screening declined 05/15/2017  . Colonoscopy refused 05/15/2017  . Chronic right shoulder pain 05/15/2017  . Choroidal nevus of both eyes 07/18/2016  . Corneal epithelial and basement membrane dystrophy 07/18/2016  .  Thrombocytopenia ([place]) 04/14/2016  . Acute medial meniscus tear of right knee 12/10/2015  . Benign essential HTN 05/25/2015  . Hyperlipidemia associated with type 2 diabetes mellitus ([place]) 05/25/2015  . Gastroesophageal reflux disease 05/25/2015  . Type 2 diabetes mellitus without complication, with long-term current use of insulin ([place]) 05/25/2015  . Diabetes ([place]) 07/23/2012    Past Surgical History:  Procedure Laterality Date  . BIOPSY  08/20/2019   Procedure: BIOPSY;  Surgeon: Jackquline Denmark, MD;  Location: [facility];  Service: Gastroenterology;;  . CARPAL TUNNEL RELEASE Right   . CESAREAN SECTION    . CHOLECYSTECTOMY    . ESOPHAGEAL BANDING N/A 09/19/2019   Procedure: ESOPHAGEAL BANDING;  Surgeon: Yetta Flock, MD;  Location: [facility];  Service: Gastroenterology;  Laterality: N/A;  . ESOPHAGOGASTRODUODENOSCOPY (EGD) WITH PROPOFOL N/A 08/20/2019   Procedure: ESOPHAGOGASTRODUODENOSCOPY (EGD) WITH PROPOFOL;  Surgeon: Jackquline Denmark, MD;  Location: [facility];  Service: Gastroenterology;  Laterality: N/A;  . ESOPHAGOGASTRODUODENOSCOPY (EGD) WITH PROPOFOL N/A 09/19/2019   Procedure: ESOPHAGOGASTRODUODENOSCOPY (EGD) WITH PROPOFOL;  Surgeon: Yetta Flock, MD;  Location: [facility];  Service: Gastroenterology;  Laterality: N/A;  . GASTRIC VARICES BANDING N/A 08/20/2019   Procedure: GASTRIC VARICES BANDING;  Surgeon: Jackquline Denmark, MD;  Location: [facility];  Service: Gastroenterology;  Laterality: N/A;  . IR PARACENTESIS  08/01/2019  . IR PARACENTESIS  08/19/2019  . IR PARACENTESIS  09/09/2019  . IR PARACENTESIS  09/23/2019  . IR PARACENTESIS  10/08/2019  . KNEE ARTHROSCOPY Right 12/10/2015   Procedure: RIGHT KNEE ARTHROSCOPY WITH PARTIAL MEDIAL MENISCECTOMY;  Surgeon: Mcarthur Rossetti, MD;  Location: [facility];  Service: Orthopedics;  Laterality: Right;  . KNEE ARTHROSCOPY W/ MENISCAL REPAIR Bilateral   . MENISCUS REPAIR Left     OB History   No obstetric history on  file.      Home Medications    Prior to Admission medications   Medication Sig Start Date End Date Taking? Authorizing Provider  ACCU-CHEK FASTCLIX LANCETS MISC Check fsbs TID 08/09/16   [provider]  aspirin 81 MG tablet Take 1 tablet (81 mg total) by mouth daily. 08/30/19   Cherylann Ratel A, DO  blood glucose meter kit and supplies KIT Dispense based on patient and insurance preference. Use up to four times daily as directed. Please include lancets, test strips, control solution. 05/16/19   [name], DO  cetirizine (ZYRTEC) 10 MG tablet Take 1 tablet (10 mg total) by mouth daily. 01/02/19   [name], DO  Cholecalciferol (VITAMIN D3) 5000 UNITS TABS Take 5,000 Units by mouth daily.     [provider]  doxycycline (VIBRAMYCIN) 100 MG capsule Take one cap PO Q12hr with food. 10/22/19   Kandra Nicolas, MD  escitalopram (LEXAPRO) 10 MG tablet TAKE 1 TABLET (10 MG TOTAL) BY MOUTH EVERY EVENING. Patient taking differently: Take 10 mg by mouth at bedtime.  05/31/19   [name], DO  ferrous sulfate 325 (65 FE) MG EC tablet Take 1 tablet (325 mg total) by mouth 2 (two) times daily with a meal. 09/26/18   [name], DO  glucose blood (ACCU-CHEK GUIDE) test strip Use to check blood sugar 3 times daily. Dx:E08.00 02/26/18   [name], DO  Insulin Pen Needle (ULTICARE SHORT PEN NEEDLES) 31G X 8 MM MISC USE AS DIRECTED TO INJECT LANTUS AND BYETTA 10/19/18   [name], DO  Krill Oil 500 MG CAPS Take 500 mg by mouth daily.    [provider]  magnesium oxide (MAG-OX) 400 MG tablet Take 1 tablet (400 mg total) by mouth 2 (two) times daily. 01/02/19   [name], DO  metFORMIN (GLUCOPHAGE) 1000 MG tablet TAKE 1 TABLET (1,000 MG TOTAL) BY MOUTH 2 (TWO) TIMES DAILY WITH A MEAL. 07/03/19   [name], DO  mupirocin ointment (BACTROBAN) 2 % Apply 1 application topically 3 (three) times daily. 10/22/19   Kandra Nicolas, MD     Family History Family History  Problem Relation Age of Onset  . Hypertension Mother   . COPD Mother   . Alcoholism Father   . Heart attack Father   . Diabetes Brother   . Diabetes Paternal Grandmother   . Heart disease Paternal Grandmother   . Alcoholism Paternal Uncle   . Alcoholism Maternal Uncle   . Heart disease Maternal Grandmother   . Heart disease Maternal Grandfather   . Heart disease Paternal Grandfather     Social History Social History   Tobacco Use  . Smoking status: Never Smoker  . Smokeless tobacco: Never Used  Vaping Use  . Vaping Use: Never used  Substance Use Topics  . Alcohol use: No  . Drug use: No     Allergies   Penicillins, Atrovent [ipratropium], Naproxen, Quinine derivatives, and Sulfa antibiotics   Review of Systems Review of  Systems  Constitutional: Negative for chills, diaphoresis, fatigue and fever.  Musculoskeletal: Negative for joint swelling.  Skin: Positive for color change and wound.  All other systems reviewed and are negative.    Physical Exam Triage Vital Signs ED Triage Vitals  Enc Vitals Group     BP 10/22/19 1800 134/83     Pulse Rate 10/22/19 1800 96     Resp 10/22/19 1800 18     Temp 10/22/19 1800 97.8 F (36.6 C)     Temp Source 10/22/19 1800 Oral     SpO2 10/22/19 1800 100 %     Weight --      Height --      Head Circumference --      Peak Flow --      Pain Score 10/22/19 1801 7     Pain Loc --      Pain Edu? --      Excl. in [place]? --    No data found.  Updated Vital Signs BP 134/83 (BP Location: Right Arm)   Pulse 96   Temp 97.8 F (36.6 C) (Oral)   Resp 18   LMP 12/03/2013   SpO2 100%   Visual Acuity Right Eye Distance:   Left Eye Distance:   Bilateral Distance:    Right Eye Near:   Left Eye Near:    Bilateral Near:     Physical Exam Vitals and nursing note reviewed.  Constitutional:      General: She is not in acute distress. HENT:     Head: Atraumatic.  Eyes:     Pupils:  Pupils are equal, round, and reactive to light.  Cardiovascular:     Rate and Rhythm: Normal rate.  Pulmonary:     Effort: Pulmonary effort is normal.  Musculoskeletal:       Legs:     Comments: Knees have full range of motion. Eschar below each knee with surrounding tenderness and erythema.  No induration or fluctuance.  Skin:    General: Skin is warm and dry.  Neurological:     Mental Status: She is alert.      UC Treatments / Results  Labs (all labs ordered are listed, but only abnormal results are displayed) Labs Reviewed - No data to display  EKG   Radiology No results found.  Procedures Procedures (including critical care time)  Medications Ordered in UC Medications - No data to display  Initial Impression / Assessment and Plan / UC Course  I have reviewed the triage vital signs and the nursing notes.  Pertinent labs & imaging results that were available during my care of the patient were reviewed by me and considered in my medical decision making (see chart for details).    Begin doxycycline for staph coverage, and Bactroban ointment. Followup with Family Doctor if not improved in about 6 days.   Final Clinical Impressions(s) / UC Diagnoses   Final diagnoses:  Cellulitis of left knee  Cellulitis of right knee     Discharge Instructions     Change bandages daily and apply mupirocin ointment to wounds.  Keep bandages clean and dry.     ED Prescriptions    Medication Sig Dispense Auth. Provider   doxycycline (VIBRAMYCIN) 100 MG capsule Take one cap PO Q12hr with food. 20 capsule Kandra Nicolas, MD   mupirocin ointment (BACTROBAN) 2 % Apply 1 application topically 3 (three) times daily. 30 g Kandra Nicolas, MD  Kandra Nicolas, MD 10/26/19 [phone]

## 2019-10-23 ENCOUNTER — Ambulatory Visit (HOSPITAL_COMMUNITY)
Admission: RE | Admit: 2019-10-23 | Discharge: 2019-10-23 | Disposition: A | Discharge status: Home / Self Care | Payer: No Typology Code available for payment source | Source: Ambulatory Visit | Attending: Gastroenterology | Admitting: Gastroenterology

## 2019-10-23 ENCOUNTER — Other Ambulatory Visit: Payer: Self-pay

## 2019-10-23 ENCOUNTER — Telehealth: Payer: Self-pay

## 2019-10-23 DIAGNOSIS — K7031 Alcoholic cirrhosis of liver with ascites: Secondary | ICD-10-CM

## 2019-10-23 DIAGNOSIS — R188 Other ascites: Secondary | ICD-10-CM | POA: Diagnosis not present

## 2019-10-23 DIAGNOSIS — K7460 Unspecified cirrhosis of liver: Secondary | ICD-10-CM

## 2019-10-23 HISTORY — PX: IR PARACENTESIS: IMG2679

## 2019-10-23 LAB — BASIC METABOLIC PANEL
Anion gap: 9 (ref 5–15)
BUN: 29 mg/dL — ABNORMAL HIGH (ref 6–20)
CO2: 19 mmol/L — ABNORMAL LOW (ref 22–32)
Calcium: 9.4 mg/dL (ref 8.9–10.3)
Chloride: 102 mmol/L (ref 98–111)
Creatinine, Ser: 1.37 mg/dL — ABNORMAL HIGH (ref 0.44–1.00)
GFR calc Af Amer: 50 mL/min — ABNORMAL LOW (ref >60)
GFR calc non Af Amer: 43 mL/min — ABNORMAL LOW (ref >60)
Glucose, Bld: 157 mg/dL — ABNORMAL HIGH (ref 70–99)
Potassium: 4.6 mmol/L (ref 3.5–5.1)
Sodium: 130 mmol/L — ABNORMAL LOW (ref 135–145)

## 2019-10-23 LAB — BODY FLUID CELL COUNT WITH DIFFERENTIAL
Eos, Fluid: 1 %
Lymphs, Fluid: 50 %
Monocyte-Macrophage-Serous Fluid: 41 % — ABNORMAL LOW (ref 50–90)
Neutrophil Count, Fluid: 8 % (ref 0–25)
Total Nucleated Cell Count, Fluid: 21 cu mm (ref 0–1000)

## 2019-10-23 MED ORDER — LIDOCAINE HCL 1 % IJ SOLN
INTRAMUSCULAR | Status: DC | PRN
  Administered 2019-10-23: 10 mL

## 2019-10-23 MED ORDER — ALBUMIN HUMAN 25 % IV SOLN
50.0000 g | Freq: Once | INTRAVENOUS | Status: AC
  Administered 2019-10-23: 50 g via INTRAVENOUS
  Filled 2019-10-23: qty 200

## 2019-10-23 MED ORDER — ALBUMIN HUMAN 25 % IV SOLN
INTRAVENOUS | Status: AC
  Filled 2019-10-23: qty 200

## 2019-10-23 MED ORDER — LIDOCAINE HCL 1 % IJ SOLN
INTRAMUSCULAR | Status: AC
  Filled 2019-10-23: qty 20

## 2019-10-23 MED FILL — MUPIROCIN 2% OINTMENT: 2 | 30 days supply | Qty: 22 | Fill #0

## 2019-10-23 MED FILL — DOXYCYCLINE HYCLATE 100 MG: 100 | 10 days supply | Qty: 20 | Fill #0

## 2019-10-23 NOTE — Procedures (Signed)
PROCEDURE SUMMARY:  Successful US guided paracentesis from right lateral abdomen.  Yielded 8.4 L of clear yellow fluid.  No immediate complications.  Pt tolerated well.   Specimen sent for labs.  EBL < 57m  JTheresa Duty NP 10/23/2019 4:18 PM

## 2019-10-23 NOTE — Telephone Encounter (Signed)
Patient missed her paracentesis last week because she fell. Called and scheduled her for IR Paracentesis at [facility] today, 6-23 at 1:00pm. to Arrive at 12:45pm. Spoke to [facility] and confirmed it does NOT require authorization.  Called and left message for pt to call ASAP.  Could have lab work done at [facility] while she is there but I would need to fax the order over.   Pt called and confirmed she can go today at 12:45pm.

## 2019-10-23 NOTE — Progress Notes (Signed)
error 

## 2019-10-24 ENCOUNTER — Other Ambulatory Visit: Payer: Self-pay

## 2019-10-24 DIAGNOSIS — I8500 Esophageal varices without bleeding: Secondary | ICD-10-CM

## 2019-10-24 DIAGNOSIS — R188 Other ascites: Secondary | ICD-10-CM

## 2019-10-24 DIAGNOSIS — K7460 Unspecified cirrhosis of liver: Secondary | ICD-10-CM

## 2019-10-24 DIAGNOSIS — E871 Hypo-osmolality and hyponatremia: Secondary | ICD-10-CM

## 2019-10-24 DIAGNOSIS — D509 Iron deficiency anemia, unspecified: Secondary | ICD-10-CM

## 2019-10-24 NOTE — Progress Notes (Signed)
bmet due around 11-06-19. Pt informed

## 2019-10-25 LAB — PATHOLOGIST SMEAR REVIEW

## 2019-10-29 ENCOUNTER — Ambulatory Visit: Payer: No Typology Code available for payment source | Admitting: Osteopathic Medicine

## 2019-10-29 ENCOUNTER — Other Ambulatory Visit: Payer: Self-pay

## 2019-10-29 ENCOUNTER — Other Ambulatory Visit: Payer: Self-pay | Admitting: Osteopathic Medicine

## 2019-10-29 DIAGNOSIS — Z794 Long term (current) use of insulin: Secondary | ICD-10-CM

## 2019-10-29 DIAGNOSIS — E119 Type 2 diabetes mellitus without complications: Secondary | ICD-10-CM

## 2019-10-29 MED ORDER — METFORMIN HCL 1000 MG PO TABS: 1000.0000 mg | ORAL_TABLET | Freq: Two times a day (BID) | ORAL | 1 refills | Status: DC

## 2019-10-29 MED FILL — METFORMIN HCL 1000 MG TABS: 1000 | 45 days supply | Qty: 90 | Fill #0

## 2019-10-30 ENCOUNTER — Other Ambulatory Visit: Payer: Self-pay

## 2019-10-30 ENCOUNTER — Ambulatory Visit
Admission: RE | Admit: 2019-10-30 | Discharge: 2019-10-30 | Disposition: A | Discharge status: Home / Self Care | Payer: No Typology Code available for payment source | Source: Ambulatory Visit | Attending: Gastroenterology | Admitting: Gastroenterology

## 2019-10-30 ENCOUNTER — Ambulatory Visit (INDEPENDENT_AMBULATORY_CARE_PROVIDER_SITE_OTHER): Payer: No Typology Code available for payment source | Admitting: Osteopathic Medicine

## 2019-10-30 ENCOUNTER — Encounter: Payer: Self-pay | Admitting: *Deleted

## 2019-10-30 VITALS — BP 117/72 | HR 109 | Wt 141.1 lb

## 2019-10-30 DIAGNOSIS — K7460 Unspecified cirrhosis of liver: Secondary | ICD-10-CM | POA: Diagnosis not present

## 2019-10-30 DIAGNOSIS — R188 Other ascites: Secondary | ICD-10-CM

## 2019-10-30 DIAGNOSIS — E1169 Type 2 diabetes mellitus with other specified complication: Secondary | ICD-10-CM

## 2019-10-30 HISTORY — PX: IR RADIOLOGIST EVAL & MGMT: IMG5224

## 2019-10-30 LAB — POCT GLYCOSYLATED HEMOGLOBIN (HGB A1C): Hemoglobin A1C: 5.2 % (ref 4.0–5.6)

## 2019-10-30 MED ORDER — ESCITALOPRAM OXALATE 10 MG PO TABS: 5.0000 mg | ORAL_TABLET | Freq: Every day | ORAL | 1 refills | Status: DC

## 2019-10-30 NOTE — Progress Notes (Signed)
Meagan Peck is a 59 y.o. female who presents to  [facility] at [facility]  today, 10/30/19, seeking care for the following: . Diabetes - chronic, improved . Weakness/fatigue     ASSESSMENT & PLAN with other pertinent history/findings:  The primary encounter diagnosis was Type 2 diabetes mellitus with other specified complication, without long-term current use of insulin ([place]). Diagnoses of Cirrhosis of liver with ascites, unspecified hepatic cirrhosis type ([place]) and Other ascites were also pertinent to this visit.   Reviewed labs: hypoalbuminemia and anemia likely contributing to weakness   Results for orders placed or performed in visit on 10/30/19 (from the past 24 hour(s))  POCT glycosylated hemoglobin (Hb A1C)     Status: Normal   Collection Time: 10/30/19  3:45 PM  Result Value Ref Range   Hemoglobin A1C 5.2 4.0 - 5.6 %   HbA1c POC (<> result, manual entry)     HbA1c, POC (prediabetic range)     HbA1c, POC (controlled diabetic range)      DM2 better off insulin - A1C steady at 5.2, still doing well off insulin   Follow-up from last visit - new diagnosis cirrhosis after presenting here w/ significant weight gain and LE edema. Struggling w/ fatigue and hypotension. Has had a few episodes of orthostatic lightheadedness and falls. Declined PT referral. See pt instructions.   Patient Instructions  Plan:  STOP metformin  REDUCE lexapro form 10 mg to 5 mg - take 1/2 pill   Handicap parking permit filled out   For dizziness- stay hydrated, increase salt, let me know if you want to see physical therapy, if worse let me know!   Good luck w/ the TIPS procedure!       Orders Placed This Encounter  Procedures  . POCT glycosylated hemoglobin (Hb A1C)      Follow-up instructions: Return in about 6 months (around 04/30/2020) for [place] (call week prior to visit for lab orders). Plenty of other doctors encounters to keep her  busy, will see her as needed but GI/surgery to take the wheel for now                                          BP 117/72   Pulse (!) 109   Wt 141 lb 1.9 oz (64 kg)   LMP 12/03/2013   SpO2 100%   BMI 29.75 kg/m   Current Meds  Medication Sig  . ACCU-CHEK FASTCLIX LANCETS MISC Check fsbs TID  . aspirin 81 MG tablet Take 1 tablet (81 mg total) by mouth daily.  . blood glucose meter kit and supplies KIT Dispense based on patient and insurance preference. Use up to four times daily as directed. Please include lancets, test strips, control solution.  . cetirizine (ZYRTEC) 10 MG tablet Take 1 tablet (10 mg total) by mouth daily.  . Cholecalciferol (VITAMIN D3) 5000 UNITS TABS Take 5,000 Units by mouth daily.   Marland Kitchen doxycycline (VIBRAMYCIN) 100 MG capsule Take one cap PO Q12hr with food.  . escitalopram (LEXAPRO) 10 MG tablet TAKE 1 TABLET (10 MG TOTAL) BY MOUTH EVERY EVENING. (Patient taking differently: Take 10 mg by mouth at bedtime. )  . ferrous sulfate 325 (65 FE) MG EC tablet Take 1 tablet (325 mg total) by mouth 2 (two) times daily with a meal.  . glucose blood (ACCU-CHEK GUIDE) test  strip Use to check blood sugar 3 times daily. Dx:E08.00  . Insulin Pen Needle (ULTICARE SHORT PEN NEEDLES) 31G X 8 MM MISC USE AS DIRECTED TO INJECT LANTUS AND BYETTA  . Krill Oil 500 MG CAPS Take 500 mg by mouth daily.  . magnesium oxide (MAG-OX) 400 MG tablet Take 1 tablet (400 mg total) by mouth 2 (two) times daily.  . metFORMIN (GLUCOPHAGE) 1000 MG tablet Take 1 tablet (1,000 mg total) by mouth 2 (two) times daily with a meal. (Patient taking differently: Take 1,000 mg by mouth daily. )  . mupirocin ointment (BACTROBAN) 2 % Apply 1 application topically 3 (three) times daily.    Results for orders placed or performed in visit on 10/30/19 (from the past 72 hour(s))  POCT glycosylated hemoglobin (Hb A1C)     Status: Normal   Collection Time: 10/30/19  3:45 PM  Result  Value Ref Range   Hemoglobin A1C 5.2 4.0 - 5.6 %   HbA1c POC (<> result, manual entry)     HbA1c, POC (prediabetic range)     HbA1c, POC (controlled diabetic range)      IR Radiologist Eval & Mgmt  Result Date: 10/30/2019 Please refer to notes tab for details about interventional procedure. (Op Note)       All questions at time of visit were answered - patient instructed to contact office with any additional concerns or updates.  ER/RTC precautions were reviewed with the patient.  Please note: voice recognition software was used to produce this document, and typos may escape review. Please contact Dr. Sheppard Coil for any needed clarifications.

## 2019-10-30 NOTE — Patient Instructions (Signed)
Plan:  STOP metformin  REDUCE lexapro form 10 mg to 5 mg - take 1/2 pill   Handicap parking permit filled out   For dizziness- stay hydrated, increase salt, let me know if you want to see physical therapy, if worse let me know!   Good luck w/ the TIPS procedure!

## 2019-10-30 NOTE — Consult Note (Signed)
Chief Complaint: Refractory Ascites, Portal HTN    Referring Physician(s): Armbruster,Steven P  History of Present Illness: Meagan Peck is a 59 y.o. female presenting as a scheduled appointment today to [place] [facility], kindly referred by Dr. Havery Moros, for evaluation of her refractory ascites and discussing TIPS candidacy.   [name] joins [place] today by telemedicine visit.  We confirmed her identity by 2 personal identifiers.    She tells me that she has only recently started requiring large volume paracentesis for management of her ascites.  She never required LVP before April of this year.    Since August 01, 2019, she has undergone 6 LVP's.  These have ranged from 7 to 10L.  She has never had SBP.  She has never needed thoracentesis.    She has a diagnosis of cryptogenic/NASH cirrhosis.   She continues to work daily from home in [place], employed by [employer].  She completes her ADL's, though as her ascites increases it is more difficult. She is married with 1 adult daughter living locally.   She underwent upper GI 08/13/19 with discovery of >42m esophageal varices, and GOV-2 varices. She is not a candidate for b-blocker therapy given ascites.  Dr. GLyndel Safebanded varices electively 08/20/19.  Dr. AHavery Moroselectively banded additional varices 09/19/19.   She denies ever having acute upper GI hemorrhage/need for hospitalization.   She denies any episodes of encephalopathy, and has never used lactulose.   ECHO performed 07/25/19 shows normal LV EF 60-65%, and normal right ventricle. Mild MVR, with no stenosis.   Using labs spanning 08/20/19 - 10/23/19, I calculate: MELD: 19 CP: 8/B.     Creatinine is 1.37.    Past Medical History:  Diagnosis Date  . Allergy   . Arthritis   . Ascites   . Cataract   . Cirrhosis ([place]   . Diabetes mellitus without complication ([facility]   . Esophageal varices ([facility]   . GERD (gastroesophageal reflux disease)   . Hypertension   . Iron  deficiency anemia 09/26/2018    Past Surgical History:  Procedure Laterality Date  . BIOPSY  08/20/2019   Procedure: BIOPSY;  Surgeon: GJackquline Denmark MD;  [place] [facility]  Service: Gastroenterology;;  . CARPAL TUNNEL RELEASE Right   . CESAREAN SECTION    . CHOLECYSTECTOMY    . ESOPHAGEAL BANDING N/A 09/19/2019   Procedure: ESOPHAGEAL BANDING;  Surgeon: AYetta Flock MD;  [facility];  Service: Gastroenterology;  Laterality: N/A;  . ESOPHAGOGASTRODUODENOSCOPY (EGD) WITH PROPOFOL N/A 08/20/2019   Procedure: ESOPHAGOGASTRODUODENOSCOPY (EGD) WITH PROPOFOL;  Surgeon: GJackquline Denmark MD;  [place] [facility]  Service: Gastroenterology;  Laterality: N/A;  . ESOPHAGOGASTRODUODENOSCOPY (EGD) WITH PROPOFOL N/A 09/19/2019   Procedure: ESOPHAGOGASTRODUODENOSCOPY (EGD) WITH PROPOFOL;  Surgeon: AYetta Flock MD;  [facility];  Service: Gastroenterology;  Laterality: N/A;  . GASTRIC VARICES BANDING N/A 08/20/2019   Procedure: GASTRIC VARICES BANDING;  Surgeon: GJackquline Denmark MD;  [place] [facility]  Service: Gastroenterology;  Laterality: N/A;  . IR PARACENTESIS  08/01/2019  . IR PARACENTESIS  08/19/2019  . IR PARACENTESIS  09/09/2019  . IR PARACENTESIS  09/23/2019  . IR PARACENTESIS  10/08/2019  . IR PARACENTESIS  10/23/2019  . KNEE ARTHROSCOPY Right 12/10/2015   Procedure: RIGHT KNEE ARTHROSCOPY WITH PARTIAL MEDIAL MENISCECTOMY;  Surgeon: CMcarthur Rossetti MD;  [facility];  Service: Orthopedics;  Laterality: Right;  . KNEE ARTHROSCOPY W/ MENISCAL REPAIR Bilateral   . MENISCUS REPAIR Left  Allergies: Penicillins, Atrovent [ipratropium], Naproxen, Quinine derivatives, and Sulfa antibiotics  Medications: Prior to Admission medications   Medication Sig Start Date End Date Taking? Authorizing Provider  ACCU-CHEK FASTCLIX LANCETS MISC Check fsbs TID 08/09/16   [provider]  aspirin 81 MG tablet Take 1 tablet (81 mg total) by mouth  daily. 08/30/19   Cherylann Ratel A, DO  blood glucose meter kit and supplies KIT Dispense based on patient and insurance preference. Use up to four times daily as directed. Please include lancets, test strips, control solution. 05/16/19   [name], DO  cetirizine (ZYRTEC) 10 MG tablet Take 1 tablet (10 mg total) by mouth daily. 01/02/19   [name], DO  Cholecalciferol (VITAMIN D3) 5000 UNITS TABS Take 5,000 Units by mouth daily.     [provider]  doxycycline (VIBRAMYCIN) 100 MG capsule Take one cap PO Q12hr with food. 10/22/19   Kandra Nicolas, MD  escitalopram (LEXAPRO) 10 MG tablet TAKE 1 TABLET (10 MG TOTAL) BY MOUTH EVERY EVENING. Patient taking differently: Take 10 mg by mouth at bedtime.  05/31/19   [name], DO  ferrous sulfate 325 (65 FE) MG EC tablet Take 1 tablet (325 mg total) by mouth 2 (two) times daily with a meal. 09/26/18   [name], DO  glucose blood (ACCU-CHEK GUIDE) test strip Use to check blood sugar 3 times daily. Dx:E08.00 02/26/18   [name], DO  Insulin Pen Needle (ULTICARE SHORT PEN NEEDLES) 31G X 8 MM MISC USE AS DIRECTED TO INJECT LANTUS AND BYETTA 10/19/18   [name], DO  Krill Oil 500 MG CAPS Take 500 mg by mouth daily.    [provider]  magnesium oxide (MAG-OX) 400 MG tablet Take 1 tablet (400 mg total) by mouth 2 (two) times daily. 01/02/19   [name], DO  metFORMIN (GLUCOPHAGE) 1000 MG tablet Take 1 tablet (1,000 mg total) by mouth 2 (two) times daily with a meal. 10/29/19   [name], DO  mupirocin ointment (BACTROBAN) 2 % Apply 1 application topically 3 (three) times daily. 10/22/19   Kandra Nicolas, MD     Family History  Problem Relation Age of Onset  . Hypertension Mother   . COPD Mother   . Alcoholism Father   . Heart attack Father   . Diabetes Brother   . Diabetes Paternal Grandmother   . Heart disease Paternal Grandmother   . Alcoholism Paternal Uncle   .  Alcoholism Maternal Uncle   . Heart disease Maternal Grandmother   . Heart disease Maternal Grandfather   . Heart disease Paternal Grandfather     Social History   Socioeconomic History  . Marital status: Married    Spouse name: [name]  . Number of children: 1  . Years of education: Not on file  . Highest education level: Associate degree: academic program  Occupational History  . Not on file  Tobacco Use  . Smoking status: Never Smoker  . Smokeless tobacco: Never Used  Vaping Use  . Vaping Use: Never used  Substance and Sexual Activity  . Alcohol use: No  . Drug use: No  . Sexual activity: Yes    Partners: Male    Birth control/protection: None  Other Topics Concern  . Not on file  Social History Narrative  . Not on file   Social Determinants of Health   Financial Resource Strain:   . Difficulty of Paying Living Expenses:   Food Insecurity:   .  Worried About Charity fundraiser in the Last Year:   . Arboriculturist in the Last Year:   Transportation Needs:   . Film/video editor (Medical):   Marland Kitchen Lack of Transportation (Non-Medical):   Physical Activity:   . Days of Exercise per Week:   . Minutes of Exercise per Session:   Stress:   . Feeling of Stress :   Social Connections:   . Frequency of Communication with Friends and Family:   . Frequency of Social Gatherings with Friends and Family:   . Attends Religious Services:   . Active Member of Clubs or Organizations:   . Attends Archivist Meetings:   Marland Kitchen Marital Status:        Review of Systems  Review of Systems: A 12 point ROS discussed and pertinent positives are indicated in the HPI above.  All other systems are negative.  Physical Exam No direct physical exam was performed (except for noted visual exam findings with Video Visits).    Vital Signs: LMP 12/03/2013   Imaging: IR Paracentesis  Result Date: 10/23/2019 INDICATION: Patient with a history of cirrhosis and  recurrent large volume ascites. Interventional radiology asked to perform a therapeutic and diagnostic paracentesis. EXAM: ULTRASOUND GUIDED PARACENTESIS MEDICATIONS: 1% lidocaine 10 mL COMPLICATIONS: None immediate PROCEDURE: Informed written consent was obtained from the patient after a discussion of the risks, benefits and alternatives to treatment. A timeout was performed prior to the initiation of the procedure. Initial ultrasound scanning demonstrates a large amount of ascites within the right lower abdominal quadrant. The right lower abdomen was prepped and draped in the usual sterile fashion. 1% lidocaine was used for local anesthesia. Following this, a 19 gauge, 10-cm, Yueh catheter was introduced. An ultrasound image was saved for documentation purposes. The paracentesis was performed. The catheter was removed and a dressing was applied. The patient tolerated the procedure well without immediate post procedural complication. Patient received post-procedure intravenous albumin; see nursing notes for details. FINDINGS: A total of approximately 8.4 L of clear yellow fluid was removed. Samples were sent to the laboratory as requested by the clinical team. IMPRESSION: Successful ultrasound-guided paracentesis yielding 8.4 liters of peritoneal fluid. Read by: Soyla Dryer, NP Electronically Signed   By: Corrie Mckusick D.O.   On: 10/23/2019 16:19   IR Paracentesis  Result Date: 10/08/2019 INDICATION: Patient with history of cirrhosis, recurrent ascites. Request to IR for diagnostic and therapeutic paracentesis. EXAM: ULTRASOUND GUIDED DIAGNOSTIC AND THERAPEUTIC PARACENTESIS MEDICATIONS: 10 mL 1% lidocaine COMPLICATIONS: None immediate. PROCEDURE: Informed written consent was obtained from the patient after a discussion of the risks, benefits and alternatives to treatment. A timeout was performed prior to the initiation of the procedure. Initial ultrasound scanning demonstrates a large amount of ascites  within the left lower abdominal quadrant. The left lower abdomen was prepped and draped in the usual sterile fashion. 1% lidocaine was used for local anesthesia. Following this, a 19 gauge, 10-cm, Yueh catheter was introduced. An ultrasound image was saved for documentation purposes. The paracentesis was performed. The catheter was removed and a dressing was applied. The patient tolerated the procedure well without immediate post procedural complication. Patient received post-procedure intravenous albumin; see nursing notes for details. FINDINGS: A total of approximately 10.1 L of clear yellow fluid was removed. Samples were sent to the laboratory as requested by the clinical team. IMPRESSION: Successful ultrasound-guided paracentesis yielding 10.1 liters of peritoneal fluid. Read by Candiss Norse, PA-C Electronically Signed   By:  Aletta Edouard M.D.   On: 10/08/2019 16:20    Labs:  CBC: Recent Labs    08/16/19 0342 08/16/19 0342 08/17/19 0556 08/18/19 0519 08/20/19 0251 09/19/19 1307  WBC 4.9  --  4.7 3.5* 3.2*  --   HGB 9.9*   < > 9.6* 9.1* 9.1* 11.2*  HCT 31.1*   < > 28.5* 27.1* 27.2* 33.0*  PLT PLATELET CLUMPS NOTED ON SMEAR, UNABLE TO ESTIMATE  --  109* 87* 83*  --    < > = values in this interval not displayed.    COAGS: Recent Labs    07/31/19 1154 08/16/19 0014 10/16/19 1720  INR 1.4* 1.3* 1.3*    BMP: Recent Labs    08/18/19 0519 08/18/19 0519 08/19/19 1314 08/19/19 1314 08/20/19 0251 08/29/19 1529 10/03/19 1555 10/07/19 1548 10/16/19 1720 10/23/19 1616  NA 133*   < > 131*   < > 133*   < > 126* 128* 123* 130*  K 4.5   < > 4.7   < > 4.9   < > 4.7 3.9 5.5 No hemolysis seen* 4.6  CL 107   < > 102   < > 104   < > 101 105 96 102  CO2 18*   < > 19*   < > 22   < > 18* 16* 17* 19*  GLUCOSE 76   < > 155*   < > 102*   < > 141* 211* 115* 157*  BUN 24*   < > 20   < > 21*   < > 30* 34* 42* 29*  CALCIUM 8.3*   < > 8.2*   < > 8.2*   < > 9.0 9.2 9.4 9.4  CREATININE  0.93   < > 0.91   < > 0.94   < > 1.21* 1.21* 1.53* 1.37*  GFRNONAA >60  --  >60  --  >60  --   --   --   --  43*  GFRAA >60  --  >60  --  >60  --   --   --   --  50*   < > = values in this interval not displayed.    LIVER FUNCTION TESTS: Recent Labs    08/18/19 0519 08/18/19 0519 08/19/19 1314 08/20/19 0251 09/06/19 1428 09/16/19 1638  BILITOT 1.1  --   --  0.8 1.4* 1.2  AST 28  --   --  38 41* 50*  ALT 15  --   --  22 27 28   ALKPHOS 51  --   --  60 93 83  PROT 5.3*  --   --  5.1* 6.9 6.3  ALBUMIN 2.4*   < > 2.5* 2.3* 3.3* 3.3*   < > = values in this interval not displayed.    TUMOR MARKERS: Recent Labs    07/31/19 1154  AFPTM 2.3    Assessment and Plan:  Ms Welle is a very pleasant 59 yo female presenting to [facility] to discuss TIPS as an option for her refractory ascites.    I had a lengthy discussion with her regarding the pertinent anatomy, pathology/pathophysiology of liver cirrhosis, portal HTN, ascites, and varices.  I did let her know that liver transplant is the only "cure" for her cirrhosis and portal HTN, with additional management strategies for her ascites to include continued LVP, medical management, and TIPS.    I did let her know that the diagnosis of refractory ascites, given her developing renal  dysfunction and limited ability for pharm management, that prognosis is generally considered poor, with citations of 50% mortality at 1 year in this scenario.    We discussed TIPS at length as a strategy for reducing ascites, with cited success rates of decreasing need for LVP 80% at 3 months.  TIPS has also been shown to recover some kidney function in this setting of hepatorenal dysfunction.    Given her labs, decreasing renal function, MELD, CP score, and good ECHO, I think she is an appropriate candidate for TIPS for ascites control.   Risks and benefits of TIPS, and/or additional variceal embolization were discussed with Ms Desmarais including: infection,  bleeding, damage to adjacent structures, worsening hepatic and/or cardiac function, worsening and/or the development of altered mental status/encephalopathy, need for lactulose, need for additional procedure/surgery, TIPS reversal, non-target embolization, cardiopulmonary collapse, death.   After our discussion, she would like to proceed with TIPS.   Plan: - We will plan for TIPS, possible variceal embolization, with general anesthesia, at [facility], with Dr. Earleen Newport.  We will also perform paracentesis at the time of TIPS, as needed.  - Given her creatine, we will need to perform a hepatic duplex examination as a planning study instead of contrast CT imaging.   - I have encouraged her to get further paracentesis as needed.  - I have encouraged her to follow up with her scheduled physician appointments.  Thank you for this interesting consult.  I greatly enjoyed meeting Nimah P Jordahl and look forward to participating in their care.  A copy of this report was sent to the requesting provider on this date.  Electronically Signed: Corrie Mckusick 10/30/2019, 10:51 AM   I spent a total of  60 Minutes   in remote  clinical consultation, greater than 50% of which was counseling/coordinating care for cirrhosis, portal htn, refractory ascites, possible TIPS, possible embolization.    Visit type: Audio only (telephone). Audio (no video) only due to patient's lack of internet/smartphone capability. Alternative for in-person consultation at [facility], [address], [place], [place]. This visit type was conducted due to national recommendations for restrictions regarding the COVID-19 Pandemic (e.g. social distancing).  This format is felt to be most appropriate for this patient at this time.  All issues noted in this document were discussed and addressed.

## 2019-11-06 NOTE — Telephone Encounter (Signed)
-----   Message from Roetta Sessions, Alvin sent at 10/24/2019 11:43 AM EDT ----- Regarding: due for BMET Remind pt to go to the lab on Wed 7-7 or Thursday 7-8 for BMET. Order is in

## 2019-11-07 ENCOUNTER — Telehealth: Payer: Self-pay | Admitting: Gastroenterology

## 2019-11-08 ENCOUNTER — Other Ambulatory Visit (INDEPENDENT_AMBULATORY_CARE_PROVIDER_SITE_OTHER): Payer: No Typology Code available for payment source

## 2019-11-08 ENCOUNTER — Other Ambulatory Visit: Payer: Self-pay | Admitting: Gastroenterology

## 2019-11-08 ENCOUNTER — Other Ambulatory Visit (HOSPITAL_COMMUNITY)
Admission: RE | Admit: 2019-11-08 | Discharge: 2019-11-08 | Disposition: A | Discharge status: Home / Self Care | Payer: No Typology Code available for payment source | Source: Ambulatory Visit | Attending: Gastroenterology | Admitting: Gastroenterology

## 2019-11-08 DIAGNOSIS — Z20822 Contact with and (suspected) exposure to covid-19: Secondary | ICD-10-CM | POA: Diagnosis not present

## 2019-11-08 DIAGNOSIS — D509 Iron deficiency anemia, unspecified: Secondary | ICD-10-CM | POA: Diagnosis not present

## 2019-11-08 DIAGNOSIS — R188 Other ascites: Secondary | ICD-10-CM

## 2019-11-08 DIAGNOSIS — Z01812 Encounter for preprocedural laboratory examination: Secondary | ICD-10-CM | POA: Insufficient documentation

## 2019-11-08 DIAGNOSIS — K7460 Unspecified cirrhosis of liver: Secondary | ICD-10-CM

## 2019-11-08 DIAGNOSIS — E871 Hypo-osmolality and hyponatremia: Secondary | ICD-10-CM | POA: Diagnosis not present

## 2019-11-08 DIAGNOSIS — I8500 Esophageal varices without bleeding: Secondary | ICD-10-CM

## 2019-11-08 LAB — BASIC METABOLIC PANEL
BUN: 38 mg/dL — ABNORMAL HIGH (ref 6–23)
CO2: 18 mEq/L — ABNORMAL LOW (ref 19–32)
Calcium: 8.7 mg/dL (ref 8.4–10.5)
Chloride: 99 mEq/L (ref 96–112)
Creatinine, Ser: 1.34 mg/dL — ABNORMAL HIGH (ref 0.40–1.20)
GFR: 40.78 mL/min — ABNORMAL LOW (ref >60.00)
Glucose, Bld: 117 mg/dL — ABNORMAL HIGH (ref 70–99)
Potassium: 5.5 mEq/L — ABNORMAL HIGH (ref 3.5–5.1)
Sodium: 124 mEq/L — ABNORMAL LOW (ref 135–145)

## 2019-11-08 LAB — SARS CORONAVIRUS 2 (TAT 6-24 HRS): SARS Coronavirus 2: NEGATIVE

## 2019-11-08 NOTE — Telephone Encounter (Signed)
Called and spoke to pt. She said she is going to go to the lab today for BMET and then will go for her Covid test for her Tuesday, 7-13 hospital procedure.  She asked if she could get a paracentesis asap. Please advise.

## 2019-11-08 NOTE — Telephone Encounter (Signed)
Spoke to [place].  She has not been scheduled for her TIPS procedure yet.

## 2019-11-08 NOTE — Telephone Encounter (Signed)
Okay thanks. If they can accommodate her for TIPS in the near future then I would cancel her EGD as TIPS would provide therapy for the EGD. Thanks, can you keep me posted on this. Thanks

## 2019-11-08 NOTE — Telephone Encounter (Signed)
[name] this patient was seen by Dr. Earleen Newport of IR and I think scheduled for a TIPS. Can you ask her when this is being done? If she is having a TIPS in the near future they will likely also do a para at the same time. Further, if she is having a TIPS done soon then we don't need to do her EGD either. Can you please let me know if you can get ahold of her today? Thanks

## 2019-11-08 NOTE — Telephone Encounter (Signed)
Called Vickie at [facility] [phone] and LM for her to call back and let me know if pt has been scheduled for TIPS procedure.

## 2019-11-08 NOTE — Telephone Encounter (Signed)
Vickie at [facility] returned my call.  All the paperwork for pt to have TIPS has been sent to [facility] and they have requested Dr. Earleen Newport. It is possible he may be able to do the procedure on 11-13-19. They are working on to see if he would.  I let her know that Dr. Loni Muse would rather her have the TIPS and cancel her EGD on Tuesday. the 13th if that is the case. She said she would call and let them know and she also noted that pt was Covid tested today.

## 2019-11-11 ENCOUNTER — Telehealth: Payer: Self-pay | Admitting: Gastroenterology

## 2019-11-11 ENCOUNTER — Other Ambulatory Visit (HOSPITAL_COMMUNITY): Payer: Self-pay | Admitting: Interventional Radiology

## 2019-11-11 ENCOUNTER — Other Ambulatory Visit (INDEPENDENT_AMBULATORY_CARE_PROVIDER_SITE_OTHER): Payer: No Typology Code available for payment source

## 2019-11-11 ENCOUNTER — Telehealth (HOSPITAL_COMMUNITY): Payer: Self-pay

## 2019-11-11 DIAGNOSIS — R188 Other ascites: Secondary | ICD-10-CM

## 2019-11-11 DIAGNOSIS — K7460 Unspecified cirrhosis of liver: Secondary | ICD-10-CM | POA: Diagnosis not present

## 2019-11-11 LAB — BASIC METABOLIC PANEL
BUN: 31 mg/dL — ABNORMAL HIGH (ref 6–23)
CO2: 18 mEq/L — ABNORMAL LOW (ref 19–32)
Calcium: 8.5 mg/dL (ref 8.4–10.5)
Chloride: 99 mEq/L (ref 96–112)
Creatinine, Ser: 1.56 mg/dL — ABNORMAL HIGH (ref 0.40–1.20)
GFR: 34.22 mL/min — ABNORMAL LOW (ref >60.00)
Glucose, Bld: 131 mg/dL — ABNORMAL HIGH (ref 70–99)
Potassium: 5.2 mEq/L — ABNORMAL HIGH (ref 3.5–5.1)
Sodium: 125 mEq/L — ABNORMAL LOW (ref 135–145)

## 2019-11-11 NOTE — Telephone Encounter (Signed)
Thanks [name]. If she is having a TIPS in the next 1-2 weeks then let's hold off on the EGD tomorrow, please cancel it and let her know. She otherwise needs to go to the lab for a bmet today if she hasn't already done that. Thanks

## 2019-11-11 NOTE — Telephone Encounter (Signed)
Called to schedule US liver doppler, no answer, left vm. AW

## 2019-11-11 NOTE — Telephone Encounter (Signed)
Called [facility] Sched[facility] and cancelled EGD procedure for tomorrow at [facility]. Called patient and let her know.  She indicated she will go to the lab for BMET today, paracentesis tomorrow am and hepatic ultrasound tomorrow afternoon. I reiterated that they are working on scheduling her for a TIPS procedure next week. She expressed understanding and confirmed she will get to the lab today.

## 2019-11-11 NOTE — Telephone Encounter (Signed)
See phone note

## 2019-11-11 NOTE — Telephone Encounter (Signed)
Called to schedule pt for paracentesis.  The next appt available is tomorrow morning at [facility] at 9:00am. Patient has EGD at [facility] at 12:00pm but she is supposed to arrive at 10:30am.  I called IR and pt is scheduled for a hepatic ultrasound tomorrow afternoon at 2:30pm at [facility]. They are hoping to schedule her for the TIPS procedure with Dr. Earleen Newport Next week on Tuesday (7-20) or Wednesday (7-21) at [facility] depending on what the ultrasound looks like.  Do you want to proceed with EGD tomorrow?  OK to allow pt to have paracentesis tomorrow morning?

## 2019-11-12 ENCOUNTER — Ambulatory Visit (HOSPITAL_COMMUNITY)
Admission: RE | Admit: 2019-11-12 | Discharge: 2019-11-12 | Disposition: A | Discharge status: Home / Self Care | Payer: No Typology Code available for payment source | Source: Ambulatory Visit | Attending: Interventional Radiology | Admitting: Interventional Radiology

## 2019-11-12 ENCOUNTER — Other Ambulatory Visit: Payer: Self-pay

## 2019-11-12 ENCOUNTER — Ambulatory Visit (HOSPITAL_COMMUNITY)
Admission: RE | Admit: 2019-11-12 | Payer: No Typology Code available for payment source | Source: Home / Self Care | Admitting: Gastroenterology

## 2019-11-12 ENCOUNTER — Encounter (HOSPITAL_COMMUNITY): Admission: RE | Payer: Self-pay | Source: Home / Self Care

## 2019-11-12 ENCOUNTER — Ambulatory Visit (HOSPITAL_COMMUNITY)
Admission: RE | Admit: 2019-11-12 | Discharge: 2019-11-12 | Disposition: A | Discharge status: Home / Self Care | Payer: No Typology Code available for payment source | Source: Ambulatory Visit | Attending: Gastroenterology | Admitting: Gastroenterology

## 2019-11-12 DIAGNOSIS — K7460 Unspecified cirrhosis of liver: Secondary | ICD-10-CM | POA: Diagnosis not present

## 2019-11-12 DIAGNOSIS — D509 Iron deficiency anemia, unspecified: Secondary | ICD-10-CM

## 2019-11-12 DIAGNOSIS — I8500 Esophageal varices without bleeding: Secondary | ICD-10-CM

## 2019-11-12 DIAGNOSIS — R188 Other ascites: Secondary | ICD-10-CM | POA: Diagnosis present

## 2019-11-12 HISTORY — PX: IR PARACENTESIS: IMG2679

## 2019-11-12 SURGERY — ESOPHAGOSCOPY, WITH ESOPHAGEAL VARICES BAND LIGATION
Anesthesia: Monitor Anesthesia Care

## 2019-11-12 MED ORDER — ALBUMIN HUMAN 25 % IV SOLN
50.0000 g | Freq: Once | INTRAVENOUS | Status: AC
  Administered 2019-11-12: 50 g via INTRAVENOUS

## 2019-11-12 MED ORDER — ALBUMIN HUMAN 25 % IV SOLN
INTRAVENOUS | Status: AC
  Filled 2019-11-12: qty 200

## 2019-11-12 MED ORDER — LIDOCAINE HCL 1 % IJ SOLN
INTRAMUSCULAR | Status: AC
  Filled 2019-11-12: qty 20

## 2019-11-12 MED ORDER — ALBUMIN HUMAN 25 % IV SOLN
INTRAVENOUS | Status: AC
  Filled 2019-11-12: qty 100

## 2019-11-12 MED ORDER — ALBUMIN HUMAN 25 % IV SOLN
25.0000 g | Freq: Once | INTRAVENOUS | Status: AC
  Administered 2019-11-12: 25 g via INTRAVENOUS

## 2019-11-12 MED ORDER — LIDOCAINE HCL 1 % IJ SOLN
INTRAMUSCULAR | Status: DC | PRN
  Administered 2019-11-12: 10 mL

## 2019-11-12 NOTE — Procedures (Signed)
PROCEDURE SUMMARY:  Successful image-guided paracentesis from the right lower abdomen.  Yielded 10.0 liters of clear yellow fluid.  No immediate complications.  EBL: zero Patient tolerated well.   Specimen was not sent for labs.  Patient to receive post procedure albumin per IR protocol.  Please see imaging section of Epic for full dictation.  Joaquim Nam PA-C 11/12/2019 9:05 AM

## 2019-11-12 NOTE — Telephone Encounter (Signed)
[name] thanks very much for your help with her case

## 2019-11-13 ENCOUNTER — Telehealth (HOSPITAL_COMMUNITY): Payer: Self-pay | Admitting: Radiology

## 2019-11-13 NOTE — Telephone Encounter (Signed)
Called pt, left VM to update her that I am working on scheduling her TIPS with Dr. Earleen Newport for next week. Awaiting a response from anesthesia still. Will call her back as soon as I hear back from anesthesia. JM

## 2019-11-15 ENCOUNTER — Telehealth (HOSPITAL_COMMUNITY): Payer: Self-pay | Admitting: Radiology

## 2019-11-15 ENCOUNTER — Ambulatory Visit (INDEPENDENT_AMBULATORY_CARE_PROVIDER_SITE_OTHER): Payer: No Typology Code available for payment source | Admitting: Gastroenterology

## 2019-11-15 DIAGNOSIS — Z23 Encounter for immunization: Secondary | ICD-10-CM | POA: Diagnosis not present

## 2019-11-15 NOTE — Telephone Encounter (Signed)
Have made multiple attempts to reach pt without any success. Have called husband and daughter as well, no answer and no VM on either of their phones. Left 2 messages on pt's cell phone. Trying to schedule TIPS next week with Earleen Newport. JM

## 2019-11-19 ENCOUNTER — Other Ambulatory Visit (HOSPITAL_COMMUNITY): Payer: Self-pay | Admitting: Interventional Radiology

## 2019-11-19 ENCOUNTER — Other Ambulatory Visit (INDEPENDENT_AMBULATORY_CARE_PROVIDER_SITE_OTHER): Payer: No Typology Code available for payment source

## 2019-11-19 DIAGNOSIS — D509 Iron deficiency anemia, unspecified: Secondary | ICD-10-CM | POA: Diagnosis not present

## 2019-11-19 DIAGNOSIS — R188 Other ascites: Secondary | ICD-10-CM

## 2019-11-19 DIAGNOSIS — K7460 Unspecified cirrhosis of liver: Secondary | ICD-10-CM | POA: Diagnosis not present

## 2019-11-19 DIAGNOSIS — I8500 Esophageal varices without bleeding: Secondary | ICD-10-CM | POA: Diagnosis not present

## 2019-11-19 LAB — BASIC METABOLIC PANEL
BUN: 29 mg/dL — ABNORMAL HIGH (ref 6–23)
CO2: 20 mEq/L (ref 19–32)
Calcium: 8.4 mg/dL (ref 8.4–10.5)
Chloride: 98 mEq/L (ref 96–112)
Creatinine, Ser: 1.38 mg/dL — ABNORMAL HIGH (ref 0.40–1.20)
GFR: 39.42 mL/min — ABNORMAL LOW (ref >60.00)
Glucose, Bld: 142 mg/dL — ABNORMAL HIGH (ref 70–99)
Potassium: 5.2 mEq/L — ABNORMAL HIGH (ref 3.5–5.1)
Sodium: 123 mEq/L — ABNORMAL LOW (ref 135–145)

## 2019-11-19 MED FILL — MAGNESIUM OXIDE 400 MG TAB: 400 (240 MG | 60 days supply | Qty: 120 | Fill #3

## 2019-11-20 ENCOUNTER — Other Ambulatory Visit: Payer: Self-pay

## 2019-11-20 DIAGNOSIS — R188 Other ascites: Secondary | ICD-10-CM

## 2019-11-21 ENCOUNTER — Other Ambulatory Visit (HOSPITAL_COMMUNITY)
Admission: RE | Admit: 2019-11-21 | Discharge: 2019-11-21 | Disposition: A | Discharge status: Home / Self Care | Payer: No Typology Code available for payment source | Source: Ambulatory Visit | Attending: Interventional Radiology | Admitting: Interventional Radiology

## 2019-11-21 ENCOUNTER — Ambulatory Visit (HOSPITAL_COMMUNITY)
Admission: RE | Admit: 2019-11-21 | Discharge: 2019-11-21 | Disposition: A | Discharge status: Home / Self Care | Payer: No Typology Code available for payment source | Source: Ambulatory Visit | Attending: Gastroenterology | Admitting: Gastroenterology

## 2019-11-21 ENCOUNTER — Other Ambulatory Visit: Payer: Self-pay

## 2019-11-21 DIAGNOSIS — Z20822 Contact with and (suspected) exposure to covid-19: Secondary | ICD-10-CM | POA: Insufficient documentation

## 2019-11-21 DIAGNOSIS — R188 Other ascites: Secondary | ICD-10-CM | POA: Insufficient documentation

## 2019-11-21 HISTORY — PX: IR PARACENTESIS: IMG2679

## 2019-11-21 LAB — BODY FLUID CELL COUNT WITH DIFFERENTIAL
Lymphs, Fluid: 61 %
Monocyte-Macrophage-Serous Fluid: 36 % — ABNORMAL LOW (ref 50–90)
Neutrophil Count, Fluid: 2 % (ref 0–25)
Other Cells, Fluid: 1 %
Total Nucleated Cell Count, Fluid: 24 cu mm (ref 0–1000)

## 2019-11-21 LAB — SARS CORONAVIRUS 2 (TAT 6-24 HRS): SARS Coronavirus 2: NEGATIVE

## 2019-11-21 MED ORDER — LIDOCAINE HCL 1 % IJ SOLN
INTRAMUSCULAR | Status: AC
  Filled 2019-11-21: qty 20

## 2019-11-21 MED ORDER — ALBUMIN HUMAN 25 % IV SOLN: 50.0000 g | Freq: Once | INTRAVENOUS | Status: AC

## 2019-11-21 MED ORDER — ALBUMIN HUMAN 25 % IV SOLN
INTRAVENOUS | Status: AC
  Administered 2019-11-21: 50 g via INTRAVENOUS
  Filled 2019-11-21: qty 200

## 2019-11-21 NOTE — Procedures (Signed)
PROCEDURE SUMMARY:  Successful US guided paracentesis from right lateral abdomen.  Yielded 7 liters of clear yellow fluid.  No immediate complications.  Patient tolerated well.  EBL = trace  Branden Vine S Alvina Strother PA-C 11/21/2019 11:23 AM

## 2019-11-22 ENCOUNTER — Other Ambulatory Visit: Payer: Self-pay | Admitting: Radiology

## 2019-11-22 ENCOUNTER — Encounter (HOSPITAL_COMMUNITY): Payer: Self-pay | Admitting: Interventional Radiology

## 2019-11-22 LAB — PATHOLOGIST SMEAR REVIEW

## 2019-11-22 NOTE — Anesthesia Preprocedure Evaluation (Addendum)
Anesthesia Evaluation  Patient identified by MRN, date of birth, ID band Patient awake    Reviewed: Allergy & Precautions, NPO status , [name] Chart, lab work & pertinent test results  History of Anesthesia Complications Negative for: history of anesthetic complications  Airway Mallampati: II  TM Distance: >3 FB Neck ROM: Full    Dental  (+) Dental Advisory Given   Pulmonary neg recent URI,    breath sounds clear to auscultation       Cardiovascular hypertension,  Rhythm:Regular Rate:Tachycardia     Neuro/Psych negative neurological ROS  negative psych ROS   GI/Hepatic GERD  ,(+) Cirrhosis   Esophageal Varices and ascites    ,   Endo/Other  diabetes  Renal/GU Renal InsufficiencyRenal disease     Musculoskeletal  (+) Arthritis ,   Abdominal   Peds  Hematology  (+) Blood dyscrasia, anemia ,   Anesthesia Other Findings Patient is a 59 year old female scheduled for the above procedure.  Diagnosis of cirrhosis in March 2021.  Management of ascites has been quite difficult.  She had hyperkalemia on Aldactone then hypokalemia and hyponatremia on Lasix.  Last paracentesis 11/21/19.  She has not had any evidence of variceal bleeding but had 2 EGDs with banding in April and May.  She cannot tolerate beta-blockade due to her refractory ascites and resting heart rate around 60. She was referred by GI to IR for TIPS.  History includes never smoker, HTN, DM2, HLD, GERD, iron deficiency anemia, hepatic cirrhosis (with ascites, esophageal varices; suspected NASH cirrhosis).  11/21/2019 preprocedure COVID-19 test was negative.   Reproductive/Obstetrics                            Anesthesia Physical Anesthesia Plan  ASA: III  Anesthesia Plan: General   Post-op Pain Management:    Induction: Intravenous  PONV Risk Score and Plan: 3 and Ondansetron and Dexamethasone  Airway Management Planned:  Oral ETT  Additional Equipment: Arterial line  Intra-op Plan:   Post-operative Plan: Possible Post-op intubation/ventilation  Informed Consent: I have reviewed the patients History and Physical, chart, labs and discussed the procedure including the risks, benefits and alternatives for the proposed anesthesia with the patient or authorized representative who has indicated his/her understanding and acceptance.     Dental advisory given  Plan Discussed with: CRNA and Surgeon  Anesthesia Plan Comments: (PAT note written 11/22/2019 by Myra Gianotti, PA-C. )       Anesthesia Quick Evaluation

## 2019-11-22 NOTE — Progress Notes (Signed)
Anesthesia Chart Review: Meagan Peck   Case: [id number] Date/Time: 11/25/19 0745   Procedure: TIPS (N/A )   Anesthesia type: General   Pre-op diagnosis: SCLEROSIS  OF LIVER   Location: [facility] ROOM 06 / [facility]   Surgeons: Corrie Mckusick, DO      DISCUSSION: Patient is a 59 year old female scheduled for the above procedure.  Diagnosis of cirrhosis in March 2021.  Management of ascites has been quite difficult.  She had hyperkalemia on Aldactone then hypokalemia and hyponatremia on Lasix.  Last paracentesis 11/21/19.  She has not had any evidence of variceal bleeding but had 2 EGDs with banding in April and May.  She cannot tolerate beta-blockade due to her refractory ascites and resting heart rate around 60. She was referred by GI to IR for TIPS.  History includes never smoker, HTN, DM2, HLD, GERD, iron deficiency anemia, hepatic cirrhosis (with ascites, esophageal varices; suspected NASH cirrhosis).  11/21/2019 preprocedure COVID-19 test was negative.  Anesthesia team to evaluate on the day of procedure.  She is for updated labs on arrival.   VS: LMP 12/03/2013   BP Readings from Last 3 Encounters:  11/21/19 (!) 102/55  11/12/19 (!) 99/53  10/30/19 117/72   Pulse Readings from Last 3 Encounters:  10/30/19 (!) 109  10/22/19 96  10/16/19 62    PROVIDERS: [name], DO is PCP  [place] Cellar, MD is GI - She had a cardiac work-up for chest pain and syncope per Jyl Heinz, MD on 11/27/17 (see CV below)   LABS: She is for updated labs on arrival.  Last lab results include: Lab Results  Component Value Date   WBC 3.2 (L) 08/20/2019   HGB 11.2 (L) 09/19/2019   HCT 33.0 (L) 09/19/2019   PLT 83 (L) 08/20/2019   GLUCOSE 142 (H) 11/19/2019   ALT 28 09/16/2019   AST 50 (H) 09/16/2019   NA 123 (L) 11/19/2019   K 5.2 (H) 11/19/2019   CL 98 11/19/2019   CREATININE 1.38 (H) 11/19/2019   BUN 29 (H) 11/19/2019   CO2 20 11/19/2019   INR 1.3 (H) 10/16/2019   HGBA1C 5.2  10/30/2019    Endoscopic history (as outlined by Dr. Havery Moros): EGD 09/19/19 -  - Esophageal varices noted as outlined above - 5 bands placed resulting in deflation. - Normal stomach. - Benign small polyp of the duodenum previously biopsied, not biopsied today. - Normal duodenum otherwise  EGD 08/20/19 - Grade II and large (> 5 mm) esophageal varices.  Banded x 5 - Portal hypertensive gastropathy. - Single nodule in duodenal bulb. Biopsied.  Colonoscopy 08/13/19 - A few small, nonbleeding sigmoid, descending, IC valve AVMs, were not treated or ablated. Small internal hemorrhoids.   IMAGES: [place] Abd 07/26/19: IMPRESSION: 1. Liver is nodular and compatible with cirrhosis. Splenomegaly with a large volume of ascites. Findings are suggestive for portal hypertension. 2. No discrete liver lesion. 3. Cholecystectomy.  No biliary dilatation.   EKG: 08/15/19: Normal sinus rhythm Low voltage QRS Cannot rule out Anterior infarct , age undetermined Abnormal ECG Confirmed by Madalyn Rob [phone]) on 08/16/2019 4:15:47 PM   CV: Echo 07/25/19: IMPRESSIONS  1. Left ventricular ejection fraction, by estimation, is 60 to 65%. The  left ventricle has normal function. The left ventricle has no regional  wall motion abnormalities. Left ventricular diastolic parameters were  normal.  2. Right ventricular systolic function is normal. The right ventricular  size is normal. There is normal pulmonary artery systolic pressure.  3. There is mild to moderate posterior annular calcification. Mild mitral  valve regurgitation. No evidence of mitral stenosis.  4. The aortic valve is normal in structure. Aortic valve regurgitation is  not visualized. No aortic stenosis is present.  5. The inferior vena cava is normal in size with greater than 50%  respiratory variability, suggesting right atrial pressure of 3 mmHg.    Nuclear stress test 11/29/17:  Nuclear stress EF: 72%.  Blood pressure  demonstrated a normal response to exercise.  No T wave inversion was noted during stress.  There was no ST segment deviation noted during stress.  Defect 1: There is a small defect of mild severity.  This is a low risk study. Small size, mild intensity fixed apical/apical lateral attenuation artifact. No reversible ischemia. LVEF 72% with normal wall motion. This is a low risk study.   Carotid US 11/24/17: Final Interpretation:  Right Carotid: Velocities in the right ICA are consistent with a 1-39%  stenosis.  Left Carotid: Velocities in the left ICA are consistent with a 1-39%  stenosis.  Vertebrals: Bilateral vertebral arteries demonstrate antegrade flow.  Subclavians: Normal flow hemodynamics were seen in bilateral subclavian        arteries.    Holter Monitor 11/21/17:  Duration of test:           48 hours Indication:                    Syncope and collapse Baseline rhythm: Sinus Minimum heart rate: 61 BPM.  Average heart rate: 82 BPM.  Maximal heart rate 137 BPM. Atrial arrhythmia: None significant.  Ventricular arrhythmia: None significant  Conduction abnormality: None significant Symptoms: None significant Conclusion:  Holter monitoring was unremarkable.   Past Medical History:  Diagnosis Date  . Acute medial meniscus tear of right knee 12/10/2015  . Allergy   . Arthritis   . Ascites   . Cataract   . Cirrhosis ([place])   . Diabetes mellitus without complication ([place])   . Esophageal varices ([place])   . GERD (gastroesophageal reflux disease)   . Hepatic cirrhosis ([place])   . HLD (hyperlipidemia)   . Hypertension   . Iron deficiency anemia 09/26/2018    Past Surgical History:  Procedure Laterality Date  . BIOPSY  08/20/2019   Procedure: BIOPSY;  Surgeon: Jackquline Denmark, MD;  Location: [employer];  Service: Gastroenterology;;  . CARPAL TUNNEL RELEASE Right   . CESAREAN SECTION    . CHOLECYSTECTOMY    . ESOPHAGEAL BANDING N/A 09/19/2019   Procedure:  ESOPHAGEAL BANDING;  Surgeon: Yetta Flock, MD;  Location: [facility];  Service: Gastroenterology;  Laterality: N/A;  . ESOPHAGOGASTRODUODENOSCOPY (EGD) WITH PROPOFOL N/A 08/20/2019   Procedure: ESOPHAGOGASTRODUODENOSCOPY (EGD) WITH PROPOFOL;  Surgeon: Jackquline Denmark, MD;  Location: [facility];  Service: Gastroenterology;  Laterality: N/A;  . ESOPHAGOGASTRODUODENOSCOPY (EGD) WITH PROPOFOL N/A 09/19/2019   Procedure: ESOPHAGOGASTRODUODENOSCOPY (EGD) WITH PROPOFOL;  Surgeon: Yetta Flock, MD;  Location: [facility];  Service: Gastroenterology;  Laterality: N/A;  . GASTRIC VARICES BANDING N/A 08/20/2019   Procedure: GASTRIC VARICES BANDING;  Surgeon: Jackquline Denmark, MD;  Location: [facility];  Service: Gastroenterology;  Laterality: N/A;  . IR PARACENTESIS  08/01/2019  . IR PARACENTESIS  08/19/2019  . IR PARACENTESIS  09/09/2019  . IR PARACENTESIS  09/23/2019  . IR PARACENTESIS  10/08/2019  . IR PARACENTESIS  10/23/2019  . IR PARACENTESIS  11/12/2019  . IR PARACENTESIS  11/21/2019  . IR RADIOLOGIST EVAL & MGMT  10/30/2019  . KNEE ARTHROSCOPY Right 12/10/2015   Procedure: RIGHT KNEE ARTHROSCOPY WITH PARTIAL MEDIAL MENISCECTOMY;  Surgeon: Mcarthur Rossetti, MD;  Location: [facility];  Service: Orthopedics;  Laterality: Right;  . KNEE ARTHROSCOPY W/ MENISCAL REPAIR Bilateral   . MENISCUS REPAIR Left     MEDICATIONS: No current facility-administered medications for this encounter.   Marland Kitchen ACCU-CHEK FASTCLIX LANCETS MISC  . aspirin 81 MG tablet  . cetirizine (ZYRTEC) 10 MG tablet  . Cholecalciferol (VITAMIN D3) 5000 UNITS TABS  . doxycycline (VIBRAMYCIN) 100 MG capsule  . escitalopram (LEXAPRO) 10 MG tablet  . ferrous sulfate 325 (65 FE) MG EC tablet  . Krill Oil 500 MG CAPS  . magnesium oxide (MAG-OX) 400 MG tablet  . mupirocin ointment (BACTROBAN) 2 %     Myra Gianotti, PA-C Surgical Short Stay/Anesthesiology [facility] Phone [phone] [facility] Phone [phone] 11/22/2019 4:09  PM

## 2019-11-22 NOTE — Progress Notes (Signed)
PCP - [name], DO Cardiologist - n/a [place] - Dr Ferney Cellar  Chest x-ray - n/a EKG - 08/15/19 Stress Test - 11/29/17 ECHO - 07/25/19 Cardiac Cath - n/a  Fasting Blood Sugar - 88-120s Checks Blood Sugar _EOD x 1 times a day DM Type 2, no meds.  . If your blood sugar is less than 70 mg/dL, you will need to treat for low blood sugar: o Treat a low blood sugar (less than 70 mg/dL) with  cup of clear juice (cranberry or apple), 4 glucose tablets, OR glucose gel. o Recheck blood sugar in 15 minutes after treatment (to make sure it is greater than 70 mg/dL). If your blood sugar is not greater than 70 mg/dL on recheck, call [phone] for further instructions.  Aspirin Instructions: Follow your surgeon's instructions on when to stop aspirin prior to surgery,  If no instructions were given by your surgeon then you will need to call the office for those instructions.  Anesthesia review: Yes  STOP now taking any Aspirin (unless otherwise instructed by your surgeon), Aleve, Naproxen, Ibuprofen, Motrin, Advil, Goody's, BC's, all herbal medications, fish oil, and all vitamins.   Coronavirus Screening Covid test on 11/21/19 was negative. Patient denies shortness of breath, fever, cough or chest pain.  Patient verbalized understanding of instructions that were given via phone.

## 2019-11-25 ENCOUNTER — Encounter (HOSPITAL_COMMUNITY): Payer: Self-pay

## 2019-11-25 ENCOUNTER — Ambulatory Visit (HOSPITAL_COMMUNITY): Payer: No Typology Code available for payment source | Admitting: Vascular Surgery

## 2019-11-25 ENCOUNTER — Observation Stay (HOSPITAL_COMMUNITY)
Admission: RE | Admit: 2019-11-25 | Discharge: 2019-11-26 | Disposition: A | Discharge status: Home / Self Care | Payer: No Typology Code available for payment source | Attending: Interventional Radiology | Admitting: Interventional Radiology

## 2019-11-25 ENCOUNTER — Encounter (HOSPITAL_COMMUNITY): Payer: Self-pay | Admitting: Interventional Radiology

## 2019-11-25 ENCOUNTER — Encounter (HOSPITAL_COMMUNITY)
Admission: RE | Disposition: A | Discharge status: Home / Self Care | Payer: No Typology Code available for payment source | Source: Home / Self Care | Attending: Interventional Radiology

## 2019-11-25 ENCOUNTER — Ambulatory Visit (HOSPITAL_COMMUNITY)
Admission: RE | Admit: 2019-11-25 | Discharge: 2019-11-25 | Disposition: A | Discharge status: Home / Self Care | Payer: No Typology Code available for payment source | Source: Ambulatory Visit | Attending: Interventional Radiology | Admitting: Interventional Radiology

## 2019-11-25 ENCOUNTER — Other Ambulatory Visit: Payer: Self-pay

## 2019-11-25 DIAGNOSIS — R188 Other ascites: Secondary | ICD-10-CM | POA: Insufficient documentation

## 2019-11-25 DIAGNOSIS — E119 Type 2 diabetes mellitus without complications: Secondary | ICD-10-CM | POA: Diagnosis not present

## 2019-11-25 DIAGNOSIS — Z886 Allergy status to analgesic agent status: Secondary | ICD-10-CM | POA: Diagnosis not present

## 2019-11-25 DIAGNOSIS — K7581 Nonalcoholic steatohepatitis (NASH): Secondary | ICD-10-CM | POA: Diagnosis not present

## 2019-11-25 DIAGNOSIS — Z7982 Long term (current) use of aspirin: Secondary | ICD-10-CM | POA: Diagnosis not present

## 2019-11-25 DIAGNOSIS — Z79899 Other long term (current) drug therapy: Secondary | ICD-10-CM | POA: Insufficient documentation

## 2019-11-25 DIAGNOSIS — I10 Essential (primary) hypertension: Secondary | ICD-10-CM | POA: Insufficient documentation

## 2019-11-25 DIAGNOSIS — E785 Hyperlipidemia, unspecified: Secondary | ICD-10-CM | POA: Insufficient documentation

## 2019-11-25 DIAGNOSIS — Z8679 Personal history of other diseases of the circulatory system: Secondary | ICD-10-CM

## 2019-11-25 DIAGNOSIS — Z8719 Personal history of other diseases of the digestive system: Secondary | ICD-10-CM

## 2019-11-25 DIAGNOSIS — I8510 Secondary esophageal varices without bleeding: Secondary | ICD-10-CM | POA: Insufficient documentation

## 2019-11-25 DIAGNOSIS — K219 Gastro-esophageal reflux disease without esophagitis: Secondary | ICD-10-CM | POA: Insufficient documentation

## 2019-11-25 DIAGNOSIS — Z880 Allergy status to penicillin: Secondary | ICD-10-CM | POA: Insufficient documentation

## 2019-11-25 DIAGNOSIS — D509 Iron deficiency anemia, unspecified: Secondary | ICD-10-CM | POA: Insufficient documentation

## 2019-11-25 DIAGNOSIS — K766 Portal hypertension: Secondary | ICD-10-CM | POA: Diagnosis not present

## 2019-11-25 DIAGNOSIS — K7460 Unspecified cirrhosis of liver: Secondary | ICD-10-CM | POA: Insufficient documentation

## 2019-11-25 DIAGNOSIS — Z882 Allergy status to sulfonamides status: Secondary | ICD-10-CM | POA: Insufficient documentation

## 2019-11-25 DIAGNOSIS — Z833 Family history of diabetes mellitus: Secondary | ICD-10-CM | POA: Insufficient documentation

## 2019-11-25 DIAGNOSIS — Z8249 Family history of ischemic heart disease and other diseases of the circulatory system: Secondary | ICD-10-CM | POA: Insufficient documentation

## 2019-11-25 HISTORY — DX: Hyperlipidemia, unspecified: E78.5

## 2019-11-25 HISTORY — PX: IR TIPS: IMG2295

## 2019-11-25 HISTORY — DX: Unspecified cirrhosis of liver: K74.60

## 2019-11-25 HISTORY — PX: IR PARACENTESIS: IMG2679

## 2019-11-25 HISTORY — PX: RADIOLOGY WITH ANESTHESIA: SHX6223

## 2019-11-25 LAB — PROTIME-INR
INR: 1.5 — ABNORMAL HIGH (ref 0.8–1.2)
Prothrombin Time: 17.4 seconds — ABNORMAL HIGH (ref 11.4–15.2)

## 2019-11-25 LAB — CBC WITH DIFFERENTIAL/PLATELET
Abs Immature Granulocytes: 0.05 10*3/uL (ref 0.00–0.07)
Basophils Absolute: 0.1 10*3/uL (ref 0.0–0.1)
Basophils Relative: 1 %
Eosinophils Absolute: 0.1 10*3/uL (ref 0.0–0.5)
Eosinophils Relative: 2 %
HCT: 27.7 % — ABNORMAL LOW (ref 36.0–46.0)
Hemoglobin: 9.1 g/dL — ABNORMAL LOW (ref 12.0–15.0)
Immature Granulocytes: 1 %
Lymphocytes Relative: 14 %
Lymphs Abs: 1.1 10*3/uL (ref 0.7–4.0)
MCH: 29.6 pg (ref 26.0–34.0)
MCHC: 32.9 g/dL (ref 30.0–36.0)
MCV: 90.2 fL (ref 80.0–100.0)
Monocytes Absolute: 1 10*3/uL (ref 0.1–1.0)
Monocytes Relative: 13 %
Neutro Abs: 5.4 10*3/uL (ref 1.7–7.7)
Neutrophils Relative %: 69 %
Platelets: 153 10*3/uL (ref 150–400)
RBC: 3.07 MIL/uL — ABNORMAL LOW (ref 3.87–5.11)
RDW: 15.5 % (ref 11.5–15.5)
WBC: 7.7 10*3/uL (ref 4.0–10.5)
nRBC: 0 % (ref 0.0–0.2)

## 2019-11-25 LAB — POCT I-STAT 7, (LYTES, BLD GAS, ICA,H+H)
Acid-base deficit: 8 mmol/L — ABNORMAL HIGH (ref 0.0–2.0)
Acid-base deficit: 8 mmol/L — ABNORMAL HIGH (ref 0.0–2.0)
Bicarbonate: 17.3 mmol/L — ABNORMAL LOW (ref 20.0–28.0)
Bicarbonate: 17.8 mmol/L — ABNORMAL LOW (ref 20.0–28.0)
Calcium, Ion: 1.19 mmol/L (ref 1.15–1.40)
Calcium, Ion: 1.18 mmol/L (ref 1.15–1.40)
HCT: 22 % — ABNORMAL LOW (ref 36.0–46.0)
HCT: 26 % — ABNORMAL LOW (ref 36.0–46.0)
Hemoglobin: 7.5 g/dL — ABNORMAL LOW (ref 12.0–15.0)
Hemoglobin: 8.8 g/dL — ABNORMAL LOW (ref 12.0–15.0)
O2 Saturation: 100 %
O2 Saturation: 99 %
Patient temperature: 36.1
Patient temperature: 36.4
Potassium: 4.8 mmol/L (ref 3.5–5.1)
Potassium: 5.3 mmol/L — ABNORMAL HIGH (ref 3.5–5.1)
Sodium: 132 mmol/L — ABNORMAL LOW (ref 135–145)
Sodium: 131 mmol/L — ABNORMAL LOW (ref 135–145)
TCO2: 18 mmol/L — ABNORMAL LOW (ref 22–32)
TCO2: 19 mmol/L — ABNORMAL LOW (ref 22–32)
pCO2 arterial: 32.2 mmHg (ref 32.0–48.0)
pCO2 arterial: 36.7 mmHg (ref 32.0–48.0)
pH, Arterial: 7.334 — ABNORMAL LOW (ref 7.350–7.450)
pH, Arterial: 7.291 — ABNORMAL LOW (ref 7.350–7.450)
pO2, Arterial: 179 mmHg — ABNORMAL HIGH (ref 83.0–108.0)
pO2, Arterial: 174 mmHg — ABNORMAL HIGH (ref 83.0–108.0)

## 2019-11-25 LAB — PREPARE RBC (CROSSMATCH)

## 2019-11-25 LAB — COMPREHENSIVE METABOLIC PANEL
ALT: 19 U/L (ref 0–44)
AST: 30 U/L (ref 15–41)
Albumin: 2.4 g/dL — ABNORMAL LOW (ref 3.5–5.0)
Alkaline Phosphatase: 106 U/L (ref 38–126)
Anion gap: 9 (ref 5–15)
BUN: 28 mg/dL — ABNORMAL HIGH (ref 6–20)
CO2: 17 mmol/L — ABNORMAL LOW (ref 22–32)
Calcium: 8.3 mg/dL — ABNORMAL LOW (ref 8.9–10.3)
Chloride: 104 mmol/L (ref 98–111)
Creatinine, Ser: 1.96 mg/dL — ABNORMAL HIGH (ref 0.44–1.00)
GFR calc Af Amer: 32 mL/min — ABNORMAL LOW (ref >60)
GFR calc non Af Amer: 28 mL/min — ABNORMAL LOW (ref >60)
Glucose, Bld: 149 mg/dL — ABNORMAL HIGH (ref 70–99)
Potassium: 4.8 mmol/L (ref 3.5–5.1)
Sodium: 130 mmol/L — ABNORMAL LOW (ref 135–145)
Total Bilirubin: 1.9 mg/dL — ABNORMAL HIGH (ref 0.3–1.2)
Total Protein: 5.2 g/dL — ABNORMAL LOW (ref 6.5–8.1)

## 2019-11-25 LAB — APTT: aPTT: 35 seconds (ref 24–36)

## 2019-11-25 LAB — GLUCOSE, CAPILLARY
Glucose-Capillary: 156 mg/dL — ABNORMAL HIGH (ref 70–99)
Glucose-Capillary: 127 mg/dL — ABNORMAL HIGH (ref 70–99)

## 2019-11-25 LAB — ABO/RH: ABO/RH(D): A POS

## 2019-11-25 SURGERY — IR WITH ANESTHESIA
Anesthesia: General

## 2019-11-25 MED ORDER — FERROUS SULFATE 325 (65 FE) MG PO TABS
325.0000 mg | ORAL_TABLET | Freq: Two times a day (BID) | ORAL | Status: DC
  Administered 2019-11-25 – 2019-11-26 (×2): 325 mg via ORAL
  Filled 2019-11-25 (×2): qty 1

## 2019-11-25 MED ORDER — MIDAZOLAM HCL 2 MG/2ML IJ SOLN
INTRAMUSCULAR | Status: DC | PRN
  Administered 2019-11-25: 2 mg via INTRAVENOUS

## 2019-11-25 MED ORDER — PROPOFOL 10 MG/ML IV BOLUS
INTRAVENOUS | Status: DC | PRN
  Administered 2019-11-25 (×2): 30 mg via INTRAVENOUS
  Administered 2019-11-25: 100 mg via INTRAVENOUS
  Administered 2019-11-25: 40 mg via INTRAVENOUS

## 2019-11-25 MED ORDER — ALBUMIN HUMAN 25 % IV SOLN
12.5000 g | Freq: Once | INTRAVENOUS | Status: AC
  Administered 2019-11-25: 12.5 g via INTRAVENOUS
  Filled 2019-11-25: qty 50

## 2019-11-25 MED ORDER — ONDANSETRON HCL 4 MG/2ML IJ SOLN
INTRAMUSCULAR | Status: DC | PRN
  Administered 2019-11-25: 4 mg via INTRAVENOUS

## 2019-11-25 MED ORDER — CHLORHEXIDINE GLUCONATE 0.12 % MT SOLN
OROMUCOSAL | Status: AC
  Administered 2019-11-25: 15 mL via OROMUCOSAL
  Filled 2019-11-25: qty 15

## 2019-11-25 MED ORDER — CHLORHEXIDINE GLUCONATE 0.12 % MT SOLN: 15.0000 mL | Freq: Once | OROMUCOSAL | Status: AC

## 2019-11-25 MED ORDER — MAGNESIUM OXIDE 400 (241.3 MG) MG PO TABS
400.0000 mg | ORAL_TABLET | Freq: Two times a day (BID) | ORAL | Status: DC
  Administered 2019-11-25 – 2019-11-26 (×2): 400 mg via ORAL
  Filled 2019-11-25 (×2): qty 1

## 2019-11-25 MED ORDER — FENTANYL CITRATE (PF) 250 MCG/5ML IJ SOLN
INTRAMUSCULAR | Status: AC
  Filled 2019-11-25: qty 5

## 2019-11-25 MED ORDER — IOHEXOL 300 MG/ML  SOLN
150.0000 mL | Freq: Once | INTRAMUSCULAR | Status: AC | PRN
  Administered 2019-11-25: 44 mL via INTRA_ARTERIAL

## 2019-11-25 MED ORDER — SODIUM CHLORIDE 0.9 % IV SOLN: INTRAVENOUS | Status: DC

## 2019-11-25 MED ORDER — CLINDAMYCIN PHOSPHATE 600 MG/50ML IV SOLN
600.0000 mg | Freq: Once | INTRAVENOUS | Status: AC
  Administered 2019-11-25: 600 mg via INTRAVENOUS
  Filled 2019-11-25 (×2): qty 50

## 2019-11-25 MED ORDER — PROPOFOL 10 MG/ML IV BOLUS
INTRAVENOUS | Status: AC
  Filled 2019-11-25: qty 40

## 2019-11-25 MED ORDER — FENTANYL CITRATE (PF) 250 MCG/5ML IJ SOLN
INTRAMUSCULAR | Status: DC | PRN
  Administered 2019-11-25: 50 ug via INTRAVENOUS

## 2019-11-25 MED ORDER — SUGAMMADEX SODIUM 200 MG/2ML IV SOLN
INTRAVENOUS | Status: DC | PRN
  Administered 2019-11-25 (×2): 100 mg via INTRAVENOUS

## 2019-11-25 MED ORDER — LACTATED RINGERS IV SOLN
INTRAVENOUS | Status: DC | PRN
Start: 2019-11-25 — End: 2019-11-25

## 2019-11-25 MED ORDER — LACTATED RINGERS IV SOLN: INTRAVENOUS | Status: DC

## 2019-11-25 MED ORDER — ASPIRIN EC 81 MG PO TBEC
81.0000 mg | DELAYED_RELEASE_TABLET | Freq: Every day | ORAL | Status: DC
  Filled 2019-11-25: qty 1

## 2019-11-25 MED ORDER — DEXMEDETOMIDINE (PRECEDEX) IN NS 20 MCG/5ML (4 MCG/ML) IV SYRINGE
PREFILLED_SYRINGE | INTRAVENOUS | Status: AC
  Filled 2019-11-25: qty 5

## 2019-11-25 MED ORDER — ROCURONIUM BROMIDE 10 MG/ML (PF) SYRINGE
PREFILLED_SYRINGE | INTRAVENOUS | Status: DC | PRN
  Administered 2019-11-25: 50 mg via INTRAVENOUS

## 2019-11-25 MED ORDER — MIDAZOLAM HCL 2 MG/2ML IJ SOLN
INTRAMUSCULAR | Status: AC
  Filled 2019-11-25: qty 2

## 2019-11-25 MED ORDER — DEXAMETHASONE SODIUM PHOSPHATE 10 MG/ML IJ SOLN
INTRAMUSCULAR | Status: DC | PRN
  Administered 2019-11-25: 4 mg via INTRAVENOUS

## 2019-11-25 MED ORDER — LIDOCAINE 2% (20 MG/ML) 5 ML SYRINGE
INTRAMUSCULAR | Status: DC | PRN
  Administered 2019-11-25: 25 mg via INTRAVENOUS

## 2019-11-25 MED ORDER — PHENYLEPHRINE HCL-NACL 10-0.9 MG/250ML-% IV SOLN
INTRAVENOUS | Status: DC | PRN
  Administered 2019-11-25: 20 ug/min via INTRAVENOUS

## 2019-11-25 MED ORDER — ORAL CARE MOUTH RINSE: 15.0000 mL | Freq: Once | OROMUCOSAL | Status: AC

## 2019-11-25 MED ORDER — FENTANYL CITRATE (PF) 100 MCG/2ML IJ SOLN: 25.0000 ug | INTRAMUSCULAR | Status: DC | PRN

## 2019-11-25 MED ORDER — IOHEXOL 300 MG/ML  SOLN
150.0000 mL | Freq: Once | INTRAMUSCULAR | Status: AC | PRN
  Administered 2019-11-25: 44 mL via INTRAVENOUS

## 2019-11-25 NOTE — Sedation Documentation (Signed)
Patient placed on table. Anesthesia to monitor and care for patient during procedure.

## 2019-11-25 NOTE — Anesthesia Procedure Notes (Signed)
Procedure Name: Intubation Date/Time: 11/25/2019 8:36 AM Performed by: Janace Litten, CRNA Pre-anesthesia Checklist: Patient identified, Emergency Drugs available, Suction available and Patient being monitored Patient Re-evaluated:Patient Re-evaluated prior to induction Oxygen Delivery Method: Circle System Utilized Preoxygenation: Pre-oxygenation with 100% oxygen Induction Type: IV induction Ventilation: Mask ventilation without difficulty Laryngoscope Size: Miller and 2 Grade View: Grade I Tube type: Oral Tube size: 7.0 mm Number of attempts: 1 Airway Equipment and Method: Stylet and Oral airway Placement Confirmation: ETT inserted through vocal cords under direct vision,  positive ETCO2 and breath sounds checked- equal and bilateral Secured at: 21 cm Tube secured with: Tape Dental Injury: Teeth and Oropharynx as per pre-operative assessment

## 2019-11-25 NOTE — Anesthesia Procedure Notes (Signed)
Arterial Line Insertion Start/End7/26/2021 7:35 AM, 11/25/2019 7:40 AM Performed by: Junie Bame, RN  Patient location: Pre-op. Preanesthetic checklist: patient identified, IV checked, site marked, risks and benefits discussed, surgical consent, monitors and equipment checked, pre-op evaluation, timeout performed and anesthesia consent Lidocaine 1% used for infiltration Left, radial was placed Catheter size: 20 G Hand hygiene performed  and maximum sterile barriers used   Attempts: 1 Procedure performed without using ultrasound guided technique. Following insertion, Biopatch and dressing applied. Post procedure assessment: normal

## 2019-11-25 NOTE — Procedures (Signed)
Interventional Radiology Procedure Note  Procedure:    Attempted TIPS, failed given the anatomy.  US guided paracentesis US guided right IJ puncture  Complications: None  Recommendations:  - Stable to PACU - will observe 23 hrs in house - continue large volume paracentesis.  - Will follow up with Dr. Earleen Newport in [place] clinic, with consideration of non-contrast MR or CT for further planning of another TIPS attempt - Do not submerge for 7 days   Signed,  Dulcy Fanny. Earleen Newport, DO

## 2019-11-25 NOTE — Progress Notes (Addendum)
Referring Physician(s): Dr Havery Moros  Supervising Physician: Corrie Mckusick  Patient Status:  [facility] - In-pt  Chief Complaint:  Refractory ascites NASH Cirrhosis  Subjective:  TIPS attempt in IR today Anatomy unusual-- unable to complete TIPS Paracentesis during procedure 7 Liters fluid  Pt feels good up in room No complaints; No N/V Eating reg diet Rt IJ NT Abd sites are NT  Dr Earleen Newport has evaluated pt at bedside   Allergies: Penicillins, Atrovent [ipratropium], Naproxen, Quinine derivatives, and Sulfa antibiotics  Medications: Prior to Admission medications   Medication Sig Start Date End Date Taking? Authorizing Provider  aspirin 81 MG tablet Take 1 tablet (81 mg total) by mouth daily. Patient taking differently: Take 81 mg by mouth at bedtime.  08/30/19  Yes Kyle, Tyrone A, DO  cetirizine (ZYRTEC) 10 MG tablet Take 1 tablet (10 mg total) by mouth daily. Patient taking differently: Take 10 mg by mouth daily as needed for allergies.  01/02/19  Yes [name], DO  Cholecalciferol (VITAMIN D3) 5000 UNITS TABS Take 5,000 Units by mouth daily.    Yes [provider]  ferrous sulfate 325 (65 FE) MG EC tablet Take 1 tablet (325 mg total) by mouth 2 (two) times daily with a meal. 09/26/18  Yes [name], DO  Krill Oil 500 MG CAPS Take 500 mg by mouth daily.   Yes [provider]  magnesium oxide (MAG-OX) 400 MG tablet Take 1 tablet (400 mg total) by mouth 2 (two) times daily. 01/02/19  Yes [name], DO  mupirocin ointment (BACTROBAN) 2 % Apply 1 application topically 3 (three) times daily. Patient taking differently: Apply 1 application topically daily.  10/22/19  Yes Kandra Nicolas, MD  ACCU-CHEK FASTCLIX LANCETS MISC Check fsbs TID 08/09/16   [provider]  doxycycline (VIBRAMYCIN) 100 MG capsule Take one cap PO Q12hr with food. Patient not taking: Reported on 11/25/2019 10/22/19   Kandra Nicolas, MD  escitalopram  (LEXAPRO) 10 MG tablet Take 0.5 tablets (5 mg total) by mouth daily. Patient not taking: Reported on 11/25/2019 10/30/19   [name], DO     Vital Signs: BP (!) 97/60 (BP Location: Left Arm)   Pulse 100   Temp 97.9 F (36.6 C) (Oral)   Resp 17   Ht 4' 10"  (1.473 m)   Wt 147 lb (66.7 kg)   LMP 12/03/2013 (LMP Unknown)   SpO2 100%   BMI 30.72 kg/m   Physical Exam Vitals reviewed.  Skin:    General: Skin is warm.     Comments: Site at Rt IJ is NT No bleeding No hematoma  Sites at Abd are clean and dry NT no bleeding   Neurological:     Mental Status: She is alert and oriented to person, place, and time.  Psychiatric:        Behavior: Behavior normal.     Imaging: IR Tips  Result Date: 11/25/2019 CLINICAL DATA:  59 year old female with a history of portal hypertension, refractory ascites, presenting for tips EXAM: ULTRASOUND-GUIDED PARACENTESIS ABORTED TIPS ULTRASOUND-GUIDED ACCESS RIGHT INTERNAL JUGULAR VEIN MEDICATIONS: Clindamycin antibiotics ANESTHESIA/SEDATION: General - as administered by the Anesthesia department The patient was continuously monitored during the procedure by the interventional radiology nurse under my direct supervision. CONTRAST:  49m OMNIPAQUE IOHEXOL 300 MG/ML SOLN, 49mOMNIPAQUE IOHEXOL 300 MG/ML SOLN FLUOROSCOPY TIME:  Fluoroscopy Time: 31 minutes 6 seconds (602 mGy). COMPLICATIONS: None PROCEDURE: Informed written consent was obtained from the patient's family after a thorough discussion  of the procedural risks, benefits and alternatives. Specific risks discussed with TIPS/variceal embolization included: Bleeding, infection, vascular injury, need for further procedure/surgery, renal injury/renal failure, contrast reaction, non-target embolization, liver dysfunction/failure, hepatic encephalopathy, stroke (~1%), cardiopulmonary collapse, death. All questions were addressed. Maximal Sterile Barrier Technique was utilized including caps, mask,  sterile gowns, sterile gloves, sterile drape, hand hygiene and skin antiseptic. A timeout was performed prior to the initiation of the procedure. Patient was positioned supine position on the table, with the right upper quadrant and the right neck prepped and draped in the usual sterile fashion. Ultrasound survey of the right upper quadrant was performed, with images stored and sent to PACs. Ultrasound images of the abdomen performed 2 identifies safe site for paracentesis. A small stab incision was made, and then a Safe-T-Centesis kit was advanced into the abdomen. We initiated large volume paracentesis with vacuo-tainer. Using ultrasound guidance, Chiba needle was used access the right portal system. Once we confirmed position in the portal system with portal venography, micro wire was advanced into the inferior mesenteric vein. The inner dilator was advanced over the metal stiffener into the portal system, with the inner dilator advanced over the wire. Venogram confirmed are location within the portal system. The micro wire was then replaced into the inner dilator, with the transition point position at the apex of the catheter. Check flow vial was placed on the back and of the inner dilator. Ultrasound survey was then performed of the right neck, with images stored and sent to PACs. Ultrasound guidance was then used to access the right internal jugular vein with a micro puncture kit. The wire was advanced under fluoroscopy into the right atrium, and a small incision was made. The needle was removed and the dilator was placed. The micro wire in the stiffener were removed and an 035 wire was then passed into the inferior vena cava. Ten French soft tissue dilation was performed on the wire, and then 10 [place] TIPS sheath was placed. Combination of Benson wire and multipurpose angled catheter were used to select several right hepatic vein. Initially we elected the largest and most caudal oriented hepatic vein for  attempted trans a Paddock tract. Sheath was then advanced over the catheter with a coaxial wire, for a position cephalad to the transition point of the wire in the portal venous system. Simultaneous venogram of the portal system and the hepatic venous system was performed both in a frontal projection and a right anterior oblique projection. The Colapinto needle was then advanced through the TIPS sheath housed in the Teflon sheath over the Amplatz wire. Using frontal and oblique projections, approximately 3 passes were attempted to access the portal venous system. Given the orientation of the portal system and the hepatic vein, we then withdrew the colapinto needle in attempt to find a more cephalad hepatic vein for access. Two additional hepatic veins were selected. Neither of these was large enough or with an orientation that would allow passage of the tips sheath. Thus, we decided to return to the first and largest of the hepatic veins for additional attempts for trans a Paddock tract to the right portal system. Multiple attempts at passing the colapinto needle were unsuccessful in entering the portal system. Ultimately we decided to withdraw at 32 minutes of fluoro time, having not identified an adequate angle from hepatic vein to portal vein tract. All catheters and wires were removed with manual pressure for hemostasis. Patient tolerated the procedure well and remained hemodynamically stable throughout. No  complications were encountered and no significant blood loss. IMPRESSION: Status post ultrasound-guided paracentesis, ultrasound-guided right IJ access, ultrasound-guided percutaneous transhepatic portal access, for TIPS attempt, which was ultimately unsuccessful. Signed, Dulcy Fanny. Dellia Nims, RPVI [facility] Radiology PLAN: The patient will be observed overnight after recovery in the PACU. Patient will follow-up in [place] clinic, and we will discuss noncontrast  MR or CT pending another attempt for TIPS Continue large volume paracentesis as needed Electronically Signed   By: Corrie Mckusick D.O.   On: 11/25/2019 11:17   IR Paracentesis  Result Date: 11/25/2019 CLINICAL DATA:  59 year old female with a history of portal hypertension, refractory ascites, presenting for tips EXAM: ULTRASOUND-GUIDED PARACENTESIS ABORTED TIPS ULTRASOUND-GUIDED ACCESS RIGHT INTERNAL JUGULAR VEIN MEDICATIONS: Clindamycin antibiotics ANESTHESIA/SEDATION: General - as administered by the Anesthesia department The patient was continuously monitored during the procedure by the interventional radiology nurse under my direct supervision. CONTRAST:  37m OMNIPAQUE IOHEXOL 300 MG/ML SOLN, 442mOMNIPAQUE IOHEXOL 300 MG/ML SOLN FLUOROSCOPY TIME:  Fluoroscopy Time: 31 minutes 6 seconds (602 mGy). COMPLICATIONS: None PROCEDURE: Informed written consent was obtained from the patient's family after a thorough discussion of the procedural risks, benefits and alternatives. Specific risks discussed with TIPS/variceal embolization included: Bleeding, infection, vascular injury, need for further procedure/surgery, renal injury/renal failure, contrast reaction, non-target embolization, liver dysfunction/failure, hepatic encephalopathy, stroke (~1%), cardiopulmonary collapse, death. All questions were addressed. Maximal Sterile Barrier Technique was utilized including caps, mask, sterile gowns, sterile gloves, sterile drape, hand hygiene and skin antiseptic. A timeout was performed prior to the initiation of the procedure. Patient was positioned supine position on the table, with the right upper quadrant and the right neck prepped and draped in the usual sterile fashion. Ultrasound survey of the right upper quadrant was performed, with images stored and sent to PACs. Ultrasound images of the abdomen performed 2 identifies safe site for paracentesis. A small stab incision was made, and then a Safe-T-Centesis kit was  advanced into the abdomen. We initiated large volume paracentesis with vacuo-tainer. Using ultrasound guidance, Chiba needle was used access the right portal system. Once we confirmed position in the portal system with portal venography, micro wire was advanced into the inferior mesenteric vein. The inner dilator was advanced over the metal stiffener into the portal system, with the inner dilator advanced over the wire. Venogram confirmed are location within the portal system. The micro wire was then replaced into the inner dilator, with the transition point position at the apex of the catheter. Check flow vial was placed on the back and of the inner dilator. Ultrasound survey was then performed of the right neck, with images stored and sent to PACs. Ultrasound guidance was then used to access the right internal jugular vein with a micro puncture kit. The wire was advanced under fluoroscopy into the right atrium, and a small incision was made. The needle was removed and the dilator was placed. The micro wire in the stiffener were removed and an 035 wire was then passed into the inferior vena cava. Ten French soft tissue dilation was performed on the wire, and then 10 [place] sheath was placed. Combination of Benson wire and multipurpose angled catheter were used to select several right hepatic vein. Initially we elected the largest and most caudal oriented hepatic vein for attempted trans a Paddock tract. Sheath was then advanced over the catheter with a coaxial wire, for a position cephalad to the transition point of the wire in the portal venous system.  Simultaneous venogram of the portal system and the hepatic venous system was performed both in a frontal projection and a right anterior oblique projection. The Colapinto needle was then advanced through the TIPS sheath housed in the Teflon sheath over the Amplatz wire. Using frontal and oblique projections, approximately 3 passes were attempted to access the  portal venous system. Given the orientation of the portal system and the hepatic vein, we then withdrew the colapinto needle in attempt to find a more cephalad hepatic vein for access. Two additional hepatic veins were selected. Neither of these was large enough or with an orientation that would allow passage of the tips sheath. Thus, we decided to return to the first and largest of the hepatic veins for additional attempts for trans a Paddock tract to the right portal system. Multiple attempts at passing the colapinto needle were unsuccessful in entering the portal system. Ultimately we decided to withdraw at 32 minutes of fluoro time, having not identified an adequate angle from hepatic vein to portal vein tract. All catheters and wires were removed with manual pressure for hemostasis. Patient tolerated the procedure well and remained hemodynamically stable throughout. No complications were encountered and no significant blood loss. IMPRESSION: Status post ultrasound-guided paracentesis, ultrasound-guided right IJ access, ultrasound-guided percutaneous transhepatic portal access, for TIPS attempt, which was ultimately unsuccessful. Signed, Dulcy Fanny. Dellia Nims, RPVI [facility] Radiology PLAN: The patient will be observed overnight after recovery in the PACU. Patient will follow-up in [place] clinic, and we will discuss noncontrast MR or CT pending another attempt for TIPS Continue large volume paracentesis as needed Electronically Signed   By: Corrie Mckusick D.O.   On: 11/25/2019 11:17    Labs:  CBC: Recent Labs    08/17/19 0556 08/17/19 0556 08/18/19 0519 08/18/19 0519 08/20/19 0251 08/20/19 0251 09/19/19 1307 11/25/19 0630 11/25/19 0852 11/25/19 0958  WBC 4.7  --  3.5*  --  3.2*  --   --  7.7  --   --   HGB 9.6*   < > 9.1*   < > 9.1*   < > 11.2* 9.1* 7.5* 8.8*  HCT 28.5*   < > 27.1*   < > 27.2*   < > 33.0* 27.7* 22.0* 26.0*  PLT 109*  --  87*  --   83*  --   --  153  --   --    < > = values in this interval not displayed.    COAGS: Recent Labs    07/31/19 1154 08/16/19 0014 10/16/19 1720 11/25/19 0630  INR 1.4* 1.3* 1.3* 1.5*  APTT  --   --   --  35    BMP: Recent Labs    08/19/19 1314 08/19/19 1314 08/20/19 0251 08/29/19 1529 10/23/19 1616 10/23/19 1616 11/08/19 1454 11/08/19 1454 11/11/19 1646 11/11/19 1646 11/19/19 1640 11/25/19 0630 11/25/19 0852 11/25/19 0958  NA 131*   < > 133*   < > 130*   < > 124*   < > 125*   < > 123* 130* 132* 131*  K 4.7   < > 4.9   < > 4.6   < > 5.5 No hemolysis seen*   < > 5.2 No hemolysis seen*   < > 5.2* 4.8 4.8 5.3*  CL 102   < > 104   < > 102   < > 99  --  99  --  98 104  --   --   CO2  19*   < > 22   < > 19*   < > 18*  --  18*  --  20 17*  --   --   GLUCOSE 155*   < > 102*   < > 157*   < > 117*  --  131*  --  142* 149*  --   --   BUN 20   < > 21*   < > 29*   < > 38*  --  31*  --  29* 28*  --   --   CALCIUM 8.2*   < > 8.2*   < > 9.4   < > 8.7  --  8.5  --  8.4 8.3*  --   --   CREATININE 0.91   < > 0.94   < > 1.37*   < > 1.34*  --  1.56*  --  1.38* 1.96*  --   --   GFRNONAA >60  --  >60  --  43*  --   --   --   --   --   --  28*  --   --   GFRAA >60  --  >60  --  50*  --   --   --   --   --   --  32*  --   --    < > = values in this interval not displayed.    LIVER FUNCTION TESTS: Recent Labs    08/20/19 0251 09/06/19 1428 09/16/19 1638 11/25/19 0630  BILITOT 0.8 1.4* 1.2 1.9*  AST 38 41* 50* 30  ALT 22 27 28 19   ALKPHOS 60 93 83 106  PROT 5.1* 6.9 6.3 5.2*  ALBUMIN 2.3* 3.3* 3.3* 2.4*    Assessment and Plan:  TIPS attempt Unsuccessful secondary unusual anatomy Plan  is to admit overnight -- to see Dr Earleen Newport in [facility] asap for new plan implementation DC in am if stable DC foley now Ambulate with assistance Pt is aware of plan and agreeable   Electronically Signed: Lavonia Drafts, PA-C 11/25/2019, 2:37 PM   I spent a total of 15 Minutes at the the  patient's bedside AND on the patient's hospital floor or unit, greater than 50% of which was counseling/coordinating care for TIPS attempt

## 2019-11-25 NOTE — Transfer of Care (Signed)
Immediate Anesthesia Transfer of Care Note  Patient: Meagan Peck  Procedure(s) Performed: TIPS (N/A )  Patient Location: PACU  Anesthesia Type:General  Level of Consciousness: drowsy and patient cooperative  Airway & Oxygen Therapy: Patient Spontanous Breathing and Patient connected to face mask oxygen  Post-op Assessment: Report given to RN and Post -op Vital signs reviewed and stable  Post vital signs: Reviewed and stable  Last Vitals:  Vitals Value Taken Time  BP 97/58 11/25/19 1115  Temp    Pulse 103 11/25/19 1120  Resp 18 11/25/19 1120  SpO2 100 % 11/25/19 1120  Vitals shown include unvalidated device data.  Last Pain:  Vitals:   11/25/19 0656  TempSrc:   PainSc: 0-No pain      Patients Stated Pain Goal: 3 (22/48/25 0037)  Complications: No complications documented.

## 2019-11-25 NOTE — H&P (Addendum)
Chief Complaint: Patient was seen in consultation today for TIPS/ poss variceal embolization and probable paracnetesis at the request of Dr Doyne Keel  Supervising Physician: Corrie Mckusick  Patient Status: [facility] - Out-pt  History of Present Illness: Meagan Peck is a 59 y.o. female   Refractory ascites; NASH Cirrhosis Was seen in consultation with Dr Earleen Newport 10/30/19  as per Dr Havery Moros referral  Most recent Large volume paracentesis: 7/22 7 L (limit per MD)                                                                   7/13: 10 L Started having LVP April 2021 GI exam 08/2019 revealed >5 mm esophageal varices and GOV-2 varices Banded 08/20/19 and 09/19/19 Denies encephalopathy Does use walker at home for unsteady gait  MELD 6/30:  19 MELD today: ~25  Consult note 6/30: Presenting to [facility] to discuss TIPS as an option for her refractory ascites.   I had a lengthy discussion with her regarding the pertinent anatomy, pathology/pathophysiology of liver cirrhosis, portal HTN, ascites, and varices.  I did let her know that liver transplant is the only "cure" for her cirrhosis and portal HTN, with additional management strategies for her ascites to include continued LVP, medical management, and TIPS.   I did let her know that the diagnosis of refractory ascites, given her developing renal dysfunction and limited ability for pharm management, that prognosis is generally considered poor, with citations of 50% mortality at 1 year in this scenario.   We discussed TIPS at length as a strategy for reducing ascites, with cited success rates of decreasing need for LVP 80% at 3 months.  TIPS has also been shown to recover some kidney function in this setting of hepatorenal dysfunction.   Given her labs, decreasing renal function, MELD, CP score, and good ECHO, I think she is an appropriate candidate for TIPS for ascites control.  Risks and benefits of TIPS, and/or additional variceal  embolization were discussed with Meagan Peck including: infection, bleeding, damage to adjacent structures, worsening hepatic and/or cardiac function, worsening and/or the development of altered mental status/encephalopathy, need for lactulose, need for additional procedure/surgery, TIPS reversal, non-target embolization, cardiopulmonary collapse, death.  After our discussion, she would like to proceed with TIPS.  Plan: - We will plan for TIPS, possible variceal embolization, with general anesthesia, at [facility], with Dr. Earleen Newport.  We will also perform paracentesis at the time of TIPS, as needed.  - Given her creatine, we will need to perform a hepatic duplex examination as a planning study instead of contrast CT imaging.    Scheduled today for IR procedure    Past Medical History:  Diagnosis Date  . Acute medial meniscus tear of right knee 12/10/2015  . Allergy   . Arthritis   . Ascites   . Cataract    bilateral - surgery to remove  . Cirrhosis ([place])   . Diabetes mellitus without complication ([name])    type 2 - last a1c was 5.2 per patient, no meds  . Esophageal varices ([place])   . GERD (gastroesophageal reflux disease)   . Hepatic cirrhosis ([place])   . HLD (hyperlipidemia)    no meds  . Hypertension    no meds  .  Iron deficiency anemia 09/26/2018    Past Surgical History:  Procedure Laterality Date  . BIOPSY  08/20/2019   Procedure: BIOPSY;  Surgeon: Jackquline Denmark, MD;  Location: [place];  Service: Gastroenterology;;  . CARPAL TUNNEL RELEASE Right   . CESAREAN SECTION    . CHOLECYSTECTOMY    . COLONOSCOPY  08/2019  . ESOPHAGEAL BANDING N/A 09/19/2019   Procedure: ESOPHAGEAL BANDING;  Surgeon: Yetta Flock, MD;  Location: [facility];  Service: Gastroenterology;  Laterality: N/A;  . ESOPHAGOGASTRODUODENOSCOPY (EGD) WITH PROPOFOL N/A 08/20/2019   Procedure: ESOPHAGOGASTRODUODENOSCOPY (EGD) WITH PROPOFOL;  Surgeon: Jackquline Denmark, MD;  Location: [place];  Service:  Gastroenterology;  Laterality: N/A;  . ESOPHAGOGASTRODUODENOSCOPY (EGD) WITH PROPOFOL N/A 09/19/2019   Procedure: ESOPHAGOGASTRODUODENOSCOPY (EGD) WITH PROPOFOL;  Surgeon: Yetta Flock, MD;  Location: [facility];  Service: Gastroenterology;  Laterality: N/A;  . EYE SURGERY Bilateral    removed cataracts  . GASTRIC VARICES BANDING N/A 08/20/2019   Procedure: GASTRIC VARICES BANDING;  Surgeon: Jackquline Denmark, MD;  Location: [place];  Service: Gastroenterology;  Laterality: N/A;  . IR PARACENTESIS  08/01/2019  . IR PARACENTESIS  08/19/2019  . IR PARACENTESIS  09/09/2019  . IR PARACENTESIS  09/23/2019  . IR PARACENTESIS  10/08/2019  . IR PARACENTESIS  10/23/2019  . IR PARACENTESIS  11/12/2019  . IR PARACENTESIS  11/21/2019  . IR RADIOLOGIST EVAL & MGMT  10/30/2019  . KNEE ARTHROSCOPY Right 12/10/2015   Procedure: RIGHT KNEE ARTHROSCOPY WITH PARTIAL MEDIAL MENISCECTOMY;  Surgeon: Mcarthur Rossetti, MD;  Location: [facility];  Service: Orthopedics;  Laterality: Right;  . KNEE ARTHROSCOPY W/ MENISCAL REPAIR Bilateral   . MENISCUS REPAIR Left     Allergies: Penicillins, Atrovent [ipratropium], Naproxen, Quinine derivatives, and Sulfa antibiotics  Medications: Prior to Admission medications   Medication Sig Start Date End Date Taking? Authorizing Provider  ACCU-CHEK FASTCLIX LANCETS MISC Check fsbs TID 08/09/16   [provider]  aspirin 81 MG tablet Take 1 tablet (81 mg total) by mouth daily. Patient taking differently: Take 81 mg by mouth at bedtime.  08/30/19   Cherylann Ratel A, DO  cetirizine (ZYRTEC) 10 MG tablet Take 1 tablet (10 mg total) by mouth daily. Patient taking differently: Take 10 mg by mouth daily as needed.  01/02/19   [name], DO  Cholecalciferol (VITAMIN D3) 5000 UNITS TABS Take 5,000 Units by mouth daily.     [provider]  doxycycline (VIBRAMYCIN) 100 MG capsule Take one cap PO Q12hr with food. 10/22/19   Kandra Nicolas, MD  escitalopram  (LEXAPRO) 10 MG tablet Take 0.5 tablets (5 mg total) by mouth daily. 10/30/19   [name], DO  ferrous sulfate 325 (65 FE) MG EC tablet Take 1 tablet (325 mg total) by mouth 2 (two) times daily with a meal. 09/26/18   [name], DO  Krill Oil 500 MG CAPS Take 500 mg by mouth daily.    [provider]  magnesium oxide (MAG-OX) 400 MG tablet Take 1 tablet (400 mg total) by mouth 2 (two) times daily. 01/02/19   [name], DO  mupirocin ointment (BACTROBAN) 2 % Apply 1 application topically 3 (three) times daily. 10/22/19   Kandra Nicolas, MD     Family History  Problem Relation Age of Onset  . Hypertension Mother   . COPD Mother   . Alcoholism Father   . Heart attack Father   . Diabetes Brother   . Diabetes Paternal Grandmother   .  Heart disease Paternal Grandmother   . Alcoholism Paternal Uncle   . Alcoholism Maternal Uncle   . Heart disease Maternal Grandmother   . Heart disease Maternal Grandfather   . Heart disease Paternal Grandfather     Social History   Socioeconomic History  . Marital status: Married    Spouse name: Meagan Peck  . Number of children: 1  . Years of education: Not on file  . Highest education level: Associate degree: academic program  Occupational History  . Not on file  Tobacco Use  . Smoking status: Never Smoker  . Smokeless tobacco: Never Used  Vaping Use  . Vaping Use: Never used  Substance and Sexual Activity  . Alcohol use: No  . Drug use: No  . Sexual activity: Yes    Partners: Male    Birth control/protection: None, Post-menopausal  Other Topics Concern  . Not on file  Social History Narrative  . Not on file   Social Determinants of Health   Financial Resource Strain:   . Difficulty of Paying Living Expenses:   Food Insecurity:   . Worried About Charity fundraiser in the Last Year:   . Arboriculturist in the Last Year:   Transportation Needs:   . Film/video editor (Medical):   Marland Kitchen Lack of  Transportation (Non-Medical):   Physical Activity:   . Days of Exercise per Week:   . Minutes of Exercise per Session:   Stress:   . Feeling of Stress :   Social Connections:   . Frequency of Communication with Friends and Family:   . Frequency of Social Gatherings with Friends and Family:   . Attends Religious Services:   . Active Member of Clubs or Organizations:   . Attends Archivist Meetings:   Marland Kitchen Marital Status:      Review of Systems: A 12 point ROS discussed and pertinent positives are indicated in the HPI above.  All other systems are negative.  Review of Systems  Constitutional: Positive for activity change, appetite change and fatigue. Negative for fever.  Respiratory: Positive for shortness of breath. Negative for cough.   Cardiovascular: Negative for chest pain.  Gastrointestinal: Positive for abdominal distention, abdominal pain and nausea.  Musculoskeletal: Positive for gait problem.  Neurological: Positive for weakness.  Psychiatric/Behavioral: Negative for behavioral problems and confusion.    Vital Signs: 128/58; 98.5; HR 100; R 18; 100 % RA  Physical Exam Vitals reviewed.  HENT:     Mouth/Throat:     Mouth: Mucous membranes are moist.  Cardiovascular:     Rate and Rhythm: Normal rate and regular rhythm.     Heart sounds: Murmur heard.   Pulmonary:     Effort: Pulmonary effort is normal.     Breath sounds: No wheezing.  Abdominal:     General: There is distension.     Palpations: Abdomen is soft.     Tenderness: There is no abdominal tenderness.  Musculoskeletal:        General: Normal range of motion.  Skin:    General: Skin is warm.  Neurological:     Mental Status: She is oriented to person, place, and time.  Psychiatric:        Behavior: Behavior normal.     Imaging: US LIVER DOPPLER  Result Date: 11/12/2019 CLINICAL DATA:  59 year old female with a history of liver disease EXAM: DUPLEX ULTRASOUND OF LIVER TECHNIQUE: Color  and duplex Doppler ultrasound was performed to evaluate  the hepatic in-flow and out-flow vessels. COMPARISON:  None. FINDINGS: Portal Vein Velocities Main:  25 cm/sec Right:  23 cm/sec Left:  13 cm/sec Hepatic Vein Velocities Right:  95 cm/sec Middle:  101 cm/sec Left:  58 cm/sec Hepatic Artery Velocity:  125 cm/sec Splenic Vein Velocity:  22 cm/sec Varices: None visualized Ascites: Ascites present Splenic volume estimated 471 cc Nodular changes of the liver compatible with cirrhosis IMPRESSION: Duplex of the hepatic vascular demonstrates patent portal vein. Signed, Dulcy Fanny. Dellia Nims, RPVI [facility] Radiology Electronically Signed   By: Corrie Mckusick D.O.   On: 11/12/2019 15:56   IR Radiologist Eval & Mgmt  Result Date: 10/30/2019 Please refer to notes tab for details about interventional procedure. (Op Note)  IR Paracentesis  Result Date: 11/21/2019 INDICATION: Recurrent large volume ascites secondary to hepatic cirrhosis. Request for therapeutic paracentesis up to 7 L. EXAM: ULTRASOUND GUIDED PARACENTESIS MEDICATIONS: 1% lidocaine 10 mL COMPLICATIONS: None immediate. PROCEDURE: Informed written consent was obtained from the patient after a discussion of the risks, benefits and alternatives to treatment. A timeout was performed prior to the initiation of the procedure. Initial ultrasound scanning demonstrates a large amount of ascites within the right lower abdominal quadrant. The right lower abdomen was prepped and draped in the usual sterile fashion. 1% lidocaine was used for local anesthesia. Following this, a 19 gauge, 7-cm, Yueh catheter was introduced. An ultrasound image was saved for documentation purposes. The paracentesis was performed. The catheter was removed and a dressing was applied. The patient tolerated the procedure well without immediate post procedural complication. FINDINGS: A total of approximately 7 L of clear yellow fluid was  removed. IMPRESSION: Successful ultrasound-guided paracentesis yielding 7 liters of peritoneal fluid. Read by: Gareth Eagle, PA-C Electronically Signed   By: Corrie Mckusick D.O.   On: 11/21/2019 11:21   IR Paracentesis  Result Date: 11/12/2019 INDICATION: Patient with history of cirrhosis, recurrent ascites. Request to IR for therapeutic paracentesis 10 L maximum. EXAM: ULTRASOUND GUIDED THERAPEUTIC PARACENTESIS MEDICATIONS: 8 mL 1% lidocaine COMPLICATIONS: None immediate. PROCEDURE: Informed written consent was obtained from the patient after a discussion of the risks, benefits and alternatives to treatment. A timeout was performed prior to the initiation of the procedure. Initial ultrasound scanning demonstrates a large amount of ascites within the right lower abdominal quadrant. The right lower abdomen was prepped and draped in the usual sterile fashion. 1% lidocaine was used for local anesthesia. Following this, a 19 gauge, 7-cm, Yueh catheter was introduced. An ultrasound image was saved for documentation purposes. The paracentesis was performed. The catheter was removed and a dressing was applied. The patient tolerated the procedure well without immediate post procedural complication. Patient received post-procedure intravenous albumin; see nursing notes for details. FINDINGS: A total of approximately 10.0 L of clear yellow fluid was removed. IMPRESSION: Successful ultrasound-guided paracentesis yielding 10 liters of peritoneal fluid. Read by Candiss Norse, PA-C Electronically Signed   By: Corrie Mckusick D.O.   On: 11/12/2019 09:57    Labs:  CBC: Recent Labs    08/17/19 0556 08/17/19 0556 08/18/19 0519 08/20/19 0251 09/19/19 1307 11/25/19 0630  WBC 4.7  --  3.5* 3.2*  --  7.7  HGB 9.6*   < > 9.1* 9.1* 11.2* 9.1*  HCT 28.5*   < > 27.1* 27.2* 33.0* 27.7*  PLT 109*  --  87* 83*  --  153   < > = values in this interval not displayed.    COAGS:  Recent Labs    07/31/19 1154  08/16/19 0014 10/16/19 1720 11/25/19 0630  INR 1.4* 1.3* 1.3* 1.5*  APTT  --   --   --  35    BMP: Recent Labs    08/18/19 0519 08/18/19 0519 08/19/19 1314 08/19/19 1314 08/20/19 0251 08/29/19 1529 10/23/19 1616 11/08/19 1454 11/11/19 1646 11/19/19 1640  NA 133*   < > 131*   < > 133*   < > 130* 124* 125* 123*  K 4.5   < > 4.7   < > 4.9   < > 4.6 5.5 No hemolysis seen* 5.2 No hemolysis seen* 5.2*  CL 107   < > 102   < > 104   < > 102 99 99 98  CO2 18*   < > 19*   < > 22   < > 19* 18* 18* 20  GLUCOSE 76   < > 155*   < > 102*   < > 157* 117* 131* 142*  BUN 24*   < > 20   < > 21*   < > 29* 38* 31* 29*  CALCIUM 8.3*   < > 8.2*   < > 8.2*   < > 9.4 8.7 8.5 8.4  CREATININE 0.93   < > 0.91   < > 0.94   < > 1.37* 1.34* 1.56* 1.38*  GFRNONAA >60  --  >60  --  >60  --  43*  --   --   --   GFRAA >60  --  >60  --  >60  --  50*  --   --   --    < > = values in this interval not displayed.    LIVER FUNCTION TESTS: Recent Labs    08/18/19 0519 08/18/19 0519 08/19/19 1314 08/20/19 0251 09/06/19 1428 09/16/19 1638  BILITOT 1.1  --   --  0.8 1.4* 1.2  AST 28  --   --  38 41* 50*  ALT 15  --   --  22 27 28   ALKPHOS 51  --   --  60 93 83  PROT 5.3*  --   --  5.1* 6.9 6.3  ALBUMIN 2.4*   < > 2.5* 2.3* 3.3* 3.3*   < > = values in this interval not displayed.    TUMOR MARKERS: Recent Labs    07/31/19 1154  AFPTM 2.3    Assessment and Plan:  NASH Cirrhosis Refractory ascites Requiring multiple LVP Consultation with Dr Earleen Newport re TIPS procedure/additional variceal embolization and possible paracentesis Now scheduled for same Risks and benefits of TIPS, BRTO and/or additional variceal embolization were discussed with the patient and/or the patient's family including, but not limited to, infection, bleeding, damage to adjacent structures, worsening hepatic and/or cardiac function, worsening and/or the development of altered mental status/encephalopathy, non-target embolization  and death.   This interventional procedure involves the use of X-rays and because of the nature of the planned procedure, it is possible that we will have prolonged use of X-ray fluoroscopy.  Potential radiation risks to you include (but are not limited to) the following: - A slightly elevated risk for cancer  several years later in life. This risk is typically less than 0.5% percent. This risk is low in comparison to the normal incidence of human cancer, which is 33% for women and 50% for men according to the [place]. - Radiation induced injury can include skin redness, resembling a rash, tissue breakdown /  ulcers and hair loss (which can be temporary or permanent).   The likelihood of either of these occurring depends on the difficulty of the procedure and whether you are sensitive to radiation due to previous procedures, disease, or genetic conditions.   IF your procedure requires a prolonged use of radiation, you will be notified and given written instructions for further action.  It is your responsibility to monitor the irradiated area for the 2 weeks following the procedure and to notify your physician if you are concerned that you have suffered a radiation induced injury.    All of the patient's questions were answered, patient is agreeable to proceed.  Consent signed and in chart.   Thank you for this interesting consult.  I greatly enjoyed meeting Malaina P Crabbe and look forward to participating in their care.  A copy of this report was sent to the requesting provider on this date.  Electronically Signed: Lavonia Drafts, PA-C 11/25/2019, 7:09 AM   I spent a total of    25 Minutes in face to face in clinical consultation, greater than 50% of which was counseling/coordinating care for TIPS/ variceal embo and paracentesis

## 2019-11-25 NOTE — H&P (Deleted)
  The note originally documented on this encounter has been moved the the encounter in which it belongs.  

## 2019-11-25 NOTE — Progress Notes (Signed)
Right IJ neck dsg and right chest dsg (both gauze & transparent dsg), CDI on adm to PACU.

## 2019-11-25 NOTE — Sedation Documentation (Signed)
Procedure complete.

## 2019-11-26 ENCOUNTER — Encounter (HOSPITAL_COMMUNITY): Payer: Self-pay | Admitting: Interventional Radiology

## 2019-11-26 DIAGNOSIS — K7581 Nonalcoholic steatohepatitis (NASH): Secondary | ICD-10-CM | POA: Diagnosis not present

## 2019-11-26 NOTE — Progress Notes (Signed)
Ayahna P Calip to be D/C'd  per MD order. Discussed with the patient and all questions fully answered.  VSS, Skin clean, dry and intact without evidence of skin break down, no evidence of skin tears noted.  IV catheter discontinued intact. Site without signs and symptoms of complications. Dressing and pressure applied.  An After Visit Summary was printed and given to the patient. Patient received prescription.  D/c education completed with patient/family including follow up instructions, medication list, d/c activities limitations if indicated, with other d/c instructions as indicated by MD - patient able to verbalize understanding, all questions fully answered.   Patient instructed to return to ED, call 911, or call MD for any changes in condition.   Patient to be escorted via [place], and D/C home via private auto.

## 2019-11-26 NOTE — Plan of Care (Signed)

## 2019-11-26 NOTE — Discharge Summary (Signed)
Patient ID: Meagan Peck MRN: [id number] DOB/AGE: August 24, 1960 59 y.o.  Admit date: 11/25/2019 Discharge date: 11/26/2019  Supervising Physician: Corrie Mckusick  Patient Status: [facility] - In-pt  Admission Diagnoses: Refractory ascites                                         NASH Cirrhosis  Discharge Diagnoses:  Active Problems:   History of portal hypertension   Discharged Condition: stable  Hospital Course: Transjugular intrahepatic portal system shunt placement attempted in IR yesterday with Dr Earleen Newport. TIPS not completed secondary unusual anatomy and procedure was stopped. Paracentesis was performed and 7 Liters yellow fluid was removed Overnight stay was uneventful. Rested well; eating well; denies N/V Has urinated on own. Passing gas Has no complaints overnight Al sites of procedure are clean and dry and NT; no bleeding or hematomas. I have seen and examined pt-- discussed with Dr Earleen Newport pt status. She is ready for discharge home.  Consults: None  Significant Diagnostic Studies:  Interventional Radiology Procedure Note Procedure:  Attempted TIPS, failed given the anatomy.  US guided paracentesis US guided right IJ puncture Complications: None  Treatments: intra procedure paracentesis: 7 L ascitic fluid  Discharge Exam: Blood pressure (!) 106/60, pulse 74, temperature 98.4 F (36.9 C), temperature source Oral, resp. rate 16, height 4\' 10"  (1.473 m), weight 147 lb (66.7 kg), last menstrual period 12/03/2013, SpO2 100 %.  A/O Pleasant In NAD Rt neck site is clean and dry; NT No bleeding No hematoma Heart RRR Lungs CTA Abd; mildly distended; NT No masses Rt abd sites are clean and dry NT no bleeding; no hematoma Extr FROM- ambulating well UOP great- yellow clear  Results for orders placed or performed during the hospital encounter of 11/25/19  APTT  Result Value Ref Range   aPTT 35 24 - 36 seconds  CBC with Differential/Platelet  Result Value Ref Range    WBC 7.7 4.0 - 10.5 K/uL   RBC 3.07 (L) 3.87 - 5.11 MIL/uL   Hemoglobin 9.1 (L) 12.0 - 15.0 g/dL   HCT 27.7 (L) 36 - 46 %   MCV 90.2 80.0 - 100.0 fL   MCH 29.6 26.0 - 34.0 pg   MCHC 32.9 30.0 - 36.0 g/dL   RDW 15.5 11.5 - 15.5 %   Platelets 153 150 - 400 K/uL   nRBC 0.0 0.0 - 0.2 %   Neutrophils Relative % 69 %   Neutro Abs 5.4 1.7 - 7.7 K/uL   Lymphocytes Relative 14 %   Lymphs Abs 1.1 0.7 - 4.0 K/uL   Monocytes Relative 13 %   Monocytes Absolute 1.0 0 - 1 K/uL   Eosinophils Relative 2 %   Eosinophils Absolute 0.1 0 - 0 K/uL   Basophils Relative 1 %   Basophils Absolute 0.1 0 - 0 K/uL   Immature Granulocytes 1 %   Abs Immature Granulocytes 0.05 0.00 - 0.07 K/uL  Comprehensive metabolic panel  Result Value Ref Range   Sodium 130 (L) 135 - 145 mmol/L   Potassium 4.8 3.5 - 5.1 mmol/L   Chloride 104 98 - 111 mmol/L   CO2 17 (L) 22 - 32 mmol/L   Glucose, Bld 149 (H) 70 - 99 mg/dL   BUN 28 (H) 6 - 20 mg/dL   Creatinine, Ser 1.96 (H) 0.44 - 1.00 mg/dL   Calcium 8.3 (L) 8.9 - 10.3 mg/dL  Total Protein 5.2 (L) 6.5 - 8.1 g/dL   Albumin 2.4 (L) 3.5 - 5.0 g/dL   AST 30 15 - 41 U/L   ALT 19 0 - 44 U/L   Alkaline Phosphatase 106 38 - 126 U/L   Total Bilirubin 1.9 (H) 0.3 - 1.2 mg/dL   GFR calc non Af Amer 28 (L) >60 mL/min   GFR calc Af Amer 32 (L) >60 mL/min   Anion gap 9 5 - 15  Protime-INR  Result Value Ref Range   Prothrombin Time 17.4 (H) 11.4 - 15.2 seconds   INR 1.5 (H) 0.8 - 1.2  Glucose, capillary  Result Value Ref Range   Glucose-Capillary 156 (H) 70 - 99 mg/dL  Type and screen  Result Value Ref Range   ABO/RH(D) A POS    Antibody Screen NEG    Sample Expiration 11/28/2019,2359    Unit Number [id number]    Blood Component Type RED CELLS,LR    Unit division 00    Status of Unit ISSUED,FINAL    Transfusion Status OK TO TRANSFUSE    Crossmatch Result      Compatible Performed at [facility], [address], [place], [place] [address]    Unit Number  [id number]    Blood Component Type RED CELLS,LR    Unit division 00    Status of Unit ALLOCATED    Transfusion Status OK TO TRANSFUSE    Crossmatch Result Compatible    Unit Number [id number]    Blood Component Type RED CELLS,LR    Unit division 00    Status of Unit ALLOCATED    Transfusion Status OK TO TRANSFUSE    Crossmatch Result Compatible    Unit Number [id number]    Blood Component Type RED CELLS,LR    Unit division 00    Status of Unit ALLOCATED    Transfusion Status OK TO TRANSFUSE    Crossmatch Result Compatible   ABO/Rh  Result Value Ref Range   ABO/RH(D)      A POS Performed at [place] [address], [place], [place] [address]   Prepare RBC (crossmatch)  Result Value Ref Range   Order Confirmation      ORDER PROCESSED BY BLOOD BANK Performed at [facility], [address] [place], [place] [address]   [facility]  Result Value Ref Range   ISSUE DATE / TIME 956387564332    Blood Product Unit Number [id number]    PRODUCT CODE [id number]    Unit Type and Rh 6200    Blood Product Expiration Date 355732202542    ISSUE DATE / TIME 706237628315    Blood Product Unit Number [id number]    PRODUCT CODE [id number]    Unit Type and Rh 6200    Blood Product Expiration Date 703500938182    ISSUE DATE / TIME [id number]    Blood Product Unit Number [id number]    PRODUCT CODE [id number]    Unit Type and Rh 6200    Blood Product Expiration Date 361443154008    ISSUE DATE / TIME 676195093267    Blood Product Unit Number [id number]    PRODUCT CODE [id number]    Unit Type and Rh 6200    Blood Product Expiration Date 341937902409     Disposition:  TIPS attempt in IR with Dr Earleen Newport Unusual anatomy and TIPS was not completed Overnight stay uneventful Now ready for Discharge home Resume all home meds Pt will hear from IR scheduler for appt  date and time with Dr Earleen Newport for follow up She has good understanding of all DC instructions     Discharge Instructions    Call MD for:  difficulty breathing, headache or visual disturbances   Complete by: As directed    Call MD for:  extreme fatigue   Complete by: As directed    Call MD for:  hives   Complete by: As directed    Call MD for:  persistant dizziness or light-headedness   Complete by: As directed    Call MD for:  persistant nausea and vomiting   Complete by: As directed    Call MD for:  redness, tenderness, or signs of infection (pain, swelling, redness, odor or green/yellow discharge around incision site)   Complete by: As directed    Call MD for:  severe uncontrolled pain   Complete by: As directed    Call MD for:  temperature >100.4   Complete by: As directed    Diet - low sodium heart healthy   Complete by: As directed    Discharge instructions   Complete by: As directed    Resume all home meds; restful at home today; pt will hear from IR scheduler for follow up with Dr Earleen Newport--- call [phone] if any questions   Discharge wound care:   Complete by: As directed    May shower this pm or tomorrow-- keep clean bandaid on Rt neck and Rt abd sites for 1 week   Driving Restrictions   Complete by: As directed    No driving x 3 days   Increase activity slowly   Complete by: As directed    Lifting restrictions   Complete by: As directed    No lifting over 10 lbs x 3 days     Allergies as of 11/26/2019      Reactions   Penicillins Anaphylaxis   Happened as a young child   Atrovent [ipratropium] Palpitations   As per patient   Naproxen Rash   Quinine Derivatives Rash   Sulfa Antibiotics Rash      Medication List    STOP taking these medications   doxycycline 100 MG capsule Commonly known as: VIBRAMYCIN   escitalopram 10 MG tablet Commonly known as: LEXAPRO     TAKE these medications   Accu-Chek FastClix Lancets Misc Check fsbs TID   aspirin 81 MG tablet Take 1 tablet (81 mg total) by mouth daily. What changed: when to take this    cetirizine 10 MG tablet Commonly known as: ZYRTEC Take 1 tablet (10 mg total) by mouth daily. What changed:   when to take this  reasons to take this   ferrous sulfate 325 (65 FE) MG EC tablet Take 1 tablet (325 mg total) by mouth 2 (two) times daily with a meal.   Krill Oil 500 MG Caps Take 500 mg by mouth daily.   magnesium oxide 400 MG tablet Commonly known as: MAG-OX Take 1 tablet (400 mg total) by mouth 2 (two) times daily.   mupirocin ointment 2 % Commonly known as: Bactroban Apply 1 application topically 3 (three) times daily. What changed: when to take this   Vitamin D3 125 MCG (5000 UT) Tabs Take 5,000 Units by mouth daily.            Discharge Care Instructions  (From admission, onward)         Start     Ordered   11/26/19 0000  Discharge wound care:  Comments: May shower this pm or tomorrow-- keep clean bandaid on Rt neck and Rt abd sites for 1 week   11/26/19 1024          Follow-up Information    Corrie Mckusick, DO Follow up in 1 week(s).   Specialties: Interventional Radiology, Radiology Why: pt will hear from IR scheduler for follow up date and time asap-- call [phone] if any questions Contact information: [address] [place] [address] [phone]                Electronically Signed: Lavonia Drafts, PA-C 11/26/2019, 10:28 AM   I have spent Greater Than 30 Minutes discharging [place].

## 2019-11-26 NOTE — Anesthesia Postprocedure Evaluation (Signed)
Anesthesia Post Note  Patient: Meagan Peck  Procedure(s) Performed: TIPS (N/A )     Patient location during evaluation: PACU Anesthesia Type: General Level of consciousness: awake and alert Pain management: pain level controlled Vital Signs Assessment: post-procedure vital signs reviewed and stable Respiratory status: spontaneous breathing, nonlabored ventilation, respiratory function stable and patient connected to nasal cannula oxygen Cardiovascular status: blood pressure returned to baseline and stable Postop Assessment: no apparent nausea or vomiting Anesthetic complications: no   No complications documented.  Last Vitals:  Vitals:   11/26/19 0142 11/26/19 0444  BP: (!) 102/60 (!) 106/60  Pulse: 80 74  Resp: 16   Temp: 36.6 C 36.9 C  SpO2: 100% 100%    Last Pain:  Vitals:   11/26/19 0444  TempSrc: Oral  PainSc:                  Najib Colmenares

## 2019-11-26 NOTE — Plan of Care (Signed)

## 2019-11-27 ENCOUNTER — Other Ambulatory Visit: Payer: Self-pay | Admitting: Interventional Radiology

## 2019-11-27 DIAGNOSIS — K7460 Unspecified cirrhosis of liver: Secondary | ICD-10-CM

## 2019-11-27 LAB — TYPE AND SCREEN
ABO/RH(D): A POS
Antibody Screen: NEGATIVE
Unit division: 0
Unit division: 0
Unit division: 0
Unit division: 0

## 2019-11-27 LAB — BPAM RBC
Blood Product Expiration Date: 202108212359
Blood Product Expiration Date: 202108212359
Blood Product Expiration Date: 202108212359
Blood Product Expiration Date: 202108212359
ISSUE DATE / TIME: 202107260821
ISSUE DATE / TIME: 202107260821
ISSUE DATE / TIME: 202107260821
ISSUE DATE / TIME: 202107260821
Unit Type and Rh: 6200
Unit Type and Rh: 6200
Unit Type and Rh: 6200
Unit Type and Rh: 6200

## 2019-11-29 MED FILL — MAGNESIUM OXIDE 400 MG TAB: 400 (240 MG | 60 days supply | Qty: 120 | Fill #3

## 2019-12-02 ENCOUNTER — Telehealth: Payer: Self-pay | Admitting: Gastroenterology

## 2019-12-02 DIAGNOSIS — K7460 Unspecified cirrhosis of liver: Secondary | ICD-10-CM

## 2019-12-02 DIAGNOSIS — R188 Other ascites: Secondary | ICD-10-CM

## 2019-12-02 DIAGNOSIS — Z23 Encounter for immunization: Secondary | ICD-10-CM

## 2019-12-02 NOTE — Telephone Encounter (Signed)
Pt is requesting a call back to be scheduled for a Paracentesis.

## 2019-12-02 NOTE — Telephone Encounter (Signed)
Patient had failed TIPS.  Requesting paracentesis.  Okay to schedule?

## 2019-12-02 NOTE — Telephone Encounter (Signed)
Yes okay to schedule large volume paracentesis, please give albumin if > 5 L removed. She should have a BMET drawn next week Radiology is doing a liver MRI and may try a different technique for TIPS, MRI next week. Thanks

## 2019-12-03 NOTE — Telephone Encounter (Signed)
Patient notified of appt details and to come for labs next week.

## 2019-12-03 NOTE — Telephone Encounter (Signed)
Paracentesis has been scheduled at [facility] on 12/05/19 9:00 Left message for patient to call back New lab orders placed

## 2019-12-04 ENCOUNTER — Encounter: Payer: Self-pay | Admitting: Osteopathic Medicine

## 2019-12-04 DIAGNOSIS — K760 Fatty (change of) liver, not elsewhere classified: Secondary | ICD-10-CM

## 2019-12-04 DIAGNOSIS — K7460 Unspecified cirrhosis of liver: Secondary | ICD-10-CM

## 2019-12-05 ENCOUNTER — Ambulatory Visit (HOSPITAL_COMMUNITY)
Admission: RE | Admit: 2019-12-05 | Discharge: 2019-12-05 | Disposition: A | Discharge status: Home / Self Care | Payer: No Typology Code available for payment source | Source: Ambulatory Visit | Attending: Gastroenterology | Admitting: Gastroenterology

## 2019-12-05 ENCOUNTER — Other Ambulatory Visit: Payer: Self-pay

## 2019-12-05 DIAGNOSIS — R188 Other ascites: Secondary | ICD-10-CM | POA: Diagnosis not present

## 2019-12-05 DIAGNOSIS — K7460 Unspecified cirrhosis of liver: Secondary | ICD-10-CM | POA: Insufficient documentation

## 2019-12-05 HISTORY — PX: IR PARACENTESIS: IMG2679

## 2019-12-05 MED ORDER — ALBUMIN HUMAN 25 % IV SOLN
50.0000 g | Freq: Once | INTRAVENOUS | Status: AC
  Administered 2019-12-05: 50 g via INTRAVENOUS

## 2019-12-05 MED ORDER — ALBUMIN HUMAN 25 % IV SOLN
INTRAVENOUS | Status: AC
  Filled 2019-12-05: qty 100

## 2019-12-05 MED ORDER — LIDOCAINE HCL 1 % IJ SOLN
INTRAMUSCULAR | Status: DC | PRN
  Administered 2019-12-05: 10 mL

## 2019-12-05 MED ORDER — LIDOCAINE HCL 1 % IJ SOLN
INTRAMUSCULAR | Status: AC
  Filled 2019-12-05: qty 20

## 2019-12-05 MED ORDER — ALBUMIN HUMAN 25 % IV SOLN
25.0000 g | Freq: Once | INTRAVENOUS | Status: AC
  Administered 2019-12-05: 25 g via INTRAVENOUS

## 2019-12-05 MED ORDER — ALBUMIN HUMAN 25 % IV SOLN
INTRAVENOUS | Status: AC
  Filled 2019-12-05: qty 200

## 2019-12-05 NOTE — Procedures (Signed)
Ultrasound-guided  therapeutic paracentesis performed yielding 13 liters of yellow fluid. No immediate complications. The pt will receive IV albumin postprocedure. EBL none.

## 2019-12-05 NOTE — Telephone Encounter (Signed)
Liver MRI referral pended for provider review.

## 2019-12-06 ENCOUNTER — Telehealth: Payer: Self-pay

## 2019-12-06 ENCOUNTER — Telehealth: Payer: Self-pay | Admitting: Osteopathic Medicine

## 2019-12-06 DIAGNOSIS — R188 Other ascites: Secondary | ICD-10-CM

## 2019-12-06 DIAGNOSIS — K7460 Unspecified cirrhosis of liver: Secondary | ICD-10-CM

## 2019-12-06 DIAGNOSIS — I8510 Secondary esophageal varices without bleeding: Secondary | ICD-10-CM

## 2019-12-06 NOTE — Telephone Encounter (Signed)
Patient is having a procedure done on Tuesday and needs another MRI put in. The last MRI order expired 11/27/2019. Please advise

## 2019-12-06 NOTE — Telephone Encounter (Signed)
Dr. Di Kindle called again and needs the referral for her liver MRI at [facility] imaging placed she has [facility] and can not have this done without there referral to Dr. Earleen Newport for the MRI and the F/U to go over results.   Jenny Reichmann

## 2019-12-06 NOTE — Telephone Encounter (Signed)
Her surgeon should be able to order what ever type of imaging they require, PCP should not be responsible for imaging for procedure planning... Please contact surgeon's office

## 2019-12-06 NOTE — Telephone Encounter (Signed)
OK referral is in for Dr Earleen Newport Is that what [facility] needs? He should be able to order tests from there  [facility] is confusing   [name] please ignore last message, i'll addend it

## 2019-12-09 ENCOUNTER — Other Ambulatory Visit: Payer: Self-pay | Admitting: Osteopathic Medicine

## 2019-12-09 DIAGNOSIS — K7581 Nonalcoholic steatohepatitis (NASH): Secondary | ICD-10-CM

## 2019-12-09 DIAGNOSIS — K7460 Unspecified cirrhosis of liver: Secondary | ICD-10-CM

## 2019-12-10 ENCOUNTER — Other Ambulatory Visit: Payer: Self-pay

## 2019-12-10 ENCOUNTER — Ambulatory Visit (HOSPITAL_COMMUNITY)
Admission: RE | Admit: 2019-12-10 | Discharge: 2019-12-10 | Disposition: A | Discharge status: Home / Self Care | Payer: No Typology Code available for payment source | Source: Ambulatory Visit | Attending: Interventional Radiology | Admitting: Interventional Radiology

## 2019-12-10 DIAGNOSIS — R188 Other ascites: Secondary | ICD-10-CM

## 2019-12-10 DIAGNOSIS — K7460 Unspecified cirrhosis of liver: Secondary | ICD-10-CM | POA: Diagnosis present

## 2019-12-10 DIAGNOSIS — K7581 Nonalcoholic steatohepatitis (NASH): Secondary | ICD-10-CM | POA: Insufficient documentation

## 2019-12-10 MED ORDER — GADOBUTROL 1 MMOL/ML IV SOLN
6.0000 mL | Freq: Once | INTRAVENOUS | Status: AC | PRN
  Administered 2019-12-10: 6 mL via INTRAVENOUS

## 2019-12-11 ENCOUNTER — Ambulatory Visit
Admission: RE | Admit: 2019-12-11 | Discharge: 2019-12-11 | Disposition: A | Discharge status: Home / Self Care | Payer: No Typology Code available for payment source | Source: Ambulatory Visit | Attending: Radiology | Admitting: Radiology

## 2019-12-11 ENCOUNTER — Other Ambulatory Visit (INDEPENDENT_AMBULATORY_CARE_PROVIDER_SITE_OTHER): Payer: No Typology Code available for payment source

## 2019-12-11 DIAGNOSIS — R188 Other ascites: Secondary | ICD-10-CM

## 2019-12-11 DIAGNOSIS — K7460 Unspecified cirrhosis of liver: Secondary | ICD-10-CM

## 2019-12-11 HISTORY — PX: IR RADIOLOGIST EVAL & MGMT: IMG5224

## 2019-12-11 LAB — BASIC METABOLIC PANEL
BUN: 33 mg/dL — ABNORMAL HIGH (ref 6–23)
CO2: 16 mEq/L — ABNORMAL LOW (ref 19–32)
Calcium: 8.8 mg/dL (ref 8.4–10.5)
Chloride: 104 mEq/L (ref 96–112)
Creatinine, Ser: 1.49 mg/dL — ABNORMAL HIGH (ref 0.40–1.20)
GFR: 36.07 mL/min — ABNORMAL LOW (ref >60.00)
Glucose, Bld: 128 mg/dL — ABNORMAL HIGH (ref 70–99)
Potassium: 5.3 mEq/L — ABNORMAL HIGH (ref 3.5–5.1)
Sodium: 128 mEq/L — ABNORMAL LOW (ref 135–145)

## 2019-12-11 NOTE — Progress Notes (Signed)
Chief Complaint: Refractory ascites   Referring Physician(s): Armbruster,Steven P  History of Present Illness: Meagan Peck is a 59 y.o. female presenting as a scheduled follow up today Meagan [place], Meagan discuss interval imaging of liver Meagan determine best approach for a repeat attempt at TIPS.  .   Meagan Peck joins [place] today by telemedicine visit.  We confirmed her identity by 2 personal identifiers.  We first met Meagan Peck 10/30/19 when she was referred Meagan [place] for evaluation, and the note from this visit covers our initial assessment.   Since then we did make attempt for TIPS, performed 11/25/19.  At that time, we did not successfully perform the TIPS, secondary Meagan her variant anatomy.   She was discharged, with the plan Meagan pursue liver MRI and interval attempt.  She has since had the MRI performed.  The MRI confirms patent portal system, though with some non-occlusive thrombus in the portal vein.  She has small right and middle hepatic veins with somewhat atypical orientation Meagan the portal system, accounting for prior findings on initial attempt.  There is no left hepatic vein.   She is feeling fine.  She reports her recent paracentesis yielded 13L.    She tells me she "is ready Meagan get the TIPS" for relief.    Of interest, between the consult date of 6/30 and the procedure date of 7/26, the MELD changed from 19 Meagan 25. I discussed this at the time of the TIPS attempt with Dr. Havery Moros, and we agree that given this is not driven by liver dysfunction, more the elevated creatinine, and that there are really no other options for her, that proceeding with attempt is reasonable.  I also discussed this with the patient and she agrees.  She wants Meagan proceed for a chance at improving.    Past Medical History:  Diagnosis Date  . Acute medial meniscus tear of right knee 12/10/2015  . Allergy   . Arthritis   . Ascites   . Cataract    bilateral - surgery Meagan remove  . Cirrhosis ([place])    . Diabetes mellitus without complication ([place])    type 2 - last a1c was 5.2 per patient, no meds  . Esophageal varices ([place])   . GERD (gastroesophageal reflux disease)   . Hepatic cirrhosis ([place])   . HLD (hyperlipidemia)    no meds  . Hypertension    no meds  . Iron deficiency anemia 09/26/2018    Past Surgical History:  Procedure Laterality Date  . BIOPSY  08/20/2019   Procedure: BIOPSY;  Surgeon: Jackquline Denmark, MD;  Location: [facility];  Service: Gastroenterology;;  . CARPAL TUNNEL RELEASE Right   . CESAREAN SECTION    . CHOLECYSTECTOMY    . COLONOSCOPY  08/2019  . ESOPHAGEAL BANDING N/A 09/19/2019   Procedure: ESOPHAGEAL BANDING;  Surgeon: Yetta Flock, MD;  Location: [facility];  Service: Gastroenterology;  Laterality: N/A;  . ESOPHAGOGASTRODUODENOSCOPY (EGD) WITH PROPOFOL N/A 08/20/2019   Procedure: ESOPHAGOGASTRODUODENOSCOPY (EGD) WITH PROPOFOL;  Surgeon: Jackquline Denmark, MD;  Location: [facility];  Service: Gastroenterology;  Laterality: N/A;  . ESOPHAGOGASTRODUODENOSCOPY (EGD) WITH PROPOFOL N/A 09/19/2019   Procedure: ESOPHAGOGASTRODUODENOSCOPY (EGD) WITH PROPOFOL;  Surgeon: Yetta Flock, MD;  Location: [facility];  Service: Gastroenterology;  Laterality: N/A;  . EYE SURGERY Bilateral    removed cataracts  . GASTRIC VARICES BANDING N/A 08/20/2019   Procedure: GASTRIC VARICES BANDING;  Surgeon: Jackquline Denmark, MD;  [facility];  Service: Gastroenterology;  Laterality: N/A;  . IR PARACENTESIS  08/01/2019  . IR PARACENTESIS  08/19/2019  . IR PARACENTESIS  09/09/2019  . IR PARACENTESIS  09/23/2019  . IR PARACENTESIS  10/08/2019  . IR PARACENTESIS  10/23/2019  . IR PARACENTESIS  11/12/2019  . IR PARACENTESIS  11/21/2019  . IR PARACENTESIS  11/25/2019  . IR PARACENTESIS  12/05/2019  . IR RADIOLOGIST EVAL & MGMT  10/30/2019  . IR TIPS  11/25/2019  . KNEE ARTHROSCOPY Right 12/10/2015   Procedure: RIGHT KNEE ARTHROSCOPY WITH PARTIAL MEDIAL MENISCECTOMY;   Surgeon: Mcarthur Rossetti, MD;  Location: [facility];  Service: Orthopedics;  Laterality: Right;  . KNEE ARTHROSCOPY W/ MENISCAL REPAIR Bilateral   . MENISCUS REPAIR Left   . RADIOLOGY WITH ANESTHESIA N/A 11/25/2019   Procedure: TIPS;  Surgeon: Corrie Mckusick, DO;  Location: [facility];  Service: Anesthesiology;  Laterality: N/A;    Allergies: Penicillins, Atrovent [ipratropium], Naproxen, Quinine derivatives, and Sulfa antibiotics  Medications: Prior Meagan Admission medications   Medication Sig Start Date End Date Taking? Authorizing Provider  ACCU-CHEK FASTCLIX LANCETS MISC Check fsbs TID 08/09/16   [provider]  aspirin 81 MG tablet Take 1 tablet (81 mg total) by mouth daily. Patient taking differently: Take 81 mg by mouth at bedtime.  08/30/19   Cherylann Ratel A, DO  cetirizine (ZYRTEC) 10 MG tablet Take 1 tablet (10 mg total) by mouth daily. Patient taking differently: Take 10 mg by mouth daily as needed for allergies.  01/02/19   [name], DO  Cholecalciferol (VITAMIN D3) 5000 UNITS TABS Take 5,000 Units by mouth daily.     [provider]  ferrous sulfate 325 (65 FE) MG EC tablet Take 1 tablet (325 mg total) by mouth 2 (two) times daily with a meal. 09/26/18   [name], DO  Krill Oil 500 MG CAPS Take 500 mg by mouth daily.    [provider]  magnesium oxide (MAG-OX) 400 MG tablet Take 1 tablet (400 mg total) by mouth 2 (two) times daily. 01/02/19   [name], DO  mupirocin ointment (BACTROBAN) 2 % Apply 1 application topically 3 (three) times daily. Patient taking differently: Apply 1 application topically daily.  10/22/19   Kandra Nicolas, MD     Family History  Problem Relation Age of Onset  . Hypertension Mother   . COPD Mother   . Alcoholism Father   . Heart attack Father   . Diabetes Brother   . Diabetes Paternal Grandmother   . Heart disease Paternal Grandmother   . Alcoholism Paternal Uncle   . Alcoholism Maternal Uncle     . Heart disease Maternal Grandmother   . Heart disease Maternal Grandfather   . Heart disease Paternal Grandfather     Social History   Socioeconomic History  . Marital status: Married    Spouse name: Cindi Ghazarian  . Number of children: 1  . Years of education: Not on file  . Highest education level: Associate degree: academic program  Occupational History  . Not on file  Tobacco Use  . Smoking status: Never Smoker  . Smokeless tobacco: Never Used  Vaping Use  . Vaping Use: Never used  Substance and Sexual Activity  . Alcohol use: No  . Drug use: No  . Sexual activity: Yes    Partners: Male    Birth control/protection: None, Post-menopausal  Other Topics Concern  . Not on file  Social History Narrative  . Not on  file   Social Determinants of Health   Financial Resource Strain:   . Difficulty of Paying Living Expenses:   Food Insecurity:   . Worried About Charity fundraiser in the Last Year:   . Arboriculturist in the Last Year:   Transportation Needs:   . Film/video editor (Medical):   Marland Kitchen Lack of Transportation (Non-Medical):   Physical Activity:   . Days of Exercise per Week:   . Minutes of Exercise per Session:   Stress:   . Feeling of Stress :   Social Connections:   . Frequency of Communication with Friends and Family:   . Frequency of Social Gatherings with Friends and Family:   . Attends Religious Services:   . Active Member of Clubs or Organizations:   . Attends Archivist Meetings:   Marland Kitchen Marital Status:       Review of Systems  Review of Systems: A 12 point ROS discussed and pertinent positives are indicated in the HPI above.  All other systems are negative.  Physical Exam No direct physical exam was performed (except for noted visual exam findings with Video Visits).    Vital Signs: LMP 12/03/2013 (LMP Unknown)   Imaging: Meagan ABDOMEN W WO CONTRAST  Result Date: 12/11/2019 CLINICAL DATA:  [name] cirrhosis. Liver screening.  Attempted TIPS 11/25/2019. EXAM: MRI ABDOMEN WITHOUT AND WITH CONTRAST TECHNIQUE: Multiplanar multisequence Meagan imaging of the abdomen was performed both before and after the administration of intravenous contrast. CONTRAST:  59m GADAVIST GADOBUTROL 1 MMOL/ML IV SOLN COMPARISON:  None. FINDINGS: Lower chest: No acute abnormality at the lung bases. Hepatobiliary: Diffusely irregular liver surface compatible with cirrhosis. Advanced atrophy of the lateral segment left liver lobe. No hepatic steatosis. No liver masses. No arterial foci of liver hyperenhancement. Cholecystectomy. No biliary ductal dilatation. Common bile duct diameter 3 mm. No choledocholithiasis. No biliary masses, strictures or beading. Pancreas: No pancreatic mass or duct dilation.  No pancreas divisum. Spleen: Moderate splenomegaly. Craniocaudal splenic length 15.2 cm. No splenic mass. Adrenals/Urinary Tract: Normal adrenals. No hydronephrosis. Normal kidneys with no renal mass. Stomach/Bowel: Normal non-distended stomach. Visualized small and large bowel is normal caliber, with no bowel wall thickening. Vascular/Lymphatic: Normal caliber abdominal aorta. Patent renal and splenic veins. The right and middle hepatic veins appeared diminutive and patent. The left hepatic vein is atrophic and not well visualized. Nonocclusive thrombosis of entire main portal vein with thrombus extending into the very proximal portions of the right and left portal veins. Patent SMV. Mild gastroesophageal and paraumbilical varices. No pathologically enlarged lymph nodes in the abdomen. Other: Large volume abdominal ascites.  No focal fluid collection. Musculoskeletal: No aggressive appearing focal osseous lesions. IMPRESSION: 1. Cirrhosis. No liver masses. 2. 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 3. Diminutive patent right and middle hepatic veins. Advanced atrophy of the lateral  segment left liver lobe with atrophic left hepatic vein, poorly visualized. 4. Moderate splenomegaly. Mild gastroesophageal and paraumbilical varices. Large volume abdominal ascites. Electronically Signed   By: JIlona SorrelM.D.   On: 12/11/2019 09:16   IR Tips  Result Date: 11/25/2019 CLINICAL DATA:  59year old female with a history of portal hypertension, refractory ascites, presenting for tips EXAM: ULTRASOUND-GUIDED PARACENTESIS ABORTED TIPS ULTRASOUND-GUIDED ACCESS RIGHT INTERNAL JUGULAR VEIN MEDICATIONS: Clindamycin antibiotics ANESTHESIA/SEDATION: General - as administered by the Anesthesia department The patient was continuously monitored during the procedure by the interventional radiology  nurse under my direct supervision. CONTRAST:  4m OMNIPAQUE IOHEXOL 300 MG/ML SOLN, 444mOMNIPAQUE IOHEXOL 300 MG/ML SOLN FLUOROSCOPY TIME:  Fluoroscopy Time: 31 minutes 6 seconds (602 mGy). COMPLICATIONS: None PROCEDURE: Informed written consent was obtained from the patient's family after a thorough discussion of the procedural risks, benefits and alternatives. Specific risks discussed with TIPS/variceal embolization included: Bleeding, infection, vascular injury, need for further procedure/surgery, renal injury/renal failure, contrast reaction, non-target embolization, liver dysfunction/failure, hepatic encephalopathy, stroke (~1%), cardiopulmonary collapse, death. All questions were addressed. Maximal Sterile Barrier Technique was utilized including caps, mask, sterile gowns, sterile gloves, sterile drape, hand hygiene and skin antiseptic. A timeout was performed prior Meagan the initiation of the procedure. Patient was positioned supine position on the table, with the right upper quadrant and the right neck prepped and draped in the usual sterile fashion. Ultrasound survey of the right upper quadrant was performed, with images stored and sent Meagan PACs. Ultrasound images of the abdomen performed 2 identifies safe  site for paracentesis. A small stab incision was made, and then a Safe-T-Centesis kit was advanced into the abdomen. We initiated large volume paracentesis with vacuo-tainer. Using ultrasound guidance, Chiba needle was used access the right portal system. Once we confirmed position in the portal system with portal venography, micro wire was advanced into the inferior mesenteric vein. The inner dilator was advanced over the metal stiffener into the portal system, with the inner dilator advanced over the wire. Venogram confirmed are location within the portal system. The micro wire was then replaced into the inner dilator, with the transition point position at the apex of the catheter. Check flow vial was placed on the back and of the inner dilator. Ultrasound survey was then performed of the right neck, with images stored and sent Meagan PACs. Ultrasound guidance was then used Meagan access the right internal jugular vein with a micro puncture kit. The wire was advanced under fluoroscopy into the right atrium, and a small incision was made. The needle was removed and the dilator was placed. The micro wire in the stiffener were removed and an 035 wire was then passed into the inferior vena cava. Ten French soft tissue dilation was performed on the wire, and then 10 [place] sheath was placed. Combination of Benson wire and multipurpose angled catheter were used Meagan select several right hepatic vein. Initially we elected the largest and most caudal oriented hepatic vein for attempted trans a Paddock tract. Sheath was then advanced over the catheter with a coaxial wire, for a position cephalad Meagan the transition point of the wire in the portal venous system. Simultaneous venogram of the portal system and the hepatic venous system was performed both in a frontal projection and a right anterior oblique projection. The Colapinto needle was then advanced through the TIPS sheath housed in the Teflon sheath over the Amplatz wire.  Using frontal and oblique projections, approximately 3 passes were attempted Meagan access the portal venous system. Given the orientation of the portal system and the hepatic vein, we then withdrew the colapinto needle in attempt Meagan find a more cephalad hepatic vein for access. Two additional hepatic veins were selected. Neither of these was large enough or with an orientation that would allow passage of the tips sheath. Thus, we decided Meagan return Meagan the first and largest of the hepatic veins for additional attempts for trans a Paddock tract Meagan the right portal system. Multiple attempts at passing the colapinto needle were unsuccessful in entering the portal system.  Ultimately we decided Meagan withdraw at 32 minutes of fluoro time, having not identified an adequate angle from hepatic vein Meagan portal vein tract. All catheters and wires were removed with manual pressure for hemostasis. Patient tolerated the procedure well and remained hemodynamically stable throughout. No complications were encountered and no significant blood loss. IMPRESSION: Status post ultrasound-guided paracentesis, ultrasound-guided right IJ access, ultrasound-guided percutaneous transhepatic portal access, for TIPS attempt, which was ultimately unsuccessful. Signed, Dulcy Fanny. Dellia Nims, RPVI [facility] Radiology PLAN: The patient will be observed overnight after recovery in the PACU. Patient will follow-up in [place], and we will discuss noncontrast Meagan or CT pending another attempt for TIPS Continue large volume paracentesis as needed Electronically Signed   By: Corrie Mckusick D.O.   On: 11/25/2019 11:17   US LIVER DOPPLER  Result Date: 11/12/2019 CLINICAL DATA:  59 year old female with a history of liver disease EXAM: DUPLEX ULTRASOUND OF LIVER TECHNIQUE: Color and duplex Doppler ultrasound was performed Meagan evaluate the hepatic in-flow and out-flow vessels. COMPARISON:  None. FINDINGS: Portal  Vein Velocities Main:  25 cm/sec Right:  23 cm/sec Left:  13 cm/sec Hepatic Vein Velocities Right:  95 cm/sec Middle:  101 cm/sec Left:  58 cm/sec Hepatic Artery Velocity:  125 cm/sec Splenic Vein Velocity:  22 cm/sec Varices: None visualized Ascites: Ascites present Splenic volume estimated 471 cc Nodular changes of the liver compatible with cirrhosis IMPRESSION: Duplex of the hepatic vascular demonstrates patent portal vein. Signed, Dulcy Fanny. Dellia Nims, RPVI [facility] Inter[facility] Radiology Special[facility] Radiology Electronically Signed   By: Corrie Mckusick D.O.   On: 11/12/2019 15:56   IR Paracentesis  Result Date: 12/05/2019 INDICATION: Patient with history of portal hypertension, refractory ascites, cirrhosis by imaging; request received for therapeutic paracentesis. EXAM: ULTRASOUND GUIDED THERAPEUTIC PARACENTESIS MEDICATIONS: 1% lidocaine Meagan skin and subcutaneous tissue COMPLICATIONS: None immediate. PROCEDURE: Informed written consent was obtained from the patient after a discussion of the risks, benefits and alternatives Meagan treatment. A timeout was performed prior Meagan the initiation of the procedure. Initial ultrasound scanning demonstrates a large amount of ascites within the right lower abdominal quadrant. The right lower abdomen was prepped and draped in the usual sterile fashion. 1% lidocaine was used for local anesthesia. Following this, a 19 gauge, 10-cm, Yueh catheter was introduced. An ultrasound image was saved for documentation purposes. The paracentesis was performed. The catheter was removed and a dressing was applied. The patient tolerated the procedure well without immediate post procedural complication. Patient received post-procedure intravenous albumin; see nursing notes for details. FINDINGS: A total of approximately 13 liters of yellow fluid was removed. IMPRESSION: Successful ultrasound-guided therapeutic paracentesis yielding 13 liters of peritoneal fluid. Read by: Lindaann Pascal Electronically Signed   By: Sandi Mariscal M.D.   On: 12/05/2019 10:34   IR Paracentesis  Result Date: 11/25/2019 CLINICAL DATA:  59 year old female with a history of portal hypertension, refractory ascites, presenting for tips EXAM: ULTRASOUND-GUIDED PARACENTESIS ABORTED TIPS ULTRASOUND-GUIDED ACCESS RIGHT INTERNAL JUGULAR VEIN MEDICATIONS: Clindamycin antibiotics ANESTHESIA/SEDATION: General - as administered by the Anesthesia department The patient was continuously monitored during the procedure by the interventional radiology nurse under my direct supervision. CONTRAST:  13m OMNIPAQUE IOHEXOL 300 MG/ML SOLN, 455mOMNIPAQUE IOHEXOL 300 MG/ML SOLN FLUOROSCOPY TIME:  Fluoroscopy Time: 31 minutes 6 seconds (602 mGy). COMPLICATIONS: None PROCEDURE: Informed written consent was obtained from the patient's family after a thorough discussion of the procedural risks, benefits and alternatives. Specific risks discussed with  TIPS/variceal embolization included: Bleeding, infection, vascular injury, need for further procedure/surgery, renal injury/renal failure, contrast reaction, non-target embolization, liver dysfunction/failure, hepatic encephalopathy, stroke (~1%), cardiopulmonary collapse, death. All questions were addressed. Maximal Sterile Barrier Technique was utilized including caps, mask, sterile gowns, sterile gloves, sterile drape, hand hygiene and skin antiseptic. A timeout was performed prior Meagan the initiation of the procedure. Patient was positioned supine position on the table, with the right upper quadrant and the right neck prepped and draped in the usual sterile fashion. Ultrasound survey of the right upper quadrant was performed, with images stored and sent Meagan PACs. Ultrasound images of the abdomen performed 2 identifies safe site for paracentesis. A small stab incision was made, and then a Safe-T-Centesis kit was advanced into the abdomen. We initiated large volume paracentesis with  vacuo-tainer. Using ultrasound guidance, Chiba needle was used access the right portal system. Once we confirmed position in the portal system with portal venography, micro wire was advanced into the inferior mesenteric vein. The inner dilator was advanced over the metal stiffener into the portal system, with the inner dilator advanced over the wire. Venogram confirmed are location within the portal system. The micro wire was then replaced into the inner dilator, with the transition point position at the apex of the catheter. Check flow vial was placed on the back and of the inner dilator. Ultrasound survey was then performed of the right neck, with images stored and sent Meagan PACs. Ultrasound guidance was then used Meagan access the right internal jugular vein with a micro puncture kit. The wire was advanced under fluoroscopy into the right atrium, and a small incision was made. The needle was removed and the dilator was placed. The micro wire in the stiffener were removed and an 035 wire was then passed into the inferior vena cava. Ten French soft tissue dilation was performed on the wire, and then 10 [place] TIPS sheath was placed. Combination of Benson wire and multipurpose angled catheter were used Meagan select several right hepatic vein. Initially we elected the largest and most caudal oriented hepatic vein for attempted trans a Paddock tract. Sheath was then advanced over the catheter with a coaxial wire, for a position cephalad Meagan the transition point of the wire in the portal venous system. Simultaneous venogram of the portal system and the hepatic venous system was performed both in a frontal projection and a right anterior oblique projection. The Colapinto needle was then advanced through the TIPS sheath housed in the Teflon sheath over the Amplatz wire. Using frontal and oblique projections, approximately 3 passes were attempted Meagan access the portal venous system. Given the orientation of the portal system and the  hepatic vein, we then withdrew the colapinto needle in attempt Meagan find a more cephalad hepatic vein for access. Two additional hepatic veins were selected. Neither of these was large enough or with an orientation that would allow passage of the tips sheath. Thus, we decided Meagan return Meagan the first and largest of the hepatic veins for additional attempts for trans a Paddock tract Meagan the right portal system. Multiple attempts at passing the colapinto needle were unsuccessful in entering the portal system. Ultimately we decided Meagan withdraw at 32 minutes of fluoro time, having not identified an adequate angle from hepatic vein Meagan portal vein tract. All catheters and wires were removed with manual pressure for hemostasis. Patient tolerated the procedure well and remained hemodynamically stable throughout. No complications were encountered and no significant blood loss. IMPRESSION: Status post  ultrasound-guided paracentesis, ultrasound-guided right IJ access, ultrasound-guided percutaneous transhepatic portal access, for TIPS attempt, which was ultimately unsuccessful. Signed, Dulcy Fanny. Dellia Nims, RPVI [facility] Inter[facility] Radiology Special[facility] Radiology PLAN: The patient will be observed overnight after recovery in the PACU. Patient will follow-up in [place], and we will discuss noncontrast Meagan or CT pending another attempt for TIPS Continue large volume paracentesis as needed Electronically Signed   By: Corrie Mckusick D.O.   On: 11/25/2019 11:17   IR Paracentesis  Result Date: 11/21/2019 INDICATION: Recurrent large volume ascites secondary Meagan hepatic cirrhosis. Request for therapeutic paracentesis up Meagan 7 L. EXAM: ULTRASOUND GUIDED PARACENTESIS MEDICATIONS: 1% lidocaine 10 mL COMPLICATIONS: None immediate. PROCEDURE: Informed written consent was obtained from the patient after a discussion of the risks, benefits and alternatives Meagan treatment. A timeout was performed prior Meagan the initiation of the  procedure. Initial ultrasound scanning demonstrates a large amount of ascites within the right lower abdominal quadrant. The right lower abdomen was prepped and draped in the usual sterile fashion. 1% lidocaine was used for local anesthesia. Following this, a 19 gauge, 7-cm, Yueh catheter was introduced. An ultrasound image was saved for documentation purposes. The paracentesis was performed. The catheter was removed and a dressing was applied. The patient tolerated the procedure well without immediate post procedural complication. FINDINGS: A total of approximately 7 L of clear yellow fluid was removed. IMPRESSION: Successful ultrasound-guided paracentesis yielding 7 liters of peritoneal fluid. Read by: Gareth Eagle, PA-C Electronically Signed   By: Corrie Mckusick D.O.   On: 11/21/2019 11:21   IR Paracentesis  Result Date: 11/12/2019 INDICATION: Patient with history of cirrhosis, recurrent ascites. Request Meagan IR for therapeutic paracentesis 10 L maximum. EXAM: ULTRASOUND GUIDED THERAPEUTIC PARACENTESIS MEDICATIONS: 8 mL 1% lidocaine COMPLICATIONS: None immediate. PROCEDURE: Informed written consent was obtained from the patient after a discussion of the risks, benefits and alternatives Meagan treatment. A timeout was performed prior Meagan the initiation of the procedure. Initial ultrasound scanning demonstrates a large amount of ascites within the right lower abdominal quadrant. The right lower abdomen was prepped and draped in the usual sterile fashion. 1% lidocaine was used for local anesthesia. Following this, a 19 gauge, 7-cm, Yueh catheter was introduced. An ultrasound image was saved for documentation purposes. The paracentesis was performed. The catheter was removed and a dressing was applied. The patient tolerated the procedure well without immediate post procedural complication. Patient received post-procedure intravenous albumin; see nursing notes for details. FINDINGS: A total of approximately 10.0 L of clear  yellow fluid was removed. IMPRESSION: Successful ultrasound-guided paracentesis yielding 10 liters of peritoneal fluid. Read by Candiss Norse, PA-C Electronically Signed   By: Corrie Mckusick D.O.   On: 11/12/2019 09:57    Labs:  CBC: Recent Labs    08/17/19 0556 08/17/19 0556 08/18/19 0519 08/18/19 0519 08/20/19 0251 08/20/19 0251 09/19/19 1307 11/25/19 0630 11/25/19 0852 11/25/19 0958  WBC 4.7  --  3.5*  --  3.2*  --   --  7.7  --   --   HGB 9.6*   < > 9.1*   < > 9.1*   < > 11.2* 9.1* 7.5* 8.8*  HCT 28.5*   < > 27.1*   < > 27.2*   < > 33.0* 27.7* 22.0* 26.0*  PLT 109*  --  87*  --  83*  --   --  153  --   --    < > = values in this interval not  displayed.    COAGS: Recent Labs    07/31/19 1154 08/16/19 0014 10/16/19 1720 11/25/19 0630  INR 1.4* 1.3* 1.3* 1.5*  APTT  --   --   --  35    BMP: Recent Labs    08/19/19 1314 08/19/19 1314 08/20/19 0251 08/29/19 1529 10/23/19 1616 10/23/19 1616 11/08/19 1454 11/08/19 1454 11/11/19 1646 11/11/19 1646 11/19/19 1640 11/25/19 0630 11/25/19 0852 11/25/19 0958  NA 131*   < > 133*   < > 130*   < > 124*   < > 125*   < > 123* 130* 132* 131*  K 4.7   < > 4.9   < > 4.6   < > 5.5 No hemolysis seen*   < > 5.2 No hemolysis seen*   < > 5.2* 4.8 4.8 5.3*  CL 102   < > 104   < > 102   < > 99  --  99  --  98 104  --   --   CO2 19*   < > 22   < > 19*   < > 18*  --  18*  --  20 17*  --   --   GLUCOSE 155*   < > 102*   < > 157*   < > 117*  --  131*  --  142* 149*  --   --   BUN 20   < > 21*   < > 29*   < > 38*  --  31*  --  29* 28*  --   --   CALCIUM 8.2*   < > 8.2*   < > 9.4   < > 8.7  --  8.5  --  8.4 8.3*  --   --   CREATININE 0.91   < > 0.94   < > 1.37*   < > 1.34*  --  1.56*  --  1.38* 1.96*  --   --   GFRNONAA >60  --  >60  --  43*  --   --   --   --   --   --  28*  --   --   GFRAA >60  --  >60  --  50*  --   --   --   --   --   --  32*  --   --    < > = values in this interval not displayed.    LIVER FUNCTION  TESTS: Recent Labs    08/20/19 0251 09/06/19 1428 09/16/19 1638 11/25/19 0630  BILITOT 0.8 1.4* 1.2 1.9*  AST 38 41* 50* 30  ALT 22 27 28 19   ALKPHOS 60 93 83 106  PROT 5.1* 6.9 6.3 5.2*  ALBUMIN 2.3* 3.3* 3.3* 2.4*    TUMOR MARKERS: Recent Labs    07/31/19 1154  AFPTM 2.3    Assessment and Plan:  Meagan Peck is a very pleasant 59 yo female presenting Meagan VIR Meagan again discuss interval imaging and another attempt at TIPS creation as an option for her refractory ascites.    She has had interval MRI imaging, which is useful Meagan understand her variant anatomy for future attempt.    Our previous discussion regarding TIPS in the indication for refractory ascites, as well as the implication of refractory ascites is documented in the original consult of 10/30/19.    Today we did discussed her change in MELD that was calculated, and that we can sometimes see  fluctuations.  Specifically, it seems her increase is being driven by the renal dysfunction, not the liver/total bilirubin.  In these scenario, we can see TIPS help renal function recover, although we acknowledge the relative contraindication with high MELD scores.  I did let her know that I have discussed this with Dr. Havery Moros on the day of her procedure, and we agree that proceeding is reasonable Meagan try and help her.  She is very interested in proceeding with TIPS for relief.    Risks and benefits of TIPS, and/or additional variceal embolization have been discussed with Meagan Peck including: infection, bleeding, damage Meagan adjacent structures, worsening hepatic and/or cardiac function, worsening and/or the development of altered mental status/encephalopathy, need for lactulose, need for additional procedure/surgery, TIPS reversal, non-target embolization, cardiopulmonary collapse, death.   After our discussion, she would like Meagan proceed with TIPS.  On this next attempt, we will plan on possibly having advanced imaging techniques  available such as ICE/IVUS, as well as considering maneuvers such as direct intra-hepatic portal-systemic shunt (DIPS) and/or gunsight maneuvers.   Plan: - We will plan on TIPS with Dr. Earleen Newport and general anesthesia, with large volume paracentesis, as [facility].  We will need Meagan plan for 2 VIR operators on this date, anticipating the need for second person Meagan assist with ICE/IVUS advanced imaging - Continue current care     Electronically Signed: Corrie Mckusick 12/11/2019, 3:54 PM   I spent a total of    15 Minutes in remote  clinical consultation, greater than 50% of which was counseling/coordinating care for review of surgical planning MRI, refractory ascites, possible TIPS.    Visit type: Audio only (telephone). Audio (no video) only due Meagan patient's lack of internet/smartphone capability. Alternative for in-person consultation at [facility], [address] [place], [place], [place]. This visit type was conducted due Meagan national recommendations for restrictions regarding the COVID-19 Pandemic (e.g. social distancing).  This format is felt Meagan be most appropriate for this patient at this time.  All issues noted in this document were discussed and addressed.

## 2019-12-12 ENCOUNTER — Telehealth: Payer: Self-pay

## 2019-12-12 NOTE — Telephone Encounter (Signed)
Patient had a TIPS procedure on 7-26 which was unsuccessful.  They are waiting on approval and will schedule with Dr. Earleen Newport for a 2nd attempt at [place].

## 2019-12-12 NOTE — Telephone Encounter (Signed)
Called and LM for Vickie at [facility] regarding TIPS procedure. Has she been scheduled?

## 2019-12-12 NOTE — Telephone Encounter (Signed)
Patient called office stating she will need another MRI and procedure but will need an authorization for it. Patient was advised of your last note, informing her to contact the office where services will be given. Patient says she was told this authorization needs to come from her PCP. Please advise

## 2019-12-12 NOTE — Telephone Encounter (Signed)
I am not going to do this unfortunately, this is a patient safety issue.  The recent MRI results were abnormal and I did not know what kind of follow-up to arrange for her -I cannot be held responsible for results of a test to that another specialist wants.  Even with this insurance, her specialists should be able to order tests once I have referred her to that specialist.  Please ask her to reach out to Dr. Earleen Newport and Dr Havery Moros, I have CCed him on this.

## 2019-12-13 ENCOUNTER — Other Ambulatory Visit: Payer: Self-pay

## 2019-12-13 ENCOUNTER — Ambulatory Visit (HOSPITAL_COMMUNITY)
Admission: RE | Admit: 2019-12-13 | Discharge: 2019-12-13 | Disposition: A | Discharge status: Home / Self Care | Payer: No Typology Code available for payment source | Source: Ambulatory Visit | Attending: Gastroenterology | Admitting: Gastroenterology

## 2019-12-13 DIAGNOSIS — K7460 Unspecified cirrhosis of liver: Secondary | ICD-10-CM | POA: Diagnosis not present

## 2019-12-13 DIAGNOSIS — R188 Other ascites: Secondary | ICD-10-CM | POA: Insufficient documentation

## 2019-12-13 HISTORY — PX: IR PARACENTESIS: IMG2679

## 2019-12-13 LAB — BODY FLUID CELL COUNT WITH DIFFERENTIAL
Lymphs, Fluid: 38 %
Monocyte-Macrophage-Serous Fluid: 62 % (ref 50–90)
Total Nucleated Cell Count, Fluid: 35 cu mm (ref 0–1000)

## 2019-12-13 MED ORDER — ALBUMIN HUMAN 25 % IV SOLN
50.0000 g | Freq: Once | INTRAVENOUS | Status: DC
  Filled 2019-12-13: qty 200

## 2019-12-13 MED ORDER — LIDOCAINE HCL 1 % IJ SOLN
INTRAMUSCULAR | Status: AC
  Filled 2019-12-13: qty 20

## 2019-12-13 MED ORDER — ALBUMIN HUMAN 25 % IV SOLN
INTRAVENOUS | Status: AC
  Administered 2019-12-13: 50 g
  Filled 2019-12-13: qty 200

## 2019-12-13 MED ORDER — LIDOCAINE HCL 1 % IJ SOLN
INTRAMUSCULAR | Status: DC | PRN
  Administered 2019-12-13: 10 mL

## 2019-12-13 NOTE — Telephone Encounter (Signed)
Thanks for the message. I am not sure why she would need another MRI at this point, I've reached out to Dr. Earleen Newport regarding the results of her most recent exam and clarify if / when another attempt at TIPS may be happening, as that will influence her management decisions moving forward. Will await to hear back from him regarding plan. If she needs additional orders for this part of her care moving forward she should reach out to [place] or  IR directly. Thanks

## 2019-12-13 NOTE — Telephone Encounter (Signed)
Thank you.  Order for BMET has been entered. Will ask patient to go to the lab in 2 weeks unless she has labs beforehand.

## 2019-12-13 NOTE — Telephone Encounter (Signed)
As a follow up, I spoke with Dr. Earleen Newport about her case. He feels there is a good chance of successful TIPS and is planning another attempt soon. There is no need for further imaging with MRI, I'm not sure why she thinks she needs that. She has a nonocclusive portal vein thrombosis, unclear chronicity, he can still do TIPS with that present and it may in fact help with flow further. She has had large varices requiring banding in the past, will hold off on anticoagulation for that right now and see how she does with the TIPS which will help treat the varices.   [name], FYI, to make you aware of plan. I'd like her to have a repeat BMET in 2 weeks if she does not have it done sooner with IR. Thanks

## 2019-12-16 LAB — PATHOLOGIST SMEAR REVIEW

## 2019-12-18 ENCOUNTER — Other Ambulatory Visit (HOSPITAL_COMMUNITY): Payer: Self-pay | Admitting: Interventional Radiology

## 2019-12-18 ENCOUNTER — Telehealth (HOSPITAL_COMMUNITY): Payer: Self-pay | Admitting: Radiology

## 2019-12-18 DIAGNOSIS — K745 Biliary cirrhosis, unspecified: Secondary | ICD-10-CM

## 2019-12-18 NOTE — Telephone Encounter (Signed)
Called pt, left VM letting her know that I am working on setting her TIPS procedure up with Dr. Earleen Newport. I will call her back when I have available dates for the rads to be at [facility]. JM

## 2019-12-19 ENCOUNTER — Encounter (HOSPITAL_COMMUNITY): Payer: Self-pay | Admitting: *Deleted

## 2019-12-19 ENCOUNTER — Inpatient Hospital Stay (HOSPITAL_COMMUNITY): Payer: No Typology Code available for payment source | Admitting: Certified Registered Nurse Anesthetist

## 2019-12-19 ENCOUNTER — Other Ambulatory Visit: Payer: Self-pay

## 2019-12-19 ENCOUNTER — Other Ambulatory Visit: Payer: Self-pay | Admitting: Physician Assistant

## 2019-12-19 NOTE — Progress Notes (Signed)
Anesthesia Chart Review: Kathleene Hazel   Case: [id number] Date/Time: 12/20/19 0745   Procedure: IR WITH ANESTHESIA  TRANSJUGULAR INTRAHEPATIC PORTOSYSTEMIC SHUNT-TIPS (N/A )   Anesthesia type: General   Pre-op diagnosis: CIRRHOSIS  OF THE LIVER   Location: [facility] OR RADIOLOGY ROOM / [place] OR   Surgeons: Radiologist, Medication, MD      DISCUSSION: Patient is a 59 year old female scheduled for the above procedure.  Diagnosis of cirrhosis in March 2021.  Management of ascites has been quite difficult.  She had hyperkalemia on Aldactone then hypokalemia and hyponatremia on Lasix. She failed TIPS attempt on 11/25/19 due to variant anatomy. She had a liver MRI to better delineate her anatomy and is now scheduled for a second attempt at TIPS. Last paracentesis 12/13/19. She did not have any evidence of variceal bleeding but had 2 EGDs with banding in April and May.  She cannot tolerate beta-blockade due to her refractory ascites and resting heart rate around 60.   History includes never smoker, HTN, DM2, HLD, GERD, iron deficiency anemia, hepatic cirrhosis (with ascites, esophageal varices; suspected NASH cirrhosis).  She is for preprocedure COVID-19 testing on the day of procedure. Anesthesia team evaluation and updated labs on arrival.    VS:   Wt Readings from Last 3 Encounters:  11/25/19 66.7 kg  10/30/19 64 kg  10/16/19 62.1 kg   BP Readings from Last 3 Encounters:  11/26/19 (!) 106/60  11/21/19 (!) 102/55  11/12/19 (!) 99/53   Pulse Readings from Last 3 Encounters:  11/26/19 74  10/30/19 (!) 109  10/22/19 96    PROVIDERS: [name], DO is PCP  Bassett Cellar, MD is GI - She had a cardiac work-up for chest pain and syncope per Jyl Heinz, MD on 11/27/17 (see CV below)   LABS: Most recent lab results include: Lab Results  Component Value Date   WBC 7.7 11/25/2019   HGB 8.8 (L) 11/25/2019   HCT 26.0 (L) 11/25/2019   PLT 153 11/25/2019   GLUCOSE 128 (H) 12/11/2019    ALT 19 11/25/2019   AST 30 11/25/2019   NA 128 (L) 12/11/2019   K 5.3 No hemolysis seen (H) 12/11/2019   CL 104 12/11/2019   CREATININE 1.49 (H) 12/11/2019   BUN 33 (H) 12/11/2019   CO2 16 (L) 12/11/2019   INR 1.5 (H) 11/25/2019   HGBA1C 5.2 10/30/2019    OTHER: Endoscopic history (as outlined by Dr. Havery Moros): EGD 09/19/19 - - Esophageal varices noted as outlined above - 5 bands placed resulting in deflation. - Normal stomach. - Benign small polyp of the duodenum previously biopsied, not biopsied today. - Normal duodenum otherwise  EGD 08/20/19 - Grade II and large (>5 mm) esophageal varices. Banded x 5 - Portal hypertensive gastropathy. - Single nodule in duodenal bulb. Biopsied.  Colonoscopy4/13/21 -A few small, nonbleeding sigmoid, descending, IC valve AVMs, were not treated or ablated. Small internal hemorrhoids.   IMAGES: MRI Abdomen 12/10/19: IMPRESSION: 1. Cirrhosis. No liver masses. 2. 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 3. Diminutive patent right and middle hepatic veins. Advanced atrophy of the lateral segment left liver lobe with atrophic left hepatic vein, poorly visualized. 4. Moderate splenomegaly. Mild gastroesophageal and paraumbilical varices. Large volume abdominal ascites.   EKG: 08/15/19: Normal sinus rhythm Low voltage QRS Cannot rule out Anterior infarct , age undetermined Abnormal ECG Confirmed by Madalyn Rob [phone]) on 08/16/2019 4:15:47 PM  CV: Echo 07/25/19: IMPRESSIONS  1. Left ventricular ejection fraction, by estimation, is 60 to 65%. The  left ventricle has normal function. The left ventricle has no regional  wall motion abnormalities. Left ventricular diastolic parameters were  normal.  2. Right ventricular systolic function is normal. The right ventricular  size is normal. There is normal pulmonary artery systolic  pressure.  3. There is mild to moderate posterior annular calcification. Mild mitral  valve regurgitation. No evidence of mitral stenosis.  4. The aortic valve is normal in structure. Aortic valve regurgitation is  not visualized. No aortic stenosis is present.  5. The inferior vena cava is normal in size with greater than 50%  respiratory variability, suggesting right atrial pressure of 3 mmHg.    Nuclear stress test 11/29/17:  Nuclear stress EF: 72%.  Blood pressure demonstrated a normal response to exercise.  No T wave inversion was noted during stress.  There was no ST segment deviation noted during stress.  Defect 1: There is a small defect of mild severity.  This is a low risk study. Small size, mild intensity fixed apical/apical lateral attenuation artifact. No reversible ischemia. LVEF 72% with normal wall motion. This is a low risk study.   Carotid US 11/24/17: Final Interpretation:  Right Carotid: Velocities in the right ICA are consistent with a 1-39%  stenosis.  Left Carotid: Velocities in the left ICA are consistent with a 1-39%  stenosis.  Vertebrals: Bilateral vertebral arteries demonstrate antegrade flow.  Subclavians: Normal flow hemodynamics were seen in bilateral subclavian        arteries.    Holter Monitor 11/21/17:  Duration of test: 48 hours Indication: Syncope and collapse Baseline rhythm: Sinus Minimum heart rate: 61 BPM. Average heart rate: 82 BPM. Maximal heart rate 137 BPM. Atrial arrhythmia: None significant.  Ventricular arrhythmia: None significant  Conduction abnormality: None significant Symptoms: None significant Conclusion: Holter monitoring was unremarkable.   Past Medical History:  Diagnosis Date  . Acute medial meniscus tear of right knee 12/10/2015  . Allergy   . Arthritis    bil feet  . Ascites   . Cataract    bilateral - surgery to remove  . Cirrhosis ([place])   . Diabetes  mellitus without complication ([place])    type 2 - last a1c was 5.2 per patient, no meds  . Dyspnea    when I have too much fluid  . Esophageal varices ([place])   . GERD (gastroesophageal reflux disease)   . Heart murmur   . Hepatic cirrhosis ([place])   . HLD (hyperlipidemia)    no meds  . Hypertension    no meds  . Iron deficiency anemia 09/26/2018    Past Surgical History:  Procedure Laterality Date  . BIOPSY  08/20/2019   Procedure: BIOPSY;  Surgeon: Jackquline Denmark, MD;  Location: [facility];  Service: Gastroenterology;;  . CARPAL TUNNEL RELEASE Right   . CESAREAN SECTION    . CHOLECYSTECTOMY    . COLONOSCOPY  08/2019  . ESOPHAGEAL BANDING N/A 09/19/2019   Procedure: ESOPHAGEAL BANDING;  Surgeon: Yetta Flock, MD;  Location: [facility];  Service: Gastroenterology;  Laterality: N/A;  . ESOPHAGOGASTRODUODENOSCOPY (EGD) WITH PROPOFOL N/A 08/20/2019   Procedure: ESOPHAGOGASTRODUODENOSCOPY (EGD) WITH PROPOFOL;  Surgeon: Jackquline Denmark, MD;  Location: [facility];  Service: Gastroenterology;  Laterality: N/A;  . ESOPHAGOGASTRODUODENOSCOPY (EGD) WITH PROPOFOL N/A 09/19/2019   Procedure: ESOPHAGOGASTRODUODENOSCOPY (EGD) WITH PROPOFOL;  Surgeon: Yetta Flock, MD;  Location: [facility];  Service: Gastroenterology;  Laterality: N/A;  . EYE SURGERY Bilateral    removed cataracts  . GASTRIC VARICES BANDING N/A 08/20/2019   Procedure: GASTRIC VARICES BANDING;  Surgeon: Jackquline Denmark, MD;  Location: [facility];  Service: Gastroenterology;  Laterality: N/A;  . IR PARACENTESIS  08/01/2019  . IR PARACENTESIS  08/19/2019  . IR PARACENTESIS  09/09/2019  . IR PARACENTESIS  09/23/2019  . IR PARACENTESIS  10/08/2019  . IR PARACENTESIS  10/23/2019  . IR PARACENTESIS  11/12/2019  . IR PARACENTESIS  11/21/2019  . IR PARACENTESIS  11/25/2019  . IR PARACENTESIS  12/05/2019  . IR PARACENTESIS  12/13/2019  . IR RADIOLOGIST EVAL & MGMT  10/30/2019  . IR TIPS  11/25/2019  . KNEE ARTHROSCOPY Right 12/10/2015    Procedure: RIGHT KNEE ARTHROSCOPY WITH PARTIAL MEDIAL MENISCECTOMY;  Surgeon: Mcarthur Rossetti, MD;  Location: [facility];  Service: Orthopedics;  Laterality: Right;  . KNEE ARTHROSCOPY W/ MENISCAL REPAIR Bilateral   . MENISCUS REPAIR Left   . RADIOLOGY WITH ANESTHESIA N/A 11/25/2019   Procedure: TIPS;  Surgeon: Corrie Mckusick, DO;  Location: [facility];  Service: Anesthesiology;  Laterality: N/A;    MEDICATIONS: No current facility-administered medications for this encounter.   Marland Kitchen ACCU-CHEK FASTCLIX LANCETS MISC  . aspirin 81 MG tablet  . cetirizine (ZYRTEC) 10 MG tablet  . Cholecalciferol (VITAMIN D3) 5000 UNITS TABS  . ferrous sulfate 325 (65 FE) MG EC tablet  . Krill Oil 500 MG CAPS  . magnesium oxide (MAG-OX) 400 MG tablet  . mupirocin ointment (BACTROBAN) 2 %    Myra Gianotti, PA-C Surgical Short Stay/Anesthesiology [facility] Phone [phone] [facility] Phone [phone] 12/19/2019 3:53 PM

## 2019-12-19 NOTE — Progress Notes (Addendum)
Mrs Meagan Peck denies chest pain or shortness of breath.  Patient denies any s/s of Covid an denies being  In contact with any one who has. [name] lives in [place] and can not make it to the testing site before it closes. Patient will have to be tested in am.  [name] has type II diabetes, she is not on any mediations.  Patient checks CBG daily, CBG's run 112-120. [name] reports that last A1C was 5.2/ I instructed patient to check CBG after awaking and every 2 hours until arrival  to the hospital.  I Instructed patient if CBG is less than 70 to take 4 Glucose Tablets  Recheck CBG in 15 minutes then call pre- op desk at [phone] for further instructions.

## 2019-12-19 NOTE — Anesthesia Preprocedure Evaluation (Signed)
Anesthesia Evaluation    Airway        Dental   Pulmonary           Cardiovascular hypertension,      Neuro/Psych    GI/Hepatic   Endo/Other  diabetes  Renal/GU      Musculoskeletal   Abdominal   Peds  Hematology   Anesthesia Other Findings   Reproductive/Obstetrics                             Anesthesia Physical Anesthesia Plan  ASA:   Anesthesia Plan:    Post-op Pain Management:    Induction:   PONV Risk Score and Plan:   Airway Management Planned:   Additional Equipment:   Intra-op Plan:   Post-operative Plan:   Informed Consent:   Plan Discussed with:   Anesthesia Plan Comments: (PAT note written 12/19/2019 by Myra Gianotti, PA-C. )        Anesthesia Quick Evaluation

## 2019-12-20 ENCOUNTER — Encounter (HOSPITAL_COMMUNITY)
Admission: RE | Disposition: A | Discharge status: Home / Self Care | Payer: Self-pay | Source: Home / Self Care | Attending: Interventional Radiology

## 2019-12-20 ENCOUNTER — Ambulatory Visit (HOSPITAL_COMMUNITY)
Admission: RE | Admit: 2019-12-20 | Discharge: 2019-12-20 | Disposition: A | Discharge status: Home / Self Care | Payer: No Typology Code available for payment source | Source: Ambulatory Visit | Attending: Interventional Radiology | Admitting: Interventional Radiology

## 2019-12-20 ENCOUNTER — Encounter (HOSPITAL_COMMUNITY): Payer: Self-pay | Admitting: Interventional Radiology

## 2019-12-20 ENCOUNTER — Ambulatory Visit (HOSPITAL_COMMUNITY)
Admission: RE | Admit: 2019-12-20 | Discharge: 2019-12-20 | Disposition: A | Discharge status: Home / Self Care | Payer: No Typology Code available for payment source | Attending: Interventional Radiology | Admitting: Interventional Radiology

## 2019-12-20 DIAGNOSIS — R188 Other ascites: Secondary | ICD-10-CM | POA: Insufficient documentation

## 2019-12-20 DIAGNOSIS — K7581 Nonalcoholic steatohepatitis (NASH): Secondary | ICD-10-CM | POA: Insufficient documentation

## 2019-12-20 DIAGNOSIS — Z20822 Contact with and (suspected) exposure to covid-19: Secondary | ICD-10-CM | POA: Insufficient documentation

## 2019-12-20 DIAGNOSIS — Z880 Allergy status to penicillin: Secondary | ICD-10-CM | POA: Diagnosis not present

## 2019-12-20 DIAGNOSIS — K7460 Unspecified cirrhosis of liver: Secondary | ICD-10-CM | POA: Insufficient documentation

## 2019-12-20 DIAGNOSIS — K745 Biliary cirrhosis, unspecified: Secondary | ICD-10-CM | POA: Insufficient documentation

## 2019-12-20 DIAGNOSIS — D509 Iron deficiency anemia, unspecified: Secondary | ICD-10-CM | POA: Diagnosis not present

## 2019-12-20 DIAGNOSIS — E785 Hyperlipidemia, unspecified: Secondary | ICD-10-CM | POA: Diagnosis not present

## 2019-12-20 DIAGNOSIS — I10 Essential (primary) hypertension: Secondary | ICD-10-CM | POA: Insufficient documentation

## 2019-12-20 DIAGNOSIS — Z539 Procedure and treatment not carried out, unspecified reason: Secondary | ICD-10-CM | POA: Insufficient documentation

## 2019-12-20 DIAGNOSIS — Z7982 Long term (current) use of aspirin: Secondary | ICD-10-CM | POA: Diagnosis not present

## 2019-12-20 DIAGNOSIS — K219 Gastro-esophageal reflux disease without esophagitis: Secondary | ICD-10-CM | POA: Insufficient documentation

## 2019-12-20 DIAGNOSIS — K7469 Other cirrhosis of liver: Secondary | ICD-10-CM

## 2019-12-20 DIAGNOSIS — E119 Type 2 diabetes mellitus without complications: Secondary | ICD-10-CM | POA: Diagnosis not present

## 2019-12-20 DIAGNOSIS — Z882 Allergy status to sulfonamides status: Secondary | ICD-10-CM | POA: Insufficient documentation

## 2019-12-20 DIAGNOSIS — Z79899 Other long term (current) drug therapy: Secondary | ICD-10-CM | POA: Diagnosis not present

## 2019-12-20 DIAGNOSIS — Z886 Allergy status to analgesic agent status: Secondary | ICD-10-CM | POA: Diagnosis not present

## 2019-12-20 HISTORY — DX: Dyspnea, unspecified: R06.00

## 2019-12-20 HISTORY — PX: IR PARACENTESIS: IMG2679

## 2019-12-20 HISTORY — DX: Cardiac murmur, unspecified: R01.1

## 2019-12-20 LAB — CBC
HCT: 27.4 % — ABNORMAL LOW (ref 36.0–46.0)
Hemoglobin: 8.9 g/dL — ABNORMAL LOW (ref 12.0–15.0)
MCH: 29.8 pg (ref 26.0–34.0)
MCHC: 32.5 g/dL (ref 30.0–36.0)
MCV: 91.6 fL (ref 80.0–100.0)
Platelets: 115 10*3/uL — ABNORMAL LOW (ref 150–400)
RBC: 2.99 MIL/uL — ABNORMAL LOW (ref 3.87–5.11)
RDW: 15.1 % (ref 11.5–15.5)
WBC: 6.2 10*3/uL (ref 4.0–10.5)
nRBC: 0 % (ref 0.0–0.2)

## 2019-12-20 LAB — COMPREHENSIVE METABOLIC PANEL
ALT: 26 U/L (ref 0–44)
AST: 35 U/L (ref 15–41)
Albumin: 2.7 g/dL — ABNORMAL LOW (ref 3.5–5.0)
Alkaline Phosphatase: 124 U/L (ref 38–126)
Anion gap: 11 (ref 5–15)
BUN: 41 mg/dL — ABNORMAL HIGH (ref 6–20)
CO2: 12 mmol/L — ABNORMAL LOW (ref 22–32)
Calcium: 8.6 mg/dL — ABNORMAL LOW (ref 8.9–10.3)
Chloride: 104 mmol/L (ref 98–111)
Creatinine, Ser: 1.79 mg/dL — ABNORMAL HIGH (ref 0.44–1.00)
GFR calc Af Amer: 36 mL/min — ABNORMAL LOW (ref >60)
GFR calc non Af Amer: 31 mL/min — ABNORMAL LOW (ref >60)
Glucose, Bld: 145 mg/dL — ABNORMAL HIGH (ref 70–99)
Potassium: 4.9 mmol/L (ref 3.5–5.1)
Sodium: 127 mmol/L — ABNORMAL LOW (ref 135–145)
Total Bilirubin: 2.9 mg/dL — ABNORMAL HIGH (ref 0.3–1.2)
Total Protein: 5.9 g/dL — ABNORMAL LOW (ref 6.5–8.1)

## 2019-12-20 LAB — GRAM STAIN

## 2019-12-20 LAB — BODY FLUID CELL COUNT WITH DIFFERENTIAL
Lymphs, Fluid: 45 %
Monocyte-Macrophage-Serous Fluid: 52 % (ref 50–90)
Neutrophil Count, Fluid: 2 % (ref 0–25)
Other Cells, Fluid: 1 %
Total Nucleated Cell Count, Fluid: 23 cu mm (ref 0–1000)

## 2019-12-20 LAB — AMMONIA: Ammonia: 42 umol/L — ABNORMAL HIGH (ref 9–35)

## 2019-12-20 LAB — GLUCOSE, CAPILLARY: Glucose-Capillary: 126 mg/dL — ABNORMAL HIGH (ref 70–99)

## 2019-12-20 LAB — SARS CORONAVIRUS 2 BY RT PCR (HOSPITAL ORDER, PERFORMED IN ~~LOC~~ HOSPITAL LAB): SARS Coronavirus 2: NEGATIVE

## 2019-12-20 LAB — PREPARE RBC (CROSSMATCH)

## 2019-12-20 LAB — PROTIME-INR
INR: 1.4 — ABNORMAL HIGH (ref 0.8–1.2)
Prothrombin Time: 16.2 seconds — ABNORMAL HIGH (ref 11.4–15.2)

## 2019-12-20 SURGERY — IR WITH ANESTHESIA
Anesthesia: General

## 2019-12-20 MED ORDER — CHLORHEXIDINE GLUCONATE 0.12 % MT SOLN
OROMUCOSAL | Status: AC
  Administered 2019-12-20: 15 mL via OROMUCOSAL
  Filled 2019-12-20: qty 15

## 2019-12-20 MED ORDER — VANCOMYCIN HCL IN DEXTROSE 1-5 GM/200ML-% IV SOLN: 1000.0000 mg | Freq: Once | INTRAVENOUS | Status: AC

## 2019-12-20 MED ORDER — LACTATED RINGERS IV SOLN: INTRAVENOUS | Status: DC

## 2019-12-20 MED ORDER — VANCOMYCIN HCL IN DEXTROSE 1-5 GM/200ML-% IV SOLN
INTRAVENOUS | Status: AC
  Administered 2019-12-20: 1000 mg via INTRAVENOUS
  Filled 2019-12-20: qty 200

## 2019-12-20 MED ORDER — LIDOCAINE HCL 1 % IJ SOLN
INTRAMUSCULAR | Status: AC
  Filled 2019-12-20: qty 20

## 2019-12-20 MED ORDER — ALBUMIN HUMAN 25 % IV SOLN
INTRAVENOUS | Status: AC
  Filled 2019-12-20: qty 200

## 2019-12-20 MED ORDER — ORAL CARE MOUTH RINSE: 15.0000 mL | Freq: Once | OROMUCOSAL | Status: AC

## 2019-12-20 MED ORDER — CHLORHEXIDINE GLUCONATE 0.12 % MT SOLN: 15.0000 mL | Freq: Once | OROMUCOSAL | Status: AC

## 2019-12-20 MED ORDER — SODIUM CHLORIDE 0.9 % IV SOLN: INTRAVENOUS | Status: DC

## 2019-12-20 MED ORDER — ALBUMIN HUMAN 25 % IV SOLN
50.0000 g | Freq: Once | INTRAVENOUS | Status: AC
  Administered 2019-12-20: 50 g via INTRAVENOUS
  Filled 2019-12-20: qty 200

## 2019-12-20 MED ORDER — SODIUM CHLORIDE 0.9% IV SOLUTION: Freq: Once | INTRAVENOUS | Status: DC

## 2019-12-20 MED ORDER — LIDOCAINE HCL 1 % IJ SOLN
INTRAMUSCULAR | Status: DC | PRN
  Administered 2019-12-20: 10 mL

## 2019-12-20 NOTE — Procedures (Signed)
PROCEDURE SUMMARY:  Successful US guided paracentesis from left lateral abdomen.  Yielded 5.0 liters of yellow fluid.  No immediate complications.  Pt tolerated well.   Specimen was sent for labs.  EBL < 68m  KDocia BarrierPA-C 12/20/2019 2:58 PM

## 2019-12-20 NOTE — H&P (Signed)
Chief Complaint: Patient was seen in consultation today for Transjugular intrahepatic portal system shunt procedure/ possible variceal embolization and paracentesis at the request of Dr Doyne Keel  Supervising Physician: Corrie Mckusick  Patient Status: [facility] - Out-pt  History of Present Illness: Meagan Peck is a 59 y.o. female   NASH Cirrhosis Refractory ascites  TIPs attempt made 11/25/19 Unsuccessful secondary unusual anatomy Since then-- MRI has been performed  12/10/19 MRI:  IMPRESSION: 1. Cirrhosis. No liver masses. 2. 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 3. Diminutive patent right and middle hepatic veins. Advanced atrophy of the lateral segment left liver lobe with atrophic left hepatic vein, poorly visualized. 4. Moderate splenomegaly. Mild gastroesophageal and paraumbilical varices. Large volume abdominal ascites.  Dr Earleen Newport has reviewed imaging and new consult performed with pt virtually 12/11/19: Meagan Peck is a very pleasant 59 yo female presenting to VIR to again discuss interval imaging and another attempt at TIPS creation as an option for her refractory ascites.  She has had interval MRI imaging, which is useful to understand her variant anatomy for future attempt.   Our previous discussion regarding TIPS in the indication for refractory ascites, as well as the implication of refractory ascites is documented in the original consult of 10/30/19.   Today we did discussed her change in MELD that was calculated, and that we can sometimes see fluctuations.  Specifically, it seems her increase is being driven by the renal dysfunction, not the liver/total bilirubin.  In these scenario, we can see TIPS help renal function recover, although we acknowledge the relative contraindication with high MELD scores.  I did let her know that I have discussed this with Dr. Havery Moros on the day  of her procedure, and we agree that proceeding is reasonable to try and help her.  She is very interested in proceeding with TIPS for relief.   Risks and benefits of TIPS,and/or additional variceal embolization have been discussed with Meagan Shepherdincluding:infection, bleeding, damage to adjacent structures, worsening hepatic and/or cardiac function, worsening and/or the development of altered mental status/encephalopathy, need for lactulose, need for additional procedure/surgery, TIPS reversal,non-target embolization, cardiopulmonary collapse,death. After our discussion, she would like to proceed with TIPS. On this next attempt, we will plan on possibly having advanced imaging techniques available such as ICE/IVUS, as well as considering maneuvers such as direct intra-hepatic portal-systemic shunt (DIPS) and/or gunsight maneuvers.   Plan: - We will plan on TIPS with Dr. Earleen Newport and general anesthesia, with large volume paracentesis, as [facility].  We will need to plan for 2 VIR operators on this date, anticipating the need for second person to assist with ICE/IVUS advanced imaging - Continue current care  MELD - not calculated yet today-- labs pending Last para 12/13/19: 5L Previous 12/05/19: 13 L  Scheduled now for TIPs with possible variceal embolization and paracentesis    Past Medical History:  Diagnosis Date  . Acute medial meniscus tear of right knee 12/10/2015  . Allergy   . Arthritis    bil feet  . Ascites   . Cataract    bilateral - surgery to remove  . Cirrhosis ([place])   . Diabetes mellitus without complication ([place])    type 2 - last a1c was 5.2 per patient, no meds  . Dyspnea    when I have too much fluid  . Esophageal varices ([place])   . GERD (gastroesophageal reflux disease)   . Heart  murmur   . Hepatic cirrhosis ([place])   . HLD (hyperlipidemia)    no meds  . Hypertension    no meds  . Iron deficiency anemia 09/26/2018    Past Surgical History:  Procedure Laterality  Date  . BIOPSY  08/20/2019   Procedure: BIOPSY;  Surgeon: Jackquline Denmark, MD;  Location: [place];  Service: Gastroenterology;;  . CARPAL TUNNEL RELEASE Right   . CESAREAN SECTION    . CHOLECYSTECTOMY    . COLONOSCOPY  08/2019  . ESOPHAGEAL BANDING N/A 09/19/2019   Procedure: ESOPHAGEAL BANDING;  Surgeon: Yetta Flock, MD;  Location: [facility];  Service: Gastroenterology;  Laterality: N/A;  . ESOPHAGOGASTRODUODENOSCOPY (EGD) WITH PROPOFOL N/A 08/20/2019   Procedure: ESOPHAGOGASTRODUODENOSCOPY (EGD) WITH PROPOFOL;  Surgeon: Jackquline Denmark, MD;  Location: [place];  Service: Gastroenterology;  Laterality: N/A;  . ESOPHAGOGASTRODUODENOSCOPY (EGD) WITH PROPOFOL N/A 09/19/2019   Procedure: ESOPHAGOGASTRODUODENOSCOPY (EGD) WITH PROPOFOL;  Surgeon: Yetta Flock, MD;  Location: [facility];  Service: Gastroenterology;  Laterality: N/A;  . EYE SURGERY Bilateral    removed cataracts  . GASTRIC VARICES BANDING N/A 08/20/2019   Procedure: GASTRIC VARICES BANDING;  Surgeon: Jackquline Denmark, MD;  Location: [place];  Service: Gastroenterology;  Laterality: N/A;  . IR PARACENTESIS  08/01/2019  . IR PARACENTESIS  08/19/2019  . IR PARACENTESIS  09/09/2019  . IR PARACENTESIS  09/23/2019  . IR PARACENTESIS  10/08/2019  . IR PARACENTESIS  10/23/2019  . IR PARACENTESIS  11/12/2019  . IR PARACENTESIS  11/21/2019  . IR PARACENTESIS  11/25/2019  . IR PARACENTESIS  12/05/2019  . IR PARACENTESIS  12/13/2019  . IR RADIOLOGIST EVAL & MGMT  10/30/2019  . IR TIPS  11/25/2019  . KNEE ARTHROSCOPY Right 12/10/2015   Procedure: RIGHT KNEE ARTHROSCOPY WITH PARTIAL MEDIAL MENISCECTOMY;  Surgeon: Mcarthur Rossetti, MD;  Location: [facility];  Service: Orthopedics;  Laterality: Right;  . KNEE ARTHROSCOPY W/ MENISCAL REPAIR Bilateral   . MENISCUS REPAIR Left   . RADIOLOGY WITH ANESTHESIA N/A 11/25/2019   Procedure: TIPS;  Surgeon: Corrie Mckusick, DO;  Location: [place];  Service: Anesthesiology;  Laterality: N/A;     Allergies: Penicillins, Atrovent [ipratropium], Naproxen, Quinine derivatives, and Sulfa antibiotics  Medications: Prior to Admission medications   Medication Sig Start Date End Date Taking? Authorizing Provider  aspirin 81 MG tablet Take 1 tablet (81 mg total) by mouth daily. Patient taking differently: Take 81 mg by mouth at bedtime.  08/30/19  Yes Kyle, Tyrone A, DO  cetirizine (ZYRTEC) 10 MG tablet Take 1 tablet (10 mg total) by mouth daily. Patient taking differently: Take 10 mg by mouth daily as needed for allergies.  01/02/19  Yes [name], DO  Cholecalciferol (VITAMIN D3) 5000 UNITS TABS Take 5,000 Units by mouth daily.    Yes [provider]  ferrous sulfate 325 (65 FE) MG EC tablet Take 1 tablet (325 mg total) by mouth 2 (two) times daily with a meal. 09/26/18  Yes [name], DO  Krill Oil 500 MG CAPS Take 500 mg by mouth daily.   Yes [provider]  magnesium oxide (MAG-OX) 400 MG tablet Take 1 tablet (400 mg total) by mouth 2 (two) times daily. 01/02/19  Yes [name], DO  ACCU-CHEK FASTCLIX LANCETS MISC Check fsbs TID 08/09/16   [provider]  mupirocin ointment (BACTROBAN) 2 % Apply 1 application topically 3 (three) times daily. Patient taking differently: Apply 1 application topically daily.  10/22/19   Kandra Nicolas,  MD     Family History  Problem Relation Age of Onset  . Hypertension Mother   . COPD Mother   . Alcoholism Father   . Heart attack Father   . Diabetes Brother   . Diabetes Paternal Grandmother   . Heart disease Paternal Grandmother   . Alcoholism Paternal Uncle   . Alcoholism Maternal Uncle   . Heart disease Maternal Grandmother   . Heart disease Maternal Grandfather   . Heart disease Paternal Grandfather     Social History   Socioeconomic History  . Marital status: Married    Spouse name: Reene Harlacher  . Number of children: 1  . Years of education: Not on file  . Highest education  level: Associate degree: academic program  Occupational History  . Not on file  Tobacco Use  . Smoking status: Never Smoker  . Smokeless tobacco: Never Used  Vaping Use  . Vaping Use: Never used  Substance and Sexual Activity  . Alcohol use: No  . Drug use: No  . Sexual activity: Yes    Partners: Male    Birth control/protection: None, Post-menopausal  Other Topics Concern  . Not on file  Social History Narrative  . Not on file   Social Determinants of Health   Financial Resource Strain:   . Difficulty of Paying Living Expenses: Not on file  Food Insecurity:   . Worried About Charity fundraiser in the Last Year: Not on file  . Ran Out of Food in the Last Year: Not on file  Transportation Needs:   . Lack of Transportation (Medical): Not on file  . Lack of Transportation (Non-Medical): Not on file  Physical Activity:   . Days of Exercise per Week: Not on file  . Minutes of Exercise per Session: Not on file  Stress:   . Feeling of Stress : Not on file  Social Connections:   . Frequency of Communication with Friends and Family: Not on file  . Frequency of Social Gatherings with Friends and Family: Not on file  . Attends Religious Services: Not on file  . Active Member of Clubs or Organizations: Not on file  . Attends Archivist Meetings: Not on file  . Marital Status: Not on file    Review of Systems: A 12 point ROS discussed and pertinent positives are indicated in the HPI above.  All other systems are negative.  Review of Systems  Constitutional: Positive for activity change, appetite change and fatigue.  HENT: Negative for trouble swallowing.   Eyes: Negative for visual disturbance.  Respiratory: Positive for shortness of breath.   Cardiovascular: Positive for leg swelling. Negative for chest pain.  Gastrointestinal: Positive for abdominal distention, abdominal pain and nausea.  Musculoskeletal: Positive for back pain.  Psychiatric/Behavioral: Negative  for behavioral problems and confusion.    Vital Signs: BP 130/62   Pulse 93   Temp 97.7 F (36.5 C) (Temporal)   Resp 18   Ht 4' 10"  (1.473 m)   Wt 150 lb (68 kg)   LMP 12/03/2013 (LMP Unknown)   SpO2 100%   BMI 31.35 kg/m   Physical Exam Vitals reviewed.  HENT:     Mouth/Throat:     Mouth: Mucous membranes are moist.  Eyes:     Extraocular Movements: Extraocular movements intact.  Cardiovascular:     Rate and Rhythm: Normal rate.     Heart sounds: Normal heart sounds.  Pulmonary:     Breath sounds: Normal  breath sounds.  Abdominal:     General: There is distension.     Tenderness: There is abdominal tenderness.  Musculoskeletal:        General: Swelling present. Normal range of motion.     Right lower leg: Edema present.     Left lower leg: Edema present.  Skin:    General: Skin is warm.     Coloration: Skin is not jaundiced.  Neurological:     Mental Status: She is alert and oriented to person, place, and time.  Psychiatric:        Behavior: Behavior normal.     Imaging: MR ABDOMEN W WO CONTRAST  Result Date: 12/11/2019 CLINICAL DATA:  [name] cirrhosis. Liver screening. Attempted TIPS 11/25/2019. EXAM: MRI ABDOMEN WITHOUT AND WITH CONTRAST TECHNIQUE: Multiplanar multisequence MR imaging of the abdomen was performed both before and after the administration of intravenous contrast. CONTRAST:  68m GADAVIST GADOBUTROL 1 MMOL/ML IV SOLN COMPARISON:  None. FINDINGS: Lower chest: No acute abnormality at the lung bases. Hepatobiliary: Diffusely irregular liver surface compatible with cirrhosis. Advanced atrophy of the lateral segment left liver lobe. No hepatic steatosis. No liver masses. No arterial foci of liver hyperenhancement. Cholecystectomy. No biliary ductal dilatation. Common bile duct diameter 3 mm. No choledocholithiasis. No biliary masses, strictures or beading. Pancreas: No pancreatic mass or duct dilation.  No pancreas divisum. Spleen: Moderate splenomegaly.  Craniocaudal splenic length 15.2 cm. No splenic mass. Adrenals/Urinary Tract: Normal adrenals. No hydronephrosis. Normal kidneys with no renal mass. Stomach/Bowel: Normal non-distended stomach. Visualized small and large bowel is normal caliber, with no bowel wall thickening. Vascular/Lymphatic: Normal caliber abdominal aorta. Patent renal and splenic veins. The right and middle hepatic veins appeared diminutive and patent. The left hepatic vein is atrophic and not well visualized. Nonocclusive thrombosis of entire main portal vein with thrombus extending into the very proximal portions of the right and left portal veins. Patent SMV. Mild gastroesophageal and paraumbilical varices. No pathologically enlarged lymph nodes in the abdomen. Other: Large volume abdominal ascites.  No focal fluid collection. Musculoskeletal: No aggressive appearing focal osseous lesions. IMPRESSION: 1. Cirrhosis. No liver masses. 2. 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 3. Diminutive patent right and middle hepatic veins. Advanced atrophy of the lateral segment left liver lobe with atrophic left hepatic vein, poorly visualized. 4. Moderate splenomegaly. Mild gastroesophageal and paraumbilical varices. Large volume abdominal ascites. Electronically Signed   By: JIlona SorrelM.D.   On: 12/11/2019 09:16   IR Tips  Result Date: 11/25/2019 CLINICAL DATA:  59year old female with a history of portal hypertension, refractory ascites, presenting for tips EXAM: ULTRASOUND-GUIDED PARACENTESIS ABORTED TIPS ULTRASOUND-GUIDED ACCESS RIGHT INTERNAL JUGULAR VEIN MEDICATIONS: Clindamycin antibiotics ANESTHESIA/SEDATION: General - as administered by the Anesthesia department The patient was continuously monitored during the procedure by the interventional radiology nurse under my direct supervision. CONTRAST:  457mOMNIPAQUE IOHEXOL 300 MG/ML SOLN, 4433mMNIPAQUE  IOHEXOL 300 MG/ML SOLN FLUOROSCOPY TIME:  Fluoroscopy Time: 31 minutes 6 seconds (602 mGy). COMPLICATIONS: None PROCEDURE: Informed written consent was obtained from the patient's family after a thorough discussion of the procedural risks, benefits and alternatives. Specific risks discussed with TIPS/variceal embolization included: Bleeding, infection, vascular injury, need for further procedure/surgery, renal injury/renal failure, contrast reaction, non-target embolization, liver dysfunction/failure, hepatic encephalopathy, stroke (~1%), cardiopulmonary collapse, death. All questions were addressed. Maximal Sterile Barrier Technique was utilized including caps, mask, sterile gowns, sterile gloves, sterile drape, hand  hygiene and skin antiseptic. A timeout was performed prior to the initiation of the procedure. Patient was positioned supine position on the table, with the right upper quadrant and the right neck prepped and draped in the usual sterile fashion. Ultrasound survey of the right upper quadrant was performed, with images stored and sent to PACs. Ultrasound images of the abdomen performed 2 identifies safe site for paracentesis. A small stab incision was made, and then a Safe-T-Centesis kit was advanced into the abdomen. We initiated large volume paracentesis with vacuo-tainer. Using ultrasound guidance, Chiba needle was used access the right portal system. Once we confirmed position in the portal system with portal venography, micro wire was advanced into the inferior mesenteric vein. The inner dilator was advanced over the metal stiffener into the portal system, with the inner dilator advanced over the wire. Venogram confirmed are location within the portal system. The micro wire was then replaced into the inner dilator, with the transition point position at the apex of the catheter. Check flow vial was placed on the back and of the inner dilator. Ultrasound survey was then performed of the right neck,  with images stored and sent to PACs. Ultrasound guidance was then used to access the right internal jugular vein with a micro puncture kit. The wire was advanced under fluoroscopy into the right atrium, and a small incision was made. The needle was removed and the dilator was placed. The micro wire in the stiffener were removed and an 035 wire was then passed into the inferior vena cava. Ten French soft tissue dilation was performed on the wire, and then 10 [place] TIPS sheath was placed. Combination of Benson wire and multipurpose angled catheter were used to select several right hepatic vein. Initially we elected the largest and most caudal oriented hepatic vein for attempted trans a Paddock tract. Sheath was then advanced over the catheter with a coaxial wire, for a position cephalad to the transition point of the wire in the portal venous system. Simultaneous venogram of the portal system and the hepatic venous system was performed both in a frontal projection and a right anterior oblique projection. The Colapinto needle was then advanced through the TIPS sheath housed in the Teflon sheath over the Amplatz wire. Using frontal and oblique projections, approximately 3 passes were attempted to access the portal venous system. Given the orientation of the portal system and the hepatic vein, we then withdrew the colapinto needle in attempt to find a more cephalad hepatic vein for access. Two additional hepatic veins were selected. Neither of these was large enough or with an orientation that would allow passage of the tips sheath. Thus, we decided to return to the first and largest of the hepatic veins for additional attempts for trans a Paddock tract to the right portal system. Multiple attempts at passing the colapinto needle were unsuccessful in entering the portal system. Ultimately we decided to withdraw at 32 minutes of fluoro time, having not identified an adequate angle from hepatic vein to portal vein tract.  All catheters and wires were removed with manual pressure for hemostasis. Patient tolerated the procedure well and remained hemodynamically stable throughout. No complications were encountered and no significant blood loss. IMPRESSION: Status post ultrasound-guided paracentesis, ultrasound-guided right IJ access, ultrasound-guided percutaneous transhepatic portal access, for TIPS attempt, which was ultimately unsuccessful. Signed, Dulcy Fanny. Dellia Nims, RPVI [facility] PLAN: The patient will be observed overnight after recovery in the PACU. Patient will follow-up in  [facility], and we will discuss noncontrast MR or CT pending another attempt for TIPS Continue large volume paracentesis as needed Electronically Signed   By: Corrie Mckusick D.O.   On: 11/25/2019 11:17   IR Paracentesis  Result Date: 12/17/2019 INDICATION: Patient history of cirrhosis with recurrent ascites and portal hypertension. Request is for therapeutic paracentesis. Maximum of 5 L. EXAM: ULTRASOUND GUIDED THERAPEUTIC AND DIAGNOSTIC PARACENTESIS MEDICATIONS: Lidocaine 1% 10 mL COMPLICATIONS: None immediate. PROCEDURE: Informed written consent was obtained from the patient after a discussion of the risks, benefits and alternatives to treatment. A timeout was performed prior to the initiation of the procedure. Initial ultrasound scanning demonstrates a large amount of ascites within the right lower abdominal quadrant. The right lower abdomen was prepped and draped in the usual sterile fashion. 1% lidocaine was used for local anesthesia. Following this, a 6 Fr Safe-T-Centesis catheter was introduced. An ultrasound image was saved for documentation purposes. The paracentesis was performed. The catheter was removed and a dressing was applied. The patient tolerated the procedure well without immediate post procedural complication. Patient received post-procedure intravenous albumin; see nursing  notes for details. FINDINGS: A total of approximately 5 L of straw-colored fluid was removed. Samples were sent to the laboratory as requested by the clinical team. IMPRESSION: Successful ultrasound-guided therapeutic and diagnostic paracentesis yielding 5 liters of peritoneal fluid. Read by: Rushie Nyhan, NP Electronically Signed   By: Aletta Edouard M.D.   On: 12/17/2019 07:51   IR Paracentesis  Result Date: 12/05/2019 INDICATION: Patient with history of portal hypertension, refractory ascites, cirrhosis by imaging; request received for therapeutic paracentesis. EXAM: ULTRASOUND GUIDED THERAPEUTIC PARACENTESIS MEDICATIONS: 1% lidocaine to skin and subcutaneous tissue COMPLICATIONS: None immediate. PROCEDURE: Informed written consent was obtained from the patient after a discussion of the risks, benefits and alternatives to treatment. A timeout was performed prior to the initiation of the procedure. Initial ultrasound scanning demonstrates a large amount of ascites within the right lower abdominal quadrant. The right lower abdomen was prepped and draped in the usual sterile fashion. 1% lidocaine was used for local anesthesia. Following this, a 19 gauge, 10-cm, Yueh catheter was introduced. An ultrasound image was saved for documentation purposes. The paracentesis was performed. The catheter was removed and a dressing was applied. The patient tolerated the procedure well without immediate post procedural complication. Patient received post-procedure intravenous albumin; see nursing notes for details. FINDINGS: A total of approximately 13 liters of yellow fluid was removed. IMPRESSION: Successful ultrasound-guided therapeutic paracentesis yielding 13 liters of peritoneal fluid. Read by: Lindaann Pascal Electronically Signed   By: Sandi Mariscal M.D.   On: 12/05/2019 10:34   IR Paracentesis  Result Date: 11/25/2019 CLINICAL DATA:  59 year old female with a history of portal hypertension, refractory  ascites, presenting for tips EXAM: ULTRASOUND-GUIDED PARACENTESIS ABORTED TIPS ULTRASOUND-GUIDED ACCESS RIGHT INTERNAL JUGULAR VEIN MEDICATIONS: Clindamycin antibiotics ANESTHESIA/SEDATION: General - as administered by the Anesthesia department The patient was continuously monitored during the procedure by the interventional radiology nurse under my direct supervision. CONTRAST:  58m OMNIPAQUE IOHEXOL 300 MG/ML SOLN, 428mOMNIPAQUE IOHEXOL 300 MG/ML SOLN FLUOROSCOPY TIME:  Fluoroscopy Time: 31 minutes 6 seconds (602 mGy). COMPLICATIONS: None PROCEDURE: Informed written consent was obtained from the patient's family after a thorough discussion of the procedural risks, benefits and alternatives. Specific risks discussed with TIPS/variceal embolization included: Bleeding, infection, vascular injury, need for further procedure/surgery, renal injury/renal failure, contrast reaction, non-target embolization, liver dysfunction/failure, hepatic encephalopathy, stroke (~1%), cardiopulmonary collapse, death. All questions were addressed.  Maximal Sterile Barrier Technique was utilized including caps, mask, sterile gowns, sterile gloves, sterile drape, hand hygiene and skin antiseptic. A timeout was performed prior to the initiation of the procedure. Patient was positioned supine position on the table, with the right upper quadrant and the right neck prepped and draped in the usual sterile fashion. Ultrasound survey of the right upper quadrant was performed, with images stored and sent to PACs. Ultrasound images of the abdomen performed 2 identifies safe site for paracentesis. A small stab incision was made, and then a Safe-T-Centesis kit was advanced into the abdomen. We initiated large volume paracentesis with vacuo-tainer. Using ultrasound guidance, Chiba needle was used access the right portal system. Once we confirmed position in the portal system with portal venography, micro wire was advanced into the inferior  mesenteric vein. The inner dilator was advanced over the metal stiffener into the portal system, with the inner dilator advanced over the wire. Venogram confirmed are location within the portal system. The micro wire was then replaced into the inner dilator, with the transition point position at the apex of the catheter. Check flow vial was placed on the back and of the inner dilator. Ultrasound survey was then performed of the right neck, with images stored and sent to PACs. Ultrasound guidance was then used to access the right internal jugular vein with a micro puncture kit. The wire was advanced under fluoroscopy into the right atrium, and a small incision was made. The needle was removed and the dilator was placed. The micro wire in the stiffener were removed and an 035 wire was then passed into the inferior vena cava. Ten French soft tissue dilation was performed on the wire, and then 10 [place] TIPS sheath was placed. Combination of Benson wire and multipurpose angled catheter were used to select several right hepatic vein. Initially we elected the largest and most caudal oriented hepatic vein for attempted trans a Paddock tract. Sheath was then advanced over the catheter with a coaxial wire, for a position cephalad to the transition point of the wire in the portal venous system. Simultaneous venogram of the portal system and the hepatic venous system was performed both in a frontal projection and a right anterior oblique projection. The Colapinto needle was then advanced through the TIPS sheath housed in the Teflon sheath over the Amplatz wire. Using frontal and oblique projections, approximately 3 passes were attempted to access the portal venous system. Given the orientation of the portal system and the hepatic vein, we then withdrew the colapinto needle in attempt to find a more cephalad hepatic vein for access. Two additional hepatic veins were selected. Neither of these was large enough or with an  orientation that would allow passage of the tips sheath. Thus, we decided to return to the first and largest of the hepatic veins for additional attempts for trans a Paddock tract to the right portal system. Multiple attempts at passing the colapinto needle were unsuccessful in entering the portal system. Ultimately we decided to withdraw at 32 minutes of fluoro time, having not identified an adequate angle from hepatic vein to portal vein tract. All catheters and wires were removed with manual pressure for hemostasis. Patient tolerated the procedure well and remained hemodynamically stable throughout. No complications were encountered and no significant blood loss. IMPRESSION: Status post ultrasound-guided paracentesis, ultrasound-guided right IJ access, ultrasound-guided percutaneous transhepatic portal access, for TIPS attempt, which was ultimately unsuccessful. Signed, Dulcy Fanny. Dellia Nims, [facility]  PLAN: The patient will be observed overnight after recovery in the PACU. Patient will follow-up in [facility], and we will discuss noncontrast MR or CT pending another attempt for TIPS Continue large volume paracentesis as needed Electronically Signed   By: Corrie Mckusick D.O.   On: 11/25/2019 11:17   IR Paracentesis  Result Date: 11/21/2019 INDICATION: Recurrent large volume ascites secondary to hepatic cirrhosis. Request for therapeutic paracentesis up to 7 L. EXAM: ULTRASOUND GUIDED PARACENTESIS MEDICATIONS: 1% lidocaine 10 mL COMPLICATIONS: None immediate. PROCEDURE: Informed written consent was obtained from the patient after a discussion of the risks, benefits and alternatives to treatment. A timeout was performed prior to the initiation of the procedure. Initial ultrasound scanning demonstrates a large amount of ascites within the right lower abdominal quadrant. The right lower abdomen was prepped and draped in the usual sterile fashion. 1%  lidocaine was used for local anesthesia. Following this, a 19 gauge, 7-cm, Yueh catheter was introduced. An ultrasound image was saved for documentation purposes. The paracentesis was performed. The catheter was removed and a dressing was applied. The patient tolerated the procedure well without immediate post procedural complication. FINDINGS: A total of approximately 7 L of clear yellow fluid was removed. IMPRESSION: Successful ultrasound-guided paracentesis yielding 7 liters of peritoneal fluid. Read by: Gareth Eagle, PA-C Electronically Signed   By: Corrie Mckusick D.O.   On: 11/21/2019 11:21    Labs:  CBC: Recent Labs    08/17/19 0556 08/17/19 0556 08/18/19 0519 08/18/19 0519 08/20/19 0251 08/20/19 0251 09/19/19 1307 11/25/19 0630 11/25/19 0852 11/25/19 0958  WBC 4.7  --  3.5*  --  3.2*  --   --  7.7  --   --   HGB 9.6*   < > 9.1*   < > 9.1*   < > 11.2* 9.1* 7.5* 8.8*  HCT 28.5*   < > 27.1*   < > 27.2*   < > 33.0* 27.7* 22.0* 26.0*  PLT 109*  --  87*  --  83*  --   --  153  --   --    < > = values in this interval not displayed.    COAGS: Recent Labs    07/31/19 1154 08/16/19 0014 10/16/19 1720 11/25/19 0630  INR 1.4* 1.3* 1.3* 1.5*  APTT  --   --   --  35    BMP: Recent Labs    08/19/19 1314 08/19/19 1314 08/20/19 0251 08/29/19 1529 10/23/19 1616 11/08/19 1454 11/11/19 1646 11/11/19 1646 11/19/19 1640 11/19/19 1640 11/25/19 0630 11/25/19 0852 11/25/19 0958 12/11/19 1608  NA 131*   < > 133*   < > 130*   < > 125*   < > 123*   < > 130* 132* 131* 128*  K 4.7   < > 4.9   < > 4.6   < > 5.2 No hemolysis seen*   < > 5.2*   < > 4.8 4.8 5.3* 5.3 No hemolysis seen*  CL 102   < > 104   < > 102   < > 99  --  98  --  104  --   --  104  CO2 19*   < > 22   < > 19*   < > 18*  --  20  --  17*  --   --  16*  GLUCOSE 155*   < > 102*   < > 157*   < > 131*  --  142*  --  149*  --   --  128*  BUN 20   < > 21*   < > 29*   < > 31*  --  29*  --  28*  --   --  33*  CALCIUM 8.2*   <  > 8.2*   < > 9.4   < > 8.5  --  8.4  --  8.3*  --   --  8.8  CREATININE 0.91   < > 0.94   < > 1.37*   < > 1.56*  --  1.38*  --  1.96*  --   --  1.49*  GFRNONAA >60  --  >60  --  43*  --   --   --   --   --  28*  --   --   --   GFRAA >60  --  >60  --  50*  --   --   --   --   --  32*  --   --   --    < > = values in this interval not displayed.    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 MRI performed to review anatomy She has had second consult with Dr Earleen Newport Scheduled now for TIPs with possible variceal embolization and paracentesis Risks and benefits of TIPS, BRTO and/or additional variceal embolization were discussed with the patient and/or the patient's family including, but not limited to, infection, bleeding, damage to adjacent structures, worsening hepatic and/or cardiac function, worsening and/or the development of altered mental status/encephalopathy, non-target embolization and death.   This interventional procedure involves the use of X-rays and because of the nature of the planned procedure, it is possible that we will have prolonged use of X-ray fluoroscopy.  Potential radiation risks to you include (but are not limited to) the following: - A slightly elevated risk for cancer  several years later in life. This risk is typically less than 0.5% percent. This risk is low in comparison to the normal incidence of human cancer, which is 33% for women and 50% for men according to the [place]. - Radiation induced injury can include skin redness, resembling a rash, tissue breakdown / ulcers  and hair loss (which can be temporary or permanent).   The likelihood of either of these occurring depends on the difficulty of the procedure and whether you are sensitive to radiation due to previous procedures, disease, or genetic conditions.   IF your procedure requires a prolonged use of radiation, you will be notified and given written instructions for further action.  It is your responsibility to monitor the irradiated area for the 2 weeks following the procedure and to notify your physician if you are concerned that you have suffered a radiation induced injury.    All of the patient's questions were answered, patient is agreeable to proceed.  Consent signed and in chart.  Thank you for this interesting consult.  I greatly enjoyed meeting Nelly P Russom and look forward to participating in their care.  A copy of this report was sent to the requesting provider on this date.  Electronically Signed: Lavonia Drafts, PA-C 12/20/2019, 6:54 AM   I spent a total of    25 Minutes in face to face in clinical consultation, greater than 50% of which was counseling/coordinating care for TIPs

## 2019-12-23 ENCOUNTER — Other Ambulatory Visit: Payer: Self-pay | Admitting: Student

## 2019-12-23 ENCOUNTER — Telehealth: Payer: Self-pay | Admitting: Gastroenterology

## 2019-12-23 DIAGNOSIS — R188 Other ascites: Secondary | ICD-10-CM

## 2019-12-23 DIAGNOSIS — K7460 Unspecified cirrhosis of liver: Secondary | ICD-10-CM

## 2019-12-23 NOTE — Telephone Encounter (Signed)
Thanks [name]. I think that is okay. She is due for another BMET in 2 weeks or so. They could not do TIPS, I think IR is planning for a [place] Shunt as an alternative treatment. I would like to refer her to Hepatology for transplant evaluation just in case she worsens over time as she is currently not a candidate for TIPS. Can you let her know I would like their opinion on her case, and refer her to Dr. Precious Gilding office? Thanks

## 2019-12-23 NOTE — Telephone Encounter (Signed)
Referral sent to [facility] - Dr. Precious Gilding office. Patient informed via phone. She will have her PCP place referral which is required by her insurance.

## 2019-12-23 NOTE — Telephone Encounter (Signed)
Called and spoke to Patient. She was UNABLE to have TIPS done (she believes due to her MELD score?). Dr. Earleen Newport wants her to have paracentesis weekly.  I scheduled pt for paracentesis at [facility] on  Thursday, 8-26 at 10:00am,  Tuesday, 8-31 at 10:00, to arrive at 9:45am; Tuesday, 9-7 at 10:00am; Tuesday 9-14 at 10:00 and Tuesday, 9-21 at 10:00am. Patient informed.

## 2019-12-23 NOTE — Telephone Encounter (Signed)
Great thank you!

## 2019-12-24 ENCOUNTER — Ambulatory Visit
Admit: 2019-12-24 | Discharge: 2019-12-24 | Disposition: A | Discharge status: Home / Self Care | Payer: No Typology Code available for payment source | Attending: Student | Admitting: Student

## 2019-12-24 DIAGNOSIS — R188 Other ascites: Secondary | ICD-10-CM

## 2019-12-24 DIAGNOSIS — K7460 Unspecified cirrhosis of liver: Secondary | ICD-10-CM

## 2019-12-24 HISTORY — PX: IR RADIOLOGIST EVAL & MGMT: IMG5224

## 2019-12-24 LAB — TYPE AND SCREEN
ABO/RH(D): A POS
Antibody Screen: NEGATIVE
Unit division: 0
Unit division: 0

## 2019-12-24 LAB — BPAM RBC
Blood Product Expiration Date: 202109112359
Blood Product Expiration Date: 202109112359
Unit Type and Rh: 6200
Unit Type and Rh: 6200

## 2019-12-24 LAB — PATHOLOGIST SMEAR REVIEW

## 2019-12-24 NOTE — Progress Notes (Signed)
Chief Complaint: Portal HTN, Ascites  Referring Physician(s): Dr. Havery Moros  History of Present Illness: Meagan Peck is a very pleasant 59 y.o. female presenting as a follow up with [facility] today.   Meagan Peck joins [place] virtually, given COVID.  We confirmed her identity with 2 personal identifiers.   She has a history of cryptogenic/NASH cirrhosis, with refractory ascites requiring LVP's for management.   We first met her 10/30/2019, when she was referred for TIPS evaluation.    Since August 01, 2019, she has undergone many LVP's.  Her most recent was 12/20/19, with 5L.    We did attempt TIPS on 11/25/19, and discovered variant hepatic venous anatomy that was confirmed on subsequent MRI.   While she returned to [place] for another TIPS attempt recently, her labs revealed deterioration of her MELD score, which was calculated as 27.  We determined that we should defer TIPS, as the renal function was not just problematic, but she was tending up from Tbili standpoint.    She continues to work daily from home in [place], employed by [employer].  She completes her ADL's, though as her ascites increases it is more difficult.    We discussed a denver shunt/peritonealvenous shunt briefly while she was at [facility] recently, and she returns today today to further discuss.  ECHO performed 07/25/19 shows normal LV EF 60-65%, and normal right ventricle. Mild MVR, with no stenosis.   We sent the peritoneal fluid for culture and cell count on 12/20/19, and these are negative for signs of SBP.       Past Medical History:  Diagnosis Date  . Acute medial meniscus tear of right knee 12/10/2015  . Allergy   . Arthritis    bil feet  . Ascites   . Cataract    bilateral - surgery to remove  . Cirrhosis ([place])   . Diabetes mellitus without complication ([place])    type 2 - last a1c was 5.2 per patient, no meds  . Dyspnea    when I have too much fluid  . Esophageal varices ([place])   . GERD  (gastroesophageal reflux disease)   . Heart murmur   . Hepatic cirrhosis ([place])   . HLD (hyperlipidemia)    no meds  . Hypertension    no meds  . Iron deficiency anemia 09/26/2018    Past Surgical History:  Procedure Laterality Date  . BIOPSY  08/20/2019   Procedure: BIOPSY;  Surgeon: Jackquline Denmark, MD;  Location: [facility];  Service: Gastroenterology;;  . CARPAL TUNNEL RELEASE Right   . CESAREAN SECTION    . CHOLECYSTECTOMY    . COLONOSCOPY  08/2019  . ESOPHAGEAL BANDING N/A 09/19/2019   Procedure: ESOPHAGEAL BANDING;  Surgeon: Yetta Flock, MD;  [facility];  Service: Gastroenterology;  Laterality: N/A;  . ESOPHAGOGASTRODUODENOSCOPY (EGD) WITH PROPOFOL N/A 08/20/2019   Procedure: ESOPHAGOGASTRODUODENOSCOPY (EGD) WITH PROPOFOL;  Surgeon: Jackquline Denmark, MD;  Location: [facility];  Service: Gastroenterology;  Laterality: N/A;  . ESOPHAGOGASTRODUODENOSCOPY (EGD) WITH PROPOFOL N/A 09/19/2019   Procedure: ESOPHAGOGASTRODUODENOSCOPY (EGD) WITH PROPOFOL;  Surgeon: Yetta Flock, MD;  [facility];  Service: Gastroenterology;  Laterality: N/A;  . EYE SURGERY Bilateral    removed cataracts  . GASTRIC VARICES BANDING N/A 08/20/2019   Procedure: GASTRIC VARICES BANDING;  Surgeon: Jackquline Denmark, MD;  Location: [facility];  Service: Gastroenterology;  Laterality: N/A;  . IR PARACENTESIS  08/01/2019  . IR PARACENTESIS  08/19/2019  . IR PARACENTESIS  09/09/2019  . IR PARACENTESIS  09/23/2019  . IR PARACENTESIS  10/08/2019  . IR PARACENTESIS  10/23/2019  . IR PARACENTESIS  11/12/2019  . IR PARACENTESIS  11/21/2019  . IR PARACENTESIS  11/25/2019  . IR PARACENTESIS  12/05/2019  . IR PARACENTESIS  12/13/2019  . IR PARACENTESIS  12/20/2019  . IR RADIOLOGIST EVAL & MGMT  10/30/2019  . IR RADIOLOGIST EVAL & MGMT  12/11/2019  . IR TIPS  11/25/2019  . KNEE ARTHROSCOPY Right 12/10/2015   Procedure: RIGHT KNEE ARTHROSCOPY WITH PARTIAL MEDIAL MENISCECTOMY;  Surgeon: Mcarthur Rossetti, MD;  [facility];  Service: Orthopedics;  Laterality: Right;  . KNEE ARTHROSCOPY W/ MENISCAL REPAIR Bilateral   . MENISCUS REPAIR Left   . RADIOLOGY WITH ANESTHESIA N/A 11/25/2019   Procedure: TIPS;  Surgeon: Corrie Mckusick, DO;  Location: [facility];  Service: Anesthesiology;  Laterality: N/A;    Allergies: Penicillins, Atrovent [ipratropium], Naproxen, Quinine derivatives, and Sulfa antibiotics  Medications: Prior to Admission medications   Medication Sig Start Date End Date Taking? Authorizing Provider  ACCU-CHEK FASTCLIX LANCETS MISC Check fsbs TID 08/09/16   [provider]  aspirin 81 MG tablet Take 1 tablet (81 mg total) by mouth daily. Patient taking differently: Take 81 mg by mouth at bedtime.  08/30/19   Cherylann Ratel A, DO  cetirizine (ZYRTEC) 10 MG tablet Take 1 tablet (10 mg total) by mouth daily. Patient taking differently: Take 10 mg by mouth daily as needed for allergies.  01/02/19   [name], DO  Cholecalciferol (VITAMIN D3) 5000 UNITS TABS Take 5,000 Units by mouth daily.     [provider]  ferrous sulfate 325 (65 FE) MG EC tablet Take 1 tablet (325 mg total) by mouth 2 (two) times daily with a meal. 09/26/18   [name], DO  Krill Oil 500 MG CAPS Take 500 mg by mouth daily.    [provider]  magnesium oxide (MAG-OX) 400 MG tablet Take 1 tablet (400 mg total) by mouth 2 (two) times daily. 01/02/19   [name], DO  mupirocin ointment (BACTROBAN) 2 % Apply 1 application topically 3 (three) times daily. Patient taking differently: Apply 1 application topically daily.  10/22/19   Kandra Nicolas, MD     Family History  Problem Relation Age of Onset  . Hypertension Mother   . COPD Mother   . Alcoholism Father   . Heart attack Father   . Diabetes Brother   . Diabetes Paternal Grandmother   . Heart disease Paternal Grandmother   . Alcoholism Paternal Uncle   . Alcoholism Maternal Uncle   . Heart disease  Maternal Grandmother   . Heart disease Maternal Grandfather   . Heart disease Paternal Grandfather     Social History   Socioeconomic History  . Marital status: Married    Spouse name: Amarah Brossman  . Number of children: 1  . Years of education: Not on file  . Highest education level: Associate degree: academic program  Occupational History  . Not on file  Tobacco Use  . Smoking status: Never Smoker  . Smokeless tobacco: Never Used  Vaping Use  . Vaping Use: Never used  Substance and Sexual Activity  . Alcohol use: No  . Drug use: No  . Sexual activity: Yes    Partners: Male    Birth control/protection: None, Post-menopausal  Other Topics Concern  . Not on file  Social History Narrative  . Not on file  Social Determinants of Health   Financial Resource Strain:   . Difficulty of Paying Living Expenses: Not on file  Food Insecurity:   . Worried About Charity fundraiser in the Last Year: Not on file  . Ran Out of Food in the Last Year: Not on file  Transportation Needs:   . Lack of Transportation (Medical): Not on file  . Lack of Transportation (Non-Medical): Not on file  Physical Activity:   . Days of Exercise per Week: Not on file  . Minutes of Exercise per Session: Not on file  Stress:   . Feeling of Stress : Not on file  Social Connections:   . Frequency of Communication with Friends and Family: Not on file  . Frequency of Social Gatherings with Friends and Family: Not on file  . Attends Religious Services: Not on file  . Active Member of Clubs or Organizations: Not on file  . Attends Archivist Meetings: Not on file  . Marital Status: Not on file      Review of Systems  Review of Systems: A 12 point ROS discussed and pertinent positives are indicated in the HPI above.  All other systems are negative.  Physical Exam No direct physical exam was performed (except for noted visual exam findings with Video Visits).    Vital Signs: LMP  12/03/2013 (LMP Unknown)   Imaging: MR ABDOMEN W WO CONTRAST  Result Date: 12/11/2019 CLINICAL DATA:  Meagan Peck cirrhosis. Liver screening. Attempted TIPS 11/25/2019. EXAM: MRI ABDOMEN WITHOUT AND WITH CONTRAST TECHNIQUE: Multiplanar multisequence MR imaging of the abdomen was performed both before and after the administration of intravenous contrast. CONTRAST:  13m GADAVIST GADOBUTROL 1 MMOL/ML IV SOLN COMPARISON:  None. FINDINGS: Lower chest: No acute abnormality at the lung bases. Hepatobiliary: Diffusely irregular liver surface compatible with cirrhosis. Advanced atrophy of the lateral segment left liver lobe. No hepatic steatosis. No liver masses. No arterial foci of liver hyperenhancement. Cholecystectomy. No biliary ductal dilatation. Common bile duct diameter 3 mm. No choledocholithiasis. No biliary masses, strictures or beading. Pancreas: No pancreatic mass or duct dilation.  No pancreas divisum. Spleen: Moderate splenomegaly. Craniocaudal splenic length 15.2 cm. No splenic mass. Adrenals/Urinary Tract: Normal adrenals. No hydronephrosis. Normal kidneys with no renal mass. Stomach/Bowel: Normal non-distended stomach. Visualized small and large bowel is normal caliber, with no bowel wall thickening. Vascular/Lymphatic: Normal caliber abdominal aorta. Patent renal and splenic veins. The right and middle hepatic veins appeared diminutive and patent. The left hepatic vein is atrophic and not well visualized. Nonocclusive thrombosis of entire main portal vein with thrombus extending into the very proximal portions of the right and left portal veins. Patent SMV. Mild gastroesophageal and paraumbilical varices. No pathologically enlarged lymph nodes in the abdomen. Other: Large volume abdominal ascites.  No focal fluid collection. Musculoskeletal: No aggressive appearing focal osseous lesions. IMPRESSION: 1. Cirrhosis. No liver masses. 2. Nonocclusive thrombosis of the main portal vein extending into the very  proximal portions of the right and left portal veins. 3. Diminutive patent right and middle hepatic veins. Advanced atrophy of the lateral segment left liver lobe with atrophic left hepatic vein, poorly visualized. 4. Moderate splenomegaly. Mild gastroesophageal and paraumbilical varices. Large volume abdominal ascites. Electronically Signed   By: JIlona SorrelM.D.   On: 12/11/2019 09:16   IR Tips  Result Date: 11/25/2019 CLINICAL DATA:  59year old female with a history of portal hypertension, refractory ascites, presenting for tips EXAM: ULTRASOUND-GUIDED PARACENTESIS ABORTED TIPS ULTRASOUND-GUIDED ACCESS RIGHT INTERNAL JUGULAR  VEIN MEDICATIONS: Clindamycin antibiotics ANESTHESIA/SEDATION: General - as administered by the Anesthesia department The patient was continuously monitored during the procedure by the interventional radiology nurse under my direct supervision. CONTRAST:  51m OMNIPAQUE IOHEXOL 300 MG/ML SOLN, 456mOMNIPAQUE IOHEXOL 300 MG/ML SOLN FLUOROSCOPY TIME:  Fluoroscopy Time: 31 minutes 6 seconds (602 mGy). COMPLICATIONS: None PROCEDURE: Informed written consent was obtained from the patient's family after a thorough discussion of the procedural risks, benefits and alternatives. Specific risks discussed with TIPS/variceal embolization included: Bleeding, infection, vascular injury, need for further procedure/surgery, renal injury/renal failure, contrast reaction, non-target embolization, liver dysfunction/failure, hepatic encephalopathy, stroke (~1%), cardiopulmonary collapse, death. All questions were addressed. Maximal Sterile Barrier Technique was utilized including caps, mask, sterile gowns, sterile gloves, sterile drape, hand hygiene and skin antiseptic. A timeout was performed prior to the initiation of the procedure. Patient was positioned supine position on the table, with the right upper quadrant and the right neck prepped and draped in the usual sterile fashion. Ultrasound survey of  the right upper quadrant was performed, with images stored and sent to PACs. Ultrasound images of the abdomen performed 2 identifies safe site for paracentesis. A small stab incision was made, and then a Safe-T-Centesis kit was advanced into the abdomen. We initiated large volume paracentesis with vacuo-tainer. Using ultrasound guidance, Chiba needle was used access the right portal system. Once we confirmed position in the portal system with portal venography, micro wire was advanced into the inferior mesenteric vein. The inner dilator was advanced over the metal stiffener into the portal system, with the inner dilator advanced over the wire. Venogram confirmed are location within the portal system. The micro wire was then replaced into the inner dilator, with the transition point position at the apex of the catheter. Check flow vial was placed on the back and of the inner dilator. Ultrasound survey was then performed of the right neck, with images stored and sent to PACs. Ultrasound guidance was then used to access the right internal jugular vein with a micro puncture kit. The wire was advanced under fluoroscopy into the right atrium, and a small incision was made. The needle was removed and the dilator was placed. The micro wire in the stiffener were removed and an 035 wire was then passed into the inferior vena cava. Ten French soft tissue dilation was performed on the wire, and then 10 [place] sheath was placed. Combination of Benson wire and multipurpose angled catheter were used to select several right hepatic vein. Initially we elected the largest and most caudal oriented hepatic vein for attempted trans a Paddock tract. Sheath was then advanced over the catheter with a coaxial wire, for a position cephalad to the transition point of the wire in the portal venous system. Simultaneous venogram of the portal system and the hepatic venous system was performed both in a frontal projection and a right  anterior oblique projection. The Colapinto needle was then advanced through the TIPS sheath housed in the Teflon sheath over the Amplatz wire. Using frontal and oblique projections, approximately 3 passes were attempted to access the portal venous system. Given the orientation of the portal system and the hepatic vein, we then withdrew the colapinto needle in attempt to find a more cephalad hepatic vein for access. Two additional hepatic veins were selected. Neither of these was large enough or with an orientation that would allow passage of the tips sheath. Thus, we decided to return to the first and largest of the hepatic veins for additional  attempts for trans a Paddock tract to the right portal system. Multiple attempts at passing the colapinto needle were unsuccessful in entering the portal system. Ultimately we decided to withdraw at 32 minutes of fluoro time, having not identified an adequate angle from hepatic vein to portal vein tract. All catheters and wires were removed with manual pressure for hemostasis. Patient tolerated the procedure well and remained hemodynamically stable throughout. No complications were encountered and no significant blood loss. IMPRESSION: Status post ultrasound-guided paracentesis, ultrasound-guided right IJ access, ultrasound-guided percutaneous transhepatic portal access, for TIPS attempt, which was ultimately unsuccessful. Signed, Dulcy Fanny. Dellia Nims, RPVI [facility] Radiology PLAN: The patient will be observed overnight after recovery in the PACU. Patient will follow-up in [place] [facility], and we will discuss noncontrast MR or CT pending another attempt for TIPS Continue large volume paracentesis as needed Electronically Signed   By: Corrie Mckusick D.O.   On: 11/25/2019 11:17   IR Radiologist Eval & Mgmt  Result Date: 12/24/2019 Please refer to notes tab for details about interventional procedure. (Op Note)  IR  Paracentesis  Result Date: 12/20/2019 INDICATION: Patient with cirrhosis, recurrent ascites. Unable to undergo tips procedure today due to elevated meld score, proceed with diagnostic and therapeutic paracentesis. EXAM: ULTRASOUND GUIDED DIAGNOSTIC AND THERAPEUTIC PARACENTESIS MEDICATIONS: 10 mL 1% lidocaine COMPLICATIONS: None immediate. PROCEDURE: Informed written consent was obtained from the patient after a discussion of the risks, benefits and alternatives to treatment. A timeout was performed prior to the initiation of the procedure. Initial ultrasound scanning demonstrates a large amount of ascites within the left lateral abdomen. The left lateral abdomen was prepped and draped in the usual sterile fashion. 1% lidocaine was used for local anesthesia. Following this, a 19 gauge, 7-cm, Yueh catheter was introduced. An ultrasound image was saved for documentation purposes. The paracentesis was performed. The catheter was removed and a dressing was applied. The patient tolerated the procedure well without immediate post procedural complication. FINDINGS: A total of approximately 5.0 liters of yellow fluid was removed. Samples were sent to the laboratory as requested by the clinical team. IMPRESSION: Successful ultrasound-guided diagnostic and therapeutic paracentesis yielding 5.0 liters of peritoneal fluid. Read by: Brynda Greathouse PA-C Electronically Signed   By: Corrie Mckusick D.O.   On: 12/20/2019 15:00   IR Paracentesis  Result Date: 12/17/2019 INDICATION: Patient history of cirrhosis with recurrent ascites and portal hypertension. Request is for therapeutic paracentesis. Maximum of 5 L. EXAM: ULTRASOUND GUIDED THERAPEUTIC AND DIAGNOSTIC PARACENTESIS MEDICATIONS: Lidocaine 1% 10 mL COMPLICATIONS: None immediate. PROCEDURE: Informed written consent was obtained from the patient after a discussion of the risks, benefits and alternatives to treatment. A timeout was performed prior to the initiation of the  procedure. Initial ultrasound scanning demonstrates a large amount of ascites within the right lower abdominal quadrant. The right lower abdomen was prepped and draped in the usual sterile fashion. 1% lidocaine was used for local anesthesia. Following this, a 6 Fr Safe-T-Centesis catheter was introduced. An ultrasound image was saved for documentation purposes. The paracentesis was performed. The catheter was removed and a dressing was applied. The patient tolerated the procedure well without immediate post procedural complication. Patient received post-procedure intravenous albumin; see nursing notes for details. FINDINGS: A total of approximately 5 L of straw-colored fluid was removed. Samples were sent to the laboratory as requested by the clinical team. IMPRESSION: Successful ultrasound-guided therapeutic and diagnostic paracentesis yielding 5 liters of peritoneal fluid. Read by: Rushie Nyhan, NP Electronically  Signed   By: Aletta Edouard M.D.   On: 12/17/2019 07:51   IR Paracentesis  Result Date: 12/05/2019 INDICATION: Patient with history of portal hypertension, refractory ascites, cirrhosis by imaging; request received for therapeutic paracentesis. EXAM: ULTRASOUND GUIDED THERAPEUTIC PARACENTESIS MEDICATIONS: 1% lidocaine to skin and subcutaneous tissue COMPLICATIONS: None immediate. PROCEDURE: Informed written consent was obtained from the patient after a discussion of the risks, benefits and alternatives to treatment. A timeout was performed prior to the initiation of the procedure. Initial ultrasound scanning demonstrates a large amount of ascites within the right lower abdominal quadrant. The right lower abdomen was prepped and draped in the usual sterile fashion. 1% lidocaine was used for local anesthesia. Following this, a 19 gauge, 10-cm, Yueh catheter was introduced. An ultrasound image was saved for documentation purposes. The paracentesis was performed. The catheter was removed and a  dressing was applied. The patient tolerated the procedure well without immediate post procedural complication. Patient received post-procedure intravenous albumin; see nursing notes for details. FINDINGS: A total of approximately 13 liters of yellow fluid was removed. IMPRESSION: Successful ultrasound-guided therapeutic paracentesis yielding 13 liters of peritoneal fluid. Read by: Lindaann Pascal Electronically Signed   By: Sandi Mariscal M.D.   On: 12/05/2019 10:34   IR Paracentesis  Result Date: 11/25/2019 CLINICAL DATA:  59 year old female with a history of portal hypertension, refractory ascites, presenting for tips EXAM: ULTRASOUND-GUIDED PARACENTESIS ABORTED TIPS ULTRASOUND-GUIDED ACCESS RIGHT INTERNAL JUGULAR VEIN MEDICATIONS: Clindamycin antibiotics ANESTHESIA/SEDATION: General - as administered by the Anesthesia department The patient was continuously monitored during the procedure by the interventional radiology nurse under my direct supervision. CONTRAST:  45m OMNIPAQUE IOHEXOL 300 MG/ML SOLN, 49mOMNIPAQUE IOHEXOL 300 MG/ML SOLN FLUOROSCOPY TIME:  Fluoroscopy Time: 31 minutes 6 seconds (602 mGy). COMPLICATIONS: None PROCEDURE: Informed written consent was obtained from the patient's family after a thorough discussion of the procedural risks, benefits and alternatives. Specific risks discussed with TIPS/variceal embolization included: Bleeding, infection, vascular injury, need for further procedure/surgery, renal injury/renal failure, contrast reaction, non-target embolization, liver dysfunction/failure, hepatic encephalopathy, stroke (~1%), cardiopulmonary collapse, death. All questions were addressed. Maximal Sterile Barrier Technique was utilized including caps, mask, sterile gowns, sterile gloves, sterile drape, hand hygiene and skin antiseptic. A timeout was performed prior to the initiation of the procedure. Patient was positioned supine position on the table, with the right upper quadrant and  the right neck prepped and draped in the usual sterile fashion. Ultrasound survey of the right upper quadrant was performed, with images stored and sent to PACs. Ultrasound images of the abdomen performed 2 identifies safe site for paracentesis. A small stab incision was made, and then a Safe-T-Centesis kit was advanced into the abdomen. We initiated large volume paracentesis with vacuo-tainer. Using ultrasound guidance, Chiba needle was used access the right portal system. Once we confirmed position in the portal system with portal venography, micro wire was advanced into the inferior mesenteric vein. The inner dilator was advanced over the metal stiffener into the portal system, with the inner dilator advanced over the wire. Venogram confirmed are location within the portal system. The micro wire was then replaced into the inner dilator, with the transition point position at the apex of the catheter. Check flow vial was placed on the back and of the inner dilator. Ultrasound survey was then performed of the right neck, with images stored and sent to PACs. Ultrasound guidance was then used to access the right internal jugular vein with a micro puncture kit. The wire was  advanced under fluoroscopy into the right atrium, and a small incision was made. The needle was removed and the dilator was placed. The micro wire in the stiffener were removed and an 035 wire was then passed into the inferior vena cava. Ten French soft tissue dilation was performed on the wire, and then 10 [place] TIPS sheath was placed. Combination of Benson wire and multipurpose angled catheter were used to select several right hepatic vein. Initially we elected the largest and most caudal oriented hepatic vein for attempted trans a Paddock tract. Sheath was then advanced over the catheter with a coaxial wire, for a position cephalad to the transition point of the wire in the portal venous system. Simultaneous venogram of the portal system and the  hepatic venous system was performed both in a frontal projection and a right anterior oblique projection. The Colapinto needle was then advanced through the TIPS sheath housed in the Teflon sheath over the Amplatz wire. Using frontal and oblique projections, approximately 3 passes were attempted to access the portal venous system. Given the orientation of the portal system and the hepatic vein, we then withdrew the colapinto needle in attempt to find a more cephalad hepatic vein for access. Two additional hepatic veins were selected. Neither of these was large enough or with an orientation that would allow passage of the tips sheath. Thus, we decided to return to the first and largest of the hepatic veins for additional attempts for trans a [place] tract to the right portal system. Multiple attempts at passing the colapinto needle were unsuccessful in entering the portal system. Ultimately we decided to withdraw at 32 minutes of fluoro time, having not identified an adequate angle from hepatic vein to portal vein tract. All catheters and wires were removed with manual pressure for hemostasis. Patient tolerated the procedure well and remained hemodynamically stable throughout. No complications were encountered and no significant blood loss. IMPRESSION: Status post ultrasound-guided paracentesis, ultrasound-guided right IJ access, ultrasound-guided percutaneous transhepatic portal access, for TIPS attempt, which was ultimately unsuccessful. Signed, Dulcy Fanny. Dellia Nims, RPVI [facility] Radiology PLAN: The patient will be observed overnight after recovery in the PACU. Patient will follow-up in [place] [facility], and we will discuss noncontrast MR or CT pending another attempt for TIPS Continue large volume paracentesis as needed Electronically Signed   By: Corrie Mckusick D.O.   On: 11/25/2019 11:17    Labs:  CBC: Recent Labs    08/18/19 0519 08/18/19 0519 08/20/19 0251  09/19/19 1307 11/25/19 0630 11/25/19 0852 11/25/19 0958 12/20/19 0624  WBC 3.5*  --  3.2*  --  7.7  --   --  6.2  HGB 9.1*   < > 9.1*   < > 9.1* 7.5* 8.8* 8.9*  HCT 27.1*   < > 27.2*   < > 27.7* 22.0* 26.0* 27.4*  PLT 87*  --  83*  --  153  --   --  115*   < > = values in this interval not displayed.    COAGS: Recent Labs    08/16/19 0014 10/16/19 1720 11/25/19 0630 12/20/19 0624  INR 1.3* 1.3* 1.5* 1.4*  APTT  --   --  35  --     BMP: Recent Labs    08/20/19 0251 08/29/19 1529 10/23/19 1616 11/08/19 1454 11/19/19 1640 11/19/19 1640 11/25/19 0630 11/25/19 0630 11/25/19 0852 11/25/19 0958 12/11/19 1608 12/20/19 0624  NA 133*   < > 130*   < > 123*   < >  130*   < > 132* 131* 128* 127*  K 4.9   < > 4.6   < > 5.2*   < > 4.8   < > 4.8 5.3* 5.3 No hemolysis seen* 4.9  CL 104   < > 102   < > 98  --  104  --   --   --  104 104  CO2 22   < > 19*   < > 20  --  17*  --   --   --  16* 12*  GLUCOSE 102*   < > 157*   < > 142*  --  149*  --   --   --  128* 145*  BUN 21*   < > 29*   < > 29*  --  28*  --   --   --  33* 41*  CALCIUM 8.2*   < > 9.4   < > 8.4  --  8.3*  --   --   --  8.8 8.6*  CREATININE 0.94   < > 1.37*   < > 1.38*  --  1.96*  --   --   --  1.49* 1.79*  GFRNONAA >60  --  43*  --   --   --  28*  --   --   --   --  31*  GFRAA >60  --  50*  --   --   --  32*  --   --   --   --  36*   < > = values in this interval not displayed.    LIVER FUNCTION TESTS: Recent Labs    09/06/19 1428 09/16/19 1638 11/25/19 0630 12/20/19 0624  BILITOT 1.4* 1.2 1.9* 2.9*  AST 41* 50* 30 35  ALT _0 ALKPHOS 93 83 106 124  PROT 6.9 6.3 5.2* 5.9*  ALBUMIN 3.3* 3.3* 2.4* 2.7*    TUMOR MARKERS: Recent Labs    07/31/19 1154  AFPTM 2.3    Assessment and Plan:  Meagan Peck is a 59 yo female with worsening hepato-renal syndrome in the setting of cirrhosis/portal HTN and refractory ascites.   I feel that with a MELD of 27, TIPS should be withheld for now.   Today we  had a discussion regarding the logistics, procedure, and theory of a denver shunt.  I feel this is currently the only other intervention to help treat the ascites, other than continued large volume paracentesis and/or externalized peritoneal shunt.    She is very clear that she has plenty of vigor remaining, and she want to do everything possible for prolonging life.    I did let her know that a DS can be useful for restoring some of the renal function, which may allow an interval TIPs in the future.   We discussed placement which would be at least 1 night stay in the hospital after placement of observe for DIC.    Regarding risks, specific risks include: bleeding, infection including systemic infection, injury to adjacent structures, embolism, malfunction requiring further procedure/surgery, disseminated intravascular coagulopathy (DIC), thrombocytopenia, hospitalization, cardiopulmonary collapse, death.   After our discussion, she would like to proceed.   Plan: - Plan for Denver Shunt with Dr. Earleen Newport, [facility] vs [facility], soonest possible, with moderate sedation and 1 night observation planned - Would defer any paracentesis 5 days - 7 days before the DS      Electronically Signed: Corrie Mckusick 12/24/2019, 3:57 PM  I spent a total of    15 Minutes in remote  clinical consultation, greater than 50% of which was counseling/coordinating care for refractory ascites, possible denver shunt placement.    Visit type: Audio only (telephone). Audio (no video) only due to patient's lack of internet/smartphone capability. Alternative for in-person consultation at [facility], [address] [place], [place], [place]. This visit type was conducted due to national recommendations for restrictions regarding the COVID-19 Pandemic (e.g. social distancing).  This format is felt to be most appropriate for this patient at this time.  All issues noted in this document were discussed and addressed.

## 2019-12-25 ENCOUNTER — Telehealth: Payer: Self-pay | Admitting: Gastroenterology

## 2019-12-25 LAB — CULTURE, BODY FLUID W GRAM STAIN -BOTTLE: Culture: NO GROWTH

## 2019-12-25 NOTE — Telephone Encounter (Signed)
Okay. Not sure if she can appeal or not? She is pending a denver shunt by IR but if this does not help I really think she needs to see Hepatology if she wishes to be considered for transplant.

## 2019-12-25 NOTE — Telephone Encounter (Signed)
Pt states the referral to the Liver clinic is not covered by her insurance.

## 2019-12-25 NOTE — Telephone Encounter (Signed)
Called and LM for pt to call her insurance company or discuss with PCP where they would cover her to be seen by a Hepatologist

## 2019-12-25 NOTE — Discharge Summary (Addendum)
Patient ID: Meagan Peck MRN: [id number] DOB/AGE: [birth year] 59 y.o.  Admit date: 12/20/2019 Discharge date: 12/20/19  Supervising Physician: Corrie Mckusick  Patient Status: [facility] - In-pt  Admission Diagnoses: NASH Cirrhosis                                         Refractory ascites  Discharge Diagnoses:  NASH Cirrhosis Refractory ascites  Discharged Condition: stable  Hospital Course: pt was seen in IR 12/20/19 for Transjugular Intrahepatic Portal System Shunt procedure.  Dr Earleen Newport note 8/20:  Her MELD has unfortunately continued to increase, last was 25, now calculated 27.  While there is data to support performing TIPS in borderline high MELD (24-25) in the setting of renal dysfunction, she has now increasing Tbili, which portends poor outcome if performed.  I had lengthy discussion with the patient today.  I propose that we perform paracentesis today to evaluate the ascites for any sign of infection, and work towards performing interval Denver Shunt.  The DS can also help recover some renal function, would hopefully improve the MELD, and then we can perform the TIPS in the future.  Procedure was not performed and pt was discharged home without admission at all.  Consults: None  Significant Diagnostic Studies: Paracentesis                                                        5 Liters removed  Discharge Exam: Blood pressure 130/62, pulse 93, temperature 97.7 F (36.5 C), temperature source Temporal, resp. rate 18, height 4' 10"  (1.473 m), weight 150 lb (68 kg), last menstrual period 12/03/2013, SpO2 100 %.  Physical Exam Vital Signs: BP 130/62   Pulse 93   Temp 97.7 F (36.5 C) (Temporal)   Resp 18   Ht 4' 10"  (1.473 m)   Wt 150 lb (68 kg)   LMP 12/03/2013 (LMP Unknown)   SpO2 100%   BMI 31.35 kg/m   HENT:     Mouth/Throat:     Mouth: Mucous membranes are moist.  Eyes:     Extraocular Movements: Extraocular movements intact.  Cardiovascular:     Rate and  Rhythm: Normal rate.     Heart sounds: Normal heart sounds.  Pulmonary:     Breath sounds: Normal breath sounds.  Abdominal:     General: There is distension.     Tenderness: There is abdominal tenderness.  Musculoskeletal:        General: Swelling present. Normal range of motion.     Right lower leg: Edema present.     Left lower leg: Edema present.  Skin:    General: Skin is warm.     Coloration: Skin is not jaundiced.  Neurological:     Mental Status: She is alert and oriented to person, place, and time.  Psychiatric:        Behavior: Behavior normal.   Disposition:  Pt was discharged 8/20 after decision made to NOT perform procedure. Scheduled for consultation in [facility] for consideration of Denver Shunt placement with Dr Earleen Newport  Discharge Instructions    Discharge patient   Complete by: As directed    Discharge disposition: 01-Home or Self Care   Discharge patient  date: 12/20/2019     Allergies as of 12/25/2019      Reactions   Penicillins Anaphylaxis   Happened as a young child   Atrovent [ipratropium] Palpitations   As per patient   Naproxen Rash   Quinine Derivatives Rash   Sulfa Antibiotics Rash      Medication List    TAKE these medications   Accu-Chek FastClix Lancets Misc Check fsbs TID   aspirin 81 MG tablet Take 1 tablet (81 mg total) by mouth daily. What changed: when to take this   cetirizine 10 MG tablet Commonly known as: ZYRTEC Take 1 tablet (10 mg total) by mouth daily. What changed:   when to take this  reasons to take this   ferrous sulfate 325 (65 FE) MG EC tablet Take 1 tablet (325 mg total) by mouth 2 (two) times daily with a meal.   Krill Oil 500 MG Caps Take 500 mg by mouth daily.   magnesium oxide 400 MG tablet Commonly known as: MAG-OX Take 1 tablet (400 mg total) by mouth 2 (two) times daily.   mupirocin ointment 2 % Commonly known as: Bactroban Apply 1 application topically 3 (three) times daily. What changed:  when to take this   Vitamin D3 125 MCG (5000 UT) Tabs Take 5,000 Units by mouth daily.         Electronically Signed: Lavonia Drafts, PA-C 12/25/2019, 9:17 AM   I have spent Less Than 30 Minutes discharging [place].

## 2019-12-26 ENCOUNTER — Other Ambulatory Visit: Payer: Self-pay

## 2019-12-26 ENCOUNTER — Ambulatory Visit (HOSPITAL_COMMUNITY)
Admission: RE | Admit: 2019-12-26 | Discharge: 2019-12-26 | Disposition: A | Discharge status: Home / Self Care | Payer: No Typology Code available for payment source | Source: Ambulatory Visit | Attending: Gastroenterology | Admitting: Gastroenterology

## 2019-12-26 DIAGNOSIS — K7460 Unspecified cirrhosis of liver: Secondary | ICD-10-CM | POA: Diagnosis not present

## 2019-12-26 DIAGNOSIS — R188 Other ascites: Secondary | ICD-10-CM | POA: Diagnosis not present

## 2019-12-26 HISTORY — PX: IR PARACENTESIS: IMG2679

## 2019-12-26 LAB — BODY FLUID CELL COUNT WITH DIFFERENTIAL
Eos, Fluid: 1 %
Lymphs, Fluid: 50 %
Monocyte-Macrophage-Serous Fluid: 45 % — ABNORMAL LOW (ref 50–90)
Neutrophil Count, Fluid: 4 % (ref 0–25)
Total Nucleated Cell Count, Fluid: 28 cu mm (ref 0–1000)

## 2019-12-26 MED ORDER — ALBUMIN HUMAN 25 % IV SOLN
50.0000 g | Freq: Once | INTRAVENOUS | Status: AC
  Administered 2019-12-26: 50 g via INTRAVENOUS

## 2019-12-26 MED ORDER — ALBUMIN HUMAN 25 % IV SOLN
25.0000 g | Freq: Once | INTRAVENOUS | Status: AC
  Administered 2019-12-26: 25 g via INTRAVENOUS
  Filled 2019-12-26: qty 100

## 2019-12-26 MED ORDER — ALBUMIN HUMAN 25 % IV SOLN
INTRAVENOUS | Status: AC
  Filled 2019-12-26: qty 100

## 2019-12-26 MED ORDER — LIDOCAINE HCL 1 % IJ SOLN
INTRAMUSCULAR | Status: AC
  Filled 2019-12-26: qty 20

## 2019-12-26 MED ORDER — ALBUMIN HUMAN 25 % IV SOLN
INTRAVENOUS | Status: AC
  Filled 2019-12-26: qty 200

## 2019-12-26 MED ORDER — LIDOCAINE HCL (PF) 1 % IJ SOLN
INTRAMUSCULAR | Status: DC | PRN
  Administered 2019-12-26: 10 mL

## 2019-12-26 NOTE — Procedures (Signed)
PROCEDURE SUMMARY:  Successful US guided paracentesis from left lateral abdomen.  Yielded 10.4 liters of clear yellow fluid.  No immediate complications.  Patient tolerated well.  EBL = trace  Specimen was sent for labs.  Judie Grieve Alaze Garverick PA-C 12/26/2019 12:27 PM

## 2019-12-27 LAB — PATHOLOGIST SMEAR REVIEW

## 2019-12-30 ENCOUNTER — Other Ambulatory Visit (HOSPITAL_COMMUNITY): Payer: Self-pay | Admitting: Interventional Radiology

## 2019-12-30 ENCOUNTER — Telehealth (HOSPITAL_COMMUNITY): Payer: Self-pay

## 2019-12-30 DIAGNOSIS — R188 Other ascites: Secondary | ICD-10-CM

## 2019-12-30 DIAGNOSIS — K7460 Unspecified cirrhosis of liver: Secondary | ICD-10-CM

## 2019-12-30 NOTE — Telephone Encounter (Signed)
Sent pt a MyChart message inquiring about insurance coverage for Hepatology services. See Mychart message

## 2019-12-30 NOTE — Telephone Encounter (Signed)
Called to schedule Denver Shunt procedure, no answer, left vm. AW

## 2019-12-31 ENCOUNTER — Ambulatory Visit (HOSPITAL_COMMUNITY)
Admission: RE | Admit: 2019-12-31 | Discharge: 2019-12-31 | Disposition: A | Discharge status: Home / Self Care | Payer: No Typology Code available for payment source | Source: Ambulatory Visit | Attending: Gastroenterology | Admitting: Gastroenterology

## 2019-12-31 ENCOUNTER — Ambulatory Visit (HOSPITAL_COMMUNITY): Payer: No Typology Code available for payment source

## 2019-12-31 ENCOUNTER — Other Ambulatory Visit (INDEPENDENT_AMBULATORY_CARE_PROVIDER_SITE_OTHER): Payer: No Typology Code available for payment source

## 2019-12-31 DIAGNOSIS — R188 Other ascites: Secondary | ICD-10-CM | POA: Diagnosis not present

## 2019-12-31 DIAGNOSIS — K7460 Unspecified cirrhosis of liver: Secondary | ICD-10-CM | POA: Diagnosis present

## 2019-12-31 HISTORY — PX: IR PARACENTESIS: IMG2679

## 2019-12-31 LAB — BASIC METABOLIC PANEL
BUN: 36 mg/dL — ABNORMAL HIGH (ref 6–23)
CO2: 14 mEq/L — ABNORMAL LOW (ref 19–32)
Calcium: 9.1 mg/dL (ref 8.4–10.5)
Chloride: 103 mEq/L (ref 96–112)
Creatinine, Ser: 1.53 mg/dL — ABNORMAL HIGH (ref 0.40–1.20)
GFR: 34.98 mL/min — ABNORMAL LOW (ref >60.00)
Glucose, Bld: 125 mg/dL — ABNORMAL HIGH (ref 70–99)
Potassium: 5.2 mEq/L — ABNORMAL HIGH (ref 3.5–5.1)
Sodium: 128 mEq/L — ABNORMAL LOW (ref 135–145)

## 2019-12-31 LAB — BODY FLUID CELL COUNT WITH DIFFERENTIAL
Eos, Fluid: 1 %
Lymphs, Fluid: 87 %
Monocyte-Macrophage-Serous Fluid: 9 % — ABNORMAL LOW (ref 50–90)
Neutrophil Count, Fluid: 3 % (ref 0–25)
Total Nucleated Cell Count, Fluid: 16 cu mm (ref 0–1000)

## 2019-12-31 MED ORDER — LIDOCAINE HCL 1 % IJ SOLN
INTRAMUSCULAR | Status: DC | PRN
  Administered 2019-12-31: 10 mL

## 2019-12-31 MED ORDER — ALBUMIN HUMAN 25 % IV SOLN
INTRAVENOUS | Status: AC
  Filled 2019-12-31: qty 200

## 2019-12-31 MED ORDER — ALBUMIN HUMAN 25 % IV SOLN
50.0000 g | Freq: Once | INTRAVENOUS | Status: AC
  Administered 2019-12-31: 50 g via INTRAVENOUS
  Filled 2019-12-31: qty 200

## 2019-12-31 MED ORDER — LIDOCAINE HCL 1 % IJ SOLN
INTRAMUSCULAR | Status: AC
  Filled 2019-12-31: qty 20

## 2019-12-31 NOTE — Addendum Note (Signed)
Addended by: Jacob Moores on: 12/31/2019 12:29 PM   Modules accepted: Orders

## 2019-12-31 NOTE — Procedures (Signed)
PROCEDURE SUMMARY:  Successful US guided paracentesis from right abdomen.  Yielded 8.2 L of clear yellow fluid.  No immediate complications.  Pt tolerated well.   Specimen sent for labs.  EBL < 35mL  Theresa Duty, NP 12/31/2019 12:33 PM

## 2020-01-01 ENCOUNTER — Telehealth (HOSPITAL_COMMUNITY): Payer: Self-pay

## 2020-01-01 NOTE — Telephone Encounter (Signed)
Called to reschedule procedure, no answer, left vm. AW

## 2020-01-02 LAB — PATHOLOGIST SMEAR REVIEW

## 2020-01-07 ENCOUNTER — Ambulatory Visit (HOSPITAL_COMMUNITY)
Admission: RE | Admit: 2020-01-07 | Discharge: 2020-01-07 | Disposition: A | Discharge status: Home / Self Care | Payer: No Typology Code available for payment source | Source: Ambulatory Visit | Attending: Gastroenterology | Admitting: Gastroenterology

## 2020-01-07 ENCOUNTER — Inpatient Hospital Stay (HOSPITAL_COMMUNITY)
Admission: EM | Admit: 2020-01-07 | Discharge: 2020-01-22 | DRG: 981 | Disposition: A | Discharge status: Home / Self Care | Payer: No Typology Code available for payment source | Attending: Internal Medicine | Admitting: Internal Medicine

## 2020-01-07 ENCOUNTER — Other Ambulatory Visit: Payer: Self-pay

## 2020-01-07 ENCOUNTER — Ambulatory Visit (HOSPITAL_COMMUNITY): Payer: No Typology Code available for payment source

## 2020-01-07 ENCOUNTER — Ambulatory Visit (HOSPITAL_COMMUNITY): Admission: RE | Admit: 2020-01-07 | Payer: No Typology Code available for payment source | Source: Ambulatory Visit

## 2020-01-07 DIAGNOSIS — Z886 Allergy status to analgesic agent status: Secondary | ICD-10-CM

## 2020-01-07 DIAGNOSIS — R6520 Severe sepsis without septic shock: Secondary | ICD-10-CM

## 2020-01-07 DIAGNOSIS — E1121 Type 2 diabetes mellitus with diabetic nephropathy: Secondary | ICD-10-CM | POA: Diagnosis present

## 2020-01-07 DIAGNOSIS — Z9049 Acquired absence of other specified parts of digestive tract: Secondary | ICD-10-CM

## 2020-01-07 DIAGNOSIS — R9389 Abnormal findings on diagnostic imaging of other specified body structures: Secondary | ICD-10-CM

## 2020-01-07 DIAGNOSIS — K219 Gastro-esophageal reflux disease without esophagitis: Secondary | ICD-10-CM | POA: Diagnosis present

## 2020-01-07 DIAGNOSIS — E1169 Type 2 diabetes mellitus with other specified complication: Secondary | ICD-10-CM | POA: Diagnosis present

## 2020-01-07 DIAGNOSIS — R188 Other ascites: Secondary | ICD-10-CM

## 2020-01-07 DIAGNOSIS — K652 Spontaneous bacterial peritonitis: Secondary | ICD-10-CM | POA: Diagnosis not present

## 2020-01-07 DIAGNOSIS — I8510 Secondary esophageal varices without bleeding: Secondary | ICD-10-CM | POA: Diagnosis present

## 2020-01-07 DIAGNOSIS — K7460 Unspecified cirrhosis of liver: Secondary | ICD-10-CM | POA: Diagnosis present

## 2020-01-07 DIAGNOSIS — E669 Obesity, unspecified: Secondary | ICD-10-CM | POA: Diagnosis present

## 2020-01-07 DIAGNOSIS — Z79899 Other long term (current) drug therapy: Secondary | ICD-10-CM

## 2020-01-07 DIAGNOSIS — Z7982 Long term (current) use of aspirin: Secondary | ICD-10-CM

## 2020-01-07 DIAGNOSIS — G4700 Insomnia, unspecified: Secondary | ICD-10-CM | POA: Diagnosis present

## 2020-01-07 DIAGNOSIS — Z8249 Family history of ischemic heart disease and other diseases of the circulatory system: Secondary | ICD-10-CM

## 2020-01-07 DIAGNOSIS — A419 Sepsis, unspecified organism: Secondary | ICD-10-CM

## 2020-01-07 DIAGNOSIS — D696 Thrombocytopenia, unspecified: Secondary | ICD-10-CM | POA: Diagnosis present

## 2020-01-07 DIAGNOSIS — Z888 Allergy status to other drugs, medicaments and biological substances status: Secondary | ICD-10-CM

## 2020-01-07 DIAGNOSIS — K429 Umbilical hernia without obstruction or gangrene: Secondary | ICD-10-CM | POA: Diagnosis present

## 2020-01-07 DIAGNOSIS — N1831 Chronic kidney disease, stage 3a: Secondary | ICD-10-CM | POA: Diagnosis present

## 2020-01-07 DIAGNOSIS — K767 Hepatorenal syndrome: Secondary | ICD-10-CM | POA: Diagnosis present

## 2020-01-07 DIAGNOSIS — E785 Hyperlipidemia, unspecified: Secondary | ICD-10-CM | POA: Diagnosis present

## 2020-01-07 DIAGNOSIS — K5900 Constipation, unspecified: Secondary | ICD-10-CM | POA: Diagnosis present

## 2020-01-07 DIAGNOSIS — R791 Abnormal coagulation profile: Secondary | ICD-10-CM | POA: Diagnosis present

## 2020-01-07 DIAGNOSIS — D509 Iron deficiency anemia, unspecified: Secondary | ICD-10-CM | POA: Diagnosis present

## 2020-01-07 DIAGNOSIS — I129 Hypertensive chronic kidney disease with stage 1 through stage 4 chronic kidney disease, or unspecified chronic kidney disease: Secondary | ICD-10-CM | POA: Diagnosis present

## 2020-01-07 DIAGNOSIS — B9620 Unspecified Escherichia coli [E. coli] as the cause of diseases classified elsewhere: Secondary | ICD-10-CM | POA: Diagnosis present

## 2020-01-07 DIAGNOSIS — J302 Other seasonal allergic rhinitis: Secondary | ICD-10-CM | POA: Diagnosis present

## 2020-01-07 DIAGNOSIS — Z882 Allergy status to sulfonamides status: Secondary | ICD-10-CM

## 2020-01-07 DIAGNOSIS — I959 Hypotension, unspecified: Secondary | ICD-10-CM | POA: Diagnosis present

## 2020-01-07 DIAGNOSIS — Z20822 Contact with and (suspected) exposure to covid-19: Secondary | ICD-10-CM | POA: Diagnosis present

## 2020-01-07 DIAGNOSIS — J90 Pleural effusion, not elsewhere classified: Secondary | ICD-10-CM | POA: Diagnosis not present

## 2020-01-07 DIAGNOSIS — Z23 Encounter for immunization: Secondary | ICD-10-CM

## 2020-01-07 DIAGNOSIS — D6959 Other secondary thrombocytopenia: Secondary | ICD-10-CM | POA: Diagnosis present

## 2020-01-07 DIAGNOSIS — K7581 Nonalcoholic steatohepatitis (NASH): Secondary | ICD-10-CM | POA: Diagnosis present

## 2020-01-07 DIAGNOSIS — E1122 Type 2 diabetes mellitus with diabetic chronic kidney disease: Secondary | ICD-10-CM | POA: Diagnosis present

## 2020-01-07 DIAGNOSIS — K7469 Other cirrhosis of liver: Secondary | ICD-10-CM | POA: Diagnosis present

## 2020-01-07 DIAGNOSIS — Z6829 Body mass index (BMI) 29.0-29.9, adult: Secondary | ICD-10-CM

## 2020-01-07 DIAGNOSIS — N179 Acute kidney failure, unspecified: Secondary | ICD-10-CM | POA: Diagnosis present

## 2020-01-07 DIAGNOSIS — K766 Portal hypertension: Secondary | ICD-10-CM | POA: Diagnosis present

## 2020-01-07 DIAGNOSIS — Z833 Family history of diabetes mellitus: Secondary | ICD-10-CM

## 2020-01-07 DIAGNOSIS — K7031 Alcoholic cirrhosis of liver with ascites: Secondary | ICD-10-CM

## 2020-01-07 DIAGNOSIS — K2289 Angiodysplasia of stomach and duodenum without bleeding: Secondary | ICD-10-CM

## 2020-01-07 DIAGNOSIS — E871 Hypo-osmolality and hyponatremia: Secondary | ICD-10-CM | POA: Diagnosis present

## 2020-01-07 DIAGNOSIS — R1310 Dysphagia, unspecified: Secondary | ICD-10-CM

## 2020-01-07 DIAGNOSIS — K31819 Angiodysplasia of stomach and duodenum without bleeding: Secondary | ICD-10-CM | POA: Diagnosis present

## 2020-01-07 DIAGNOSIS — E1165 Type 2 diabetes mellitus with hyperglycemia: Secondary | ICD-10-CM | POA: Diagnosis not present

## 2020-01-07 DIAGNOSIS — Z880 Allergy status to penicillin: Secondary | ICD-10-CM

## 2020-01-07 DIAGNOSIS — E872 Acidosis: Secondary | ICD-10-CM | POA: Diagnosis present

## 2020-01-07 DIAGNOSIS — E875 Hyperkalemia: Secondary | ICD-10-CM | POA: Diagnosis not present

## 2020-01-07 HISTORY — PX: IR PARACENTESIS: IMG2679

## 2020-01-07 LAB — CBC WITH DIFFERENTIAL/PLATELET
Abs Immature Granulocytes: 0.03 10*3/uL (ref 0.00–0.07)
Basophils Absolute: 0 10*3/uL (ref 0.0–0.1)
Basophils Relative: 0 %
Eosinophils Absolute: 0 10*3/uL (ref 0.0–0.5)
Eosinophils Relative: 0 %
HCT: 23.7 % — ABNORMAL LOW (ref 36.0–46.0)
Hemoglobin: 7.8 g/dL — ABNORMAL LOW (ref 12.0–15.0)
Immature Granulocytes: 0 %
Lymphocytes Relative: 8 %
Lymphs Abs: 0.7 10*3/uL (ref 0.7–4.0)
MCH: 30.5 pg (ref 26.0–34.0)
MCHC: 32.9 g/dL (ref 30.0–36.0)
MCV: 92.6 fL (ref 80.0–100.0)
Monocytes Absolute: 0.8 10*3/uL (ref 0.1–1.0)
Monocytes Relative: 9 %
Neutro Abs: 7.6 10*3/uL (ref 1.7–7.7)
Neutrophils Relative %: 83 %
Platelets: 75 10*3/uL — ABNORMAL LOW (ref 150–400)
RBC: 2.56 MIL/uL — ABNORMAL LOW (ref 3.87–5.11)
RDW: 15.6 % — ABNORMAL HIGH (ref 11.5–15.5)
WBC: 9.3 10*3/uL (ref 4.0–10.5)
nRBC: 0 % (ref 0.0–0.2)

## 2020-01-07 LAB — URINALYSIS, ROUTINE W REFLEX MICROSCOPIC
Bilirubin Urine: NEGATIVE
Glucose, UA: NEGATIVE mg/dL
Ketones, ur: NEGATIVE mg/dL
Nitrite: NEGATIVE
Protein, ur: NEGATIVE mg/dL
Specific Gravity, Urine: 1.019 (ref 1.005–1.030)
WBC, UA: >50 WBC/hpf — ABNORMAL HIGH (ref 0–5)
pH: 5 (ref 5.0–8.0)

## 2020-01-07 LAB — COMPREHENSIVE METABOLIC PANEL
ALT: 21 U/L (ref 0–44)
AST: 33 U/L (ref 15–41)
Albumin: 3.3 g/dL — ABNORMAL LOW (ref 3.5–5.0)
Alkaline Phosphatase: 81 U/L (ref 38–126)
Anion gap: 9 (ref 5–15)
BUN: 38 mg/dL — ABNORMAL HIGH (ref 6–20)
CO2: 15 mmol/L — ABNORMAL LOW (ref 22–32)
Calcium: 8.8 mg/dL — ABNORMAL LOW (ref 8.9–10.3)
Chloride: 104 mmol/L (ref 98–111)
Creatinine, Ser: 1.72 mg/dL — ABNORMAL HIGH (ref 0.44–1.00)
GFR calc Af Amer: 38 mL/min — ABNORMAL LOW (ref >60)
GFR calc non Af Amer: 32 mL/min — ABNORMAL LOW (ref >60)
Glucose, Bld: 219 mg/dL — ABNORMAL HIGH (ref 70–99)
Potassium: 5.1 mmol/L (ref 3.5–5.1)
Sodium: 128 mmol/L — ABNORMAL LOW (ref 135–145)
Total Bilirubin: 3.8 mg/dL — ABNORMAL HIGH (ref 0.3–1.2)
Total Protein: 5.5 g/dL — ABNORMAL LOW (ref 6.5–8.1)

## 2020-01-07 LAB — BODY FLUID CELL COUNT WITH DIFFERENTIAL
Lymphs, Fluid: 1 %
Monocyte-Macrophage-Serous Fluid: 23 % — ABNORMAL LOW (ref 50–90)
Neutrophil Count, Fluid: 75 % — ABNORMAL HIGH (ref 0–25)
Total Nucleated Cell Count, Fluid: 3306 cu mm — ABNORMAL HIGH (ref 0–1000)

## 2020-01-07 LAB — PROTIME-INR
INR: 1.8 — ABNORMAL HIGH (ref 0.8–1.2)
Prothrombin Time: 20.5 seconds — ABNORMAL HIGH (ref 11.4–15.2)

## 2020-01-07 LAB — LACTIC ACID, PLASMA: Lactic Acid, Venous: 2.3 mmol/L (ref 0.5–1.9)

## 2020-01-07 MED ORDER — ALBUMIN HUMAN 25 % IV SOLN
INTRAVENOUS | Status: AC
  Filled 2020-01-07: qty 200

## 2020-01-07 MED ORDER — LIDOCAINE HCL 1 % IJ SOLN
INTRAMUSCULAR | Status: AC | PRN
  Administered 2020-01-07: 10 mL

## 2020-01-07 MED ORDER — LIDOCAINE HCL 1 % IJ SOLN
INTRAMUSCULAR | Status: AC
  Filled 2020-01-07: qty 20

## 2020-01-07 MED ORDER — ALBUMIN HUMAN 25 % IV SOLN
50.0000 g | Freq: Once | INTRAVENOUS | Status: AC
  Administered 2020-01-07: 50 g via INTRAVENOUS
  Filled 2020-01-07: qty 200

## 2020-01-07 NOTE — Procedures (Signed)
PROCEDURE SUMMARY:  Successful US guided paracentesis from left lateral abdomen.  Yielded 7.0 liters of yellow fluid.  No immediate complications.  Pt tolerated well.   Specimen was sent for labs.  EBL < 43m  KDocia BarrierPA-C 01/07/2020 11:02 AM

## 2020-01-07 NOTE — ED Triage Notes (Signed)
Pt sent by doctor after having a paracentesis done today, fluid tested positive for infection, sent here for IV antibiotics. Pt c.o chronic abd pain.

## 2020-01-08 ENCOUNTER — Encounter (HOSPITAL_COMMUNITY): Payer: Self-pay | Admitting: Internal Medicine

## 2020-01-08 DIAGNOSIS — R188 Other ascites: Secondary | ICD-10-CM | POA: Diagnosis present

## 2020-01-08 DIAGNOSIS — E871 Hypo-osmolality and hyponatremia: Secondary | ICD-10-CM | POA: Diagnosis present

## 2020-01-08 DIAGNOSIS — K652 Spontaneous bacterial peritonitis: Secondary | ICD-10-CM | POA: Diagnosis present

## 2020-01-08 DIAGNOSIS — I8510 Secondary esophageal varices without bleeding: Secondary | ICD-10-CM | POA: Diagnosis present

## 2020-01-08 DIAGNOSIS — Z23 Encounter for immunization: Secondary | ICD-10-CM | POA: Diagnosis not present

## 2020-01-08 DIAGNOSIS — R601 Generalized edema: Secondary | ICD-10-CM | POA: Diagnosis not present

## 2020-01-08 DIAGNOSIS — I129 Hypertensive chronic kidney disease with stage 1 through stage 4 chronic kidney disease, or unspecified chronic kidney disease: Secondary | ICD-10-CM | POA: Diagnosis present

## 2020-01-08 DIAGNOSIS — K5900 Constipation, unspecified: Secondary | ICD-10-CM | POA: Diagnosis present

## 2020-01-08 DIAGNOSIS — E669 Obesity, unspecified: Secondary | ICD-10-CM | POA: Diagnosis present

## 2020-01-08 DIAGNOSIS — D649 Anemia, unspecified: Secondary | ICD-10-CM | POA: Diagnosis not present

## 2020-01-08 DIAGNOSIS — D696 Thrombocytopenia, unspecified: Secondary | ICD-10-CM | POA: Diagnosis not present

## 2020-01-08 DIAGNOSIS — K766 Portal hypertension: Secondary | ICD-10-CM | POA: Diagnosis present

## 2020-01-08 DIAGNOSIS — I340 Nonrheumatic mitral (valve) insufficiency: Secondary | ICD-10-CM | POA: Diagnosis not present

## 2020-01-08 DIAGNOSIS — R1084 Generalized abdominal pain: Secondary | ICD-10-CM | POA: Diagnosis not present

## 2020-01-08 DIAGNOSIS — N179 Acute kidney failure, unspecified: Secondary | ICD-10-CM | POA: Diagnosis present

## 2020-01-08 DIAGNOSIS — E872 Acidosis: Secondary | ICD-10-CM | POA: Diagnosis present

## 2020-01-08 DIAGNOSIS — K429 Umbilical hernia without obstruction or gangrene: Secondary | ICD-10-CM | POA: Diagnosis present

## 2020-01-08 DIAGNOSIS — N1831 Chronic kidney disease, stage 3a: Secondary | ICD-10-CM | POA: Diagnosis present

## 2020-01-08 DIAGNOSIS — E875 Hyperkalemia: Secondary | ICD-10-CM | POA: Diagnosis not present

## 2020-01-08 DIAGNOSIS — K31819 Angiodysplasia of stomach and duodenum without bleeding: Secondary | ICD-10-CM | POA: Diagnosis present

## 2020-01-08 DIAGNOSIS — K7460 Unspecified cirrhosis of liver: Secondary | ICD-10-CM | POA: Diagnosis present

## 2020-01-08 DIAGNOSIS — E1169 Type 2 diabetes mellitus with other specified complication: Secondary | ICD-10-CM | POA: Diagnosis present

## 2020-01-08 DIAGNOSIS — J90 Pleural effusion, not elsewhere classified: Secondary | ICD-10-CM | POA: Diagnosis not present

## 2020-01-08 DIAGNOSIS — I361 Nonrheumatic tricuspid (valve) insufficiency: Secondary | ICD-10-CM | POA: Diagnosis not present

## 2020-01-08 DIAGNOSIS — E785 Hyperlipidemia, unspecified: Secondary | ICD-10-CM | POA: Diagnosis present

## 2020-01-08 DIAGNOSIS — Z20822 Contact with and (suspected) exposure to covid-19: Secondary | ICD-10-CM | POA: Diagnosis present

## 2020-01-08 DIAGNOSIS — K7581 Nonalcoholic steatohepatitis (NASH): Secondary | ICD-10-CM | POA: Diagnosis present

## 2020-01-08 DIAGNOSIS — B9689 Other specified bacterial agents as the cause of diseases classified elsewhere: Secondary | ICD-10-CM | POA: Diagnosis not present

## 2020-01-08 DIAGNOSIS — Z95828 Presence of other vascular implants and grafts: Secondary | ICD-10-CM | POA: Diagnosis not present

## 2020-01-08 DIAGNOSIS — K767 Hepatorenal syndrome: Secondary | ICD-10-CM | POA: Diagnosis present

## 2020-01-08 DIAGNOSIS — K7469 Other cirrhosis of liver: Secondary | ICD-10-CM | POA: Diagnosis present

## 2020-01-08 DIAGNOSIS — K7011 Alcoholic hepatitis with ascites: Secondary | ICD-10-CM | POA: Diagnosis not present

## 2020-01-08 DIAGNOSIS — E1121 Type 2 diabetes mellitus with diabetic nephropathy: Secondary | ICD-10-CM | POA: Diagnosis present

## 2020-01-08 DIAGNOSIS — K219 Gastro-esophageal reflux disease without esophagitis: Secondary | ICD-10-CM | POA: Diagnosis present

## 2020-01-08 LAB — GRAM STAIN

## 2020-01-08 LAB — PATHOLOGIST SMEAR REVIEW

## 2020-01-08 LAB — SARS CORONAVIRUS 2 BY RT PCR (HOSPITAL ORDER, PERFORMED IN ~~LOC~~ HOSPITAL LAB): SARS Coronavirus 2: NEGATIVE

## 2020-01-08 LAB — LACTIC ACID, PLASMA
Lactic Acid, Venous: 1.4 mmol/L (ref 0.5–1.9)
Lactic Acid, Venous: 1.7 mmol/L (ref 0.5–1.9)

## 2020-01-08 MED ORDER — SODIUM CHLORIDE 0.9 % IV BOLUS
1000.0000 mL | Freq: Once | INTRAVENOUS | Status: AC
  Administered 2020-01-08: 1000 mL via INTRAVENOUS

## 2020-01-08 MED ORDER — ALBUMIN HUMAN 25 % IV SOLN
100.0000 g | Freq: Once | INTRAVENOUS | Status: AC
  Administered 2020-01-08: 100 g via INTRAVENOUS
  Filled 2020-01-08 (×3): qty 400

## 2020-01-08 MED ORDER — SODIUM CHLORIDE 0.9 % IV BOLUS
500.0000 mL | Freq: Once | INTRAVENOUS | Status: AC
  Administered 2020-01-08: 500 mL via INTRAVENOUS

## 2020-01-08 MED ORDER — SODIUM CHLORIDE 0.9 % IV SOLN
2.0000 g | INTRAVENOUS | Status: DC
  Administered 2020-01-08 – 2020-01-13 (×6): 2 g via INTRAVENOUS
  Filled 2020-01-08: qty 2
  Filled 2020-01-08 (×2): qty 20
  Filled 2020-01-08 (×3): qty 2
  Filled 2020-01-08: qty 20

## 2020-01-08 MED ORDER — IBUPROFEN 400 MG PO TABS
400.0000 mg | ORAL_TABLET | Freq: Four times a day (QID) | ORAL | Status: DC | PRN
  Administered 2020-01-08 – 2020-01-09 (×3): 400 mg via ORAL
  Filled 2020-01-08 (×3): qty 1

## 2020-01-08 NOTE — Progress Notes (Signed)
MD on call notified that LBM was 9/6 no orders for stool softener. Arthor Captain LPN

## 2020-01-08 NOTE — H&P (Signed)
Date: 01/08/2020               Patient Name:  Meagan Peck MRN: [id number]  DOB: 1961/04/27 Age / Sex: 59 y.o., female   PCP: [name], DO         Medical Service: Internal Medicine Teaching Service         Attending Physician: Dr. Lucious Groves, DO    First Contact: Dr. Konrad Penta Pager: [phone]  Second Contact: Dr. Myrtie Hawk Pager: [phone]       After Hours (After 5p/  First Contact Pager: [phone]  weekends / holidays): Second Contact Pager: [phone]   Chief Complaint: Abdominal pain,  History of Present Illness: This is a 59 year old female with a history of diabetes, hypertension, and recently diagnosed cirrhosis with weekly paracentesis presenting with abdominal pain and a recent paracentesis that showed findings concerning for SBP.  Patient was contacted by her GI doctor and advised to come into the ED for further work-up and management of the SBP.  Patient reports that since Monday she has been having some feelings of shakiness and cold, right-sided abdominal pain, mildly decreased appetite, and abdominal bloating.  Also endorses some headaches.  She denies any nausea, vomiting, fevers, chills, change in energy, shortness of breath, chest pain, or cough.  She reports that otherwise she has been feeling okay.  She had 7 L taken off her yesterday morning during the paracentesis.  She is currently in the process of trying to see a hepatologist in getting her insurance approved for that.  She was diagnosed with cirrhosis around March/April, states it is from fatty liver disease.  She had previously been on Lasix and spironolactone however this with discontinued after she developed multiple electrolyte abnormalities.  She also has been taken off her blood pressure and diabetes medications.  In the ED patient noted to be afebrile, respiratory rate 17, heart rate 82, with hypotensive/soft blood pressures at 93/49.  Urinalysis showed moderate hemoglobin, large leukocytes,> 50  WBCs, and rare bacteria.  CMP showed sodium 128, bicarb 15, creatinine 1.72, albumin 3.3, T bili 3.8, and normal AST, ALT, and alk phos.  Lactic acid mildly elevated at 2.3.  CBC showed WBC 9.3, hemoglobin 7.8, platelets 75.  PT 20 INR 1.8.  Obtained blood cultures.  Obtained Gram stain and cultures of paracentesis fluid.  Patient started on normal saline and given a dose of Rocephin.  Admitted to IMTS for further management.   Meds:  Current Meds  Medication Sig  . cetirizine (ZYRTEC) 10 MG tablet Take 1 tablet (10 mg total) by mouth daily. (Patient taking differently: Take 10 mg by mouth daily as needed for allergies. )     Allergies: Allergies as of 01/07/2020 - Review Complete 01/07/2020  Allergen Reaction Noted  . Penicillins Anaphylaxis 02/01/2013  . Atrovent [ipratropium] Palpitations 08/11/2017  . Naproxen Rash 02/01/2013  . Quinine derivatives Rash 04/14/2014  . Sulfa antibiotics Rash 02/01/2013   Past Medical History:  Diagnosis Date  . Acute medial meniscus tear of right knee 12/10/2015  . Allergy   . Arthritis    bil feet  . Ascites   . Cataract    bilateral - surgery to remove  . Cirrhosis ([place])   . Diabetes mellitus without complication ([place])    type 2 - last a1c was 5.2 per patient, no meds  . Dyspnea    when I have too much fluid  . Esophageal varices ([place])   . GERD (gastroesophageal reflux  disease)   . Heart murmur   . Hepatic cirrhosis ([place])   . HLD (hyperlipidemia)    no meds  . Hypertension    no meds  . Iron deficiency anemia 09/26/2018    Family History: Mother had COPD, father had cardiac issues.  Social History: Denies smoking, alcohol, or drug use.  Currently lives with her husband.  Works as a Careers information officer at [employer]: A complete ROS was negative except as per HPI.   Physical Exam: Blood pressure (!) 101/59, pulse 83, temperature 98.2 F (36.8 C), temperature source Oral, resp. rate (!) 21, last menstrual  period 12/03/2013, SpO2 100 %. Physical Exam Constitutional:      Appearance: She is well-developed.  HENT:     Mouth/Throat:     Mouth: Mucous membranes are moist.     Pharynx: Oropharynx is clear.  Eyes:     General: No scleral icterus.    Extraocular Movements: Extraocular movements intact.     Pupils: Pupils are equal, round, and reactive to light.  Cardiovascular:     Rate and Rhythm: Normal rate and regular rhythm.     Heart sounds: Normal heart sounds. No murmur heard.   Pulmonary:     Effort: Pulmonary effort is normal. No respiratory distress.     Breath sounds: Normal breath sounds.  Abdominal:     General: Bowel sounds are normal. There is distension. There are no signs of injury.     Palpations: Abdomen is soft. There is fluid wave.     Tenderness: There is generalized abdominal tenderness. There is no guarding or rebound.     Hernia: No hernia is present.  Genitourinary:    Rectum: Normal. Guaiac result negative.  Musculoskeletal:     Right lower leg: 1+ Pitting Edema present.     Left lower leg: 1+ Pitting Edema present.  Skin:    General: Skin is warm and dry.     Capillary Refill: Capillary refill takes less than 2 seconds.  Neurological:     General: No focal deficit present.     Mental Status: She is alert.  Psychiatric:        Mood and Affect: Mood normal.        Behavior: Behavior normal.    Assessment & Plan by Problem: Active Problems:   SBP (spontaneous bacterial peritonitis) (HCC)  SBP: This is a 59 year old female with a history of hypertension, diet-controlled diabetes, and recently diagnosed with cirrhosis secondary to diabetes versus cryptogenic that had been complicated by severe ascites and large esophageal varices requiring weekly paracentesis.  Who recently underwent paracentesis 01/07/2020 and labs were significant for elevated total nucleated cells that with 75% neutrophils consistent with SBP.  Symptoms since Monday include abdominal pain,  shaking, headaches, and decreased appetite.  No fever, chills, nausea, vomiting, chest pain, shortness of breath, or other symptoms.  On exam she has abdominal distention with a positive fluid wave mental status is intact and no evidence of jaundice on exam.  -Continue Rocephin -Follow-up Gram stain and culture of paracentesis fluid -Trend CBC -Monitor for signs of fluid reaccumulation and need for repeat paracentesis  Cirrhosis: Thought to be secondary to diabetes versus cryptogenic complicated by severe ascites and large esophageal varices.  Undergoing weekly paracentesis and trying to follow-up with hepatology for possible liver transplant.  She had previously been on Lasix and spironolactone however this was complicated by severe electrolyte abnormalities including hyperkalemia from Aldactone and hypokalemia and hyponatremia  from Lasix and this was discontinued.  She was evaluated for TIPS procedure by IR and was deemed not a candidate for this.  Currently pending a Denver shunt by IR and attempting to see hepatology for consideration of transplant.  -Continue to monitor for symptoms of decompensation and potential need for repeat paracentesis while in the hospital  Diabetes: Diabetes is well controlled with diet, recently was taken off her Metformin her last A1c was 5.2.  We will monitor her glucose levels while admitted, will start sliding scale if CBGs are elevated.  HTN: Patient not on any medications for blood pressure at home.  Blood pressure on the softer side at 101/59.  We will continue to monitor.  Asymptomatic bacteruria:  U/A showed large leukocytes, rare bacteria, and> 50 WBCs.  Denies any urinary symptoms at this time.  We will continue to monitor and hold off on antibiotic at this time.  Dispo: Admit patient to Inpatient with expected length of stay greater than 2 midnights.  Signed: Asencion Noble, MD 01/08/2020, 11:01 AM  Pager: [phone] After 5pm on weekdays and  1pm on weekends: On Call pager: [phone]

## 2020-01-08 NOTE — Progress Notes (Signed)
MD paged manual bp 90/50 asymptomatic. Will continue to monitor. Arthor Captain LPN

## 2020-01-08 NOTE — Progress Notes (Signed)
MD updated on post 500 ml bolus bp 85/50 manual. Arthor Captain LPN

## 2020-01-08 NOTE — Hospital Course (Addendum)
Patient was admitted 01/07/20 at the request of her GI physician (Dr. Enis Gash) for workup and management of abdominal pain, bloating and decreased appetite in the setting of SBP. Patient was first diagnosed with cirrhosis around March of this year and receives weekly paracentesis. Paracentesis fluid from 01/07/20 grew E. Coli. Thrombocytopenia, hyperalbuminemia, hyperbilirubinemia, and increase in PT/INR stable. No active signs of bleeding. Most recent paracentesis on 9/13 with negative Gram stain, negative path review, 68 total nucleated cells (improved). S/p TIPS procedure on 9/14.

## 2020-01-08 NOTE — Sepsis Progress Note (Signed)
Notified bedside nurse of need to draw repeat lactic acid. 

## 2020-01-08 NOTE — ED Provider Notes (Signed)
[facility] EMERGENCY DEPARTMENT Provider Note   CSN: [id number] Arrival date & time: 01/07/20  1836     History Chief Complaint  Patient presents with  . Abdominal Pain    Meagan Peck is a 59 y.o. female.  HPI 59 year old female presents at the request of her gastroenterologist.  Wilburn Mylar she had a paracentesis.  Labs were concerning for SBP.  She states 2 nights ago she had some pretty significant abdominal pain that is better now that she is had the paracentesis.  However she is starting to have a little bit of low back pain.  No fevers, vomiting, diarrhea.   Past Medical History:  Diagnosis Date  . Acute medial meniscus tear of right knee 12/10/2015  . Allergy   . Arthritis    bil feet  . Ascites   . Cataract    bilateral - surgery to remove  . Cirrhosis ([place])   . Diabetes mellitus without complication ([place])    type 2 - last a1c was 5.2 per patient, no meds  . Dyspnea    when I have too much fluid  . Esophageal varices ([place])   . GERD (gastroesophageal reflux disease)   . Heart murmur   . Hepatic cirrhosis ([place])   . HLD (hyperlipidemia)    no meds  . Hypertension    no meds  . Iron deficiency anemia 09/26/2018    Patient Active Problem List   Diagnosis Date Noted  . History of portal hypertension 11/25/2019  . Esophageal varices without bleeding ([place])   . Hypertension associated with diabetes ([place]) 08/16/2019  . Cirrhosis ([place]) 08/16/2019  . Vitamin D deficiency 05/15/2017  . Iron deficiency 05/15/2017  . Breast screening declined 05/15/2017  . Colonoscopy refused 05/15/2017  . Choroidal nevus of both eyes 07/18/2016  . Corneal epithelial and basement membrane dystrophy 07/18/2016  . Thrombocytopenia ([place]) 04/14/2016  . Hyperlipidemia associated with type 2 diabetes mellitus ([place]) 05/25/2015  . Gastroesophageal reflux disease 05/25/2015  . Type 2 diabetes mellitus with other specified complication ([place]) 97/67/3419    Past Surgical  History:  Procedure Laterality Date  . BIOPSY  08/20/2019   Procedure: BIOPSY;  Surgeon: Jackquline Denmark, MD;  Location: [facility];  Service: Gastroenterology;;  . CARPAL TUNNEL RELEASE Right   . CESAREAN SECTION    . CHOLECYSTECTOMY    . COLONOSCOPY  08/2019  . ESOPHAGEAL BANDING N/A 09/19/2019   Procedure: ESOPHAGEAL BANDING;  Surgeon: Yetta Flock, MD;  Location: [facility];  Service: Gastroenterology;  Laterality: N/A;  . ESOPHAGOGASTRODUODENOSCOPY (EGD) WITH PROPOFOL N/A 08/20/2019   Procedure: ESOPHAGOGASTRODUODENOSCOPY (EGD) WITH PROPOFOL;  Surgeon: Jackquline Denmark, MD;  Location: [facility];  Service: Gastroenterology;  Laterality: N/A;  . ESOPHAGOGASTRODUODENOSCOPY (EGD) WITH PROPOFOL N/A 09/19/2019   Procedure: ESOPHAGOGASTRODUODENOSCOPY (EGD) WITH PROPOFOL;  Surgeon: Yetta Flock, MD;  Location: [facility];  Service: Gastroenterology;  Laterality: N/A;  . EYE SURGERY Bilateral    removed cataracts  . GASTRIC VARICES BANDING N/A 08/20/2019   Procedure: GASTRIC VARICES BANDING;  Surgeon: Jackquline Denmark, MD;  Location: [facility];  Service: Gastroenterology;  Laterality: N/A;  . IR PARACENTESIS  08/01/2019  . IR PARACENTESIS  08/19/2019  . IR PARACENTESIS  09/09/2019  . IR PARACENTESIS  09/23/2019  . IR PARACENTESIS  10/08/2019  . IR PARACENTESIS  10/23/2019  . IR PARACENTESIS  11/12/2019  . IR PARACENTESIS  11/21/2019  . IR PARACENTESIS  11/25/2019  . IR PARACENTESIS  12/05/2019  . IR PARACENTESIS  12/13/2019  .  IR PARACENTESIS  12/20/2019  . IR PARACENTESIS  12/26/2019  . IR PARACENTESIS  12/31/2019  . IR PARACENTESIS  01/07/2020  . IR RADIOLOGIST EVAL & MGMT  10/30/2019  . IR RADIOLOGIST EVAL & MGMT  12/11/2019  . IR RADIOLOGIST EVAL & MGMT  12/24/2019  . IR TIPS  11/25/2019  . KNEE ARTHROSCOPY Right 12/10/2015   Procedure: RIGHT KNEE ARTHROSCOPY WITH PARTIAL MEDIAL MENISCECTOMY;  Surgeon: Mcarthur Rossetti, MD;  Location: [facility];  Service: Orthopedics;  Laterality:  Right;  . KNEE ARTHROSCOPY W/ MENISCAL REPAIR Bilateral   . MENISCUS REPAIR Left   . RADIOLOGY WITH ANESTHESIA N/A 11/25/2019   Procedure: TIPS;  Surgeon: Corrie Mckusick, DO;  Location: [facility];  Service: Anesthesiology;  Laterality: N/A;     OB History   No obstetric history on file.     Family History  Problem Relation Age of Onset  . Hypertension Mother   . COPD Mother   . Alcoholism Father   . Heart attack Father   . Diabetes Brother   . Diabetes Paternal Grandmother   . Heart disease Paternal Grandmother   . Alcoholism Paternal Uncle   . Alcoholism Maternal Uncle   . Heart disease Maternal Grandmother   . Heart disease Maternal Grandfather   . Heart disease Paternal Grandfather     Social History   Tobacco Use  . Smoking status: Never Smoker  . Smokeless tobacco: Never Used  Vaping Use  . Vaping Use: Never used  Substance Use Topics  . Alcohol use: No  . Drug use: No    Home Medications Prior to Admission medications   Medication Sig Start Date End Date Taking? Authorizing Provider  cetirizine (ZYRTEC) 10 MG tablet Take 1 tablet (10 mg total) by mouth daily. Patient taking differently: Take 10 mg by mouth daily as needed for allergies.  01/02/19  Yes [name], DO  ACCU-CHEK FASTCLIX LANCETS MISC Check fsbs TID 08/09/16   [provider]  aspirin 81 MG tablet Take 1 tablet (81 mg total) by mouth daily. Patient taking differently: Take 81 mg by mouth at bedtime.  08/30/19   Cherylann Ratel A, DO  Cholecalciferol (VITAMIN D3) 5000 UNITS TABS Take 5,000 Units by mouth daily.     [provider]  ferrous sulfate 325 (65 FE) MG EC tablet Take 1 tablet (325 mg total) by mouth 2 (two) times daily with a meal. 09/26/18   [name], DO  Krill Oil 500 MG CAPS Take 500 mg by mouth daily.    [provider]  magnesium oxide (MAG-OX) 400 MG tablet Take 1 tablet (400 mg total) by mouth 2 (two) times daily. 01/02/19   [name], DO    mupirocin ointment (BACTROBAN) 2 % Apply 1 application topically 3 (three) times daily. Patient not taking: Reported on 01/08/2020 10/22/19   Kandra Nicolas, MD    Allergies    Penicillins, Atrovent [ipratropium], Naproxen, Quinine derivatives, and Sulfa antibiotics  Review of Systems   Review of Systems  Constitutional: Negative for fever.  Gastrointestinal: Positive for abdominal pain. Negative for diarrhea and vomiting.  Musculoskeletal: Positive for back pain.  All other systems reviewed and are negative.   Physical Exam Updated Vital Signs BP (!) 93/49 (BP Location: Right Arm)   Pulse 82   Temp 98.2 F (36.8 C) (Oral)   Resp 17   LMP 12/03/2013 (LMP Unknown)   SpO2 100%   Physical Exam Vitals and nursing note reviewed.  Constitutional:  General: She is not in acute distress.    Appearance: She is well-developed. She is not ill-appearing or diaphoretic.  HENT:     Head: Normocephalic and atraumatic.     Right Ear: External ear normal.     Left Ear: External ear normal.     Nose: Nose normal.  Eyes:     General:        Right eye: No discharge.        Left eye: No discharge.  Cardiovascular:     Rate and Rhythm: Normal rate and regular rhythm.     Heart sounds: Normal heart sounds.  Pulmonary:     Effort: Pulmonary effort is normal.     Breath sounds: Normal breath sounds.  Abdominal:     General: There is distension.     Palpations: Abdomen is soft.     Tenderness: There is generalized abdominal tenderness.  Skin:    General: Skin is warm and dry.  Neurological:     Mental Status: She is alert.  Psychiatric:        Mood and Affect: Mood is not anxious.     ED Results / Procedures / Treatments   Labs (all labs ordered are listed, but only abnormal results are displayed) Labs Reviewed  COMPREHENSIVE METABOLIC PANEL - Abnormal; Notable for the following components:      Result Value   Sodium 128 (*)    CO2 15 (*)    Glucose, Bld 219 (*)    BUN  38 (*)    Creatinine, Ser 1.72 (*)    Calcium 8.8 (*)    Total Protein 5.5 (*)    Albumin 3.3 (*)    Total Bilirubin 3.8 (*)    GFR calc non Af Amer 32 (*)    GFR calc Af Amer 38 (*)    All other components within normal limits  LACTIC ACID, PLASMA - Abnormal; Notable for the following components:   Lactic Acid, Venous 2.3 (*)    All other components within normal limits  CBC WITH DIFFERENTIAL/PLATELET - Abnormal; Notable for the following components:   RBC 2.56 (*)    Hemoglobin 7.8 (*)    HCT 23.7 (*)    RDW 15.6 (*)    Platelets 75 (*)    All other components within normal limits  PROTIME-INR - Abnormal; Notable for the following components:   Prothrombin Time 20.5 (*)    INR 1.8 (*)    All other components within normal limits  URINALYSIS, ROUTINE W REFLEX MICROSCOPIC - Abnormal; Notable for the following components:   Color, Urine AMBER (*)    APPearance HAZY (*)    Hgb urine dipstick MODERATE (*)    Leukocytes,Ua LARGE (*)    WBC, UA >50 (*)    Bacteria, UA RARE (*)    All other components within normal limits  CULTURE, BLOOD (ROUTINE X 2)  CULTURE, BLOOD (ROUTINE X 2)  GRAM STAIN  SARS CORONAVIRUS 2 BY RT PCR (HOSPITAL ORDER, [facility])  CULTURE, BODY FLUID-BOTTLE  LACTIC ACID, PLASMA  LACTIC ACID, PLASMA    EKG None  Radiology IR Paracentesis  Result Date: 01/07/2020 INDICATION: Patient with history of cirrhosis, recurrent ascites. Request is made for diagnostic and therapeutic paracentesis. EXAM: ULTRASOUND GUIDED DIAGNOSTIC AND THERAPEUTIC PARACENTESIS MEDICATIONS: 10 mL 1% lidocaine COMPLICATIONS: None immediate. PROCEDURE: Informed written consent was obtained from the patient after a discussion of the risks, benefits and alternatives to treatment. A timeout was  performed prior to the initiation of the procedure. Initial ultrasound scanning demonstrates a large amount of ascites within the left lateral abdomen. The left lateral  abdomen was prepped and draped in the usual sterile fashion. 1% lidocaine was used for local anesthesia. Following this, a 19 gauge, 7-cm, Yueh catheter was introduced. An ultrasound image was saved for documentation purposes. The paracentesis was performed. The catheter was removed and a dressing was applied. The patient tolerated the procedure well without immediate post procedural complication. Patient received post-procedure intravenous albumin; see nursing notes for details. FINDINGS: A total of approximately 7.0 liters of yellow fluid was removed. Samples were sent to the laboratory as requested by the clinical team. IMPRESSION: Successful ultrasound-guided paracentesis yielding 7.0 liters of peritoneal fluid. Read by: Brynda Greathouse PA-C Electronically Signed   By: Corrie Mckusick D.O.   On: 01/07/2020 11:04    Procedures .Critical Care Performed by: Sherwood Gambler, MD Authorized by: Sherwood Gambler, MD   Critical care provider statement:    Critical care time (minutes):  35   Critical care time was exclusive of:  Separately billable procedures and treating other patients   Critical care was necessary to treat or prevent imminent or life-threatening deterioration of the following conditions:  Sepsis   Critical care was time spent personally by me on the following activities:  Discussions with consultants, evaluation of patient's response to treatment, examination of patient, ordering and performing treatments and interventions, ordering and review of laboratory studies, ordering and review of radiographic studies, pulse oximetry, re-evaluation of patient's condition, obtaining history from patient or surrogate and review of old charts   (including critical care time)  Medications Ordered in ED Medications  cefTRIAXone (ROCEPHIN) 2 g in sodium chloride 0.9 % 100 mL IVPB (0 g Intravenous Stopped 01/08/20 0855)  albumin human 25 % solution 100 g (has no administration in time range)  sodium  chloride 0.9 % bolus 1,000 mL (1,000 mLs Intravenous New Bag/Given 01/08/20 5449)    ED Course  I have reviewed the triage vital signs and the nursing notes.  Pertinent labs & imaging results that were available during my care of the patient were reviewed by me and considered in my medical decision making (see chart for details).  Clinical Course as of [name] 08 948  Wed [name] 08, 2020  0810 I discussed with the lab and they state they should be able to add on the culture to the peritoneal fluid collected yesterday morning   [SG]    Clinical Course User Index [SG] Sherwood Gambler, MD   MDM Rules/Calculators/A&P                          Patient is not ill-appearing though by her body fluid cell count she has SBP.  She was given IV Rocephin.  She will be given IV fluids and IV albumin.  She does meet criteria for sepsis with her elevated lactate in the setting of infection.  However otherwise she has soft blood pressure without frank hypotension.  She does run a little low but this is low for her.  She will need monitoring and IV antibiotics and internal medicine teaching service has been consulted for admission. Final Clinical Impression(s) / ED Diagnoses Final diagnoses:  Spontaneous bacterial peritonitis ([place])  Severe sepsis [facility])    Rx / DC Orders ED Discharge Orders    None       Sherwood Gambler, MD 01/08/20 [phone]

## 2020-01-09 DIAGNOSIS — R188 Other ascites: Secondary | ICD-10-CM

## 2020-01-09 DIAGNOSIS — K7460 Unspecified cirrhosis of liver: Secondary | ICD-10-CM

## 2020-01-09 DIAGNOSIS — K7469 Other cirrhosis of liver: Secondary | ICD-10-CM

## 2020-01-09 LAB — COMPREHENSIVE METABOLIC PANEL
ALT: 13 U/L (ref 0–44)
AST: 20 U/L (ref 15–41)
Albumin: 3.1 g/dL — ABNORMAL LOW (ref 3.5–5.0)
Alkaline Phosphatase: 57 U/L (ref 38–126)
Anion gap: 8 (ref 5–15)
BUN: 41 mg/dL — ABNORMAL HIGH (ref 6–20)
CO2: 13 mmol/L — ABNORMAL LOW (ref 22–32)
Calcium: 8.2 mg/dL — ABNORMAL LOW (ref 8.9–10.3)
Chloride: 110 mmol/L (ref 98–111)
Creatinine, Ser: 1.54 mg/dL — ABNORMAL HIGH (ref 0.44–1.00)
GFR calc Af Amer: 43 mL/min — ABNORMAL LOW (ref >60)
GFR calc non Af Amer: 37 mL/min — ABNORMAL LOW (ref >60)
Glucose, Bld: 144 mg/dL — ABNORMAL HIGH (ref 70–99)
Potassium: 4.6 mmol/L (ref 3.5–5.1)
Sodium: 131 mmol/L — ABNORMAL LOW (ref 135–145)
Total Bilirubin: 2.1 mg/dL — ABNORMAL HIGH (ref 0.3–1.2)
Total Protein: 4.6 g/dL — ABNORMAL LOW (ref 6.5–8.1)

## 2020-01-09 LAB — HEMOGLOBIN AND HEMATOCRIT, BLOOD
HCT: 26.1 % — ABNORMAL LOW (ref 36.0–46.0)
Hemoglobin: 8.5 g/dL — ABNORMAL LOW (ref 12.0–15.0)

## 2020-01-09 LAB — CBC
HCT: 20.9 % — ABNORMAL LOW (ref 36.0–46.0)
Hemoglobin: 6.7 g/dL — CL (ref 12.0–15.0)
MCH: 30.7 pg (ref 26.0–34.0)
MCHC: 32.1 g/dL (ref 30.0–36.0)
MCV: 95.9 fL (ref 80.0–100.0)
Platelets: 62 10*3/uL — ABNORMAL LOW (ref 150–400)
RBC: 2.18 MIL/uL — ABNORMAL LOW (ref 3.87–5.11)
RDW: 15.9 % — ABNORMAL HIGH (ref 11.5–15.5)
WBC: 4.4 10*3/uL (ref 4.0–10.5)
nRBC: 0.5 % — ABNORMAL HIGH (ref 0.0–0.2)

## 2020-01-09 LAB — IRON AND TIBC: Iron: 27 ug/dL — ABNORMAL LOW (ref 28–170)

## 2020-01-09 LAB — FERRITIN: Ferritin: 121 ng/mL (ref 11–307)

## 2020-01-09 LAB — CK: Total CK: 31 U/L — ABNORMAL LOW (ref 38–234)

## 2020-01-09 LAB — PREPARE RBC (CROSSMATCH)

## 2020-01-09 MED ORDER — VITAMIN K1 10 MG/ML IJ SOLN
10.0000 mg | Freq: Once | INTRAVENOUS | Status: AC
  Administered 2020-01-09: 10 mg via INTRAVENOUS
  Filled 2020-01-09: qty 1

## 2020-01-09 MED ORDER — SODIUM CHLORIDE 0.9 % IV BOLUS
500.0000 mL | Freq: Once | INTRAVENOUS | Status: AC
  Administered 2020-01-09: 500 mL via INTRAVENOUS

## 2020-01-09 MED ORDER — SENNA 8.6 MG PO TABS
1.0000 | ORAL_TABLET | Freq: Every day | ORAL | Status: DC
  Administered 2020-01-09: 8.6 mg via ORAL
  Filled 2020-01-09: qty 1

## 2020-01-09 MED ORDER — SENNOSIDES-DOCUSATE SODIUM 8.6-50 MG PO TABS
1.0000 | ORAL_TABLET | Freq: Every day | ORAL | Status: DC
  Administered 2020-01-09: 1 via ORAL
  Filled 2020-01-09: qty 1

## 2020-01-09 MED ORDER — VITAMIN K1 10 MG/ML IJ SOLN
10.0000 mg | Freq: Once | INTRAVENOUS | Status: AC
  Administered 2020-01-10: 10 mg via INTRAVENOUS
  Filled 2020-01-09: qty 1

## 2020-01-09 MED ORDER — SODIUM CHLORIDE 0.9% IV SOLUTION: Freq: Once | INTRAVENOUS | Status: DC

## 2020-01-09 NOTE — Progress Notes (Signed)
CRITICAL VALUE ALERT  Critical Value: Hgb 6.7  Date & Time Notied:  01/09/2020 0654  Provider Notified: Dr Milford Cage  Orders Received/Actions taken: awaiting new orders

## 2020-01-09 NOTE — Consult Note (Addendum)
Referring Provider: Dr. Heber Brandon Primary Care Physician:  [name], DO Primary Gastroenterologist:  Dr. Havery Moros  Reason for Consultation:  Ascites, SBP, abdominal pain  HPI: Meagan Peck is a 59 y.o. female with PMH listed below who has been following with Dr. Havery Moros since March of this year when she was diagnosed with cirrhosis (suspected NASH as workup was negative otherwise) and refractory ascites.  From his last note in June:  "Management of her ascites has been quite difficult.  On higher doses of diuretic she had hyperkalemia from Aldactone that was quite difficult to manage and required hospitalization.  She has been on Lasix monotherapy however has developed problems with hyponatremia and low potassium on 40 mg/day dosing.  On lower dose 20 mg/day her sodium and potassium have been more stable however having recurrent paracentesis with upwards of 10 L removed at a time.  She has not had any evidence of variceal bleeding but has had 2 EGDs with banding in April and in May given the large varices and risk for bleeding, and that she cannot tolerate beta-blockade due to her refractory ascites and that her resting heart rate is around 60.  She does not drink any alcohol and has never drink any significant alcohol in the past.  She states she is compliant with a low-sodium diet."  She was sent for TIPS evaluation by IR but MELD/bili/renal function was up so she was thought to not be a candidate but is actually scheduled for a [place] shunt on 9/14.  They are hoping that this will help her renal function and maybe she can ultimately get a TIPS.  She was sent to the ED by Dr. Havery Moros on 9/7 after her fluid studies from paracentesis (7 Liters removed) indicated SBP.  Has been started on Rocephin.  She tells me that Monday night she was having shaking chills and pain in her back.  Here now she says that she feels about at her baseline except for intermittent sharp abdominal  pains in her RUQ.  She says this started while she was in the ED on Tuesday.  They come and go throughout the day and night.  8/10 when they occur but only last a short time.  Her Hgb was 6.7 grams this AM from 7.8 gram on 9/7 and 8.9 grams just two weeks ago.  She denies any sign of bleeding and says that she has not had a BM since Monday.  She has received one unit of PRBCs.  She's had no evidence of HE.  Endoscopic history: EGD 09/19/19 -  - Esophageal varices noted as outlined above - 5 bands placed resulting in deflation. - Normal stomach. - Benign small polyp of the duodenum previously biopsied, not biopsied today. - Normal duodenum otherwise  EGD 08/20/19 - Grade II and large (> 5 mm) esophageal varices.  Banded x 5 - Portal hypertensive gastropathy. - Single nodule in duodenal bulb. Biopsied.  Colonoscopy 08/13/19 - A few small, nonbleeding sigmoid, descending, IC valve AVMs, were not treated or ablated. Small internal hemorrhoids.  Echo 07/25/19 - EF 60-65%   Past Medical History:  Diagnosis Date  . Acute medial meniscus tear of right knee 12/10/2015  . Allergy   . Arthritis    bil feet  . Ascites   . Cataract    bilateral - surgery to remove  . Cirrhosis ([place])   . Cirrhosis ([place])   . Diabetes mellitus without complication ([place])    type 2 - last a1c  was 5.2 per patient, no meds  . Dyspnea    when I have too much fluid  . Esophageal varices ([place])   . GERD (gastroesophageal reflux disease)   . Heart murmur   . Hepatic cirrhosis ([place])   . HLD (hyperlipidemia)    no meds  . Hypertension    no meds  . Iron deficiency anemia 09/26/2018    Past Surgical History:  Procedure Laterality Date  . BIOPSY  08/20/2019   Procedure: BIOPSY;  Surgeon: Jackquline Denmark, MD;  Location: [facility];  Service: Gastroenterology;;  . CARPAL TUNNEL RELEASE Right   . CESAREAN SECTION    . CHOLECYSTECTOMY    . COLONOSCOPY  08/2019  . ESOPHAGEAL BANDING N/A 09/19/2019   Procedure:  ESOPHAGEAL BANDING;  Surgeon: Yetta Flock, MD;  Location[facility];  Service: Gastroenterology;  Laterality: N/A;  . ESOPHAGOGASTRODUODENOSCOPY (EGD) WITH PROPOFOL N/A 08/20/2019   Procedure: ESOPHAGOGASTRODUODENOSCOPY (EGD) WITH PROPOFOL;  Surgeon: Jackquline Denmark, MD;  Location: [facility];  Service: Gastroenterology;  Laterality: N/A;  . ESOPHAGOGASTRODUODENOSCOPY (EGD) WITH PROPOFOL N/A 09/19/2019   Procedure: ESOPHAGOGASTRODUODENOSCOPY (EGD) WITH PROPOFOL;  Surgeon: Yetta Flock, MD;  [facility];  Service: Gastroenterology;  Laterality: N/A;  . EYE SURGERY Bilateral    removed cataracts  . GASTRIC VARICES BANDING N/A 08/20/2019   Procedure: GASTRIC VARICES BANDING;  Surgeon: Jackquline Denmark, MD;  Location: [facility];  Service: Gastroenterology;  Laterality: N/A;  . IR PARACENTESIS  08/01/2019  . IR PARACENTESIS  08/19/2019  . IR PARACENTESIS  09/09/2019  . IR PARACENTESIS  09/23/2019  . IR PARACENTESIS  10/08/2019  . IR PARACENTESIS  10/23/2019  . IR PARACENTESIS  11/12/2019  . IR PARACENTESIS  11/21/2019  . IR PARACENTESIS  11/25/2019  . IR PARACENTESIS  12/05/2019  . IR PARACENTESIS  12/13/2019  . IR PARACENTESIS  12/20/2019  . IR PARACENTESIS  12/26/2019  . IR PARACENTESIS  12/31/2019  . IR PARACENTESIS  01/07/2020  . IR RADIOLOGIST EVAL & MGMT  10/30/2019  . IR RADIOLOGIST EVAL & MGMT  12/11/2019  . IR RADIOLOGIST EVAL & MGMT  12/24/2019  . IR TIPS  11/25/2019  . KNEE ARTHROSCOPY Right 12/10/2015   Procedure: RIGHT KNEE ARTHROSCOPY WITH PARTIAL MEDIAL MENISCECTOMY;  Surgeon: Mcarthur Rossetti, MD;  Location: [facility];  Service: Orthopedics;  Laterality: Right;  . KNEE ARTHROSCOPY W/ MENISCAL REPAIR Bilateral   . MENISCUS REPAIR Left   . RADIOLOGY WITH ANESTHESIA N/A 11/25/2019   Procedure: TIPS;  Surgeon: Corrie Mckusick, DO;  Location: [facility];  Service: Anesthesiology;  Laterality: N/A;    Prior to Admission medications   Medication Sig Start Date End Date Taking?  Authorizing Provider  cetirizine (ZYRTEC) 10 MG tablet Take 1 tablet (10 mg total) by mouth daily. Patient taking differently: Take 10 mg by mouth daily as needed for allergies.  01/02/19  Yes [name], DO  ACCU-CHEK FASTCLIX LANCETS MISC Check fsbs TID 08/09/16   [provider]  aspirin 81 MG tablet Take 1 tablet (81 mg total) by mouth daily. Patient taking differently: Take 81 mg by mouth at bedtime.  08/30/19   Cherylann Ratel A, DO  Cholecalciferol (VITAMIN D3) 5000 UNITS TABS Take 5,000 Units by mouth daily.     [provider]  ferrous sulfate 325 (65 FE) MG EC tablet Take 1 tablet (325 mg total) by mouth 2 (two) times daily with a meal. 09/26/18   [name], DO  Krill Oil 500 MG CAPS Take 500  mg by mouth daily.    [provider]  magnesium oxide (MAG-OX) 400 MG tablet Take 1 tablet (400 mg total) by mouth 2 (two) times daily. 01/02/19   [name], DO  mupirocin ointment (BACTROBAN) 2 % Apply 1 application topically 3 (three) times daily. Patient not taking: Reported on 01/08/2020 10/22/19   Kandra Nicolas, MD    Current Facility-Administered Medications  Medication Dose Route Frequency Provider Last Rate Last Admin  . 0.9 %  sodium chloride infusion (Manually program via Guardrails IV Fluids)   Intravenous Once Asencion Noble, MD      . cefTRIAXone (ROCEPHIN) 2 g in sodium chloride 0.9 % 100 mL IVPB  2 g Intravenous Q24H Lonia Skinner M, MD 200 mL/hr at 01/09/20 1014 2 g at 01/09/20 1014  . ibuprofen (ADVIL) tablet 400 mg  400 mg Oral Q6H PRN Asencion Noble, MD   400 mg at 01/09/20 0109  . senna (SENOKOT) tablet 8.6 mg  1 tablet Oral Daily Andrew Au, MD   8.6 mg at 01/09/20 1121    Allergies as of 01/07/2020 - Review Complete 01/07/2020  Allergen Reaction Noted  . Penicillins Anaphylaxis 02/01/2013  . Atrovent [ipratropium] Palpitations 08/11/2017  . Naproxen Rash 02/01/2013  . Quinine derivatives Rash 04/14/2014  .  Sulfa antibiotics Rash 02/01/2013    Family History  Problem Relation Age of Onset  . Hypertension Mother   . COPD Mother   . Alcoholism Father   . Heart attack Father   . Diabetes Brother   . Diabetes Paternal Grandmother   . Heart disease Paternal Grandmother   . Alcoholism Paternal Uncle   . Alcoholism Maternal Uncle   . Heart disease Maternal Grandmother   . Heart disease Maternal Grandfather   . Heart disease Paternal Grandfather     Social History   Socioeconomic History  . Marital status: Married    Spouse name: Elvenia Godden  . Number of children: 1  . Years of education: Not on file  . Highest education level: Associate degree: academic program  Occupational History  . Not on file  Tobacco Use  . Smoking status: Never Smoker  . Smokeless tobacco: Never Used  Vaping Use  . Vaping Use: Never used  Substance and Sexual Activity  . Alcohol use: No  . Drug use: No  . Sexual activity: Yes    Partners: Male    Birth control/protection: None, Post-menopausal  Other Topics Concern  . Not on file  Social History Narrative  . Not on file   Social Determinants of Health   Financial Resource Strain:   . Difficulty of Paying Living Expenses: Not on file  Food Insecurity:   . Worried About Charity fundraiser in the Last Year: Not on file  . Ran Out of Food in the Last Year: Not on file  Transportation Needs:   . Lack of Transportation (Medical): Not on file  . Lack of Transportation (Non-Medical): Not on file  Physical Activity:   . Days of Exercise per Week: Not on file  . Minutes of Exercise per Session: Not on file  Stress:   . Feeling of Stress : Not on file  Social Connections:   . Frequency of Communication with Friends and Family: Not on file  . Frequency of Social Gatherings with Friends and Family: Not on file  . Attends Religious Services: Not on file  . Active Member of Clubs or Organizations: Not on file  .  Attends Archivist  Meetings: Not on file  . Marital Status: Not on file  Intimate Partner Violence:   . Fear of Current or Ex-Partner: Not on file  . Emotionally Abused: Not on file  . Physically Abused: Not on file  . Sexually Abused: Not on file   Review of Systems: ROS is O/W negative except as mentioned in HPI.  Physical Exam: Vital signs in last 24 hours: Temp:  [97.7 F (36.5 C)-98.4 F (36.9 C)] 97.8 F (36.6 C) (09/09 1146) Pulse Rate:  [81-93] 82 (09/09 1146) Resp:  [15-16] 16 (09/09 1146) BP: (85-115)/(50-66) 103/54 (09/09 1146) SpO2:  [98 %-100 %] 100 % (09/09 1146) Last BM Date: 01/06/20 General:  Alert, Well-developed, well-nourished, pleasant and cooperative in NAD Head:  Normocephalic and atraumatic. Eyes:  Sclera clear, no icterus.  Conjunctiva pink. Ears:  Normal auditory acuity. Mouth:  No deformity or lesions.   Lungs:  Clear throughout to auscultation.  No wheezes, crackles, or rhonchi.  Heart:  Regular rate and rhythm; no murmurs, clicks, rubs, or gallops. Abdomen:  Softly distended.  BS present.  Mild diffuse TTP.  Umbilical hernia noted. Msk:  Symmetrical without gross deformities. Pulses:  Normal pulses noted. Extremities:  Without clubbing or edema. Neurologic:  Alert and oriented x 4;  grossly normal neurologically. Skin:  Intact without significant lesions or rashes. Psych:  Alert and cooperative. Normal mood and affect.  Intake/Output from previous day: 09/08 0701 - 09/09 0700 In: 1200 [P.O.:600; IV Piggyback:600] Out: 500 [Urine:500]  Lab Results: Recent Labs    01/07/20 1932 01/09/20 0546  WBC 9.3 4.4  HGB 7.8* 6.7*  HCT 23.7* 20.9*  PLT 75* 62*   BMET Recent Labs    01/07/20 1932 01/09/20 0546  NA 128* 131*  K 5.1 4.6  CL 104 110  CO2 15* 13*  GLUCOSE 219* 144*  BUN 38* 41*  CREATININE 1.72* 1.54*  CALCIUM 8.8* 8.2*   LFT Recent Labs    01/09/20 0546  PROT 4.6*  ALBUMIN 3.1*  AST 20  ALT 13  ALKPHOS 57  BILITOT 2.1*   PT/INR  Recent Labs    01/07/20 1932  LABPROT 20.5*  INR 1.8*   IMPRESSION:  *Decompensated cirrhosis with ascites- patient with a relatively recent diagnosis of cirrhosis in March that has progressed quickly with refractory ascites and large esophageal varices.  The ascites has been quite difficult to manage, as above she was admitted with hyperkalemia from Aldactone, and Lasix has led to hypokalemia and hyponatremia at times.  She only tolerates low-dose Lasix but this clearly not enough to keep her ascites at [place].  She has required frequent large-volume paracentesis despite compliance with a low-sodium diet.  Further, she is not a candidate for nadolol or propranolol given her refractory ascites.  Not a candidate for TIPS currently due to increased MELD/bili/Cr.  Is scheduled for [place] shunt on 9/14, but they are hoping will help her renal function and ultimately allow for a TIPS.  MELD score was 28, calculated by labs on 9/7. *Esophageal varices:  These have been managed with esophageal banding x 2, last done in May.  Plan was to repeat EGD in July unless she underwent TIPS then would no longer be needed. *SBP:  Fluid studies positive for SBP on 9/7.  On Rocephin. *Anemia:  Hgb 6.7 grams this AM from 7.8 gram on 9/7 and 8.9 grams just two weeks ago.  Received one unit of PRBCs today.  No sign of  bleeding, no BM since Monday and it was normal at that time. *Intermittent RUQ abdominal pain:  Could be a combination of things including ascites, SBP. *Hyponatremia:  Na+ is 131 today. *Coagulopathy:  INR 1.8 on admission. *Thrombocytopenia:  Platelets 62 today.  PLAN: *? EGD inpatient.  Will place her NPO after midnight in case we proceed tomorrow, 9/10. *Monitor CBC, renal function, INR, electrolytes, etc. *Continue Rocephin. *Needs diagnostic paracentesis preferable Saturday, 9/11, after 3 full days of abx to be sure that cell counts are going down.  If they are unable to do it on the weekend then  could consider for 9/10 afternoon. *[place] shunt will likely need to be postponed until her SBP has resolved.  May want to address this with IR/Dr. Earleen Newport.  Laban Emperor. Brahim Dolman  01/09/2020, 12:28 PM

## 2020-01-09 NOTE — Progress Notes (Addendum)
Paged regarding BP 85/50, patient asymptomatic. Baseline SBP appears to be 110's. She is evaluated at bedside. She is lying comfortably in bed. She denies any headaches, dizziness/lightheadedness, acute vision changes, chest pain or worsening dyspnea. BP 85/50, HR 91, SpO2 98% on RR, RR 18. On examination, patient is resting comfortably in bed. Cardiovascular exam with RRR, no m/r/g, S1 and S2 present and distal pulses intact. Does have mild tachypnea but she reports that this is at baseline since admission. Does have abdominal distension without any acute changes. Minimal lower extremity edema.   On further evaluation, noted to have infiltrated IV and likely that she did not receive 500cc bolus previously ordered. RN to bolus from other access site and repeat blood pressure. If becomes symptomatic, RN to page again. Will continue to monitor.  Differential includes sepsis 2/2 SBP, cirrhosis, primary parasympathetic effect.  Patient is on ceftriaxone for SBP with lactic acid normalized on repeat. She is afebrile and without leukocytosis on labs. She does have history of cirrhosis and, as such, may have lower BP at baseline. Given that she was sleeping during the hypotensive event, likely that her hypotension is in setting of primary parasympathetic response.   ADDENDUM: BP improved to 95/50 after gentle diuresis. Will continue to monitor vitals.   Harvie Heck, MD Internal Medicine, PGY-2 01/09/20 12:46 AM Pager # [phone]

## 2020-01-09 NOTE — Plan of Care (Signed)

## 2020-01-09 NOTE — Progress Notes (Signed)
CRITICAL VALUE ALERT  Critical Value:  Peritoneal fluid gram - rods  Date & Time Notied:  9/9 0100  Provider Notified: Dr. Carmin Muskrat  Orders Received/Actions taken: New orders given for bp 95/50

## 2020-01-09 NOTE — Progress Notes (Signed)
Notified by RN Verdene Lennert that patient's hemoglobin was 6.7 this morning. Down from 7.8 yesterday. Patient seen at bedside and notes worsening upper abdominal pain that started at approximately 0300 am. She notes the pain runs across the upper part of her abdomen and occurs if she sits up in the bed. She notes the pain improves with lying flat. She denies having this pain since her admission.   She has no other complaints at the time of my examination. She noted that her nurse said her urine was "dark" this morning. Daytime team notified.

## 2020-01-09 NOTE — Progress Notes (Signed)
Subjective:   Patient interviewed at bedside. She reports a new "jaggedy" intermittent right upper quadrant abdominal pain with onset after breakfast this morning around 0830. She rates the intensity as an 8/10. She states she has not had a bowel movement recently but continues to pass gas. She denies any nausea or vomiting and states that she is able to keep PO intake down. She states this pain is different than her previous pain and has never had similar pain in the past. She states that her abdomen is just slightly more swollen than usual but not by much. She denies any known sources of bleeding. She denies any new symptoms including CP, worsening of her SOB, lightheadedness, or dizziness. She does note that she's had mild lower extremity swelling over the past several months but denies swelling anywhere else. She does note her umbilical hernia has gotten larger but denies any redness, pain, or other changes to this area. She does not take anything for constipation at home as she says she usually has 1-2 stools per day. She denies any troubles urinating, although nursing staff note her urine has darkened from an amber to tea color. No hx bowel obstruction, pancreatitis, although she notes a history of cholecystectomy.   Objective:  Vital signs in last 24 hours: Vitals:   01/09/20 1115 01/09/20 1146 01/09/20 1328 01/09/20 1343  BP: (!) 97/56 (!) 103/54 (!) 96/58 (!) 100/54  Pulse: 81 82 83 83  Resp: 16 16 20 18   Temp: 97.7 F (36.5 C) 97.8 F (36.6 C) 98.2 F (36.8 C) (!) 97.4 F (36.3 C)  TempSrc: Oral Oral Oral Oral  SpO2: 100% 100% 100% 100%   General: Patient appears chronically ill. No acute distress.  Eyes: Mild scleral icterus present. No conjunctival injection.  Respiratory: Lung sounds are decreased at the bases bilaterally with bibasilar crackles. No wheezing or rhonchi.  Cardiovascular: Regular rate and rhythm. No murmurs, rubs, or gallops. There is 1+ bilateral pitting edema.   Abdominal: There is significant RUQ and suprapubic abdominal tenderness with voluntary guarding. No rebound. Negative Murphy's sign. Bowel sounds hyperactive. There is worsening abdominal distention with visualized superficial veins. Reducible umbilical hernia present without surrounding erythema or tenderness. Skin: Skin appears jaundiced. There are purpura along the forearms without active bleeding of the skin. No rashes or other lesions noted. Psych: Normal affect. Normal tone of voice.   Assessment/Plan:  Active Problems:   SBP (spontaneous bacterial peritonitis) ([place])  # Refractory, Decompensated Cirrhosis 2/2 DM vs. Cryptogenic, Complicated by Active SBP, recurrent, large volume ascites, large esophageal varices, and thrombocytopenia Patient was admitted 01/07/20 at the request of her GI physician (Dr. Enis Gash) for workup and management of abdominal pain, bloating and decreased appetite in the setting of SBP. Patient was first diagnosed with cirrhosis around March of this year and receives weekly paracentesis. Paracentesis fluid from 01/07/20 grew gram negative aerobic rods. Worsening abdominal ascites noted on exam, s/p albumin infusion (and fluid bolus overnight). Platelets down from 115 --> 75. Hgb down from 8.9 --> 6.7, with no active signs of bleeding. Albumin 3.3. TB improving from 3.8 --> 2.1. PT/INR 20.5/1.8. - Given new/different RUQ pain today, consulted GI - appreciate their recs - Will give albumin and paracentesis as needed for ascites; avoiding fluid boluses - Continue rocephin for SBP - Will check paracentesis fluid albumin for SAAG  - Continue to monitor daily CMP and CBC  - Patient will need [place] Shunt after admission (?scheduled for 9/14)  # Worsening Normocytic  Anemia Hemoglobin 7.8 --> 6.7, with baseline > 9. Does have large varices but  - Will give 1 unit PRBC and follow post-transfusion CBC - Will check iron panel   # Acute Kidney Injury # Asymptomatic  Bacteriuria # Non-anion gap Metabolic Acidosis Creatinine initially 1.72 with baseline ~ 1.37. BUN 41, GFR 37. Bicarb 15 --> 13. 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 U/A hazy with large leukocytes and rare bacteria, >50 WBC, but no urinary symptoms or CVA tenderness concerning for treatable infection. Urine turned from amber to tea-colored today. U/A showed moderate hemoglobinuria but no RBC's. May be secondary to hyperbilirubinemia vs. Rhabdomyolysis.  - Will check CK - Continue to monitor daily CMP - May consider nephrology consultation tomorrow  # Diet-Controlled Type II DM Patient had recently been taken off Metformin due to low Hgb A1c of 5.2. BG slightly elevated today.  - Will continue to monitor and initiate SSI if BG remain elevated   # Hypotension [name] blood pressure was in the 80's overnight. She has been given albumin and was given a 1L NS bolus overnight. - Will attempt to avoid fluids as much as possible given cirrhosis - Continue to monitor closely - Consider albumin if hypotension worsens - Will need at least 1g/kg albumin tomorrow due to elevated Cr in setting of SBP   # Hyperkalemia, Resolved - K+ 5.1 --> 4.6, continue to monitor   Barriers to Discharge: Cirrhosis with recurrent ascites and SBP  Jeralyn Bennett, MD 01/09/2020, 2:53 PM Pager: [phone] After 5pm on weekdays and 1pm on weekends: On Call pager [phone]

## 2020-01-10 ENCOUNTER — Inpatient Hospital Stay (HOSPITAL_COMMUNITY): Payer: No Typology Code available for payment source

## 2020-01-10 DIAGNOSIS — I959 Hypotension, unspecified: Secondary | ICD-10-CM

## 2020-01-10 DIAGNOSIS — I8510 Secondary esophageal varices without bleeding: Secondary | ICD-10-CM

## 2020-01-10 DIAGNOSIS — E1122 Type 2 diabetes mellitus with diabetic chronic kidney disease: Secondary | ICD-10-CM

## 2020-01-10 DIAGNOSIS — E872 Acidosis: Secondary | ICD-10-CM

## 2020-01-10 DIAGNOSIS — N179 Acute kidney failure, unspecified: Secondary | ICD-10-CM

## 2020-01-10 DIAGNOSIS — K5900 Constipation, unspecified: Secondary | ICD-10-CM

## 2020-01-10 DIAGNOSIS — D649 Anemia, unspecified: Secondary | ICD-10-CM

## 2020-01-10 DIAGNOSIS — D631 Anemia in chronic kidney disease: Secondary | ICD-10-CM

## 2020-01-10 DIAGNOSIS — N1831 Chronic kidney disease, stage 3a: Secondary | ICD-10-CM

## 2020-01-10 DIAGNOSIS — Z9889 Other specified postprocedural states: Secondary | ICD-10-CM

## 2020-01-10 DIAGNOSIS — B9689 Other specified bacterial agents as the cause of diseases classified elsewhere: Secondary | ICD-10-CM

## 2020-01-10 DIAGNOSIS — E875 Hyperkalemia: Secondary | ICD-10-CM

## 2020-01-10 HISTORY — PX: IR PARACENTESIS: IMG2679

## 2020-01-10 LAB — COMPREHENSIVE METABOLIC PANEL
ALT: 14 U/L (ref 0–44)
AST: 24 U/L (ref 15–41)
Albumin: 2.9 g/dL — ABNORMAL LOW (ref 3.5–5.0)
Alkaline Phosphatase: 79 U/L (ref 38–126)
Anion gap: 8 (ref 5–15)
BUN: 40 mg/dL — ABNORMAL HIGH (ref 6–20)
CO2: 15 mmol/L — ABNORMAL LOW (ref 22–32)
Calcium: 8.4 mg/dL — ABNORMAL LOW (ref 8.9–10.3)
Chloride: 108 mmol/L (ref 98–111)
Creatinine, Ser: 1.37 mg/dL — ABNORMAL HIGH (ref 0.44–1.00)
GFR calc Af Amer: 49 mL/min — ABNORMAL LOW (ref >60)
GFR calc non Af Amer: 43 mL/min — ABNORMAL LOW (ref >60)
Glucose, Bld: 130 mg/dL — ABNORMAL HIGH (ref 70–99)
Potassium: 4.5 mmol/L (ref 3.5–5.1)
Sodium: 131 mmol/L — ABNORMAL LOW (ref 135–145)
Total Bilirubin: 2.5 mg/dL — ABNORMAL HIGH (ref 0.3–1.2)
Total Protein: 4.9 g/dL — ABNORMAL LOW (ref 6.5–8.1)

## 2020-01-10 LAB — CBC
HCT: 25.6 % — ABNORMAL LOW (ref 36.0–46.0)
Hemoglobin: 8.4 g/dL — ABNORMAL LOW (ref 12.0–15.0)
MCH: 30.4 pg (ref 26.0–34.0)
MCHC: 32.8 g/dL (ref 30.0–36.0)
MCV: 92.8 fL (ref 80.0–100.0)
Platelets: 77 10*3/uL — ABNORMAL LOW (ref 150–400)
RBC: 2.76 MIL/uL — ABNORMAL LOW (ref 3.87–5.11)
RDW: 15.5 % (ref 11.5–15.5)
WBC: 5.1 10*3/uL (ref 4.0–10.5)
nRBC: 0 % (ref 0.0–0.2)

## 2020-01-10 LAB — BODY FLUID CELL COUNT WITH DIFFERENTIAL
Eos, Fluid: 3 %
Lymphs, Fluid: 19 %
Monocyte-Macrophage-Serous Fluid: 8 % — ABNORMAL LOW (ref 50–90)
Neutrophil Count, Fluid: 70 % — ABNORMAL HIGH (ref 0–25)
Total Nucleated Cell Count, Fluid: 142 cu mm (ref 0–1000)

## 2020-01-10 LAB — TYPE AND SCREEN
ABO/RH(D): A POS
Antibody Screen: NEGATIVE
Unit division: 0

## 2020-01-10 LAB — GRAM STAIN

## 2020-01-10 LAB — BPAM RBC
Blood Product Expiration Date: 202110012359
ISSUE DATE / TIME: 202109091126
Unit Type and Rh: 6200

## 2020-01-10 LAB — PROTIME-INR
INR: 1.6 — ABNORMAL HIGH (ref 0.8–1.2)
INR: 1.4 — ABNORMAL HIGH (ref 0.8–1.2)
Prothrombin Time: 18.3 seconds — ABNORMAL HIGH (ref 11.4–15.2)
Prothrombin Time: 16.5 seconds — ABNORMAL HIGH (ref 11.4–15.2)

## 2020-01-10 MED ORDER — ALBUMIN HUMAN 25 % IV SOLN
100.0000 g | Freq: Once | INTRAVENOUS | Status: AC
  Administered 2020-01-10: 100 g via INTRAVENOUS
  Filled 2020-01-10 (×3): qty 400

## 2020-01-10 MED ORDER — LIDOCAINE HCL (PF) 1 % IJ SOLN
INTRAMUSCULAR | Status: AC | PRN
  Administered 2020-01-10: 10 mL

## 2020-01-10 MED ORDER — ACETAMINOPHEN 325 MG PO TABS
325.0000 mg | ORAL_TABLET | Freq: Four times a day (QID) | ORAL | Status: DC | PRN
  Administered 2020-01-10 – 2020-01-19 (×5): 325 mg via ORAL
  Filled 2020-01-10 (×7): qty 1

## 2020-01-10 MED ORDER — SENNOSIDES-DOCUSATE SODIUM 8.6-50 MG PO TABS: 1.0000 | ORAL_TABLET | Freq: Every day | ORAL | Status: DC | PRN

## 2020-01-10 MED ORDER — LIDOCAINE HCL 1 % IJ SOLN
INTRAMUSCULAR | Status: AC
  Filled 2020-01-10: qty 20

## 2020-01-10 NOTE — Consult Note (Signed)
   [facility] Inpatient Consult   01/10/2020  Meagan Peck 01/24/1961 [id number]   [place]  Accountable Care Organization [ACO] Patient:  [name]   Patient has been assigned to a RN [place] Management service for post hospital follow up telephonic outreach for support and transition of care needs.   Plan:  Continue to follow progress and disposition to assess for post hospital care management needs.    Please place a [facility] Care Management consult as appropriate and for questions contact:   Natividad Brood, RN BSN [facility] Liaison  [phone] business mobile phone Toll free office [phone]  Fax number: [phone] [email or web address]

## 2020-01-10 NOTE — Progress Notes (Signed)
Subjective:   Patient states that her abdominal pain significantly improved after having 2 bowel movements since yesterday. These BMs were normal in consistency for her and she denies any dark stool or BRB. She does report increased abdominal distention, but states that her swelling is not at a level in which she has required paracentesis in the past. She endorses mild chills last night that have since resolved, and denies fever, SOB, lightheadedness, and dizziness.  Objective:  Vital signs in last 24 hours: Vitals:   01/09/20 1328 01/09/20 1343 01/09/20 2035 01/10/20 0628  BP: (!) 96/58 (!) 100/54 (!) 112/55 107/63  Pulse: 83 83 91 86  Resp: 20 18 16 17   Temp: 98.2 F (36.8 C) (!) 97.4 F (36.3 C) 99.5 F (37.5 C) 97.8 F (36.6 C)  TempSrc: Oral Oral Oral Oral  SpO2: 100% 100% 100% 100%   General: Patient appears chronically ill. No acute distress.  Respiratory: Lung sounds clear to auscultation bilaterally without wheezing or rhonchi. Cardiovascular: Regular rate and rhythm. No murmurs, rubs, or gallops. There is 1-2+ bilateral pitting edema, consistent with examination from yesterday. Abdominal: There is significant abdominal distention, increased from previous exam. There is mild upper abdominal tenderness and minimal tenderness over her protruding but reducible umbilical hernia, that has improved since examination yesterday. No guarding or rebound. Bowel sounds normal. Musculoskeletal: Extremities appear mildly cachectic.  Skin: Skin appears mildly jaundiced. There are purpura along the forearms without active bleeding of the skin. No rashes or other lesions noted. Psych: Normal affect. Normal tone of voice.   Assessment/Plan:  Active Problems:   SBP (spontaneous bacterial peritonitis) ([place])  # Refractory, Decompensated Cirrhosis 2/2 NAFLD vs. Cryptogenic, Complicated by Active SBP, recurrent large volume ascites, large esophageal varices s/p banding, and  thrombocytopenia Patient was admitted 01/07/20 at the request of her GI physician (Dr. Enis Gash) for workup and management of abdominal pain, bloating and decreased appetite in the setting of SBP. Patient was first diagnosed with cirrhosis around March of this year and receives weekly paracentesis. Paracentesis fluid from 01/07/20 grew gram negative aerobic rods. IR note that patient is followed by Dr. Earleen Newport and had been scheduled for [place] shunt 01/14/20, but now are favoring TIPS procedure given improvement in MELD score here. Thrombocytopenia stable at 77. Hgb improved from 6.7 yesterday to 8.4 today, s/p 1 unit PRBC 01/09/20, with no active signs of bleeding. Albumin 2.9. PT/INR elevated but stable. Hyperbilirubinemia with TB 2.5. - GI consulted, appreciate their recommendations - Spoke with IR, patient will get diagnostic paracentesis with cell counts, gram stain and culture to monitor for improvement of SBP on Rocephin  - Will drain up to 1L ascites given progression, with repeat paracentesis ~01/12/20 - Continue to monitor daily CMP and CBC  - Will discuss possibility of TIPS procedure with IR as SBP improves - Will hold off on EGD until SBP improves.  # Stable Normocytic Anemia Hemoglobin 6.7 on 9/9. Patient given 1 U PRBC with improvement in Hgb to 8.5, now stable at 8.4. Baseline ~ 9. Iron panel shows low iron 27, normal ferritin 121, and saturation ratios unable to be calculated. Does have large volume ascites but no evidence of active bleeding. - Continue to monitor daily CBC's - Will hold on iron replacement therapy given active infection; consider therapy vs. repeat saturation ratios once SBP resolves  # AKI, Resolved # Asymptomatic Bacteriuria # Non-anion gap Metabolic Acidosis Creatinine initially 1.72 with baseline ~ 1.37. BUN 41, GFR 37. Bicarb 15 --> 13.  Suspect more recent CKD stage IIIa, although Creatinine has returned to baseline. Likely due to type IV RTA 2/2 hyperkalemia on  spironolactone; however, even off diuretic therapy she continues to have NAGMA. U/A hazy with large leukocytes and rare bacteria, >50 WBC, but no urinary symptoms or CVA tenderness concerning for treatable infection. U/A showed moderate hemoglobinuria but no RBC's. CK WNL. - Continue to monitor daily CMP  # Diet-Controlled Type II DM Patient had recently been taken off Metformin due to low Hgb A1c of 5.2. BG slightly elevated today.  - Will continue to monitor and initiate SSI if BG remain elevated   # Hypotension [name] blood pressuer has improved to the low 517'G systolic with albumin 017C. - Will attempt to avoid fluids as much as possible given cirrhosis - Continue to monitor closely - Consider additional albumin vs. Paracentesis if needed hypotension / ascites worsen  # Constipation, Resolved  [name] abdominal pain has significantly improved after having 2 normal bowel movements without blood since yesterday on Senakot daily. - Switch to Senakot PRN  # Hyperkalemia, Resolved - K+ 5.1 --> 4.6, continue to monitor   Barriers to Discharge: Cirrhosis with recurrent ascites and SBP  Andrew Au, MD 01/10/2020, 10:16 AM Pager: [phone] After 5pm on weekdays and 1pm on weekends: On Call pager [phone]

## 2020-01-10 NOTE — Procedures (Signed)
PROCEDURE SUMMARY:  Successful US guided paracentesis from RLQ.  Yielded 1 L of clear amber fluid.  No immediate complications.  Pt tolerated well.   Specimen was sent for labs.  EBL < 59m  KAscencion DikePA-C 01/10/2020 3:35 PM

## 2020-01-10 NOTE — Progress Notes (Signed)
Chief Complaint: Patient was seen in consultation today for refractory ascites  Supervising Physician: Corrie Mckusick  Patient Status: [name]  History of Present Illness: Meagan Peck is a 59 y.o. female well known to IR with hx of refractory ascites. Had attempted TIPS procedure 11/25/19 but was not successful. She then had an outpt MRI on 8/11 to further assess her anatomy in preparation for repeat attempt at TIPS. This was scheduled but had to be cancelled as pt labs/MELD score worsened to 27-28. She was seen in consult by Dr. Earleen Newport on 8/24 and after long discussion, she was to be scheduled for [facility] shunt next week but has now been admitted with concerns for SBP and acute on chronic anemia. IR made aware of pts admission and for request for repeat paracentesis. PMHx, meds, labs, imaging, allergies reviewed.     Past Medical History:  Diagnosis Date  . Acute medial meniscus tear of right knee 12/10/2015  . Allergy   . Arthritis    bil feet  . Ascites   . Cataract    bilateral - surgery to remove  . Cirrhosis ([place])   . Cirrhosis ([place]   . Diabetes mellitus without complication ([place])    type 2 - last a1c was 5.2 per patient, no meds  . Dyspnea    when I have too much fluid  . Esophageal varices ([place])   . GERD (gastroesophageal reflux disease)   . Heart murmur   . Hepatic cirrhosis ([place])   . HLD (hyperlipidemia)    no meds  . Hypertension    no meds  . Iron deficiency anemia 09/26/2018    Past Surgical History:  Procedure Laterality Date  . BIOPSY  08/20/2019   Procedure: BIOPSY;  Surgeon: Jackquline Denmark, MD;  Location: [place];  Service: Gastroenterology;;  . CARPAL TUNNEL RELEASE Right   . CESAREAN SECTION    . CHOLECYSTECTOMY    . COLONOSCOPY  08/2019  . ESOPHAGEAL BANDING N/A 09/19/2019   Procedure: ESOPHAGEAL BANDING;  Surgeon: Yetta Flock, MD;  [facility];  Service: Gastroenterology;  Laterality: N/A;  .  ESOPHAGOGASTRODUODENOSCOPY (EGD) WITH PROPOFOL N/A 08/20/2019   Procedure: ESOPHAGOGASTRODUODENOSCOPY (EGD) WITH PROPOFOL;  Surgeon: Jackquline Denmark, MD;  Location: [place];  Service: Gastroenterology;  Laterality: N/A;  . ESOPHAGOGASTRODUODENOSCOPY (EGD) WITH PROPOFOL N/A 09/19/2019   Procedure: ESOPHAGOGASTRODUODENOSCOPY (EGD) WITH PROPOFOL;  Surgeon: Yetta Flock, MD;  [facility];  Service: Gastroenterology;  Laterality: N/A;  . EYE SURGERY Bilateral    removed cataracts  . GASTRIC VARICES BANDING N/A 08/20/2019   Procedure: GASTRIC VARICES BANDING;  Surgeon: Jackquline Denmark, MD;  Location: [place];  Service: Gastroenterology;  Laterality: N/A;  . IR PARACENTESIS  08/01/2019  . IR PARACENTESIS  08/19/2019  . IR PARACENTESIS  09/09/2019  . IR PARACENTESIS  09/23/2019  . IR PARACENTESIS  10/08/2019  . IR PARACENTESIS  10/23/2019  . IR PARACENTESIS  11/12/2019  . IR PARACENTESIS  11/21/2019  . IR PARACENTESIS  11/25/2019  . IR PARACENTESIS  12/05/2019  . IR PARACENTESIS  12/13/2019  . IR PARACENTESIS  12/20/2019  . IR PARACENTESIS  12/26/2019  . IR PARACENTESIS  12/31/2019  . IR PARACENTESIS  01/07/2020  . IR RADIOLOGIST EVAL & MGMT  10/30/2019  . IR RADIOLOGIST EVAL & MGMT  12/11/2019  . IR RADIOLOGIST EVAL & MGMT  12/24/2019  . IR TIPS  11/25/2019  . KNEE ARTHROSCOPY Right 12/10/2015   Procedure: RIGHT KNEE ARTHROSCOPY WITH PARTIAL  MEDIAL MENISCECTOMY;  Surgeon: Mcarthur Rossetti, MD;  Location: [facility];  Service: Orthopedics;  Laterality: Right;  . KNEE ARTHROSCOPY W/ MENISCAL REPAIR Bilateral   . MENISCUS REPAIR Left   . RADIOLOGY WITH ANESTHESIA N/A 11/25/2019   Procedure: TIPS;  Surgeon: Corrie Mckusick, DO;  Location: [place];  Service: Anesthesiology;  Laterality: N/A;    Allergies: Penicillins, Atrovent [ipratropium], Naproxen, Quinine derivatives, and Sulfa antibiotics  Medications:  Current Facility-Administered Medications:  .  0.9 %  sodium chloride infusion  (Manually program via Guardrails IV Fluids), , Intravenous, Once, Asencion Noble, MD .  acetaminophen (TYLENOL) tablet 325 mg, 325 mg, Oral, Q6H PRN, Joni Reining C, DO .  cefTRIAXone (ROCEPHIN) 2 g in sodium chloride 0.9 % 100 mL IVPB, 2 g, Intravenous, Q24H, Asencion Noble, MD, Last Rate: 200 mL/hr at 01/10/20 0854, 2 g at 01/10/20 0854 .  lidocaine (XYLOCAINE) 1 % (with pres) injection, , , ,  .  senna-docusate (Senokot-S) tablet 1 tablet, 1 tablet, Oral, Daily PRN, Andrew Au, MD    Family History  Problem Relation Age of Onset  . Hypertension Mother   . COPD Mother   . Alcoholism Father   . Heart attack Father   . Diabetes Brother   . Diabetes Paternal Grandmother   . Heart disease Paternal Grandmother   . Alcoholism Paternal Uncle   . Alcoholism Maternal Uncle   . Heart disease Maternal Grandmother   . Heart disease Maternal Grandfather   . Heart disease Paternal Grandfather     Social History   Socioeconomic History  . Marital status: Married    Spouse name: Meagan Peck  . Number of children: 1  . Years of education: Not on file  . Highest education level: Associate degree: academic program  Occupational History  . Not on file  Tobacco Use  . Smoking status: Never Smoker  . Smokeless tobacco: Never Used  Vaping Use  . Vaping Use: Never used  Substance and Sexual Activity  . Alcohol use: No  . Drug use: No  . Sexual activity: Yes    Partners: Male    Birth control/protection: None, Post-menopausal  Other Topics Concern  . Not on file  Social History Narrative  . Not on file   Social Determinants of Health   Financial Resource Strain:   . Difficulty of Paying Living Expenses: Not on file  Food Insecurity:   . Worried About Charity fundraiser in the Last Year: Not on file  . Ran Out of Food in the Last Year: Not on file  Transportation Needs:   . Lack of Transportation (Medical): Not on file  . Lack of Transportation (Non-Medical): Not  on file  Physical Activity:   . Days of Exercise per Week: Not on file  . Minutes of Exercise per Session: Not on file  Stress:   . Feeling of Stress : Not on file  Social Connections:   . Frequency of Communication with Friends and Family: Not on file  . Frequency of Social Gatherings with Friends and Family: Not on file  . Attends Religious Services: Not on file  . Active Member of Clubs or Organizations: Not on file  . Attends Archivist Meetings: Not on file  . Marital Status: Not on file    Review of Systems: A 12 point ROS discussed and pertinent positives are indicated in the HPI above.  All other systems are negative.  Review of Systems  Vital  Signs: BP 102/65 (BP Location: Right Arm)   Pulse 70   Temp 97.8 F (36.6 C) (Oral)   Resp 18   LMP 12/03/2013 (LMP Unknown)   SpO2 100%   Physical Exam Constitutional:      General: She is not in acute distress.    Appearance: She is well-developed. She is not ill-appearing.  HENT:     Mouth/Throat:     Mouth: Mucous membranes are moist.     Pharynx: Oropharynx is clear.  Cardiovascular:     Rate and Rhythm: Normal rate and regular rhythm.     Heart sounds: Normal heart sounds.  Pulmonary:     Effort: Pulmonary effort is normal. No respiratory distress.     Breath sounds: Normal breath sounds.  Abdominal:     General: There is distension.     Palpations: Abdomen is soft.     Tenderness: There is no abdominal tenderness. There is no guarding.  Skin:    General: Skin is warm and dry.     Coloration: Skin is not jaundiced.  Neurological:     General: No focal deficit present.     Mental Status: She is alert and oriented to person, place, and time.  Psychiatric:        Mood and Affect: Mood normal.        Thought Content: Thought content normal.        Judgment: Judgment normal.     Imaging:  IR Paracentesis  Result Date: 01/07/2020 INDICATION: Patient with history of cirrhosis, recurrent ascites.  Request is made for diagnostic and therapeutic paracentesis. EXAM: ULTRASOUND GUIDED DIAGNOSTIC AND THERAPEUTIC PARACENTESIS MEDICATIONS: 10 mL 1% lidocaine COMPLICATIONS: None immediate. PROCEDURE: Informed written consent was obtained from the patient after a discussion of the risks, benefits and alternatives to treatment. A timeout was performed prior to the initiation of the procedure. Initial ultrasound scanning demonstrates a large amount of ascites within the left lateral abdomen. The left lateral abdomen was prepped and draped in the usual sterile fashion. 1% lidocaine was used for local anesthesia. Following this, a 19 gauge, 7-cm, Yueh catheter was introduced. An ultrasound image was saved for documentation purposes. The paracentesis was performed. The catheter was removed and a dressing was applied. The patient tolerated the procedure well without immediate post procedural complication. Patient received post-procedure intravenous albumin; see nursing notes for details. FINDINGS: A total of approximately 7.0 liters of yellow fluid was removed. Samples were sent to the laboratory as requested by the clinical team. IMPRESSION: Successful ultrasound-guided paracentesis yielding 7.0 liters of peritoneal fluid. Read by: Brynda Greathouse PA-C Electronically Signed   By: Corrie Mckusick D.O.   On: 01/07/2020 11:04     Labs:  CBC: Recent Labs    12/20/19 0624 12/20/19 0624 01/07/20 1932 01/09/20 0546 01/09/20 1720 01/10/20 0717  WBC 6.2  --  9.3 4.4  --  5.1  HGB 8.9*   < > 7.8* 6.7* 8.5* 8.4*  HCT 27.4*   < > 23.7* 20.9* 26.1* 25.6*  PLT 115*  --  75* 62*  --  77*   < > = values in this interval not displayed.    COAGS: Recent Labs    11/25/19 0630 11/25/19 0630 12/20/19 0624 01/07/20 1932 01/10/20 0434 01/10/20 0717  INR 1.5*   < > 1.4* 1.8* 1.6* 1.4*  APTT 35  --   --   --   --   --    < > = values in this interval  not displayed.    BMP: Recent Labs    12/20/19 0624  12/20/19 0624 12/31/19 1229 01/07/20 1932 01/09/20 0546 01/10/20 0717  NA 127*   < > 128* 128* 131* 131*  K 4.9   < > 5.2* 5.1 4.6 4.5  CL 104   < > 103 104 110 108  CO2 12*   < > 14* 15* 13* 15*  GLUCOSE 145*   < > 125* 219* 144* 130*  BUN 41*   < > 36* 38* 41* 40*  CALCIUM 8.6*   < > 9.1 8.8* 8.2* 8.4*  CREATININE 1.79*   < > 1.53* 1.72* 1.54* 1.37*  GFRNONAA 31*  --   --  32* 37* 43*  GFRAA 36*  --   --  38* 43* 49*   < > = values in this interval not displayed.    LIVER FUNCTION TESTS: Recent Labs    12/20/19 0624 01/07/20 1932 01/09/20 0546 01/10/20 0717  BILITOT 2.9* 3.8* 2.1* 2.5*  AST 35 33 20 24  ALT 26 21 13 14   ALKPHOS 124 81 57 79  PROT 5.9* 5.5* 4.6* 4.9*  ALBUMIN 2.7* 3.3* 3.1* 2.9*    TUMOR MARKERS: Recent Labs    07/31/19 1154  AFPTM 2.3    Assessment and Plan: Refractory ascites Known to IR team, have been trying to get TIPS accomplished. Remains scheduled for [place] shunt next week but in review of chart, labs are improved, and current MELD is 22. E.Coli positive ascites from 9/7, has been on IV abx. Repeated limited paracentesis today of 1 L, sent for repeat labs/cx Discussion with Dr. Earleen Newport as well as pt. If repeat para/labs improve showing resolution of SBP, and labs/MELD remain favorable, would prefer to re-attempt TIPS procedure next week instead of [place] Shunt Pt agreeable to this plan. IR following closely and will begin making tentative plans for TIPS next week Tues  Thank you for this interesting consult.  I greatly enjoyed meeting Amandine P Dykstra and look forward to participating in their care.  A copy of this report was sent to the requesting provider on this date.  Electronically Signed: Ascencion Dike, PA-C 01/10/2020, 3:35 PM   I spent a total of 30 minutes in face to face in clinical consultation, greater than 50% of which was counseling/coordinating care for TIPS consultation

## 2020-01-10 NOTE — Progress Notes (Signed)
Progress Note  Chief Complaint:    Ascites, SBP     ASSESSMENT / PLAN:    # Decompensated cirrhosis with ascites / SBP. Medium to large esophageal varices banded in May 2021 --Platelets 77. INR 1.4.  --Hyponatremia improving. Na 131 --Diuretic intolerant ( electrolytes disturbances) --On 9/7 ascitic fluid + SBP. Total count 3,306 k with 75% neutrophils. Getting Rocephin --Requiring frequent LVP. Not TIPS candidate with high MELD / bilirubin / elevated Creatinine. Cr improving 1.5 >>> 1.37. Tbili 2.5.  Scheduled for [place] shunt pending improvement / resolution of SBP. Will order diagnostic paracentesis with cell count today to ensure SBP improving.   # [place] anemia --hgb has been declining. It was 8.9 on 8/20, down to 7.8 three days ago and 6.7 yesterday. Improved to 8.4 following a unit of blood yesterday.  --No overt GI bleeding. Was for EGD today but in absence of bleeding and improvement in hgb the EGD can be postponed. More important that today she gets follow up diagnostic paracentesis.        SUBJECTIVE:   Feels okay. No abdominal pain, belly just tender.     OBJECTIVE:    Scheduled inpatient medications:  . sodium chloride   Intravenous Once  . senna-docusate  1 tablet Oral QHS   Continuous inpatient infusions:  . cefTRIAXone (ROCEPHIN)  IV 2 g (01/10/20 0854)   PRN inpatient medications: ibuprofen  Vital signs in last 24 hours: Temp:  [97.4 F (36.3 C)-99.5 F (37.5 C)] 97.8 F (36.6 C) (09/10 0628) Pulse Rate:  [81-91] 86 (09/10 0628) Resp:  [16-20] 17 (09/10 0628) BP: (96-112)/(54-63) 107/63 (09/10 0628) SpO2:  [100 %] 100 % (09/10 0628) Last BM Date: 01/06/20  Intake/Output Summary (Last 24 hours) at 01/10/2020 0905 Last data filed at 01/10/2020 0830 Gross per 24 hour  Intake 125.73 ml  Output 1 ml  Net 124.73 ml     Physical Exam:  . General: Alert female in NAD . Heart:  Regular rate and rhythm. No murmur. No lower extremity edema .  Pulmonary: Normal respiratory effort . Abdomen: Soft, nondistended, Nontender. Normal bowel sounds. No masses felt. . Neurologic: Alert and oriented . Psych: Pleasant. Cooperative.   There were no vitals filed for this visit.  Intake/Output from previous day: 09/09 0701 - 09/10 0700 In: 125.7 [P.O.:120; IV Piggyback:5.7] Out: 1 [Stool:1] Intake/Output this shift: No intake/output data recorded.    Lab Results: Recent Labs    01/07/20 1932 01/07/20 1932 01/09/20 0546 01/09/20 1720 01/10/20 0717  WBC 9.3  --  4.4  --  5.1  HGB 7.8*   < > 6.7* 8.5* 8.4*  HCT 23.7*   < > 20.9* 26.1* 25.6*  PLT 75*  --  62*  --  77*   < > = values in this interval not displayed.   BMET Recent Labs    01/07/20 1932 01/09/20 0546 01/10/20 0717  NA 128* 131* 131*  K 5.1 4.6 4.5  CL 104 110 108  CO2 15* 13* 15*  GLUCOSE 219* 144* 130*  BUN 38* 41* 40*  CREATININE 1.72* 1.54* 1.37*  CALCIUM 8.8* 8.2* 8.4*   LFT Recent Labs    01/10/20 0717  PROT 4.9*  ALBUMIN 2.9*  AST 24  ALT 14  ALKPHOS 79  BILITOT 2.5*   PT/INR Recent Labs    01/10/20 0434 01/10/20 0717  LABPROT 18.3* 16.5*  INR 1.6* 1.4*   Hepatitis Panel No results for input(s): HEPBSAG, HCVAB, HEPAIGM, HEPBIGM  in the last 72 hours.  No results found.    Active Problems:   SBP (spontaneous bacterial peritonitis) ([place])     LOS: 2 days   Tye Savoy ,NP 01/10/2020, 9:05 AM

## 2020-01-10 NOTE — Progress Notes (Signed)
Call received from [facility] patient heart rate down to 38 non sustaining. Upon assessment heart rate 79. Vital signs taken. Will continue to monitor

## 2020-01-11 DIAGNOSIS — R519 Headache, unspecified: Secondary | ICD-10-CM

## 2020-01-11 LAB — CBC
HCT: 23 % — ABNORMAL LOW (ref 36.0–46.0)
Hemoglobin: 7.5 g/dL — ABNORMAL LOW (ref 12.0–15.0)
MCH: 30.2 pg (ref 26.0–34.0)
MCHC: 32.6 g/dL (ref 30.0–36.0)
MCV: 92.7 fL (ref 80.0–100.0)
Platelets: 76 10*3/uL — ABNORMAL LOW (ref 150–400)
RBC: 2.48 MIL/uL — ABNORMAL LOW (ref 3.87–5.11)
RDW: 15.3 % (ref 11.5–15.5)
WBC: 3.9 10*3/uL — ABNORMAL LOW (ref 4.0–10.5)
nRBC: 0 % (ref 0.0–0.2)

## 2020-01-11 LAB — CULTURE, BODY FLUID W GRAM STAIN -BOTTLE

## 2020-01-11 LAB — COMPREHENSIVE METABOLIC PANEL
ALT: 17 U/L (ref 0–44)
AST: 32 U/L (ref 15–41)
Albumin: 3.8 g/dL (ref 3.5–5.0)
Alkaline Phosphatase: 80 U/L (ref 38–126)
Anion gap: 9 (ref 5–15)
BUN: 39 mg/dL — ABNORMAL HIGH (ref 6–20)
CO2: 15 mmol/L — ABNORMAL LOW (ref 22–32)
Calcium: 8.7 mg/dL — ABNORMAL LOW (ref 8.9–10.3)
Chloride: 109 mmol/L (ref 98–111)
Creatinine, Ser: 1.38 mg/dL — ABNORMAL HIGH (ref 0.44–1.00)
GFR calc Af Amer: 49 mL/min — ABNORMAL LOW (ref >60)
GFR calc non Af Amer: 42 mL/min — ABNORMAL LOW (ref >60)
Glucose, Bld: 95 mg/dL (ref 70–99)
Potassium: 4.8 mmol/L (ref 3.5–5.1)
Sodium: 133 mmol/L — ABNORMAL LOW (ref 135–145)
Total Bilirubin: 2.4 mg/dL — ABNORMAL HIGH (ref 0.3–1.2)
Total Protein: 5.2 g/dL — ABNORMAL LOW (ref 6.5–8.1)

## 2020-01-11 NOTE — Progress Notes (Addendum)
Subjective:   Patient was seen and evaluated at bedside on morning rounds. She had diagnostic paracentesis yesterday. Reports some pain at the side of procedure. No diarrhea. She also had some headache yesterday and after the procedure, She woke up with HA this morning. Denies N/V. She had similar headache at home that responded to low dose Tylenol. She is aware that she should not take Tylenol frequently given cirrhosis.  Objective:  Vital signs in last 24 hours: Vitals:   01/10/20 1349 01/10/20 1959 01/10/20 2310 01/11/20 0412  BP: 102/65 (!) 97/57 98/72 122/67  Pulse: 70 81 87 92  Resp: 18 17  18   Temp: 97.8 F (36.6 C) 98 F (36.7 C)  98.1 F (36.7 C)  TempSrc: Oral     SpO2: 100% 100% 100% 100%   General: No acute distress Respiratory: CTA bilaterally without wheezing or rhonchi. Cardiovascular: RRR, no murmur. Trace bilateral LEE. Abdominal: There is significant abdominal distention w fluid wave. Paracentesis site is w/p evidence of infection or leakage. Mild abdominal tenderness. No guarding or rebound. Hyperactive BS. Skin: Skin appears mildly jaundiced. There are purpura along the forearms without active bleeding of the skin. No rashes or other lesions noted.  Assessment/Plan:  Active Problems:   SBP (spontaneous bacterial peritonitis) ([place])  #SBP # Decompensated Cirrhosis w refractory ascitics: 2/2 NAFLD vs. Cryptogenic #Large esophageal varices s/p banding. #Thrombocytopenia. #Requires weekly paracenthesis  Patient was admitted 01/07/20 at the request of her GI physician (Dr. Enis Gash) for SBP. Paracentesis fluid 01/07/20: gram negative aerobic rods. PMN total count 3,306 k with 75% neutrophils.  GI is on board. Repeated diagnostic (1li)  para to assess improvement 01/10/2020 that showed PMN total 142 with 70% neutrophil.  Patient was scheduled to get [place] shunt 01/14/20, but she now may be a candidate for TIPS procedure given improvement in MELD score.   Thrombocytopenia stable at 77., with no active signs of bleeding. Albumin 2.9. PT/INR elevated but stable. Hyperbilirubinemia with TB 2.5.  -On Rocephin. (9/8>>). Continue at least for 7 days -Received Alb 9/8 and 9/10. Alb now is 3.8 -f/u gram stain and culture of second peritoneal fluid  - repeat paracentesis ~01/12/20 or 9/22 - daily CMP and CBC  - No EGD per GI given no active bleeding -appreciate GI rec -May consider TIPS procedure next week if improves -Off of diuretics given intolerance  # Normocytic Anemia Required 1 unit of RBC transfusion 9/9 for Hb 6/7. Baseline Hb~9  Hb 7.3<8.4 No active bleeding -Hold off of EGD given no active bleeding per GI -CBC daily - Will hold on iron replacement therapy given active infection; consider therapy vs. repeat saturation ratios once SBP resolves -Avoid NSAD  CBC Latest Ref Rng & Units 01/11/2020 01/10/2020 01/09/2020  WBC 4.0 - 10.5 K/uL 3.9(L) 5.1 -  Hemoglobin 12.0 - 15.0 g/dL 7.5(L) 8.4(L) 8.5(L)  Hematocrit 36 - 46 % 23.0(L) 25.6(L) 26.1(L)  Platelets 150 - 400 K/uL 76(L) 77(L) -   # Headache: No N/v. Has Hx of chronic HA. -Can take low dose Tylenol once or twice a day if needed.  # AKI, Resolved # Asymptomatic Bacteriuria # Non-anion gap Metabolic Acidosis Creatinine initially 1.72 with baseline ~ 1.37.  BUN 41, GFR 37. Bicarb 15 --> 13. Suspect more recent CKD stage IIIa, although Creatinine has returned to baseline.  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   - Continue to monitor daily CMP  #  Hyperkalemia: resolved  Barriers to Discharge: Cirrhosis with recurrent ascites and SBP  Dewayne Hatch, MD 01/11/2020, 6:38 AM Pager: [phone] After 5pm on weekdays and 1pm on weekends: On Call pager [phone]

## 2020-01-11 NOTE — Progress Notes (Signed)
IR follow up. Cell count from para much improved. Cx negative but pending Sodium, Cr,and T bIili all stable  Plan for repeat dx para on Monday to recheck cell count. If labs/MELD remain stable and SBP resolved, tentative plans for re-attempt at TIPS on Tues 9/14  Ascencion Dike PA-C Interventional Radiology 01/11/2020 2:43 PM

## 2020-01-12 LAB — CBC
HCT: 23.6 % — ABNORMAL LOW (ref 36.0–46.0)
Hemoglobin: 7.9 g/dL — ABNORMAL LOW (ref 12.0–15.0)
MCH: 30.7 pg (ref 26.0–34.0)
MCHC: 33.5 g/dL (ref 30.0–36.0)
MCV: 91.8 fL (ref 80.0–100.0)
Platelets: 83 10*3/uL — ABNORMAL LOW (ref 150–400)
RBC: 2.57 MIL/uL — ABNORMAL LOW (ref 3.87–5.11)
RDW: 15.1 % (ref 11.5–15.5)
WBC: 4.7 10*3/uL (ref 4.0–10.5)
nRBC: 0 % (ref 0.0–0.2)

## 2020-01-12 LAB — COMPREHENSIVE METABOLIC PANEL
ALT: 23 U/L (ref 0–44)
AST: 47 U/L — ABNORMAL HIGH (ref 15–41)
Albumin: 3.2 g/dL — ABNORMAL LOW (ref 3.5–5.0)
Alkaline Phosphatase: 105 U/L (ref 38–126)
Anion gap: 9 (ref 5–15)
BUN: 38 mg/dL — ABNORMAL HIGH (ref 6–20)
CO2: 13 mmol/L — ABNORMAL LOW (ref 22–32)
Calcium: 8.5 mg/dL — ABNORMAL LOW (ref 8.9–10.3)
Chloride: 109 mmol/L (ref 98–111)
Creatinine, Ser: 1.6 mg/dL — ABNORMAL HIGH (ref 0.44–1.00)
GFR calc Af Amer: 41 mL/min — ABNORMAL LOW (ref >60)
GFR calc non Af Amer: 35 mL/min — ABNORMAL LOW (ref >60)
Glucose, Bld: 135 mg/dL — ABNORMAL HIGH (ref 70–99)
Potassium: 4.5 mmol/L (ref 3.5–5.1)
Sodium: 131 mmol/L — ABNORMAL LOW (ref 135–145)
Total Bilirubin: 1.9 mg/dL — ABNORMAL HIGH (ref 0.3–1.2)
Total Protein: 4.9 g/dL — ABNORMAL LOW (ref 6.5–8.1)

## 2020-01-12 LAB — CULTURE, BLOOD (ROUTINE X 2)
Culture: NO GROWTH
Culture: NO GROWTH
Special Requests: ADEQUATE

## 2020-01-12 NOTE — Progress Notes (Signed)
[facility] Subjective: [name] is a 59 year old white female who was recently diagnosed with Meagan Peck cirrhosis in March of this year and has been admitted to the hospital with worsening ascites complicated by SBP and is required repeat paracentesis for her refractory ascites.  She is awaiting a TIPS and the plans are to make sure her SBP is completely treated before the TIPS is done so that she does not have any from the decompensation of her procedure.  She has had some worsening abdominal discomfort and distention today and therefore a repeat paracentesis planned for tomorrow with a cell count to determine the also for SBP.  Denies having any fever chills or rigors. She had a bowel movement last night and one today without any melena or hematochezia.  Objective: Vital signs in last 24 hours: Temp:  [98 F (36.7 C)-98.2 F (36.8 C)] 98.2 F (36.8 C) (09/12 1331) Pulse Rate:  [85-88] 85 (09/12 1331) Resp:  [17-18] 17 (09/12 1331) BP: (90-109)/(52-65) 90/52 (09/12 1331) SpO2:  [100 %] 100 % (09/12 1331) Last BM Date: 01/10/20  Intake/Output from previous day: 09/11 0701 - 09/12 0700 In: 600 [P.O.:600] Out: 1050 [Urine:1050] Intake/Output this shift: Total I/O In: 120 [P.O.:120] Out: 200 [Urine:200]  General appearance: cooperative, appears stated age, fatigued, icteric and mild distress Resp: clear to auscultation bilaterally Cardio: regular rate and rhythm, S1, S2 normal, no murmur, click, rub or gallop GI: soft, distended with reducible umbilical hernia mild diffuse tenderness; bowel sounds normal; no masses,  no organomegaly Extremities: extremities are cachectic, no cyanosis  Lab Results: Recent Labs    01/10/20 0717 01/11/20 0353 01/12/20 0128  WBC 5.1 3.9* 4.7  HGB 8.4* 7.5* 7.9*  HCT 25.6* 23.0* 23.6*  PLT 77* 76* 83*   BMET Recent Labs    01/10/20 0717 01/11/20 0353 01/12/20 0128  NA 131* 133* 131*  K 4.5 4.8 4.5  CL 108 109 109  CO2 15* 15*  13*  GLUCOSE 130* 95 135*  BUN 40* 39* 38*  CREATININE 1.37* 1.38* 1.60*  CALCIUM 8.4* 8.7* 8.5*   LFT Recent Labs    01/12/20 0128  PROT 4.9*  ALBUMIN 3.2*  AST 47*  ALT 23  ALKPHOS 105  BILITOT 1.9*   PT/INR Recent Labs    01/10/20 0434 01/10/20 0717  LABPROT 18.3* 16.5*  INR 1.6* 1.4*   Studies/Results: IR Paracentesis  Result Date: 01/10/2020 INDICATION: History of cirrhosis. Refractory ascites. Request diagnostic and therapeutic paracentesis up to 1 L max. EXAM: ULTRASOUND GUIDED RIGHT LOWER QUADRANT PARACENTESIS MEDICATIONS: None. COMPLICATIONS: None immediate. PROCEDURE: Informed written consent was obtained from the patient after a discussion of the risks, benefits and alternatives to treatment. A timeout was performed prior to the initiation of the procedure. Initial ultrasound scanning demonstrates a large amount of ascites within the right lower abdominal quadrant. The right lower abdomen was prepped and draped in the usual sterile fashion. 1% lidocaine was used for local anesthesia. Following this, a 19 gauge, 7-cm, Yueh catheter was introduced. An ultrasound image was saved for documentation purposes. The paracentesis was performed. The catheter was removed and a dressing was applied. The patient tolerated the procedure well without immediate post procedural complication. FINDINGS: A total of approximately 1 L of clear, amber colored fluid was removed. Samples were sent to the laboratory as requested by the clinical team. IMPRESSION: Successful ultrasound-guided paracentesis yielding 1 liters of peritoneal fluid. Read by: Ascencion Dike PA-C Electronically Signed   By: Jacqulynn Cadet  M.D.   On: 01/10/2020 15:58   Medications: I have reviewed the patient's current medications.  Assessment/Plan: 1) [place] cirrhosis with refractory large-volume ascites complicated by SBP.  She is scheduled for repeat paracentesis tomorrow along with a cell count to see if her SBP has  improved as the plans are to treat this infection before TIPS can be placed.  2) Anemia of chronic disease. 3) Reducible umbilical hernia associated with Meagan Peck cirrhosis and ascites 4) Thrombocytopenia associated with Meagan Peck cirrhosis. 5) AODM.  Meagan Peck 01/12/2020, 2:35 PM

## 2020-01-12 NOTE — Progress Notes (Signed)
Subjective:   Ms. Righter states that her abdominal distention has significantly worsened since yesterday and states that she feels close to the point that she has needed therapeutic paracentesis in the past. She endorses mild SOB secondary to her swelling. She continues to have "jabbing" RUQ pain but states this has improved slightly from 2 days ago, and remains unchanged after 1 normal bowel movement for her last night. She denies any blood in her stools. She denies CP, fevers, chills, light-headedness, dizziness, or swelling.  Objective:  Vital signs in last 24 hours: Vitals:   01/11/20 1847 01/11/20 2045 01/12/20 0510 01/12/20 1331  BP: 102/65 (!) 93/52 100/63 (!) 90/52  Pulse: 86 86 87 85  Resp: 17 18 18 17   Temp: 98.2 F (36.8 C) 98.1 F (36.7 C) 98 F (36.7 C) 98.2 F (36.8 C)  TempSrc: Oral  Oral Oral  SpO2: 100%  100% 100%   General: Patient appears chronically ill. No acute distress.  Respiratory: Lung sounds decreased bilaterally but otherwise CTA without wheezing or rhonchi. Cardiovascular: Regular rate and rhythm. No murmurs, rubs, or gallops. Bilateral LE pitting edema is 1+, improved since yesterday.  Abdominal: There is significant abdominal distention, increased from previous exam. There is mild upper abdominal tenderness and moderate tenderness over her protruding but reducible umbilical hernia. Minimal voluntary guarding, no rebound. Significant fluid wave present. Bowel sounds normal. Musculoskeletal: Extremities appear cachectic.  Skin: Skin appears mildly jaundiced. There are purpura along the forearms without active bleeding of the skin. No rashes or other lesions noted. Psych: Normal affect. Normal tone of voice.   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 Enis Gash) for workup and management of abdominal pain, bloating and decreased appetite in the setting of SBP. Patient was first diagnosed with cirrhosis around March of this year and receives weekly paracentesis. Paracentesis fluid from 01/07/20 grew E. Coli. IR note that patient is followed by Dr. Earleen Newport and had been scheduled for [facility] shunt 01/14/20; however, given decrease in PMN count to 142 (70% neutrophils) with improving MELD score on Ceftriaxone, IR now favors TIPS procedure. Thrombocytopenia, hyperalbuminemia, hyperbilirubinemia, and increase in PT/INR stable. stable at 77. No active signs of bleeding. - GI consulted, appreciate their recommendations - IR on board; patient will get repeat diagnostic paracentesis with therapeutic drainage 01/13/20 to monitor for improvement on Rocephin.  - If clinically improved, will consider TIPS procedure - If decision is made to pursue TIPS procedure, patient will not require separate EGD - Continue to monitor daily CMP and CBC  - Will consider attempting reverse Trendelenburg positioning   # Stable Normocytic Anemia Hemoglobin 6.7 on 9/9. Patient given 1 U PRBC with improvement in Hgb to 8.5; Hgb remains stable. Baseline ~ 9. Iron panel shows low iron 27, normal ferritin 121, and saturation ratios unable to be calculated. Does have large volume ascites but no evidence of active bleeding. - Continue to monitor daily CBC's - Will hold on iron replacement therapy given active infection; consider therapy vs. repeat saturation ratios once SBP resolves  # AKI, Resolved # Asymptomatic Bacteriuria # Non-anion gap Metabolic Acidosis Creatinine initially 1.72 with baseline ~ 1.37. BUN 41, GFR 37. Bicarb remains low. Suspect more recent CKD stage IIIa; slight increase in creatinine since yesterday.  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 U/A hazy with large leukocytes and rare bacteria, >50 WBC, but no urinary symptoms or CVA tenderness concerning for treatable infection. U/A showed moderate hemoglobinuria but no RBC's. CK WNL. - Continue to monitor daily CMP  # Diet-Controlled Type II DM Patient had recently been taken off Metformin due to low Hgb A1c of 5.2. BG slightly elevated today.  - Will continue to monitor and initiate SSI if BG remain elevated   # Hypotension [name] blood pressure lower this afternoon. S/p albumin 100g 9/8 and 9/10. Albumin 3.2 today. - Will attempt to avoid fluids as much as possible given cirrhosis - Continue to monitor closely; will likely improve s/p paracentesis tomorrow  # Constipation, Resolved  Patient endorses another normal BM without blood last night. - Continue [place] PRN  # Hyperkalemia, Resolved - K+ 5.1 --> 4.5, continue to monitor   Barriers to Discharge: Cirrhosis with recurrent ascites and SBP  Jeralyn Bennett, MD 01/12/2020, 4:18 PM Pager: [phone] After 5pm on weekdays and 1pm on weekends: On Call pager [phone]

## 2020-01-13 ENCOUNTER — Inpatient Hospital Stay (HOSPITAL_COMMUNITY): Payer: No Typology Code available for payment source

## 2020-01-13 DIAGNOSIS — K7011 Alcoholic hepatitis with ascites: Secondary | ICD-10-CM

## 2020-01-13 DIAGNOSIS — K652 Spontaneous bacterial peritonitis: Principal | ICD-10-CM

## 2020-01-13 HISTORY — PX: IR PARACENTESIS: IMG2679

## 2020-01-13 LAB — CBC
HCT: 23.5 % — ABNORMAL LOW (ref 36.0–46.0)
Hemoglobin: 7.7 g/dL — ABNORMAL LOW (ref 12.0–15.0)
MCH: 30.3 pg (ref 26.0–34.0)
MCHC: 32.8 g/dL (ref 30.0–36.0)
MCV: 92.5 fL (ref 80.0–100.0)
Platelets: 87 10*3/uL — ABNORMAL LOW (ref 150–400)
RBC: 2.54 MIL/uL — ABNORMAL LOW (ref 3.87–5.11)
RDW: 14.9 % (ref 11.5–15.5)
WBC: 5 10*3/uL (ref 4.0–10.5)
nRBC: 0 % (ref 0.0–0.2)

## 2020-01-13 LAB — COMPREHENSIVE METABOLIC PANEL
ALT: 23 U/L (ref 0–44)
AST: 43 U/L — ABNORMAL HIGH (ref 15–41)
Albumin: 3 g/dL — ABNORMAL LOW (ref 3.5–5.0)
Alkaline Phosphatase: 110 U/L (ref 38–126)
Anion gap: 8 (ref 5–15)
BUN: 35 mg/dL — ABNORMAL HIGH (ref 6–20)
CO2: 15 mmol/L — ABNORMAL LOW (ref 22–32)
Calcium: 8.4 mg/dL — ABNORMAL LOW (ref 8.9–10.3)
Chloride: 109 mmol/L (ref 98–111)
Creatinine, Ser: 1.4 mg/dL — ABNORMAL HIGH (ref 0.44–1.00)
GFR calc Af Amer: 48 mL/min — ABNORMAL LOW (ref >60)
GFR calc non Af Amer: 42 mL/min — ABNORMAL LOW (ref >60)
Glucose, Bld: 117 mg/dL — ABNORMAL HIGH (ref 70–99)
Potassium: 4.5 mmol/L (ref 3.5–5.1)
Sodium: 132 mmol/L — ABNORMAL LOW (ref 135–145)
Total Bilirubin: 1.7 mg/dL — ABNORMAL HIGH (ref 0.3–1.2)
Total Protein: 5 g/dL — ABNORMAL LOW (ref 6.5–8.1)

## 2020-01-13 LAB — BODY FLUID CELL COUNT WITH DIFFERENTIAL
Eos, Fluid: 4 %
Lymphs, Fluid: 29 %
Monocyte-Macrophage-Serous Fluid: 54 % (ref 50–90)
Neutrophil Count, Fluid: 13 % (ref 0–25)
Other Cells, Fluid: 1 %
Total Nucleated Cell Count, Fluid: 68 cu mm (ref 0–1000)

## 2020-01-13 LAB — GRAM STAIN

## 2020-01-13 LAB — PATHOLOGIST SMEAR REVIEW

## 2020-01-13 MED ORDER — VANCOMYCIN HCL IN DEXTROSE 1-5 GM/200ML-% IV SOLN: 1000.0000 mg | INTRAVENOUS | Status: DC

## 2020-01-13 MED ORDER — LIDOCAINE HCL 1 % IJ SOLN
INTRAMUSCULAR | Status: AC
  Filled 2020-01-13: qty 20

## 2020-01-13 MED ORDER — CEFAZOLIN SODIUM-DEXTROSE 2-4 GM/100ML-% IV SOLN
2.0000 g | Freq: Three times a day (TID) | INTRAVENOUS | Status: AC
  Administered 2020-01-14 – 2020-01-21 (×22): 2 g via INTRAVENOUS
  Filled 2020-01-13 (×28): qty 100

## 2020-01-13 MED ORDER — LIDOCAINE HCL 1 % IJ SOLN
INTRAMUSCULAR | Status: DC | PRN
  Administered 2020-01-13: 10 mL

## 2020-01-13 NOTE — Consult Note (Signed)
Chief Complaint: Patient was seen in consultation today for transjugular intrahepatic portal system shunt placement/possible variceal embolization Chief Complaint  Patient presents with  . Abdominal Pain   at the request of Dr Doyne Keel   Supervising Physician: Corrie Mckusick  Patient Status: [facility] - In-pt  History of Present Illness: Meagan Peck is a 59 y.o. female   Known to IR service NASH Cirrhosis Refractory ascites  Attempted TIPS 2-3 other times Has been rescheduled and cancelled secondary high MELD and SBP- spontaneous bacterial peritonitis  MELD now: 19  Abdominal distension is severe today Last paracentesis was just 9/10 with 1 L max per SBP Cell Ct 142 9/10  BP stable;  Afeb Wbc wnl  Scheduled for paracentesis again today Scheduled for TIPS for tomorrow  We have spoken to Dr Havery Moros  Plan is to proceed with TIPS procedure in am- if all well with cell count today   Past Medical History:  Diagnosis Date  . Acute medial meniscus tear of right knee 12/10/2015  . Allergy   . Arthritis    bil feet  . Ascites   . Cataract    bilateral - surgery to remove  . Cirrhosis ([place])   . Cirrhosis ([place])   . Diabetes mellitus without complication ([place])    type 2 - last a1c was 5.2 per patient, no meds  . Dyspnea    when I have too much fluid  . Esophageal varices ([place])   . GERD (gastroesophageal reflux disease)   . Heart murmur   . Hepatic cirrhosis ([place])   . HLD (hyperlipidemia)    no meds  . Hypertension    no meds  . Iron deficiency anemia 09/26/2018    Past Surgical History:  Procedure Laterality Date  . BIOPSY  08/20/2019   Procedure: BIOPSY;  Surgeon: Jackquline Denmark, MD;  Location: [place];  Service: Gastroenterology;;  . CARPAL TUNNEL RELEASE Right   . CESAREAN SECTION    . CHOLECYSTECTOMY    . COLONOSCOPY  08/2019  . ESOPHAGEAL BANDING N/A 09/19/2019   Procedure: ESOPHAGEAL BANDING;  Surgeon: Yetta Flock, MD;  Location:  [facility];  Service: Gastroenterology;  Laterality: N/A;  . ESOPHAGOGASTRODUODENOSCOPY (EGD) WITH PROPOFOL N/A 08/20/2019   Procedure: ESOPHAGOGASTRODUODENOSCOPY (EGD) WITH PROPOFOL;  Surgeon: Jackquline Denmark, MD;  Location: [place];  Service: Gastroenterology;  Laterality: N/A;  . ESOPHAGOGASTRODUODENOSCOPY (EGD) WITH PROPOFOL N/A 09/19/2019   Procedure: ESOPHAGOGASTRODUODENOSCOPY (EGD) WITH PROPOFOL;  Surgeon: Yetta Flock, MD;  Location: [facility];  Service: Gastroenterology;  Laterality: N/A;  . EYE SURGERY Bilateral    removed cataracts  . GASTRIC VARICES BANDING N/A 08/20/2019   Procedure: GASTRIC VARICES BANDING;  Surgeon: Jackquline Denmark, MD;  Location: [place];  Service: Gastroenterology;  Laterality: N/A;  . IR PARACENTESIS  08/01/2019  . IR PARACENTESIS  08/19/2019  . IR PARACENTESIS  09/09/2019  . IR PARACENTESIS  09/23/2019  . IR PARACENTESIS  10/08/2019  . IR PARACENTESIS  10/23/2019  . IR PARACENTESIS  11/12/2019  . IR PARACENTESIS  11/21/2019  . IR PARACENTESIS  11/25/2019  . IR PARACENTESIS  12/05/2019  . IR PARACENTESIS  12/13/2019  . IR PARACENTESIS  12/20/2019  . IR PARACENTESIS  12/26/2019  . IR PARACENTESIS  12/31/2019  . IR PARACENTESIS  01/07/2020  . IR PARACENTESIS  01/10/2020  . IR RADIOLOGIST EVAL & MGMT  10/30/2019  . IR RADIOLOGIST EVAL & MGMT  12/11/2019  . IR RADIOLOGIST EVAL & MGMT  12/24/2019  . IR  TIPS  11/25/2019  . KNEE ARTHROSCOPY Right 12/10/2015   Procedure: RIGHT KNEE ARTHROSCOPY WITH PARTIAL MEDIAL MENISCECTOMY;  Surgeon: Mcarthur Rossetti, MD;  Location: [facility];  Service: Orthopedics;  Laterality: Right;  . KNEE ARTHROSCOPY W/ MENISCAL REPAIR Bilateral   . MENISCUS REPAIR Left   . RADIOLOGY WITH ANESTHESIA N/A 11/25/2019   Procedure: TIPS;  Surgeon: Corrie Mckusick, DO;  Location: [place];  Service: Anesthesiology;  Laterality: N/A;    Allergies: Penicillins, Atrovent [ipratropium], Naproxen, Quinine derivatives, and Sulfa  antibiotics  Medications: Prior to Admission medications   Medication Sig Start Date End Date Taking? Authorizing Provider  cetirizine (ZYRTEC) 10 MG tablet Take 1 tablet (10 mg total) by mouth daily. Patient taking differently: Take 10 mg by mouth daily as needed for allergies.  01/02/19  Yes [name], DO  ACCU-CHEK FASTCLIX LANCETS MISC Check fsbs TID 08/09/16   [provider]  aspirin 81 MG tablet Take 1 tablet (81 mg total) by mouth daily. Patient taking differently: Take 81 mg by mouth at bedtime.  08/30/19   Cherylann Ratel A, DO  Cholecalciferol (VITAMIN D3) 5000 UNITS TABS Take 5,000 Units by mouth daily.     [provider]  ferrous sulfate 325 (65 FE) MG EC tablet Take 1 tablet (325 mg total) by mouth 2 (two) times daily with a meal. 09/26/18   [name], DO  Krill Oil 500 MG CAPS Take 500 mg by mouth daily.    [provider]  magnesium oxide (MAG-OX) 400 MG tablet Take 1 tablet (400 mg total) by mouth 2 (two) times daily. 01/02/19   [name], DO  mupirocin ointment (BACTROBAN) 2 % Apply 1 application topically 3 (three) times daily. Patient not taking: Reported on 01/08/2020 10/22/19   Kandra Nicolas, MD     Family History  Problem Relation Age of Onset  . Hypertension Mother   . COPD Mother   . Alcoholism Father   . Heart attack Father   . Diabetes Brother   . Diabetes Paternal Grandmother   . Heart disease Paternal Grandmother   . Alcoholism Paternal Uncle   . Alcoholism Maternal Uncle   . Heart disease Maternal Grandmother   . Heart disease Maternal Grandfather   . Heart disease Paternal Grandfather     Social History   Socioeconomic History  . Marital status: Married    Spouse name: Jahyra Sukup  . Number of children: 1  . Years of education: Not on file  . Highest education level: Associate degree: academic program  Occupational History  . Not on file  Tobacco Use  . Smoking status: Never Smoker  .  Smokeless tobacco: Never Used  Vaping Use  . Vaping Use: Never used  Substance and Sexual Activity  . Alcohol use: No  . Drug use: No  . Sexual activity: Yes    Partners: Male    Birth control/protection: None, Post-menopausal  Other Topics Concern  . Not on file  Social History Narrative  . Not on file   Social Determinants of Health   Financial Resource Strain:   . Difficulty of Paying Living Expenses: Not on file  Food Insecurity:   . Worried About Charity fundraiser in the Last Year: Not on file  . Ran Out of Food in the Last Year: Not on file  Transportation Needs:   . Lack of Transportation (Medical): Not on file  . Lack of Transportation (Non-Medical): Not on file  Physical Activity:   .  Days of Exercise per Week: Not on file  . Minutes of Exercise per Session: Not on file  Stress:   . Feeling of Stress : Not on file  Social Connections:   . Frequency of Communication with Friends and Family: Not on file  . Frequency of Social Gatherings with Friends and Family: Not on file  . Attends Religious Services: Not on file  . Active Member of Clubs or Organizations: Not on file  . Attends Archivist Meetings: Not on file  . Marital Status: Not on file    Review of Systems: A 12 point ROS discussed and pertinent positives are indicated in the HPI above.  All other systems are negative.  Review of Systems  Constitutional: Positive for activity change, appetite change and fatigue. Negative for fever.  Respiratory: Positive for shortness of breath.   Cardiovascular: Negative for chest pain.  Gastrointestinal: Positive for abdominal distention, abdominal pain and nausea.  Musculoskeletal: Positive for gait problem.  Neurological: Positive for weakness.  Psychiatric/Behavioral: Negative for behavioral problems and confusion.    Vital Signs: BP 105/62 (BP Location: Right Arm)   Pulse 90   Temp 98.9 F (37.2 C) (Oral)   Resp 17   LMP 12/03/2013 (LMP Unknown)    SpO2 100%   Physical Exam Vitals reviewed.  Cardiovascular:     Rate and Rhythm: Normal rate and regular rhythm.  Pulmonary:     Breath sounds: Wheezing present.  Abdominal:     General: Bowel sounds are decreased. There is distension.     Palpations: There is fluid wave.     Tenderness: There is abdominal tenderness.  Skin:    General: Skin is warm.  Neurological:     Mental Status: She is alert and oriented to person, place, and time.  Psychiatric:        Behavior: Behavior normal.     Imaging: IR Radiologist Eval & Mgmt  Result Date: 12/24/2019 Please refer to notes tab for details about interventional procedure. (Op Note)  IR Paracentesis  Result Date: 01/10/2020 INDICATION: History of cirrhosis. Refractory ascites. Request diagnostic and therapeutic paracentesis up to 1 L max. EXAM: ULTRASOUND GUIDED RIGHT LOWER QUADRANT PARACENTESIS MEDICATIONS: None. COMPLICATIONS: None immediate. PROCEDURE: Informed written consent was obtained from the patient after a discussion of the risks, benefits and alternatives to treatment. A timeout was performed prior to the initiation of the procedure. Initial ultrasound scanning demonstrates a large amount of ascites within the right lower abdominal quadrant. The right lower abdomen was prepped and draped in the usual sterile fashion. 1% lidocaine was used for local anesthesia. Following this, a 19 gauge, 7-cm, Yueh catheter was introduced. An ultrasound image was saved for documentation purposes. The paracentesis was performed. The catheter was removed and a dressing was applied. The patient tolerated the procedure well without immediate post procedural complication. FINDINGS: A total of approximately 1 L of clear, amber colored fluid was removed. Samples were sent to the laboratory as requested by the clinical team. IMPRESSION: Successful ultrasound-guided paracentesis yielding 1 liters of peritoneal fluid. Read by: Ascencion Dike PA-C  Electronically Signed   By: Jacqulynn Cadet M.D.   On: 01/10/2020 15:58   IR Paracentesis  Result Date: 01/07/2020 INDICATION: Patient with history of cirrhosis, recurrent ascites. Request is made for diagnostic and therapeutic paracentesis. EXAM: ULTRASOUND GUIDED DIAGNOSTIC AND THERAPEUTIC PARACENTESIS MEDICATIONS: 10 mL 1% lidocaine COMPLICATIONS: None immediate. PROCEDURE: Informed written consent was obtained from the patient after a discussion of the risks, benefits  and alternatives to treatment. A timeout was performed prior to the initiation of the procedure. Initial ultrasound scanning demonstrates a large amount of ascites within the left lateral abdomen. The left lateral abdomen was prepped and draped in the usual sterile fashion. 1% lidocaine was used for local anesthesia. Following this, a 19 gauge, 7-cm, Yueh catheter was introduced. An ultrasound image was saved for documentation purposes. The paracentesis was performed. The catheter was removed and a dressing was applied. The patient tolerated the procedure well without immediate post procedural complication. Patient received post-procedure intravenous albumin; see nursing notes for details. FINDINGS: A total of approximately 7.0 liters of yellow fluid was removed. Samples were sent to the laboratory as requested by the clinical team. IMPRESSION: Successful ultrasound-guided paracentesis yielding 7.0 liters of peritoneal fluid. Read by: Brynda Greathouse PA-C Electronically Signed   By: Corrie Mckusick D.O.   On: 01/07/2020 11:04   IR Paracentesis  Result Date: 12/31/2019 INDICATION: Patient with a history of cirrhosis and recurrent large volume ascites. Interventional radiology asked to perform a therapeutic and diagnostic paracentesis. EXAM: ULTRASOUND GUIDED PARACENTESIS MEDICATIONS: 1% lidocaine 10 mL COMPLICATIONS: None immediate PROCEDURE: Informed written consent was obtained from the patient after a discussion of the risks, benefits and  alternatives to treatment. A timeout was performed prior to the initiation of the procedure. Initial ultrasound scanning demonstrates a large amount of ascites within the right lower abdominal quadrant. The right lower abdomen was prepped and draped in the usual sterile fashion. 1% lidocaine was used for local anesthesia. Following this, a 19 gauge, 7-cm, Yueh catheter was introduced. An ultrasound image was saved for documentation purposes. The paracentesis was performed. The catheter was removed and a dressing was applied. The patient tolerated the procedure well without immediate post procedural complication. Patient received post-procedure intravenous albumin; see nursing notes for details. FINDINGS: A total of approximately 8.2 L of clear yellow fluid was removed. Samples were sent to the laboratory as requested by the clinical team. IMPRESSION: Successful ultrasound-guided paracentesis yielding 8.2 liters of peritoneal fluid. Read by: Soyla Dryer, NP Electronically Signed   By: Jacqulynn Cadet M.D.   On: 12/31/2019 12:32   IR Paracentesis  Result Date: 12/26/2019 INDICATION: Current ascites secondary to cryptogenic/NASH cirrhosis with upcoming [place] shunt planned. Request for diagnostic and therapeutic paracentesis. EXAM: ULTRASOUND GUIDED PARACENTESIS MEDICATIONS: 1% lidocaine 10 mL COMPLICATIONS: None immediate. PROCEDURE: Informed written consent was obtained from the patient after a discussion of the risks, benefits and alternatives to treatment. A timeout was performed prior to the initiation of the procedure. Initial ultrasound scanning demonstrates a large amount of ascites within the left lateral abdomen. The left lateral abdomen was prepped and draped in the usual sterile fashion. 1% lidocaine was used for local anesthesia. Following this, a 19 gauge, 7-cm, Yueh catheter was introduced. An ultrasound image was saved for documentation purposes. The paracentesis was performed. The catheter  was removed and a dressing was applied. The patient tolerated the procedure well without immediate post procedural complication. Patient received post-procedure intravenous albumin; see nursing notes for details. FINDINGS: A total of approximately 10.4 L of clear yellow fluid was removed. Samples were sent to the laboratory as requested by the clinical team. IMPRESSION: Successful ultrasound-guided paracentesis yielding 10.4 liters of peritoneal fluid. Read by: Gareth Eagle, PA-C Electronically Signed   By: Corrie Mckusick D.O.   On: 12/26/2019 12:26   IR Paracentesis  Result Date: 12/20/2019 INDICATION: Patient with cirrhosis, recurrent ascites. Unable to undergo tips procedure today due  to elevated meld score, proceed with diagnostic and therapeutic paracentesis. EXAM: ULTRASOUND GUIDED DIAGNOSTIC AND THERAPEUTIC PARACENTESIS MEDICATIONS: 10 mL 1% lidocaine COMPLICATIONS: None immediate. PROCEDURE: Informed written consent was obtained from the patient after a discussion of the risks, benefits and alternatives to treatment. A timeout was performed prior to the initiation of the procedure. Initial ultrasound scanning demonstrates a large amount of ascites within the left lateral abdomen. The left lateral abdomen was prepped and draped in the usual sterile fashion. 1% lidocaine was used for local anesthesia. Following this, a 19 gauge, 7-cm, Yueh catheter was introduced. An ultrasound image was saved for documentation purposes. The paracentesis was performed. The catheter was removed and a dressing was applied. The patient tolerated the procedure well without immediate post procedural complication. FINDINGS: A total of approximately 5.0 liters of yellow fluid was removed. Samples were sent to the laboratory as requested by the clinical team. IMPRESSION: Successful ultrasound-guided diagnostic and therapeutic paracentesis yielding 5.0 liters of peritoneal fluid. Read by: Brynda Greathouse PA-C Electronically Signed    By: Corrie Mckusick D.O.   On: 12/20/2019 15:00    Labs:  CBC: Recent Labs    01/10/20 0717 01/11/20 0353 01/12/20 0128 01/13/20 0139  WBC 5.1 3.9* 4.7 5.0  HGB 8.4* 7.5* 7.9* 7.7*  HCT 25.6* 23.0* 23.6* 23.5*  PLT 77* 76* 83* 87*    COAGS: Recent Labs    11/25/19 0630 11/25/19 0630 12/20/19 0624 01/07/20 1932 01/10/20 0434 01/10/20 0717  INR 1.5*   < > 1.4* 1.8* 1.6* 1.4*  APTT 35  --   --   --   --   --    < > = values in this interval not displayed.    BMP: Recent Labs    01/10/20 0717 01/11/20 0353 01/12/20 0128 01/13/20 0139  NA 131* 133* 131* 132*  K 4.5 4.8 4.5 4.5  CL 108 109 109 109  CO2 15* 15* 13* 15*  GLUCOSE 130* 95 135* 117*  BUN 40* 39* 38* 35*  CALCIUM 8.4* 8.7* 8.5* 8.4*  CREATININE 1.37* 1.38* 1.60* 1.40*  GFRNONAA 43* 42* 35* 42*  GFRAA 49* 49* 41* 48*    LIVER FUNCTION TESTS: Recent Labs    01/10/20 0717 01/11/20 0353 01/12/20 0128 01/13/20 0139  BILITOT 2.5* 2.4* 1.9* 1.7*  AST 24 32 47* 43*  ALT 14 17 23 23   ALKPHOS 79 80 105 110  PROT 4.9* 5.2* 4.9* 5.0*  ALBUMIN 2.9* 3.8 3.2* 3.0*    TUMOR MARKERS: Recent Labs    07/31/19 1154  AFPTM 2.3    Assessment and Plan:  For paracentesis in IR today-- check cell ct Scheduled for TIPS tomorrow in IR  Risks and benefits of TIPS, BRTO and/or additional variceal embolization were discussed with the patient and/or the patient's family including, but not limited to, infection, bleeding, damage to adjacent structures, worsening hepatic and/or cardiac function, worsening and/or the development of altered mental status/encephalopathy, non-target embolization and death.   This interventional procedure involves the use of X-rays and because of the nature of the planned procedure, it is possible that we will have prolonged use of X-ray fluoroscopy.  Potential radiation risks to you include (but are not limited to) the following: - A slightly elevated risk for cancer  several years  later in life. This risk is typically less than 0.5% percent. This risk is low in comparison to the normal incidence of human cancer, which is 33% for women and 50% for men according to  the [place]. - Radiation induced injury can include skin redness, resembling a rash, tissue breakdown / ulcers and hair loss (which can be temporary or permanent).   The likelihood of either of these occurring depends on the difficulty of the procedure and whether you are sensitive to radiation due to previous procedures, disease, or genetic conditions.   IF your procedure requires a prolonged use of radiation, you will be notified and given written instructions for further action.  It is your responsibility to monitor the irradiated area for the 2 weeks following the procedure and to notify your physician if you are concerned that you have suffered a radiation induced injury.    All of the patient's questions were answered, patient is agreeable to proceed.  Consent signed and in chart.  Thank you for this interesting consult.  I greatly enjoyed meeting Meagan Peck and look forward to participating in their care.  A copy of this report was sent to the requesting provider on this date.  Electronically Signed: Lavonia Drafts, PA-C 01/13/2020, 1:16 PM   I spent a total of 40 Minutes    in face to face in clinical consultation, greater than 50% of which was counseling/coordinating care for TIPS

## 2020-01-13 NOTE — Procedures (Signed)
PROCEDURE SUMMARY:  Successful US guided paracentesis from right abdomen.  Yielded 4.8 L of cloudy pink fluid.  No immediate complications.  Pt tolerated well.   Specimen sent for labs.  EBL < 61m  Daylynn Stumpp R Mustafa Potts, NP 01/13/2020 5:10 PM

## 2020-01-13 NOTE — Progress Notes (Signed)
Pharmacy Antibiotic Note  Meagan Peck is a 59 y.o. female admitted on 01/07/2020 with Ecoli SBP.  Pharmacy has been consulted for cefazolin dosing.  Last weight 8/20 = 68kg, ClCr ~47 ml/min. Noted PCN allergy as a child and tolerated ceftriaxone. Will monitor.   Plan: Stop Ceftriaxone Start cefazolin 2g Q8 hr tomorrow (ceftriaxone will cover today) Monitor for allergic reaction  F/u end date   Temp (24hrs), Avg:99 F (37.2 C), Min:98.9 F (37.2 C), Max:99.1 F (37.3 C)  Recent Labs  Lab 01/07/20 1932 01/07/20 1932 01/08/20 0948 01/08/20 1819 01/09/20 0546 01/10/20 0717 01/11/20 0353 01/12/20 0128 01/13/20 0139  WBC 9.3   < >  --   --  4.4 5.1 3.9* 4.7 5.0  CREATININE 1.72*   < >  --   --  1.54* 1.37* 1.38* 1.60* 1.40*  LATICACIDVEN 2.3*  --  1.4 1.7  --   --   --   --   --    < > = values in this interval not displayed.    CrCl cannot be calculated (Unknown ideal weight.).    Allergies  Allergen Reactions  . Penicillins Anaphylaxis    Happened as a young child  . Atrovent [Ipratropium] Palpitations    As per patient  . Naproxen Rash  . Quinine Derivatives Rash  . Sulfa Antibiotics Rash    Antimicrobials this admission: Ceftriaxone 9/8 >> 9/13 Cefazolin 9/14 >>    Microbiology results: 9/7 BCx: ngtd 9/7 ascites: Ecoli (Pan-S) 9/10 ascites: ngtd   Thank you for allowing pharmacy to be a part of this patient's care.   Benetta Spar, PharmD, BCPS, BCCP Clinical Pharmacist  Please check AMION for all [facility] phone numbers After 10:00 PM, call [place] [phone]

## 2020-01-13 NOTE — Plan of Care (Signed)
  Problem: Education: Goal: Knowledge of General Education information will improve Description: Including pain rating scale, medication(s)/side effects and non-pharmacologic comfort measures 01/13/2020 1747 by Shanon Ace, RN Outcome: Progressing 01/13/2020 1747 by Shanon Ace, RN Outcome: Progressing   Problem: Health Behavior/Discharge Planning: Goal: Ability to manage health-related needs will improve 01/13/2020 1747 by Shanon Ace, RN Outcome: Progressing 01/13/2020 1747 by Shanon Ace, RN Outcome: Progressing   Problem: Clinical Measurements: Goal: Ability to maintain clinical measurements within normal limits will improve 01/13/2020 1747 by Shanon Ace, RN Outcome: Progressing 01/13/2020 1747 by Shanon Ace, RN Outcome: Progressing Goal: Will remain free from infection 01/13/2020 1747 by Shanon Ace, RN Outcome: Progressing 01/13/2020 1747 by Shanon Ace, RN Outcome: Progressing Goal: Diagnostic test results will improve 01/13/2020 1747 by Shanon Ace, RN Outcome: Progressing 01/13/2020 1747 by Shanon Ace, RN Outcome: Progressing Goal: Respiratory complications will improve 01/13/2020 1747 by Shanon Ace, RN Outcome: Progressing 01/13/2020 1747 by Shanon Ace, RN Outcome: Progressing Goal: Cardiovascular complication will be avoided 01/13/2020 1747 by Shanon Ace, RN Outcome: Progressing 01/13/2020 1747 by Shanon Ace, RN Outcome: Progressing   Problem: Activity: Goal: Risk for activity intolerance will decrease 01/13/2020 1747 by Shanon Ace, RN Outcome: Progressing 01/13/2020 1747 by Shanon Ace, RN Outcome: Progressing   Problem: Nutrition: Goal: Adequate nutrition will be maintained 01/13/2020 1747 by Shanon Ace, RN Outcome: Progressing 01/13/2020 1747 by Shanon Ace, RN Outcome: Progressing   Problem: Coping: Goal: Level of anxiety will decrease 01/13/2020 1747 by Shanon Ace, RN Outcome: Progressing 01/13/2020 1747 by Shanon Ace, RN Outcome: Progressing   Problem: Elimination: Goal: Will not experience complications related to bowel motility 01/13/2020 1747 by Shanon Ace, RN Outcome: Progressing 01/13/2020 1747 by Shanon Ace, RN Outcome: Progressing Goal: Will not experience complications related to urinary retention 01/13/2020 1747 by Shanon Ace, RN Outcome: Progressing 01/13/2020 1747 by Shanon Ace, RN Outcome: Progressing   Problem: Pain Managment: Goal: General experience of comfort will improve 01/13/2020 1747 by Shanon Ace, RN Outcome: Progressing 01/13/2020 1747 by Shanon Ace, RN Outcome: Progressing   Problem: Safety: Goal: Ability to remain free from injury will improve 01/13/2020 1747 by Shanon Ace, RN Outcome: Progressing 01/13/2020 1747 by Shanon Ace, RN Outcome: Progressing   Problem: Skin Integrity: Goal: Risk for impaired skin integrity will decrease 01/13/2020 1747 by Shanon Ace, RN Outcome: Progressing 01/13/2020 1747 by Shanon Ace, RN Outcome: Progressing

## 2020-01-13 NOTE — Plan of Care (Signed)

## 2020-01-13 NOTE — Progress Notes (Signed)
Subjective:   Meagan Peck states that her abdominal distention has continued to increase. She states that her SOB has progressed slightly since yesterday. She denies any light-headedness or dizziness. She states that she has mild abdominal discomfort but no localized abdominal pain. She denies any fevers, chills, CP or LE swelling.   Objective:  Vital signs in last 24 hours: Vitals:   01/12/20 0510 01/12/20 1331 01/12/20 1935 01/13/20 0349  BP: 100/63 (!) 90/52 107/65 105/62  Pulse: 87 85 89 90  Resp: 18 17 17 17   Temp: 98 F (36.7 C) 98.2 F (36.8 C) 99.1 F (37.3 C) 98.9 F (37.2 C)  TempSrc: Oral Oral Oral Oral  SpO2: 100% 100% 100% 100%   General: Patient appears chronically ill. No acute distress.  Respiratory: Lung sounds decreased bilaterally but otherwise CTA without wheezing or rhonchi. Cardiovascular: Regular rate and rhythm. No murmurs, rubs, or gallops. There is trace bilateral lower extremity pitting edema, improved since yesterday.  Abdominal: There is significant abdominal distention, increased from previous exam. There is mild upper abdominal tenderness and moderate tenderness over her protruding but reducible umbilical hernia. Minimal voluntary guarding, no rebound. Significant fluid wave present. Bowel sounds normal. Musculoskeletal: Extremities appear cachectic.  Skin: Skin appears mildly jaundiced. There are purpura along the forearms without active bleeding of the skin. No rashes or other lesions noted. Psych: Normal affect. Normal tone of voice.   Assessment/Plan:  Active Problems:   SBP (spontaneous bacterial peritonitis) ([place])  # Refractory, Decompensated Cirrhosis 2/2 likely NAFLD, Complicated by Active SBP, recurrent large volume ascites, large esophageal varices s/p banding, and thrombocytopenia  Patient was admitted 01/07/20 at the request of her GI physician (Dr. Enis Gash) for workup and management of abdominal pain, bloating and decreased appetite  in the setting of SBP. Patient was first diagnosed with cirrhosis around March of this year and receives weekly paracentesis. Paracentesis fluid from 01/07/20 grew E. Coli. Thrombocytopenia, hyperalbuminemia, hyperbilirubinemia, and increase in PT/INR stable. No active signs of bleeding.  - GI consulted and IR on board, appreciate their recommendations - Patient will receive diagnostic and therapeutic paracentesis today - Will obtain body fluid cell counts, culture, and gram stain  - If SBP resolved, patient will undergo TIPS procedure tomorrow morning - Today was patient's 6th day on Ceftriaxone; consulted pharmacy to dose cefazolin given patient's sensitivity  - Continue to monitor daily CMP and CBC  - Patient may require albumin if hypotensive after procedure  # Stable Normocytic Anemia Hemoglobin 6.7 on 9/9. Patient given 1 U PRBC with improvement in Hgb to 8.5; Hgb remains low but stable. Baseline ~ 9. Iron panel shows low iron 27, normal ferritin 121, and saturation ratios unable to be calculated. Does have large volume ascites but no evidence of active bleeding. - Continue to monitor daily CBC's - Will hold on iron replacement therapy given active infection; consider therapy vs. repeat saturation ratios once SBP resolves  # AKI, Resolved # Asymptomatic Bacteriuria # Non-anion gap Metabolic Acidosis Creatinine initially 1.72 with baseline ~ 1.37. BUN 41, GFR 37. Bicarb remains low. Suspect more recent CKD stage IIIa; Creatinine stable. 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 U/A hazy with large leukocytes and rare bacteria, >50 WBC, but no urinary symptoms or CVA tenderness concerning for treatable infection. U/A showed moderate hemoglobinuria but no RBC's. CK WNL. - Continue to monitor daily CMP  # Diet-Controlled Type II DM Patient had recently  been taken off Metformin due to low Hgb A1c of 5.2. BG  slightly elevated today.  - Will continue to monitor and initiate SSI if BG remain elevated   # Hypotension [name] blood pressure stable. S/p albumin 100g 9/8 and 9/10. Albumin 3.2 today.  - Will attempt to avoid fluids as much as possible given cirrhosis - Continue to monitor closely; will likely improve s/p paracentesis  # Constipation, Resolved  - Continue Senakot PRN  # Hyperkalemia, Resolved - K+ 5.1 --> 4.5, continue to monitor   Barriers to Discharge: Cirrhosis with recurrent ascites and SBP  Jeralyn Bennett, MD 01/13/2020, 7:02 AM Pager: [phone] After 5pm on weekdays and 1pm on weekends: On Call pager [phone]

## 2020-01-13 NOTE — Progress Notes (Signed)
          Daily Rounding Note  01/13/2020, 8:19 AM  LOS: 5 days   SUBJECTIVE:   Chief complaint:   Large volume ascites.  SBP.  Pt feels well but ascites re-accumulating and uncomfortable and makes it hard to take a deep breath.  OBJECTIVE:         Vital signs in last 24 hours:    Temp:  [98.2 F (36.8 C)-99.1 F (37.3 C)] 98.9 F (37.2 C) (09/13 0349) Pulse Rate:  [85-90] 90 (09/13 0349) Resp:  [17] 17 (09/13 0349) BP: (90-107)/(52-65) 105/62 (09/13 0349) SpO2:  [100 %] 100 % (09/13 0349) Last BM Date: 01/12/20 There were no vitals filed for this visit.   General: looks chronically ill, comfortable   Heart: RRR Chest: clear but BS absent in LLL Abdomen: protuberant, moderately tense.  Not tender.  BS present but muffled  Extremities: no CCE Neuro/Psych:  Alert, appropriate, oriented x 3.  No asterixis, no speech delay.  Good historian.  Fluid speech  Intake/Output from previous day: 09/12 0701 - 09/13 0700 In: 120 [P.O.:120] Out: 850 [Urine:850]  Intake/Output this shift: No intake/output data recorded.  Lab Results: Recent Labs    01/11/20 0353 01/12/20 0128 01/13/20 0139  WBC 3.9* 4.7 5.0  HGB 7.5* 7.9* 7.7*  HCT 23.0* 23.6* 23.5*  PLT 76* 83* 87*   BMET Recent Labs    01/11/20 0353 01/12/20 0128 01/13/20 0139  NA 133* 131* 132*  K 4.8 4.5 4.5  CL 109 109 109  CO2 15* 13* 15*  GLUCOSE 95 135* 117*  BUN 39* 38* 35*  CREATININE 1.38* 1.60* 1.40*  CALCIUM 8.7* 8.5* 8.4*   LFT Recent Labs    01/11/20 0353 01/12/20 0128 01/13/20 0139  PROT 5.2* 4.9* 5.0*  ALBUMIN 3.8 3.2* 3.0*  AST 32 47* 43*  ALT 17 23 23   ALKPHOS 80 105 110  BILITOT 2.4* 1.9* 1.7*    ASSESMENT:   *   dedompensated NASH cirrhosis  *   Progressive ascites.  SBP on fluid of 9/7 delayed plans for TIPS.   Paracentesis w fluid studies negative for SBP on 9/10.  For TIPS tmrw.      *   Umbilical hernia.    *   Anemia,  thrombocytopenia. Low iron, ferritin 121.   Hgb 6.7 >> PRBCs >> 8.4 >> 7.7.   Platelets 87.     *   Coagulopathy INR 1.6 >> 1.4.    *   Hyponatremia at 132.    *    CKD.  Creat 1.6 >> 1.4.     PLAN   *   TIPS tmrw.  Paracentesis planned today.       Azucena Freed  01/13/2020, 8:19 AM Phone [phone]

## 2020-01-14 ENCOUNTER — Inpatient Hospital Stay (HOSPITAL_COMMUNITY): Payer: No Typology Code available for payment source

## 2020-01-14 ENCOUNTER — Inpatient Hospital Stay (HOSPITAL_COMMUNITY): Payer: No Typology Code available for payment source | Admitting: Certified Registered Nurse Anesthetist

## 2020-01-14 ENCOUNTER — Ambulatory Visit (HOSPITAL_COMMUNITY): Payer: No Typology Code available for payment source

## 2020-01-14 ENCOUNTER — Ambulatory Visit (HOSPITAL_COMMUNITY)
Admission: RE | Admit: 2020-01-14 | Discharge: 2020-01-14 | Disposition: A | Discharge status: Home / Self Care | Payer: No Typology Code available for payment source | Source: Ambulatory Visit | Attending: Interventional Radiology | Admitting: Interventional Radiology

## 2020-01-14 ENCOUNTER — Encounter (HOSPITAL_COMMUNITY): Payer: Self-pay | Admitting: Internal Medicine

## 2020-01-14 ENCOUNTER — Encounter (HOSPITAL_COMMUNITY)
Admission: EM | Disposition: A | Discharge status: Home / Self Care | Payer: Self-pay | Source: Home / Self Care | Attending: Internal Medicine

## 2020-01-14 DIAGNOSIS — R188 Other ascites: Secondary | ICD-10-CM | POA: Insufficient documentation

## 2020-01-14 DIAGNOSIS — K7460 Unspecified cirrhosis of liver: Secondary | ICD-10-CM | POA: Diagnosis not present

## 2020-01-14 DIAGNOSIS — Z9689 Presence of other specified functional implants: Secondary | ICD-10-CM

## 2020-01-14 HISTORY — PX: RADIOLOGY WITH ANESTHESIA: SHX6223

## 2020-01-14 HISTORY — PX: IR TIPS: IMG2295

## 2020-01-14 HISTORY — PX: IR PARACENTESIS: IMG2679

## 2020-01-14 LAB — POCT I-STAT 7, (LYTES, BLD GAS, ICA,H+H)
Acid-base deficit: 9 mmol/L — ABNORMAL HIGH (ref 0.0–2.0)
Acid-base deficit: 6 mmol/L — ABNORMAL HIGH (ref 0.0–2.0)
Bicarbonate: 16.4 mmol/L — ABNORMAL LOW (ref 20.0–28.0)
Bicarbonate: 19.6 mmol/L — ABNORMAL LOW (ref 20.0–28.0)
Calcium, Ion: 1.22 mmol/L (ref 1.15–1.40)
Calcium, Ion: 1.18 mmol/L (ref 1.15–1.40)
HCT: 22 % — ABNORMAL LOW (ref 36.0–46.0)
HCT: 27 % — ABNORMAL LOW (ref 36.0–46.0)
Hemoglobin: 7.5 g/dL — ABNORMAL LOW (ref 12.0–15.0)
Hemoglobin: 9.2 g/dL — ABNORMAL LOW (ref 12.0–15.0)
O2 Saturation: 100 %
O2 Saturation: 99 %
Patient temperature: 37.4
Patient temperature: 37
Potassium: 4.4 mmol/L (ref 3.5–5.1)
Potassium: 5.1 mmol/L (ref 3.5–5.1)
Sodium: 137 mmol/L (ref 135–145)
Sodium: 136 mmol/L (ref 135–145)
TCO2: 17 mmol/L — ABNORMAL LOW (ref 22–32)
TCO2: 21 mmol/L — ABNORMAL LOW (ref 22–32)
pCO2 arterial: 35.4 mmHg (ref 32.0–48.0)
pCO2 arterial: 36.6 mmHg (ref 32.0–48.0)
pH, Arterial: 7.277 — ABNORMAL LOW (ref 7.350–7.450)
pH, Arterial: 7.338 — ABNORMAL LOW (ref 7.350–7.450)
pO2, Arterial: 322 mmHg — ABNORMAL HIGH (ref 83.0–108.0)
pO2, Arterial: 141 mmHg — ABNORMAL HIGH (ref 83.0–108.0)

## 2020-01-14 LAB — COMPREHENSIVE METABOLIC PANEL
ALT: 25 U/L (ref 0–44)
AST: 43 U/L — ABNORMAL HIGH (ref 15–41)
Albumin: 2.7 g/dL — ABNORMAL LOW (ref 3.5–5.0)
Alkaline Phosphatase: 118 U/L (ref 38–126)
Anion gap: 7 (ref 5–15)
BUN: 40 mg/dL — ABNORMAL HIGH (ref 6–20)
CO2: 14 mmol/L — ABNORMAL LOW (ref 22–32)
Calcium: 8.4 mg/dL — ABNORMAL LOW (ref 8.9–10.3)
Chloride: 111 mmol/L (ref 98–111)
Creatinine, Ser: 1.38 mg/dL — ABNORMAL HIGH (ref 0.44–1.00)
GFR calc Af Amer: 49 mL/min — ABNORMAL LOW (ref >60)
GFR calc non Af Amer: 42 mL/min — ABNORMAL LOW (ref >60)
Glucose, Bld: 112 mg/dL — ABNORMAL HIGH (ref 70–99)
Potassium: 4.8 mmol/L (ref 3.5–5.1)
Sodium: 132 mmol/L — ABNORMAL LOW (ref 135–145)
Total Bilirubin: 2.2 mg/dL — ABNORMAL HIGH (ref 0.3–1.2)
Total Protein: 5.1 g/dL — ABNORMAL LOW (ref 6.5–8.1)

## 2020-01-14 LAB — CBC
HCT: 23.1 % — ABNORMAL LOW (ref 36.0–46.0)
Hemoglobin: 7.5 g/dL — ABNORMAL LOW (ref 12.0–15.0)
MCH: 29.9 pg (ref 26.0–34.0)
MCHC: 32.5 g/dL (ref 30.0–36.0)
MCV: 92 fL (ref 80.0–100.0)
Platelets: 84 10*3/uL — ABNORMAL LOW (ref 150–400)
RBC: 2.51 MIL/uL — ABNORMAL LOW (ref 3.87–5.11)
RDW: 14.8 % (ref 11.5–15.5)
WBC: 4.2 10*3/uL (ref 4.0–10.5)
nRBC: 0 % (ref 0.0–0.2)

## 2020-01-14 LAB — PROTIME-INR
INR: 1.6 — ABNORMAL HIGH (ref 0.8–1.2)
Prothrombin Time: 18.2 seconds — ABNORMAL HIGH (ref 11.4–15.2)

## 2020-01-14 LAB — GLUCOSE, CAPILLARY
Glucose-Capillary: 102 mg/dL — ABNORMAL HIGH (ref 70–99)
Glucose-Capillary: 133 mg/dL — ABNORMAL HIGH (ref 70–99)

## 2020-01-14 LAB — PATHOLOGIST SMEAR REVIEW

## 2020-01-14 LAB — PREPARE RBC (CROSSMATCH)

## 2020-01-14 SURGERY — IR WITH ANESTHESIA
Anesthesia: General

## 2020-01-14 MED ORDER — IOHEXOL 300 MG/ML  SOLN
150.0000 mL | Freq: Once | INTRAMUSCULAR | Status: AC | PRN
  Administered 2020-01-14: 100 mL via INTRAVENOUS

## 2020-01-14 MED ORDER — PHENYLEPHRINE 40 MCG/ML (10ML) SYRINGE FOR IV PUSH (FOR BLOOD PRESSURE SUPPORT)
PREFILLED_SYRINGE | INTRAVENOUS | Status: DC | PRN
  Administered 2020-01-14: 80 ug via INTRAVENOUS

## 2020-01-14 MED ORDER — METHOCARBAMOL 1000 MG/10ML IJ SOLN
1000.0000 mg | Freq: Three times a day (TID) | INTRAVENOUS | Status: DC | PRN
  Filled 2020-01-14 (×2): qty 10

## 2020-01-14 MED ORDER — CHLORHEXIDINE GLUCONATE 0.12 % MT SOLN: 15.0000 mL | Freq: Once | OROMUCOSAL | Status: AC

## 2020-01-14 MED ORDER — ALBUMIN HUMAN 5 % IV SOLN: INTRAVENOUS | Status: DC | PRN

## 2020-01-14 MED ORDER — SODIUM CHLORIDE 0.9 % IV SOLN: INTRAVENOUS | Status: DC | PRN

## 2020-01-14 MED ORDER — PHENYLEPHRINE HCL-NACL 10-0.9 MG/250ML-% IV SOLN
INTRAVENOUS | Status: DC | PRN
  Administered 2020-01-14: 40 ug/min via INTRAVENOUS

## 2020-01-14 MED ORDER — LACTATED RINGERS IV SOLN: INTRAVENOUS | Status: DC | PRN

## 2020-01-14 MED ORDER — SUGAMMADEX SODIUM 200 MG/2ML IV SOLN
INTRAVENOUS | Status: DC | PRN
  Administered 2020-01-14: 200 mg via INTRAVENOUS

## 2020-01-14 MED ORDER — VANCOMYCIN HCL IN DEXTROSE 1-5 GM/200ML-% IV SOLN
1000.0000 mg | INTRAVENOUS | Status: AC
  Filled 2020-01-14: qty 200

## 2020-01-14 MED ORDER — ROCURONIUM BROMIDE 100 MG/10ML IV SOLN
INTRAVENOUS | Status: DC | PRN
  Administered 2020-01-14: 60 mg via INTRAVENOUS
  Administered 2020-01-14: 20 mg via INTRAVENOUS

## 2020-01-14 MED ORDER — IOHEXOL 300 MG/ML  SOLN
150.0000 mL | Freq: Once | INTRAMUSCULAR | Status: AC | PRN
  Administered 2020-01-14: 1 mL via INTRAVENOUS

## 2020-01-14 MED ORDER — SODIUM CHLORIDE 0.9% IV SOLUTION
Freq: Once | INTRAVENOUS | Status: AC
  Administered 2020-01-16: 300 mL via INTRAVENOUS

## 2020-01-14 MED ORDER — LIDOCAINE 2% (20 MG/ML) 5 ML SYRINGE
INTRAMUSCULAR | Status: DC | PRN
  Administered 2020-01-14: 40 mg via INTRAVENOUS

## 2020-01-14 MED ORDER — LACTATED RINGERS IV SOLN: INTRAVENOUS | Status: DC

## 2020-01-14 MED ORDER — PROPOFOL 10 MG/ML IV BOLUS
INTRAVENOUS | Status: DC | PRN
  Administered 2020-01-14: 100 mg via INTRAVENOUS

## 2020-01-14 MED ORDER — ONDANSETRON HCL 4 MG/2ML IJ SOLN
INTRAMUSCULAR | Status: DC | PRN
  Administered 2020-01-14: 4 mg via INTRAVENOUS

## 2020-01-14 MED ORDER — METHOCARBAMOL 500 MG PO TABS
1000.0000 mg | ORAL_TABLET | Freq: Three times a day (TID) | ORAL | Status: AC | PRN
  Administered 2020-01-14 – 2020-01-15 (×2): 1000 mg via ORAL
  Filled 2020-01-14 (×4): qty 2

## 2020-01-14 MED ORDER — MIDAZOLAM HCL 5 MG/5ML IJ SOLN
INTRAMUSCULAR | Status: DC | PRN
  Administered 2020-01-14: 1 mg via INTRAVENOUS

## 2020-01-14 MED ORDER — SODIUM BICARBONATE 8.4 % IV SOLN
INTRAVENOUS | Status: DC | PRN
  Administered 2020-01-14 (×3): 50 meq via INTRAVENOUS

## 2020-01-14 MED ORDER — FENTANYL CITRATE (PF) 250 MCG/5ML IJ SOLN
INTRAMUSCULAR | Status: DC | PRN
Start: 2020-01-14 — End: 2020-01-14
  Administered 2020-01-14: 100 ug via INTRAVENOUS

## 2020-01-14 MED ORDER — DEXAMETHASONE SODIUM PHOSPHATE 10 MG/ML IJ SOLN
INTRAMUSCULAR | Status: DC | PRN
  Administered 2020-01-14: 5 mg via INTRAVENOUS

## 2020-01-14 MED ORDER — CHLORHEXIDINE GLUCONATE 0.12 % MT SOLN
OROMUCOSAL | Status: AC
  Administered 2020-01-14: 15 mL via OROMUCOSAL
  Filled 2020-01-14: qty 15

## 2020-01-14 NOTE — Procedures (Signed)
Interventional Radiology Procedure Note  History: 59 yo female with cryptogenic cirrhosis, refractory ascites, developing hepatorenal syndrome, presents for TIPs.  While [place] shunt was planned as interval step to TIPS (prior MELD as high as 27), her first episode of SBP was on this admission, contraindication for DS.  MELD now 19-22, and we believe this is best chance for TIPS to occur.    Procedure:  US guided percutaneous portal vein access US guided right IJ access "gunsight TIPS" with percutaneous 21g chiba, connecting IVC to the main portal via 79m x 154mviatorr.  Coil embolization of percutaneous portal vein access. .  Complications: None  Recommendations:  - Stable to PACU - observe for hepatic encephalopathy, defer to GI recommendations for lactulose - observe H&H and hepatic function - agree with abx - routine wound care - VIR to follow - paracentesis as needed - TIPS shunt study can occur after first clinic follow up  - Do not submerge for 7 days    Signed,  JaDulcy FannyWaEarleen NewportDO

## 2020-01-14 NOTE — Anesthesia Procedure Notes (Signed)
Arterial Line Insertion Start/End9/14/2021 9:15 AM Performed by: Candis Shine, CRNA, CRNA  Patient location: Pre-op. Preanesthetic checklist: patient identified, IV checked, site marked, risks and benefits discussed, surgical consent, monitors and equipment checked, pre-op evaluation, timeout performed and anesthesia consent Lidocaine 1% used for infiltration Right, radial was placed Catheter size: 20 G Hand hygiene performed  and maximum sterile barriers used   Attempts: 1 Procedure performed without using ultrasound guided technique. Following insertion, dressing applied and Biopatch. Post procedure assessment: normal and unchanged  Patient tolerated the procedure well with no immediate complications.

## 2020-01-14 NOTE — Progress Notes (Signed)
   Dr Earleen Newport has seen and examined pt today.  No changes  Plan for TIPs in IR today

## 2020-01-14 NOTE — Transfer of Care (Signed)
Immediate Anesthesia Transfer of Care Note  Patient: Meagan Peck  Procedure(s) Performed: TIPS (N/A )  Patient Location: PACU  Anesthesia Type:General  Level of Consciousness: drowsy  Airway & Oxygen Therapy: Patient Spontanous Breathing and Patient connected to face mask oxygen  Post-op Assessment: Report given to RN and Post -op Vital signs reviewed and stable  Post vital signs: Reviewed  Last Vitals:  Vitals Value Taken Time  BP 117/60 01/14/20 1353  Temp 36.7 C 01/14/20 1351  Pulse 98 01/14/20 1355  Resp 21 01/14/20 1355  SpO2 99 % 01/14/20 1355  Vitals shown include unvalidated device data.  Last Pain:  Vitals:   01/14/20 0902  TempSrc:   PainSc: 8       Patients Stated Pain Goal: 2 (88/50/27 7412)  Complications: No complications documented.

## 2020-01-14 NOTE — Progress Notes (Signed)
Subjective:   Patient had her TIPS procedure today and states that everything went well. Surgical sites on R flank without significant pain. She is complaining of paravertebral back pain from her neck to her lower back, starting since the procedure. Also has still had a headache. Mild abdominal pain similar to prior which does not localize. Denies CP, SOB, n/v, leg swelling, fever, chills.  Objective:  Vital signs in last 24 hours: Vitals:   01/14/20 1515 01/14/20 1530 01/14/20 1553 01/14/20 1628  BP: 109/65 (!) 110/58 113/62 (!) 105/53  Pulse: 96 97 95 84  Resp: (!) 24 (!) 26 20 19   Temp:   98.8 F (37.1 C) (!) 97.4 F (36.3 C)  TempSrc:    Oral  SpO2: 91% 93% 94% 98%   General: Patient appears chronically ill. No acute distress.  Respiratory: Lung sounds decreased bilaterally but otherwise CTA without wheezing or rhonchi. Cardiovascular: Regular rate and rhythm. No murmurs, rubs, or gallops. No lower extremity edema. Abdominal: There is significant abdominal distention, similar to previous exam. Mild diffuse tenderness. Minimal voluntary guarding, no rebound. Significant fluid wave present. Bowel sounds normal. Musculoskeletal: Extremities appear cachectic. Paravertebral muscle spasms in the cervical, thoracic, and lumbar areas.  Skin: Skin appears mildly jaundiced. There are purpura along the forearms without active bleeding of the skin. 2x island dressings and 1 bandaid in place over R flank at location of TIPS procedure today. No surrounding erythema or edema. Mildly tender around the areas. No rashes or other lesions noted. Psych: Normal affect. Normal tone of voice.   Assessment/Plan:  Principal Problem:   SBP (spontaneous bacterial peritonitis) ([place]) Active Problems:   Thrombocytopenia ([place])   Type 2 diabetes mellitus with other specified complication ([place])   Cirrhosis ([place])  # Refractory, Decompensated Cirrhosis s/p TIPS 9/14 2/2 likely NAFLD, Complicated by Active  SBP, recurrent large volume ascites, large esophageal varices s/p banding, and thrombocytopenia  Patient was admitted 01/07/20 at the request of her GI physician (Dr. Enis Gash) for workup and management of abdominal pain, bloating and decreased appetite in the setting of SBP. Patient was first diagnosed with cirrhosis around March of this year and receives weekly paracentesis. Paracentesis fluid from 01/07/20 grew E. Coli. Thrombocytopenia, hyperalbuminemia, hyperbilirubinemia, and increase in PT/INR stable. No active signs of bleeding. Most recent paracentesis on 9/13 with negative Gram stain, negative path review, 68 total nucleated cells (improved). S/p TIPS procedure on 9/14. - GI consulted and IR on board, appreciate their recommendations - f/u obtain body fluid culture  - Today was patient's 7th day on Ceftriaxone; consulted pharmacy to dose cefazolin given patient's sensitivity  - Continue to monitor daily CMP and CBC  - Patient may require albumin if hypotensive after procedure, normotensive thus far  # Stable Normocytic Anemia Hemoglobin 7.5 this morning, given 1unit pRBCs, improved to 9.2. S/p 1 other unit this admission on 9/9. Baseline ~ 9. Iron panel shows low iron 27, normal ferritin 121, and saturation ratios unable to be calculated. Does have large volume ascites but no evidence of active bleeding. - Continue to monitor daily CBC's - Will hold on iron replacement therapy given active infection; consider therapy vs. repeat saturation ratios once SBP resolves  # AKI, Resolved # Asymptomatic Bacteriuria # Non-anion gap Metabolic Acidosis Creatinine initially 1.72 with baseline ~ 1.37. BUN 41, GFR 37. Bicarb remains low. Suspect more recent CKD stage IIIa; Creatinine stable. 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 U/A hazy with large leukocytes and rare bacteria, >50 WBC, but no urinary symptoms or CVA  tenderness concerning for treatable infection. U/A showed moderate hemoglobinuria but no RBC's. CK WNL. - Continue to monitor daily CMP  # Diet-Controlled Type II DM Patient had recently been taken off Metformin due to low Hgb A1c of 5.2. BG slightly elevated today.  - Will continue to monitor and initiate SSI if BG remain elevated   # Hypotension [name] blood pressure stable. S/p albumin 100g 9/8 and 9/10. Albumin 2.7 today.  - Will attempt to avoid fluids as much as possible given cirrhosis - Continue to monitor closely; will likely improve s/p paracentesis  # Constipation, Resolved  - Continue Senakot PRN  # Hyperkalemia K+ 4.4 -> 5.1 - borderline, possibly hemolyzed - CMP tomorrow  Barriers to Discharge: Cirrhosis with recurrent ascites and SBP  Andrew Au, MD 01/14/2020, 6:44 PM Pager: [phone] After 5pm on weekdays and 1pm on weekends: On Call pager [phone]

## 2020-01-14 NOTE — Sedation Documentation (Signed)
Deep Portal  Pressure 27

## 2020-01-14 NOTE — Sedation Documentation (Signed)
Free Hepatic Pressure 16/12 mean 14  Wedge pressure 30  Gradient 16

## 2020-01-14 NOTE — Anesthesia Procedure Notes (Signed)
Procedure Name: Intubation Date/Time: 01/14/2020 10:20 AM Performed by: Janene Harvey, CRNA Pre-anesthesia Checklist: Patient identified, Emergency Drugs available, Suction available and Patient being monitored Patient Re-evaluated:Patient Re-evaluated prior to induction Oxygen Delivery Method: Circle system utilized Preoxygenation: Pre-oxygenation with 100% oxygen Induction Type: IV induction Ventilation: Mask ventilation without difficulty and Oral airway inserted - appropriate to patient size Laryngoscope Size: Mac and 4 Grade View: Grade I Tube type: Oral Tube size: 7.0 mm Number of attempts: 1 Airway Equipment and Method: Stylet and Oral airway Placement Confirmation: ETT inserted through vocal cords under direct vision,  positive ETCO2 and breath sounds checked- equal and bilateral Secured at: 22 cm Tube secured with: Tape Dental Injury: Teeth and Oropharynx as per pre-operative assessment

## 2020-01-14 NOTE — Anesthesia Preprocedure Evaluation (Addendum)
Anesthesia Evaluation  Patient identified by MRN, date of birth, ID band Patient awake    Reviewed: Allergy & Precautions, NPO status , [name] Chart, lab work & pertinent test results  Airway Mallampati: II  TM Distance: <3 FB Neck ROM: Full    Dental  (+) Teeth Intact, Dental Advisory Given   Pulmonary neg pulmonary ROS,    breath sounds clear to auscultation       Cardiovascular hypertension,  Rhythm:Regular Rate:Normal     Neuro/Psych negative neurological ROS  negative psych ROS   GI/Hepatic GERD  ,(+) Cirrhosis   Esophageal Varices and ascites    , Hepatitis -  Endo/Other  diabetes  Renal/GU negative Renal ROS     Musculoskeletal  (+) Arthritis ,   Abdominal Normal abdominal exam  (+) - obese,   Peds  Hematology   Anesthesia Other Findings   Reproductive/Obstetrics                            Anesthesia Physical Anesthesia Plan  ASA: III  Anesthesia Plan: General   Post-op Pain Management:    Induction: Intravenous  PONV Risk Score and Plan: 4 or greater and Ondansetron, Dexamethasone, Midazolam and Scopolamine patch - Pre-op  Airway Management Planned: Oral ETT  Additional Equipment: Arterial line  Intra-op Plan:   Post-operative Plan: Extubation in OR  Informed Consent: I have reviewed the patients History and Physical, chart, labs and discussed the procedure including the risks, benefits and alternatives for the proposed anesthesia with the patient or authorized representative who has indicated his/her understanding and acceptance.     Dental advisory given  Plan Discussed with: CRNA  Anesthesia Plan Comments: (Echo:  1. Left ventricular ejection fraction, by estimation, is 60 to 65%. The  left ventricle has normal function. The left ventricle has no regional  wall motion abnormalities. Left ventricular diastolic parameters were  normal.  2. Right  ventricular systolic function is normal. The right ventricular  size is normal. There is normal pulmonary artery systolic pressure.  3. There is mild to moderate posterior annular calcification. Mild mitral  valve regurgitation. No evidence of mitral stenosis.  4. The aortic valve is normal in structure. Aortic valve regurgitation is  not visualized. No aortic stenosis is present.  5. The inferior vena cava is normal in size with greater than 50%  respiratory variability, suggesting right atrial pressure of 3 mmHg. )       Anesthesia Quick Evaluation

## 2020-01-14 NOTE — Progress Notes (Signed)
Patient seen for post-op TIPS procedure performed on 9.14.21 by Dr. Rolla Plate. RIJ and direct portal access sites unremarkable with no bleeding noted. Dressings are clean dry and intact.   Foley catheter to remain in place overnight and can be discontinued on 9.15.21.  Paracentesis as needed. VIR to continue to follow.

## 2020-01-15 ENCOUNTER — Inpatient Hospital Stay (HOSPITAL_COMMUNITY): Payer: No Typology Code available for payment source

## 2020-01-15 ENCOUNTER — Encounter (HOSPITAL_COMMUNITY): Payer: Self-pay | Admitting: Interventional Radiology

## 2020-01-15 DIAGNOSIS — I361 Nonrheumatic tricuspid (valve) insufficiency: Secondary | ICD-10-CM

## 2020-01-15 DIAGNOSIS — I340 Nonrheumatic mitral (valve) insufficiency: Secondary | ICD-10-CM

## 2020-01-15 DIAGNOSIS — D696 Thrombocytopenia, unspecified: Secondary | ICD-10-CM

## 2020-01-15 DIAGNOSIS — R6520 Severe sepsis without septic shock: Secondary | ICD-10-CM

## 2020-01-15 DIAGNOSIS — A419 Sepsis, unspecified organism: Secondary | ICD-10-CM

## 2020-01-15 LAB — COMPREHENSIVE METABOLIC PANEL
ALT: 26 U/L (ref 0–44)
AST: 55 U/L — ABNORMAL HIGH (ref 15–41)
Albumin: 2.6 g/dL — ABNORMAL LOW (ref 3.5–5.0)
Alkaline Phosphatase: 93 U/L (ref 38–126)
Anion gap: 7 (ref 5–15)
BUN: 43 mg/dL — ABNORMAL HIGH (ref 6–20)
CO2: 19 mmol/L — ABNORMAL LOW (ref 22–32)
Calcium: 8.3 mg/dL — ABNORMAL LOW (ref 8.9–10.3)
Chloride: 104 mmol/L (ref 98–111)
Creatinine, Ser: 1.38 mg/dL — ABNORMAL HIGH (ref 0.44–1.00)
GFR calc Af Amer: 49 mL/min — ABNORMAL LOW (ref >60)
GFR calc non Af Amer: 42 mL/min — ABNORMAL LOW (ref >60)
Glucose, Bld: 261 mg/dL — ABNORMAL HIGH (ref 70–99)
Potassium: 5.1 mmol/L (ref 3.5–5.1)
Sodium: 130 mmol/L — ABNORMAL LOW (ref 135–145)
Total Bilirubin: 2 mg/dL — ABNORMAL HIGH (ref 0.3–1.2)
Total Protein: 4.7 g/dL — ABNORMAL LOW (ref 6.5–8.1)

## 2020-01-15 LAB — CULTURE, BODY FLUID W GRAM STAIN -BOTTLE: Culture: NO GROWTH

## 2020-01-15 LAB — CBC
HCT: 28.5 % — ABNORMAL LOW (ref 36.0–46.0)
Hemoglobin: 9.5 g/dL — ABNORMAL LOW (ref 12.0–15.0)
MCH: 29.9 pg (ref 26.0–34.0)
MCHC: 33.3 g/dL (ref 30.0–36.0)
MCV: 89.6 fL (ref 80.0–100.0)
Platelets: 94 10*3/uL — ABNORMAL LOW (ref 150–400)
RBC: 3.18 MIL/uL — ABNORMAL LOW (ref 3.87–5.11)
RDW: 15.4 % (ref 11.5–15.5)
WBC: 5.1 10*3/uL (ref 4.0–10.5)
nRBC: 0 % (ref 0.0–0.2)

## 2020-01-15 LAB — ECHOCARDIOGRAM COMPLETE
Area-P 1/2: 4.68 cm2
S' Lateral: 2.6 cm

## 2020-01-15 NOTE — Progress Notes (Signed)
Subjective:   Patient reports continued back pain which is sharp and constant, improved with Robaxin and heat pack. She reports continued abdominal swelling that is not as severe as it had been prior to her previous paracentesis. She denies fevers, chills, light-headedness, dizziness, insomnia, weakness, fatigue.   Objective:  Vital signs in last 24 hours: Vitals:   01/14/20 2047 01/15/20 0036 01/15/20 0509 01/15/20 1608  BP: (!) 107/55 100/62 98/67 (!) 94/58  Pulse: 95 88 74 78  Resp: 18 18 16 17   Temp: 97.7 F (36.5 C) (!) 97.5 F (36.4 C) (!) 97.5 F (36.4 C) 98.6 F (37 C)  TempSrc: Oral Oral Oral   SpO2: 98% 100% 100% 100%   General: Patient appears chronically ill. No acute distress.  Respiratory: Lung sounds decreased bilaterally but otherwise CTA without wheezing or rhonchi. Cardiovascular: Regular rate and rhythm. No murmurs, rubs, or gallops. 1+ lower extremity pitting edema present, bilaterally.  Abdominal: Abdominal distention improved since yesterday; however, there is significant tenderness over umbilical hernia site with guarding. No increased erythema in this region, hernia is reducible. no rebound. Bowel sounds normal. Musculoskeletal: Extremities appear cachectic. Skin: Skin appears mildly jaundiced. There are purpura along the forearms without active bleeding of the skin. 2x island dressings and 1 bandaid in place over R flank at location of TIPS procedure today. No surrounding erythema or edema. Mildly tender around the areas. No rashes or other lesions noted. Psych: Normal affect. Normal tone of voice.   Assessment/Plan:  Principal Problem:   SBP (spontaneous bacterial peritonitis) ([place]) Active Problems:   Thrombocytopenia ([place])   Type 2 diabetes mellitus with other specified complication ([place])   Cirrhosis ([place])  # Refractory, Decompensated Cirrhosis s/p TIPS 9/14 2/2 likely NAFLD, Complicated by Active SBP, recurrent large volume ascites, large  esophageal varices s/p banding, and thrombocytopenia  Patient was admitted 01/07/20 at the request of her GI physician (Dr. Enis Gash) for workup and management of abdominal pain, bloating and decreased appetite in the setting of SBP. Patient was first diagnosed with cirrhosis around March of this year and receives weekly paracentesis. Paracentesis fluid from 01/07/20 grew E. Coli. Thrombocytopenia, hyperalbuminemia, hyperbilirubinemia, and increase in PT/INR stable. No active signs of bleeding. Most recent paracentesis on 9/13 with negative Gram stain, negative path review, 68 total nucleated cells (improved). S/p TIPS procedure on 9/14. - GI consulted and IR on board, appreciate their recommendations - f/u body fluid culture - Continue cefazolin IV followed by secondary prophylaxis to prevent recurrent SBP after 14 day treatment - Continue to monitor daily CMP and CBC  - will hold off on albumin/fluids for now  - GI on board, appreciate their recommendations - Will get EGD while in-patient for surveillance of varices - conservative management only for umbilical hernia given patient is poor surgical candidate   # Stable Normocytic Anemia Hemoglobin improved this morning. Iron panel shows low iron 27, normal ferritin 121, and saturation ratios unable to be calculated. Does have large volume ascites but no evidence of active bleeding. - Continue to monitor daily CBC's - Will hold on iron replacement therapy given active infection; consider therapy vs. repeat saturation ratios once SBP resolves  # AKI, Resolved # Asymptomatic Bacteriuria # Non-anion gap Metabolic Acidosis Creatinine initially 1.72 with baseline ~ 1.37. BUN 41, GFR 37. Bicarb remains low. Suspect more recent CKD stage IIIa; Creatinine stable. 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 U/A hazy with  large leukocytes and rare bacteria, >50 WBC, but no  urinary symptoms or CVA tenderness concerning for treatable infection. U/A showed moderate hemoglobinuria but no RBC's. CK WNL. - Continue to monitor creatinine   # Diet-Controlled Type II DM Patient had recently been taken off Metformin due to low Hgb A1c of 5.2. BG slightly elevated today.  - Will continue to monitor and initiate SSI if BG remain elevated   # Hypotension [name] blood pressure stable. S/p albumin 100g 9/8 and 9/10. Albumin 2.6 today. - Will attempt to avoid fluids as much as possible given cirrhosis - Continue to monitor closely  # Constipation, Resolved  - Continue Senakot PRN - Consider starting lactulose, although no encephalopathy or asterixis at this time  # Hyperkalemia K+ 4.4 -> 5.1 - stable. - continue to monitor  Barriers to Discharge: Cirrhosis with recurrent ascites and SBP  Jeralyn Bennett, MD 01/15/2020, 6:59 PM Pager: [phone] After 5pm on weekdays and 1pm on weekends: On Call pager [phone]

## 2020-01-15 NOTE — Progress Notes (Signed)
Referring Physician(s): * No referring provider recorded for this case *  Supervising Physician: Corrie Mckusick  Patient Status:  [facility] - In-pt  Chief Complaint: Recurrent ascites  Subjective: Complains of back pain and right-sided flank pain.  Has not passed gas or had a bowel movement.  Otherwise tolerating clear liquids.  No question related to procedure yesterday.  Foley remains in place.   Allergies: Penicillins, Atrovent [ipratropium], Naproxen, Quinine derivatives, and Sulfa antibiotics  Medications: Prior to Admission medications   Medication Sig Start Date End Date Taking? Authorizing Provider  cetirizine (ZYRTEC) 10 MG tablet Take 1 tablet (10 mg total) by mouth daily. Patient taking differently: Take 10 mg by mouth daily as needed for allergies.  01/02/19  Yes [name], DO  ACCU-CHEK FASTCLIX LANCETS MISC Check fsbs TID 08/09/16   [provider]  aspirin 81 MG tablet Take 1 tablet (81 mg total) by mouth daily. Patient taking differently: Take 81 mg by mouth at bedtime.  08/30/19   Cherylann Ratel A, DO  Cholecalciferol (VITAMIN D3) 5000 UNITS TABS Take 5,000 Units by mouth daily.     [provider]  ferrous sulfate 325 (65 FE) MG EC tablet Take 1 tablet (325 mg total) by mouth 2 (two) times daily with a meal. 09/26/18   [name], DO  Krill Oil 500 MG CAPS Take 500 mg by mouth daily.    [provider]  magnesium oxide (MAG-OX) 400 MG tablet Take 1 tablet (400 mg total) by mouth 2 (two) times daily. 01/02/19   [name], DO  mupirocin ointment (BACTROBAN) 2 % Apply 1 application topically 3 (three) times daily. Patient not taking: Reported on 01/08/2020 10/22/19   Kandra Nicolas, MD     Vital Signs: BP 98/67 (BP Location: Left Arm)   Pulse 74   Temp (!) 97.5 F (36.4 C) (Oral)   Resp 16   LMP 12/03/2013 (LMP Unknown)   SpO2 100%   Physical Exam  NAD, alert, lying in bed.  Abdomen: procedure sites intact.   Dressing intact, clean, and dry. Generalized abdominal pain, worse with palpation.   Back/Flank:  Generalized right sided back pain, no point tenderness PV: no peripheral edema.  Neuro: answers all orientationano pronator drift, no asterixis  Imaging: IR Tips  Result Date: 01/14/2020 CLINICAL DATA:  59 year old female with a history of cryptogenic cirrhosis, presents for TIPS EXAM: TRANSJUGULAR INTRAHEPATIC PORTOSYSTEMIC SHUNT; IR PARACENTESIS MEDICATIONS: Patient is on broad-spectrum antibiotics in the hospital ANESTHESIA/SEDATION: General - as administered by the Anesthesia department CONTRAST:  126m OMNIPAQUE IOHEXOL 300 MG/ML SOLN, 158mOMNIPAQUE IOHEXOL 300 MG/ML SOLN FLUOROSCOPY TIME:  Fluoroscopy Time: 30 minutes 42 seconds (412 mGy). COMPLICATIONS: None PROCEDURE: Informed written consent was obtained from the patient's family after a thorough discussion of the procedural risks, benefits and alternatives. Specific risks discussed with TIPS/variceal embolization included: Bleeding, infection, vascular injury, need for further procedure/surgery, renal injury/renal failure, contrast reaction, non-target embolization, liver dysfunction/failure, hepatic encephalopathy, stroke (~1%), cardiopulmonary collapse, death. All questions were addressed. Maximal Sterile Barrier Technique was utilized including caps, mask, sterile gowns, sterile gloves, sterile drape, hand hygiene and skin antiseptic. A timeout was performed prior to the initiation of the procedure. Patient was positioned supine position on the table, with the right upper abdomen and the right neck prepped and draped in the usual sterile fashion. Ultrasound survey of the right upper quadrant was performed, with images stored and sent to PACs. Small incision was made on the right anterior abdominal  wall, at a suitable site for paracentesis. A Safe-T-Centesis catheter was introduced, with confirmation of location with aspiration of ascitic fluid.  Large volume paracentesis was then performed. Using ultrasound guidance, Chiba needle was used access the right portal system. Once we confirmed position in the portal system with portal venography, micro wire was advanced into the inferior mesenteric vein. The tri-axial system of an Envy system was advanced into the portal vein. Venogram confirmed are location within the portal system. The micro wire was then replaced into the inner dilator, placed into the splenic vein. Check flow vial was placed on the back and of the inner dilator. Ultrasound survey was then performed of the right neck, with images stored and sent to PACs. Ultrasound guidance was then used to access the right internal jugular vein with a micro puncture kit. The wire was advanced under fluoroscopy into the right atrium, and a small incision was made. The needle was removed and the dilator was placed. The micro wire in the stiffener were removed and an 035 wire was then passed into the inferior vena cava. Ten French soft tissue dilation was performed on the wire, and then 10 [place] TIPS sheath was placed. Portal venous gradient of approximately 16 was then measured. We then placed a Bentson wire into the splenic vein through the percutaneous transhepatic portal access. The 4 French outer diameter catheter was removed and a 35 cm bright tip 6 French sheath was placed into the portal system. We then removed the Bentson wire on a standard Kumpe the catheter and placed a 180 cm SV 8 microwire as a safety wire. We then advanced in parallel configuration adjacent to the safety wire a 10 mm snare, to the a paths of the portal system and opened this narrows a target. A safety wire was left within the hepatic vein where we measured the hepatic pressures, and the tip sheath was withdrawn to the confluence of the IVC and the draining a Paddock vein. Hepatic venogram was then performed to confirm location. A parallel 25 mm snare was then advanced through the  tips sheath and positioned at the confluence of the hepatic vein and IVC. The image intensifier was then positioned into a steep right anterior oblique and craniocaudal direction to align the 10 mm snare over the 25 mm snare. We then used fluoroscopic guidance to guide a Chiba needle through both of the snare, confirmed in multiple obliquity with fluoroscopic imaging. We then passed an 014 whisper wire through the [place] needle, and up the IVC towards the right heart. At this point we confirmed that the wire was through both of the snare by gently withdrawing first a 25 mm narrowing tightening around the wire, and then tightening the 10 mm snare around the wire. The 25 mm snare was then used to grasp the wire and withdrawal from the tips sheath, externalized at the IJ puncture site. Given that the wire was deformed from withdrawn from the snare and bent, a wire snip for was required to cut at this deformed section of the wire. An 018 coyote balloon was then advanced through the tips sheath, 4 mm x 150 mm, and used to balloon the parenchymal tract to the portal system and through the snare within the portal system. A coaxial CXI 035 catheter and CXI 014 catheter were then advanced over the transjugular/transhepatic wire. Once the 035 catheter was confirmed to be through the snare in the portal system, the inner 014 catheter was removed. Check flow valve  was then placed over the 014 wire on the back of the Viola catheter, and angiogram confirmed location in the portal system. At this point the 014 safety wire was abandoned leaving the tip of the 035 catheter in the portal system. A stiff Glidewire was first attempted to navigate into the portal vein directed towards the splenic vein, which was unsuccessful. We then exchanged the straight 035 CXI crossing catheter on a rose in wire, for a standard 65 cm Kumpe the catheter. This was successful in navigating the Diagnostic wire into the SMV. The Kumpe the catheter was  advanced to the SMV and the wire was removed. Angiogram confirmed location within the portal vein. The rosen wire was then navigated into the SMV for more stable distal position. The tips sheath was attempted to pass over the Rose an wire into the portal system using simply the introducer. This failed given the resistance. 6 mm x 40 mm balloon angioplasty was then performed along the tract. Balloon was withdrawn and another attempt at passing the sheath was performed, failing with the resistance. Diagnostic catheter was then passed in the portal system in the [facility] an wire was exchanged for an Amplatz wire. Again the catheter did not pass through the parenchymal tract, and 8 mm x 40 mm balloon angioplasty was then performed. This identified the interface at the portal system and parenchyma and the IVC and the parenchyma, measuring less than 4 cm in distance. Upon deflating the 8 mm balloon, the tips sheath was passed into the portal system. Angiogram confirmed location. We elected to place a 2 by 10 by 60 via tore covered stent graft, which was deployed through the parenchymal tract. Post dilation was performed 10 mm. Repeat angiogram was performed, confirming excellent antegrade flow through the shunt, and no refluxing contrast on the injection. The 6 French sheath was then withdrawn, injecting contrast during the withdrawal, identifying the parenchymal tract. The parenchymal tract was then coil embolized with a 6 mm x 9.2 mm 035 Nester diamond-shaped coil. Sheath was removed. The IJ sheath was removed with manual pressure performed for hemostasis. Patient remained hemodynamically stable throughout. No complications were encountered and no significant blood loss. The paracentesis yielded 3.3 L. IMPRESSION: Status post direct intrahepatic portosystemic shunt (DIPS), trans caval, via "gunsight" technique utilizing a percutaneous hepatic access to the portal system, for treatment of refractory ascites/hepatorenal  syndrome. The percutaneous access was coil embolized upon withdrawal of the sheath. Large volume paracentesis. Signed, Dulcy Fanny. Dellia Nims, RPVI [facility] Radiology Electronically Signed   By: Corrie Mckusick D.O.   On: 01/14/2020 17:30   US LIVER DOPPLER  Result Date: 01/15/2020 CLINICAL DATA:  59 year old female with history of alcoholic cirrhosis. Evaluate for portal vein patency prior to planned transjugular intrahepatic portosystemic shunt. EXAM: DUPLEX ULTRASOUND OF LIVER TECHNIQUE: Color and duplex Doppler ultrasound was performed to evaluate the hepatic in-flow and out-flow vessels. COMPARISON:  11/12/2019 FINDINGS: Liver: Coarsened hepatic echotexture.  Nodular surface contour. No focal lesion, mass or intrahepatic biliary ductal dilatation. Main Portal Vein size: 1.6 cm Portal Vein Velocities Main Prox:  23 cm/sec Main Mid: 24 cm/sec Main Dist:  23 cm/sec Right: 16 cm/sec Left: 17 cm/sec Hepatic Vein Velocities Right:  73 cm/sec Middle:  24 cm/sec Left:  23 cm/sec IVC: Present and patent with normal respiratory phasicity. Hepatic Artery Velocity:  142 cm/sec Splenic Vein Velocity:  39 cm/sec Spleen: 10.1 cm x 14.2 cm x 6.2 cm with a total  volume of 469 cm^3 (411 cm^3 is upper limit normal) Portal Vein Occlusion/Thrombus: No Splenic Vein Occlusion/Thrombus: No Ascites: Large volume bilateral upper quadrant ascites. Varices: None IMPRESSION: 1. Patent portal system with antegrade flow. 2. Similar appearing cirrhotic morphology with secondary signs of portal hypertension including large volume ascites and splenomegaly. No hepatoma appreciated. Electronically Signed   By: Ruthann Cancer MD   On: 01/15/2020 08:41   IR Paracentesis  Result Date: 01/14/2020 CLINICAL DATA:  59 year old female with a history of cryptogenic cirrhosis, presents for TIPS EXAM: TRANSJUGULAR INTRAHEPATIC PORTOSYSTEMIC SHUNT; IR PARACENTESIS MEDICATIONS: Patient is on broad-spectrum  antibiotics in the hospital ANESTHESIA/SEDATION: General - as administered by the Anesthesia department CONTRAST:  183m OMNIPAQUE IOHEXOL 300 MG/ML SOLN, 142mOMNIPAQUE IOHEXOL 300 MG/ML SOLN FLUOROSCOPY TIME:  Fluoroscopy Time: 30 minutes 42 seconds (412 mGy). COMPLICATIONS: None PROCEDURE: Informed written consent was obtained from the patient's family after a thorough discussion of the procedural risks, benefits and alternatives. Specific risks discussed with TIPS/variceal embolization included: Bleeding, infection, vascular injury, need for further procedure/surgery, renal injury/renal failure, contrast reaction, non-target embolization, liver dysfunction/failure, hepatic encephalopathy, stroke (~1%), cardiopulmonary collapse, death. All questions were addressed. Maximal Sterile Barrier Technique was utilized including caps, mask, sterile gowns, sterile gloves, sterile drape, hand hygiene and skin antiseptic. A timeout was performed prior to the initiation of the procedure. Patient was positioned supine position on the table, with the right upper abdomen and the right neck prepped and draped in the usual sterile fashion. Ultrasound survey of the right upper quadrant was performed, with images stored and sent to PACs. Small incision was made on the right anterior abdominal wall, at a suitable site for paracentesis. A Safe-T-Centesis catheter was introduced, with confirmation of location with aspiration of ascitic fluid. Large volume paracentesis was then performed. Using ultrasound guidance, Chiba needle was used access the right portal system. Once we confirmed position in the portal system with portal venography, micro wire was advanced into the inferior mesenteric vein. The tri-axial system of an Envy system was advanced into the portal vein. Venogram confirmed are location within the portal system. The micro wire was then replaced into the inner dilator, placed into the splenic vein. Check flow vial was  placed on the back and of the inner dilator. Ultrasound survey was then performed of the right neck, with images stored and sent to PACs. Ultrasound guidance was then used to access the right internal jugular vein with a micro puncture kit. The wire was advanced under fluoroscopy into the right atrium, and a small incision was made. The needle was removed and the dilator was placed. The micro wire in the stiffener were removed and an 035 wire was then passed into the inferior vena cava. Ten French soft tissue dilation was performed on the wire, and then 10 [place] sheath was placed. Portal venous gradient of approximately 16 was then measured. We then placed a Bentson wire into the splenic vein through the percutaneous transhepatic portal access. The 4 French outer diameter catheter was removed and a 35 cm bright tip 6 French sheath was placed into the portal system. We then removed the Bentson wire on a standard Kumpe the catheter and placed a 180 cm SV 8 microwire as a safety wire. We then advanced in parallel configuration adjacent to the safety wire a 10 mm snare, to the a paths of the portal system and opened this narrows a target. A safety wire was left within the hepatic vein where we measured the  hepatic pressures, and the tip sheath was withdrawn to the confluence of the IVC and the draining a Paddock vein. Hepatic venogram was then performed to confirm location. A parallel 25 mm snare was then advanced through the tips sheath and positioned at the confluence of the hepatic vein and IVC. The image intensifier was then positioned into a steep right anterior oblique and craniocaudal direction to align the 10 mm snare over the 25 mm snare. We then used fluoroscopic guidance to guide a Chiba needle through both of the snare, confirmed in multiple obliquity with fluoroscopic imaging. We then passed an 014 whisper wire through the [place] needle, and up the IVC towards the right heart. At this point we  confirmed that the wire was through both of the snare by gently withdrawing first a 25 mm narrowing tightening around the wire, and then tightening the 10 mm snare around the wire. The 25 mm snare was then used to grasp the wire and withdrawal from the tips sheath, externalized at the IJ puncture site. Given that the wire was deformed from withdrawn from the snare and bent, a wire snip for was required to cut at this deformed section of the wire. An 018 coyote balloon was then advanced through the tips sheath, 4 mm x 150 mm, and used to balloon the parenchymal tract to the portal system and through the snare within the portal system. A coaxial CXI 035 catheter and CXI 014 catheter were then advanced over the transjugular/transhepatic wire. Once the 035 catheter was confirmed to be through the snare in the portal system, the inner 014 catheter was removed. Check flow valve was then placed over the 014 wire on the back of the 035 CXI catheter, and angiogram confirmed location in the portal system. At this point the 014 safety wire was abandoned leaving the tip of the 035 catheter in the portal system. A stiff Glidewire was first attempted to navigate into the portal vein directed towards the splenic vein, which was unsuccessful. We then exchanged the straight 035 CXI crossing catheter on a rose in wire, for a standard 65 cm Kumpe the catheter. This was successful in navigating the Diagnostic wire into the SMV. The Kumpe the catheter was advanced to the SMV and the wire was removed. Angiogram confirmed location within the portal vein. The rosen wire was then navigated into the SMV for more stable distal position. The tips sheath was attempted to pass over the Rose an wire into the portal system using simply the introducer. This failed given the resistance. 6 mm x 40 mm balloon angioplasty was then performed along the tract. Balloon was withdrawn and another attempt at passing the sheath was performed, failing with the  resistance. Diagnostic catheter was then passed in the portal system in the [facility] an wire was exchanged for an Amplatz wire. Again the catheter did not pass through the parenchymal tract, and 8 mm x 40 mm balloon angioplasty was then performed. This identified the interface at the portal system and parenchyma and the IVC and the parenchyma, measuring less than 4 cm in distance. Upon deflating the 8 mm balloon, the tips sheath was passed into the portal system. Angiogram confirmed location. We elected to place a 2 by 10 by 60 via tore covered stent graft, which was deployed through the parenchymal tract. Post dilation was performed 10 mm. Repeat angiogram was performed, confirming excellent antegrade flow through the shunt, and no refluxing contrast on the injection. The 6 French sheath was  then withdrawn, injecting contrast during the withdrawal, identifying the parenchymal tract. The parenchymal tract was then coil embolized with a 6 mm x 9.2 mm 035 Nester diamond-shaped coil. Sheath was removed. The IJ sheath was removed with manual pressure performed for hemostasis. Patient remained hemodynamically stable throughout. No complications were encountered and no significant blood loss. The paracentesis yielded 3.3 L. IMPRESSION: Status post direct intrahepatic portosystemic shunt (DIPS), trans caval, via "gunsight" technique utilizing a percutaneous hepatic access to the portal system, for treatment of refractory ascites/hepatorenal syndrome. The percutaneous access was coil embolized upon withdrawal of the sheath. Large volume paracentesis. Signed, Dulcy Fanny. Dellia Nims, RPVI [facility] Inter[facility] Radiology Special[facility] Radiology Electronically Signed   By: Corrie Mckusick D.O.   On: 01/14/2020 17:30   IR Paracentesis  Result Date: 01/13/2020 INDICATION: Patient with a history of cirrhosis and recurrent large volume ascites presents today for a therapeutic and diagnostic paracentesis. EXAM: ULTRASOUND  GUIDED PARACENTESIS MEDICATIONS: 1% lidocaine 10 mL COMPLICATIONS: None immediate. PROCEDURE: Informed written consent was obtained from the patient after a discussion of the risks, benefits and alternatives to treatment. A timeout was performed prior to the initiation of the procedure. Initial ultrasound scanning demonstrates a large amount of ascites within the right lower abdominal quadrant. The right lower abdomen was prepped and draped in the usual sterile fashion. 1% lidocaine was used for local anesthesia. Following this, a 19 gauge, 7-cm, Yueh catheter was introduced. An ultrasound image was saved for documentation purposes. The paracentesis was performed. The catheter was removed and a dressing was applied. The patient tolerated the procedure well without immediate post procedural complication. FINDINGS: A total of approximately 4.8 L of cloudy pain fluid was removed. Samples were sent to the laboratory as requested by the clinical team. IMPRESSION: Successful ultrasound-guided paracentesis yielding 4.8 liters of peritoneal fluid. Read by: Soyla Dryer, NP Electronically Signed   By: Corrie Mckusick D.O.   On: 01/13/2020 17:08    Labs:  CBC: Recent Labs    01/12/20 0128 01/12/20 0128 01/13/20 0139 01/13/20 0139 01/14/20 0431 01/14/20 1109 01/14/20 1243 01/15/20 0406  WBC 4.7  --  5.0  --  4.2  --   --  5.1  HGB 7.9*   < > 7.7*   < > 7.5* 7.5* 9.2* 9.5*  HCT 23.6*   < > 23.5*   < > 23.1* 22.0* 27.0* 28.5*  PLT 83*  --  87*  --  84*  --   --  94*   < > = values in this interval not displayed.    COAGS: Recent Labs    11/25/19 0630 12/20/19 0624 01/07/20 1932 01/10/20 0434 01/10/20 0717 01/14/20 0431  INR 1.5*   < > 1.8* 1.6* 1.4* 1.6*  APTT 35  --   --   --   --   --    < > = values in this interval not displayed.    BMP: Recent Labs    01/12/20 0128 01/12/20 0128 01/13/20 0139 01/13/20 0139 01/14/20 0431 01/14/20 1109 01/14/20 1243 01/15/20 0406  NA 131*   < >  132*   < > 132* 137 136 130*  K 4.5   < > 4.5   < > 4.8 4.4 5.1 5.1  CL 109  --  109  --  111  --   --  104  CO2 13*  --  15*  --  14*  --   --  19*  GLUCOSE 135*  --  117*  --  112*  --   --  261*  BUN 38*  --  35*  --  40*  --   --  43*  CALCIUM 8.5*  --  8.4*  --  8.4*  --   --  8.3*  CREATININE 1.60*  --  1.40*  --  1.38*  --   --  1.38*  GFRNONAA 35*  --  42*  --  42*  --   --  42*  GFRAA 41*  --  48*  --  49*  --   --  49*   < > = values in this interval not displayed.    LIVER FUNCTION TESTS: Recent Labs    01/12/20 0128 01/13/20 0139 01/14/20 0431 01/15/20 0406  BILITOT 1.9* 1.7* 2.2* 2.0*  AST 47* 43* 43* 55*  ALT 23 23 25 26   ALKPHOS 105 110 118 93  PROT 4.9* 5.0* 5.1* 4.7*  ALBUMIN 3.2* 3.0* 2.7* 2.6*    Assessment and Plan: Recurrent ascites s/p TIPS by Dr. Earleen Newport 9/14 Patient stable this AM after procedure yesterday.  Complains of back and abdominal pain- potentially related to procedure yesterday, but also still getting abx for SBP.  Vital signs otherwise stable.   Has not yet had a bowel movement.  Will request lift of bed rest order. Encourage to mobilize as able.  Foley still in place.  Order placed for removal.   All procedure sites intact.  Patient assessed alongside Dr. Earleen Newport who discussed the procedure as well as potential complications including the need for lactulose per GI recommendations.  Also discussed possible need for additional paracentesis with the goal being reduced fluid reaccumulation over time.  IR following.  Electronically Signed: Docia Barrier, PA 01/15/2020, 9:54 AM   I spent a total of 25 Minutes at the the patient's bedside AND on the patient's hospital floor or unit, greater than 50% of which was counseling/coordinating care for recurrent ascites.

## 2020-01-15 NOTE — Progress Notes (Signed)
  Echocardiogram 2D Echocardiogram has been performed.  Meagan Peck 01/15/2020, 9:54 AM

## 2020-01-15 NOTE — H&P (View-Only) (Signed)
Daily Rounding Note  01/15/2020, 11:48 AM  LOS: 7 days   SUBJECTIVE:   Chief complaint:  Ascites, cirrhosis   C/o progressive abdominal pain emanating from umbilical hernia. No n/v, she is hungry.    OBJECTIVE:         Vital signs in last 24 hours:    Temp:  [97.4 F (36.3 C)-98.8 F (37.1 C)] 97.5 F (36.4 C) (09/15 0509) Pulse Rate:  [74-99] 74 (09/15 0509) Resp:  [16-31] 16 (09/15 0509) BP: (98-118)/(53-67) 98/67 (09/15 0509) SpO2:  [91 %-100 %] 100 % (09/15 0509) Arterial Line BP: (124-134)/(46-53) 126/46 (09/14 1515) Last BM Date: 01/12/20 There were no vitals filed for this visit. General: looks tired   Heart: RRR Chest: clear bil.   Abdomen: Soft, mild distention.  Diffuse tenderness greatest in region of umbilical hernia which is slightly purple.  BS active.  Extremities: no CCE Neuro/Psych:  Alert. Oriented x 3.  No asterixis.    Intake/Output from previous day: 09/14 0701 - 09/15 0700 In: 2065 [I.V.:1500; Blood:315; IV Piggyback:250] Out: 3600 [Urine:275; Blood:25]  Intake/Output this shift: Total I/O In: 220 [P.O.:220] Out: 400 [Urine:400]  Lab Results: Recent Labs    01/13/20 0139 01/13/20 0139 01/14/20 0431 01/14/20 0431 01/14/20 1109 01/14/20 1243 01/15/20 0406  WBC 5.0  --  4.2  --   --   --  5.1  HGB 7.7*   < > 7.5*   < > 7.5* 9.2* 9.5*  HCT 23.5*   < > 23.1*   < > 22.0* 27.0* 28.5*  PLT 87*  --  84*  --   --   --  94*   < > = values in this interval not displayed.   BMET Recent Labs    01/13/20 0139 01/13/20 0139 01/14/20 0431 01/14/20 0431 01/14/20 1109 01/14/20 1243 01/15/20 0406  NA 132*   < > 132*   < > 137 136 130*  K 4.5   < > 4.8   < > 4.4 5.1 5.1  CL 109  --  111  --   --   --  104  CO2 15*  --  14*  --   --   --  19*  GLUCOSE 117*  --  112*  --   --   --  261*  BUN 35*  --  40*  --   --   --  43*  CREATININE 1.40*  --  1.38*  --   --   --  1.38*  CALCIUM 8.4*   --  8.4*  --   --   --  8.3*   < > = values in this interval not displayed.   LFT Recent Labs    01/13/20 0139 01/14/20 0431 01/15/20 0406  PROT 5.0* 5.1* 4.7*  ALBUMIN 3.0* 2.7* 2.6*  AST 43* 43* 55*  ALT 23 25 26   ALKPHOS 110 118 93  BILITOT 1.7* 2.2* 2.0*   PT/INR Recent Labs    01/14/20 0431  LABPROT 18.2*  INR 1.6*   Hepatitis Panel No results for input(s): HEPBSAG, HCVAB, HEPAIGM, HEPBIGM in the last 72 hours.  Studies/Results: IR Tips  Result Date: 01/14/2020  PROCEDURE: Informed written consent was obtained from the patient's family after a thorough discussion of the procedural risks, benefits and alternatives. Specific risks discussed with TIPS/variceal embolization included: Bleeding, infection, vascular injury, need for further procedure/surgery, renal injury/renal failure, contrast reaction, non-target embolization, liver dysfunction/failure, hepatic  encephalopathy, stroke (~1%), cardiopulmonary collapse, death. All questions were addressed. Maximal Sterile Barrier Technique was utilized including caps, mask, sterile gowns, sterile gloves, sterile drape, hand hygiene and skin antiseptic. A timeout was performed prior to the initiation of the procedure. Patient was positioned supine position on the table, with the right upper abdomen and the right neck prepped and draped in the usual sterile fashion. Ultrasound survey of the right upper quadrant was performed, with images stored and sent to PACs. Small incision was made on the right anterior abdominal wall, at a suitable site for paracentesis. A Safe-T-Centesis catheter was introduced, with confirmation of location with aspiration of ascitic fluid. Large volume paracentesis was then performed. Using ultrasound guidance, Chiba needle was used access the right portal system. Once we confirmed position in the portal system with portal venography, micro wire was advanced into the inferior mesenteric vein. The tri-axial system of  an Envy system was advanced into the portal vein. Venogram confirmed are location within the portal system. The micro wire was then replaced into the inner dilator, placed into the splenic vein. Check flow vial was placed on the back and of the inner dilator. Ultrasound survey was then performed of the right neck, with images stored and sent to PACs. Ultrasound guidance was then used to access the right internal jugular vein with a micro puncture kit. The wire was advanced under fluoroscopy into the right atrium, and a small incision was made. The needle was removed and the dilator was placed. The micro wire in the stiffener were removed and an 035 wire was then passed into the inferior vena cava. Ten French soft tissue dilation was performed on the wire, and then 10 [place] TIPS sheath was placed. Portal venous gradient of approximately 16 was then measured. We then placed a Bentson wire into the splenic vein through the percutaneous transhepatic portal access. The 4 French outer diameter catheter was removed and a 35 cm bright tip 6 French sheath was placed into the portal system. We then removed the Bentson wire on a standard Kumpe the catheter and placed a 180 cm SV 8 microwire as a safety wire. We then advanced in parallel configuration adjacent to the safety wire a 10 mm snare, to the a paths of the portal system and opened this narrows a target. A safety wire was left within the hepatic vein where we measured the hepatic pressures, and the tip sheath was withdrawn to the confluence of the IVC and the draining a Paddock vein. Hepatic venogram was then performed to confirm location. A parallel 25 mm snare was then advanced through the tips sheath and positioned at the confluence of the hepatic vein and IVC. The image intensifier was then positioned into a steep right anterior oblique and craniocaudal direction to align the 10 mm snare over the 25 mm snare. We then used fluoroscopic guidance to guide a Chiba  needle through both of the snare, confirmed in multiple obliquity with fluoroscopic imaging. We then passed an 014 whisper wire through the Ragsdale needle, and up the IVC towards the right heart. At this point we confirmed that the wire was through both of the snare by gently withdrawing first a 25 mm narrowing tightening around the wire, and then tightening the 10 mm snare around the wire. The 25 mm snare was then used to grasp the wire and withdrawal from the tips sheath, externalized at the IJ puncture site. Given that the wire was deformed from withdrawn from the snare and  bent, a wire snip for was required to cut at this deformed section of the wire. An 018 coyote balloon was then advanced through the tips sheath, 4 mm x 150 mm, and used to balloon the parenchymal tract to the portal system and through the snare within the portal system. A coaxial CXI 035 catheter and CXI 014 catheter were then advanced over the transjugular/transhepatic wire. Once the 035 catheter was confirmed to be through the snare in the portal system, the inner 014 catheter was removed. Check flow valve was then placed over the 014 wire on the back of the 035 CXI catheter, and angiogram confirmed location in the portal system. At this point the 014 safety wire was abandoned leaving the tip of the 035 catheter in the portal system. A stiff Glidewire was first attempted to navigate into the portal vein directed towards the splenic vein, which was unsuccessful. We then exchanged the straight 035 CXI crossing catheter on a rose in wire, for a standard 65 cm Kumpe the catheter. This was successful in navigating the Diagnostic wire into the SMV. The Kumpe the catheter was advanced to the SMV and the wire was removed. Angiogram confirmed location within the portal vein. The rosen wire was then navigated into the SMV for more stable distal position. The tips sheath was attempted to pass over the Rose an wire into the portal system using simply the  introducer. This failed given the resistance. 6 mm x 40 mm balloon angioplasty was then performed along the tract. Balloon was withdrawn and another attempt at passing the sheath was performed, failing with the resistance. Diagnostic catheter was then passed in the portal system in the [facility] an wire was exchanged for an Amplatz wire. Again the catheter did not pass through the parenchymal tract, and 8 mm x 40 mm balloon angioplasty was then performed. This identified the interface at the portal system and parenchyma and the IVC and the parenchyma, measuring less than 4 cm in distance. Upon deflating the 8 mm balloon, the tips sheath was passed into the portal system. Angiogram confirmed location. We elected to place a 2 by 10 by 60 via tore covered stent graft, which was deployed through the parenchymal tract. Post dilation was performed 10 mm. Repeat angiogram was performed, confirming excellent antegrade flow through the shunt, and no refluxing contrast on the injection. The 6 French sheath was then withdrawn, injecting contrast during the withdrawal, identifying the parenchymal tract. The parenchymal tract was then coil embolized with a 6 mm x 9.2 mm 035 Nester diamond-shaped coil. Sheath was removed. The IJ sheath was removed with manual pressure performed for hemostasis. Patient remained hemodynamically stable throughout. No complications were encountered and no significant blood loss. The paracentesis yielded 3.3 L. IMPRESSION: Status post direct intrahepatic portosystemic shunt (DIPS), trans caval, via "gunsight" technique utilizing a percutaneous hepatic access to the portal system, for treatment of refractory ascites/hepatorenal syndrome. The percutaneous access was coil embolized upon withdrawal of the sheath. Large volume paracentesis. Signed, Dulcy Fanny. Dellia Nims, RPVI [facility] Electronically Signed   By: Corrie Mckusick D.O.   On: 01/14/2020  17:30   ECHOCARDIOGRAM COMPLETE  Result Date: 01/15/2020 IMPRESSIONS  1. Left ventricular ejection fraction, by estimation, is 60 to 65%. The left ventricle has normal function. The left ventricle has no regional wall motion abnormalities. Left ventricular diastolic parameters were normal.  2. Right ventricular systolic function is normal. The right ventricular size is normal. There is  mildly elevated pulmonary artery systolic pressure.  3. The mitral valve is normal in structure. Mild mitral valve regurgitation.  4. The aortic valve is normal in structure. Aortic valve regurgitation is not visualized.     Mertie Moores MD Electronically signed by Mertie Moores MD Signature Date/Time: 01/15/2020/10:40:06 AM    Final    US LIVER DOPPLER  Result Date: 01/15/2020 CLINICAL DATA:  59 year old female with history of alcoholic cirrhosis. Evaluate for portal vein patency prior to planned transjugular intrahepatic portosystemic shunt. EXAM: DUPLEX ULTRASOUND OF LIVER TECHNIQUE: Color and duplex Doppler ultrasound was performed to evaluate the hepatic in-flow and out-flow vessels. COMPARISON:  11/12/2019 FINDINGS: Liver: Coarsened hepatic echotexture.  Nodular surface contour. No focal lesion, mass or intrahepatic biliary ductal dilatation. Main Portal Vein size: 1.6 cm Portal Vein Velocities Main Prox:  23 cm/sec Main Mid: 24 cm/sec Main Dist:  23 cm/sec Right: 16 cm/sec Left: 17 cm/sec Hepatic Vein Velocities Right:  73 cm/sec Middle:  24 cm/sec Left:  23 cm/sec IVC: Present and patent with normal respiratory phasicity. Hepatic Artery Velocity:  142 cm/sec Splenic Vein Velocity:  39 cm/sec Spleen: 10.1 cm x 14.2 cm x 6.2 cm with a total volume of 469 cm^3 (411 cm^3 is upper limit normal) Portal Vein Occlusion/Thrombus: No Splenic Vein Occlusion/Thrombus: No Ascites: Large volume bilateral upper quadrant ascites. Varices: None IMPRESSION: 1. Patent portal system with antegrade flow. 2. Similar appearing cirrhotic  morphology with secondary signs of portal hypertension including large volume ascites and splenomegaly. No hepatoma appreciated. Electronically Signed   By: Ruthann Cancer MD   On: 01/15/2020 08:41   IR Paracentesis  Result Date: 01/13/2020  PROCEDURE: Informed written consent was obtained from the patient after a discussion of the risks, benefits and alternatives to treatment. A timeout was performed prior to the initiation of the procedure. Initial ultrasound scanning demonstrates a large amount of ascites within the right lower abdominal quadrant. The right lower abdomen was prepped and draped in the usual sterile fashion. 1% lidocaine was used for local anesthesia. Following this, a 19 gauge, 7-cm, Yueh catheter was introduced. An ultrasound image was saved for documentation purposes. The paracentesis was performed. The catheter was removed and a dressing was applied. The patient tolerated the procedure well without immediate post procedural complication. FINDINGS: A total of approximately 4.8 L of cloudy pain fluid was removed. Samples were sent to the laboratory as requested by the clinical team. IMPRESSION: Successful ultrasound-guided paracentesis yielding 4.8 liters of peritoneal fluid. Read by: Soyla Dryer, NP Electronically Signed   By: Corrie Mckusick D.O.   On: 01/13/2020 17:08   Scheduled Meds: . sodium chloride   Intravenous Once   Continuous Infusions: .  ceFAZolin (ANCEF) IV 2 g (01/15/20 0609)   PRN Meds:.acetaminophen, lidocaine, methocarbamol, senna-docusate  ASSESMENT:   *   Refractory ascites requiring LVPs  Resolved SBP dx'd 3/7, grew E coli. abx day 7. Rocephin 9/8 - 9/13, Ancef d 3: 9/13 - present 4.8 liter paracentesis and DIPS placed 9/14.    *   NASH cirrhosis.    *   Umbilical hernia, causing progressive pain.    *   Coagulopathy.    *   Hyponatremia. Na 130  *   CKD, creat stable and improved.    *    [place] anemia.  hgb 7.5 >> 9.5Low iron, ferritin 121.     PRBC x 2, 1 on 9/9, 1 on 9/14.   09/19/2019 EGD: 5 bands placed to large, non-bleeding  varices   PLAN   *   Advance to 2 liter restricted but O/w regular diet   *    ? eval umbilical hernia w CT?, ? Ask surgeons to eval hernia?   *   ? Timing of next EGD for variceal surveillance?   *   ? When can we stop abx for resolved SBP, given abdominal pain will leave abx in place for now.     Azucena Freed  01/15/2020, 11:48 AM Phone [phone]   Attending physician's note   I have taken an interval history, reviewed the chart and examined the patient. I agree with the Advanced Practitioner's note, impression and recommendations.    59 year old female with cryptogenic/NASH and working on notes okay thank you cirrhosis, refractory ascites, SBP s/p TIPS  MELD 16  No encephalopathy or asterixis on exam Continue lactulose with goal 3 soft bowel movements per day  Most recent ascites fluid 9/13 negative for SBP.  E. coli bacteremia continue 14-day course.  Will need secondary prophylaxis to prevent recurrent SBP after completion of current course of antibiotics  EGD May 2021 with banding of large esophageal varices  We will plan to proceed with EGD while she is inpatient for surveillance, if still has persistent varices to obliterate  Complains of pain near umbilical hernia, is reducible no evidence of obstruction.  She is not a surgical candidate, continue conservative management    The risks and benefits as well as alternatives of endoscopic procedure(s) have been discussed and reviewed. All questions answered. The patient agrees to proceed.  I have spent 35 minutes of patient care (this includes precharting, chart review, review of results, face-to-face time used for counseling as well as treatment plan and follow-up. The patient was provided an opportunity to ask questions and all were answered. The patient agreed with the plan and demonstrated an understanding of the  instructions.  Damaris Hippo , MD [id number]

## 2020-01-15 NOTE — Progress Notes (Addendum)
Daily Rounding Note  01/15/2020, 11:48 AM  LOS: 7 days   SUBJECTIVE:   Chief complaint:  Ascites, cirrhosis   C/o progressive abdominal pain emanating from umbilical hernia. No n/v, she is hungry.    OBJECTIVE:         Vital signs in last 24 hours:    Temp:  [97.4 F (36.3 C)-98.8 F (37.1 C)] 97.5 F (36.4 C) (09/15 0509) Pulse Rate:  [74-99] 74 (09/15 0509) Resp:  [16-31] 16 (09/15 0509) BP: (98-118)/(53-67) 98/67 (09/15 0509) SpO2:  [91 %-100 %] 100 % (09/15 0509) Arterial Line BP: (124-134)/(46-53) 126/46 (09/14 1515) Last BM Date: 01/12/20 There were no vitals filed for this visit. General: looks tired   Heart: RRR Chest: clear bil.   Abdomen: Soft, mild distention.  Diffuse tenderness greatest in region of umbilical hernia which is slightly purple.  BS active.  Extremities: no CCE Neuro/Psych:  Alert. Oriented x 3.  No asterixis.    Intake/Output from previous day: 09/14 0701 - 09/15 0700 In: 2065 [I.V.:1500; Blood:315; IV Piggyback:250] Out: 3600 [Urine:275; Blood:25]  Intake/Output this shift: Total I/O In: 220 [P.O.:220] Out: 400 [Urine:400]  Lab Results: Recent Labs    01/13/20 0139 01/13/20 0139 01/14/20 0431 01/14/20 0431 01/14/20 1109 01/14/20 1243 01/15/20 0406  WBC 5.0  --  4.2  --   --   --  5.1  HGB 7.7*   < > 7.5*   < > 7.5* 9.2* 9.5*  HCT 23.5*   < > 23.1*   < > 22.0* 27.0* 28.5*  PLT 87*  --  84*  --   --   --  94*   < > = values in this interval not displayed.   BMET Recent Labs    01/13/20 0139 01/13/20 0139 01/14/20 0431 01/14/20 0431 01/14/20 1109 01/14/20 1243 01/15/20 0406  NA 132*   < > 132*   < > 137 136 130*  K 4.5   < > 4.8   < > 4.4 5.1 5.1  CL 109  --  111  --   --   --  104  CO2 15*  --  14*  --   --   --  19*  GLUCOSE 117*  --  112*  --   --   --  261*  BUN 35*  --  40*  --   --   --  43*  CREATININE 1.40*  --  1.38*  --   --   --  1.38*  CALCIUM 8.4*   --  8.4*  --   --   --  8.3*   < > = values in this interval not displayed.   LFT Recent Labs    01/13/20 0139 01/14/20 0431 01/15/20 0406  PROT 5.0* 5.1* 4.7*  ALBUMIN 3.0* 2.7* 2.6*  AST 43* 43* 55*  ALT 23 25 26   ALKPHOS 110 118 93  BILITOT 1.7* 2.2* 2.0*   PT/INR Recent Labs    01/14/20 0431  LABPROT 18.2*  INR 1.6*   Hepatitis Panel No results for input(s): HEPBSAG, HCVAB, HEPAIGM, HEPBIGM in the last 72 hours.  Studies/Results: IR Tips  Result Date: 01/14/2020  PROCEDURE: Informed written consent was obtained from the patient's family after a thorough discussion of the procedural risks, benefits and alternatives. Specific risks discussed with TIPS/variceal embolization included: Bleeding, infection, vascular injury, need for further procedure/surgery, renal injury/renal failure, contrast reaction, non-target embolization, liver dysfunction/failure, hepatic  encephalopathy, stroke (~1%), cardiopulmonary collapse, death. All questions were addressed. Maximal Sterile Barrier Technique was utilized including caps, mask, sterile gowns, sterile gloves, sterile drape, hand hygiene and skin antiseptic. A timeout was performed prior to the initiation of the procedure. Patient was positioned supine position on the table, with the right upper abdomen and the right neck prepped and draped in the usual sterile fashion. Ultrasound survey of the right upper quadrant was performed, with images stored and sent to PACs. Small incision was made on the right anterior abdominal wall, at a suitable site for paracentesis. A Safe-T-Centesis catheter was introduced, with confirmation of location with aspiration of ascitic fluid. Large volume paracentesis was then performed. Using ultrasound guidance, Chiba needle was used access the right portal system. Once we confirmed position in the portal system with portal venography, micro wire was advanced into the inferior mesenteric vein. The tri-axial system of  an Envy system was advanced into the portal vein. Venogram confirmed are location within the portal system. The micro wire was then replaced into the inner dilator, placed into the splenic vein. Check flow vial was placed on the back and of the inner dilator. Ultrasound survey was then performed of the right neck, with images stored and sent to PACs. Ultrasound guidance was then used to access the right internal jugular vein with a micro puncture kit. The wire was advanced under fluoroscopy into the right atrium, and a small incision was made. The needle was removed and the dilator was placed. The micro wire in the stiffener were removed and an 035 wire was then passed into the inferior vena cava. Ten French soft tissue dilation was performed on the wire, and then 10 [place] TIPS sheath was placed. Portal venous gradient of approximately 16 was then measured. We then placed a Bentson wire into the splenic vein through the percutaneous transhepatic portal access. The 4 French outer diameter catheter was removed and a 35 cm bright tip 6 French sheath was placed into the portal system. We then removed the Bentson wire on a standard Kumpe the catheter and placed a 180 cm SV 8 microwire as a safety wire. We then advanced in parallel configuration adjacent to the safety wire a 10 mm snare, to the a paths of the portal system and opened this narrows a target. A safety wire was left within the hepatic vein where we measured the hepatic pressures, and the tip sheath was withdrawn to the confluence of the IVC and the draining a Paddock vein. Hepatic venogram was then performed to confirm location. A parallel 25 mm snare was then advanced through the tips sheath and positioned at the confluence of the hepatic vein and IVC. The image intensifier was then positioned into a steep right anterior oblique and craniocaudal direction to align the 10 mm snare over the 25 mm snare. We then used fluoroscopic guidance to guide a Chiba  needle through both of the snare, confirmed in multiple obliquity with fluoroscopic imaging. We then passed an 014 whisper wire through the Nile needle, and up the IVC towards the right heart. At this point we confirmed that the wire was through both of the snare by gently withdrawing first a 25 mm narrowing tightening around the wire, and then tightening the 10 mm snare around the wire. The 25 mm snare was then used to grasp the wire and withdrawal from the tips sheath, externalized at the IJ puncture site. Given that the wire was deformed from withdrawn from the snare and  bent, a wire snip for was required to cut at this deformed section of the wire. An 018 coyote balloon was then advanced through the tips sheath, 4 mm x 150 mm, and used to balloon the parenchymal tract to the portal system and through the snare within the portal system. A coaxial CXI 035 catheter and CXI 014 catheter were then advanced over the transjugular/transhepatic wire. Once the 035 catheter was confirmed to be through the snare in the portal system, the inner 014 catheter was removed. Check flow valve was then placed over the 014 wire on the back of the 035 CXI catheter, and angiogram confirmed location in the portal system. At this point the 014 safety wire was abandoned leaving the tip of the 035 catheter in the portal system. A stiff Glidewire was first attempted to navigate into the portal vein directed towards the splenic vein, which was unsuccessful. We then exchanged the straight 035 CXI crossing catheter on a rose in wire, for a standard 65 cm Kumpe the catheter. This was successful in navigating the Diagnostic wire into the SMV. The Kumpe the catheter was advanced to the SMV and the wire was removed. Angiogram confirmed location within the portal vein. The rosen wire was then navigated into the SMV for more stable distal position. The tips sheath was attempted to pass over the Rose an wire into the portal system using simply the  introducer. This failed given the resistance. 6 mm x 40 mm balloon angioplasty was then performed along the tract. Balloon was withdrawn and another attempt at passing the sheath was performed, failing with the resistance. Diagnostic catheter was then passed in the portal system in the [facility] an wire was exchanged for an Amplatz wire. Again the catheter did not pass through the parenchymal tract, and 8 mm x 40 mm balloon angioplasty was then performed. This identified the interface at the portal system and parenchyma and the IVC and the parenchyma, measuring less than 4 cm in distance. Upon deflating the 8 mm balloon, the tips sheath was passed into the portal system. Angiogram confirmed location. We elected to place a 2 by 10 by 60 via tore covered stent graft, which was deployed through the parenchymal tract. Post dilation was performed 10 mm. Repeat angiogram was performed, confirming excellent antegrade flow through the shunt, and no refluxing contrast on the injection. The 6 French sheath was then withdrawn, injecting contrast during the withdrawal, identifying the parenchymal tract. The parenchymal tract was then coil embolized with a 6 mm x 9.2 mm 035 Nester diamond-shaped coil. Sheath was removed. The IJ sheath was removed with manual pressure performed for hemostasis. Patient remained hemodynamically stable throughout. No complications were encountered and no significant blood loss. The paracentesis yielded 3.3 L. IMPRESSION: Status post direct intrahepatic portosystemic shunt (DIPS), trans caval, via "gunsight" technique utilizing a percutaneous hepatic access to the portal system, for treatment of refractory ascites/hepatorenal syndrome. The percutaneous access was coil embolized upon withdrawal of the sheath. Large volume paracentesis. Signed, Dulcy Fanny. Dellia Nims, RPVI [facility] Radiology Electronically Signed   By: Corrie Mckusick D.O.   On: 01/14/2020  17:30   ECHOCARDIOGRAM COMPLETE  Result Date: 01/15/2020 IMPRESSIONS  1. Left ventricular ejection fraction, by estimation, is 60 to 65%. The left ventricle has normal function. The left ventricle has no regional wall motion abnormalities. Left ventricular diastolic parameters were normal.  2. Right ventricular systolic function is normal. The right ventricular size is normal. There is  mildly elevated pulmonary artery systolic pressure.  3. The mitral valve is normal in structure. Mild mitral valve regurgitation.  4. The aortic valve is normal in structure. Aortic valve regurgitation is not visualized.     Mertie Moores MD Electronically signed by Mertie Moores MD Signature Date/Time: 01/15/2020/10:40:06 AM    Final    US LIVER DOPPLER  Result Date: 01/15/2020 CLINICAL DATA:  59 year old female with history of alcoholic cirrhosis. Evaluate for portal vein patency prior to planned transjugular intrahepatic portosystemic shunt. EXAM: DUPLEX ULTRASOUND OF LIVER TECHNIQUE: Color and duplex Doppler ultrasound was performed to evaluate the hepatic in-flow and out-flow vessels. COMPARISON:  11/12/2019 FINDINGS: Liver: Coarsened hepatic echotexture.  Nodular surface contour. No focal lesion, mass or intrahepatic biliary ductal dilatation. Main Portal Vein size: 1.6 cm Portal Vein Velocities Main Prox:  23 cm/sec Main Mid: 24 cm/sec Main Dist:  23 cm/sec Right: 16 cm/sec Left: 17 cm/sec Hepatic Vein Velocities Right:  73 cm/sec Middle:  24 cm/sec Left:  23 cm/sec IVC: Present and patent with normal respiratory phasicity. Hepatic Artery Velocity:  142 cm/sec Splenic Vein Velocity:  39 cm/sec Spleen: 10.1 cm x 14.2 cm x 6.2 cm with a total volume of 469 cm^3 (411 cm^3 is upper limit normal) Portal Vein Occlusion/Thrombus: No Splenic Vein Occlusion/Thrombus: No Ascites: Large volume bilateral upper quadrant ascites. Varices: None IMPRESSION: 1. Patent portal system with antegrade flow. 2. Similar appearing cirrhotic  morphology with secondary signs of portal hypertension including large volume ascites and splenomegaly. No hepatoma appreciated. Electronically Signed   By: Ruthann Cancer MD   On: 01/15/2020 08:41   IR Paracentesis  Result Date: 01/13/2020  PROCEDURE: Informed written consent was obtained from the patient after a discussion of the risks, benefits and alternatives to treatment. A timeout was performed prior to the initiation of the procedure. Initial ultrasound scanning demonstrates a large amount of ascites within the right lower abdominal quadrant. The right lower abdomen was prepped and draped in the usual sterile fashion. 1% lidocaine was used for local anesthesia. Following this, a 19 gauge, 7-cm, Yueh catheter was introduced. An ultrasound image was saved for documentation purposes. The paracentesis was performed. The catheter was removed and a dressing was applied. The patient tolerated the procedure well without immediate post procedural complication. FINDINGS: A total of approximately 4.8 L of cloudy pain fluid was removed. Samples were sent to the laboratory as requested by the clinical team. IMPRESSION: Successful ultrasound-guided paracentesis yielding 4.8 liters of peritoneal fluid. Read by: Soyla Dryer, NP Electronically Signed   By: Corrie Mckusick D.O.   On: 01/13/2020 17:08   Scheduled Meds: . sodium chloride   Intravenous Once   Continuous Infusions: .  ceFAZolin (ANCEF) IV 2 g (01/15/20 0609)   PRN Meds:.acetaminophen, lidocaine, methocarbamol, senna-docusate  ASSESMENT:   *   Refractory ascites requiring LVPs  Resolved SBP dx'd 3/7, grew E coli. abx day 7. Rocephin 9/8 - 9/13, Ancef d 3: 9/13 - present 4.8 liter paracentesis and DIPS placed 9/14.    *   NASH cirrhosis.    *   Umbilical hernia, causing progressive pain.    *   Coagulopathy.    *   Hyponatremia. Na 130  *   CKD, creat stable and improved.    *    Mulga anemia.  hgb 7.5 >> 9.5Low iron, ferritin 121.     PRBC x 2, 1 on 9/9, 1 on 9/14.   09/19/2019 EGD: 5 bands placed to large, non-bleeding  varices   PLAN   *   Advance to 2 liter restricted but O/w regular diet   *    ? eval umbilical hernia w CT?, ? Ask surgeons to eval hernia?   *   ? Timing of next EGD for variceal surveillance?   *   ? When can we stop abx for resolved SBP, given abdominal pain will leave abx in place for now.     Azucena Freed  01/15/2020, 11:48 AM Phone [phone]   Attending physician's note   I have taken an interval history, reviewed the chart and examined the patient. I agree with the Advanced Practitioner's note, impression and recommendations.    59 year old female with cryptogenic/NASH and working on notes okay thank you cirrhosis, refractory ascites, SBP s/p TIPS  MELD 16  No encephalopathy or asterixis on exam Continue lactulose with goal 3 soft bowel movements per day  Most recent ascites fluid 9/13 negative for SBP.  E. coli bacteremia continue 14-day course.  Will need secondary prophylaxis to prevent recurrent SBP after completion of current course of antibiotics  EGD May 2021 with banding of large esophageal varices  We will plan to proceed with EGD while she is inpatient for surveillance, if still has persistent varices to obliterate  Complains of pain near umbilical hernia, is reducible no evidence of obstruction.  She is not a surgical candidate, continue conservative management    The risks and benefits as well as alternatives of endoscopic procedure(s) have been discussed and reviewed. All questions answered. The patient agrees to proceed.  I have spent 35 minutes of patient care (this includes precharting, chart review, review of results, face-to-face time used for counseling as well as treatment plan and follow-up. The patient was provided an opportunity to ask questions and all were answered. The patient agreed with the plan and demonstrated an understanding of the  instructions.  Damaris Hippo , MD [phone]

## 2020-01-15 NOTE — Anesthesia Postprocedure Evaluation (Signed)
Anesthesia Post Note  Patient: Meagan Peck  Procedure(s) Performed: TIPS (N/A )     Patient location during evaluation: PACU Anesthesia Type: General Level of consciousness: awake and alert Pain management: pain level controlled Vital Signs Assessment: post-procedure vital signs reviewed and stable Respiratory status: spontaneous breathing, nonlabored ventilation, respiratory function stable and patient connected to nasal cannula oxygen Cardiovascular status: blood pressure returned to baseline and stable Postop Assessment: no apparent nausea or vomiting Anesthetic complications: no   No complications documented.  Last Pain:  Vitals:   01/15/20 0509  TempSrc: Oral  PainSc:                  [name]

## 2020-01-15 NOTE — Evaluation (Signed)
Physical Therapy Evaluation Patient Details Name: Meagan Peck MRN: [id number] DOB: March 25, 1961 Today's Date: 01/15/2020   History of Present Illness  59 year old female with a history of diabetes, hypertension, and recently diagnosed cirrhosis with weekly paracentesis presenting with abdominal pain and a recent paracentesis that showed findings concerning for SBP.  Patient was contacted by her GI doctor and advised to come into the ED for further work-up and management of the SBP. Patient reports that since Monday, 01/06/2020, she has been having some feelings of shakiness and cold, right-sided abdominal pain, mildly decreased appetite, and abdominal bloating.  Also endorses some headaches. Pt underwent US guided paracentesis from RLQ on 9/10 and 9/13. Pt then underwent TIPs procedure on 9/14.  Clinical Impression  Pt presents to PT with deficits in LE strength, power, gait, balance, functional mobility, and activity tolerance. Pt requires UE support to steady during transfers and ambulation, mildly unsteady and needing BUE support to maintain balance with ambulation. Pt will benefit from continued acute PT POC to improve activity tolerance and balance quality while reducing the pt's falls risk. PT recommends discharge home with HHPT and intermittent assistance from spouse.    Follow Up Recommendations Home health PT;Supervision - Intermittent    Equipment Recommendations  None recommended by PT    Recommendations for Other Services       Precautions / Restrictions Precautions Precautions: Fall Restrictions Weight Bearing Restrictions: No      Mobility  Bed Mobility               General bed mobility comments: pt received and left in recliner, bed mobility assessment deferred  Transfers Overall transfer level: Needs assistance Equipment used: Quad cane Transfers: Sit to/from Stand Sit to Stand: Min guard            Ambulation/Gait Ambulation/Gait assistance: Hydrologist (Feet): 25 Feet Assistive device: 1 person hand held assist;Quad cane (small based quad cane) Gait Pattern/deviations: Step-to pattern Gait velocity: reduced Gait velocity interpretation: <1.8 ft/sec, indicate of risk for recurrent falls General Gait Details: pt with shortened step to gait pattern with reduced step length  Stairs            Wheelchair Mobility    Modified Rankin (Stroke Patients Only)       Balance Overall balance assessment: Needs assistance Sitting-balance support: No upper extremity supported;Feet supported Sitting balance-Leahy Scale: Good     Standing balance support: Single extremity supported;Bilateral upper extremity supported Standing balance-Leahy Scale: Poor Standing balance comment: reliant on UE support for balance, single UE support for static                             Pertinent Vitals/Pain Pain Assessment: Faces Faces Pain Scale: Hurts little more Pain Location: abdomen Pain Descriptors / Indicators: Grimacing Pain Intervention(s): Monitored during session    Home Living Family/patient expects to be discharged to:: Private residence Living Arrangements: Spouse/significant other;Children Available Help at Discharge: Family;Available 24 hours/day Type of Home: House Home Access: Stairs to enter Entrance Stairs-Rails: Right;Left (R for walkway and L at house) Entrance Stairs-Number of Steps: 1+1+1, and then 2 Home Layout: One level Home Equipment: Other (comment);Walker - 2 wheels (small based quad cane)      Prior Function Level of Independence: Needs assistance   Gait / Transfers Assistance Needed: pt ambulates with use of SBQC, assist for stair negotiation  ADL's / Homemaking Assistance Needed: Pt performs ADLs independently, intermittent  assistance required for tub transfers, spouse assists with IADLs        Hand Dominance        Extremity/Trunk Assessment   Upper Extremity  Assessment Upper Extremity Assessment: Overall WFL for tasks assessed    Lower Extremity Assessment Lower Extremity Assessment: Overall WFL for tasks assessed    Cervical / Trunk Assessment Cervical / Trunk Assessment: Normal  Communication   Communication: No difficulties  Cognition Arousal/Alertness: Awake/alert Behavior During Therapy: WFL for tasks assessed/performed Overall Cognitive Status: Within Functional Limits for tasks assessed                                        General Comments General comments (skin integrity, edema, etc.): VSS, pt on 2L [place] initially upon PT arrival, PT weans pt to room air and sats remain at 99% or above. Pt left on room air at end of session    Exercises     Assessment/Plan    PT Assessment Patient needs continued PT services  PT Problem List Decreased strength;Decreased activity tolerance;Decreased balance;Decreased mobility       PT Treatment Interventions Gait training;DME instruction;Stair training;Functional mobility training;Therapeutic activities;Therapeutic exercise;Balance training;Neuromuscular re-education;Patient/family education    PT Goals (Current goals can be found in the Care Plan section)  Acute Rehab PT Goals Patient Stated Goal: To improve strength and mobility quality PT Goal Formulation: With patient Time For Goal Achievement: 01/29/20 Potential to Achieve Goals: Good    Frequency Min 3X/week   Barriers to discharge        Co-evaluation               AM-PAC PT "6 Clicks" Mobility  Outcome Measure Help needed turning from your back to your side while in a flat bed without using bedrails?: None Help needed moving from lying on your back to sitting on the side of a flat bed without using bedrails?: A Little Help needed moving to and from a bed to a chair (including a wheelchair)?: A Little Help needed standing up from a chair using your arms (e.g., wheelchair or bedside chair)?: A  Little Help needed to walk in hospital room?: A Little Help needed climbing 3-5 steps with a railing? : A Lot 6 Click Score: 18    End of Session Equipment Utilized During Treatment: Oxygen Activity Tolerance: Patient tolerated treatment well Patient left: in chair;with call bell/phone within reach Nurse Communication: Mobility status PT Visit Diagnosis: Other abnormalities of gait and mobility (R26.89);Unsteadiness on feet (R26.81);Muscle weakness (generalized) (M62.81)    Time: 2482-5003 PT Time Calculation (min) (ACUTE ONLY): 15 min   Charges:   PT Evaluation $PT Eval Moderate Complexity: 1 Mod          Zenaida Niece, PT, DPT Acute Rehabilitation Pager: [phone]   Zenaida Niece 01/15/2020, 4:38 PM

## 2020-01-16 ENCOUNTER — Other Ambulatory Visit: Payer: Self-pay | Admitting: Physician Assistant

## 2020-01-16 ENCOUNTER — Encounter (HOSPITAL_COMMUNITY): Payer: Self-pay | Admitting: Internal Medicine

## 2020-01-16 ENCOUNTER — Inpatient Hospital Stay (HOSPITAL_COMMUNITY): Payer: No Typology Code available for payment source | Admitting: Certified Registered"

## 2020-01-16 ENCOUNTER — Encounter (HOSPITAL_COMMUNITY)
Admission: EM | Disposition: A | Discharge status: Home / Self Care | Payer: Self-pay | Source: Home / Self Care | Attending: Internal Medicine

## 2020-01-16 DIAGNOSIS — K5520 Angiodysplasia of colon without hemorrhage: Secondary | ICD-10-CM

## 2020-01-16 DIAGNOSIS — K2289 Angiodysplasia of stomach and duodenum without bleeding: Secondary | ICD-10-CM

## 2020-01-16 DIAGNOSIS — K7469 Other cirrhosis of liver: Secondary | ICD-10-CM

## 2020-01-16 DIAGNOSIS — K766 Portal hypertension: Secondary | ICD-10-CM

## 2020-01-16 DIAGNOSIS — K31819 Angiodysplasia of stomach and duodenum without bleeding: Secondary | ICD-10-CM

## 2020-01-16 HISTORY — PX: HOT HEMOSTASIS: SHX5433

## 2020-01-16 HISTORY — PX: ESOPHAGOGASTRODUODENOSCOPY (EGD) WITH PROPOFOL: SHX5813

## 2020-01-16 LAB — BASIC METABOLIC PANEL
Anion gap: 8 (ref 5–15)
BUN: 41 mg/dL — ABNORMAL HIGH (ref 6–20)
CO2: 19 mmol/L — ABNORMAL LOW (ref 22–32)
Calcium: 8.4 mg/dL — ABNORMAL LOW (ref 8.9–10.3)
Chloride: 102 mmol/L (ref 98–111)
Creatinine, Ser: 1.35 mg/dL — ABNORMAL HIGH (ref 0.44–1.00)
GFR calc Af Amer: 50 mL/min — ABNORMAL LOW (ref >60)
GFR calc non Af Amer: 43 mL/min — ABNORMAL LOW (ref >60)
Glucose, Bld: 252 mg/dL — ABNORMAL HIGH (ref 70–99)
Potassium: 4.2 mmol/L (ref 3.5–5.1)
Sodium: 129 mmol/L — ABNORMAL LOW (ref 135–145)

## 2020-01-16 LAB — CBC
HCT: 29.7 % — ABNORMAL LOW (ref 36.0–46.0)
Hemoglobin: 9.9 g/dL — ABNORMAL LOW (ref 12.0–15.0)
MCH: 30.7 pg (ref 26.0–34.0)
MCHC: 33.3 g/dL (ref 30.0–36.0)
MCV: 92 fL (ref 80.0–100.0)
Platelets: 100 10*3/uL — ABNORMAL LOW (ref 150–400)
RBC: 3.23 MIL/uL — ABNORMAL LOW (ref 3.87–5.11)
RDW: 15.4 % (ref 11.5–15.5)
WBC: 9.2 10*3/uL (ref 4.0–10.5)
nRBC: 0 % (ref 0.0–0.2)

## 2020-01-16 SURGERY — ESOPHAGOGASTRODUODENOSCOPY (EGD) WITH PROPOFOL
Anesthesia: Monitor Anesthesia Care

## 2020-01-16 MED ORDER — SENNOSIDES-DOCUSATE SODIUM 8.6-50 MG PO TABS: 1.0000 | ORAL_TABLET | Freq: Every day | ORAL | Status: DC

## 2020-01-16 MED ORDER — ADULT MULTIVITAMIN W/MINERALS CH
1.0000 | ORAL_TABLET | Freq: Every day | ORAL | Status: DC
  Administered 2020-01-16 – 2020-01-22 (×7): 1 via ORAL
  Filled 2020-01-16 (×7): qty 1

## 2020-01-16 MED ORDER — ONDANSETRON HCL 4 MG/2ML IJ SOLN
INTRAMUSCULAR | Status: DC | PRN
  Administered 2020-01-16: 4 mg via INTRAVENOUS

## 2020-01-16 MED ORDER — ENSURE ENLIVE PO LIQD
237.0000 mL | Freq: Two times a day (BID) | ORAL | Status: DC
  Administered 2020-01-16: 237 mL via ORAL

## 2020-01-16 MED ORDER — RAMELTEON 8 MG PO TABS
8.0000 mg | ORAL_TABLET | Freq: Every day | ORAL | Status: DC
  Administered 2020-01-16 – 2020-01-21 (×6): 8 mg via ORAL
  Filled 2020-01-16 (×7): qty 1

## 2020-01-16 MED ORDER — LACTULOSE 10 GM/15ML PO SOLN
20.0000 g | Freq: Three times a day (TID) | ORAL | Status: DC
  Administered 2020-01-16 – 2020-01-17 (×3): 20 g via ORAL
  Filled 2020-01-16 (×3): qty 30

## 2020-01-16 MED ORDER — LACTATED RINGERS IV SOLN: INTRAVENOUS | Status: DC

## 2020-01-16 MED ORDER — SENNOSIDES-DOCUSATE SODIUM 8.6-50 MG PO TABS: 1.0000 | ORAL_TABLET | Freq: Every day | ORAL | Status: DC | PRN

## 2020-01-16 MED ORDER — PROPOFOL 500 MG/50ML IV EMUL
INTRAVENOUS | Status: DC | PRN
  Administered 2020-01-16: 75 ug/kg/min via INTRAVENOUS

## 2020-01-16 MED ORDER — LIDOCAINE HCL (CARDIAC) PF 100 MG/5ML IV SOSY
PREFILLED_SYRINGE | INTRAVENOUS | Status: DC | PRN
  Administered 2020-01-16: 20 mg via INTRATRACHEAL

## 2020-01-16 MED ORDER — PANTOPRAZOLE SODIUM 40 MG PO TBEC
40.0000 mg | DELAYED_RELEASE_TABLET | Freq: Every day | ORAL | Status: DC
  Administered 2020-01-16 – 2020-01-22 (×7): 40 mg via ORAL
  Filled 2020-01-16 (×7): qty 1

## 2020-01-16 SURGICAL SUPPLY — 15 items

## 2020-01-16 NOTE — Progress Notes (Addendum)
Pharmacy Antibiotic Note  Meagan Peck is a 59 y.o. female admitted on 01/07/2020 with Ecoli SBP.  Pharmacy has been consulted for cefazolin dosing.  Renal function stable, ClCr ~36 ml/min. Afebrile, WBC wnl. Noted PCN allergy as a child and tolerated ceftriaxone, has been tolerating cefazolin. Treat for two weeks per GI then continue cipro 500 daily for SBP prophylaxis  Plan: Continue cefazolin 2g Q8 hr until 9/21; consider change to PO cephalexin 571m Q6 hr or PO cipro 5020mBID to complete treatment course On 9/22, start cipro 50023maily for SBP ppx     Temp (24hrs), Avg:97.8 F (36.6 C), Min:97.5 F (36.4 C), Max:98.6 F (37 C)  Recent Labs  Lab 01/12/20 0128 01/13/20 0139 01/14/20 0431 01/15/20 0406 01/16/20 0459 01/16/20 1015  WBC 4.7 5.0 4.2 5.1  --  9.2  CREATININE 1.60* 1.40* 1.38* 1.38* 1.35*  --     Estimated Creatinine Clearance: 36.5 mL/min (A) (by C-G formula based on SCr of 1.35 mg/dL (H)).    Allergies  Allergen Reactions  . Penicillins Anaphylaxis    Happened as a young child  . Atrovent [Ipratropium] Palpitations    As per patient  . Naproxen Rash  . Quinine Derivatives Rash  . Sulfa Antibiotics Rash    Antimicrobials this admission: Ceftriaxone 9/8 >> 9/13 Cefazolin 9/14 >>    Microbiology results: 9/7 BCx: ngtd 9/7 ascites: Ecoli (Pan-S) 9/10 ascites: ngtd   Thank you for allowing pharmacy to be a part of this patient's care.   LydBenetta SparharmD, BCPS, BCCP Clinical Pharmacist  Please check AMION for all [facility] numbers After 10:00 PM, call [phone]

## 2020-01-16 NOTE — Anesthesia Procedure Notes (Signed)
Procedure Name: MAC Date/Time: 01/16/2020 8:48 AM Performed by: Lavell Luster, CRNA Pre-anesthesia Checklist: Patient identified, Suction available, Emergency Drugs available, Patient being monitored and Timeout performed Patient Re-evaluated:Patient Re-evaluated prior to induction Oxygen Delivery Method: Nasal cannula Preoxygenation: Pre-oxygenation with 100% oxygen Induction Type: IV induction Placement Confirmation: breath sounds checked- equal and bilateral and positive ETCO2 Dental Injury: Teeth and Oropharynx as per pre-operative assessment

## 2020-01-16 NOTE — Progress Notes (Signed)
Supervising Physician: Corrie Mckusick  Patient Status:  [facility] - In-pt  Chief Complaint:  NASH cirrhosis with ascites and varices  Brief History:  Meagan Peck is a 59 y.o. female who is well known to IR service.  She has NASH Cirrhosis with Refractory ascites and has undergone several paracentesis procedures.  We have attempted TIPS 2-3 other times but has been rescheduled and cancelled secondary high MELD and SBP.  MELD down to 19 so Dr. Earleen Newport felt it was safe to proceed with TIPS.  Procedure done on 01/14/20.  She is continuing to do well. No signs of encephalopathy at this point.  No recurrence of ascites yet.  Subjective:  No complaints. Asked if I would remove dressing from her neck as it was pulling her skin.  Allergies: Penicillins, Atrovent [ipratropium], Naproxen, Quinine derivatives, and Sulfa antibiotics  Medications: Prior to Admission medications   Medication Sig Start Date End Date Taking? Authorizing Provider  cetirizine (ZYRTEC) 10 MG tablet Take 1 tablet (10 mg total) by mouth daily. Patient taking differently: Take 10 mg by mouth daily as needed for allergies.  01/02/19  Yes [name], DO  ACCU-CHEK FASTCLIX LANCETS MISC Check fsbs TID 08/09/16   [provider]  aspirin 81 MG tablet Take 1 tablet (81 mg total) by mouth daily. Patient taking differently: Take 81 mg by mouth at bedtime.  08/30/19   Cherylann Ratel A, DO  Cholecalciferol (VITAMIN D3) 5000 UNITS TABS Take 5,000 Units by mouth daily.     [provider]  ferrous sulfate 325 (65 FE) MG EC tablet Take 1 tablet (325 mg total) by mouth 2 (two) times daily with a meal. 09/26/18   [name], DO  Krill Oil 500 MG CAPS Take 500 mg by mouth daily.    [provider]  magnesium oxide (MAG-OX) 400 MG tablet Take 1 tablet (400 mg total) by mouth 2 (two) times daily. 01/02/19   [name], DO  mupirocin ointment (BACTROBAN) 2 % Apply 1 application  topically 3 (three) times daily. Patient not taking: Reported on 01/08/2020 10/22/19   Kandra Nicolas, MD     Vital Signs: BP (!) 96/55 (BP Location: Left Arm)   Pulse 70   Temp 97.8 F (36.6 C) (Oral)   Resp 18   Wt 64.3 kg   LMP 12/03/2013 (LMP Unknown)   SpO2 100%   BMI 29.63 kg/m   Physical Exam Vitals reviewed.  Constitutional:      Appearance: Normal appearance.  HENT:     Head: Normocephalic and atraumatic.  Neck:     Comments: Jugular access site looks good. Cardiovascular:     Rate and Rhythm: Normal rate.  Pulmonary:     Effort: Pulmonary effort is normal. No respiratory distress.  Abdominal:     General: There is no distension.     Palpations: Abdomen is soft.     Comments: Bandages removed from stick sites, no bleeding.  Neurological:     General: No focal deficit present.     Mental Status: She is alert and oriented to person, place, and time.  Psychiatric:        Mood and Affect: Mood normal.        Behavior: Behavior normal.        Thought Content: Thought content normal.        Judgment: Judgment normal.     Imaging: IR Tips  Result Date: 01/14/2020 CLINICAL DATA:  59 year old female with a  history of cryptogenic cirrhosis, presents for TIPS EXAM: TRANSJUGULAR INTRAHEPATIC PORTOSYSTEMIC SHUNT; IR PARACENTESIS MEDICATIONS: Patient is on broad-spectrum antibiotics in the hospital ANESTHESIA/SEDATION: General - as administered by the Anesthesia department CONTRAST:  189m OMNIPAQUE IOHEXOL 300 MG/ML SOLN, 136mOMNIPAQUE IOHEXOL 300 MG/ML SOLN FLUOROSCOPY TIME:  Fluoroscopy Time: 30 minutes 42 seconds (412 mGy). COMPLICATIONS: None PROCEDURE: Informed written consent was obtained from the patient's family after a thorough discussion of the procedural risks, benefits and alternatives. Specific risks discussed with TIPS/variceal embolization included: Bleeding, infection, vascular injury, need for further procedure/surgery, renal injury/renal failure, contrast  reaction, non-target embolization, liver dysfunction/failure, hepatic encephalopathy, stroke (~1%), cardiopulmonary collapse, death. All questions were addressed. Maximal Sterile Barrier Technique was utilized including caps, mask, sterile gowns, sterile gloves, sterile drape, hand hygiene and skin antiseptic. A timeout was performed prior to the initiation of the procedure. Patient was positioned supine position on the table, with the right upper abdomen and the right neck prepped and draped in the usual sterile fashion. Ultrasound survey of the right upper quadrant was performed, with images stored and sent to PACs. Small incision was made on the right anterior abdominal wall, at a suitable site for paracentesis. A Safe-T-Centesis catheter was introduced, with confirmation of location with aspiration of ascitic fluid. Large volume paracentesis was then performed. Using ultrasound guidance, Chiba needle was used access the right portal system. Once we confirmed position in the portal system with portal venography, micro wire was advanced into the inferior mesenteric vein. The tri-axial system of an Envy system was advanced into the portal vein. Venogram confirmed are location within the portal system. The micro wire was then replaced into the inner dilator, placed into the splenic vein. Check flow vial was placed on the back and of the inner dilator. Ultrasound survey was then performed of the right neck, with images stored and sent to PACs. Ultrasound guidance was then used to access the right internal jugular vein with a micro puncture kit. The wire was advanced under fluoroscopy into the right atrium, and a small incision was made. The needle was removed and the dilator was placed. The micro wire in the stiffener were removed and an 035 wire was then passed into the inferior vena cava. Ten French soft tissue dilation was performed on the wire, and then 10 Fr[place] sheath was placed. Portal venous gradient of  approximately 16 was then measured. We then placed a Bentson wire into the splenic vein through the percutaneous transhepatic portal access. The 4 French outer diameter catheter was removed and a 35 cm bright tip 6 French sheath was placed into the portal system. We then removed the Bentson wire on a standard Kumpe the catheter and placed a 180 cm SV 8 microwire as a safety wire. We then advanced in parallel configuration adjacent to the safety wire a 10 mm snare, to the a paths of the portal system and opened this narrows a target. A safety wire was left within the hepatic vein where we measured the hepatic pressures, and the tip sheath was withdrawn to the confluence of the IVC and the draining a Paddock vein. Hepatic venogram was then performed to confirm location. A parallel 25 mm snare was then advanced through the tips sheath and positioned at the confluence of the hepatic vein and IVC. The image intensifier was then positioned into a steep right anterior oblique and craniocaudal direction to align the 10 mm snare over the 25 mm snare. We then used fluoroscopic guidance to guide  a Chiba needle through both of the snare, confirmed in multiple obliquity with fluoroscopic imaging. We then passed an 014 whisper wire through the [place] needle, and up the IVC towards the right heart. At this point we confirmed that the wire was through both of the snare by gently withdrawing first a 25 mm narrowing tightening around the wire, and then tightening the 10 mm snare around the wire. The 25 mm snare was then used to grasp the wire and withdrawal from the tips sheath, externalized at the IJ puncture site. Given that the wire was deformed from withdrawn from the snare and bent, a wire snip for was required to cut at this deformed section of the wire. An 018 coyote balloon was then advanced through the tips sheath, 4 mm x 150 mm, and used to balloon the parenchymal tract to the portal system and through the snare within the  portal system. A coaxial CXI 035 catheter and CXI 014 catheter were then advanced over the transjugular/transhepatic wire. Once the 035 catheter was confirmed to be through the snare in the portal system, the inner 014 catheter was removed. Check flow valve was then placed over the 014 wire on the back of the 035 CXI catheter, and angiogram confirmed location in the portal system. At this point the 014 safety wire was abandoned leaving the tip of the 035 catheter in the portal system. A stiff Glidewire was first attempted to navigate into the portal vein directed towards the splenic vein, which was unsuccessful. We then exchanged the straight 035 CXI crossing catheter on a rose in wire, for a standard 65 cm Kumpe the catheter. This was successful in navigating the Diagnostic wire into the SMV. The Kumpe the catheter was advanced to the SMV and the wire was removed. Angiogram confirmed location within the portal vein. The rosen wire was then navigated into the SMV for more stable distal position. The tips sheath was attempted to pass over the Rose an wire into the portal system using simply the introducer. This failed given the resistance. 6 mm x 40 mm balloon angioplasty was then performed along the tract. Balloon was withdrawn and another attempt at passing the sheath was performed, failing with the resistance. Diagnostic catheter was then passed in the portal system in the [facility] an wire was exchanged for an Amplatz wire. Again the catheter did not pass through the parenchymal tract, and 8 mm x 40 mm balloon angioplasty was then performed. This identified the interface at the portal system and parenchyma and the IVC and the parenchyma, measuring less than 4 cm in distance. Upon deflating the 8 mm balloon, the tips sheath was passed into the portal system. Angiogram confirmed location. We elected to place a 2 by 10 by 60 via tore covered stent graft, which was deployed through the parenchymal tract. Post dilation was  performed 10 mm. Repeat angiogram was performed, confirming excellent antegrade flow through the shunt, and no refluxing contrast on the injection. The 6 French sheath was then withdrawn, injecting contrast during the withdrawal, identifying the parenchymal tract. The parenchymal tract was then coil embolized with a 6 mm x 9.2 mm 035 Nester diamond-shaped coil. Sheath was removed. The IJ sheath was removed with manual pressure performed for hemostasis. Patient remained hemodynamically stable throughout. No complications were encountered and no significant blood loss. The paracentesis yielded 3.3 L. IMPRESSION: Status post direct intrahepatic portosystemic shunt (DIPS), trans caval, via "gunsight" technique utilizing a percutaneous hepatic access to the portal system,  for treatment of refractory ascites/hepatorenal syndrome. The percutaneous access was coil embolized upon withdrawal of the sheath. Large volume paracentesis. Signed, Dulcy Fanny. Dellia Nims, RPVI [facility] Radiology Electronically Signed   By: Corrie Mckusick D.O.   On: 01/14/2020 17:30   ECHOCARDIOGRAM COMPLETE  Result Date: 01/15/2020    ECHOCARDIOGRAM REPORT   Patient Name:   Meagan Peck Date of Exam: 01/15/2020 Medical Rec #:  [id number]       Height:       58.0 in Accession #:    [id number]      Weight:       150.0 lb Date of Birth:  1960-06-15       BSA:          1.612 m Patient Age:    23 years        BP:           98/67 mmHg Patient Gender: F               HR:           74 bpm. Exam Location:  Inpatient Procedure: 2D Echo, Cardiac Doppler and Color Doppler Indications:    Acites  History:        Patient has prior history of Echocardiogram examinations, most                 recent 07/25/2019. Risk Factors:Diabetes and Hypertension.                 Alcoholic cirrohosis.  Sonographer:    Dustin Flock Referring Phys: [phone] [place]  1. Left ventricular ejection fraction, by  estimation, is 60 to 65%. The left ventricle has normal function. The left ventricle has no regional wall motion abnormalities. Left ventricular diastolic parameters were normal.  2. Right ventricular systolic function is normal. The right ventricular size is normal. There is mildly elevated pulmonary artery systolic pressure.  3. The mitral valve is normal in structure. Mild mitral valve regurgitation.  4. The aortic valve is normal in structure. Aortic valve regurgitation is not visualized. FINDINGS  Left Ventricle: Left ventricular ejection fraction, by estimation, is 60 to 65%. The left ventricle has normal function. The left ventricle has no regional wall motion abnormalities. The left ventricular internal cavity size was normal in size. There is  no left ventricular hypertrophy. Left ventricular diastolic parameters were normal. Right Ventricle: The right ventricular size is normal. No increase in right ventricular wall thickness. Right ventricular systolic function is normal. There is mildly elevated pulmonary artery systolic pressure. The tricuspid regurgitant velocity is 2.76  m/s, and with an assumed right atrial pressure of 8 mmHg, the estimated right ventricular systolic pressure is 57.2 mmHg. Left Atrium: Left atrial size was normal in size. Right Atrium: Right atrial size was normal in size. Pericardium: There is no evidence of pericardial effusion. Mitral Valve: The mitral valve is normal in structure. Mild mitral valve regurgitation. Tricuspid Valve: The tricuspid valve is grossly normal. Tricuspid valve regurgitation is mild . No evidence of tricuspid stenosis. Aortic Valve: The aortic valve is normal in structure. Aortic valve regurgitation is not visualized. Pulmonic Valve: The pulmonic valve was normal in structure. Pulmonic valve regurgitation is not visualized. No evidence of pulmonic stenosis. Aorta: The aortic root and ascending aorta are structurally normal, with no evidence of dilitation.  IAS/Shunts: The atrial septum is grossly normal.  LEFT VENTRICLE PLAX 2D LVIDd:  4.80 cm  Diastology LVIDs:         2.60 cm  LV e' medial:    7.51 cm/s LV PW:         0.90 cm  LV E/e' medial:  19.3 LV IVS:        0.80 cm  LV e' lateral:   11.30 cm/s LVOT diam:     1.70 cm  LV E/e' lateral: 12.8 LV SV:         69 LV SV Index:   43 LVOT Area:     2.27 cm  RIGHT VENTRICLE RV Basal diam:  3.70 cm RV S prime:     10.20 cm/s TAPSE (M-mode): 3.3 cm LEFT ATRIUM             Index       RIGHT ATRIUM           Index LA diam:        4.10 cm 2.54 cm/m  RA Area:     13.90 cm LA Vol (A2C):   65.1 ml 40.40 ml/m RA Volume:   31.50 ml  19.55 ml/m LA Vol (A4C):   40.3 ml 25.01 ml/m LA Biplane Vol: 53.4 ml 33.14 ml/m  AORTIC VALVE LVOT Vmax:   130.00 cm/s LVOT Vmean:  94.300 cm/s LVOT VTI:    0.304 m  AORTA Ao Root diam: 2.60 cm MITRAL VALVE                TRICUSPID VALVE MV Area (PHT): 4.68 cm     TR Peak grad:   30.5 mmHg MV Decel Time: 162 msec     TR Vmax:        276.00 cm/s MV E velocity: 145.00 cm/s MV A velocity: 106.00 cm/s  SHUNTS MV E/A ratio:  1.37         Systemic VTI:  0.30 m                             Systemic Diam: 1.70 cm Mertie Moores MD Electronically signed by Mertie Moores MD Signature Date/Time: 01/15/2020/10:40:06 AM    Final    US LIVER DOPPLER  Result Date: 01/15/2020 CLINICAL DATA:  59 year old female with history of alcoholic cirrhosis. Evaluate for portal vein patency prior to planned transjugular intrahepatic portosystemic shunt. EXAM: DUPLEX ULTRASOUND OF LIVER TECHNIQUE: Color and duplex Doppler ultrasound was performed to evaluate the hepatic in-flow and out-flow vessels. COMPARISON:  11/12/2019 FINDINGS: Liver: Coarsened hepatic echotexture.  Nodular surface contour. No focal lesion, mass or intrahepatic biliary ductal dilatation. Main Portal Vein size: 1.6 cm Portal Vein Velocities Main Prox:  23 cm/sec Main Mid: 24 cm/sec Main Dist:  23 cm/sec Right: 16 cm/sec Left: 17 cm/sec Hepatic  Vein Velocities Right:  73 cm/sec Middle:  24 cm/sec Left:  23 cm/sec IVC: Present and patent with normal respiratory phasicity. Hepatic Artery Velocity:  142 cm/sec Splenic Vein Velocity:  39 cm/sec Spleen: 10.1 cm x 14.2 cm x 6.2 cm with a total volume of 469 cm^3 (411 cm^3 is upper limit normal) Portal Vein Occlusion/Thrombus: No Splenic Vein Occlusion/Thrombus: No Ascites: Large volume bilateral upper quadrant ascites. Varices: None IMPRESSION: 1. Patent portal system with antegrade flow. 2. Similar appearing cirrhotic morphology with secondary signs of portal hypertension including large volume ascites and splenomegaly. No hepatoma appreciated. Electronically Signed   By: Ruthann Cancer MD   On: 01/15/2020 08:41   IR Paracentesis  Result Date:  01/14/2020 CLINICAL DATA:  59 year old female with a history of cryptogenic cirrhosis, presents for TIPS EXAM: TRANSJUGULAR INTRAHEPATIC PORTOSYSTEMIC SHUNT; IR PARACENTESIS MEDICATIONS: Patient is on broad-spectrum antibiotics in the hospital ANESTHESIA/SEDATION: General - as administered by the Anesthesia department CONTRAST:  116m OMNIPAQUE IOHEXOL 300 MG/ML SOLN, 129mOMNIPAQUE IOHEXOL 300 MG/ML SOLN FLUOROSCOPY TIME:  Fluoroscopy Time: 30 minutes 42 seconds (412 mGy). COMPLICATIONS: None PROCEDURE: Informed written consent was obtained from the patient's family after a thorough discussion of the procedural risks, benefits and alternatives. Specific risks discussed with TIPS/variceal embolization included: Bleeding, infection, vascular injury, need for further procedure/surgery, renal injury/renal failure, contrast reaction, non-target embolization, liver dysfunction/failure, hepatic encephalopathy, stroke (~1%), cardiopulmonary collapse, death. All questions were addressed. Maximal Sterile Barrier Technique was utilized including caps, mask, sterile gowns, sterile gloves, sterile drape, hand hygiene and skin antiseptic. A timeout was performed prior to the  initiation of the procedure. Patient was positioned supine position on the table, with the right upper abdomen and the right neck prepped and draped in the usual sterile fashion. Ultrasound survey of the right upper quadrant was performed, with images stored and sent to PACs. Small incision was made on the right anterior abdominal wall, at a suitable site for paracentesis. A Safe-T-Centesis catheter was introduced, with confirmation of location with aspiration of ascitic fluid. Large volume paracentesis was then performed. Using ultrasound guidance, Chiba needle was used access the right portal system. Once we confirmed position in the portal system with portal venography, micro wire was advanced into the inferior mesenteric vein. The tri-axial system of an Envy system was advanced into the portal vein. Venogram confirmed are location within the portal system. The micro wire was then replaced into the inner dilator, placed into the splenic vein. Check flow vial was placed on the back and of the inner dilator. Ultrasound survey was then performed of the right neck, with images stored and sent to PACs. Ultrasound guidance was then used to access the right internal jugular vein with a micro puncture kit. The wire was advanced under fluoroscopy into the right atrium, and a small incision was made. The needle was removed and the dilator was placed. The micro wire in the stiffener were removed and an 035 wire was then passed into the inferior vena cava. Ten French soft tissue dilation was performed on the wire, and then 10 Fr[place]IPS sheath was placed. Portal venous gradient of approximately 16 was then measured. We then placed a Bentson wire into the splenic vein through the percutaneous transhepatic portal access. The 4 French outer diameter catheter was removed and a 35 cm bright tip 6 French sheath was placed into the portal system. We then removed the Bentson wire on a standard Kumpe the catheter and placed a 180 cm  SV 8 microwire as a safety wire. We then advanced in parallel configuration adjacent to the safety wire a 10 mm snare, to the a paths of the portal system and opened this narrows a target. A safety wire was left within the hepatic vein where we measured the hepatic pressures, and the tip sheath was withdrawn to the confluence of the IVC and the draining a Paddock vein. Hepatic venogram was then performed to confirm location. A parallel 25 mm snare was then advanced through the tips sheath and positioned at the confluence of the hepatic vein and IVC. The image intensifier was then positioned into a steep right anterior oblique and craniocaudal direction to align the 10 mm snare over the 25 mm  snare. We then used fluoroscopic guidance to guide a Chiba needle through both of the snare, confirmed in multiple obliquity with fluoroscopic imaging. We then passed an 014 whisper wire through the [place] needle, and up the IVC towards the right heart. At this point we confirmed that the wire was through both of the snare by gently withdrawing first a 25 mm narrowing tightening around the wire, and then tightening the 10 mm snare around the wire. The 25 mm snare was then used to grasp the wire and withdrawal from the tips sheath, externalized at the IJ puncture site. Given that the wire was deformed from withdrawn from the snare and bent, a wire snip for was required to cut at this deformed section of the wire. An 018 coyote balloon was then advanced through the tips sheath, 4 mm x 150 mm, and used to balloon the parenchymal tract to the portal system and through the snare within the portal system. A coaxial CXI 035 catheter and CXI 014 catheter were then advanced over the transjugular/transhepatic wire. Once the 035 catheter was confirmed to be through the snare in the portal system, the inner 014 catheter was removed. Check flow valve was then placed over the 014 wire on the back of the 035 CXI catheter, and angiogram  confirmed location in the portal system. At this point the 014 safety wire was abandoned leaving the tip of the 035 catheter in the portal system. A stiff Glidewire was first attempted to navigate into the portal vein directed towards the splenic vein, which was unsuccessful. We then exchanged the straight 035 CXI crossing catheter on a rose in wire, for a standard 65 cm Kumpe the catheter. This was successful in navigating the Diagnostic wire into the SMV. The Kumpe the catheter was advanced to the SMV and the wire was removed. Angiogram confirmed location within the portal vein. The rosen wire was then navigated into the SMV for more stable distal position. The tips sheath was attempted to pass over the Rose an wire into the portal system using simply the introducer. This failed given the resistance. 6 mm x 40 mm balloon angioplasty was then performed along the tract. Balloon was withdrawn and another attempt at passing the sheath was performed, failing with the resistance. Diagnostic catheter was then passed in the portal system in the [facility] an wire was exchanged for an Amplatz wire. Again the catheter did not pass through the parenchymal tract, and 8 mm x 40 mm balloon angioplasty was then performed. This identified the interface at the portal system and parenchyma and the IVC and the parenchyma, measuring less than 4 cm in distance. Upon deflating the 8 mm balloon, the tips sheath was passed into the portal system. Angiogram confirmed location. We elected to place a 2 by 10 by 60 via tore covered stent graft, which was deployed through the parenchymal tract. Post dilation was performed 10 mm. Repeat angiogram was performed, confirming excellent antegrade flow through the shunt, and no refluxing contrast on the injection. The 6 French sheath was then withdrawn, injecting contrast during the withdrawal, identifying the parenchymal tract. The parenchymal tract was then coil embolized with a 6 mm x 9.2 mm 035 Nester  diamond-shaped coil. Sheath was removed. The IJ sheath was removed with manual pressure performed for hemostasis. Patient remained hemodynamically stable throughout. No complications were encountered and no significant blood loss. The paracentesis yielded 3.3 L. IMPRESSION: Status post direct intrahepatic portosystemic shunt (DIPS), trans caval, via "gunsight" technique utilizing  a percutaneous hepatic access to the portal system, for treatment of refractory ascites/hepatorenal syndrome. The percutaneous access was coil embolized upon withdrawal of the sheath. Large volume paracentesis. Signed, Dulcy Fanny. Dellia Nims, RPVI Vascular [facility] Radiology Electronically Signed   By: Corrie Mckusick D.O.   On: 01/14/2020 17:30   IR Paracentesis  Result Date: 01/13/2020 INDICATION: Patient with a history of cirrhosis and recurrent large volume ascites presents today for a therapeutic and diagnostic paracentesis. EXAM: ULTRASOUND GUIDED PARACENTESIS MEDICATIONS: 1% lidocaine 10 mL COMPLICATIONS: None immediate. PROCEDURE: Informed written consent was obtained from the patient after a discussion of the risks, benefits and alternatives to treatment. A timeout was performed prior to the initiation of the procedure. Initial ultrasound scanning demonstrates a large amount of ascites within the right lower abdominal quadrant. The right lower abdomen was prepped and draped in the usual sterile fashion. 1% lidocaine was used for local anesthesia. Following this, a 19 gauge, 7-cm, Yueh catheter was introduced. An ultrasound image was saved for documentation purposes. The paracentesis was performed. The catheter was removed and a dressing was applied. The patient tolerated the procedure well without immediate post procedural complication. FINDINGS: A total of approximately 4.8 L of cloudy pain fluid was removed. Samples were sent to the laboratory as requested by the clinical team.  IMPRESSION: Successful ultrasound-guided paracentesis yielding 4.8 liters of peritoneal fluid. Read by: Soyla Dryer, NP Electronically Signed   By: Corrie Mckusick D.O.   On: 01/13/2020 17:08    Labs:  CBC: Recent Labs    01/13/20 0139 01/13/20 0139 01/14/20 0431 01/14/20 0431 01/14/20 1109 01/14/20 1243 01/15/20 0406 01/16/20 1015  WBC 5.0  --  4.2  --   --   --  5.1 9.2  HGB 7.7*   < > 7.5*   < > 7.5* 9.2* 9.5* 9.9*  HCT 23.5*   < > 23.1*   < > 22.0* 27.0* 28.5* 29.7*  PLT 87*  --  84*  --   --   --  94* 100*   < > = values in this interval not displayed.    COAGS: Recent Labs    11/25/19 0630 12/20/19 0624 01/07/20 1932 01/10/20 0434 01/10/20 0717 01/14/20 0431  INR 1.5*   < > 1.8* 1.6* 1.4* 1.6*  APTT 35  --   --   --   --   --    < > = values in this interval not displayed.    BMP: Recent Labs    01/13/20 0139 01/13/20 0139 01/14/20 0431 01/14/20 0431 01/14/20 1109 01/14/20 1243 01/15/20 0406 01/16/20 0459  NA 132*   < > 132*   < > 137 136 130* 129*  K 4.5   < > 4.8   < > 4.4 5.1 5.1 4.2  CL 109  --  111  --   --   --  104 102  CO2 15*  --  14*  --   --   --  19* 19*  GLUCOSE 117*  --  112*  --   --   --  261* 252*  BUN 35*  --  40*  --   --   --  43* 41*  CALCIUM 8.4*  --  8.4*  --   --   --  8.3* 8.4*  CREATININE 1.40*  --  1.38*  --   --   --  1.38* 1.35*  GFRNONAA 42*  --  42*  --   --   --  42* 43*  GFRAA 48*  --  49*  --   --   --  49* 50*   < > = values in this interval not displayed.    LIVER FUNCTION TESTS: Recent Labs    01/12/20 0128 01/13/20 0139 01/14/20 0431 01/15/20 0406  BILITOT 1.9* 1.7* 2.2* 2.0*  AST 47* 43* 43* 55*  ALT _0 ALKPHOS 105 110 118 93  PROT 4.9* 5.0* 5.1* 4.7*  ALBUMIN 3.2* 3.0* 2.7* 2.6*    Assessment and Plan:  NASH cirrhosis with recurrent ascites.  S/P TIPS by Dr. Earleen Newport 01/14/20.  I have placed the follow up order in Epic. She will be seen in about 4 weeks in the outpatient IR clinic  with US of the TIPS shunt.  Our office will call patient with appointment instructions.  Agree with Lactulose to avoid encephalopathy secondary to TIPS shunt.  Electronically Signed: Murrell Redden, PA-C 01/16/2020, 2:05 PM    I spent a total of 15 Minutes at the the patient's bedside AND on the patient's hospital floor or unit, greater than 50% of which was counseling/coordinating care for TIPS follow up.

## 2020-01-16 NOTE — Transfer of Care (Signed)
Immediate Anesthesia Transfer of Care Note  Patient: Meagan Peck  Procedure(s) Performed: ESOPHAGOGASTRODUODENOSCOPY (EGD) WITH PROPOFOL (N/A ) HOT HEMOSTASIS (ARGON PLASMA COAGULATION/BICAP) (N/A )  Patient Location: Endoscopy Unit  Anesthesia Type:MAC  Level of Consciousness: sedated  Airway & Oxygen Therapy: Patient connected to nasal cannula oxygen  Post-op Assessment: Post -op Vital signs reviewed and stable  Post vital signs: stable  Last Vitals:  Vitals Value Taken Time  BP    Temp    Pulse    Resp    SpO2      Last Pain:  Vitals:   01/16/20 0732  TempSrc: Oral  PainSc: 2       Patients Stated Pain Goal: 2 (52/48/18 5909)  Complications: No complications documented.

## 2020-01-16 NOTE — Progress Notes (Signed)
Initial Nutrition Assessment  DOCUMENTATION CODES:   Not applicable  INTERVENTION:   -Ensure Enlive po BID, each supplement provides 350 kcal and 20 grams of protein -MVI with minerals daily  NUTRITION DIAGNOSIS:   Increased nutrient needs related to chronic illness (cirrhosis) as evidenced by estimated needs.  GOAL:   Patient will meet greater than or equal to 90% of their needs  MONITOR:   PO intake, Supplement acceptance, Labs, Weight trends, Skin, I & O's  REASON FOR ASSESSMENT:   Malnutrition Screening Tool    ASSESSMENT:   59 year old female who has a significant past medical history of decompensated cirrhosis that was diagnosed in March of this year with refractory ascites.  Her recent past medical history is significant for hyperkalemia with diuretic therapy for her ascites and subsequent need for weekly paracentesis.  Pt admitted with SBP in the setting of decompensated cirrhosis suspected due to NASH.   9/10- s/p RLQ paracentesis (1 L clear amber fluid removed) 9/13- s/p rt paracentesis (4.8 L cloudy pink fluid removed) 9/14- s/p US guided percutaneous portal vein access; US guided right IJ access; "gunsight TIPS" with percutaneous 21g chiba, connecting IVC to the main portal via 35m x 166mviatorr; Coil embolization of percutaneous portal vein access.  Reviewed I/O's: -30 ml x 24 hours ans -2.4 L since admission  UOP: 750 ml x 24 hours  Pt underwent EGD to evaluate for esophageal varices today.   Spoke with pt at bedside, who just returned from EGD. Pt was sleepy, but able to participate in interview. She shares that she has a great appetite and has been consuming all of her meals (documented meal completion 100%; breakfast tray on tray table 100% consumed). Pt reports that PTA her appetite is good- consumes 2-3 meals per day and snacks in between (Breakfast: grits, eggs, and toast; Lunch and Dinner: meat, starch, and vegetable).   Pt suspects she has lost  weight, but unsure of how much she has lost or UBW. Pt expresses that is is difficult to assess weight changes due to her ascites and suspects that this is contributing to her weight loss. Reviewed wt hx; pt has experienced a 18% wt loss over the past 6 months, which is significant for time frame. She reports feeling weak, but making slow progress with therapies. She uses a cane at baseline and her husband helps her when climbing stairs.   Discussed with pt importance of good meal and supplement intake to promote healing. Pt amenable to Ensure Enlive supplements.  Medications reviewed and include lactated ringers infusion @ 20 ml/hr.  Labs reviewed: Na: 129. CBGS: 133.   NUTRITION - FOCUSED PHYSICAL EXAM:    Most Recent Value  Orbital Region No depletion  Upper Arm Region Moderate depletion  Thoracic and Lumbar Region No depletion  Buccal Region No depletion  Temple Region No depletion  Clavicle Bone Region No depletion  Clavicle and Acromion Bone Region No depletion  Scapular Bone Region No depletion  Dorsal Hand Moderate depletion  Patellar Region No depletion  Anterior Thigh Region No depletion  Posterior Calf Region No depletion  Edema (RD Assessment) Mild  Hair Reviewed  Eyes Reviewed  Mouth Reviewed  Skin Reviewed  Nails Reviewed       Diet Order:   Diet Order            Diet 2 gram sodium Room service appropriate? Yes; Fluid consistency: Thin  Diet effective now  EDUCATION NEEDS:   Education needs have been addressed  Skin:  Skin Assessment: Skin Integrity Issues: Skin Integrity Issues:: Incisions Incisions: rt neck, rt flank, abdomen  Last BM:  01/13/20  Height:   Ht Readings from Last 1 Encounters:  12/20/19 4' 10"  (1.473 m)    Weight:   Wt Readings from Last 1 Encounters:  01/15/20 64.3 kg    Ideal Body Weight:  44.1 kg  BMI:  Body mass index is 29.63 kg/m.  Estimated Nutritional Needs:   Kcal:  1550-1750  Protein:   85-100 grams  Fluid:  > 1.5 L    Loistine Chance, RD, LDN, [place] Registered Dietitian II Certified Diabetes Care and Education Specialist Please refer to [facility] for RD and/or RD on-call/weekend/after hours pager

## 2020-01-16 NOTE — Anesthesia Postprocedure Evaluation (Signed)
Anesthesia Post Note  Patient: Meagan Peck  Procedure(s) Performed: ESOPHAGOGASTRODUODENOSCOPY (EGD) WITH PROPOFOL (N/A ) HOT HEMOSTASIS (ARGON PLASMA COAGULATION/BICAP) (N/A )     Patient location during evaluation: Endoscopy Anesthesia Type: MAC Level of consciousness: awake and alert, patient cooperative and oriented Pain management: pain level controlled Vital Signs Assessment: post-procedure vital signs reviewed and stable Respiratory status: spontaneous breathing, respiratory function stable, nonlabored ventilation and patient connected to nasal cannula oxygen Cardiovascular status: blood pressure returned to baseline and stable Postop Assessment: no apparent nausea or vomiting Anesthetic complications: no   No complications documented.  Last Vitals:  Vitals:   01/16/20 0920 01/16/20 0945  BP: (!) 93/53 (!) 96/55  Pulse: 72 70  Resp: (!) 25 18  Temp:  36.6 C  SpO2: 99% 100%    Last Pain:  Vitals:   01/16/20 0953  TempSrc:   PainSc: 6                  Elica Almas,E. Elo [place]armolejos

## 2020-01-16 NOTE — TOC Initial Note (Signed)
Transition of Care [facility]) - Initial/Assessment Note    Patient Details  Name: Meagan Peck MRN: [id number] Date of Birth: 1961/05/01  Transition of Care [facility]) CM/SW Contact:    Marilu Favre, RN Phone Number: 01/16/2020, 1:12 PM  Clinical Narrative:                 Patient from home with spouse.   Discussed PT recommendations for HHPT. Provided chose. Patient voiced understanding , however declining home health services at this time. Explained if she changes her mind after discharge , PCP office can arrange.   Expected Discharge Plan: Home/Self Care Barriers to Discharge: Continued Medical Work up   Patient Goals and CMS Choice Patient states their goals for this hospitalization and ongoing recovery are:: to return to home CMS Medicare.gov Compare Post Acute Care list provided to:: Patient Choice offered to / list presented to : Patient  Expected Discharge Plan and Services Expected Discharge Plan: Home/Self Care   Discharge Planning Services: CM Consult Post Acute Care Choice: [place] arrangements for the past 2 months: Single Family Home                 DME Arranged: N/A DME Agency: NA       HH Arranged: Refused HH          Prior Living Arrangements/Services Living arrangements for the past 2 months: Single Family Home Lives with:: Spouse Patient language and need for interpreter reviewed:: Yes Do you feel safe going back to the place where you live?: Yes      Need for Family Participation in Patient Care: Yes (Comment) Care giver support system in place?: Yes (comment)   Criminal Activity/Legal Involvement Pertinent to Current Situation/Hospitalization: No - Comment as needed  Activities of Daily Living Home Assistive Devices/Equipment: Eyeglasses ADL Screening (condition at time of admission) [name] cognitive ability adequate to safely complete daily activities?: Yes Is the patient deaf or have difficulty hearing?: No Does the patient  have difficulty seeing, even when wearing glasses/contacts?: No Does the patient have difficulty concentrating, remembering, or making decisions?: No Patient able to express need for assistance with ADLs?: Yes Does the patient have difficulty dressing or bathing?: No Independently performs ADLs?: Yes (appropriate for developmental age) Does the patient have difficulty walking or climbing stairs?: Yes Weakness of Legs: Both Weakness of Arms/Hands: None  Permission Sought/Granted   Permission granted to share information with : No              Emotional Assessment Appearance:: Appears stated age Attitude/Demeanor/Rapport: Engaged Affect (typically observed): Accepting Orientation: : Oriented to Self, Oriented to Place, Oriented to  Time, Oriented to Situation Alcohol / Substance Use: Not Applicable Psych Involvement: No (comment)  Admission diagnosis:  Spontaneous bacterial peritonitis ([place]) [K65.2] SBP (spontaneous bacterial peritonitis) ([place]) [K65.2] Severe sepsis ([place]) [A41.9, R65.20] Patient Active Problem List   Diagnosis Date Noted  . Portal hypertension ([place])   . Angiodysplasia of upper gastrointestinal tract   . SBP (spontaneous bacterial peritonitis) ([place]) 01/08/2020  . History of portal hypertension 11/25/2019  . Esophageal varices without bleeding ([place])   . Hypertension associated with diabetes ([place]) 08/16/2019  . Cirrhosis ([place]) 08/16/2019  . Vitamin D deficiency 05/15/2017  . Iron deficiency 05/15/2017  . Breast screening declined 05/15/2017  . Colonoscopy refused 05/15/2017  . Choroidal nevus of both eyes 07/18/2016  . Corneal epithelial and basement membrane dystrophy 07/18/2016  . Thrombocytopenia ([place]) 04/14/2016  . Hyperlipidemia associated with type 2 diabetes  mellitus ([place]) 05/25/2015  . Gastroesophageal reflux disease 05/25/2015  . Type 2 diabetes mellitus with other specified complication ([place]) 37/34/2876   PCP:  [name], DO Pharmacy:    [facility] [address] [place] [address] [place] [address] Phone: [phone] Fax: [phone]     Social Determinants of Health (SDOH) Interventions    Readmission Risk Interventions No flowsheet data found.

## 2020-01-16 NOTE — Progress Notes (Signed)
Subjective:   Patient had EGD this morning and tolerated the procedure well. Patient complaining of continued headache for which she states she received Tylenol. She says that she continues to have abdominal pain that is localized to her hernia site, without other abdominal pain that has slightly improved since yesterday. She states she hasn't had a BM since 07/15/22 and has passed less gas today. She does note some light-headedness, worse with activity, although she notes she was able to walk to the bathroom with PT yesterday without issue. Denies back pain, pain over procedure site for TIPS, SOB at rest, fever, chills, worsening abdominal distention, swelling elsewhere, or any other symptoms.  Patient has a few steps going up to her house with rails and states she has not yet tried stairs with PT.  Objective:  Vital signs in last 24 hours: Vitals:   01/16/20 0920 01/16/20 0945 01/16/20 1439 01/16/20 1441  BP: (!) 93/53 (!) 96/55 (!) 89/57 (!) 95/57  Pulse: 72 70 80 79  Resp: (!) 25 18 18    Temp:  97.8 F (36.6 C) 97.6 F (36.4 C)   TempSrc:  Oral    SpO2: 99% 100% 100%   Weight:       General: Patient appears chronically ill. Uncomfortable due to headache but NAD. Respiratory: Lung sounds decreased bilaterally but otherwise CTA without wheezing or rhonchi. Cardiovascular: Regular rate and rhythm. No murmurs, rubs, or gallops. 1+ lower extremity pitting edema present, bilaterally.  Abdominal: Moderate abdominal distention stable since yesterday; however, there is significant tenderness over umbilical hernia site and lower abdomen with guarding. Minimal erythema overlying hernia site. hernia is reducible. no rebound. Bowel sounds normal. Musculoskeletal: Extremities appear cachectic. Skin: Skin appears mildly jaundiced. There are purpura along the forearms without active bleeding of the skin. 2x island dressings and 1 bandaid in place over R flank at location of TIPS procedure. No  surrounding erythema or edema. No rashes or other lesions noted. Psych: Normal affect. Normal tone of voice.   Assessment/Plan:  Principal Problem:   SBP (spontaneous bacterial peritonitis) ([place]) Active Problems:   Thrombocytopenia ([place])   Type 2 diabetes mellitus with other specified complication ([place])   Cirrhosis ([place])   Portal hypertension ([place])   Angiodysplasia of upper gastrointestinal tract  # Refractory, Decompensated Cirrhosis s/p TIPS 9/14 2/2 likely NAFLD, Complicated by Active SBP, recurrent large volume ascites,  esophageal varices s/p banding, and thrombocytopenia  Patient was first diagnosed with cirrhosis around March of this year and receives weekly paracentesis. Paracentesis fluid from 01/07/20 grew E. Coli. Thrombocytopenia, hyperalbuminemia, hyperbilirubinemia, and increase in PT/INR stable. No active signs of bleeding. Diagnostic paracentesis on 9/13 with negative Gram stain, negative path review, 68 total nucleated cells (improved). S/p TIPS procedure on 9/14. EGD 01/16/20 showed Grade I, small varices with no stigmata of bleeding. No banding procedure performed. Two angioectasias without bleeding in the gastric fundus and body were ablated. - GI consulted and IR on board, appreciate their recommendations - Will continue cefazolin (Day 9/14 ABX) - Will need secondary prophylaxis with ciprofloxacin 529m daily to prevent recurrent SBP - will hold off on albumin/fluids for now  - Will start Lactulose 20 gm three times daily with goal of 2-3 soft bowel movements daily - If unable to tolerate Lactulose, will start rifaximine 5550mtwice daily, per GI recs - conservative management only for umbilical hernia given patient is poor surgical candidate   # Improving Normocytic Anemia Hemoglobin continues to improve. Iron panel shows low iron 27,  normal ferritin 121, and saturation ratios unable to be calculated. No active bleeding. - Continue to monitor daily CBC's - Will hold on  iron replacement therapy given active infection; consider therapy vs. repeat saturation ratios once SBP resolves  # AKI, Resolved # Asymptomatic Bacteriuria # Non-anion gap Metabolic Acidosis Creatinine initially 1.72 with baseline ~ 1.37. BUN 41, GFR 37. Bicarb remains low. Suspect more recent CKD stage IIIa; Creatinine stable. 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 U/A hazy with large leukocytes and rare bacteria, >50 WBC, but no urinary symptoms or CVA tenderness concerning for treatable infection. U/A showed moderate hemoglobinuria but no RBC's. CK WNL. - Continue to monitor creatinine   # Diet-Controlled Type II DM Patient had recently been taken off Metformin due to low Hgb A1c of 5.2. Blood glucose higher past two days 261 > 252. - Increased CBG checks to q8 hours - May initiate SSI if sugars continue to be elevated  - Patient may require outpatient medication if sugars remain elevated  # Hypotension [name] blood pressure is slightly lower today, in the high 94'I-01'K systolic. S/p albumin 100g 9/8 and 9/10.  - Will attempt to avoid fluids as much as possible given cirrhosis - not requiring paracentesis at this time - Will repeat albumin with CMP tomorrow - Continue to monitor closely  # Recurrent Constipation Patient says she has not had a bowel movement in 4 days. Previously improved on Senokot; however, given recent TIPS and insomnia, may prevent encephalopathy with lactulose. - Will start lactulose and discontinue Senokot   # Hyperkalemia, Resolved Potassium 5.1 --> 4.2; may be secondary to RTA type IV - Continue to monitor  Barriers to Discharge: Recent TIPS procedure / constipation  Jeralyn Bennett, MD 01/16/2020, 4:21 PM Pager: [phone] After 5pm on weekdays and 1pm on weekends: On Call pager [phone]

## 2020-01-16 NOTE — Interval H&P Note (Signed)
History and Physical Interval Note: For EGD to eval esophageal varices.  Recent TIPS.  Possible banding. HIGHER THAN BASELINE RISK.The nature of the procedure, as well as the risks, benefits, and alternatives were carefully and thoroughly reviewed with the patient. Ample time for discussion and questions allowed. The patient understood, was satisfied, and agreed to proceed.   CBC    Component Value Date/Time   WBC 5.1 01/15/2020 0406   RBC 3.18 (L) 01/15/2020 0406   HGB 9.5 (L) 01/15/2020 0406   HCT 28.5 (L) 01/15/2020 0406   PLT 94 (L) 01/15/2020 0406   MCV 89.6 01/15/2020 0406   MCH 29.9 01/15/2020 0406   MCHC 33.3 01/15/2020 0406   RDW 15.4 01/15/2020 0406   LYMPHSABS 0.7 01/07/2020 1932   MONOABS 0.8 01/07/2020 1932   EOSABS 0.0 01/07/2020 1932   BASOSABS 0.0 01/07/2020 1932   Lab Results  Component Value Date   INR 1.6 (H) 01/14/2020   INR 1.4 (H) 01/10/2020   INR 1.6 (H) 01/10/2020   CMP     Component Value Date/Time   NA 129 (L) 01/16/2020 0459   K 4.2 01/16/2020 0459   CL 102 01/16/2020 0459   CO2 19 (L) 01/16/2020 0459   GLUCOSE 252 (H) 01/16/2020 0459   BUN 41 (H) 01/16/2020 0459   CREATININE 1.35 (H) 01/16/2020 0459   CREATININE 0.72 07/22/2019 1130   CALCIUM 8.4 (L) 01/16/2020 0459   PROT 4.7 (L) 01/15/2020 0406   ALBUMIN 2.6 (L) 01/15/2020 0406   AST 55 (H) 01/15/2020 0406   ALT 26 01/15/2020 0406   ALKPHOS 93 01/15/2020 0406   BILITOT 2.0 (H) 01/15/2020 0406   GFRNONAA 43 (L) 01/16/2020 0459   GFRNONAA 94 07/22/2019 1130   GFRAA 50 (L) 01/16/2020 0459   GFRAA 108 07/22/2019 1130     01/16/2020 8:34 AM  [name]  has presented today for surgery, with the diagnosis of hx banding of non-bleeding esoph varices, needs surveillance.  The various methods of treatment have been discussed with the patient and family. After consideration of risks, benefits and other options for treatment, the patient has consented to   Procedure(s): ESOPHAGOGASTRODUODENOSCOPY (EGD) WITH PROPOFOL (N/A) as a surgical intervention.  The patient's history has been reviewed, patient examined, no change in status, stable for surgery.  I have reviewed the patient's chart and labs.  Questions were answered to the patient's satisfaction.     Meagan Peck

## 2020-01-16 NOTE — Anesthesia Preprocedure Evaluation (Addendum)
Anesthesia Evaluation  Patient identified by MRN, date of birth, ID band Patient awake    Reviewed: Allergy & Precautions, NPO status , [name] Chart, lab work & pertinent test results  History of Anesthesia Complications Negative for: history of anesthetic complications  Airway Mallampati: II  TM Distance: >3 FB Neck ROM: Full    Dental  (+) Missing, Dental Advisory Given   Pulmonary shortness of breath,  01/08/2020 SARS coronavirus NEG   breath sounds clear to auscultation       Cardiovascular hypertension (no longer requires BP meds), (-) angina Rhythm:Regular Rate:Normal  01/15/2020 ECHO: EF 60-65%, mild MR, mild TR   Neuro/Psych negative neurological ROS     GI/Hepatic GERD  Controlled,(+) Cirrhosis   Esophageal Varices and ascites    ,   Endo/Other  diabetes (glu 252)  Renal/GU Renal InsufficiencyRenal disease (creat 1.35)     Musculoskeletal   Abdominal   Peds  Hematology  (+) Blood dyscrasia (Hb 9.5), anemia , INR 1.6   Anesthesia Other Findings   Reproductive/Obstetrics                            Anesthesia Physical Anesthesia Plan  ASA: IV  Anesthesia Plan: MAC   Post-op Pain Management:    Induction:   PONV Risk Score and Plan: 2 and Treatment may vary due to age or medical condition  Airway Management Planned: Natural Airway and Nasal Cannula  Additional Equipment: None  Intra-op Plan:   Post-operative Plan:   Informed Consent: I have reviewed the patients History and Physical, chart, labs and discussed the procedure including the risks, benefits and alternatives for the proposed anesthesia with the patient or authorized representative who has indicated his/her understanding and acceptance.     Dental advisory given  Plan Discussed with: CRNA and Surgeon  Anesthesia Plan Comments:        Anesthesia Quick Evaluation

## 2020-01-16 NOTE — Op Note (Signed)
[facility] Patient Name: Meagan Peck Procedure Date : 01/16/2020 MRN: [id number] Attending MD: Jerene Bears , MD Date of Birth: 20-Mar-1961 CSN: [id number] Age: 59 Admit Type: Inpatient Procedure:                Upper GI endoscopy Indications:              Esophageal varices in setting of decompensated                            cirrhosis with portal hypertension, history of                            variceal banding last in May 2021, recent TIPS                            placement (48 hours ago) Providers:                Lajuan Lines. Hilarie Fredrickson, MD, Benetta Spar RN, RN, Ladona Ridgel, Technician, Theodoro Grist, CRNA Referring MD:             [facility] Medicines:                Monitored Anesthesia Care Complications:            No immediate complications. Estimated Blood Loss:     Estimated blood loss: none. Procedure:                Pre-Anesthesia Assessment:                           - Prior to the procedure, a History and Physical                            was performed, and patient medications and                            allergies were reviewed. The patient's tolerance of                            previous anesthesia was also reviewed. The risks                            and benefits of the procedure and the sedation                            options and risks were discussed with the patient.                            All questions were answered, and informed consent                            was obtained. Prior Anticoagulants: The patient has  taken no previous anticoagulant or antiplatelet                            agents. ASA Grade Assessment: III - A patient with                            severe systemic disease. After reviewing the risks                            and benefits, the patient was deemed in                            satisfactory condition to undergo the procedure.                            After obtaining informed consent, the endoscope was                            passed under direct vision. Throughout the                            procedure, the patient's blood pressure, pulse, and                            oxygen saturations were monitored continuously. The                            [id number]-[id number] ([id number] Olympus gastroscope was                            introduced through the mouth, and advanced to the                            second part of duodenum. The upper GI endoscopy was                            accomplished without difficulty. The patient                            tolerated the procedure well. Scope In: Scope Out: Findings:      Grade I, small (< 5 mm) varices were found in the lower third of the       esophagus. The varices had no stigmata of bleeding. There is scarring       evidence from prior banding just above the GEJ. The varices flatten       completely with insufflation. Given recent TIPS placement and very small       nature of the varices additional banding was not performed today.      Two small angioectasias with no bleeding were found in the gastric       fundus and in the gastric body. Fulguration to ablate the lesion to       prevent bleeding by argon plasma at 1 liter/minute and 20 watts was       successful.      The exam  of the stomach was otherwise normal.      The examined duodenum was normal. Impression:               - Grade I and small (< 5 mm) esophageal varices.                           - Two non-bleeding angioectasias in the stomach.                            Treated with argon plasma coagulation (APC).                           - No evidence of gastric varices.                           - Normal examined duodenum.                           - No specimens collected. Moderate Sedation:      N/A Recommendation:           - Return patient to hospital ward for ongoing care.                           - Resume  previous diet.                           - Continue present medications. Procedure Code(s):        --- Professional ---                           [phone], Esophagogastroduodenoscopy, flexible,                            transoral; with control of bleeding, any method Diagnosis Code(s):        --- Professional ---                           I85.00, Esophageal varices without bleeding                           K31.819, Angiodysplasia of stomach and duodenum                            without bleeding CPT copyright 2019 American Medical Association. All rights reserved. The codes documented in this report are preliminary and upon coder review may  be revised to meet current compliance requirements. Jerene Bears, MD 01/16/2020 9:13:53 AM This report has been signed electronically. Number of Addenda: 0

## 2020-01-16 NOTE — Plan of Care (Signed)

## 2020-01-16 NOTE — Evaluation (Signed)
Occupational Therapy Evaluation Patient Details Name: Meagan Peck MRN: [id number] DOB: December 14, 1960 Today's Date: 01/16/2020    History of Present Illness 59 year old female with a history of diabetes, hypertension, and recently diagnosed cirrhosis with weekly paracentesis presenting with abdominal pain and a recent paracentesis that showed findings concerning for SBP.  Patient was contacted by her GI doctor and advised to come into the ED for further work-up and management of the SBP. Patient reports that since Monday, 01/06/2020, she has been having some feelings of shakiness and cold, right-sided abdominal pain, mildly decreased appetite, and abdominal bloating.  Also endorses some headaches. Pt underwent US guided paracentesis from RLQ on 9/10 and 9/13. Pt then underwent TIPs procedure on 9/14.   Clinical Impression   Patient admitted with the above diagnosis.  Barriers listed below currently limit self care, toilet skills and functional mobility in the acute setting compared to her stated prior level of function.  Patient did require an AD for mobility, occasional assist to negotiate stairs and tub transfers.  She was able to bathe and dress herself without assist, and required no assist with toileting.  Currently she needs Mod A for self care, Min A with mobility to the toilet and complains of being light headed with sit to stand.  Patient declines all post acute rehab.  She has the assist needed at home to care for herself.  No further needs in the acute setting. OT recommends up with staff throughout the day, ambulating to the bathroom with staff and seated ADL as tolerated with staff. Patient is open to hip kit usage at home and using a shower seat for safety.      Follow Up Recommendations  No OT follow up;Other (comment) (Patient declines [place] services.)    Equipment Recommendations  Tub/shower seat;Other (comment) (LH sponge and LH reacher.)    Recommendations for Other Services        Precautions / Restrictions Precautions Precautions: Fall Restrictions Weight Bearing Restrictions: No      Mobility Bed Mobility Overal bed mobility: Needs Assistance Bed Mobility: Supine to Sit;Sit to Supine     Supine to sit: Min assist;HOB elevated Sit to supine: Min assist;HOB elevated      Transfers                      Balance   Sitting-balance support: No upper extremity supported;Feet supported Sitting balance-Leahy Scale: Good                                     ADL either performed or assessed with clinical judgement   ADL Overall ADL's : Needs assistance/impaired     Grooming: Wash/dry hands;Wash/dry face;Set up;Sitting   Upper Body Bathing: Minimal assistance;Sitting   Lower Body Bathing: Moderate assistance;Bed level       Lower Body Dressing: Moderate assistance;Bed level Lower Body Dressing Details (indicate cue type and reason): Able to long sit to reach feet; not comfortable bending forward to reach feet seated. Toilet Transfer: Minimal assistance;Ambulation Toilet Transfer Details (indicate cue type and reason): cane and HHA                 Vision Baseline Vision/History: No visual deficits Patient Visual Report: No change from baseline Vision Assessment?: No apparent visual deficits     Perception     Praxis      Pertinent Vitals/Pain Faces Pain Scale: Hurts  little more Pain Location: Abdomen and her neck where the bandage is. Pain Descriptors / Indicators: Grimacing Pain Intervention(s): Limited activity within patient's tolerance;Monitored during session     Hand Dominance Right   Extremity/Trunk Assessment Upper Extremity Assessment Upper Extremity Assessment: Overall WFL for tasks assessed   Lower Extremity Assessment Lower Extremity Assessment: Defer to PT evaluation   Cervical / Trunk Assessment Cervical / Trunk Assessment: Normal   Communication Communication Communication: No  difficulties   Cognition Arousal/Alertness: Awake/alert Behavior During Therapy: WFL for tasks assessed/performed Overall Cognitive Status: Within Functional Limits for tasks assessed                                                Shoulder Instructions      Home Living Family/patient expects to be discharged to:: Private residence Living Arrangements: Spouse/significant other;Children Available Help at Discharge: Family;Available 24 hours/day Type of Home: House Home Access: Stairs to enter     Home Layout: One level     Bathroom Shower/Tub: Teacher, early years/pre: Standard     Home Equipment: Other (comment);Walker - 2 wheels;Shower seat          Prior Functioning/Environment    Gait / Transfers Assistance Needed: pt ambulates with use of SBQC, assist for stair negotiation.  Occasionally reaches for objects in her environment. ADL's / Homemaking Assistance Needed: Pt performs ADLs independently, intermittent assistance required for tub transfers, spouse assists with IADLs   Comments: Spouse and daughter complete majority of meal prep and home management.        OT Problem List: Decreased activity tolerance;Impaired balance (sitting and/or standing);Pain      OT Treatment/Interventions:      OT Goals(Current goals can be found in the care plan section) Acute Rehab OT Goals Patient Stated Goal: I'm fine.  I will have help at home.  I just want to move a little better. OT Goal Formulation: With patient Time For Goal Achievement: 01/27/20 Potential to Achieve Goals: Good  OT Frequency:     Barriers to D/C:            Co-evaluation              AM-PAC OT "6 Clicks" Daily Activity     Outcome Measure Help from another person eating meals?: None Help from another person taking care of personal grooming?: A Little Help from another person toileting, which includes using toliet, bedpan, or urinal?: A Little Help from another  person bathing (including washing, rinsing, drying)?: A Lot Help from another person to put on and taking off regular upper body clothing?: A Little Help from another person to put on and taking off regular lower body clothing?: A Lot 6 Click Score: 17   End of Session Nurse Communication: Other (comment) (IV leaking.)  Activity Tolerance: Patient tolerated treatment well Patient left: in bed;with call bell/phone within reach;with nursing/sitter in room  OT Visit Diagnosis: Unsteadiness on feet (R26.81);Pain;Muscle weakness (generalized) (M62.81)                Time: 5366-4403 OT Time Calculation (min): 22 min Charges:  OT General Charges $OT Visit: 1 Visit OT Evaluation $OT Eval Moderate Complexity: 1 Mod  01/16/2020  Rich, OTR/L  Acute Rehabilitation Services  Office:  [place] 01/16/2020, 2:04 PM

## 2020-01-16 NOTE — Progress Notes (Signed)
    S/p TIPS at risk for hepatic encephalopathy. Currently asymptomatic Start Lactulose 20 gm TID with goal 2-3 soft bowel movements daily  Continue antibiotics for 2 weeks for SBP and bacteremia, after completion of this course start Ciprofloxacin 582m daily for secondary prophylaxis to prevent recurrent episode of SBP. Can consider stopping if ascites completely resolves  GI will continue to follow along  K. VDenzil Magnuson, MD [phone]

## 2020-01-17 ENCOUNTER — Encounter (HOSPITAL_COMMUNITY): Payer: Self-pay | Admitting: Internal Medicine

## 2020-01-17 DIAGNOSIS — K766 Portal hypertension: Secondary | ICD-10-CM

## 2020-01-17 LAB — GLUCOSE, CAPILLARY
Glucose-Capillary: 249 mg/dL — ABNORMAL HIGH (ref 70–99)
Glucose-Capillary: 140 mg/dL — ABNORMAL HIGH (ref 70–99)
Glucose-Capillary: 189 mg/dL — ABNORMAL HIGH (ref 70–99)

## 2020-01-17 LAB — COMPREHENSIVE METABOLIC PANEL
ALT: 39 U/L (ref 0–44)
AST: 141 U/L — ABNORMAL HIGH (ref 15–41)
Albumin: 2.6 g/dL — ABNORMAL LOW (ref 3.5–5.0)
Alkaline Phosphatase: 118 U/L (ref 38–126)
Anion gap: 8 (ref 5–15)
BUN: 33 mg/dL — ABNORMAL HIGH (ref 6–20)
CO2: 18 mmol/L — ABNORMAL LOW (ref 22–32)
Calcium: 8.4 mg/dL — ABNORMAL LOW (ref 8.9–10.3)
Chloride: 105 mmol/L (ref 98–111)
Creatinine, Ser: 1.06 mg/dL — ABNORMAL HIGH (ref 0.44–1.00)
GFR calc Af Amer: >60 mL/min (ref >60)
GFR calc non Af Amer: 58 mL/min — ABNORMAL LOW (ref >60)
Glucose, Bld: 185 mg/dL — ABNORMAL HIGH (ref 70–99)
Potassium: 4.5 mmol/L (ref 3.5–5.1)
Sodium: 131 mmol/L — ABNORMAL LOW (ref 135–145)
Total Bilirubin: 1.6 mg/dL — ABNORMAL HIGH (ref 0.3–1.2)
Total Protein: 4.7 g/dL — ABNORMAL LOW (ref 6.5–8.1)

## 2020-01-17 LAB — CBC
HCT: 30.5 % — ABNORMAL LOW (ref 36.0–46.0)
Hemoglobin: 10.2 g/dL — ABNORMAL LOW (ref 12.0–15.0)
MCH: 30.3 pg (ref 26.0–34.0)
MCHC: 33.4 g/dL (ref 30.0–36.0)
MCV: 90.5 fL (ref 80.0–100.0)
Platelets: 99 10*3/uL — ABNORMAL LOW (ref 150–400)
RBC: 3.37 MIL/uL — ABNORMAL LOW (ref 3.87–5.11)
RDW: 15.4 % (ref 11.5–15.5)
WBC: 10 10*3/uL (ref 4.0–10.5)
nRBC: 0 % (ref 0.0–0.2)

## 2020-01-17 MED ORDER — LACTULOSE 10 GM/15ML PO SOLN
10.0000 g | Freq: Two times a day (BID) | ORAL | Status: DC
  Administered 2020-01-17 – 2020-01-18 (×3): 10 g via ORAL
  Filled 2020-01-17 (×3): qty 15

## 2020-01-17 MED ORDER — INFLUENZA VAC SPLIT QUAD 0.5 ML IM SUSY
0.5000 mL | PREFILLED_SYRINGE | INTRAMUSCULAR | Status: AC
  Administered 2020-01-18: 0.5 mL via INTRAMUSCULAR
  Filled 2020-01-17: qty 0.5

## 2020-01-17 NOTE — Progress Notes (Signed)
Subjective:   Patient reports increased abdominal swelling, and continued pain around her hernia, with minimal pain over TIPS procedure site. She states she walked down the hall yesterday and did well with stairs, though somewhat short of breath. Had 1 hard BM last night and 2 soft BM's this morning after starting lactulose. Reports good appetite. Denies fever, chills, light-headedness or dizziness. Home health was recommended, but patient declines because she feels she has enough help from family at home.  Objective:  Vital signs in last 24 hours: Vitals:   01/17/20 0501 01/17/20 0501 01/17/20 1051 01/17/20 1436  BP:  107/63  (!) 102/58  Pulse:  91  91  Resp:  17  18  Temp:  98.5 F (36.9 C)  97.9 F (36.6 C)  TempSrc:  Oral  Oral  SpO2:  99%  100%  Weight: 65.2 kg  64.6 kg   Height:   4' 10"  (1.473 m)    General: Patient appears chronically ill but in no acute distress. Respiratory: Lung sounds decreased bilaterally but otherwise CTA without wheezing or rhonchi. Cardiovascular: Regular rate and rhythm. No murmurs, rubs, or gallops. No LE pitting edema. Abdominal: Moderate abdominal distention stable since yesterday; however, there is significant tenderness over umbilical hernia site and lower abdomen with guarding. There is some erythema overlying hernia site. hernia is reducible. Bowel sounds normal. Musculoskeletal: Extremities appear cachectic. Skin: Skin appears mildly jaundiced. There are purpura along the forearms and chest wall without active bleeding of the skin. No rashes noted. Psych: Normal affect. Normal tone of voice.   Assessment/Plan:  Principal Problem:   SBP (spontaneous bacterial peritonitis) ([place]) Active Problems:   Thrombocytopenia ([place])   Type 2 diabetes mellitus with other specified complication ([place])   Cirrhosis ([place])   Portal hypertension ([place])   Angiodysplasia of upper gastrointestinal tract  # Refractory, Decompensated Cirrhosis s/p TIPS 9/14 2/2  likely NAFLD, Complicated by Resolving SBP (E. Coli 01/07/20), recurrent large volume ascites,  esophageal varices s/p banding, and thrombocytopenia. EGD 01/16/20 showed Grade I small varices with no stigmata of bleeding. Patient tolerated TIPS without complication and has not had encephalopathy in the hospital. However, has had increasing pain at umbilical hernia site (reproducible).  - GI and IR on board, appreciate their recommendations - Per GI, continued abdominal pain may require repeat abdominal US / dx +/- therapeutic paracentesis  - Will continue cefazolin (Day 10/14 ABX) - Will need secondary prophylaxis with ciprofloxacin 570m daily to prevent recurrent SBP - BP stable, will hold off on fluids - GI decreasing lactulose due to adequacy in BM frequency - conservative management only for umbilical hernia given patient is poor surgical candidate   # Improving Normocytic Anemia Hemoglobin continues to improve. Iron panel shows low iron 27, normal ferritin 121, and saturation ratios unable to be calculated. No active bleeding. - Continue to monitor daily CBC's - Will hold on iron replacement therapy given active infection; consider therapy vs. repeat saturation ratios once SBP resolves  # AKI, Resolved # Asymptomatic Bacteriuria # Non-anion gap Metabolic Acidosis Creatinine initially 1.72. BUN 41, GFR 37. Bicarb remains low. Creatinine improving. 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  - Continue to monitor creatinine   # Diet-Controlled Type II DM Patient had recently been taken off Metformin due to low Hgb A1c of 5.2. Elevated sugars s/p tips though improving, likely reactive.  - continue CBG q8 hours for now - May  initiate SSI if sugars continue to be elevated   # Hypotension Blood pressure improving today.  - Will attempt to avoid fluids as much as possible given cirrhosis - not requiring  paracentesis at this time - Continue to monitor closely  # Hyperkalemia, Resolved - Likely due to RTA type IV, continue to monitor  Barriers to Discharge: Increasing abdominal distention;   Jeralyn Bennett, MD 01/17/2020, 3:19 PM Pager: [phone] After 5pm on weekdays and 1pm on weekends: On Call pager [phone]

## 2020-01-17 NOTE — Progress Notes (Addendum)
Daily Rounding Note  01/17/2020, 10:21 AM  LOS: 9 days   SUBJECTIVE:   Chief complaint: Refractory ascites, s/p 9/15 DIPS.  SBP.   Pain in umbilicus still more intense than previous baseline and has progressed in last 10 d.  No n/v.  Also feels belly swelling again, getting tighter and more uncomfortable which is typical for her ascites.   Not getting daily weights.  65.2 kg this AM, 64.3 kg on 9/15, 68 kg on 8/20.   1 BM yest, 2 so far this AM and feels like she could have another No confusion or foggy brain, no excessive somnolence.   Dry and itchy eyes.    OBJECTIVE:         Vital signs in last 24 hours:    Temp:  [97.6 F (36.4 C)-98.5 F (36.9 C)] 98.5 F (36.9 C) (09/17 0501) Pulse Rate:  [79-91] 91 (09/17 0501) Resp:  [17-19] 17 (09/17 0501) BP: (89-107)/(57-63) 107/63 (09/17 0501) SpO2:  [99 %-100 %] 99 % (09/17 0501) Weight:  [65.2 kg] 65.2 kg (09/17 0501) Last BM Date: 01/13/20 Filed Weights   01/15/20 2039 01/17/20 0501  Weight: 64.3 kg 65.2 kg   General: looks pale, chronically ill.  Comfortable.  Pleasant, calm  Heart: RRR Chest: clear bil.  No labored breathing.   Abdomen:   Soft, tender all over and at umbilicus which is violaceous in color.  Active BS Extremities: no edema.  Large hematoma on lower right arm. Neuro/Psych:  Pleasant, no confusion.  No somnolence. No tremors or asterixis. Fluid speech.    Intake/Output from previous day: 09/16 0701 - 09/17 0700 In: 545.2 [P.O.:240; I.V.:205.2; IV Piggyback:100] Out: 1100 [Urine:1100]  Intake/Output this shift: Total I/O In: 300 [P.O.:300] Out: -   Lab Results: Recent Labs    01/15/20 0406 01/16/20 1015 01/17/20 0436  WBC 5.1 9.2 10.0  HGB 9.5* 9.9* 10.2*  HCT 28.5* 29.7* 30.5*  PLT 94* 100* 99*   BMET Recent Labs    01/15/20 0406 01/16/20 0459 01/17/20 0436  NA 130* 129* 131*  K 5.1 4.2 4.5  CL 104 102 105  CO2 19* 19* 18*    GLUCOSE 261* 252* 185*  BUN 43* 41* 33*  CREATININE 1.38* 1.35* 1.06*  CALCIUM 8.3* 8.4* 8.4*   LFT Recent Labs    01/15/20 0406 01/17/20 0436  PROT 4.7* 4.7*  ALBUMIN 2.6* 2.6*  AST 55* 141*  ALT 26 39  ALKPHOS 93 118  BILITOT 2.0* 1.6*   PT/INR No results for input(s): LABPROT, INR in the last 72 hours. Hepatitis Panel No results for input(s): HEPBSAG, HCVAB, HEPAIGM, HEPBIGM in the last 72 hours.  Studies/Results: No results found.   Scheduled Meds: . feeding supplement (ENSURE ENLIVE)  237 mL Oral BID BM  . [START ON 01/18/2020] influenza vac split quadrivalent PF  0.5 mL Intramuscular Tomorrow-1000  . lactulose  20 g Oral TID  . multivitamin with minerals  1 tablet Oral Daily  . pantoprazole  40 mg Oral Q0600  . ramelteon  8 mg Oral QHS   Continuous Infusions: .  ceFAZolin (ANCEF) IV 2 g (01/17/20 5885)  . lactated ringers 20 mL/hr at 01/17/20 0611   PRN Meds:.acetaminophen, lidocaine   ASSESMENT:   *   Refractory ascites requiring LVPs  Resolved SBP dx'd 3/7, grew E coli. abx day 9. Rocephin 9/8 - 9/13, Ancef d 3: 9/13 - present 4.8 liter paracentesis and DIPS placed 9/14.  On Lactulose for prevention HE.    *   NASH cirrhosis.    *   Umbilical hernia, causing progressive pain.    *   Coagulopathy.    *   Hyponatremia. Na 130  *   CKD, creat stable and improved.    *    H[place]ins anemia.  hgb 7.5 >> 9.5Low iron, ferritin 121.   PRBC x 2, 1 on 9/9, 1 on 9/14.   *   Non-bleeding esoph varices.   Surveillance EGD 9/16: Grade 1, small esoph varices; no intervention.  2, nonbleeding gastric angioectasias treated with APC.  No gastric varices.  Normal examined duodenum.   PLAN   *  Daily standing weights.  Repeat standingscale weight now as earlier measure was bed scale.   If further wt gain and ongoing abdominal swelling, obtain abd [place] in AM.   Continue Unasyn for now,  After completing a 2 weeks coourse of abx, begin Cipro 500 mg/daily for SBP  prophylaxis.      Azucena Freed  01/17/2020, 10:21 AM Phone [phone]   Attending physician's note   I have taken an interval history, reviewed the chart and examined the patient. I agree with the Advanced Practitioner's note, impression and recommendations.   59 yr F with decompensated cirrhosis, ascites, SBP s/p TIPS  Complains of abdominal pain, if persistent consider repeat abdominal ultrasound and paracentesis if has significant ascites.  Will also need rechecking of ascites fluid to make sure does not have a recurrent SBP  Day 10 of broad-spectrum antibiotics, completed 2 weeks and then transition to Cipro 5 mg daily for SBP prophylaxis  No evidence of hepatic encephalopathy.  Continue lactulose with goal 2-3 soft bowel movements daily  I have spent 35 minutes of patient care (this includes precharting, chart review, review of results, face-to-face time used for counseling as well as treatment plan and follow-up. The patient was provided an opportunity to ask questions and all were answered. The patient agreed with the plan and demonstrated an understanding of the instructions.  Damaris Hippo , MD [phone]

## 2020-01-17 NOTE — Progress Notes (Signed)
Physical Therapy Treatment Patient Details Name: Meagan Peck MRN: [id number] DOB: [birth year] Today's Date: 01/17/2020    History of Present Illness 59 year old female with a history of diabetes, hypertension, and recently diagnosed cirrhosis with weekly paracentesis presenting with abdominal pain and a recent paracentesis that showed findings concerning for SBP.  Patient was contacted by her GI doctor and advised to come into the ED for further work-up and management of the SBP. Patient reports that since Monday, 01/06/2020, she has been having some feelings of shakiness and cold, right-sided abdominal pain, mildly decreased appetite, and abdominal bloating.  Also endorses some headaches. Pt underwent US guided paracentesis from RLQ on 9/10 and 9/13. Pt then underwent TIPs procedure on 9/14.    PT Comments    The pt is progressing very well with PT goals this morning. She was able to complete stair training and significantly increase her distance for ambulation with use of a SPC. The pt did use rail in hall intermittently with gait, but was able to ambulate with SPC only for last 178f in hall. The pt is safe for d/c at this point with support of her family, but will continue to benefit from skilled PT to progress functional strength, endurance, and gait speed to improve mobility and independence at home as well as to reduce risk of falls.     Follow Up Recommendations  Home health PT;Supervision - Intermittent     Equipment Recommendations  None recommended by PT    Recommendations for Other Services       Precautions / Restrictions Precautions Precautions: Fall Restrictions Weight Bearing Restrictions: No    Mobility  Bed Mobility Overal bed mobility: Needs Assistance             General bed mobility comments: pt received on BSC, returned to recliner  Transfers Overall transfer level: Needs assistance Equipment used: Quad cane Transfers: Sit to/from Stand Sit to Stand:  Supervision         General transfer comment: no assist to steady, good use of UE  Ambulation/Gait Ambulation/Gait assistance: Min guard Gait Distance (Feet): 125 Feet Assistive device: Quad cane Gait Pattern/deviations: Step-through pattern;Decreased stride length;Shuffle Gait velocity: 0.16 m/s Gait velocity interpretation: <1.8 ft/sec, indicate of risk for recurrent falls General Gait Details: very short steps with slowed gait, but no LOB. reaching for rail in hall and cane at times. able to walk last 100 ft without BUE support   Stairs Stairs: Yes Stairs assistance: Min guard Stair Management: One rail Right;Forwards;With cane Number of Stairs: 1 (x 4) General stair comments: Pt able to step up with RLE and BUE support. no LOB, but very slow      Balance Overall balance assessment: Needs assistance Sitting-balance support: No upper extremity supported;Feet supported Sitting balance-Leahy Scale: Good     Standing balance support: Single extremity supported;Bilateral upper extremity supported Standing balance-Leahy Scale: Poor Standing balance comment: reliant on UE support for balance, single UE support for static                            Cognition Arousal/Alertness: Awake/alert Behavior During Therapy: WFL for tasks assessed/performed Overall Cognitive Status: Within Functional Limits for tasks assessed                                        Exercises  General Comments General comments (skin integrity, edema, etc.): VSS on RA      Pertinent Vitals/Pain Pain Assessment: Faces Faces Pain Scale: Hurts a little bit Pain Location: generally sore Pain Descriptors / Indicators: Grimacing;Sore Pain Intervention(s): Limited activity within patient's tolerance;Monitored during session           PT Goals (current goals can now be found in the care plan section) Acute Rehab PT Goals Patient Stated Goal: I'm fine.  I will have  help at home.  I just want to move a little better. PT Goal Formulation: With patient Time For Goal Achievement: 01/29/20 Potential to Achieve Goals: Good Progress towards PT goals: Progressing toward goals    Frequency    Min 3X/week      PT Plan Current plan remains appropriate       AM-PAC PT "6 Clicks" Mobility   Outcome Measure  Help needed turning from your back to your side while in a flat bed without using bedrails?: None Help needed moving from lying on your back to sitting on the side of a flat bed without using bedrails?: A Little Help needed moving to and from a bed to a chair (including a wheelchair)?: A Little Help needed standing up from a chair using your arms (e.g., wheelchair or bedside chair)?: A Little Help needed to walk in hospital room?: A Little Help needed climbing 3-5 steps with a railing? : A Little 6 Click Score: 19    End of Session Equipment Utilized During Treatment: Gait belt Activity Tolerance: Patient tolerated treatment well Patient left: in chair;with call bell/phone within reach;with chair alarm set Nurse Communication: Mobility status PT Visit Diagnosis: Other abnormalities of gait and mobility (R26.89);Unsteadiness on feet (R26.81);Muscle weakness (generalized) (M62.81)     Time: 9935-7017 PT Time Calculation (min) (ACUTE ONLY): 26 min  Charges:  $Gait Training: 23-37 mins                     Karma Ganja, PT, DPT   Acute Rehabilitation Department Pager #: [phone]   Otho Bellows 01/17/2020, 10:33 AM

## 2020-01-18 DIAGNOSIS — Z95828 Presence of other vascular implants and grafts: Secondary | ICD-10-CM

## 2020-01-18 LAB — TYPE AND SCREEN
ABO/RH(D): A POS
Antibody Screen: NEGATIVE
Unit division: 0
Unit division: 0
Unit division: 0
Unit division: 0

## 2020-01-18 LAB — BPAM RBC
Blood Product Expiration Date: 202110082359
Blood Product Expiration Date: 202110082359
Blood Product Expiration Date: 202110132359
Blood Product Expiration Date: 202110142359
ISSUE DATE / TIME: 202109141019
ISSUE DATE / TIME: 202109141019
ISSUE DATE / TIME: 202109150935
ISSUE DATE / TIME: 202109160907
Unit Type and Rh: 600
Unit Type and Rh: 600
Unit Type and Rh: 6200
Unit Type and Rh: 6200

## 2020-01-18 LAB — COMPREHENSIVE METABOLIC PANEL
ALT: 23 U/L (ref 0–44)
AST: 90 U/L — ABNORMAL HIGH (ref 15–41)
Albumin: 2.5 g/dL — ABNORMAL LOW (ref 3.5–5.0)
Alkaline Phosphatase: 107 U/L (ref 38–126)
Anion gap: 6 (ref 5–15)
BUN: 26 mg/dL — ABNORMAL HIGH (ref 6–20)
CO2: 19 mmol/L — ABNORMAL LOW (ref 22–32)
Calcium: 8.3 mg/dL — ABNORMAL LOW (ref 8.9–10.3)
Chloride: 105 mmol/L (ref 98–111)
Creatinine, Ser: 1.11 mg/dL — ABNORMAL HIGH (ref 0.44–1.00)
GFR calc Af Amer: >60 mL/min (ref >60)
GFR calc non Af Amer: 55 mL/min — ABNORMAL LOW (ref >60)
Glucose, Bld: 148 mg/dL — ABNORMAL HIGH (ref 70–99)
Potassium: 4.7 mmol/L (ref 3.5–5.1)
Sodium: 130 mmol/L — ABNORMAL LOW (ref 135–145)
Total Bilirubin: 2.1 mg/dL — ABNORMAL HIGH (ref 0.3–1.2)
Total Protein: 4.6 g/dL — ABNORMAL LOW (ref 6.5–8.1)

## 2020-01-18 LAB — GLUCOSE, CAPILLARY
Glucose-Capillary: 120 mg/dL — ABNORMAL HIGH (ref 70–99)
Glucose-Capillary: 142 mg/dL — ABNORMAL HIGH (ref 70–99)
Glucose-Capillary: 148 mg/dL — ABNORMAL HIGH (ref 70–99)
Glucose-Capillary: 193 mg/dL — ABNORMAL HIGH (ref 70–99)

## 2020-01-18 LAB — CBC
HCT: 27.9 % — ABNORMAL LOW (ref 36.0–46.0)
Hemoglobin: 9.1 g/dL — ABNORMAL LOW (ref 12.0–15.0)
MCH: 29.8 pg (ref 26.0–34.0)
MCHC: 32.6 g/dL (ref 30.0–36.0)
MCV: 91.5 fL (ref 80.0–100.0)
Platelets: 100 10*3/uL — ABNORMAL LOW (ref 150–400)
RBC: 3.05 MIL/uL — ABNORMAL LOW (ref 3.87–5.11)
RDW: 15.8 % — ABNORMAL HIGH (ref 11.5–15.5)
WBC: 9.5 10*3/uL (ref 4.0–10.5)
nRBC: 0 % (ref 0.0–0.2)

## 2020-01-18 LAB — CULTURE, BODY FLUID W GRAM STAIN -BOTTLE: Culture: NO GROWTH

## 2020-01-18 NOTE — Progress Notes (Signed)
Subjective:   Patient reports reports being OK but still has some abdominal pain. No N/V. She had 3 bowel movement yesterday w Lactulose. One bowel movement this AM. We discussed GI rec for repeating para. She is agreeable with that.  Objective:  Vital signs in last 24 hours: Vitals:   01/17/20 1436 01/17/20 2145 01/18/20 0420 01/18/20 0500  BP: (!) 102/58 (!) 95/59 104/62   Pulse: 91 87 79   Resp: 18 18 16    Temp: 97.9 F (36.6 C) 97.8 F (36.6 C) 98.3 F (36.8 C)   TempSrc: Oral Oral Oral   SpO2: 100% 100% 97%   Weight:    64.8 kg  Height:        Constitutional: Appears ill and tired, No acute distress.  Cardiovascular: RRR, nl S1S2, no murmur, 1+ LEE Respiratory: Effort normal and breath sounds normal. Anterior and lateral chest auscultation are nl. No respiratory distress.  GI: Distended but soft. Mild generalized tenderness. Neurological: Is alert and oriented x 3  Skin: Not diaphoretic. No erythema. Mild jaundice Psychiatric: Normal mood and affect. Behavior is normal. Judgment and thought content normal.    Assessment/Plan:  Principal Problem:   SBP (spontaneous bacterial peritonitis) (HCC) Active Problems:   Thrombocytopenia ([place])   Type 2 diabetes mellitus with other specified complication ([place])   Cirrhosis ([place])   Portal hypertension (HCC)   Angiodysplasia of upper gastrointestinal tract  #SBP # Refractory, Decompensated Cirrhosis 2/2 likely NAFLD Not on diuretics due to intolerance Was receiving weekly para out pt before admission  s/p TIPS 8/54  Complicated by Resolving SBP (E. Coli 01/07/20), recurrent large volume ascites,  esophageal varices s/p banding, and thrombocytopenia. EGD 01/16/20 showed Grade I small varices with no stigmata of bleeding. Patient tolerated TIPS without complication and has not had encephalopathy in the hospital. However, has had increasing pain at umbilical hernia site (reproducible).   -Continue Cefazolin (day11/14). When  discharge, will prescribe ciprofloxacin for ppx - GI and IR on board, plan is to do another therapeutic and diagnostic para (per IR, will likely do it tomorrow) - Per GI, continued abdominal pain may require repeat abdominal US / dx +/- therapeutic paracentesis  - continue lactulose - conservative management only for umbilical hernia given patient is poor surgical candidate   # Improving Normocytic Anemia Stable. Hemoglobin continues to improve. Iron panel shows low iron 27, normal ferritin 121, and saturation ratios unable to be calculated. No active bleeding. - CBC daily - Will hold on iron replacement therapy given active infection; consider therapy vs. repeat saturation ratios once SBP resolves  # AKI, Resolved #Hyperkalemia resolved # Asymptomatic Bacteriuria # Non-anion gap Metabolic Acidosis Creatinine initially 1.72. BUN 41, GFR 37. Bicarb remains low. Creatinine improving. 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  - Continue to monitor creatinine  BMP Latest Ref Rng & Units 01/18/2020 01/17/2020 01/16/2020  Glucose 70 - 99 mg/dL 148(H) 185(H) 252(H)  BUN 6 - 20 mg/dL 26(H) 33(H) 41(H)  Creatinine 0.44 - 1.00 mg/dL 1.11(H) 1.06(H) 1.35(H)  BUN/Creat Ratio 6 - 22 (calc) - - -  Sodium 135 - 145 mmol/L 130(L) 131(L) 129(L)  Potassium 3.5 - 5.1 mmol/L 4.7 4.5 4.2  Chloride 98 - 111 mmol/L 105 105 102  CO2 22 - 32 mmol/L 19(L) 18(L) 19(L)  Calcium 8.9 - 10.3 mg/dL 8.3(L) 8.4(L) 8.4(L)    # Diet-Controlled Type II DM Patient had recently been taken  off Metformin due to low Hgb A1c of 5.2. Elevated sugars s/p tips though improving, likely reactive.  - continue CBG q8 hours for now - May initiate SSI if sugars continue to be elevated   # Hypotension -BP stable. If hypotensive, will give Albumen as needed. No IV F   Barriers to Discharge: Increasing abdominal distention;   Dewayne Hatch,  MD 01/18/2020, 7:14 AM Pager: [phone] After 5pm on weekdays and 1pm on weekends: On Call pager [phone]

## 2020-01-18 NOTE — Progress Notes (Addendum)
Progress Note  Chief Complaint:    Cirrhosis / ascites / abdominal pain     Attending physician's note   I have taken an interval history, reviewed the chart and examined the patient. I agree with the Advanced Practitioner's note, impression and recommendations.   Refractory ascites, SBP s/p TIPS Will need repeat paracentesis, appears to be re accumulation ascites, obtain cell count and culture Continue antibiotics  GI will continue to follow along  I have spent 25 minutes of patient care (this includes precharting, chart review, review of results, face-to-face time used for counseling as well as treatment plan and follow-up. The patient was provided an opportunity to ask questions and all were answered. The patient agreed with the plan and demonstrated an understanding of the instructions.  Damaris Hippo , MD [phone]     ASSESSMENT / PLAN:    # Cirrhosis with refractory ascites / small esophageal varices, HE and SBP ( this admission).   --Required multiple LVPs. Underwent TIPS this admission. --Having generalized abdominal pain. Recommend teaching service send for repeat LVP ( goal will be 4-5 liters) with IV albumin. Send fluid studies for cell count and culture.  --On day 11 of antibiotics ( Ancef) for SBP. Should complete two weeks of antibiotics then transition to daily Cipro 500 mg for SBP prophylaxis.    --Continue BID lactuluse --Continue low sodium diet and daily weight. She is 142 pounds, same as yesterday   # AKI, resolved.   # [place] anemia --Hgb stable overall in the 9-10 range.   # Gastric AVM, nonbleeding --treated 9/16  SUBJECTIVE:    Abdominal pain only slightly better compared to yesterday.    OBJECTIVE:    Scheduled inpatient medications:  . feeding supplement (ENSURE ENLIVE)  237 mL Oral BID BM  . influenza vac split quadrivalent PF  0.5 mL Intramuscular Tomorrow-1000  . lactulose  10 g Oral BID  . multivitamin with minerals  1 tablet  Oral Daily  . pantoprazole  40 mg Oral Q0600  . ramelteon  8 mg Oral QHS   Continuous inpatient infusions:  .  ceFAZolin (ANCEF) IV 2 g (01/18/20 0515)  . lactated ringers Stopped (01/17/20 1452)   PRN inpatient medications: acetaminophen, lidocaine  Vital signs in last 24 hours: Temp:  [97.8 F (36.6 C)-98.3 F (36.8 C)] 98.3 F (36.8 C) (09/18 0420) Pulse Rate:  [79-91] 79 (09/18 0420) Resp:  [16-18] 16 (09/18 0420) BP: (95-104)/(58-62) 104/62 (09/18 0420) SpO2:  [97 %-100 %] 97 % (09/18 0420) Weight:  [64.6 kg-64.8 kg] 64.8 kg (09/18 0500) Last BM Date: 01/17/20  Intake/Output Summary (Last 24 hours) at 01/18/2020 0936 Last data filed at 01/18/2020 0300 Gross per 24 hour  Intake 728.36 ml  Output --  Net 728.36 ml     Physical Exam:  . General: Alert, in NAD . Heart:  Regular rate and rhythm. 2+ bilateral pedal edema . Pulmonary: Normal respiratory effort . Abdomen: Soft, nondistended, Nontender. Normal bowel sounds. No masses felt. . Neurologic: Alert and oriented . Psych: Pleasant. Cooperative.   Filed Weights   01/17/20 0501 01/17/20 1051 01/18/20 0500  Weight: 65.2 kg 64.6 kg 64.8 kg    Intake/Output from previous day: 09/17 0701 - 09/18 0700 In: 1028.4 [P.O.:600; I.V.:128.4; IV Piggyback:300] Out: -  Intake/Output this shift: No intake/output data recorded.    Lab Results: Recent Labs    01/16/20 1015 01/17/20 0436 01/18/20 0404  WBC 9.2 10.0 9.5  HGB 9.9* 10.2* 9.1*  HCT 29.7* 30.5* 27.9*  PLT 100* 99* 100*   BMET Recent Labs    01/16/20 0459 01/17/20 0436 01/18/20 0404  NA 129* 131* 130*  K 4.2 4.5 4.7  CL 102 105 105  CO2 19* 18* 19*  GLUCOSE 252* 185* 148*  BUN 41* 33* 26*  CREATININE 1.35* 1.06* 1.11*  CALCIUM 8.4* 8.4* 8.3*   LFT Recent Labs    01/18/20 0404  PROT 4.6*  ALBUMIN 2.5*  AST 90*  ALT 23  ALKPHOS 107  BILITOT 2.1*   PT/INR No results for input(s): LABPROT, INR in the last 72 hours. Hepatitis Panel No  results for input(s): HEPBSAG, HCVAB, HEPAIGM, HEPBIGM in the last 72 hours.  No results found.    Principal Problem:   SBP (spontaneous bacterial peritonitis) ([place]) Active Problems:   Thrombocytopenia ([place])   Type 2 diabetes mellitus with other specified complication ([place])   Cirrhosis ([place])   Portal hypertension ([place])   Angiodysplasia of upper gastrointestinal tract     LOS: 10 days   Tye Savoy ,NP 01/18/2020, 9:36 AM

## 2020-01-19 DIAGNOSIS — K228 Other specified diseases of esophagus: Secondary | ICD-10-CM

## 2020-01-19 DIAGNOSIS — K31819 Angiodysplasia of stomach and duodenum without bleeding: Secondary | ICD-10-CM

## 2020-01-19 DIAGNOSIS — B9629 Other Escherichia coli [E. coli] as the cause of diseases classified elsewhere: Secondary | ICD-10-CM

## 2020-01-19 LAB — CBC
HCT: 28.1 % — ABNORMAL LOW (ref 36.0–46.0)
Hemoglobin: 9.3 g/dL — ABNORMAL LOW (ref 12.0–15.0)
MCH: 30.8 pg (ref 26.0–34.0)
MCHC: 33.1 g/dL (ref 30.0–36.0)
MCV: 93 fL (ref 80.0–100.0)
Platelets: 97 10*3/uL — ABNORMAL LOW (ref 150–400)
RBC: 3.02 MIL/uL — ABNORMAL LOW (ref 3.87–5.11)
RDW: 15.9 % — ABNORMAL HIGH (ref 11.5–15.5)
WBC: 7.9 10*3/uL (ref 4.0–10.5)
nRBC: 0 % (ref 0.0–0.2)

## 2020-01-19 LAB — GLUCOSE, CAPILLARY
Glucose-Capillary: 142 mg/dL — ABNORMAL HIGH (ref 70–99)
Glucose-Capillary: 239 mg/dL — ABNORMAL HIGH (ref 70–99)

## 2020-01-19 LAB — COMPREHENSIVE METABOLIC PANEL
ALT: 12 U/L (ref 0–44)
AST: 50 U/L — ABNORMAL HIGH (ref 15–41)
Albumin: 2.4 g/dL — ABNORMAL LOW (ref 3.5–5.0)
Alkaline Phosphatase: 108 U/L (ref 38–126)
Anion gap: 8 (ref 5–15)
BUN: 24 mg/dL — ABNORMAL HIGH (ref 6–20)
CO2: 19 mmol/L — ABNORMAL LOW (ref 22–32)
Calcium: 8 mg/dL — ABNORMAL LOW (ref 8.9–10.3)
Chloride: 104 mmol/L (ref 98–111)
Creatinine, Ser: 1.08 mg/dL — ABNORMAL HIGH (ref 0.44–1.00)
GFR calc Af Amer: >60 mL/min (ref >60)
GFR calc non Af Amer: 57 mL/min — ABNORMAL LOW (ref >60)
Glucose, Bld: 231 mg/dL — ABNORMAL HIGH (ref 70–99)
Potassium: 4.6 mmol/L (ref 3.5–5.1)
Sodium: 131 mmol/L — ABNORMAL LOW (ref 135–145)
Total Bilirubin: 2.5 mg/dL — ABNORMAL HIGH (ref 0.3–1.2)
Total Protein: 4.6 g/dL — ABNORMAL LOW (ref 6.5–8.1)

## 2020-01-19 MED ORDER — LACTULOSE 10 GM/15ML PO SOLN
10.0000 g | Freq: Two times a day (BID) | ORAL | Status: DC
  Administered 2020-01-19 (×2): 10 g via ORAL
  Filled 2020-01-19 (×2): qty 15

## 2020-01-19 NOTE — Progress Notes (Signed)
Subjective:  Patient was seen and evaluated at bedside on morning rounds. She is doing OK but still has some abdominal pain. No nausea or vomiting. Had 2  bowel movements this morning. We discussed the plan for repeating paracentesis later today or likely tomorrow and will continue antibiotic.  She agrees with plan.  Objective:  Vital signs in last 24 hours: Vitals:   01/18/20 2003 01/19/20 0416 01/19/20 0500 01/19/20 1407  BP: 97/60 100/61  (!) 95/57  Pulse: 78 80  85  Resp: 17 17  17   Temp: 98.1 F (36.7 C) 98.2 F (36.8 C)  (!) 97.4 F (36.3 C)  TempSrc: Oral Oral  Axillary  SpO2: 100% 100%  100%  Weight:   67 kg   Height:        Constitutional: Appears ill and tired, No acute distress.  Cardiovascular: RRR, nl S1S2, no murmur, 1+ LEE Respiratory: Effort normal and breath sounds normal. Anterior and lateral chest auscultation are nl. No respiratory distress.  GI: Distended but soft. Mild generalized tenderness. Neurological: Is alert and oriented x 3  Skin: Not diaphoretic. No erythema. Mild jaundice Psychiatric: Normal mood and affect. Behavior is normal. Judgment and thought content normal.   Assessment/Plan:  Principal Problem:   SBP (spontaneous bacterial peritonitis) (HCC) Active Problems:   Thrombocytopenia ([place])   Type 2 diabetes mellitus with other specified complication ([place])   Cirrhosis ([place])   Portal hypertension ([place])   Angiodysplasia of upper gastrointestinal tract  # Pan sensitive Ecoli SBP # Refractory, Decompensated Cirrhosis 2/2 likely NAFLD  Recurrent large volume ascites,  Not on diuretics due to intolerance. Was receiving weekly para out pt befo is in no re admission. Has esophageal varices s/p banding, and thrombocytopenia.   S/p outpatient paracentesis on 9/10>SHowed SBP S/p therapeutic and diagnostic paracentesis 9/13>Showed resolution of SBP on Abx S/p TIPS 9/14  S/p EGD 01/16/2020 (EGD 01/16/20 showed Grade I small varices with no  stigmata of bleeding.)  -Continue Cefazolin (day12/14). When discharge, will prescribe ciprofloxacin for ppx - GI and IR on board, plan is to do another therapeutic and diagnostic para likely tomorrow - continue lactulose BID - Umbilical hernia: conservative management only for umbilical hernia given patient is poor surgical candidate   # Improving Normocytic Anemia Stable.  - CBC daily - Will hold on iron replacement therapy given active infection; consider therapy vs. repeat saturation ratios once SBP resolves  # AKI, Resolved #Hyperkalemia resolved # Asymptomatic Bacteriuria # Non-anion gap Metabolic Acidosis Creatinine initially 1.72. BUN 41, GFR 37. Bicarb remains low. Creatinine improving. 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  - Continue daily CMP  CMP Latest Ref Rng & Units 01/19/2020 01/18/2020 01/17/2020  Glucose 70 - 99 mg/dL 231(H) 148(H) 185(H)  BUN 6 - 20 mg/dL 24(H) 26(H) 33(H)  Creatinine 0.44 - 1.00 mg/dL 1.08(H) 1.11(H) 1.06(H)  Sodium 135 - 145 mmol/L 131(L) 130(L) 131(L)  Potassium 3.5 - 5.1 mmol/L 4.6 4.7 4.5  Chloride 98 - 111 mmol/L 104 105 105  CO2 22 - 32 mmol/L 19(L) 19(L) 18(L)  Calcium 8.9 - 10.3 mg/dL 8.0(L) 8.3(L) 8.4(L)  Total Protein 6.5 - 8.1 g/dL 4.6(L) 4.6(L) 4.7(L)  Total Bilirubin 0.3 - 1.2 mg/dL 2.5(H) 2.1(H) 1.6(H)  Alkaline Phos 38 - 126 U/L 108 107 118  AST 15 - 41 U/L 50(H) 90(H) 141(H)  ALT 0 - 44 U/L 12 23 39   # Diet-Controlled Type  II DM Patient had recently been taken off Metformin due to low Hgb A1c of 5.2. Elevated sugars s/p tips though improving, likely reactive.  No Tx in hospital so far  # Hypotension -BP stable. If hypotensive, will give Albumen as needed. No IV F  Barriers to Discharge: Increasing abdominal distention;  Anticipate DC in 1-2 days  Dewayne Hatch, MD 01/19/2020, 3:16 PM Pager: [phone] After 5pm on weekdays  and 1pm on weekends: On Call pager [phone]

## 2020-01-19 NOTE — Progress Notes (Addendum)
Progress Note  Chief Complaint:         Attending physician's note   I have taken an interval history, reviewed the chart and examined the patient. I agree with the Advanced Practitioner's note, impression and recommendations.    Continue antibiotics Patient may be able to go home after paracentesis if abdominal pain improves and no evidence of recurrent SBP Continue current regimen and supportive care   I have spent 25 minutes of patient care (this includes precharting, chart review, review of results, face-to-face time used for counseling as well as treatment plan and follow-up. The patient was provided an opportunity to ask questions and all were answered. The patient agreed with the plan and demonstrated an understanding of the instructions.  Damaris Hippo , MD [phone]     ASSESSMENT / PLAN:    # Cirrhosis with refractory ascites / small esophageal varices, HE and SBP ( this admission).   --Required multiple LVPs. Underwent TIPS this admission. --Having generalized abdominal pain / tenderness. Recommend teaching service send for repeat LVP ( goal will be 4-5 liters) with IV albumin. Send fluid studies for cell count and culture. This likely will not be done over the weekend --On day 12 of antibiotics ( Ancef) for SBP. Should complete two weeks of antibiotics then transition to daily Cipro 500 mg for SBP prophylaxis.    --Continue BID lactuluse --Continue low sodium diet and daily weight. Not sure today's weigh it accurate as it has her up 5 pounds since yesterday.  --Hopefully discharge soon   # AKI, resolved.   # [place] anemia --Hgb stable overall in the 9-10 range.   # Gastric AVM, nonbleeding --treated 9/16    SUBJECTIVE:   Stomach is "sore" No other complaints    OBJECTIVE:    Scheduled inpatient medications:  . feeding supplement (ENSURE ENLIVE)  237 mL Oral BID BM  . lactulose  10 g Oral BID  . multivitamin with minerals  1 tablet Oral  Daily  . pantoprazole  40 mg Oral Q0600  . ramelteon  8 mg Oral QHS   Continuous inpatient infusions:  .  ceFAZolin (ANCEF) IV Stopped (01/19/20 5102)  . lactated ringers 20 mL/hr at 01/19/20 0700   PRN inpatient medications: acetaminophen, lidocaine  Vital signs in last 24 hours: Temp:  [98 F (36.7 C)-98.2 F (36.8 C)] 98.2 F (36.8 C) (09/19 0416) Pulse Rate:  [78-80] 80 (09/19 0416) Resp:  [16-17] 17 (09/19 0416) BP: (92-100)/(58-61) 100/61 (09/19 0416) SpO2:  [100 %] 100 % (09/19 0416) Weight:  [67 kg] 67 kg (09/19 0500) Last BM Date: 01/19/20  Intake/Output Summary (Last 24 hours) at 01/19/2020 1141 Last data filed at 01/19/2020 1009 Gross per 24 hour  Intake 1455.02 ml  Output --  Net 1455.02 ml     Physical Exam:  . General: Alert female in NAD . Heart:  Regular rate and rhythm. Bilateral 1-2+ pedal edema. . Pulmonary: Normal respiratory effort . Abdomen: Soft, mildly distended. Diffuse tender, grimaces to even light palpation.  Normal bowel sounds. No masses felt. . Neurologic: Alert and oriented . Psych: Pleasant. Cooperative.   Filed Weights   01/17/20 1051 01/18/20 0500 01/19/20 0500  Weight: 64.6 kg 64.8 kg 67 kg    Intake/Output from previous day: 09/18 0701 - 09/19 0700 In: 5852 [P.O.:780; I.V.:35.1; IV Piggyback:399.9] Out: -  Intake/Output this shift: Total I/O In: 240 [P.O.:240] Out: -     Lab Results: Recent Labs  01/17/20 0436 01/18/20 0404 01/19/20 0957  WBC 10.0 9.5 7.9  HGB 10.2* 9.1* 9.3*  HCT 30.5* 27.9* 28.1*  PLT 99* 100* 97*   BMET Recent Labs    01/17/20 0436 01/18/20 0404 01/19/20 0957  NA 131* 130* 131*  K 4.5 4.7 4.6  CL 105 105 104  CO2 18* 19* 19*  GLUCOSE 185* 148* 231*  BUN 33* 26* 24*  CREATININE 1.06* 1.11* 1.08*  CALCIUM 8.4* 8.3* 8.0*   LFT Recent Labs    01/19/20 0957  PROT 4.6*  ALBUMIN 2.4*  AST 50*  ALT 12  ALKPHOS 108  BILITOT 2.5*   PT/INR No results for input(s): LABPROT, INR in  the last 72 hours. Hepatitis Panel No results for input(s): HEPBSAG, HCVAB, HEPAIGM, HEPBIGM in the last 72 hours.  No results found.    Principal Problem:   SBP (spontaneous bacterial peritonitis) ([place]) Active Problems:   Thrombocytopenia ([place])   Type 2 diabetes mellitus with other specified complication ([place])   Cirrhosis ([place])   Portal hypertension ([place])   Angiodysplasia of upper gastrointestinal tract     LOS: 11 days   Tye Savoy ,NP 01/19/2020, 11:41 AM

## 2020-01-19 NOTE — Progress Notes (Signed)
Pharmacy Antibiotic Note  Meagan Peck is a 59 y.o. female admitted on 01/07/2020 with Ecoli SBP.  Pharmacy has been consulted for cefazolin dosing.  Renal function stable, ClCr ~45 ml/min. Afebrile, WBC wnl. Noted PCN allergy as a child and tolerated ceftriaxone, has been tolerating cefazolin. Treat for two weeks per GI then continue cipro 500 daily for SBP prophylaxis  Plan: Continue cefazolin 2g Q8 hr until 9/21; consider change to PO cephalexin 585m Q6 hr or PO cipro 5040mBID to complete treatment course On 9/22, start cipro 50045maily for SBP ppx     Temp (24hrs), Avg:98.1 F (36.7 C), Min:98 F (36.7 C), Max:98.2 F (36.8 C)  Recent Labs  Lab 01/14/20 0431 01/15/20 0406 01/16/20 0459 01/16/20 1015 01/17/20 0436 01/18/20 0404  WBC 4.2 5.1  --  9.2 10.0 9.5  CREATININE 1.38* 1.38* 1.35*  --  1.06* 1.11*    Estimated Creatinine Clearance: 45.3 mL/min (A) (by C-G formula based on SCr of 1.11 mg/dL (H)).    Allergies  Allergen Reactions  . Penicillins Anaphylaxis    Happened as a young child  . Atrovent [Ipratropium] Palpitations    As per patient  . Naproxen Rash  . Quinine Derivatives Rash  . Sulfa Antibiotics Rash    Antimicrobials this admission: Ceftriaxone 9/8 >> 9/13 Cefazolin 9/14 >> (9/21)  Microbiology results: 9/7 BCx: ngtd 9/7 ascites: Ecoli (Pan-S) 9/10 ascites: ngtd   Thank you for allowing pharmacy to be a part of this patient's care.   YujBerenice BoutonharmD PGY2 Pharmacy Resident Phone between 7 am - 3:30 pm: [phone] check AMION for all [facility] numbers After 10:00 PM, call [phone]/19/2021 8:55 AM

## 2020-01-20 ENCOUNTER — Inpatient Hospital Stay (HOSPITAL_COMMUNITY): Payer: No Typology Code available for payment source

## 2020-01-20 DIAGNOSIS — R140 Abdominal distension (gaseous): Secondary | ICD-10-CM

## 2020-01-20 DIAGNOSIS — I9589 Other hypotension: Secondary | ICD-10-CM

## 2020-01-20 DIAGNOSIS — K7581 Nonalcoholic steatohepatitis (NASH): Secondary | ICD-10-CM

## 2020-01-20 HISTORY — PX: IR PARACENTESIS: IMG2679

## 2020-01-20 LAB — CBC
HCT: 30.3 % — ABNORMAL LOW (ref 36.0–46.0)
Hemoglobin: 9.9 g/dL — ABNORMAL LOW (ref 12.0–15.0)
MCH: 30 pg (ref 26.0–34.0)
MCHC: 32.7 g/dL (ref 30.0–36.0)
MCV: 91.8 fL (ref 80.0–100.0)
Platelets: 110 10*3/uL — ABNORMAL LOW (ref 150–400)
RBC: 3.3 MIL/uL — ABNORMAL LOW (ref 3.87–5.11)
RDW: 15.8 % — ABNORMAL HIGH (ref 11.5–15.5)
WBC: 7.9 10*3/uL (ref 4.0–10.5)
nRBC: 0 % (ref 0.0–0.2)

## 2020-01-20 LAB — GRAM STAIN: Gram Stain: NONE SEEN

## 2020-01-20 LAB — COMPREHENSIVE METABOLIC PANEL
ALT: 12 U/L (ref 0–44)
AST: 52 U/L — ABNORMAL HIGH (ref 15–41)
Albumin: 2.6 g/dL — ABNORMAL LOW (ref 3.5–5.0)
Alkaline Phosphatase: 118 U/L (ref 38–126)
Anion gap: 6 (ref 5–15)
BUN: 22 mg/dL — ABNORMAL HIGH (ref 6–20)
CO2: 24 mmol/L (ref 22–32)
Calcium: 8.5 mg/dL — ABNORMAL LOW (ref 8.9–10.3)
Chloride: 103 mmol/L (ref 98–111)
Creatinine, Ser: 0.96 mg/dL (ref 0.44–1.00)
GFR calc Af Amer: >60 mL/min (ref >60)
GFR calc non Af Amer: >60 mL/min (ref >60)
Glucose, Bld: 134 mg/dL — ABNORMAL HIGH (ref 70–99)
Potassium: 5 mmol/L (ref 3.5–5.1)
Sodium: 133 mmol/L — ABNORMAL LOW (ref 135–145)
Total Bilirubin: 2.2 mg/dL — ABNORMAL HIGH (ref 0.3–1.2)
Total Protein: 4.9 g/dL — ABNORMAL LOW (ref 6.5–8.1)

## 2020-01-20 LAB — BODY FLUID CELL COUNT WITH DIFFERENTIAL
Eos, Fluid: 2 %
Lymphs, Fluid: 82 %
Monocyte-Macrophage-Serous Fluid: 15 % — ABNORMAL LOW (ref 50–90)
Neutrophil Count, Fluid: 1 % (ref 0–25)
Total Nucleated Cell Count, Fluid: 168 cu mm (ref 0–1000)

## 2020-01-20 LAB — GLUCOSE, CAPILLARY
Glucose-Capillary: 111 mg/dL — ABNORMAL HIGH (ref 70–99)
Glucose-Capillary: 146 mg/dL — ABNORMAL HIGH (ref 70–99)
Glucose-Capillary: 188 mg/dL — ABNORMAL HIGH (ref 70–99)

## 2020-01-20 MED ORDER — ALBUMIN HUMAN 25 % IV SOLN
12.5000 g | Freq: Once | INTRAVENOUS | Status: AC
  Administered 2020-01-20: 12.5 g via INTRAVENOUS
  Filled 2020-01-20: qty 50

## 2020-01-20 MED ORDER — LIDOCAINE HCL 1 % IJ SOLN
INTRAMUSCULAR | Status: AC
  Filled 2020-01-20: qty 20

## 2020-01-20 MED ORDER — LACTULOSE 10 GM/15ML PO SOLN
10.0000 g | Freq: Every day | ORAL | Status: DC
  Administered 2020-01-20 – 2020-01-22 (×3): 10 g via ORAL
  Filled 2020-01-20 (×3): qty 15

## 2020-01-20 NOTE — Progress Notes (Signed)
Subjective:   [name] states that her abdomen feels significantly distended today with mild, diffuse pain and tightness. She endorses mild shortness of breath only with exertion but states she is still able to get up and walk around okay. She states she had 5 soft bowel movements a couple days ago, but 2 yesterday and 2 overnight. She does also note increased lower extremity swelling and diffuse itching, worst at the sites of her IV tape. She denies any nausea, vomiting, fevers, chills, light-headedness, dizziness, or any other symptoms.   Objective:  Vital signs in last 24 hours: Vitals:   01/19/20 1956 01/20/20 0606 01/20/20 0622 01/20/20 1236  BP: 90/61 (!) 99/56  (!) 93/56  Pulse: 74 75  73  Resp: 18 17  16   Temp: 98.2 F (36.8 C) (!) 97.4 F (36.3 C)  98.3 F (36.8 C)  TempSrc: Oral Oral  Oral  SpO2: 98% 100%  100%  Weight:   66 kg   Height:       General: Patient appears chronically ill but in no acute distress. Respiratory: There are crackles present with decreased lung sounds at the bilateral bases. No wheezing or rhonchi.  Cardiovascular: Regular rate and rhythm. No murmurs, rubs, or gallops. There is bilateral 1+ pitting edema to just above the knees.  Abdominal: There is significant abdominal distention with fluid wave. There is moderate abdominal tenderness, worst in the LLQ and RUQ. There is mild tenderness and erythema overlying reducible umbilical hernia site. Bowel sounds hyperactive.  Musculoskeletal: Upper extremities cachectic.  Skin: Skin appears mildly jaundiced. There are purpura along the forearms and chest wall without active bleeding of the skin. Psych: Normal affect. Normal tone of voice.   Assessment/Plan:  Principal Problem:   SBP (spontaneous bacterial peritonitis) ([place]) Active Problems:   Thrombocytopenia ([place])   Type 2 diabetes mellitus with other specified complication ([place])   Cirrhosis ([place])   Portal hypertension ([place])   Angiodysplasia  of upper gastrointestinal tract  # Refractory, Decompensated Cirrhosis s/p TIPS 9/14 2/2 likely NAFLD, Complicated by Resolving SBP (E. Coli 01/07/20), recurrent large volume ascites,  esophageal varices s/p banding, and thrombocytopenia. EGD 01/16/20 showed Grade I small varices with no stigmata of bleeding. Patient tolerated TIPS without complication and has not had encephalopathy in the hospital. However, has had increasing pain at umbilical hernia site (reproducible).  - GI and IR on board, appreciate their recommendations - Patient will receive repeat diagnostic and therapeutic paracentesis today with cell count and culture with albumin - Will continue IV cefazolin (Day 13/14) - Will need secondary prophylaxis with ciprofloxacin 554m daily to prevent recurrent SBP - Monitor BP and continue avoiding fluids - Continue Lactulose  - conservative management only for umbilical hernia given patient is poor surgical candidate   # Improving Normocytic Anemia Hemoglobin continues to improve. Iron panel shows low iron 27, normal ferritin 121, and saturation ratios unable to be calculated. No active bleeding. - Continue to monitor daily CBC's - Will hold on iron replacement therapy given active infection; consider therapy vs. repeat saturation ratios once SBP resolves  # AKI, Resolved # Non-anion gap Metabolic Acidosis, Resolved Creatinine initially 1.72. BUN 41, GFR 37. Bicarb remains low. Creatinine normalized today. 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  - Continue to monitor creatinine   # Diet-Controlled Type II DM Patient had recently been taken off Metformin due to low Hgb A1c of 5.2. Elevated  sugars s/p tips though improving, likely reactive.  - continue CBG q8 hours for now - May initiate SSI if sugars continue to be elevated   # Hypotension Blood pressures remain stable in 52'E systolic. - Will  attempt to avoid fluids as much as possible given cirrhosis - monitor post-paracentesis  # Hyperkalemia, Resolved - Potassium remains borderline elevated. Likely due to RTA type IV, continue to monitor  Prior to discharge: Home Anticipated discharge to: Home Barriers to discharge: paracentesis  Anticipated discharge: Today vs. Lanier Ensign, MD 01/20/2020, 1:59 PM Pager: [phone] After 5pm on weekdays and 1pm on weekends: On Call pager [phone]

## 2020-01-20 NOTE — Progress Notes (Addendum)
Daily Rounding Note  01/20/2020, 10:04 AM  LOS: 12 days   SUBJECTIVE:   Chief complaint:  NASH cirrhosis.  Ascites, s/p TIPS.       Generalized abd pain persists.  Paracentesis ordered for today.  ~ 5 BM's, some loose, yesterday.  Not much activity, just walking in the room.  Denies SOB  OBJECTIVE:         Vital signs in last 24 hours:    Temp:  [97.4 F (36.3 C)-98.2 F (36.8 C)] 97.4 F (36.3 C) (09/20 0606) Pulse Rate:  [74-85] 75 (09/20 0606) Resp:  [17-18] 17 (09/20 0606) BP: (90-99)/(56-61) 99/56 (09/20 0606) SpO2:  [98 %-100 %] 100 % (09/20 0606) Weight:  [66 kg] 66 kg (09/20 0622) Last BM Date: 01/19/20 Filed Weights   01/18/20 0500 01/19/20 0500 01/20/20 0622  Weight: 64.8 kg 67 kg 66 kg   General: looks the same.  Somewhat ill and uncomfortable.  Calm and alert   Heart: RRR Chest: clear bil.   Abdomen: soft, tender, umbilical hernia reduced.  BS present Extremities: slight LE edema w/o pitting Neuro/Psych:  Oriented x 3.  Alert.  Fluid speech.     Intake/Output from previous day: 09/19 0701 - 09/20 0700 In: 1614.7 [P.O.:1170; I.V.:175.1; IV Piggyback:269.6] Out: -   Intake/Output this shift: Total I/O In: 120 [P.O.:120] Out: -   Lab Results: Recent Labs    01/18/20 0404 01/19/20 0957 01/20/20 0052  WBC 9.5 7.9 7.9  HGB 9.1* 9.3* 9.9*  HCT 27.9* 28.1* 30.3*  PLT 100* 97* 110*   BMET Recent Labs    01/18/20 0404 01/19/20 0957 01/20/20 0052  NA 130* 131* 133*  K 4.7 4.6 5.0  CL 105 104 103  CO2 19* 19* 24  GLUCOSE 148* 231* 134*  BUN 26* 24* 22*  CREATININE 1.11* 1.08* 0.96  CALCIUM 8.3* 8.0* 8.5*   LFT Recent Labs    01/18/20 0404 01/19/20 0957 01/20/20 0052  PROT 4.6* 4.6* 4.9*  ALBUMIN 2.5* 2.4* 2.6*  AST 90* 50* 52*  ALT 23 12 12   ALKPHOS 107 108 118  BILITOT 2.1* 2.5* 2.2*   PT/INR No results for input(s): LABPROT, INR in the last 72 hours. Hepatitis Panel No  results for input(s): HEPBSAG, HCVAB, HEPAIGM, HEPBIGM in the last 72 hours.  Studies/Results: No results found.   Scheduled Meds: . feeding supplement (ENSURE ENLIVE)  237 mL Oral BID BM  . lactulose  10 g Oral Daily  . multivitamin with minerals  1 tablet Oral Daily  . pantoprazole  40 mg Oral Q0600  . ramelteon  8 mg Oral QHS   Continuous Infusions: .  ceFAZolin (ANCEF) IV Stopped (01/20/20 3491)  . lactated ringers Stopped (01/19/20 2308)   PRN Meds:.acetaminophen, lidocaine   ASSESMENT:   *   Refractory ascites, s/pTIPS (actually a "DIPS") post resolution of SBP Day 12 Ancef.    Weight 67 >> 66 kg in last 24 hours.   BPs remain low 90s/50s.   Paracentesis w fluid studies ordered but not completed.    *   NASH cirrhosis.     *   Small, non-bleeding esoph varices per 9/16 EGD.    *   [place] anemia.     *    Hyponatremia.   Improved.   2 gm Na diet in place.     PLAN   *   I ordered IV albumin to be given after para. Could  probably convert from IV to po abx, wait on fluid studies before final decision. Agree w dropping dose Miralax to 10 gm/daily.          Meagan Peck  01/20/2020, 10:04 AM Phone [phone]

## 2020-01-20 NOTE — Procedures (Signed)
PROCEDURE SUMMARY:  Successful US guided paracentesis from left abdomen.  Yielded 3.6 l of light red fluid.  No immediate complications.  Pt tolerated well.   Specimen sent for labs.  EBL < 20m  Winola Drum R Jerzi Tigert, NP 01/20/2020 12:27 PM

## 2020-01-20 NOTE — Progress Notes (Signed)
Physical Therapy Treatment Patient Details Name: Meagan Peck MRN: [id number] DOB: 1960/08/01 Today's Date: 01/20/2020    History of Present Illness 59 year old female with a history of diabetes, hypertension, and recently diagnosed cirrhosis with weekly paracentesis presenting with abdominal pain and a recent paracentesis that showed findings concerning for SBP.  Patient was contacted by her GI doctor and advised to come into the ED for further work-up and management of the SBP. Patient reports that since Monday, 01/06/2020, she has been having some feelings of shakiness and cold, right-sided abdominal pain, mildly decreased appetite, and abdominal bloating.  Also endorses some headaches. Pt underwent US guided paracentesis from RLQ on 9/10 and 9/13. Pt then underwent TIPs procedure on 9/14. Paracentesis 9/20.    PT Comments    Pt supine on arrival, agreeable to therapy session, with good participation and tolerance for session. Pt presents with decreased activity tolerance, decreased balance, and mild pain. Pt performed bed mobility and transfers with Supervision and progressed gait to 380ft using SPC and min Guard assist at most, no loss of balance. Pt slow to perform functional mobility tasks however careful and with good safety awareness. Will continue to follow to progress BLE strength/endurance and progress to home. Discharge recs below remain appropriate at this time.   Follow Up Recommendations  Home health PT;Supervision - Intermittent     Equipment Recommendations  None recommended by PT ((pt already owns [facility]))       Precautions / Restrictions Precautions Precautions: Fall Restrictions Weight Bearing Restrictions: No    Mobility  Bed Mobility Overal bed mobility: Needs Assistance Bed Mobility: Supine to Sit;Sit to Supine     Supine to sit: Supervision Sit to supine: Supervision      Transfers Overall transfer level: Needs assistance Equipment used: Straight cane  Transfers: Sit to/from Stand Sit to Stand: Supervision         General transfer comment: no assist to steady, good use of UE, increased time to perform  Ambulation/Gait Ambulation/Gait assistance: Min guard Gait Distance (Feet): 300 Feet Assistive device: Straight cane Gait Pattern/deviations: Step-through pattern;Decreased stride length;Shuffle     General Gait Details: very short steps with slowed gait, but no LOB.    Balance Overall balance assessment: Needs assistance Sitting-balance support: No upper extremity supported;Feet supported Sitting balance-Leahy Scale: Good     Standing balance support: Single extremity supported;Bilateral upper extremity supported Standing balance-Leahy Scale: Poor Standing balance comment: reliant on single UE support for static/dynamic balance tasks       Cognition Arousal/Alertness: Awake/alert Behavior During Therapy: WFL for tasks assessed/performed Overall Cognitive Status: Within Functional Limits for tasks assessed       Exercises      General Comments General comments (skin integrity, edema, etc.): VSS on RA, bruising to RUE proximal to distal (pt reports this has been present multiple days)      Pertinent Vitals/Pain Pain Assessment: Faces Faces Pain Scale: Hurts a little bit Pain Location: mild abd soreness and mild discomfort at previous IV site (infiltrated) Pain Descriptors / Indicators: Grimacing;Sore Pain Intervention(s): Limited activity within patient's tolerance;Monitored during session    PT Goals (current goals can now be found in the care plan section) Acute Rehab PT Goals Potential to Achieve Goals: Good Progress towards PT goals: Progressing toward goals    Frequency    Min 3X/week      PT Plan Current plan remains appropriate   AM-PAC PT "6 Clicks" Mobility   Outcome Measure  Help needed turning from your  back to your side while in a flat bed without using bedrails?: None Help needed moving  from lying on your back to sitting on the side of a flat bed without using bedrails?: None Help needed moving to and from a bed to a chair (including a wheelchair)?: A Little Help needed standing up from a chair using your arms (e.g., wheelchair or bedside chair)?: A Little Help needed to walk in hospital room?: A Little Help needed climbing 3-5 steps with a railing? : A Little 6 Click Score: 20    End of Session Equipment Utilized During Treatment: Gait belt Activity Tolerance: Patient tolerated treatment well Patient left: in chair;with call bell/phone within reach Nurse Communication: Mobility status PT Visit Diagnosis: Other abnormalities of gait and mobility (R26.89);Unsteadiness on feet (R26.81);Muscle weakness (generalized) (M62.81)     Time: 8338-2505 PT Time Calculation (min) (ACUTE ONLY): 20 min  Charges:  $Gait Training: 8-22 mins                     Sarajane Fambrough P., PTA Acute Rehabilitation Services Pager: not currently functional, please message via Secure Chat in Epic Office: [place] 01/20/2020, 3:01 PM

## 2020-01-21 ENCOUNTER — Inpatient Hospital Stay (HOSPITAL_COMMUNITY): Payer: No Typology Code available for payment source

## 2020-01-21 ENCOUNTER — Ambulatory Visit (HOSPITAL_COMMUNITY): Admit: 2020-01-21 | Payer: No Typology Code available for payment source

## 2020-01-21 DIAGNOSIS — R601 Generalized edema: Secondary | ICD-10-CM

## 2020-01-21 DIAGNOSIS — R1084 Generalized abdominal pain: Secondary | ICD-10-CM

## 2020-01-21 LAB — CBC
HCT: 28.6 % — ABNORMAL LOW (ref 36.0–46.0)
Hemoglobin: 9.8 g/dL — ABNORMAL LOW (ref 12.0–15.0)
MCH: 31.3 pg (ref 26.0–34.0)
MCHC: 34.3 g/dL (ref 30.0–36.0)
MCV: 91.4 fL (ref 80.0–100.0)
Platelets: 110 10*3/uL — ABNORMAL LOW (ref 150–400)
RBC: 3.13 MIL/uL — ABNORMAL LOW (ref 3.87–5.11)
RDW: 15.7 % — ABNORMAL HIGH (ref 11.5–15.5)
WBC: 7.5 10*3/uL (ref 4.0–10.5)
nRBC: 0 % (ref 0.0–0.2)

## 2020-01-21 LAB — COMPREHENSIVE METABOLIC PANEL
ALT: 10 U/L (ref 0–44)
AST: 43 U/L — ABNORMAL HIGH (ref 15–41)
Albumin: 2.5 g/dL — ABNORMAL LOW (ref 3.5–5.0)
Alkaline Phosphatase: 107 U/L (ref 38–126)
Anion gap: 7 (ref 5–15)
BUN: 17 mg/dL (ref 6–20)
CO2: 21 mmol/L — ABNORMAL LOW (ref 22–32)
Calcium: 8.1 mg/dL — ABNORMAL LOW (ref 8.9–10.3)
Chloride: 102 mmol/L (ref 98–111)
Creatinine, Ser: 0.91 mg/dL (ref 0.44–1.00)
GFR calc Af Amer: >60 mL/min (ref >60)
GFR calc non Af Amer: >60 mL/min (ref >60)
Glucose, Bld: 128 mg/dL — ABNORMAL HIGH (ref 70–99)
Potassium: 5.2 mmol/L — ABNORMAL HIGH (ref 3.5–5.1)
Sodium: 130 mmol/L — ABNORMAL LOW (ref 135–145)
Total Bilirubin: 2.5 mg/dL — ABNORMAL HIGH (ref 0.3–1.2)
Total Protein: 4.7 g/dL — ABNORMAL LOW (ref 6.5–8.1)

## 2020-01-21 LAB — GLUCOSE, CAPILLARY
Glucose-Capillary: 79 mg/dL (ref 70–99)
Glucose-Capillary: 99 mg/dL (ref 70–99)
Glucose-Capillary: 144 mg/dL — ABNORMAL HIGH (ref 70–99)

## 2020-01-21 MED ORDER — ONDANSETRON HCL 4 MG PO TABS: 4.0000 mg | ORAL_TABLET | Freq: Once | ORAL | Status: AC

## 2020-01-21 MED ORDER — ONDANSETRON HCL 4 MG/2ML IJ SOLN
4.0000 mg | Freq: Once | INTRAMUSCULAR | Status: AC
  Administered 2020-01-21: 4 mg via INTRAVENOUS
  Filled 2020-01-21: qty 2

## 2020-01-21 MED ORDER — CIPROFLOXACIN HCL 500 MG PO TABS
500.0000 mg | ORAL_TABLET | Freq: Every day | ORAL | Status: DC
  Administered 2020-01-22: 500 mg via ORAL
  Filled 2020-01-21: qty 1

## 2020-01-21 NOTE — Progress Notes (Signed)
Subjective:   Ms. Ruppel states she "had a rough night". She states her abdominal pain has gotten significantly worse, worse with deep breathing and movement in bed. She describes it as dull and achy. She endorses decreased appetite, but denies nausea and vomiting. She denies any light-headedness, dizziness, or other symptoms at this time. Denies fevers or chills.    Objective:  Vital signs in last 24 hours: Vitals:   01/20/20 1445 01/20/20 2007 01/21/20 0407 01/21/20 1329  BP:  (!) 96/55 (!) 91/55 (!) 104/54  Pulse: 86 84 82 85  Resp:  16 17 18   Temp:  98.1 F (36.7 C) 98.2 F (36.8 C) 98.3 F (36.8 C)  TempSrc:  Oral Oral Oral  SpO2: 100% 99% 99% 100%  Weight:      Height:       General: Patient appears chronically ill and uncomfortable, but in no acute distress. Respiratory: There are crackles present with decreased lung sounds at the bilateral bases. No wheezing or rhonchi.  Cardiovascular: Regular rate and rhythm. No murmurs, rubs, or gallops. There is bilateral 2+ pitting edema that extends above the knees, increased since yesterday. Abdominal: There is more prominent abdominal distention. There is severe abdominal tenderness with guarding and rebound, worst in the RUQ and LLQ. Bowel sounds intact.  Musculoskeletal: Upper extremities cachectic.  Skin: Skin appears mildly jaundiced. There are purpura along the forearms without active bleeding of the skin. No obvious signs of retroperitoneal hematoma.  Psych: limited affect, normal tone of voice.   Assessment/Plan:  Principal Problem:   SBP (spontaneous bacterial peritonitis) ([place]) Active Problems:   Thrombocytopenia ([place])   Type 2 diabetes mellitus with other specified complication ([place])   Cirrhosis ([place])   Portal hypertension ([place])   Angiodysplasia of upper gastrointestinal tract   NASH (nonalcoholic steatohepatitis)  # Refractory, Decompensated Cirrhosis s/p TIPS 9/14 2/2 likely NAFLD, Complicated by Resolving  SBP (E. Coli 01/07/20), recurrent large volume ascites, hx of esophageal varices s/p banding, and stable thrombocytopenia. EGD 01/16/20 showed Grade I small varices with no stigmata of bleeding. Patient tolerated TIPS and has not had encephalopathy in the hospital, now on lactulose daily. Repeat diagnostic/therapeutic paracentesis performed yesterday had 168 total nucleated cells with 1% neutrophils and no organisms on gram stain. However, she had acute worsening of abdominal pain today with guarding of the RUQ and rebound tenderness. Bowel sounds intact.  - GI and IR on board, appreciate their recommendations - Will check repeat CT Abdomen / Pelvis with Contrast to r/o post-surgical complication - Patient will finished day 14/14 IV ABX today (cefazolin)  - Started on ciprofloxacin 526m daily for secondary SBP prophylaxis  - Monitor BP and continue avoiding fluids - Continue Lactulose  - conservative management only for umbilical hernia given patient is poor surgical candidate   # Stable Normocytic Anemia Hemoglobin remains stable. Iron panel shows low iron 27, normal ferritin 121, and saturation ratios unable to be calculated. No active bleeding. - Continue to monitor daily CBC's - Will hold on iron replacement therapy given active infection; consider therapy vs. repeat saturation ratios once SBP resolves  # AKI, Resolved # Recurrent Non-anion gap Metabolic Acidosis # Recurrent Hyperkalemia Creatinine initially 1.72. BUN 41, GFR 37. Bicarb remains low. Creatinine WNL. K+ 5.2. Likely due to type IV RTA; however, even off diuretic therapy she continues to have [place]  - Continue to monitor creatinine  - Will consider starting Lasix 262mdaily during admission when stabilized vs. Upon discharge if close follow up  is established  - [employer] for now  # Diet-Controlled Type II DM Patient had recently been taken off Metformin due to low Hgb A1c of 5.2. Elevated sugars s/p tips though improving,  likely reactive.  - continue CBG q8 hours for now - May initiate SSI if sugars continue to be elevated   # Hypotension Blood pressures remain stable. - Will attempt to avoid fluids as much as possible given cirrhosis  Prior to discharge: Home Anticipated discharge to: Home Barriers to discharge: Acute Abdominal pain, Pending CT scan  Jeralyn Bennett, MD 01/21/2020, 5:08 PM Pager: [phone] After 5pm on weekdays and 1pm on weekends: On Call pager [phone]

## 2020-01-21 NOTE — Progress Notes (Signed)
Nutrition Follow-up  RD working remotely.  DOCUMENTATION CODES:   Not applicable  INTERVENTION:   -D/c Ensure Enlive po BID, each supplement provides 350 kcal and 20 grams of protein -Magic cup TID with meals, each supplement provides 290 kcal and 9 grams of protein -Continue MVI with minerals daily  NUTRITION DIAGNOSIS:   Increased nutrient needs related to chronic illness (cirrhosis) as evidenced by estimated needs.  Ongoing  GOAL:   Patient will meet greater than or equal to 90% of their needs  Progressing   MONITOR:   PO intake, Supplement acceptance, Labs, Weight trends, Skin, I & O's  REASON FOR ASSESSMENT:   Malnutrition Screening Tool    ASSESSMENT:   59 year old female who has a significant past medical history of decompensated cirrhosis that was diagnosed in March of this year with refractory ascites.  Her recent past medical history is significant for hyperkalemia with diuretic therapy for her ascites and subsequent need for weekly paracentesis.  9/10- s/p RLQ paracentesis (1 L clear amber fluid removed) 9/13- s/p rt paracentesis (4.8 L cloudy pink fluid removed) 9/14- s/p US guided percutaneous portal vein access; US guided right IJ access; "gunsight TIPS" with percutaneous 21g chiba, connecting IVC to the main portal via 53m x 166mviatorr; Coil embolization of percutaneous portal vein access. 9/20- s/p paracentesis of lt abdomen (3.4 L removed)  Reviewed I/O's: +670 ml x 24 hours and +1.6 L since admission  Per MD notes, pt with increased abdominal pain. Discharge has been held and CT has been ordered.   Attempted to speak with pt via call to hospital room phone, however, unable to reach.   Pt remains with good appetite; noted meal completion 50-100%. She is refusing Ensure Enlive supplements.   Medications reviewed and include lactulose.  Labs reviewed: CBGS: 99-144.   Diet Order:   Diet Order            Diet 2 gram sodium Room service  appropriate? Yes; Fluid consistency: Thin  Diet effective now                 EDUCATION NEEDS:   Education needs have been addressed  Skin:  Skin Assessment: Skin Integrity Issues: Skin Integrity Issues:: Incisions Incisions: rt neck, rt flank, abdomen  Last BM:  01/13/20  Height:   Ht Readings from Last 1 Encounters:  01/17/20 4' 10"  (1.473 m)    Weight:   Wt Readings from Last 1 Encounters:  01/20/20 66 kg    Ideal Body Weight:  44.1 kg  BMI:  Body mass index is 30.43 kg/m.  Estimated Nutritional Needs:   Kcal:  1550-1750  Protein:  85-100 grams  Fluid:  > 1.5 L    Meagan ChanceRD, LDN, CDClifton Heightsegistered Dietitian II Certified Diabetes Care and Education Specialist Please refer to AMThree Rivers Hospitalor RD and/or RD on-call/weekend/after hours pager

## 2020-01-21 NOTE — Plan of Care (Signed)

## 2020-01-21 NOTE — Progress Notes (Signed)
Daily Rounding Note  01/21/2020, 10:31 AM  LOS: 13 days   SUBJECTIVE:   Chief complaint:  Meagan Peck cirrhosis, ascites, c/p TIPS.      Overall does not feel well.  Diffuse abd pain persists despite paracentesis.   Pain present at rest, worse w movement.  Anorexia but no N/V.  3 BM's yest, soft, brown.  2 BM today  OBJECTIVE:         Vital signs in last 24 hours:    Temp:  [98.1 F (36.7 C)-98.3 F (36.8 C)] 98.2 F (36.8 C) (09/21 0407) Pulse Rate:  [73-86] 82 (09/21 0407) Resp:  [16-17] 17 (09/21 0407) BP: (91-96)/(55-56) 91/55 (09/21 0407) SpO2:  [99 %-100 %] 99 % (09/21 0407) Last BM Date: 01/20/20 Filed Weights   01/18/20 0500 01/19/20 0500 01/20/20 0622  Weight: 64.8 kg 67 kg 66 kg   General: looks chron and somewhat acutely ill.  Looks malnourished.   Heart: RRR Chest: clear bil.  No dyspnea Abdomen: soft, obese, ND.  Active BS.  difffuse tenderness  W/o G/R.  Reduced umbilical hernia.    Extremities: sarcopenia.  Non-pitting LE edema.   Neuro/Psych:  No asterixis or confusion.  Alert, good historian, fluid speech.    Intake/Output from previous day: 09/20 0701 - 09/21 0700 In: 670 [P.O.:470; IV Piggyback:200] Out: -   Intake/Output this shift: No intake/output data recorded.  Lab Results: Recent Labs    01/19/20 0957 01/20/20 0052 01/21/20 0058  WBC 7.9 7.9 7.5  HGB 9.3* 9.9* 9.8*  HCT 28.1* 30.3* 28.6*  PLT 97* 110* 110*   BMET Recent Labs    01/19/20 0957 01/20/20 0052 01/21/20 0058  NA 131* 133* 130*  K 4.6 5.0 5.2*  CL 104 103 102  CO2 19* 24 21*  GLUCOSE 231* 134* 128*  BUN 24* 22* 17  CREATININE 1.08* 0.96 0.91  CALCIUM 8.0* 8.5* 8.1*   LFT Recent Labs    01/19/20 0957 01/20/20 0052 01/21/20 0058  PROT 4.6* 4.9* 4.7*  ALBUMIN 2.4* 2.6* 2.5*  AST 50* 52* 43*  ALT 12 12 10   ALKPHOS 108 118 107  BILITOT 2.5* 2.2* 2.5*   PT/INR No results for input(s): LABPROT, INR in the  last 72 hours. Hepatitis Panel No results for input(s): HEPBSAG, HCVAB, HEPAIGM, HEPBIGM in the last 72 hours.  Studies/Results: IR Paracentesis  Result Date: 01/20/2020 INDICATION: Patient with cirrhosis, s/p TIPS, continues to have recurrent ascites. Interventional radiology asked to perform a therapeutic and diagnostic paracentesis. EXAM: ULTRASOUND GUIDED PARACENTESIS MEDICATIONS: 1% lidocaine 10 mL COMPLICATIONS: None immediate. PROCEDURE: Informed written consent was obtained from the patient after a discussion of the risks, benefits and alternatives to treatment. A timeout was performed prior to the initiation of the procedure. Initial ultrasound scanning demonstrates a large amount of ascites within the left lower abdominal quadrant. The left lower abdomen was prepped and draped in the usual sterile fashion. 1% lidocaine was used for local anesthesia. Following this, a 19 gauge, 7-cm, Yueh catheter was introduced. An ultrasound image was saved for documentation purposes. The paracentesis was performed. The catheter was removed and a dressing was applied. The patient tolerated the procedure well without immediate post procedural complication. FINDINGS: A total of approximately 3.6 L of light red fluid was removed. Samples were sent to the laboratory as requested by the clinical team. IMPRESSION: Successful ultrasound-guided paracentesis yielding 3.6 liters of peritoneal fluid. Read by: Soyla Dryer, NP Electronically Signed  By: Aletta Edouard M.D.   On: 01/20/2020 12:30   Scheduled Meds: . [START ON 01/22/2020] ciprofloxacin  500 mg Oral Daily  . feeding supplement (ENSURE ENLIVE)  237 mL Oral BID BM  . lactulose  10 g Oral Daily  . multivitamin with minerals  1 tablet Oral Daily  . pantoprazole  40 mg Oral Q0600  . ramelteon  8 mg Oral QHS   Continuous Infusions: .  ceFAZolin (ANCEF) IV 2 g (01/21/20 0908)  . lactated ringers Stopped (01/19/20 2308)   PRN Meds:.acetaminophen,  lidocaine   ASSESMENT:   *   Refractory ascites, s/pTIPS 9/14 (actually a "DIPS") post resolution of SBP (dx on 9/7, grew E coli) Organism pan sensitive.  Day 14 abx.  No fever.  No leukocytosis.   Rocephin 9/8 - 9/13.  Ancef 9/14 >> present.  BPs consistently 90s/50s.   01/20/20 Paracentesis 3.6 L fluid.  nucleated cells 168, neutrophils 1% c/w continued resolution of SBP   Abdominal pain persists, Rx w occasional APAP.    *   NASH cirrhosis.    She notes that [facility] is not in network for transplant.    *   Small, non-bleeding esoph varices per 9/16 EGD.    *   [place] anemia.  Hgb stable.    Thrombocytopenia, improved, non-critical.    *    Hyponatremia.  2 gm Na diet in place.    *    DM2.  Improved.  No meds, no insulin in place P ta or currently but consistently elevated A1C over last 2.5 years.    *    PCM.  Anorexia, not eating much protein.  Ensure bid, MVI in place. RD note in progress.        PLAN   *  Stop Ancef after today, start Cipro 500 mg po after for SBP prophylaxis.   *   Encouraged pt to add more protein to her diet choices.       Azucena Freed  01/21/2020, 10:31 AM Phone [phone]

## 2020-01-22 ENCOUNTER — Encounter (HOSPITAL_COMMUNITY): Payer: Self-pay

## 2020-01-22 DIAGNOSIS — K7581 Nonalcoholic steatohepatitis (NASH): Secondary | ICD-10-CM

## 2020-01-22 LAB — COMPREHENSIVE METABOLIC PANEL
ALT: 9 U/L (ref 0–44)
AST: 48 U/L — ABNORMAL HIGH (ref 15–41)
Albumin: 2.4 g/dL — ABNORMAL LOW (ref 3.5–5.0)
Alkaline Phosphatase: 108 U/L (ref 38–126)
Anion gap: 6 (ref 5–15)
BUN: 15 mg/dL (ref 6–20)
CO2: 21 mmol/L — ABNORMAL LOW (ref 22–32)
Calcium: 8 mg/dL — ABNORMAL LOW (ref 8.9–10.3)
Chloride: 100 mmol/L (ref 98–111)
Creatinine, Ser: 0.91 mg/dL (ref 0.44–1.00)
GFR calc Af Amer: >60 mL/min (ref >60)
GFR calc non Af Amer: >60 mL/min (ref >60)
Glucose, Bld: 105 mg/dL — ABNORMAL HIGH (ref 70–99)
Potassium: 5.4 mmol/L — ABNORMAL HIGH (ref 3.5–5.1)
Sodium: 127 mmol/L — ABNORMAL LOW (ref 135–145)
Total Bilirubin: 2.7 mg/dL — ABNORMAL HIGH (ref 0.3–1.2)
Total Protein: 4.6 g/dL — ABNORMAL LOW (ref 6.5–8.1)

## 2020-01-22 LAB — CBC
HCT: 27.1 % — ABNORMAL LOW (ref 36.0–46.0)
Hemoglobin: 9 g/dL — ABNORMAL LOW (ref 12.0–15.0)
MCH: 29.7 pg (ref 26.0–34.0)
MCHC: 33.2 g/dL (ref 30.0–36.0)
MCV: 89.4 fL (ref 80.0–100.0)
Platelets: 104 10*3/uL — ABNORMAL LOW (ref 150–400)
RBC: 3.03 MIL/uL — ABNORMAL LOW (ref 3.87–5.11)
RDW: 15.9 % — ABNORMAL HIGH (ref 11.5–15.5)
WBC: 6.1 10*3/uL (ref 4.0–10.5)
nRBC: 0 % (ref 0.0–0.2)

## 2020-01-22 LAB — GLUCOSE, CAPILLARY: Glucose-Capillary: 176 mg/dL — ABNORMAL HIGH (ref 70–99)

## 2020-01-22 LAB — PATHOLOGIST SMEAR REVIEW

## 2020-01-22 MED ORDER — CIPROFLOXACIN HCL 500 MG PO TABS: 500.0000 mg | ORAL_TABLET | Freq: Every day | ORAL | 0 refills | Status: DC

## 2020-01-22 MED ORDER — SODIUM ZIRCONIUM CYCLOSILICATE 10 G PO PACK
10.0000 g | PACK | Freq: Once | ORAL | Status: DC
  Administered 2020-01-22: 10 g via ORAL

## 2020-01-22 MED ORDER — SODIUM CHLORIDE 0.9% FLUSH: 10.0000 mL | Freq: Two times a day (BID) | INTRAVENOUS | Status: DC

## 2020-01-22 MED ORDER — LACTULOSE 10 GM/15ML PO SOLN: 10.0000 g | Freq: Every day | ORAL | 0 refills | Status: DC

## 2020-01-22 MED ORDER — FUROSEMIDE 20 MG PO TABS
20.0000 mg | ORAL_TABLET | ORAL | 0 refills | Status: DC
Start: 2020-01-22 — End: 2020-02-14

## 2020-01-22 MED ORDER — SODIUM CHLORIDE 0.9% FLUSH: 10.0000 mL | INTRAVENOUS | Status: DC | PRN

## 2020-01-22 MED ORDER — PANTOPRAZOLE SODIUM 40 MG PO TBEC: 40.0000 mg | DELAYED_RELEASE_TABLET | Freq: Every day | ORAL | 0 refills | Status: DC

## 2020-01-22 MED ORDER — FUROSEMIDE 20 MG PO TABS
20.0000 mg | ORAL_TABLET | ORAL | Status: DC
  Administered 2020-01-22: 20 mg via ORAL
  Filled 2020-01-22: qty 1

## 2020-01-22 MED FILL — CIPROFLOXACIN HCL 500 MG TA: 500 | 30 days supply | Qty: 30 | Fill #0

## 2020-01-22 MED FILL — GENERLAC 10 GM/15 ML SOLN: 10 | 30 days supply | Qty: 473 | Fill #0

## 2020-01-22 MED FILL — FUROSEMIDE 20 MG TAB: 20 | 30 days supply | Qty: 15 | Fill #0

## 2020-01-22 MED FILL — PANTOPRAZOLE SOD DR 40 MG T: 40 | 30 days supply | Qty: 30 | Fill #0

## 2020-01-22 NOTE — Progress Notes (Signed)
Subjective:   Patient states that her abdominal pain is significantly better today, located only in her epigastric area, worse upon standing up. Her diffuse abdominal pain from yesterday has resolved and she notes the pain around her hernia site has improved. States her abdominal swelling and lower extremity swelling have remained unchanged from yesterday. She does not feel like she is drinking excessive amount of fluids. She reports about 3 bowel movements per day, which are normal in consistency and non-bloody. Denies any fevers, chills, CP or other symptoms.  Objective:  Vital signs in last 24 hours: Vitals:   01/21/20 1832 01/21/20 2016 01/22/20 0434 01/22/20 0600  BP: (!) 93/53 (!) 102/54 (!) 101/56   Pulse: 78 88 77   Resp: 20 16 17    Temp: 98.2 F (36.8 C) 98.3 F (36.8 C) 98.4 F (36.9 C)   TempSrc: Oral Oral Oral   SpO2: 100% 99% 98%   Weight:    (P) 65.2 kg  Height:       General: Patient appears chronically ill but resting comfortably in no acute distress. Respiratory: Lung sounds decreased in bilateral bases. No wheezing or rhonchi.  Cardiovascular: Regular rate and rhythm. No murmurs, rubs, or gallops. There is bilateral 2+ pitting edema that extends to the knees.  Abdominal: There is significant abdominal distention, unchanged from yesterday. There is moderate epigastric and RUQ abdominal TTP with more mild tenderness over umbilical hernia site. Umbilical hernia is reducible. Bowel sounds hyperactive.  Musculoskeletal: Upper extremities cachectic.  Skin: Skin appears mildly jaundiced. There are purpura along the forearms without active bleeding of the skin. Psych: Improved range of affect, normal tone of voice.  Assessment/Plan:  Principal Problem:   SBP (spontaneous bacterial peritonitis) ([place]) Active Problems:   Thrombocytopenia ([place])   Type 2 diabetes mellitus with other specified complication ([place])   Cirrhosis ([place])   Portal hypertension ([place])    Angiodysplasia of upper gastrointestinal tract   NASH (nonalcoholic steatohepatitis)  # Refractory, Decompensated Cirrhosis s/p TIPS 9/14 2/2 likely NAFLD, Complicated by Resolving SBP (E. Coli 01/07/20), recurrent large volume ascites, hx of esophageal varices s/p banding, and stable thrombocytopenia. EGD 01/16/20 showed Grade I small varices with no stigmata of bleeding. Patient tolerated TIPS and has not had encephalopathy in the hospital, now on lactulose daily. Most recent diagnostic/therapeutic paracentesis had 168 total nucleated cells with 1% neutrophils and no organisms on gram stain. Her acute abdominal pain yesterday has improved, CT abdomen/pelvis yesterday was reassuring (aside from significant pleural effusions which may require future thoracentesis), and she continues to have regular soft bowel movements on Lactulose.  - GI has signed off  - Patient is stable for discharge - Patient finishing IV cefazolin today and will start ciprofloxacin 530m daily upon discharge for secondary SBP prophylaxis  - Continue Lactulose 159mdaily - conservative management only for umbilical hernia given patient is poor surgical candidate   # Stable Normocytic Anemia Hemoglobin remains stable. Iron panel shows low iron 27, normal ferritin 121, and saturation ratios unable to be calculated. No active bleeding. - Patient may resume outpatient iron replacement therapy  # AKI, Resolved # Recurrent Non-anion gap Metabolic Acidosis # Recurrent Hyperkalemia Creatinine initially 1.72. BUN 41, GFR 37. Bicarb remains low. Creatinine WNL. Na 127, K+ 5.4. 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 - Will start on lasix 2026mvery other day to help hyperkalemia and improve diffuse edema  # Diet-Controlled Type II DM Patient had recently been  taken off Metformin due to low Hgb A1c of 5.2. Intermittent elevation in blood sugars s/p TIPS. May be partially reactive. - Discussed that patient  may require Insulins vs. empagliflozin as outpatient  - Likely avoid future metformin given cirrhosis  # Hypotension Blood pressures remain stable. - Continue to monitor   Prior to discharge: Home Anticipated discharge to: Home Barriers to discharge: None Anticipate discharge this afternoon/evening.  Jeralyn Bennett, MD 01/22/2020, 4:12 PM Pager: [phone] After 5pm on weekdays and 1pm on weekends: On Call pager [phone]

## 2020-01-22 NOTE — Plan of Care (Signed)

## 2020-01-22 NOTE — Care Management (Signed)
1556 01-22-20 Case Manager received a call from the physician stating that patient may need transportation home. Case Manager called the patient and she has transportation- husband and daughter will pick her up today. Patient has declined home health services at this time. Physician will place discharge order. No further needs from Case Manager at this time. Bethena Roys, RN,BSN Case Manager

## 2020-01-22 NOTE — Discharge Instructions (Signed)
Ascites  Ascites is a collection of too much fluid in the abdomen. Ascites can range from mild to severe. If ascites is not treated, it can get worse. What are the causes? This condition may be caused by:  A liver condition called cirrhosis. This is the most common cause of ascites.  Long-term (chronic) or alcoholic hepatitis.  Infection or inflammation in the abdomen.  Cancer in the abdomen.  Heart failure.  Kidney disease.  Inflammation of the pancreas.  Clots in the veins of the liver. What are the signs or symptoms? Symptoms of this condition include:  A feeling of fullness in the abdomen. This is common.  An increase in the size of the abdomen or waist.  Swelling in the legs.  Swelling of the scrotum (in men).  Difficulty breathing.  Pain in the abdomen.  Sudden weight gain. If the condition is mild, you may not have symptoms. How is this diagnosed? This condition is diagnosed based on your medical history and a physical exam. Your health care provider may order imaging tests, such as an ultrasound or CT scan of your abdomen. How is this treated? Treatment for this condition depends on the cause of the ascites. It may include:  Taking a pill to make you urinate. This is called a water pill (diuretic pill).  Strictly reducing your salt (sodium) intake. Salt can cause extra fluid to be kept (retained) in the body, and this makes ascites worse.  Having a procedure to remove fluid from your abdomen (paracentesis).  Having a procedure that connects two of the major veins within your liver and relieves pressure on your liver. This is called a TIPS procedure (transjugular intrahepatic portosystemic shunt procedure).  Placement of a drainage catheter (peritoneovenous shunt) to manage the extra fluid in the abdomen. Ascites may go away or improve when the condition that caused it is treated. Follow these instructions at home:  Keep track of your weight. To do this,  weigh yourself at the same time every day and write down your weight.  Keep track of how much you drink and any changes in how much or how often you urinate.  Follow any instructions that your health care provider gives you about how much to drink.  Try not to eat salty (high-sodium) foods.  Take over-the-counter and prescription medicines only as told by your health care provider.  Keep all follow-up visits as told by your health care provider. This is important.  Report any changes in your health to your health care provider, especially if you develop new symptoms or your symptoms get worse. Contact a health care provider if:  You gain more than 3 lb (1.36 kg) in 3 days.  Your waist size increases.  You have new swelling in your legs.  The swelling in your legs gets worse. Get help right away if:  You have a fever.  You are confused.  You have new or worsening breathing trouble.  You have new or worsening pain in your abdomen.  You have new or worsening swelling in the scrotum (in men). Summary  Ascites is a collection of too much fluid in the abdomen.  Ascites may be caused by various conditions, such as cirrhosis, hepatitis, cancer, or congestive heart failure.  Symptoms may include swelling of the abdomen and other areas due to extra fluid in the body.  Treatments may involve dietary changes, medicines, or procedures. This information is not intended to replace advice given to you by your health care  provider. Make sure you discuss any questions you have with your health care provider. Document Revised: 03/20/2018 Document Reviewed: 12/29/2016 Elsevier Patient Education  [place].

## 2020-01-22 NOTE — Progress Notes (Signed)
Daily Rounding Note  01/22/2020, 12:28 PM  LOS: 14 days   SUBJECTIVE:   Chief complaint: abd pain.  S/p TIPS .  ascites     Abdominal pain improved today still a little bit of nagging lingering discomfort but nothing severe. Tolerating solid food. 3 brown stools already today after receiving lactulose  OBJECTIVE:         Vital signs in last 24 hours:    Temp:  [98.2 F (36.8 C)-98.4 F (36.9 C)] 98.4 F (36.9 C) (09/22 0434) Pulse Rate:  [77-88] 77 (09/22 0434) Resp:  [16-20] 17 (09/22 0434) BP: (93-104)/(53-56) 101/56 (09/22 0434) SpO2:  [98 %-100 %] 98 % (09/22 0434) Weight:  [65.2 kg] (P) 65.2 kg (09/22 0600) Last BM Date: 01/21/20 Filed Weights   01/19/20 0500 01/20/20 0622 01/22/20 0600  Weight: 67 kg 66 kg (P) 65.2 kg   General: Comfortable, pleasant, looks chronically ill Heart: RRR Chest: Clear bilaterally Abdomen: Soft.  Minimal tenderness nonfocal.  No HSM, masses.  Active bowel sounds Extremities: Mild pedal edema without pitting Neuro/Psych: Pleasant, fully alert and oriented.  Fluid speech.  No asterixis or tremors.  Intake/Output from previous day: 09/21 0701 - 09/22 0700 In: 100 [IV Piggyback:100] Out: -   Intake/Output this shift: No intake/output data recorded.  Lab Results: Recent Labs    01/20/20 0052 01/21/20 0058 01/22/20 0429  WBC 7.9 7.5 6.1  HGB 9.9* 9.8* 9.0*  HCT 30.3* 28.6* 27.1*  PLT 110* 110* 104*   BMET Recent Labs    01/20/20 0052 01/21/20 0058 01/22/20 0429  NA 133* 130* 127*  K 5.0 5.2* 5.4*  CL 103 102 100  CO2 24 21* 21*  GLUCOSE 134* 128* 105*  BUN 22* 17 15  CREATININE 0.96 0.91 0.91  CALCIUM 8.5* 8.1* 8.0*   LFT Recent Labs    01/20/20 0052 01/21/20 0058 01/22/20 0429  PROT 4.9* 4.7* 4.6*  ALBUMIN 2.6* 2.5* 2.4*  AST 52* 43* 48*  ALT 12 10 9   ALKPHOS 118 107 108  BILITOT 2.2* 2.5* 2.7*   PT/INR No results for input(s): LABPROT, INR in  the last 72 hours. Hepatitis Panel No results for input(s): HEPBSAG, HCVAB, HEPAIGM, HEPBIGM in the last 72 hours.  Studies/Results: CT ABDOMEN PELVIS WO CONTRAST  Result Date: 01/21/2020 CLINICAL DATA:  Abdominal pain. EXAM: CT ABDOMEN AND PELVIS WITHOUT CONTRAST TECHNIQUE: Multidetector CT imaging of the abdomen and pelvis was performed following the standard protocol without IV contrast. COMPARISON:  None. FINDINGS: Lower chest: There are moderate to large bilateral pleural effusions.The heart size is normal. Hepatobiliary: The liver is cirrhotic. There is aDIPS in place, the patency of which cannot be determined on this study. There is a metallic coil in the right hepatic lobe likely representing tract embolization. Status post cholecystectomy.There is no biliary ductal dilation. Pancreas: Normal contours without ductal dilatation. No peripancreatic fluid collection. Spleen: The spleen is enlarged measuring approximately 15 cm craniocaudad. Adrenals/Urinary Tract: --Adrenal glands: Unremarkable. --Right kidney/ureter: No hydronephrosis or radiopaque kidney stones. --Left kidney/ureter: No hydronephrosis or radiopaque kidney stones. --Urinary bladder: Unremarkable. Stomach/Bowel: --Stomach/Duodenum: No hiatal hernia or other gastric abnormality. Normal duodenal course and caliber. --Small bowel: Unremarkable. --Colon: Unremarkable. --Appendix: Normal. Vascular/Lymphatic: Atherosclerotic calcification is present within the non-aneurysmal abdominal aorta, without hemodynamically significant stenosis. --No retroperitoneal lymphadenopathy. --No mesenteric lymphadenopathy. --No pelvic or inguinal lymphadenopathy. Reproductive: There is a fibroid uterus. Other: There is a moderate volume of abdominal ascites. There is anasarca.  There is a fluid in fat containing umbilical hernia. Musculoskeletal. No acute displaced fractures. IMPRESSION: 1. Moderate to large bilateral pleural effusions. 2. Cirrhosis with  stigmata of portal hypertension. The patient is now status post DIPS. The stent appears to be grossly well positioned. 3. Moderate volume abdominal ascites. 4. Anasarca. Aortic Atherosclerosis (ICD10-I70.0). Electronically Signed   By: Constance Holster M.D.   On: 01/21/2020 18:20    ASSESMENT:   *   Persistent abd pain.  This not relieved after paracentesis 2 days ago.  *Refractory ascites, s/pTIPS 9/14(actually a "DIPS")post resolution of SBP (dx on 9/7, grew E coli) Organism pan sensitive.  Day 14 abx.  No fever.  No leukocytosis.   Rocephin 9/8 - 9/13.  Ancef 9/14 >> 9/21.  Daily cipro for SBP prophylaxis bega9/22  BPs consistently 90s/50s.  01/20/20 Paracentesis 3.6 L fluid.  nucleated cells 168, neutrophils 1% c/w continued resolution of SBP   Abdominal pain persists, Rx w occasional APAP.    * NASH cirrhosis.  She notes that [facility] is not in network for transplant.    * Small, non-bleeding esoph varices per 9/16 EGD.   * [place] anemia. Hgb stable.   Thrombocytopenia, improved, non-critical.    * Hyponatremia.2 gm Na diet in place.   *    DM2.  Improved.  No meds, no insulin in place P ta or currently but consistently elevated A1C over last 2.5 years.    *    PCM.  Anorexia, not eating much protein.  Ensure bid, MVI in place. RD note in progress.    PLAN   *    GI signing off.  She has an appointment with PA Lemmon at GI office on August 21, details entered into the provider navigator discharge file.  *    Ciprofloxacin 500 mg p.o. daily.  Lactulose 10 g PO daily.  Protonix 40 mg p.o. daily    Meagan Peck  01/22/2020, 12:28 PM Phone [phone]

## 2020-01-24 ENCOUNTER — Other Ambulatory Visit: Payer: Self-pay | Admitting: *Deleted

## 2020-01-24 NOTE — Discharge Summary (Addendum)
Name: Meagan Peck MRN: [id number] DOB: 27-May-1960 59 y.o. PCP: [name], DO  Date of Admission: 01/07/2020  6:52 PM Date of Discharge: 01/22/2020 Attending Physician: Dr. Rebeca Alert Discharge Diagnosis: Principal Problem:   SBP (spontaneous bacterial peritonitis) ([place]) Active Problems:   Thrombocytopenia ([place])   Type 2 diabetes mellitus with other specified complication ([place])   Cirrhosis ([place])   Portal hypertension ([facility])   Angiodysplasia of upper gastrointestinal tract   NASH (nonalcoholic steatohepatitis)  1. Refractory, Decompensated Cirrhosis  2. NASH 3. Angiodysplasia of upper GI tract  4. Normocytic Anemia 5. Thrombocytopenia 6. TIIDM  7. Hypotension   Discharge Medications: Allergies as of 01/22/2020       Reactions   Penicillins Anaphylaxis   Happened as a young child   Atrovent [ipratropium] Palpitations   As per patient   Naproxen Rash   Quinine Derivatives Rash   Sulfa Antibiotics Rash        Medication List     STOP taking these medications    aspirin 81 MG tablet   mupirocin ointment 2 % Commonly known as: Bactroban       TAKE these medications    Accu-Chek FastClix Lancets Misc Check fsbs TID   cetirizine 10 MG tablet Commonly known as: ZYRTEC Take 1 tablet (10 mg total) by mouth daily. What changed:  when to take this reasons to take this   ciprofloxacin 500 MG tablet Commonly known as: CIPRO Take 1 tablet (500 mg total) by mouth daily.   ferrous sulfate 325 (65 FE) MG EC tablet Take 1 tablet (325 mg total) by mouth 2 (two) times daily with a meal.   furosemide 20 MG tablet Commonly known as: LASIX Take 1 tablet (20 mg total) by mouth every other day.   Krill Oil 500 MG Caps Take 500 mg by mouth daily.   lactulose 10 GM/15ML solution Commonly known as: CHRONULAC Take 15 mLs (10 g total) by mouth daily.   magnesium oxide 400 MG tablet Commonly known as: MAG-OX Take 1 tablet (400 mg total) by mouth 2 (two)  times daily.   pantoprazole 40 MG tablet Commonly known as: PROTONIX Take 1 tablet (40 mg total) by mouth daily at 6 (six) AM.   Vitamin D3 125 MCG (5000 UT) Tabs Take 5,000 Units by mouth daily.               Discharge Care Instructions  (From admission, onward)           Start     Ordered   01/22/20 0000  Discharge wound care:       Comments: Please keep surgical sites clean with dressing changes after washing as needed.   01/22/20 1601            Disposition and follow-up:   Meagan Peck was discharged from [facility] in Stable condition.  At the hospital follow up visit please address:  Please assist patient in finding a hepatologist if able as she has not followed with one and would benefit from this.   1.  A. Refractory, Decompensated Cirrhosis - Patient completed 2 weeks of IV Ceftriaxone / Cefazolin. Please continue Ciprofloxacin 534m indefinitely for secondary SBP prophylaxis. Continue Lactulose (118m- may need titrated) with goal of 3 soft bowel movements daily. She had SBP treated in the hospital and has refractory ascites. Please monitor closely for recurrence of symptoms and consider paracentesis if indicated.   B. Anasarca - Patient developed diffuse anasarca in  the hospital with low albumin secondary to cirrhosis and decreased appetite. Patient was started on Lasix 58m every other day on last day of hospitalization. 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 Imaging showed significant pleural effusions and patient may benefit from future thoracentesis.   C. 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 Please continue to avoid spironolactone given intolerance in the past. Patient was given Lokelma x 1 during hospitalization prior to discharge. Please follow closely on Lasix 262m every other day.  D. Normocytic Anemia - Iron replacement was held during hospitalization due to active SBP. Please resume iron replacement therapy.   E. DM Type II - Patient states she has previously taken multiple glucose lowering medications including insulins and Metformin as outpatient but notes she was recently taken off these medications with diet-controlled DM due to normal HbA1c; however, sugars were elevated during hospitalization and will likely require outpatient medications. Please consider avoiding Metformin due to cirrhosis. Patient may benefit from insulins vs. Empagliflozin.   2.  Labs / imaging needed at time of follow-up: CBC, CMP, consider CBG monitoring and repeat CXR to assess need for thoracentesis.  3.  Pending labs/ test needing follow-up: None   Follow-up Appointments:  Follow-up Information     LeLevin ErpPA Follow up on 02/20/2020.   Specialty: Gastroenterology Why: 2 PM follow up liver disease.   Contact information: [address]  [place] [phone]       EaOrma RenderNP .   Specialty: Nurse Practitioner Contact information: [address] [address] [address]  [phone] by problem list:   1. Refractory, Decompensated Cirrhosis complicated by SBP Patient was admitted 01/07/20 at the request of her GI physician (Dr. AmEnis Gashfor workup and management of abdominal pain, bloating and decreased appetite in the setting of SBP. Patient was first diagnosed with cirrhosis around March of this year, complicated by recurrent large volume ascites requiring weekly paracentesis, esophageal varices s/p banding, hyperbilirubinemia, elevated PT/INR, and stable thrombocytopenia. Paracentesis fluid from 01/07/20 grew E. Coli. Patient was initially started on ceftriaxone then transitioned to cefazolin IV when sensitivities returned. Paracentesis on 9/13 with negative Gram stain,  negative path review, 68 total nucleated cells. She was initially scheduled to receive [place] shunt, but with improvement in MELD score, patient received inpatient TIPS procedure on 9/8/85ithout complication. EGD 01/16/20 showed Grade I small varices with no stigmata of bleeding. Patient never developed encephalopathy while in the hospital, but was started on lactulose prophylaxis with 3 soft bowel movements daily on 1055mShe did develop an acute abdomen several days after her procedure; however, repeat CT abdomen pelvis showed ascites without other concerning findings, and repeat diagnostic paracentesis continued to show no growth. Patient completed course of 2 weeks IV ABX and was transitioned to ciprofloxacin 500m16mily for secondary SBP prophylaxis. She has outpatient follow up with GI but would benefit from care from a hepatologist as well.   2. Reducible Umbilical Hernia Patient had severe abdominal pain, focused around a reducible but erythematous umbilical hernia. Patient was deemed a poor surgical candidate and pain gradually improved prior to discharge.   3. Normocytic Anemia Hemoglobin remained low throughout admission (~7's), and she did require a transfusion of  pRBC's prior to her TIPS procedure; however, Hgb continued to improve prior to discharge to the 9's. Iron was low at 27, although ferritin was WNL and saturation ratios were unable to be calculated. Iron may have improved s/p pRBC transfusion, although patient would likely benefit from iron replacement (IV vs. Oral) at future date. Not given on admission due to active infection.  4. Thrombocytopenia Patient was not given medical DVT PPx during admission due to low plt counts in the 70's-110's with superficial bleeding under the skin of the forearms and chest wall.   5. Uncontrolled Type II DM Patient states she has previously taken multiple glucose lowering medications including insulins and Metformin as outpatient but notes she was  recently taken off these medications with diet-controlled DM due to normal HbA1c; however, sugars were elevated during hospitalization and will likely require outpatient medications. Please consider avoiding Metformin due to cirrhosis. Patient may benefit from insulins vs. Empagliflozin.   6. Hypotension Blood pressures remained in the 80's-low 100's during admission, likely 2/2 cirrhosis and anasarca. Patient given multiple doses of albumin, which improved pressures, although fluids were avoided as much as possible. Patient was advised to have [facility] PT although refused this.   7. Anasarca Patient developed diffuse swelling of her extremities and also pleural effusions during hospitalization. Likely in setting of low albumin due to cirrhosis and poor PO intake. Patient was started on Lasix 31m every other day at discharge and encouraged to eat higher protein diet. Patient may benefit from outpatient CXR / thoracentesis.   Discharge Vitals:   BP (!) 92/53 (BP Location: Right Arm)   Pulse 78   Temp 98.4 F (36.9 C) (Oral)   Resp 16   Ht _0  (1.473 m)   Wt 65.2 kg   LMP 12/03/2013 (LMP Unknown)   SpO2 100%   BMI 30.03 kg/m   Pertinent Labs, Studies, and Procedures:   Labs: In the ED patient noted to be afebrile, respiratory rate 17, heart rate 82, with hypotensive/soft blood pressures at 93/49.  Urinalysis showed moderate hemoglobin, large leukocytes,> 50 WBCs, and rare bacteria.  CMP showed sodium 128, bicarb 15, creatinine 1.72, albumin 3.3, T bili 3.8, and normal AST, ALT, and alk phos.  Lactic acid mildly elevated at 2.3.  CBC showed WBC 9.3, hemoglobin 7.8, platelets 75.  PT 20 INR 1.8.   Initial Diagnostic Para: Elevated total cell count >1000 with 75% neutrophils   DUPLEX ULTRASOUND OF LIVER COMPARISON:  11/12/2019 FINDINGS: Liver: Coarsened hepatic echotexture.  Nodular surface contour. No focal lesion, mass or intrahepatic biliary ductal dilatation. Main Portal Vein size: 1.6  cm Portal Vein Velocities Main Prox:  23 cm/sec Main Mid: 24 cm/sec Main Dist:  23 cm/sec Right: 16 cm/sec Left: 17 cm/sec Hepatic Vein Velocities Right:  73 cm/sec Middle:  24 cm/sec Left:  23 cm/sec IVC: Present and patent with normal respiratory phasicity. Hepatic Artery Velocity:  142 cm/sec Splenic Vein Velocity:  39 cm/sec Spleen: 10.1 cm x 14.2 cm x 6.2 cm with a total volume of 469 cm^3 (411 cm^3 is upper limit normal) Portal Vein Occlusion/Thrombus: No Splenic Vein Occlusion/Thrombus: No Ascites: Large volume bilateral upper quadrant ascites. Varices: None  IMPRESSION: 1. Patent portal system with antegrade flow. 2. Similar appearing cirrhotic morphology with secondary signs of portal hypertension including large volume ascites and splenomegaly. No hepatoma appreciated.   Electronically Signed   By: DRuthann CancerMD   On: 01/15/2020 08:41  CT ABMEN AND PELVIS WITHOUT CONTRAST COMPARISON:  None. FINDINGS: Lower chest: There are moderate to large bilateral pleural effusions.The heart size is normal. Hepatobiliary: The liver is cirrhotic. There is aDIPS in place, the patency of which cannot be determined on this study. There is a metallic coil in the right hepatic lobe likely representing tract embolization. Status post cholecystectomy.There is no biliary ductal dilation. Spleen: The spleen is enlarged measuring approximately 15 cm craniocaudad. --Small bowel: Unremarkable. --Colon: Unremarkable. Reproductive: There is a fibroid uterus. Other: There is a moderate volume of abdominal ascites. There is anasarca. There is a fluid in fat containing umbilical hernia.   IMPRESSION: 1. Moderate to large bilateral pleural effusions. 2. Cirrhosis with stigmata of portal hypertension. The patient is now status post DIPS. The stent appears to be grossly well positioned. 3. Moderate volume abdominal ascites. 4. Anasarca. Aortic Atherosclerosis (ICD10-I70.0).     Electronically Signed   By: Constance Holster M.D.   On: 01/21/2020 18:20  Patient had 5 Paracentesis Performed this admission; including diagnostic and therapeutic  Discharge Instructions: Discharge Instructions     Call MD for:  difficulty breathing, headache or visual disturbances   Complete by: As directed    Call MD for:  extreme fatigue   Complete by: As directed    Call MD for:  persistant dizziness or light-headedness   Complete by: As directed    Call MD for:  persistant nausea and vomiting   Complete by: As directed    Call MD for:  severe uncontrolled pain   Complete by: As directed    Call MD for:  temperature >100.4   Complete by: As directed    Diet - low sodium heart healthy   Complete by: As directed    Diet Carb Modified   Complete by: As directed    Discharge instructions   Complete by: As directed    Ms. Madero, you were admitted for Spontaneous Bacterial Peritonitis (an infection of the abdomen), a complication of your cirrhosis. Your infection resolved after 2 weeks of antibiotics here in the hospital. A TIPS procedure was performed on 01/14/20 to help prevent fluid from building up in your abdomen. You were also started on Lactulose to prevent confusion and altered mental status that can occur following this procedure. You should continue to have about 3 soft bowel movements daily. You may also notice improvement in your swelling after starting Lasix 21m every other day.   Upon discharge, please START taking: Ciprofloxacin 1 tablet daily  Lactulose 1 tablet daily Lasix (furosemide) 1 tablet every other day - I have prescribed these to your pharmacy.  Please STOP taking: Aspirin, given your increased risk of bleeding. Please consider discussing whether you should restart this and continue on your magnesium oxide supplement with your primary care provider. Please also discuss possibly resuming your home insulin vs. Empagliflozin with your primary care  physician.  You may continue to take your other home medications as prescribed.  I have scheduled you an appointment for next Monday, 01/27/20 at 2:00pm [facility] ARedgie Grayerassistant. Please also be sure to attend your appointment with [facility] on October 21 at 2:00pm. Please see attached contact information to call and reschedule if you are unable to attend these appointments. You will likely need to discuss the possibility of finding a hepatologist with your physicians at follow up.  Thank you and I hope you continue to feel better.   Discharge wound care:   Complete by: As directed  Please keep surgical sites clean with dressing changes after washing as needed.   Increase activity slowly   Complete by: As directed        Signed: Jeralyn Bennett, MD 01/24/2020, 11:02 PM   Pager: [phone]

## 2020-01-24 NOTE — Patient Outreach (Signed)
[facility]) Care Management  01/24/2020  Meagan Peck 04-28-1961 [id number]   Transition of care telephone call  Referral received:01/09/20 Initial outreach:01/24/20 Insurance: [employer]  Initial unsuccessful telephone call to patient's preferred number in order to complete transition of care assessment; no answer, left HIPAA compliant voicemail message requesting return call.   Objective: Per the electronic medical record, Meagan Peck   was hospitalized at [facility] 9/7-9/22/21 for spontaneous bacterial peritonitis,s/p TIPS procedure on 9/14 and Paracentesis on 9/20 with 3.6 liters removed  .Past medical history  includes: Diabetes type 2, NASH ( nonalcoholic steatahepatitis), hyperlipidemia, portal hypertension , esophageal varices without bleeding,  She was discharged to home on 01/22/20  without the need for home health services or durable medical equipment per the discharge summary.   Plan: This RNCM will route unsuccessful outreach letter with [employer] [employer] pamphlet and 24 hour Nurse Advice Line Magnet to [place] [facility] clinical pool to be mailed to patient's home address. This RNCM will attempt another outreach within 4 business days.   Joylene Draft, RN, BSN  [facility] Management Coordinator  [phone]- Mobile [phone]- Toll Free Main Office

## 2020-01-25 LAB — CULTURE, BODY FLUID W GRAM STAIN -BOTTLE: Culture: NO GROWTH

## 2020-01-27 ENCOUNTER — Ambulatory Visit (INDEPENDENT_AMBULATORY_CARE_PROVIDER_SITE_OTHER): Payer: No Typology Code available for payment source | Admitting: Nurse Practitioner

## 2020-01-27 ENCOUNTER — Encounter: Payer: Self-pay | Admitting: Nurse Practitioner

## 2020-01-27 ENCOUNTER — Other Ambulatory Visit: Payer: Self-pay

## 2020-01-27 VITALS — BP 111/63 | HR 88 | Temp 98.0°F | Ht 59.75 in | Wt 153.7 lb

## 2020-01-27 DIAGNOSIS — E878 Other disorders of electrolyte and fluid balance, not elsewhere classified: Secondary | ICD-10-CM

## 2020-01-27 DIAGNOSIS — K2289 Other specified disease of esophagus: Secondary | ICD-10-CM

## 2020-01-27 DIAGNOSIS — R188 Other ascites: Secondary | ICD-10-CM

## 2020-01-27 DIAGNOSIS — Z8719 Personal history of other diseases of the digestive system: Secondary | ICD-10-CM

## 2020-01-27 DIAGNOSIS — K7460 Unspecified cirrhosis of liver: Secondary | ICD-10-CM

## 2020-01-27 DIAGNOSIS — K7469 Other cirrhosis of liver: Secondary | ICD-10-CM

## 2020-01-27 DIAGNOSIS — R601 Generalized edema: Secondary | ICD-10-CM

## 2020-01-27 DIAGNOSIS — Z09 Encounter for follow-up examination after completed treatment for conditions other than malignant neoplasm: Secondary | ICD-10-CM

## 2020-01-27 DIAGNOSIS — K228 Other specified diseases of esophagus: Secondary | ICD-10-CM

## 2020-01-27 DIAGNOSIS — K766 Portal hypertension: Secondary | ICD-10-CM | POA: Diagnosis not present

## 2020-01-27 DIAGNOSIS — D696 Thrombocytopenia, unspecified: Secondary | ICD-10-CM

## 2020-01-27 DIAGNOSIS — K7581 Nonalcoholic steatohepatitis (NASH): Secondary | ICD-10-CM

## 2020-01-27 DIAGNOSIS — R635 Abnormal weight gain: Secondary | ICD-10-CM

## 2020-01-27 DIAGNOSIS — K652 Spontaneous bacterial peritonitis: Secondary | ICD-10-CM

## 2020-01-27 DIAGNOSIS — K31819 Angiodysplasia of stomach and duodenum without bleeding: Secondary | ICD-10-CM | POA: Diagnosis not present

## 2020-01-27 DIAGNOSIS — Z8679 Personal history of other diseases of the circulatory system: Secondary | ICD-10-CM

## 2020-01-27 DIAGNOSIS — I8510 Secondary esophageal varices without bleeding: Secondary | ICD-10-CM

## 2020-01-27 MED ORDER — AMBULATORY NON FORMULARY MEDICATION: 0 refills | Status: DC

## 2020-01-27 NOTE — Assessment & Plan Note (Signed)
History of cirrhosis secondary to NASH with subsequent development of portal hypertension. She does have significant abdominal distention today however it is difficult to determine if this is soft tissue versus intra-abdominal. No signs of bleeding noted today. We will obtain labs today to evaluate hemoglobin stability.

## 2020-01-27 NOTE — Assessment & Plan Note (Addendum)
Significant concern for 10 pound weight gain in 5 days since hospital discharge. She is clearly retaining fluid with significant edema of her bilateral lower extremities and anasarca of the soft tissue around her midsection.  It is unclear if her weight gain is related to interstitial fluid increase or accumulation of peritoneal fluid. I have made contact with GI to see if we can get her appointment moved to hopefully be seen this week. Plan to increase Lasix to 20 mg/day. We will consider increasing Lasix dose more based on lab results-kidney function and electrolyte's can tolerate even if just for a few days. If we are unable to get her in to GI we will plan to see her in person towards the end of the week at least for a weight check and determine at that point if paracentesis would be necessary. Will obtain labs today to monitor for electrolyte imbalance and signs of infection.

## 2020-01-27 NOTE — Assessment & Plan Note (Signed)
Presence of an liver cirrhosis and portal hypertension. 10 pound weight gain in the past 5 days since hospital discharge. I am significantly concerned about the accumulation of fluid that she presents with today.  It is difficult to determine if all of the fluid is interstitial or if she is also developing peritoneal fluid accumulation as well. We will plan to increase her Lasix to 20 mg daily. Will obtain labs today to evaluate for electrolyte imbalance.  If no imbalance is present will consider increasing Lasix dose temporarily to help remove excess fluid. I have made contact with GI to see if we can get her appointment moved to a sooner date given the severity of her symptoms.  If they are unable to see her by the end of the week we will plan to bring her back in Thursday or Friday for a weight check and evaluation of symptoms. If she is unable to tolerate increase Lasix doses we may need to consider rehospitalization or possible paracentesis.

## 2020-01-27 NOTE — Assessment & Plan Note (Signed)
EGD performed during hospitalization. No evidence of bleeding today. Continue to monitor for signs of bleeding. Will obtain labs today to monitor hemoglobin.

## 2020-01-27 NOTE — Assessment & Plan Note (Signed)
Electrolyte disturbance in the setting of liver cirrhosis and anasarca. We will plan to evaluate electrolyte today. In the past she has been unable to tolerate spironolactone in addition to Lasix due to significant electrolyte imbalances so we will avoid this combination. There is a very difficult balance here to remove excess fluid while maintaining electrolyte normality and preserving kidney function in the outpatient setting. We will plan to monitor closely.

## 2020-01-27 NOTE — Patient Instructions (Addendum)
Lets plan to get labs today to check your electrolytes today  I would like you to increase the lasix to 32m every day for now. I will call you this evening or tomorrow morning to let you know our next steps. I think you need to be seen with GI sooner than 4 weeks from now.     Peripheral Edema  Peripheral edema is swelling that is caused by a buildup of fluid. Peripheral edema most often affects the lower legs, ankles, and feet. It can also develop in the arms, hands, and face. The area of the body that has peripheral edema will look swollen. It may also feel heavy or warm. Your clothes may start to feel tight. Pressing on the area may make a temporary dent in your skin. You may not be able to move your swollen arm or leg as much as usual. There are many causes of peripheral edema. It can happen because of a complication of other conditions such as congestive heart failure, kidney disease, or a problem with your blood circulation. It also can be a side effect of certain medicines or because of an infection. It often happens to women during pregnancy. Sometimes, the cause is not known. Follow these instructions at home: Managing pain, stiffness, and swelling   Raise (elevate) your legs while you are sitting or lying down.  Move around often to prevent stiffness and to lessen swelling.  Do not sit or stand for long periods of time.  Wear support stockings as told by your health care provider. Medicines  Take over-the-counter and prescription medicines only as told by your health care provider.  Your health care provider may prescribe medicine to help your body get rid of excess water (diuretic). General instructions  Pay attention to any changes in your symptoms.  Follow instructions from your health care provider about limiting salt (sodium) in your diet. Sometimes, eating less salt may reduce swelling.  Moisturize skin daily to help prevent skin from cracking and draining.  Keep  all follow-up visits as told by your health care provider. This is important. Contact a health care provider if you have:  A fever.  Edema that starts suddenly or is getting worse, especially if you are pregnant or have a medical condition.  Swelling in only one leg.  Increased swelling, redness, or pain in one or both of your legs.  Drainage or sores at the area where you have edema. Get help right away if you:  Develop shortness of breath, especially when you are lying down.  Have pain in your chest or abdomen.  Feel weak.  Feel faint. Summary  Peripheral edema is swelling that is caused by a buildup of fluid. Peripheral edema most often affects the lower legs, ankles, and feet.  Move around often to prevent stiffness and to lessen swelling. Do not sit or stand for long periods of time.  Pay attention to any changes in your symptoms.  Contact a health care provider if you have edema that starts suddenly or is getting worse, especially if you are pregnant or have a medical condition.  Get help right away if you develop shortness of breath, especially when lying down. This information is not intended to replace advice given to you by your health care provider. Make sure you discuss any questions you have with your health care provider. Document Revised: 01/10/2018 Document Reviewed: 01/10/2018 Elsevier Patient Education  [name]

## 2020-01-27 NOTE — Assessment & Plan Note (Signed)
Follow-up today and 9/7 to 01/22/2020. We will obtain repeat labs today to evaluate electrolyte, kidney, liver function and white blood cells and stability of hemoglobin. She is retaining a significant amount of fluid and has had a 5 pound weight gain since hospital discharge 5 days ago.  This is significantly concerning. Will increase Lasix doses to 20 mg/day pending the results of her electrolyte and kidney function.  Will most likely need to increase this dosage if she is able to tolerate it. I have made contact with GI to see if we can get her appointment moved to a sooner time.  Recommend close monitoring of breathing and weight.  Recommend sleeping on a pillow wedge to avoid accumulation of fluid into the lungs- script provided for pillow wedge.

## 2020-01-27 NOTE — Assessment & Plan Note (Signed)
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 No signs of bleeding today. We will obtain labs today to monitor stability of hemoglobin.

## 2020-01-27 NOTE — Assessment & Plan Note (Addendum)
Thrombocytopenia noted during recent hospitalization.  Plan to recheck pt/inr today and also monitor for hemoglobin stability.  No signs of bleeding present today, however, there is significant concerns for increased bleeding risk due to portal hypertension and the presence of varices on EGD.  Monitor closely for acute bleeding.

## 2020-01-27 NOTE — Assessment & Plan Note (Signed)
Liver cirrhosis in the setting of NASH. Her symptoms appear to be progressing rapidly since diagnosis in March of this year. Recent hospitalization for sepsis due to spontaneous peritoneal bacteria presents. TI PS procedure performed while in the hospital.

## 2020-01-27 NOTE — Assessment & Plan Note (Addendum)
Spontaneous bacterial peritonitis in the setting of portal hypertension and cirrhosis secondary to NASH.  Resolution of bacterial infection during hospitalization. No signs of infection today. Continue to closely monitor. We will obtain labs today to monitor white blood counts.

## 2020-01-27 NOTE — Assessment & Plan Note (Signed)
NASH with liver cirrhosis and portal hypertension. It appears that her illness has progressed rapidly since first being diagnosed in March of this year. Do the significance of her symptoms I would like for her to be seen with GI as soon as possible, preferably this week. If we are unable to get her into GI plan to bring her back to the office later this week for weight check and evaluation of symptoms. She may likely need another paracentesis or an increase in her Lasix dosage.  Will obtain labs today to monitor electrolyte and kidney function to determine if she can tolerate increased dosages. I have reached out to GI to see if we can get her in sooner-awaiting contact from them.

## 2020-01-27 NOTE — Progress Notes (Signed)
Acute Office Visit  Subjective:    Patient ID: Meagan Peck, female    DOB: 1960-07-03, 59 y.o.   MRN: [id number]  Chief Complaint  Patient presents with  . Hospitalization Follow-up    [facility] 01/07/2020-01/22/2020  . Blood Infection    HPI Patient is in today for follow-up for hospitalization.  Meagan Peck was hospitalized at [facility] from 9/7 through 01/22/2020. On 01/07/2020 she went for scheduled paracentesis.  She reports shortly after she returned home she was called and told to return to the emergency room for evaluation due to spontaneous bacterial peritonitis and sepsis.  She was admitted to the hospital and treated with IV antibiotics. During the course of her hospitalization she required multiple doses of IV albumin and to units of packed red blood cells.  During the hospitalization she also required 5 paracenteses.   During her hospitalization she was treated for spontaneous bacterial peritonitis, cirrhosis, portal hypertension, angiodysplasia of upper gastrointestinal tract, NASH, and thrombocytopenia.  She underwent surgical procedure for TI PS on 01/14/2020 and EGD with cauterization of 2 gastric AVMs and banding of small esophageal varices on 01/16/2020.  She presents today with a 10 pound weight gain since hospital discharge on 01/22/2020.  She reports she has not had a paracentesis since she was in the hospital due to the TIPS placement.  She states that she was told that she may likely need paracentesis in the interim while the TIPS procedure is regulating however she has not had any of these set up at this time.  She does have a follow-up with GI in approximately 1 month.  She does report significant edema bilaterally in her lower extremities.  She also reports a significant amount of edema in her abdominal tissue.  She reports that this is different from in the past when she was in need of paracentesis for fluid removal and this "feels more like the fluid is  building up in the tissue".  She reports that the skin is tender to touch on her abdomen and her legs bilaterally.  She does also endorse difficulty taking very deep breaths, low tolerance to walking or moving around, and fatigue.  She reports she has been taking her medication as prescribed.  She is currently on furosemide 20 mg with every other day dosing.  She also reports taking her lactulose daily.  She reports she has been reducing her sodium intake and monitoring her fluid intake.  She denies any new fevers, crackles or wheezes, chest pain.   Past Medical History:  Diagnosis Date  . Acute medial meniscus tear of right knee 12/10/2015  . Allergy   . Arthritis    bil feet  . Ascites   . Cataract    bilateral - surgery to remove  . Cirrhosis ([place])   . Cirrhosis ([place])   . Cirrhosis ([place]) 08/16/2019  . Diabetes mellitus without complication ([place])    type 2 - last a1c was 5.2 per patient, no meds  . Dyspnea    when I have too much fluid  . Esophageal varices ([place])   . GERD (gastroesophageal reflux disease)   . Heart murmur   . Hepatic cirrhosis ([place])   . HLD (hyperlipidemia)    no meds  . Hypertension    no meds  . Iron deficiency anemia 09/26/2018    Past Surgical History:  Procedure Laterality Date  . BIOPSY  08/20/2019   Procedure: BIOPSY;  Surgeon: Jackquline Denmark, MD;  Location: [facility]  ENDOSCOPY;  Service: Gastroenterology;;  . CARPAL TUNNEL RELEASE Right   . CESAREAN SECTION    . CHOLECYSTECTOMY    . COLONOSCOPY  08/2019  . ESOPHAGEAL BANDING N/A 09/19/2019   Procedure: ESOPHAGEAL BANDING;  Surgeon: Yetta Flock, MD;  Location: [facility] ENDOSCOPY;  Service: Gastroenterology;  Laterality: N/A;  . ESOPHAGOGASTRODUODENOSCOPY (EGD) WITH PROPOFOL N/A 08/20/2019   Procedure: ESOPHAGOGASTRODUODENOSCOPY (EGD) WITH PROPOFOL;  Surgeon: Jackquline Denmark, MD;  Location: [place];  Service: Gastroenterology;  Laterality: N/A;  . ESOPHAGOGASTRODUODENOSCOPY (EGD) WITH PROPOFOL N/A  09/19/2019   Procedure: ESOPHAGOGASTRODUODENOSCOPY (EGD) WITH PROPOFOL;  Surgeon: Yetta Flock, MD;  Location: [facility] ENDOSCOPY;  Service: Gastroenterology;  Laterality: N/A;  . ESOPHAGOGASTRODUODENOSCOPY (EGD) WITH PROPOFOL N/A 01/16/2020   Procedure: ESOPHAGOGASTRODUODENOSCOPY (EGD) WITH PROPOFOL;  Surgeon: Jerene Bears, MD;  Location: [facility] ENDOSCOPY;  Service: Endoscopy;  Laterality: N/A;  . EYE SURGERY Bilateral    removed cataracts  . GASTRIC VARICES BANDING N/A 08/20/2019   Procedure: GASTRIC VARICES BANDING;  Surgeon: Jackquline Denmark, MD;  Location: [place];  Service: Gastroenterology;  Laterality: N/A;  . HOT HEMOSTASIS N/A 01/16/2020   Procedure: HOT HEMOSTASIS (ARGON PLASMA COAGULATION/BICAP);  Surgeon: Jerene Bears, MD;  Location: [facility] ENDOSCOPY;  Service: Endoscopy;  Laterality: N/A;  . IR PARACENTESIS  08/01/2019  . IR PARACENTESIS  08/19/2019  . IR PARACENTESIS  09/09/2019  . IR PARACENTESIS  09/23/2019  . IR PARACENTESIS  10/08/2019  . IR PARACENTESIS  10/23/2019  . IR PARACENTESIS  11/12/2019  . IR PARACENTESIS  11/21/2019  . IR PARACENTESIS  11/25/2019  . IR PARACENTESIS  12/05/2019  . IR PARACENTESIS  12/13/2019  . IR PARACENTESIS  12/20/2019  . IR PARACENTESIS  12/26/2019  . IR PARACENTESIS  12/31/2019  . IR PARACENTESIS  01/07/2020  . IR PARACENTESIS  01/10/2020  . IR PARACENTESIS  01/13/2020  . IR PARACENTESIS  01/14/2020  . IR PARACENTESIS  01/20/2020  . IR RADIOLOGIST EVAL & MGMT  10/30/2019  . IR RADIOLOGIST EVAL & MGMT  12/11/2019  . IR RADIOLOGIST EVAL & MGMT  12/24/2019  . IR TIPS  11/25/2019  . IR TIPS  01/14/2020  . KNEE ARTHROSCOPY Right 12/10/2015   Procedure: RIGHT KNEE ARTHROSCOPY WITH PARTIAL MEDIAL MENISCECTOMY;  Surgeon: Mcarthur Rossetti, MD;  Location: [facility] ORS;  Service: Orthopedics;  Laterality: Right;  . KNEE ARTHROSCOPY W/ MENISCAL REPAIR Bilateral   . MENISCUS REPAIR Left   . RADIOLOGY WITH ANESTHESIA N/A 11/25/2019   Procedure: TIPS;  Surgeon: Corrie Mckusick,  DO;  Location: [place];  Service: Anesthesiology;  Laterality: N/A;  . RADIOLOGY WITH ANESTHESIA N/A 01/14/2020   Procedure: TIPS;  Surgeon: Corrie Mckusick, DO;  Location: [place];  Service: Anesthesiology;  Laterality: N/A;    Family History  Problem Relation Age of Onset  . Hypertension Mother   . COPD Mother   . Alcoholism Father   . Heart attack Father   . Diabetes Brother   . Diabetes Paternal Grandmother   . Heart disease Paternal Grandmother   . Alcoholism Paternal Uncle   . Alcoholism Maternal Uncle   . Heart disease Maternal Grandmother   . Heart disease Maternal Grandfather   . Heart disease Paternal Grandfather     Social History   Socioeconomic History  . Marital status: Married    Spouse name: Lilyanah Celestin  . Number of children: 1  . Years of education: Not on file  . Highest education level: Associate degree: academic program  Occupational History  .  Not on file  Tobacco Use  . Smoking status: Never Smoker  . Smokeless tobacco: Never Used  Vaping Use  . Vaping Use: Never used  Substance and Sexual Activity  . Alcohol use: No  . Drug use: No  . Sexual activity: Yes    Partners: Male    Birth control/protection: None, Post-menopausal  Other Topics Concern  . Not on file  Social History Narrative  . Not on file   Social Determinants of Health   Financial Resource Strain:   . Difficulty of Paying Living Expenses: Not on file  Food Insecurity:   . Worried About Charity fundraiser in the Last Year: Not on file  . Ran Out of Food in the Last Year: Not on file  Transportation Needs:   . Lack of Transportation (Medical): Not on file  . Lack of Transportation (Non-Medical): Not on file  Physical Activity:   . Days of Exercise per Week: Not on file  . Minutes of Exercise per Session: Not on file  Stress:   . Feeling of Stress : Not on file  Social Connections:   . Frequency of Communication with Friends and Family: Not on file  . Frequency of Social  Gatherings with Friends and Family: Not on file  . Attends Religious Services: Not on file  . Active Member of Clubs or Organizations: Not on file  . Attends Archivist Meetings: Not on file  . Marital Status: Not on file  Intimate Partner Violence:   . Fear of Current or Ex-Partner: Not on file  . Emotionally Abused: Not on file  . Physically Abused: Not on file  . Sexually Abused: Not on file    Outpatient Medications Prior to Visit  Medication Sig Dispense Refill  . ACCU-CHEK FASTCLIX LANCETS MISC Check fsbs TID    . cetirizine (ZYRTEC) 10 MG tablet Take 1 tablet (10 mg total) by mouth daily. (Patient taking differently: Take 10 mg by mouth daily as needed for allergies. ) 90 tablet 3  . Cholecalciferol (VITAMIN D3) 5000 UNITS TABS Take 5,000 Units by mouth daily.     . ciprofloxacin (CIPRO) 500 MG tablet Take 1 tablet (500 mg total) by mouth daily. 30 tablet 0  . ferrous sulfate 325 (65 FE) MG EC tablet Take 1 tablet (325 mg total) by mouth 2 (two) times daily with a meal. 180 tablet 1  . furosemide (LASIX) 20 MG tablet Take 1 tablet (20 mg total) by mouth every other day. 15 tablet 0  . GENERLAC 10 GM/15ML SOLN Take 10 g by mouth daily.    Javier Docker Oil 500 MG CAPS Take 500 mg by mouth daily.    Marland Kitchen lactulose (CHRONULAC) 10 GM/15ML solution Take 15 mLs (10 g total) by mouth daily. 473 mL 0  . magnesium oxide (MAG-OX) 400 MG tablet Take 1 tablet (400 mg total) by mouth 2 (two) times daily. 180 tablet 3  . pantoprazole (PROTONIX) 40 MG tablet Take 1 tablet (40 mg total) by mouth daily at 6 (six) AM. 30 tablet 0   No facility-administered medications prior to visit.    Allergies  Allergen Reactions  . Penicillins Anaphylaxis    Happened as a young child  . Atrovent [Ipratropium] Palpitations    As per patient  . Naproxen Rash  . Quinine Derivatives Rash  . Sulfa Antibiotics Rash    Review of Systems See HPI for pertinent positives and negatives.    Objective:  Physical Exam Vitals and nursing note reviewed.  Constitutional:      General: She is not in acute distress. HENT:     Head: Normocephalic.  Eyes:     General: Scleral icterus present.     Extraocular Movements: Extraocular movements intact.     Conjunctiva/sclera: Conjunctivae normal.     Pupils: Pupils are equal, round, and reactive to light.  Neck:     Vascular: No carotid bruit.  Cardiovascular:     Rate and Rhythm: Normal rate and regular rhythm.     Pulses: Normal pulses.     Heart sounds: Normal heart sounds. No murmur heard.   Pulmonary:     Effort: Accessory muscle usage present.     Breath sounds: Normal breath sounds. No decreased air movement. No decreased breath sounds, wheezing, rhonchi or rales.  Abdominal:     General: Abdomen is protuberant. Bowel sounds are normal. There is distension.     Tenderness: There is generalized abdominal tenderness. There is no right CVA tenderness, left CVA tenderness, guarding or rebound.  Musculoskeletal:        General: Swelling and tenderness present.     Cervical back: Normal range of motion.     Right lower leg: 4+ Edema present.     Left lower leg: 4+ Edema present.  Skin:    General: Skin is warm and dry.     Capillary Refill: Capillary refill takes less than 2 seconds.  Neurological:     General: No focal deficit present.     Mental Status: She is alert and oriented to person, place, and time.     Motor: Weakness present.  Psychiatric:        Mood and Affect: Mood normal.        Behavior: Behavior normal.        Thought Content: Thought content normal.        Judgment: Judgment normal.     BP 111/63   Pulse 88   Temp 98 F (36.7 C) (Oral)   Ht 4' 11.75" (1.518 m)   Wt 153 lb 11.2 oz (69.7 kg)   LMP 12/03/2013 (LMP Unknown)   SpO2 100%   BMI 30.27 kg/m  Wt Readings from Last 3 Encounters:  01/27/20 153 lb 11.2 oz (69.7 kg)  01/22/20 143 lb 11.2 oz (65.2 kg)  12/20/19 150 lb (68 kg)    There are no  preventive care reminders to display for this patient.  There are no preventive care reminders to display for this patient.   Lab Results  Component Value Date   TSH 1.71 02/09/2018   Lab Results  Component Value Date   WBC 6.1 01/22/2020   HGB 9.0 (L) 01/22/2020   HCT 27.1 (L) 01/22/2020   MCV 89.4 01/22/2020   PLT 104 (L) 01/22/2020   Lab Results  Component Value Date   NA 127 (L) 01/22/2020   K 5.4 (H) 01/22/2020   CO2 21 (L) 01/22/2020   GLUCOSE 105 (H) 01/22/2020   BUN 15 01/22/2020   CREATININE 0.91 01/22/2020   BILITOT 2.7 (H) 01/22/2020   ALKPHOS 108 01/22/2020   AST 48 (H) 01/22/2020   ALT 9 01/22/2020   PROT 4.6 (L) 01/22/2020   ALBUMIN 2.4 (L) 01/22/2020   CALCIUM 8.0 (L) 01/22/2020   ANIONGAP 6 01/22/2020   GFR 34.98 (L) 12/31/2019   Lab Results  Component Value Date   CHOL 151 09/21/2018   Lab Results  Component Value Date  HDL 53 09/21/2018   Lab Results  Component Value Date   LDLCALC 84 09/21/2018   Lab Results  Component Value Date   TRIG 56 09/21/2018   Lab Results  Component Value Date   CHOLHDL 2.8 09/21/2018   Lab Results  Component Value Date   HGBA1C 5.2 10/30/2019       Assessment & Plan:   Problem List Items Addressed This Visit      Cardiovascular and Mediastinum   Esophageal varices without bleeding ([place])    EGD performed during hospitalization. No evidence of bleeding today. Continue to monitor for signs of bleeding. Will obtain labs today to monitor hemoglobin.      Portal hypertension (HCC) - Primary    History of cirrhosis secondary to NASH with subsequent development of portal hypertension. She does have significant abdominal distention today however it is difficult to determine if this is soft tissue versus intra-abdominal. No signs of bleeding noted today. We will obtain labs today to evaluate hemoglobin stability.      Relevant Medications   AMBULATORY NON FORMULARY MEDICATION   Other Relevant Orders    Amb Referral to Hepatology   BASIC METABOLIC PANEL WITH GFR   CBC with Differential/Platelet   INR/PT   Magnesium   Angiodysplasia of upper gastrointestinal tract    AVMx2 noted on EGD during hospitalization with cauterization of both areas. No signs of bleeding today. We will obtain labs today to monitor stability of hemoglobin.      Relevant Orders   Amb Referral to Hepatology   BASIC METABOLIC PANEL WITH GFR   CBC with Differential/Platelet   INR/PT   Magnesium     Digestive   NASH (nonalcoholic steatohepatitis)    NASH with liver cirrhosis and portal hypertension. It appears that her illness has progressed rapidly since first being diagnosed in March of this year. Do the significance of her symptoms I would like for her to be seen with GI as soon as possible, preferably this week. If we are unable to get her into GI plan to bring her back to the office later this week for weight check and evaluation of symptoms. She may likely need another paracentesis or an increase in her Lasix dosage.  Will obtain labs today to monitor electrolyte and kidney function to determine if she can tolerate increased dosages. I have reached out to GI to see if we can get her in sooner-awaiting contact from them.      Relevant Medications   AMBULATORY NON FORMULARY MEDICATION   Other Relevant Orders   Amb Referral to Hepatology   BASIC METABOLIC PANEL WITH GFR   CBC with Differential/Platelet   INR/PT   Magnesium   Liver cirrhosis secondary to NASH (HCC)    Liver cirrhosis in the setting of NASH. Her symptoms appear to be progressing rapidly since diagnosis in March of this year. Recent hospitalization for sepsis due to spontaneous peritoneal bacteria presents. TI PS procedure performed while in the hospital.      Relevant Orders   Amb Referral to Hepatology   BASIC METABOLIC PANEL WITH GFR   CBC with Differential/Platelet   INR/PT   Magnesium   RESOLVED: Cirrhosis (HCC)   Relevant  Medications   AMBULATORY NON FORMULARY MEDICATION   Other Relevant Orders   Amb Referral to Hepatology   BASIC METABOLIC PANEL WITH GFR   CBC with Differential/Platelet   INR/PT   Magnesium     Other   Thrombocytopenia (HCC)  Thrombocytopenia noted during recent hospitalization.  Plan to recheck pt/inr today and also monitor for hemoglobin stability.  No signs of bleeding present today, however, there is significant concerns for increased bleeding risk due to portal hypertension and the presence of varices on EGD.  Monitor closely for acute bleeding.       Relevant Orders   Amb Referral to Hepatology   BASIC METABOLIC PANEL WITH GFR   CBC with Differential/Platelet   INR/PT   Magnesium   SBP (spontaneous bacterial peritonitis) ([place])    Spontaneous bacterial peritonitis in the setting of portal hypertension and cirrhosis secondary to NASH.  Resolution of bacterial infection during hospitalization. No signs of infection today. Continue to closely monitor. We will obtain labs today to monitor white blood counts.      Relevant Orders   Amb Referral to Hepatology   BASIC METABOLIC PANEL WITH GFR   CBC with Differential/Platelet   INR/PT   Magnesium   Abnormal weight gain    Significant concern for 10 pound weight gain in 5 days since hospital discharge. She is clearly retaining fluid with significant edema of her bilateral lower extremities and anasarca of the soft tissue around her midsection.  It is unclear if her weight gain is related to interstitial fluid increase or accumulation of peritoneal fluid. I have made contact with GI to see if we can get her appointment moved to hopefully be seen this week. Plan to increase Lasix to 20 mg/day. We will consider increasing Lasix dose more based on lab results-kidney function and electrolyte's can tolerate even if just for a few days. If we are unable to get her in to GI we will plan to see her in person towards the end of the week  at least for a weight check and determine at that point if paracentesis would be necessary. Will obtain labs today to monitor for electrolyte imbalance and signs of infection.      Relevant Orders   Amb Referral to Hepatology   BASIC METABOLIC PANEL WITH GFR   CBC with Differential/Platelet   INR/PT   Magnesium   Anasarca    Presence of an liver cirrhosis and portal hypertension. 10 pound weight gain in the past 5 days since hospital discharge. I am significantly concerned about the accumulation of fluid that she presents with today.  It is difficult to determine if all of the fluid is interstitial or if she is also developing peritoneal fluid accumulation as well. We will plan to increase her Lasix to 20 mg daily. Will obtain labs today to evaluate for electrolyte imbalance.  If no imbalance is present will consider increasing Lasix dose temporarily to help remove excess fluid. I have made contact with GI to see if we can get her appointment moved to a sooner date given the severity of her symptoms.  If they are unable to see her by the end of the week we will plan to bring her back in Thursday or Friday for a weight check and evaluation of symptoms. If she is unable to tolerate increase Lasix doses we may need to consider rehospitalization or possible paracentesis.      Relevant Medications   AMBULATORY NON FORMULARY MEDICATION   Other Relevant Orders   Amb Referral to Hepatology   BASIC METABOLIC PANEL WITH GFR   CBC with Differential/Platelet   INR/PT   Magnesium   Electrolyte disturbance    Electrolyte disturbance in the setting of liver cirrhosis and anasarca. We will plan  to evaluate electrolyte today. In the past she has been unable to tolerate spironolactone in addition to Lasix due to significant electrolyte imbalances so we will avoid this combination. There is a very difficult balance here to remove excess fluid while maintaining electrolyte normality and preserving kidney  function in the outpatient setting. We will plan to monitor closely.      Relevant Orders   Amb Referral to Hepatology   BASIC METABOLIC PANEL WITH GFR   CBC with Differential/Platelet   INR/PT   Magnesium   RESOLVED: History of portal hypertension   Relevant Orders   Amb Referral to Hepatology   BASIC METABOLIC PANEL WITH GFR   CBC with Differential/Platelet   INR/PT   Magnesium       Meds ordered this encounter  Medications  . AMBULATORY NON FORMULARY MEDICATION    Sig: Wedge pillow    Dispense:  1 each    Refill:  0   > than 50 minutes spent for this visit with >50% of the time spent discussing course of hospitalization, current treatments, previous treatments, education, and collaboration with other providers.   Orma Render, NP

## 2020-01-28 ENCOUNTER — Telehealth: Payer: Self-pay

## 2020-01-28 ENCOUNTER — Other Ambulatory Visit: Payer: Self-pay

## 2020-01-28 DIAGNOSIS — K7460 Unspecified cirrhosis of liver: Secondary | ICD-10-CM

## 2020-01-28 DIAGNOSIS — R188 Other ascites: Secondary | ICD-10-CM

## 2020-01-28 LAB — CBC WITH DIFFERENTIAL/PLATELET
Absolute Monocytes: 616 cells/uL (ref 200–950)
Basophils Absolute: 89 cells/uL (ref 0–200)
Basophils Relative: 1.9 %
Eosinophils Absolute: 179 cells/uL (ref 15–500)
Eosinophils Relative: 3.8 %
HCT: 27.4 % — ABNORMAL LOW (ref 35.0–45.0)
Hemoglobin: 9.5 g/dL — ABNORMAL LOW (ref 11.7–15.5)
Lymphs Abs: 696 cells/uL — ABNORMAL LOW (ref 850–3900)
MCH: 30.9 pg (ref 27.0–33.0)
MCHC: 34.7 g/dL (ref 32.0–36.0)
MCV: 89.3 fL (ref 80.0–100.0)
MPV: 9.6 fL (ref 7.5–12.5)
Monocytes Relative: 13.1 %
Neutro Abs: 3121 cells/uL (ref 1500–7800)
Neutrophils Relative %: 66.4 %
Platelets: 92 10*3/uL — ABNORMAL LOW (ref 140–400)
RBC: 3.07 10*6/uL — ABNORMAL LOW (ref 3.80–5.10)
RDW: 15.4 % — ABNORMAL HIGH (ref 11.0–15.0)
Total Lymphocyte: 14.8 %
WBC: 4.7 10*3/uL (ref 3.8–10.8)

## 2020-01-28 LAB — BASIC METABOLIC PANEL WITH GFR
BUN: 20 mg/dL (ref 7–25)
CO2: 22 mmol/L (ref 20–32)
Calcium: 8 mg/dL — ABNORMAL LOW (ref 8.6–10.4)
Chloride: 110 mmol/L (ref 98–110)
Creat: 0.95 mg/dL (ref 0.50–1.05)
GFR, Est African American: 77 mL/min/{1.73_m2} (ref >=60)
GFR, Est Non African American: 66 mL/min/{1.73_m2} (ref >=60)
Glucose, Bld: 88 mg/dL (ref 65–139)
Potassium: 4.5 mmol/L (ref 3.5–5.3)
Sodium: 137 mmol/L (ref 135–146)

## 2020-01-28 LAB — PROTIME-INR
INR: 1.2 — ABNORMAL HIGH
Prothrombin Time: 13 s — ABNORMAL HIGH (ref 9.0–11.5)

## 2020-01-28 LAB — MAGNESIUM: Magnesium: 1.5 mg/dL (ref 1.5–2.5)

## 2020-01-28 NOTE — Addendum Note (Signed)
Addended by: Yevette Edwards on: 01/28/2020 03:42 PM   Modules accepted: Orders

## 2020-01-28 NOTE — Progress Notes (Signed)
Labs have really improved!  * Kidney function looks good * Sodium and potassium are finally balanced out- Magnesium is good, also.  * Hemoglobin is up to 9.5! * Platelets are still a little low, but this is expected and hemoglobin has improved so we will just continue to watch this.  * Clotting times have improved, too!   I spoke with Ellouise Newer yesterday with your GI office (Dr. Havery Moros was out of the office until tomorrow, I believe) and she is going to see if they can get you in sooner. I will let you know what I find out or they may reach out to you directly.   I will touch base with you this afternoon to see how you are feeling. Keep taking the 73m Lasix a day. Since your labs look so good, we may consider increasing for one or two days, but I will let you know about that when we chat.   Please let u[place]now if your symptoms worsen. I may have you come back in on Thursday or Friday for a weight check if you can't get in with GI this week- I want to keep close tabs on this so it doesn't get out of control.

## 2020-01-28 NOTE — Telephone Encounter (Signed)
-----   Message from Yetta Flock, MD sent at 01/28/2020  3:20 PM EDT ----- Regarding: paracentesis Naidelyn Parrella not sure if you saw the Epic secure chat messages about this patient. Her PCP is concerned that she is reaccumulating fluid again, increasing lasix dosing. I think she needs a large volume paracentesis, with albumin if > 5 L removed. Can you see if there is room to add her with IR for this in the next 1-2 days? If she feels worse in the interim, with shortness of breath, etc, she will need to go back to the hospital. Labs look stable today but will need a repeat BMET at the end of this week. Thanks

## 2020-01-28 NOTE — Telephone Encounter (Signed)
Patient is scheduled for paracentesis at [facility] on 01/30/20 at 10 am, must arrive at 9:45 am. Lab order in epic. Left message on patient's voicemail for her to return call for appt information.

## 2020-01-29 ENCOUNTER — Telehealth: Payer: Self-pay | Admitting: Nurse Practitioner

## 2020-01-29 ENCOUNTER — Other Ambulatory Visit: Payer: Self-pay | Admitting: *Deleted

## 2020-01-29 DIAGNOSIS — K7460 Unspecified cirrhosis of liver: Secondary | ICD-10-CM

## 2020-01-29 DIAGNOSIS — E1169 Type 2 diabetes mellitus with other specified complication: Secondary | ICD-10-CM

## 2020-01-29 DIAGNOSIS — R601 Generalized edema: Secondary | ICD-10-CM

## 2020-01-29 DIAGNOSIS — K31819 Angiodysplasia of stomach and duodenum without bleeding: Secondary | ICD-10-CM

## 2020-01-29 DIAGNOSIS — K7581 Nonalcoholic steatohepatitis (NASH): Secondary | ICD-10-CM

## 2020-01-29 DIAGNOSIS — K766 Portal hypertension: Secondary | ICD-10-CM

## 2020-01-29 DIAGNOSIS — D696 Thrombocytopenia, unspecified: Secondary | ICD-10-CM

## 2020-01-29 DIAGNOSIS — K2289 Other specified disease of esophagus: Secondary | ICD-10-CM

## 2020-01-29 DIAGNOSIS — K652 Spontaneous bacterial peritonitis: Secondary | ICD-10-CM

## 2020-01-29 DIAGNOSIS — R635 Abnormal weight gain: Secondary | ICD-10-CM

## 2020-01-29 NOTE — Telephone Encounter (Signed)
Spoke with patient regarding her appointment that is scheduled for paracentesis on 01/30/20. Pt aware that if she feels worse in the meantime with any SOB or difficulty breathing she will need to go to the ER for evaluation. Pt is aware that labs looked stable and that Dr. Havery Moros would like her to repeat labs at the end of the week, advised that she can have this done at her PCPs office and if not she is welcome to go to our lab. Pt verbalized understanding.

## 2020-01-29 NOTE — Patient Outreach (Signed)
[facility]) Care Management  01/29/2020  Meagan Peck 1960/06/20 [id number]  COVERING FOR Landis Martins, RN  TRANSITION OF CARE  2nd unsuccessful telephone call to patient's preferred number in order to complete transition of care assessment; no answer, left HIPAA compliant voicemail message requesting return call.   Will report the the covering case manager and request another outreach call to completed the transition of care over the next week.  Raina Mina, RN Care Management Coordinator [place] Office [phone]

## 2020-01-29 NOTE — Telephone Encounter (Signed)
Spoke with patient on the phone this morning to check and see how she is feeling since being seen on Monday in the office.   She: reports that her shortness of breath is not any worse than when she was here, but her edema has worsened. She has increased the lasix to 65m per day. Unfortunately, the swelling is still quite significant in her abdomen and lower extremities and she reports that she is unable to bend her knees at this time due to edema. She also tells me that she can no longer get shoes on due to swelling.   She denies any weeping of the skin in the abdomen or the legs or skin breakdown at this time.   She is scheduled for a paracentesis tomorrow morning at 10:00 with IR- set up by Dr. AHavery Moros(GI) and she will have another BMET drawn at that time.   PLAN: - Based on recent electrolyte and kidney function, increase Lasix dose to 447mfor today.  - Follow BMET from Dr. ArHavery Moroso determine if further escalation of dosing can be tolerated.  - Patient is to start weighing at home daily and report the weights to me. - Will consider increasing Lasix dose intermittently to see if we can get fluid removed at a faster rate. Consider 4059mr 49m5mtermittent dosing at Kidneys and electrolyte function will allow.  - Follow BMET closely with increased lasix- will likely need weekly monitoring.  - Referral to case management to determine if we can expedite the process of hepatology referral due to insurance barriers.  Will be happy to provide written documentation for necessity of need for hepatology and the need for out of network service for this patient based on her current condition and the rapid progression of her illness.

## 2020-01-30 ENCOUNTER — Other Ambulatory Visit: Payer: Self-pay

## 2020-01-30 ENCOUNTER — Other Ambulatory Visit (INDEPENDENT_AMBULATORY_CARE_PROVIDER_SITE_OTHER): Payer: No Typology Code available for payment source

## 2020-01-30 ENCOUNTER — Ambulatory Visit (HOSPITAL_COMMUNITY)
Admission: RE | Admit: 2020-01-30 | Discharge: 2020-01-30 | Disposition: A | Discharge status: Home / Self Care | Payer: No Typology Code available for payment source | Source: Ambulatory Visit | Attending: Gastroenterology | Admitting: Gastroenterology

## 2020-01-30 DIAGNOSIS — R188 Other ascites: Secondary | ICD-10-CM

## 2020-01-30 DIAGNOSIS — K7460 Unspecified cirrhosis of liver: Secondary | ICD-10-CM | POA: Diagnosis present

## 2020-01-30 HISTORY — PX: IR PARACENTESIS: IMG2679

## 2020-01-30 LAB — BASIC METABOLIC PANEL
BUN: 16 mg/dL (ref 6–23)
CO2: 23 mEq/L (ref 19–32)
Calcium: 7.9 mg/dL — ABNORMAL LOW (ref 8.4–10.5)
Chloride: 106 mEq/L (ref 96–112)
Creatinine, Ser: 0.83 mg/dL (ref 0.40–1.20)
GFR: 70.82 mL/min (ref >60.00)
Glucose, Bld: 89 mg/dL (ref 70–99)
Potassium: 3 mEq/L — ABNORMAL LOW (ref 3.5–5.1)
Sodium: 137 mEq/L (ref 135–145)

## 2020-01-30 LAB — BODY FLUID CELL COUNT WITH DIFFERENTIAL
Eos, Fluid: 0 %
Lymphs, Fluid: 81 %
Monocyte-Macrophage-Serous Fluid: 18 % — ABNORMAL LOW (ref 50–90)
Neutrophil Count, Fluid: 1 % (ref 0–25)
Total Nucleated Cell Count, Fluid: 187 cu mm (ref 0–1000)

## 2020-01-30 MED ORDER — LIDOCAINE HCL 1 % IJ SOLN
INTRAMUSCULAR | Status: AC
  Filled 2020-01-30: qty 20

## 2020-01-30 NOTE — Procedures (Signed)
PROCEDURE SUMMARY:  Successful US guided paracentesis from left abdomen.  Yielded 3.2 L of clear yellow fluid.  No immediate complications.  Pt tolerated well.   Specimen sent for labs.  EBL < 2 mL  Theresa Duty, NP 01/30/2020 2:01 PM

## 2020-02-01 LAB — PATHOLOGIST SMEAR REVIEW

## 2020-02-03 ENCOUNTER — Encounter: Payer: Self-pay | Admitting: Nurse Practitioner

## 2020-02-03 ENCOUNTER — Other Ambulatory Visit: Payer: Self-pay

## 2020-02-03 ENCOUNTER — Other Ambulatory Visit: Payer: Self-pay | Admitting: Nurse Practitioner

## 2020-02-03 DIAGNOSIS — E876 Hypokalemia: Secondary | ICD-10-CM

## 2020-02-03 DIAGNOSIS — K7460 Unspecified cirrhosis of liver: Secondary | ICD-10-CM

## 2020-02-03 DIAGNOSIS — R188 Other ascites: Secondary | ICD-10-CM

## 2020-02-03 MED ORDER — POTASSIUM CHLORIDE ER 20 MEQ PO TBCR: 20.0000 meq | EXTENDED_RELEASE_TABLET | Freq: Every day | ORAL | 3 refills | Status: DC

## 2020-02-03 MED ORDER — POTASSIUM CHLORIDE CRYS ER 20 MEQ PO TBCR: 20.0000 meq | EXTENDED_RELEASE_TABLET | Freq: Every day | ORAL | 2 refills | Status: DC

## 2020-02-03 MED FILL — FREESTYLE LITE TEST STRIP: 75 days supply | Qty: 300 | Fill #1

## 2020-02-03 MED FILL — FREESTYLE LANCETS: 75 days supply | Qty: 300 | Fill #1

## 2020-02-03 MED FILL — POTASSIUM CL ER 20 MEQ TAB: 20 | 90 days supply | Qty: 90 | Fill #0

## 2020-02-04 ENCOUNTER — Other Ambulatory Visit: Payer: Self-pay | Admitting: *Deleted

## 2020-02-04 NOTE — Patient Outreach (Signed)
[facility]) Care Management  02/04/2020  CESAR ALF Apr 15, 1961 [id number]   Transition of care call Referral received: 01/09/20 Initial outreach attempt: 01/24/20 Insurance: [place] unsuccessful telephone call to patient's preferred contact number in order to complete post hospital discharge transition of care assessment; no answer, left HIPAA compliant message requesting return call.   Objective: Per the electronic medical record, Mrs.Bores   was hospitalized at [facility] 9/7-9/22/21 for spontaneous bacterial peritonitis,s/p TIPS procedure on 9/14 and Paracentesis on 9/20 with 3.6 liters removed  .Past medical history  includes: Diabetes type 2, NASH ( nonalcoholic steatahepatitis), hyperlipidemia, portal hypertension , esophageal varices without bleeding,  She was discharged to home on 01/22/20  without the need for home health services or durable medical equipment per the discharge summary.    Plan: If no return call from patient, will plan return call in the next 3 weeks.  Received referral  from Jacolyn Reedy NP to [facility] care management with following note :[employer] Employee with severe liver cirrhosis from NASH- having difficulty getting a hepatologist due to insurance barriers. Her illness is progressing rapidly and has required recent prolonged hospital stay. I feel she would be an excellent candidate for transplant if we can get her in with a specialist soon. Will communicate via secure chat message, discussed difficulty with making contact with patient , and will forward her referral note to [facility] representative.   Joylene Draft, RN, BSN  [place] Management Coordinator  [phone]- Mobile [phone]- Toll Free Main Office

## 2020-02-06 ENCOUNTER — Other Ambulatory Visit: Payer: Self-pay

## 2020-02-06 ENCOUNTER — Other Ambulatory Visit (INDEPENDENT_AMBULATORY_CARE_PROVIDER_SITE_OTHER): Payer: No Typology Code available for payment source

## 2020-02-06 DIAGNOSIS — R188 Other ascites: Secondary | ICD-10-CM

## 2020-02-06 DIAGNOSIS — K7460 Unspecified cirrhosis of liver: Secondary | ICD-10-CM

## 2020-02-06 DIAGNOSIS — E876 Hypokalemia: Secondary | ICD-10-CM

## 2020-02-06 LAB — BASIC METABOLIC PANEL
BUN: 13 mg/dL (ref 6–23)
CO2: 25 mEq/L (ref 19–32)
Calcium: 7.7 mg/dL — ABNORMAL LOW (ref 8.4–10.5)
Chloride: 107 mEq/L (ref 96–112)
Creatinine, Ser: 1.04 mg/dL (ref 0.40–1.20)
GFR: 59.53 mL/min — ABNORMAL LOW (ref >60.00)
Glucose, Bld: 220 mg/dL — ABNORMAL HIGH (ref 70–99)
Potassium: 3.6 mEq/L (ref 3.5–5.1)
Sodium: 137 mEq/L (ref 135–145)

## 2020-02-07 ENCOUNTER — Other Ambulatory Visit: Payer: Self-pay

## 2020-02-07 DIAGNOSIS — E876 Hypokalemia: Secondary | ICD-10-CM

## 2020-02-07 DIAGNOSIS — R188 Other ascites: Secondary | ICD-10-CM

## 2020-02-07 DIAGNOSIS — K7460 Unspecified cirrhosis of liver: Secondary | ICD-10-CM

## 2020-02-11 ENCOUNTER — Other Ambulatory Visit: Payer: Self-pay

## 2020-02-11 DIAGNOSIS — R188 Other ascites: Secondary | ICD-10-CM

## 2020-02-11 DIAGNOSIS — K7460 Unspecified cirrhosis of liver: Secondary | ICD-10-CM

## 2020-02-11 NOTE — Telephone Encounter (Signed)
Lab order in epic.  

## 2020-02-12 ENCOUNTER — Other Ambulatory Visit (INDEPENDENT_AMBULATORY_CARE_PROVIDER_SITE_OTHER): Payer: No Typology Code available for payment source

## 2020-02-12 ENCOUNTER — Telehealth: Payer: Self-pay

## 2020-02-12 ENCOUNTER — Other Ambulatory Visit: Payer: Self-pay | Admitting: Physician Assistant

## 2020-02-12 ENCOUNTER — Other Ambulatory Visit: Payer: Self-pay | Admitting: Interventional Radiology

## 2020-02-12 DIAGNOSIS — K7460 Unspecified cirrhosis of liver: Secondary | ICD-10-CM

## 2020-02-12 DIAGNOSIS — R188 Other ascites: Secondary | ICD-10-CM

## 2020-02-12 DIAGNOSIS — K7469 Other cirrhosis of liver: Secondary | ICD-10-CM

## 2020-02-12 LAB — BASIC METABOLIC PANEL
BUN: 11 mg/dL (ref 6–23)
CO2: 23 mEq/L (ref 19–32)
Calcium: 7.6 mg/dL — ABNORMAL LOW (ref 8.4–10.5)
Chloride: 108 mEq/L (ref 96–112)
Creatinine, Ser: 0.88 mg/dL (ref 0.40–1.20)
GFR: 72.84 mL/min (ref >60.00)
Glucose, Bld: 144 mg/dL — ABNORMAL HIGH (ref 70–99)
Potassium: 3.4 mEq/L — ABNORMAL LOW (ref 3.5–5.1)
Sodium: 137 mEq/L (ref 135–145)

## 2020-02-12 NOTE — Telephone Encounter (Signed)
Sent patient a My Chart message to remind her that she is due for labs

## 2020-02-12 NOTE — Telephone Encounter (Signed)
-----   Message from Yevette Edwards, RN sent at 02/10/2020 10:11 AM EDT ----- Regarding: FW: Labs  ----- Message ----- From: Yevette Edwards, RN Sent: 02/10/2020 To: Yevette Edwards, RN Subject: [employer], order in epic

## 2020-02-13 ENCOUNTER — Other Ambulatory Visit: Payer: Self-pay

## 2020-02-13 MED ORDER — AMILORIDE HCL 5 MG PO TABS: 5.0000 mg | ORAL_TABLET | Freq: Every day | ORAL | 1 refills | Status: DC

## 2020-02-13 MED FILL — aMILoride HCL 5 MG TABS: 5 | 30 days supply | Qty: 30 | Fill #0

## 2020-02-14 ENCOUNTER — Other Ambulatory Visit: Payer: Self-pay

## 2020-02-14 MED ORDER — FUROSEMIDE 20 MG PO TABS: 20.0000 mg | ORAL_TABLET | Freq: Two times a day (BID) | ORAL | 2 refills | Status: DC

## 2020-02-14 MED ORDER — PANTOPRAZOLE SODIUM 40 MG PO TBEC: 40.0000 mg | DELAYED_RELEASE_TABLET | Freq: Every day | ORAL | 3 refills | Status: DC

## 2020-02-17 ENCOUNTER — Other Ambulatory Visit: Payer: Self-pay

## 2020-02-17 MED ORDER — FUROSEMIDE 20 MG PO TABS: 20.0000 mg | ORAL_TABLET | Freq: Two times a day (BID) | ORAL | 2 refills | Status: DC

## 2020-02-17 MED ORDER — PANTOPRAZOLE SODIUM 40 MG PO TBEC
40.0000 mg | DELAYED_RELEASE_TABLET | Freq: Every day | ORAL | 3 refills | Status: DC
Start: 2020-02-17 — End: 2020-02-17

## 2020-02-17 MED FILL — FUROSEMIDE 20 MG TABLET: 20 | 30 days supply | Qty: 60 | Fill #0

## 2020-02-17 MED FILL — PANTOPRAZOLE SOD DR 40 MG T: 40 | 90 days supply | Qty: 90 | Fill #0

## 2020-02-17 NOTE — Progress Notes (Signed)
Prescriptions did not go through. Resent to [place]

## 2020-02-19 ENCOUNTER — Encounter: Payer: Self-pay | Admitting: *Deleted

## 2020-02-19 ENCOUNTER — Telehealth: Payer: Self-pay

## 2020-02-19 NOTE — Telephone Encounter (Signed)
Sent patient a reminder via My Chart as a reminder to have labs drawn before her appt at 2 PM tomorrow.

## 2020-02-19 NOTE — Telephone Encounter (Signed)
-----   Message from Yevette Edwards, RN sent at 02/17/2020 10:58 AM EDT ----- Regarding: FW: Labs  ----- Message ----- From: Yevette Edwards, RN Sent: 02/14/2020 To: Yevette Edwards, RN Subject: Labs                                           Repeat BMET, order in epic

## 2020-02-20 ENCOUNTER — Other Ambulatory Visit: Payer: Self-pay | Admitting: Physician Assistant

## 2020-02-20 ENCOUNTER — Encounter: Payer: Self-pay | Admitting: Physician Assistant

## 2020-02-20 ENCOUNTER — Telehealth: Payer: Self-pay

## 2020-02-20 ENCOUNTER — Ambulatory Visit (INDEPENDENT_AMBULATORY_CARE_PROVIDER_SITE_OTHER): Payer: No Typology Code available for payment source | Admitting: Physician Assistant

## 2020-02-20 ENCOUNTER — Other Ambulatory Visit (INDEPENDENT_AMBULATORY_CARE_PROVIDER_SITE_OTHER): Payer: No Typology Code available for payment source

## 2020-02-20 VITALS — BP 102/56 | HR 96 | Ht <= 58 in | Wt 186.1 lb

## 2020-02-20 DIAGNOSIS — K7460 Unspecified cirrhosis of liver: Secondary | ICD-10-CM

## 2020-02-20 DIAGNOSIS — R601 Generalized edema: Secondary | ICD-10-CM

## 2020-02-20 DIAGNOSIS — E876 Hypokalemia: Secondary | ICD-10-CM | POA: Diagnosis not present

## 2020-02-20 DIAGNOSIS — R188 Other ascites: Secondary | ICD-10-CM

## 2020-02-20 DIAGNOSIS — K7581 Nonalcoholic steatohepatitis (NASH): Secondary | ICD-10-CM

## 2020-02-20 LAB — BASIC METABOLIC PANEL
BUN: 9 mg/dL (ref 6–23)
CO2: 23 mEq/L (ref 19–32)
Calcium: 7.6 mg/dL — ABNORMAL LOW (ref 8.4–10.5)
Chloride: 107 mEq/L (ref 96–112)
Creatinine, Ser: 0.85 mg/dL (ref 0.40–1.20)
GFR: 76.17 mL/min (ref >60.00)
Glucose, Bld: 99 mg/dL (ref 70–99)
Potassium: 3.1 mEq/L — ABNORMAL LOW (ref 3.5–5.1)
Sodium: 136 mEq/L (ref 135–145)

## 2020-02-20 MED ORDER — ALBUMIN HUMAN 25 % IV SOLN: 25.0000 g | Freq: Once | INTRAVENOUS | Status: DC

## 2020-02-20 MED ORDER — CIPROFLOXACIN HCL 500 MG PO TABS: 500.0000 mg | ORAL_TABLET | Freq: Every day | ORAL | 2 refills | Status: DC

## 2020-02-20 MED FILL — CIPROFLOXACIN HCL 500 MG TA: 500 | 30 days supply | Qty: 30 | Fill #0

## 2020-02-20 NOTE — Progress Notes (Signed)
Chief Complaint: Follow-up cirrhosis status post DIPS  HPI:    Meagan Peck is a 59 year old Caucasian female with a past medical history as listed below including cirrhosis complicated by varices and ascites secondary to NASH now status post DIPS, known to Dr. Havery Moros, who presents clinic today for follow-up.    01/09/2020-01/22/2020 hospitalization for SBP.  CT of the abdomen pelvis 01/21/2020 showed moderate to large bilateral pleural effusions, cirrhosis with stigmata of portal hypertension status post DIPS, stent appeared to be grossly well-positioned, moderate volume abdominal ascites, anasarca and aortic atherosclerosis.  01/22/2020 we saw the patient last in hospital and she had persistent abdominal pain that was unrelieved with a paracentesis 2 days prior.  She had resolution of SBP.  Paracentesis 01/20/2020 with 3.6 L removal of fluid.  Was discussed the [facility] was not in her network for liver transplant.  Continued with thrombocytopenia but this was improved and noncritical.  Continued on ciprofloxacin 500 mg p.o. daily, lactulose 10 g p.o. daily and Protonix 40 mg p.o. daily.    01/30/2020 paracentesis with successful ultrasound-guided paracentesis yielding 3.2 L of peritoneal fluid.    02/12/2020 patient had a potassium minimally decreased at 3.4.    Today, the patient tells me that she continues to have fluid issues, it is now moving into her legs and even arms, it feels "like the fluid is in my skin", instead of just in her abdomen.  Tells me that she is taking medications as prescribed including Lasix 20 mg twice a day and Amiloride 5 mg daily.  Also continues on her Lactulose and Pantoprazole.  Overall tells me that she is very fatigued and has trouble getting in and out of bed.  She is trying to connect with a clinic that is covered by her insurance for possible liver transplant but has not begun this process yet.  Reports being up 30 pounds since seeing her PCP on 01/27/2020.  Is  following up with IR on Monday for an ultrasound Doppler to ensure that her DIPS is working.    Denies fever, chills, blood in her stool or change in bowel habits.  Endoscopic history: EGD 09/19/19 - - Esophageal varices noted as outlined above - 5 bands placed resulting in deflation. - Normal stomach. - Benign small polyp of the duodenum previously biopsied, not biopsied today. - Normal duodenum otherwise  EGD 08/20/19 - Grade II and large (>5 mm) esophageal varices. Banded x 5 - Portal hypertensive gastropathy. - Single nodule in duodenal bulb. Biopsied.  Colonoscopy4/13/21 -A few small, nonbleeding sigmoid, descending, IC valve AVMs, were not treated or ablated. Small internal hemorrhoids.  Echo 07/25/19 - EF 60-65%  Past Medical History:  Diagnosis Date  . Acute medial meniscus tear of right knee 12/10/2015  . Allergy   . Anasarca   . Arthritis    bil feet  . Ascites   . Cataract    bilateral - surgery to remove  . Cirrhosis ([place])   . Diabetes mellitus without complication ([place])    type 2 - last a1c was 5.2 per patient, no meds  . Dyspnea    when I have too much fluid  . Esophageal varices ([place])   . GERD (gastroesophageal reflux disease)   . Heart murmur   . Hepatic cirrhosis ([place])   . HLD (hyperlipidemia)    no meds  . Hypertension    no meds  . Iron deficiency anemia 09/26/2018    Past Surgical History:  Procedure Laterality Date  .  BIOPSY  08/20/2019   Procedure: BIOPSY;  Surgeon: Jackquline Denmark, MD;  Location: [place];  Service: Gastroenterology;;  . CARPAL TUNNEL RELEASE Right   . CESAREAN SECTION    . CHOLECYSTECTOMY    . COLONOSCOPY  08/2019  . ESOPHAGEAL BANDING N/A 09/19/2019   Procedure: ESOPHAGEAL BANDING;  Surgeon: Yetta Flock, MD;  [facility];  Service: Gastroenterology;  Laterality: N/A;  . ESOPHAGOGASTRODUODENOSCOPY (EGD) WITH PROPOFOL N/A 08/20/2019   Procedure: ESOPHAGOGASTRODUODENOSCOPY (EGD) WITH PROPOFOL;   Surgeon: Jackquline Denmark, MD;  Location: [place];  Service: Gastroenterology;  Laterality: N/A;  . ESOPHAGOGASTRODUODENOSCOPY (EGD) WITH PROPOFOL N/A 09/19/2019   Procedure: ESOPHAGOGASTRODUODENOSCOPY (EGD) WITH PROPOFOL;  Surgeon: Yetta Flock, MD;  [facility];  Service: Gastroenterology;  Laterality: N/A;  . ESOPHAGOGASTRODUODENOSCOPY (EGD) WITH PROPOFOL N/A 01/16/2020   Procedure: ESOPHAGOGASTRODUODENOSCOPY (EGD) WITH PROPOFOL;  Surgeon: Jerene Bears, MD;  Location: [place] [facility];  Service: Endoscopy;  Laterality: N/A;  . EYE SURGERY Bilateral    removed cataracts  . GASTRIC VARICES BANDING N/A 08/20/2019   Procedure: GASTRIC VARICES BANDING;  Surgeon: Jackquline Denmark, MD;  Location: [place];  Service: Gastroenterology;  Laterality: N/A;  . HOT HEMOSTASIS N/A 01/16/2020   Procedure: HOT HEMOSTASIS (ARGON PLASMA COAGULATION/BICAP);  Surgeon: Jerene Bears, MD;  Location: [facility] END[facility]COPY;  Service: Endoscopy;  Laterality: N/A;  . IR PARACENTESIS  08/01/2019  . IR PARACENTESIS  08/19/2019  . IR PARACENTESIS  09/09/2019  . IR PARACENTESIS  09/23/2019  . IR PARACENTESIS  10/08/2019  . IR PARACENTESIS  10/23/2019  . IR PARACENTESIS  11/12/2019  . IR PARACENTESIS  11/21/2019  . IR PARACENTESIS  11/25/2019  . IR PARACENTESIS  12/05/2019  . IR PARACENTESIS  12/13/2019  . IR PARACENTESIS  12/20/2019  . IR PARACENTESIS  12/26/2019  . IR PARACENTESIS  12/31/2019  . IR PARACENTESIS  01/07/2020  . IR PARACENTESIS  01/10/2020  . IR PARACENTESIS  01/13/2020  . IR PARACENTESIS  01/14/2020  . IR PARACENTESIS  01/20/2020  . IR PARACENTESIS  01/30/2020  . IR RADIOLOGIST EVAL & MGMT  10/30/2019  . IR RADIOLOGIST EVAL & MGMT  12/11/2019  . IR RADIOLOGIST EVAL & MGMT  12/24/2019  . IR TIPS  11/25/2019  . IR TIPS  01/14/2020  . KNEE ARTHROSCOPY Right 12/10/2015   Procedure: RIGHT KNEE ARTHROSCOPY WITH PARTIAL MEDIAL MENISCECTOMY;  Surgeon: Mcarthur Rossetti, MD;  Location: [facility];  Service: Orthopedics;   Laterality: Right;  . KNEE ARTHROSCOPY W/ MENISCAL REPAIR Bilateral   . MENISCUS REPAIR Left   . RADIOLOGY WITH ANESTHESIA N/A 11/25/2019   Procedure: TIPS;  Surgeon: Corrie Mckusick, DO;  Location: [place];  Service: Anesthesiology;  Laterality: N/A;  . RADIOLOGY WITH ANESTHESIA N/A 01/14/2020   Procedure: TIPS;  Surgeon: Corrie Mckusick, DO;  Location: [place];  Service: Anesthesiology;  Laterality: N/A;    Current Outpatient Medications  Medication Sig Dispense Refill  . ACCU-CHEK FASTCLIX LANCETS MISC Check fsbs TID    . AMBULATORY NON FORMULARY MEDICATION Wedge pillow 1 each 0  . aMILoride (MIDAMOR) 5 MG tablet Take 1 tablet (5 mg total) by mouth daily. 30 tablet 1  . cetirizine (ZYRTEC) 10 MG tablet Take 1 tablet (10 mg total) by mouth daily. (Patient taking differently: Take 10 mg by mouth daily as needed for allergies. ) 90 tablet 3  . Cholecalciferol (VITAMIN D3) 5000 UNITS TABS Take 5,000 Units by mouth daily.     . ciprofloxacin (CIPRO) 500 MG tablet Take 1  tablet (500 mg total) by mouth daily. 30 tablet 0  . ferrous sulfate 325 (65 FE) MG EC tablet Take 1 tablet (325 mg total) by mouth 2 (two) times daily with a meal. 180 tablet 1  . furosemide (LASIX) 20 MG tablet Take 1 tablet (20 mg total) by mouth 2 (two) times daily. 60 tablet 2  . GENERLAC 10 GM/15ML SOLN Take 10 g by mouth daily.    Javier Docker Oil 500 MG CAPS Take 500 mg by mouth daily.    Marland Kitchen lactulose (CHRONULAC) 10 GM/15ML solution Take 15 mLs (10 g total) by mouth daily. 473 mL 0  . magnesium oxide (MAG-OX) 400 MG tablet Take 1 tablet (400 mg total) by mouth 2 (two) times daily. 180 tablet 3  . pantoprazole (PROTONIX) 40 MG tablet Take 1 tablet (40 mg total) by mouth daily. 90 tablet 3  . potassium chloride 20 MEQ TBCR Take 20 mEq by mouth daily. 90 tablet 3   No current facility-administered medications for this visit.    Allergies as of 02/20/2020 - Review Complete 01/27/2020  Allergen Reaction Noted  . Penicillins  Anaphylaxis 02/01/2013  . Atrovent [ipratropium] Palpitations 08/11/2017  . Naproxen Rash 02/01/2013  . Quinine derivatives Rash 04/14/2014  . Sulfa antibiotics Rash 02/01/2013    Family History  Problem Relation Age of Onset  . Hypertension Mother   . COPD Mother   . Alcoholism Father   . Heart attack Father   . Diabetes Brother   . Diabetes Paternal Grandmother   . Heart disease Paternal Grandmother   . Alcoholism Paternal Uncle   . Alcoholism Maternal Uncle   . Heart disease Maternal Grandmother   . Heart disease Maternal Grandfather   . Heart disease Paternal Grandfather     Social History   Socioeconomic History  . Marital status: Married    Spouse name: Naia Ruff  . Number of children: 1  . Years of education: Not on file  . Highest education level: Associate degree: academic program  Occupational History  . Not on file  Tobacco Use  . Smoking status: Never Smoker  . Smokeless tobacco: Never Used  Vaping Use  . Vaping Use: Never used  Substance and Sexual Activity  . Alcohol use: No  . Drug use: No  . Sexual activity: Yes    Partners: Male    Birth control/protection: None, Post-menopausal  Other Topics Concern  . Not on file  Social History Narrative  . Not on file   Social Determinants of Health   Financial Resource Strain:   . Difficulty of Paying Living Expenses: Not on file  Food Insecurity:   . Worried About Charity fundraiser in the Last Year: Not on file  . Ran Out of Food in the Last Year: Not on file  Transportation Needs:   . Lack of Transportation (Medical): Not on file  . Lack of Transportation (Non-Medical): Not on file  Physical Activity:   . Days of Exercise per Week: Not on file  . Minutes of Exercise per Session: Not on file  Stress:   . Feeling of Stress : Not on file  Social Connections:   . Frequency of Communication with Friends and Family: Not on file  . Frequency of Social Gatherings with Friends and Family: Not  on file  . Attends Religious Services: Not on file  . Active Member of Clubs or Organizations: Not on file  . Attends Archivist Meetings: Not on file  .  Marital Status: Not on file  Intimate Partner Violence:   . Fear of Current or Ex-Partner: Not on file  . Emotionally Abused: Not on file  . Physically Abused: Not on file  . Sexually Abused: Not on file    Review of Systems:    Constitutional: No weight loss, fever or chills Cardiovascular: No chest pain Respiratory:+SOB Gastrointestinal: See HPI and otherwise negative   Physical Exam:  Vital signs: BP (!) 102/56 (BP Location: Left Arm, Patient Position: Sitting, Cuff Size: Normal)   Pulse 96   Ht 4' 9.75" (1.467 m)   Wt 186 lb 2 oz (84.4 kg)   LMP 12/03/2013 (LMP Unknown)   BMI 39.24 kg/m   Constitutional:   Pleasant Caucasian female appears to be in NAD, Well developed, Well nourished, alert and cooperative Respiratory: Respirations even and unlabored. Lungs clear to auscultation bilaterally.    Cardiovascular: Normal S1, S2. No MRG. Regular rate and rhythm. No peripheral edema, cyanosis or pallor.  Gastrointestinal:  Tense, Marked distension, Discomfort to palpation, B/l peripheral edema in legs and arms, normal bowel sounds  Rectal:  Not performed.  Psychiatric: Demonstrates good judgement and reason without abnormal affect or behaviors.  RELEVANT LABS AND IMAGING: CBC    Component Value Date/Time   WBC 4.7 01/27/2020 1513   RBC 3.07 (L) 01/27/2020 1513   HGB 9.5 (L) 01/27/2020 1513   HCT 27.4 (L) 01/27/2020 1513   PLT 92 (L) 01/27/2020 1513   MCV 89.3 01/27/2020 1513   MCH 30.9 01/27/2020 1513   MCHC 34.7 01/27/2020 1513   RDW 15.4 (H) 01/27/2020 1513   LYMPHSABS 696 (L) 01/27/2020 1513   MONOABS 0.8 01/07/2020 1932   EOSABS 179 01/27/2020 1513   BASOSABS 89 01/27/2020 1513    CMP     Component Value Date/Time   NA 137 02/12/2020 1539   K 3.4 (L) 02/12/2020 1539   CL 108 02/12/2020 1539    CO2 23 02/12/2020 1539   GLUCOSE 144 (H) 02/12/2020 1539   BUN 11 02/12/2020 1539   CREATININE 0.88 02/12/2020 1539   CREATININE 0.95 01/27/2020 1513   CALCIUM 7.6 (L) 02/12/2020 1539   PROT 4.6 (L) 01/22/2020 0429   ALBUMIN 2.4 (L) 01/22/2020 0429   AST 48 (H) 01/22/2020 0429   ALT 9 01/22/2020 0429   ALKPHOS 108 01/22/2020 0429   BILITOT 2.7 (H) 01/22/2020 0429   GFRNONAA 66 01/27/2020 1513   GFRAA 77 01/27/2020 1513    Assessment: 1.  [name] cirrhosis with ascites and anasarca: Status post DIPS procedure 01/14/2020 with 3 paracentesis since then all with around 3 L of fluid removal, also recent hospitalization for SBP now on prophylactic ciprofloxacin, currently tolerating Lasix 20 mg twice daily and amiloride 5 mg daily  Plan: 1.  Unfortunately patient's case is very complicated with continual fluid buildup regardless of DIPS and diuretics.  We will schedule her for a repeat therapeutic paracentesis. 2.  For now continue Furosemide 20 mg twice daily and Amiloride 5 mg daily. 3.  Also continue Lactulose 15 mL daily and Pantoprazole 40 mg daily. 4.  Believe that it is a good plan for patient to contact [facility], she tells me they are playing "phone tag" right now in regards to finding her a hepatologist that is within her network.  Ultimately it would be good for her to get on the transplant list if she is a candidate. 5.  Refilled Ciprofloxacin 500 mg p.o. daily.  The patient should remain on this for  prophylaxis. 6.  We will try to get patient scheduled for a follow-up visit with Dr. Havery Moros himself as she is a very complicated case.  This should be within the next 3 to 4 weeks if possible.  Encouraged the patient to stay in contact us with [place] in the interim so that we can help her as needed.  Ellouise Newer, PA-C [facility] 02/20/2020, 1:36 PM  Cc: [name], DO

## 2020-02-20 NOTE — Progress Notes (Signed)
Agree with assessment with the following thoughts. Complicated situation, she did not tolerate diuretics in the past, had refractory ascites, led to TIPS. Course also complicated by SBP recently and now on prophylaxis for that. She is clearly retaining fluid despite TIPS unfortunately. IR is evaluating her next week to ensure stent is patent. BMET done today and on lasix 54m BID and amiloride 53m/ day, we have slowly been titrating up diuretics as she tolerates given electrolyte disturbances in the past. Her K is low, so can increase amiloride to 37m50mID and continue lasix 79m8mD. I have asked RN PoweFlorene Glencall the patient with my recommendations on her diuretics, repeat BMET next week to ensure stable. Agree with paracentesis in the interim. She had been to see Dr. [facility] hepat[facility], unfortunately sounds like they are now out of network and agree we can refer to transplant center of her choice in case she worsens. Hopefully with more time TIPS will help her.

## 2020-02-20 NOTE — Telephone Encounter (Signed)
-----   Message from Yevette Edwards, RN sent at 02/13/2020 12:42 PM EDT ----- Regarding: Labs Repeat BMET, order in epic. Notify patient via My Chart

## 2020-02-20 NOTE — Patient Instructions (Addendum)
You have been scheduled for an abdominal paracentesis at [facility]  radiology (1st floor of hospital) on 02/24/20 at 1:00pm. Please arrive at least 15 minutes prior to your appointment time for registration. Should you need to reschedule this appointment for any reason, please call our office at [phone].  We have sent the following medications to your pharmacy for you to pick up at your convenience: Ciprofloxacin  We have scheduled a follow up appointment with Dr.Armbruster

## 2020-02-20 NOTE — Telephone Encounter (Signed)
Patient notified via My Chart

## 2020-02-24 ENCOUNTER — Other Ambulatory Visit: Payer: Self-pay

## 2020-02-24 ENCOUNTER — Ambulatory Visit
Admission: RE | Admit: 2020-02-24 | Discharge: 2020-02-24 | Disposition: A | Discharge status: Home / Self Care | Payer: No Typology Code available for payment source | Source: Ambulatory Visit | Attending: Interventional Radiology | Admitting: Interventional Radiology

## 2020-02-24 ENCOUNTER — Ambulatory Visit (HOSPITAL_COMMUNITY)
Admission: RE | Admit: 2020-02-24 | Discharge: 2020-02-24 | Disposition: A | Discharge status: Home / Self Care | Payer: No Typology Code available for payment source | Source: Ambulatory Visit | Attending: Physician Assistant | Admitting: Physician Assistant

## 2020-02-24 DIAGNOSIS — K7581 Nonalcoholic steatohepatitis (NASH): Secondary | ICD-10-CM | POA: Insufficient documentation

## 2020-02-24 DIAGNOSIS — K7460 Unspecified cirrhosis of liver: Secondary | ICD-10-CM | POA: Diagnosis not present

## 2020-02-24 DIAGNOSIS — K7469 Other cirrhosis of liver: Secondary | ICD-10-CM

## 2020-02-24 DIAGNOSIS — R188 Other ascites: Secondary | ICD-10-CM | POA: Diagnosis not present

## 2020-02-24 HISTORY — PX: IR PARACENTESIS: IMG2679

## 2020-02-24 MED ORDER — LIDOCAINE HCL (PF) 1 % IJ SOLN
INTRAMUSCULAR | Status: AC | PRN
  Administered 2020-02-24: 10 mL

## 2020-02-24 NOTE — Procedures (Signed)
PROCEDURE SUMMARY:  Successful US guided paracentesis from right mid abdomen  Yielded 4 L of clear yellow fluid.  No immediate complications.  Pt tolerated well.   EBL < 1 mL  Theresa Duty, NP 02/24/2020 12:48 PM

## 2020-02-25 ENCOUNTER — Other Ambulatory Visit: Payer: Self-pay | Admitting: *Deleted

## 2020-02-25 NOTE — Patient Outreach (Signed)
[facility]) Care Management  02/25/2020  Meagan Peck 04-04-1961 [id number]   Transition of care call/case closure  Referral received: 01/09/20 Initial outreach attempt: 01/24/20 Insurance: [place]   4th  unsuccessful telephone call to patient's preferred contact number in order to complete post hospital discharge transition of care assessment; no answer, left HIPAA compliant message requesting return call.   Objective: Per the electronic medical record, Mrs.Shepherdwas [facility]21 for spontaneous bacterial peritonitis,s/p TIPS procedure on 9/14 and Paracentesis on 9/20 with 3.6 liters removed.Past medical historyincludes:Diabetes type 2, NASH ( nonalcoholic steatahepatitis), hyperlipidemia, portal hypertension , esophageal varices without bleeding, She was discharged to home on9/22/21without the need for home health services or durable medical equipment per the discharge summary.   Plan: If no return call from patient, will close case to [name] Management unsuccessful call attempt x 4.     Joylene Draft, RN, BSN  [place] Management Coordinator  [phone]- Mobile [phone]- Toll Free Main Office

## 2020-02-27 ENCOUNTER — Other Ambulatory Visit: Payer: Self-pay

## 2020-02-27 ENCOUNTER — Ambulatory Visit
Admission: RE | Admit: 2020-02-27 | Discharge: 2020-02-27 | Disposition: A | Discharge status: Home / Self Care | Payer: No Typology Code available for payment source | Source: Ambulatory Visit | Attending: Physician Assistant | Admitting: Physician Assistant

## 2020-02-27 ENCOUNTER — Telehealth: Payer: Self-pay

## 2020-02-27 DIAGNOSIS — K7469 Other cirrhosis of liver: Secondary | ICD-10-CM

## 2020-02-27 NOTE — Telephone Encounter (Signed)
-----   Message from Yevette Edwards, RN sent at 02/21/2020 12:35 PM EDT ----- Regarding: Labs Repeat BMET, order in epic. Notify patient via My Chart.

## 2020-02-27 NOTE — Telephone Encounter (Signed)
Notified patient via My Chart that she is due for repeat labs.

## 2020-02-28 ENCOUNTER — Other Ambulatory Visit (INDEPENDENT_AMBULATORY_CARE_PROVIDER_SITE_OTHER): Payer: No Typology Code available for payment source

## 2020-02-28 DIAGNOSIS — E876 Hypokalemia: Secondary | ICD-10-CM | POA: Diagnosis not present

## 2020-02-28 DIAGNOSIS — K7460 Unspecified cirrhosis of liver: Secondary | ICD-10-CM | POA: Diagnosis not present

## 2020-02-28 DIAGNOSIS — R188 Other ascites: Secondary | ICD-10-CM

## 2020-02-28 LAB — BASIC METABOLIC PANEL
BUN: 10 mg/dL (ref 6–23)
CO2: 25 mEq/L (ref 19–32)
Calcium: 7.7 mg/dL — ABNORMAL LOW (ref 8.4–10.5)
Chloride: 107 mEq/L (ref 96–112)
Creatinine, Ser: 0.83 mg/dL (ref 0.40–1.20)
GFR: 78.37 mL/min (ref >60.00)
Glucose, Bld: 95 mg/dL (ref 70–99)
Potassium: 3.2 mEq/L — ABNORMAL LOW (ref 3.5–5.1)
Sodium: 139 mEq/L (ref 135–145)

## 2020-03-02 ENCOUNTER — Other Ambulatory Visit: Payer: Self-pay

## 2020-03-02 DIAGNOSIS — K7460 Unspecified cirrhosis of liver: Secondary | ICD-10-CM

## 2020-03-02 DIAGNOSIS — E876 Hypokalemia: Secondary | ICD-10-CM

## 2020-03-02 DIAGNOSIS — R188 Other ascites: Secondary | ICD-10-CM

## 2020-03-05 ENCOUNTER — Encounter: Payer: Self-pay | Admitting: *Deleted

## 2020-03-05 ENCOUNTER — Other Ambulatory Visit: Payer: Self-pay

## 2020-03-05 ENCOUNTER — Ambulatory Visit
Admission: RE | Admit: 2020-03-05 | Discharge: 2020-03-05 | Disposition: A | Discharge status: Home / Self Care | Payer: No Typology Code available for payment source | Source: Ambulatory Visit | Attending: Physician Assistant | Admitting: Physician Assistant

## 2020-03-05 HISTORY — PX: IR RADIOLOGIST EVAL & MGMT: IMG5224

## 2020-03-05 NOTE — Progress Notes (Addendum)
Chief Complaint: Follow up for refractory ascites, NASH cirrhosis, SP TIPS shunt   Referring Physician(s): Dr. Havery Moros PCP: Dr. [name]  History of Present Illness: Meagan Peck is a 59 y.o. female presenting as a scheduled follow up to the clinic SP treatment for refractory ascites with DIPS.   She joins [place] today virtually on the telephone.  We confirmed her identity with 2 personal identifiers.   Meagan Peck was recently treated as in inpatient at [facility] with direct trans-caval intra-hepatic portal-systemic shunt (DIPS, analagous to TIPS).  This was performed 01/14/2020.    She has a history of NASH cirrhosis contributing to portal htn, refractory ascites, with limited medical options given developing hepato-renal dysfunction and low sodium.    Before our DIPS was performed she had serial large volume paracentesis.  The range of volume removed on LVP after 08/01/19 was 5L - 13L.   Most performed were in the 7 - 10 L range.   The shunt was placed as inpt, when she was post treatment for her first episode of SBP, and MELD was 19.    Since her discharge on 01/22/2020, she tells me she has been doing "fair".  She is retaining some fluid describing to me that she is positive by about "30 pounds".  She denies any SOB or CP at rest.  She describes some early fatigue and SOB with activity.  She has swelling in her lower extremities that is worst at the end of the day, and partially resolves overnight while sleeping supine/left decub.    She has not been wearing any compression stockings.    She has had 3 paracentesis after the DIPS, with volumes of 3.6L, 3.2L, and 4.0L.    CMP on 01/14/2020: Sodium: 132 Potassium 4.8 BUN: 40 Creatinine: 1.38 Albumin 2.7 Tbili: 2.2 GFR: 42  BMP on 19/29/21: Sodium: 139 Potassium: 3.2 BUN: 10 Creatinine: 0.83 GFR: 78  Currently, she tells me she is taking Lasix 52m, twice a day, and amiloride 538m  She is not taking spironolactone.   She is taking potassium supplementation. She is not on fluid restriction.  She tells me she is not formally on salt restriction, but she tends to watch that she does not ingest too much.     Past Medical History:  Diagnosis Date  . Acute medial meniscus tear of right knee 12/10/2015  . Allergy   . Anasarca   . Arthritis    bil feet  . Ascites   . Cataract    bilateral - surgery to remove  . Cirrhosis ([facility]  . Diabetes mellitus without complication ([facility]   type 2 - last a1c was 5.2 per patient, no meds  . Dyspnea    when I have too much fluid  . Esophageal varices ([facility]  . GERD (gastroesophageal reflux disease)   . Heart murmur   . Hepatic cirrhosis ([facility]  . HLD (hyperlipidemia)    no meds  . Hypertension    no meds  . Iron deficiency anemia 09/26/2018    Past Surgical History:  Procedure Laterality Date  . BIOPSY  08/20/2019   Procedure: BIOPSY;  Surgeon: GuJackquline DenmarkMD;  [facility]: [facility] Service: Gastroenterology;;  . CARPAL TUNNEL RELEASE Right   . CESAREAN SECTION    . CHOLECYSTECTOMY    . COLONOSCOPY  08/2019  . ESOPHAGEAL BANDING N/A 09/19/2019   Procedure: ESOPHAGEAL BANDING;  Surgeon: ArYetta FlockMD;  [facility]: [facility];  Service:  Gastroenterology;  Laterality: N/A;  . ESOPHAGOGASTRODUODENOSCOPY (EGD) WITH PROPOFOL N/A 08/20/2019   Procedure: ESOPHAGOGASTRODUODENOSCOPY (EGD) WITH PROPOFOL;  Surgeon: Jackquline Denmark, MD;  Location: [place];  Service: Gastroenterology;  Laterality: N/A;  . ESOPHAGOGASTRODUODENOSCOPY (EGD) WITH PROPOFOL N/A 09/19/2019   Procedure: ESOPHAGOGASTRODUODENOSCOPY (EGD) WITH PROPOFOL;  Surgeon: Yetta Flock, MD;  Location: [facility];  Service: Gastroenterology;  Laterality: N/A;  . ESOPHAGOGASTRODUODENOSCOPY (EGD) WITH PROPOFOL N/A 01/16/2020   Procedure: ESOPHAGOGASTRODUODENOSCOPY (EGD) WITH PROPOFOL;  Surgeon: Jerene Bears, MD;  Location: [place] [facility];  Service: Endoscopy;  Laterality: N/A;  . EYE  SURGERY Bilateral    removed cataracts  . GASTRIC VARICES BANDING N/A 08/20/2019   Procedure: GASTRIC VARICES BANDING;  Surgeon: Jackquline Denmark, MD;  Location: [place];  Service: Gastroenterology;  Laterality: N/A;  . HOT HEMOSTASIS N/A 01/16/2020   Procedure: HOT HEMOSTASIS (ARGON PLASMA COAGULATION/BICAP);  Surgeon: Jerene Bears, MD;  Location: [facility] ENDOSCOPY;  Service: Endoscopy;  Laterality: N/A;  . IR PARACENTESIS  08/01/2019  . IR PARACENTESIS  08/19/2019  . IR PARACENTESIS  09/09/2019  . IR PARACENTESIS  09/23/2019  . IR PARACENTESIS  10/08/2019  . IR PARACENTESIS  10/23/2019  . IR PARACENTESIS  11/12/2019  . IR PARACENTESIS  11/21/2019  . IR PARACENTESIS  11/25/2019  . IR PARACENTESIS  12/05/2019  . IR PARACENTESIS  12/13/2019  . IR PARACENTESIS  12/20/2019  . IR PARACENTESIS  12/26/2019  . IR PARACENTESIS  12/31/2019  . IR PARACENTESIS  01/07/2020  . IR PARACENTESIS  01/10/2020  . IR PARACENTESIS  01/13/2020  . IR PARACENTESIS  01/14/2020  . IR PARACENTESIS  01/20/2020  . IR PARACENTESIS  01/30/2020  . IR PARACENTESIS  02/24/2020  . IR RADIOLOGIST EVAL & MGMT  10/30/2019  . IR RADIOLOGIST EVAL & MGMT  12/11/2019  . IR RADIOLOGIST EVAL & MGMT  12/24/2019  . IR TIPS  11/25/2019  . IR TIPS  01/14/2020  . KNEE ARTHROSCOPY Right 12/10/2015   Procedure: RIGHT KNEE ARTHROSCOPY WITH PARTIAL MEDIAL MENISCECTOMY;  Surgeon: Mcarthur Rossetti, MD;  Location: [facility];  Service: Orthopedics;  Laterality: Right;  . KNEE ARTHROSCOPY W/ MENISCAL REPAIR Bilateral   . MENISCUS REPAIR Left   . RADIOLOGY WITH ANESTHESIA N/A 11/25/2019   Procedure: TIPS;  Surgeon: Corrie Mckusick, DO;  Location: [place];  Service: Anesthesiology;  Laterality: N/A;  . RADIOLOGY WITH ANESTHESIA N/A 01/14/2020   Procedure: TIPS;  Surgeon: Corrie Mckusick, DO;  Location: [place];  Service: Anesthesiology;  Laterality: N/A;    Allergies: Penicillins, Atrovent [ipratropium], Naproxen, Quinine derivatives, and Sulfa  antibiotics  Medications: Prior to Admission medications   Medication Sig Start Date End Date Taking? Authorizing Provider  ACCU-CHEK FASTCLIX LANCETS MISC Check fsbs TID 08/09/16   [provider]  AMBULATORY NON FORMULARY MEDICATION Wedge pillow 01/27/20   Early, Coralee Pesa, NP  aMILoride (MIDAMOR) 5 MG tablet Take 1 tablet (5 mg total) by mouth daily. 02/13/20   Armbruster, Carlota Raspberry, MD  cetirizine (ZYRTEC) 10 MG tablet Take 1 tablet (10 mg total) by mouth daily. Patient taking differently: Take 10 mg by mouth daily as needed for allergies.  01/02/19   [name], DO  Cholecalciferol (VITAMIN D3) 5000 UNITS TABS Take 5,000 Units by mouth daily.     [provider]  ciprofloxacin (CIPRO) 500 MG tablet Take 1 tablet (500 mg total) by mouth daily. 02/20/20 03/21/20  Levin Erp, PA  ferrous sulfate 325 (65 FE) MG EC tablet Take 1 tablet (325  mg total) by mouth 2 (two) times daily with a meal. 09/26/18   [name], DO  furosemide (LASIX) 20 MG tablet Take 1 tablet (20 mg total) by mouth 2 (two) times daily. 02/17/20   Armbruster, Carlota Raspberry, MD  GENERLAC 10 GM/15ML SOLN Take 10 g by mouth daily. 01/22/20   [provider]  Javier Docker Oil 500 MG CAPS Take 500 mg by mouth daily.    [provider]  lactulose (CHRONULAC) 10 GM/15ML solution Take 15 mLs (10 g total) by mouth daily. 01/22/20   Jeralyn Bennett, MD  magnesium oxide (MAG-OX) 400 MG tablet Take 1 tablet (400 mg total) by mouth 2 (two) times daily. 01/02/19   [name], DO  pantoprazole (PROTONIX) 40 MG tablet Take 1 tablet (40 mg total) by mouth daily. 02/17/20   Armbruster, Carlota Raspberry, MD  potassium chloride 20 MEQ TBCR Take 20 mEq by mouth daily. Patient not taking: Reported on 02/20/2020 02/03/20   Early, Coralee Pesa, NP     Family History  Problem Relation Age of Onset  . Hypertension Mother   . COPD Mother   . Alcoholism Father   . Heart attack Father   . Diabetes Brother   .  Diabetes Paternal Grandmother   . Heart disease Paternal Grandmother   . Alcoholism Paternal Uncle   . Alcoholism Maternal Uncle   . Heart disease Maternal Grandmother   . Heart disease Maternal Grandfather   . Heart disease Paternal Grandfather     Social History   Socioeconomic History  . Marital status: Married    Spouse name: Demoni Parmar  . Number of children: 1  . Years of education: Not on file  . Highest education level: Associate degree: academic program  Occupational History  . Not on file  Tobacco Use  . Smoking status: Never Smoker  . Smokeless tobacco: Never Used  Vaping Use  . Vaping Use: Never used  Substance and Sexual Activity  . Alcohol use: No  . Drug use: No  . Sexual activity: Yes    Partners: Male    Birth control/protection: None, Post-menopausal  Other Topics Concern  . Not on file  Social History Narrative  . Not on file   Social Determinants of Health   Financial Resource Strain:   . Difficulty of Paying Living Expenses: Not on file  Food Insecurity:   . Worried About Charity fundraiser in the Last Year: Not on file  . Ran Out of Food in the Last Year: Not on file  Transportation Needs:   . Lack of Transportation (Medical): Not on file  . Lack of Transportation (Non-Medical): Not on file  Physical Activity:   . Days of Exercise per Week: Not on file  . Minutes of Exercise per Session: Not on file  Stress:   . Feeling of Stress : Not on file  Social Connections:   . Frequency of Communication with Friends and Family: Not on file  . Frequency of Social Gatherings with Friends and Family: Not on file  . Attends Religious Services: Not on file  . Active Member of Clubs or Organizations: Not on file  . Attends Archivist Meetings: Not on file  . Marital Status: Not on file       Review of Systems  Review of Systems: A 12 point ROS discussed and pertinent positives are indicated in the HPI above.  All other systems are  negative.  Physical Exam No direct physical exam  was performed (except for noted visual exam findings with Video Visits).    Vital Signs: LMP 12/03/2013 (LMP Unknown)   Imaging: US ABDOMINAL PELVIC ART/VENT FLOW DOPPLER  Result Date: 02/24/2020 CLINICAL DATA:  59 year old female with a history Nash/cryptogenic cirrhosis, status post DIPS 01/14/2020. EXAM: DUPLEX ULTRASOUND OF LIVER AND TIPS SHUNT TECHNIQUE: Color and duplex Doppler ultrasound was performed to evaluate the hepatic in-flow and out-flow vessels. COMPARISON:  01/14/2020 FINDINGS: Portal Vein Velocities Main:  59 cm/sec Right:  974 cm/sec Left:  61 cm/sec TIPS Stent Velocities Proximal:  74 cm/sec Mid:  158 Distal:  101 cm/sec Hepatic Vein Velocities Right:  30 cm/sec Mid:  101 cm/sec Left:  Not visualized cm/sec Splenic Vein: 50 Superior Mesenteric Vein: Not visualized Hepatic Artery: 86 centimeter/second Ascities: Ascites present Varices: No visualized varices Other findings: None IMPRESSION: Patent porto-systemic shunt stent (DIPS). Ascites present on the study. Signed, Dulcy Fanny. Dellia Nims, RPVI [facility] Electronically Signed   By: Corrie Mckusick D.O.   On: 02/24/2020 11:29   IR Paracentesis  Result Date: 02/24/2020 INDICATION: Patient with a history of cirrhosis, status post TIPS, with recurrent ascites. Interventional radiology asked to perform a therapeutic paracentesis. EXAM: ULTRASOUND GUIDED PARACENTESIS MEDICATIONS: 1% lidocaine 15 mL COMPLICATIONS: None immediate. PROCEDURE: Informed written consent was obtained from the patient after a discussion of the risks, benefits and alternatives to treatment. A timeout was performed prior to the initiation of the procedure. Initial ultrasound scanning demonstrates a large amount of ascites within the right lower abdominal quadrant. The right lower abdomen was prepped and draped in the usual sterile fashion. 1% lidocaine was  used for local anesthesia. Following this, a 19 gauge, 10-cm, Yueh catheter was introduced. An ultrasound image was saved for documentation purposes. The paracentesis was performed. The catheter was removed and a dressing was applied. The patient tolerated the procedure well without immediate post procedural complication. FINDINGS: A total of approximately 4 L of clear yellow fluid was removed. IMPRESSION: Successful ultrasound-guided paracentesis yielding 4 liters of peritoneal fluid. Read by: Soyla Dryer, NP Electronically Signed   By: Markus Daft M.D.   On: 02/24/2020 12:46    Labs:  CBC: Recent Labs    01/20/20 0052 01/21/20 0058 01/22/20 0429 01/27/20 1513  WBC 7.9 7.5 6.1 4.7  HGB 9.9* 9.8* 9.0* 9.5*  HCT 30.3* 28.6* 27.1* 27.4*  PLT 110* 110* 104* 92*    COAGS: Recent Labs    11/25/19 0630 12/20/19 0624 01/10/20 0434 01/10/20 0717 01/14/20 0431 01/27/20 1513  INR 1.5*   < > 1.6* 1.4* 1.6* 1.2*  APTT 35  --   --   --   --   --    < > = values in this interval not displayed.    BMP: Recent Labs    07/22/19 1130 01/20/20 0052 01/20/20 0052 01/21/20 0058 01/21/20 0058 01/22/20 0429 01/27/20 1513 01/30/20 1255 02/06/20 1530 02/12/20 1539 02/20/20 1323 02/28/20 1403  NA   < > 133*   < > 130*   < > 127* 137   < > 137 137 136 139  K   < > 5.0   < > 5.2*   < > 5.4* 4.5   < > 3.6 3.4* 3.1* 3.2*  CL   < > 103   < > 102   < > 100 110   < > 107 108 107 107  CO2   < > 24   < > 21*   < >  21* 22   < > 25 23 23 25   GLUCOSE   < > 134*   < > 128*   < > 105* 88   < > 220* 144* 99 95  BUN   < > 22*   < > 17   < > 15 20   < > 13 11 9 10   CALCIUM   < > 8.5*   < > 8.1*   < > 8.0* 8.0*   < > 7.7* 7.6* 7.6* 7.7*  CREATININE   < > 0.96   < > 0.91   < > 0.91 0.95   < > 1.04 0.88 0.85 0.83  GFRNONAA  --  >60  --  >60  --  >60 66  --   --   --   --   --   GFRAA  --  >60  --  >60  --  >60 77  --   --   --   --   --    < > = values in this interval not displayed.    LIVER  FUNCTION TESTS: Recent Labs    01/19/20 0957 01/20/20 0052 01/21/20 0058 01/22/20 0429  BILITOT 2.5* 2.2* 2.5* 2.7*  AST 50* 52* 43* 48*  ALT 12 12 10 9   ALKPHOS 108 118 107 108  PROT 4.6* 4.9* 4.7* 4.6*  ALBUMIN 2.4* 2.6* 2.5* 2.4*    TUMOR MARKERS: Recent Labs    07/31/19 1154  AFPTM 2.3    Assessment and Plan:  Meagan Corriveau is 59 yo female with NASH cirrhosis, portal HTN and refractory ascites with hepato-renal dysfunction, SP direct intra-hepatic portal-systemic shunt placement for treatment.   Since her procedure on 01/14/2020, her labs have improved, with normal sodium, creatinine, BUN, GFR.  She has low potassium on the last.    She has also seen a decrease in the maximum volume of ascites on 3 separate LVP's after the DIPS, 3.6L, 3.2L, 4.0L.  Typical volumes previously were 2 to 3 times this volume.    Despite these improvements, she tells me she is still uncomfortable with estimated 30lbs of additional fluid retention/3rd spacing.    I did recommend that she be sure to continue to walk to activate the calf-muscle pump, perhaps augment the exercise with calf-raises in the seated position, elevate legs when able, and consider compression stockings of her legs during the days.    Given her improved lab values I did ask her to follow up with her doctors to discuss the possibility of a more aggressive diuretic schedule.   We will see her back in about 6 weeks, and we can also consider CMP at that time to check her albumin, if she has not had any changes.   Plan: - We will see her back in 6 weeks after her PCP and GI follow up to check progress - I have advised her to consider compression stockings to augment lymph return of the lower extremities, as above.    Electronically Signed: Corrie Mckusick 03/05/2020, 11:57 AM   I spent a total of    25 Minutes in remote  clinical consultation, greater than 50% of which was counseling/coordinating care for portal HTN, SP  TIPS/DIPS for refractory ascites.    Visit type: Audio only (telephone). Audio (no video) only due to patient's lack of internet/smartphone capability. Alternative for in-person consultation at [facility], [address], [place], [place]. This visit type was conducted due to national recommendations for restrictions  regarding the COVID-19 Pandemic (e.g. social distancing).  This format is felt to be most appropriate for this patient at this time.  All issues noted in this document were discussed and addressed.

## 2020-03-09 ENCOUNTER — Telehealth: Payer: Self-pay

## 2020-03-09 ENCOUNTER — Other Ambulatory Visit: Payer: Self-pay

## 2020-03-09 ENCOUNTER — Other Ambulatory Visit (INDEPENDENT_AMBULATORY_CARE_PROVIDER_SITE_OTHER): Payer: No Typology Code available for payment source

## 2020-03-09 DIAGNOSIS — R188 Other ascites: Secondary | ICD-10-CM | POA: Diagnosis not present

## 2020-03-09 DIAGNOSIS — K7460 Unspecified cirrhosis of liver: Secondary | ICD-10-CM | POA: Diagnosis not present

## 2020-03-09 DIAGNOSIS — E876 Hypokalemia: Secondary | ICD-10-CM | POA: Diagnosis not present

## 2020-03-09 LAB — BASIC METABOLIC PANEL
BUN: 11 mg/dL (ref 6–23)
CO2: 24 mEq/L (ref 19–32)
Calcium: 7.5 mg/dL — ABNORMAL LOW (ref 8.4–10.5)
Chloride: 109 mEq/L (ref 96–112)
Creatinine, Ser: 0.85 mg/dL (ref 0.40–1.20)
GFR: 76.14 mL/min (ref >60.00)
Glucose, Bld: 140 mg/dL — ABNORMAL HIGH (ref 70–99)
Potassium: 3.3 mEq/L — ABNORMAL LOW (ref 3.5–5.1)
Sodium: 139 mEq/L (ref 135–145)

## 2020-03-09 MED ORDER — AMILORIDE HCL 5 MG PO TABS
5.0000 mg | ORAL_TABLET | Freq: Two times a day (BID) | ORAL | 1 refills | Status: DC
Start: 2020-03-09 — End: 2020-03-10

## 2020-03-09 NOTE — Telephone Encounter (Signed)
Noted  

## 2020-03-09 NOTE — Telephone Encounter (Signed)
-----   Message from Yetta Flock, MD sent at 03/06/2020  5:10 PM EDT ----- Herbert Seta can you call this patient on Monday to make sure she goes to the lab for a follow up BMET.  If this looks stable please tell her I would like to increase her diuretics to see how she handles it. I have spoken with Dr. Earleen Newport about this. Thanks ----- Message ----- From: Corrie Mckusick, DO Sent: 03/05/2020  11:51 AM EDT To: Yetta Flock, MD  Richardson Landry, good day.   Had [employer] back today for follow up.  TIPS is flowing on duplex, yet we are frustrated by residual 3rd spacing.    She tells me she is on 40mg  daily of lasix and amiloride, no spironolactone now.    We have seen nice response of her sodium level, now normal, as well as normal creatinine.    Do you think a more aggressive diuretic strategy might help?   We often see a delay with the maximum benefit after TIPS shunt, and we are only 6 weeks out, but I know she is uncomfortable.    Thank you so much.  York Cerise

## 2020-03-09 NOTE — Progress Notes (Signed)
Refill request from [facility] for amiloride 5 mg increased to BID.  Contradiction with Potassium Chloride? Please advise

## 2020-03-09 NOTE — Progress Notes (Signed)
Please see BMET result note that I just sent to you about details about this. Going to slowly titrate up amiloride dosing as she tolerates. Thanks

## 2020-03-09 NOTE — Telephone Encounter (Signed)
Spoke with pt and she is coming for lab today.

## 2020-03-10 ENCOUNTER — Other Ambulatory Visit: Payer: Self-pay | Admitting: Osteopathic Medicine

## 2020-03-10 ENCOUNTER — Other Ambulatory Visit: Payer: Self-pay

## 2020-03-10 DIAGNOSIS — K7460 Unspecified cirrhosis of liver: Secondary | ICD-10-CM

## 2020-03-10 DIAGNOSIS — R188 Other ascites: Secondary | ICD-10-CM

## 2020-03-10 DIAGNOSIS — Z79899 Other long term (current) drug therapy: Secondary | ICD-10-CM

## 2020-03-10 DIAGNOSIS — E119 Type 2 diabetes mellitus without complications: Secondary | ICD-10-CM

## 2020-03-10 DIAGNOSIS — Z794 Long term (current) use of insulin: Secondary | ICD-10-CM

## 2020-03-10 MED ORDER — AMILORIDE HCL 5 MG PO TABS: ORAL_TABLET | ORAL | 1 refills | Status: DC

## 2020-03-10 MED ORDER — FUROSEMIDE 20 MG PO TABS: ORAL_TABLET | ORAL | 1 refills | Status: DC

## 2020-03-10 MED FILL — aMILoride HCL 5 MG TABS: 5 | 30 days supply | Qty: 90 | Fill #0

## 2020-03-10 MED FILL — MAGNESIUM OXIDE 400 MG TAB: 400 (240 MG | 60 days supply | Qty: 120 | Fill #0

## 2020-03-10 NOTE — Progress Notes (Signed)
See lab result note regarding medication changes

## 2020-03-10 NOTE — Progress Notes (Signed)
See result note.  

## 2020-03-11 MED FILL — FUROSEMIDE 20 MG TAB: 20 | 30 days supply | Qty: 90 | Fill #0

## 2020-03-18 MED FILL — CIPROFLOXACIN HCL 500 MG TA: 500 | 30 days supply | Qty: 30 | Fill #1

## 2020-03-20 ENCOUNTER — Other Ambulatory Visit (INDEPENDENT_AMBULATORY_CARE_PROVIDER_SITE_OTHER): Payer: No Typology Code available for payment source

## 2020-03-20 DIAGNOSIS — K7460 Unspecified cirrhosis of liver: Secondary | ICD-10-CM

## 2020-03-20 DIAGNOSIS — R188 Other ascites: Secondary | ICD-10-CM

## 2020-03-20 LAB — BASIC METABOLIC PANEL
BUN: 9 mg/dL (ref 6–23)
CO2: 26 mEq/L (ref 19–32)
Calcium: 7.5 mg/dL — ABNORMAL LOW (ref 8.4–10.5)
Chloride: 108 mEq/L (ref 96–112)
Creatinine, Ser: 0.88 mg/dL (ref 0.40–1.20)
GFR: 73.02 mL/min (ref >60.00)
Glucose, Bld: 127 mg/dL — ABNORMAL HIGH (ref 70–99)
Potassium: 3.6 mEq/L (ref 3.5–5.1)
Sodium: 138 mEq/L (ref 135–145)

## 2020-03-23 ENCOUNTER — Other Ambulatory Visit: Payer: Self-pay

## 2020-03-23 DIAGNOSIS — R188 Other ascites: Secondary | ICD-10-CM

## 2020-03-23 DIAGNOSIS — K7460 Unspecified cirrhosis of liver: Secondary | ICD-10-CM

## 2020-04-02 ENCOUNTER — Telehealth: Payer: Self-pay

## 2020-04-02 NOTE — Telephone Encounter (Signed)
-----   Message from Yevette Edwards, RN sent at 03/23/2020  9:53 AM EST ----- Regarding: Labs Repeat BMET, order in epic

## 2020-04-02 NOTE — Telephone Encounter (Signed)
Patient has been notified via My Chart.

## 2020-04-06 ENCOUNTER — Other Ambulatory Visit (INDEPENDENT_AMBULATORY_CARE_PROVIDER_SITE_OTHER): Payer: No Typology Code available for payment source

## 2020-04-06 ENCOUNTER — Other Ambulatory Visit: Payer: Self-pay | Admitting: Osteopathic Medicine

## 2020-04-06 DIAGNOSIS — R188 Other ascites: Secondary | ICD-10-CM

## 2020-04-06 DIAGNOSIS — K7460 Unspecified cirrhosis of liver: Secondary | ICD-10-CM | POA: Diagnosis not present

## 2020-04-06 LAB — BASIC METABOLIC PANEL
BUN: 11 mg/dL (ref 6–23)
CO2: 24 mEq/L (ref 19–32)
Calcium: 7.7 mg/dL — ABNORMAL LOW (ref 8.4–10.5)
Chloride: 106 mEq/L (ref 96–112)
Creatinine, Ser: 0.89 mg/dL (ref 0.40–1.20)
GFR: 72.02 mL/min (ref >60.00)
Glucose, Bld: 145 mg/dL — ABNORMAL HIGH (ref 70–99)
Potassium: 3.1 mEq/L — ABNORMAL LOW (ref 3.5–5.1)
Sodium: 138 mEq/L (ref 135–145)

## 2020-04-06 MED FILL — aMILoride HCL 5 MG TABS: 5 | 30 days supply | Qty: 90 | Fill #1

## 2020-04-06 MED FILL — FUROSEMIDE 20 MG TAB: 20 | 30 days supply | Qty: 90 | Fill #1

## 2020-04-07 ENCOUNTER — Other Ambulatory Visit: Payer: Self-pay

## 2020-04-07 DIAGNOSIS — K7460 Unspecified cirrhosis of liver: Secondary | ICD-10-CM

## 2020-04-07 DIAGNOSIS — E876 Hypokalemia: Secondary | ICD-10-CM

## 2020-04-07 DIAGNOSIS — R188 Other ascites: Secondary | ICD-10-CM

## 2020-04-09 ENCOUNTER — Ambulatory Visit (INDEPENDENT_AMBULATORY_CARE_PROVIDER_SITE_OTHER)
Admission: RE | Admit: 2020-04-09 | Discharge: 2020-04-09 | Disposition: A | Discharge status: Home / Self Care | Payer: No Typology Code available for payment source | Source: Ambulatory Visit | Attending: Physician Assistant | Admitting: Physician Assistant

## 2020-04-09 ENCOUNTER — Other Ambulatory Visit: Payer: Self-pay | Admitting: Physician Assistant

## 2020-04-09 ENCOUNTER — Ambulatory Visit: Payer: No Typology Code available for payment source | Admitting: Physician Assistant

## 2020-04-09 ENCOUNTER — Other Ambulatory Visit: Payer: Self-pay

## 2020-04-09 ENCOUNTER — Encounter: Payer: Self-pay | Admitting: Physician Assistant

## 2020-04-09 VITALS — BP 118/54 | HR 72 | Ht <= 58 in | Wt 194.0 lb

## 2020-04-09 DIAGNOSIS — K7460 Unspecified cirrhosis of liver: Secondary | ICD-10-CM

## 2020-04-09 DIAGNOSIS — K7290 Hepatic failure, unspecified without coma: Secondary | ICD-10-CM | POA: Diagnosis not present

## 2020-04-09 DIAGNOSIS — K7581 Nonalcoholic steatohepatitis (NASH): Secondary | ICD-10-CM | POA: Diagnosis not present

## 2020-04-09 DIAGNOSIS — R601 Generalized edema: Secondary | ICD-10-CM

## 2020-04-09 DIAGNOSIS — R059 Cough, unspecified: Secondary | ICD-10-CM

## 2020-04-09 DIAGNOSIS — R188 Other ascites: Secondary | ICD-10-CM

## 2020-04-09 MED ORDER — AMILORIDE HCL 5 MG PO TABS: ORAL_TABLET | ORAL | 2 refills | Status: DC

## 2020-04-09 NOTE — Patient Instructions (Signed)
If you are age 57 or older, your body mass index should be between 23-30. Your Body mass index is 40.9 kg/m. If this is out of the aforementioned range listed, please consider follow up with your Primary Care Provider.  If you are age 27 or younger, your body mass index should be between 19-25. Your Body mass index is 40.9 kg/m. If this is out of the aformentioned range listed, please consider follow up with your Primary Care Provider.   We have sent the following medications to your pharmacy for you to pick up at your convenience: Increase Amiloride to 20 mg in the morning and 10 mg in the evening.  Please call your hepatologist  Come back to recheck labs in 7 days   You will go down stair today for a Chest X-ray.  Thank you for choosing me and [facility].  Ellouise Newer, PA-C

## 2020-04-09 NOTE — Progress Notes (Signed)
Agree with assessment and plan as outlined.  Very difficult situation, she has retained fluid, anasarca despite DIPS.  She has recently seen IR in follow-up about this, they think it may take more time for this to take full effect. Recent Echo okay. Historically she has had side effects and did not tolerate higher dosing of diuretics however she has tolerated them recently and we have been slowly increasing the dose.  Agree with increasing amiloride slowly at this time, will increase a.m. dose to 20 mg, continue 10 mg every afternoon, along with present lasix dosing and K supplementation. Repeat BMET next week, if stable will continue to escalate dosing. I have previously referred her to Hepatology and encourage her to follow through with that if possible. We will continue to keep an eye on this and hopefully with more time and higher dosing of diuretics she does okay. If worsening despite this she should call us. I will see her in a few weeks in the office myself for reassessment.

## 2020-04-09 NOTE — Progress Notes (Signed)
Chief Complaint: Liver cirrhosis secondary to Meagan Peck  HPI:    Meagan Peck is a 59 year old Caucasian female, known to Dr. Havery Peck, with a past medical history as listed below including cirrhosis secondary to NASH status post DIPS, who was referred to me by Meagan Peck for follow-up of her cirrhosis secondary to Meagan Peck.      01/09/2020-01/22/2020 hospitalization for SBP.  CT of the abdomen pelvis 01/21/2020 showed moderate to large bilateral pleural effusions, cirrhosis with stigmata of portal hypertension status post DIPS, stent appeared to be grossly well-positioned, moderate volume abdominal ascites, anasarca and aortic atherosclerosis.  01/22/2020 we saw the patient last in hospital and she had persistent abdominal pain that was unrelieved with a paracentesis 2 days prior.  She had resolution of SBP.  Paracentesis 01/20/2020 with 3.6 L removal of fluid.  Was discussed the [facility] was not in her network for liver transplant.  Continued with thrombocytopenia but this was improved and noncritical.  Continued on ciprofloxacin 500 mg p.o. daily, lactulose 10 g p.o. daily and Protonix 40 mg p.o. daily.    01/30/2020 paracentesis with successful ultrasound-guided paracentesis yielding 3.2 L of peritoneal fluid.    02/12/2020 patient had a potassium minimally decreased at 3.4.    02/20/2020 patient seen in clinic and described she continued to have fluid issues which is moving into her arms and legs.  She was taking Lasix 20 mg twice a day and amiloride 5 mg daily.  Also continue lactulose and pantoprazole.  She is trying to connect with the clinic to get a liver transplant.  It was discussed that her case is very complicated with continual fluid buildup regardless of DI PS and diuretics, she was scheduled for repeat therapeutic paracentesis and continued on furosemide 20 mg twice daily and amiloride 5 mg daily.  Also continued on lactulose 15 mL daily and pantoprazole 40 mg daily.  Refilled her  ciprofloxacin 500 mg p.o. daily which she was taking for SBP prophylaxis.  Dr. Havery Peck recommended that her amiloride to be increased to 5 mg twice daily.  Repeat BMP next week.  She was referred to a transplant center.    02/28/2020 BMP with stable potassium.  This is slightly low.  She was told to take Lasix 20 mg twice a day and amiloride 5 mg twice a day.  Also added back K-Dur 20 mEq once daily.    03/09/2020 labs showed a stable renal function.  She was told to increase Lasix to 40 mg in the morning and continue 20 mg in the evening and increase amiloride to 10 mg in a.m. and 5 mg in the evening as well as continue K-Dur 20 mEq daily.    03/20/2020 BMP was normal.  She was told to continue her current dosing.  Patient stated she still had swelling so her Lasix was increased to 40 mg twice a day and amiloride 10 mg twice away and continued on K-Dur 20 mEq/day.    04/06/2020 potassium dropped to 3.1.  She was continued on current medications.  She was told to increase K-Dur to twice daily.    Today, the patient presents to clinic accompanied by her husband.  She tells me that she still has swelling.  Explains that over the weekend she lost 3 pounds but then she gained it all back over the last few days.  Tells me she is not really eating anything much but Jell-O.  Tries to look out for sodium.  Overall she does not  feel like anything is helping.  She continues with some fluid in her arms and legs but "my arms are not weeping anymore".  Continues to feel like her abdomen is distended with a lot of fluid.      Also describes a cough when she is changing positions like going from laying down to sitting up which has been occurring ever since she was in the hospital.    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  Her work is encouraging her to get her productivity back up or they may fire her.    Denies fever, chills or change in bowel  habits.  Endoscopic history: EGD 09/19/19 - - Esophageal varices noted as outlined above - 5 bands placed resulting in deflation. - Normal stomach. - Benign small polyp of the duodenum previously biopsied, not biopsied today. - Normal duodenum otherwise  EGD 08/20/19 - Grade II and large (>5 mm) esophageal varices. Banded x 5 - Portal hypertensive gastropathy. - Single nodule in duodenal bulb. Biopsied.  Colonoscopy4/13/21 -A few small, nonbleeding sigmoid, descending, IC valve AVMs, were not treated or ablated. Small internal hemorrhoids.  Echo 07/25/19 - EF 60-65%  Past Medical History:  Diagnosis Date  . Acute medial meniscus tear of right knee 12/10/2015  . Allergy   . Anasarca   . Arthritis    bil feet  . Ascites   . Cataract    bilateral - surgery to remove  . Cirrhosis ([place])   . Diabetes mellitus without complication ([place])    type 2 - last a1c was 5.2 per patient, no meds  . Dyspnea    when I have too much fluid  . Esophageal varices ([place])   . GERD (gastroesophageal reflux disease)   . Heart murmur   . Hepatic cirrhosis ([place])   . HLD (hyperlipidemia)    no meds  . Hypertension    no meds  . Iron deficiency anemia 09/26/2018    Past Surgical History:  Procedure Laterality Date  . BIOPSY  08/20/2019   Procedure: BIOPSY;  Surgeon: Jackquline Denmark, MD;  Location: [place];  Service: Gastroenterology;;  . CARPAL TUNNEL RELEASE Right   . CESAREAN SECTION    . CHOLECYSTECTOMY    . COLONOSCOPY  08/2019  . ESOPHAGEAL BANDING N/A 09/19/2019   Procedure: ESOPHAGEAL BANDING;  Surgeon: Yetta Flock, MD;  Location: [facility];  Service: Gastroenterology;  Laterality: N/A;  . ESOPHAGOGASTRODUODENOSCOPY (EGD) WITH PROPOFOL N/A 08/20/2019   Procedure: ESOPHAGOGASTRODUODENOSCOPY (EGD) WITH PROPOFOL;  Surgeon: Jackquline Denmark, MD;  Location: [place];  Service: Gastroenterology;  Laterality: N/A;  . ESOPHAGOGASTRODUODENOSCOPY (EGD) WITH PROPOFOL N/A  09/19/2019   Procedure: ESOPHAGOGASTRODUODENOSCOPY (EGD) WITH PROPOFOL;  Surgeon: Yetta Flock, MD;  Location: [facility];  Service: Gastroenterology;  Laterality: N/A;  . ESOPHAGOGASTRODUODENOSCOPY (EGD) WITH PROPOFOL N/A 01/16/2020   Procedure: ESOPHAGOGASTRODUODENOSCOPY (EGD) WITH PROPOFOL;  Surgeon: Jerene Bears, MD;  Location: [place] [facility];  Service: Endoscopy;  Laterality: N/A;  . EYE SURGERY Bilateral    removed cataracts  . GASTRIC VARICES BANDING N/A 08/20/2019   Procedure: GASTRIC VARICES BANDING;  Surgeon: Jackquline Denmark, MD;  Location: [place];  Service: Gastroenterology;  Laterality: N/A;  . HOT HEMOSTASIS N/A 01/16/2020   Procedure: HOT HEMOSTASIS (ARGON PLASMA COAGULATION/BICAP);  Surgeon: Jerene Bears, MD;  Location: [facility];  Service: Endoscopy;  Laterality: N/A;  . IR PARACENTESIS  08/01/2019  . IR PARACENTESIS  08/19/2019  .  IR PARACENTESIS  09/09/2019  . IR PARACENTESIS  09/23/2019  . IR PARACENTESIS  10/08/2019  . IR PARACENTESIS  10/23/2019  . IR PARACENTESIS  11/12/2019  . IR PARACENTESIS  11/21/2019  . IR PARACENTESIS  11/25/2019  . IR PARACENTESIS  12/05/2019  . IR PARACENTESIS  12/13/2019  . IR PARACENTESIS  12/20/2019  . IR PARACENTESIS  12/26/2019  . IR PARACENTESIS  12/31/2019  . IR PARACENTESIS  01/07/2020  . IR PARACENTESIS  01/10/2020  . IR PARACENTESIS  01/13/2020  . IR PARACENTESIS  01/14/2020  . IR PARACENTESIS  01/20/2020  . IR PARACENTESIS  01/30/2020  . IR PARACENTESIS  02/24/2020  . IR RADIOLOGIST EVAL & MGMT  10/30/2019  . IR RADIOLOGIST EVAL & MGMT  12/11/2019  . IR RADIOLOGIST EVAL & MGMT  12/24/2019  . IR RADIOLOGIST EVAL & MGMT  03/05/2020  . IR TIPS  11/25/2019  . IR TIPS  01/14/2020  . KNEE ARTHROSCOPY Right 12/10/2015   Procedure: RIGHT KNEE ARTHROSCOPY WITH PARTIAL MEDIAL MENISCECTOMY;  Surgeon: Mcarthur Rossetti, MD;  Location: [facility];  Service: Orthopedics;  Laterality: Right;  . KNEE ARTHROSCOPY W/ MENISCAL REPAIR Bilateral   .  MENISCUS REPAIR Left   . RADIOLOGY WITH ANESTHESIA N/A 11/25/2019   Procedure: TIPS;  Surgeon: Corrie Mckusick, Peck;  Location: [place];  Service: Anesthesiology;  Laterality: N/A;  . RADIOLOGY WITH ANESTHESIA N/A 01/14/2020   Procedure: TIPS;  Surgeon: Corrie Mckusick, Peck;  Location: [place];  Service: Anesthesiology;  Laterality: N/A;    Current Outpatient Medications  Medication Sig Dispense Refill  . ACCU-CHEK FASTCLIX LANCETS MISC Check fsbs TID    . AMBULATORY NON FORMULARY MEDICATION Wedge pillow 1 each 0  . aMILoride (MIDAMOR) 5 MG tablet Take 2 tablets (10 mg) daily in the morning and 1 tablet (5 mg) daily at bedtime 90 tablet 1  . cetirizine (ZYRTEC) 10 MG tablet Take 1 tablet (10 mg total) by mouth daily. (Patient taking differently: Take 10 mg by mouth daily as needed for allergies. ) 90 tablet 3  . Cholecalciferol (VITAMIN D3) 5000 UNITS TABS Take 5,000 Units by mouth daily.     . ferrous sulfate 325 (65 FE) MG EC tablet Take 1 tablet (325 mg total) by mouth 2 (two) times daily with a meal. 180 tablet 1  . furosemide (LASIX) 20 MG tablet Take 2 tablets (40 mg) in the am and 1 tablet (20 mg) at bedtime 90 tablet 1  . GENERLAC 10 GM/15ML SOLN Take 10 g by mouth daily.    Javier Docker Oil 500 MG CAPS Take 500 mg by mouth daily.    Marland Kitchen lactulose (CHRONULAC) 10 GM/15ML solution Take 15 mLs (10 g total) by mouth daily. 473 mL 0  . Magnesium Oxide 400 (240 Mg) MG TABS TAKE 1 TABLET (400 MG TOTAL) BY MOUTH 2 (TWO) TIMES DAILY. 120 tablet 1  . pantoprazole (PROTONIX) 40 MG tablet Take 1 tablet (40 mg total) by mouth daily. 90 tablet 3  . potassium chloride 20 MEQ TBCR Take 20 mEq by mouth daily. (Patient not taking: Reported on 02/20/2020) 90 tablet 3   Current Facility-Administered Medications  Medication Dose Route Frequency Provider Last Rate Last Admin  . albumin human 25 % solution 25 g  25 g Intravenous Once Levin Erp, [place]        Allergies as of 04/09/2020 - Review Complete 02/24/2020   Allergen Reaction Noted  . Penicillins Anaphylaxis 02/01/2013  . Atrovent [ipratropium] Palpitations 08/11/2017  .  Naproxen Rash 02/01/2013  . Quinine derivatives Rash 04/14/2014  . Sulfa antibiotics Rash 02/01/2013    Family History  Problem Relation Age of Onset  . Hypertension Mother   . COPD Mother   . Alcoholism Father   . Heart attack Father   . Diabetes Brother   . Diabetes Paternal Grandmother   . Heart disease Paternal Grandmother   . Alcoholism Paternal Uncle   . Alcoholism Maternal Uncle   . Heart disease Maternal Grandmother   . Heart disease Maternal Grandfather   . Heart disease Paternal Grandfather     Social History   Socioeconomic History  . Marital status: Married    Spouse name: Meagan Peck  . Number of children: 1  . Years of education: Not on file  . Highest education level: Associate degree: academic program  Occupational History  . Not on file  Tobacco Use  . Smoking status: Never Smoker  . Smokeless tobacco: Never Used  Vaping Use  . Vaping Use: Never used  Substance and Sexual Activity  . Alcohol use: No  . Drug use: No  . Sexual activity: Yes    Partners: Male    Birth control/protection: None, Post-menopausal  Other Topics Concern  . Not on file  Social History Narrative  . Not on file   Social Determinants of Health   Financial Resource Strain: Not on file  Food Insecurity: Not on file  Transportation Needs: Not on file  Physical Activity: Not on file  Stress: Not on file  Social Connections: Not on file  Intimate Partner Violence: Not on file    Review of Systems:    Constitutional: No weight loss, fever or chills Cardiovascular: No chest pain Respiratory: +cough Gastrointestinal: See HPI and otherwise negative   Physical Exam:  Vital signs: BP (!) 118/54 (BP Location: Right Arm, Patient Position: Sitting, Cuff Size: Normal)   Pulse 72   Ht 4' 9.75" (1.467 m)   Wt 194 lb (88 kg)   LMP 12/03/2013 (LMP  Unknown)   SpO2 100%   BMI 40.90 kg/m   Constitutional:   Pleasant ill appearing Caucasian female appears to be in NAD, Well developed, Well nourished, alert and cooperative Respiratory: Respirations even and unlabored. Lungs clear to auscultation bilaterally.   No wheezes, crackles, or rhonchi.  Cardiovascular: Normal S1, S2. No MRG. Regular rate and rhythm. No peripheral edema, cyanosis or pallor.  Gastrointestinal:  Soft, moderate distension, nontender. No rebound or guarding. Normal bowel sounds. No appreciable masses or hepatomegaly. Rectal:  Not performed.  Msk:  Symmetrical without gross deformities. Without edema, no deformity or joint abnormality.  Neurologic:  Alert and  oriented x4;  grossly normal neurologically.  Skin:   Dry and intact without significant lesions or rashes. +pitting edema in arms and legs Psychiatric: Demonstrates good judgement and reason without abnormal affect or behaviors.  RELEVANT LABS AND IMAGING: CBC    Component Value Date/Time   WBC 4.7 01/27/2020 1513   RBC 3.07 (L) 01/27/2020 1513   HGB 9.5 (L) 01/27/2020 1513   HCT 27.4 (L) 01/27/2020 1513   PLT 92 (L) 01/27/2020 1513   MCV 89.3 01/27/2020 1513   MCH 30.9 01/27/2020 1513   MCHC 34.7 01/27/2020 1513   RDW 15.4 (H) 01/27/2020 1513   LYMPHSABS 696 (L) 01/27/2020 1513   MONOABS 0.8 01/07/2020 1932   EOSABS 179 01/27/2020 1513   BASOSABS 89 01/27/2020 1513    CMP     Component Value Date/Time  NA 138 04/06/2020 1614   K 3.1 (L) 04/06/2020 1614   CL 106 04/06/2020 1614   CO2 24 04/06/2020 1614   GLUCOSE 145 (H) 04/06/2020 1614   BUN 11 04/06/2020 1614   CREATININE 0.89 04/06/2020 1614   CREATININE 0.95 01/27/2020 1513   CALCIUM 7.7 (L) 04/06/2020 1614   PROT 4.6 (L) 01/22/2020 0429   ALBUMIN 2.4 (L) 01/22/2020 0429   AST 48 (H) 01/22/2020 0429   ALT 9 01/22/2020 0429   ALKPHOS 108 01/22/2020 0429   BILITOT 2.7 (H) 01/22/2020 0429   GFRNONAA 66 01/27/2020 1513   GFRAA 77  01/27/2020 1513    Assessment: 1.  Meagan Peck cirrhosis with ascites: Status post DIPS procedure 01/14/2020 requiring paracentesis since then as well as increasing of diuretics, also recent hospitalization for SBP now on prophylactic ciprofloxacin, currently tolerating Lasix 40 mg twice a day and amiloride 10 mg twice a day 2.  Anasarca 3.  Cough: Concern for pulmonary edema  Plan: 1.  Ordered a chest x-ray two-view today. 2.  Discussed case with Dr. Havery Peck as patient is typically nonresponsive to increases in diuretics.  Recommend that we increase Amiloride to 20 mg every morning and 10 mg nightly and recheck a BMP in a week.  Refills were also given of amiloride. 3.  Continue Lasix 40 mg twice a day and K-Dur 20 mEq twice a day 5.  Continue other medications as prescribed 6.  Renforced how important a low-sodium diet is for the patient. 7.  Encouraged her to call her insurance company and figure out which hepatologist she can go to because we are running out of options. 8.  Patient follow in clinic as recommended after labs above.  Ellouise Newer, PA-C [facility] 04/09/2020, 10:55 AM  Cc: Meagan Peck

## 2020-04-10 ENCOUNTER — Telehealth: Payer: Self-pay | Admitting: Physician Assistant

## 2020-04-10 NOTE — Telephone Encounter (Signed)
Received call report on Chest xray, report in epic.

## 2020-04-10 NOTE — Telephone Encounter (Signed)
Malachy Mood from [place] has a call report on this pt.

## 2020-04-14 ENCOUNTER — Other Ambulatory Visit: Payer: Self-pay

## 2020-04-14 DIAGNOSIS — R601 Generalized edema: Secondary | ICD-10-CM

## 2020-04-14 DIAGNOSIS — R188 Other ascites: Secondary | ICD-10-CM

## 2020-04-14 DIAGNOSIS — K7581 Nonalcoholic steatohepatitis (NASH): Secondary | ICD-10-CM

## 2020-04-14 DIAGNOSIS — R059 Cough, unspecified: Secondary | ICD-10-CM

## 2020-04-14 DIAGNOSIS — K7460 Unspecified cirrhosis of liver: Secondary | ICD-10-CM

## 2020-04-17 ENCOUNTER — Other Ambulatory Visit (INDEPENDENT_AMBULATORY_CARE_PROVIDER_SITE_OTHER): Payer: No Typology Code available for payment source

## 2020-04-17 DIAGNOSIS — R188 Other ascites: Secondary | ICD-10-CM | POA: Diagnosis not present

## 2020-04-17 DIAGNOSIS — K7460 Unspecified cirrhosis of liver: Secondary | ICD-10-CM | POA: Diagnosis not present

## 2020-04-17 DIAGNOSIS — E876 Hypokalemia: Secondary | ICD-10-CM

## 2020-04-17 LAB — BASIC METABOLIC PANEL WITH GFR
BUN: 12 mg/dL (ref 6–23)
CO2: 23 meq/L (ref 19–32)
Calcium: 7.8 mg/dL — ABNORMAL LOW (ref 8.4–10.5)
Chloride: 109 meq/L (ref 96–112)
Creatinine, Ser: 0.93 mg/dL (ref 0.40–1.20)
GFR: 68.3 mL/min
Glucose, Bld: 188 mg/dL — ABNORMAL HIGH (ref 70–99)
Potassium: 4 meq/L (ref 3.5–5.1)
Sodium: 137 meq/L (ref 135–145)

## 2020-04-20 ENCOUNTER — Other Ambulatory Visit: Payer: Self-pay

## 2020-04-20 ENCOUNTER — Telehealth: Payer: Self-pay | Admitting: Physician Assistant

## 2020-04-20 DIAGNOSIS — R188 Other ascites: Secondary | ICD-10-CM

## 2020-04-20 DIAGNOSIS — K7460 Unspecified cirrhosis of liver: Secondary | ICD-10-CM

## 2020-04-20 NOTE — Telephone Encounter (Signed)
Spoke with patient, see 04/17/20 result note for more information.

## 2020-04-20 NOTE — Telephone Encounter (Signed)
Patient returned your call in regards to results.

## 2020-04-23 ENCOUNTER — Other Ambulatory Visit: Payer: Self-pay | Admitting: Gastroenterology

## 2020-04-23 MED FILL — CIPROFLOXACIN HCL 500 MG TA: 500 | 30 days supply | Qty: 30 | Fill #2

## 2020-04-23 MED FILL — POTASSIUM CL ER 20 MEQ TAB: 20 | 90 days supply | Qty: 90 | Fill #1

## 2020-04-27 ENCOUNTER — Ambulatory Visit (HOSPITAL_COMMUNITY): Payer: No Typology Code available for payment source

## 2020-04-28 ENCOUNTER — Other Ambulatory Visit: Payer: Self-pay | Admitting: Gastroenterology

## 2020-04-28 ENCOUNTER — Other Ambulatory Visit: Payer: Self-pay | Admitting: Student

## 2020-04-28 ENCOUNTER — Other Ambulatory Visit: Payer: Self-pay

## 2020-04-28 MED ORDER — LACTULOSE 10 GM/15ML PO SOLN
10.0000 g | Freq: Every day | ORAL | 0 refills | Status: DC
Start: 2020-04-28 — End: 2020-09-19

## 2020-04-28 MED FILL — GENERLAC SOLUTION 10G/15ML: 10 | 32 days supply | Qty: 473 | Fill #0

## 2020-04-28 NOTE — Progress Notes (Signed)
Refill sent for lactulose

## 2020-04-29 ENCOUNTER — Other Ambulatory Visit: Payer: Self-pay

## 2020-04-29 ENCOUNTER — Encounter: Payer: Self-pay | Admitting: Osteopathic Medicine

## 2020-04-29 ENCOUNTER — Ambulatory Visit (INDEPENDENT_AMBULATORY_CARE_PROVIDER_SITE_OTHER): Payer: No Typology Code available for payment source | Admitting: Osteopathic Medicine

## 2020-04-29 VITALS — BP 148/73 | HR 101 | Temp 97.9°F | Wt 183.1 lb

## 2020-04-29 DIAGNOSIS — Z794 Long term (current) use of insulin: Secondary | ICD-10-CM

## 2020-04-29 DIAGNOSIS — K7581 Nonalcoholic steatohepatitis (NASH): Secondary | ICD-10-CM | POA: Diagnosis not present

## 2020-04-29 DIAGNOSIS — Z1231 Encounter for screening mammogram for malignant neoplasm of breast: Secondary | ICD-10-CM

## 2020-04-29 DIAGNOSIS — E119 Type 2 diabetes mellitus without complications: Secondary | ICD-10-CM | POA: Diagnosis not present

## 2020-04-29 DIAGNOSIS — Z0000 Encounter for general adult medical examination without abnormal findings: Secondary | ICD-10-CM

## 2020-04-29 LAB — POCT GLYCOSYLATED HEMOGLOBIN (HGB A1C): Hemoglobin A1C: 4.7 % (ref 4.0–5.6)

## 2020-04-29 NOTE — Patient Instructions (Signed)
General Preventive Care  Most recent routine screening labs: ordered.   Blood pressure goal 130/80 or less.   Tobacco: don't!   Alcohol: responsible moderation is ok for most adults - if you have concerns about your alcohol intake, please talk to me!   Exercise: as tolerated to reduce risk of cardiovascular disease and diabetes. Strength training will also prevent osteoporosis.   Mental health: if need for mental health care (medicines, counseling, other), or concerns about moods, please let me know!   Sexual / Reproductive health: if need for STD testing, or if concerns with libido/pain problems, please let me know!  Advanced Directive: Living Will and/or Healthcare Power of Attorney recommended for all adults, regardless of age or health.  Vaccines  Flu vaccine: recommended every fall.   Shingles vaccine: all done!   Pneumonia vaccines: booster after age 74.  Tetanus booster: every 10 years - due 2028  COVID vaccine: THANKS for getting your vaccine! :) Cancer screenings   Colon cancer screening: for everyone age 78-75. Colonoscopy available for all, many people also qualify for the Cologuard stool test   Breast cancer screening: mammogram annually after age 28.   Cervical cancer screening: Pap due!    Lung cancer screening: not needed for non-smokers  Infection screenings  . HIV and Hepatitis C screening: done  . Gonorrhea/Chlamydia: screening as needed . TB: certain at-risk populations Other . Bone Density Test: recommended for women at age 54

## 2020-04-29 NOTE — Progress Notes (Signed)
Meagan Peck is a 59 y.o. female who presents to  [place] [facility]  today, 04/29/20, seeking care for the following:  . Annual phsyical      ASSESSMENT & PLAN with other pertinent findings:  The primary encounter diagnosis was Annual physical exam. Diagnoses of Type 2 diabetes mellitus without complication, with long-term current use of insulin ([place]), NASH (nonalcoholic steatohepatitis), and Breast cancer screening by mammogram were also pertinent to this visit.   No results found for this or any previous visit (from the past 24 hour(s)).     Patient Instructions  General Preventive Care  Most recent routine screening labs: ordered.   Blood pressure goal 130/80 or less.   Tobacco: don't!   Alcohol: responsible moderation is ok for most adults - if you have concerns about your alcohol intake, please talk to me!   Exercise: as tolerated to reduce risk of cardiovascular disease and diabetes. Strength training will also prevent osteoporosis.   Mental health: if need for mental health care (medicines, counseling, other), or concerns about moods, please let me know!   Sexual / Reproductive health: if need for STD testing, or if concerns with libido/pain problems, please let me know!  Advanced Directive: Living Will and/or Healthcare Power of Attorney recommended for all adults, regardless of age or health.  Vaccines  Flu vaccine: recommended every fall.   Shingles vaccine: all done!   Pneumonia vaccines: booster after age 16.  Tetanus booster: every 10 years - due 2028  COVID vaccine: THANKS for getting your vaccine! :) Cancer screenings   Colon cancer screening: for everyone age 57-75. Colonoscopy available for all, many people also qualify for the Cologuard stool test   Breast cancer screening: mammogram annually after age 26.   Cervical cancer screening: Pap due!    Lung cancer screening: not needed for  non-smokers  Infection screenings  . HIV and Hepatitis C screening: done  . Gonorrhea/Chlamydia: screening as needed . TB: certain at-risk populations Other . Bone Density Test: recommended for women at age 71   Orders Placed This Encounter  Procedures  . MM 3D SCREEN BREAST BILATERAL  . CBC  . COMPLETE METABOLIC PANEL WITH GFR  . Lipid panel  . TSH  . POCT glycosylated hemoglobin (Hb A1C)    No orders of the defined types were placed in this encounter.      Follow-up instructions: Return in about 6 months (around 10/28/2020) for ROUTEIN CHECK UP - SONER IF NEEDED .                                         BP (!) 148/73   Pulse (!) 101   Temp 97.9 F (36.6 C)   Wt 183 lb 1.9 oz (83.1 kg)   LMP 12/03/2013 (LMP Unknown)   SpO2 100%   BMI 38.60 kg/m  Constitutional:  . VSS, see nurse notes . General Appearance: alert, well-developed, well-nourished, NAD Neck: . No masses, trachea midline . No thyroid enlargement/tenderness/mass appreciated Respiratory: . Normal respiratory effort . breath sounds normal, no wheeze/rhonchi/rales Cardiovascular: . S1/S2 normal, no murmur/rub/gallop auscultated . No lower extremity edema Gastrointestinal: . (+)visble ascites/distenion Musculoskeletal:  . Gait normal . No clubbing/cyanosis of digits Neurological: . No cranial nerve deficit on limited exam . Motor and sensation intact and symmetric Psychiatric: . Normal judgment/insight . Normal mood and  affect  Current Meds  Medication Sig  . ACCU-CHEK FASTCLIX LANCETS MISC Check fsbs TID  . AMBULATORY NON FORMULARY MEDICATION Wedge pillow  . aMILoride (MIDAMOR) 5 MG tablet Take 4 tablets in the morning and 2 tablets in the evening  . cetirizine (ZYRTEC) 10 MG tablet Take 1 tablet (10 mg total) by mouth daily. (Patient taking differently: Take 10 mg by mouth daily as needed for allergies.)  . Cholecalciferol (VITAMIN D3) 5000 UNITS TABS  Take 5,000 Units by mouth daily.   . ferrous sulfate 325 (65 FE) MG EC tablet Take 1 tablet (325 mg total) by mouth 2 (two) times daily with a meal.  . furosemide (LASIX) 20 MG tablet TAKE 2 TABLETS BY MOUTH EVERY MORNING AND 1 TABLET EVERY NIGHT AT BEDTIME  . Krill Oil 500 MG CAPS Take 500 mg by mouth daily.  Marland Kitchen lactulose (CHRONULAC) 10 GM/15ML solution Take 15 mLs (10 g total) by mouth daily. Please keep your appt with Dr. Havery Moros on Thursday, 04-30-20.  . Magnesium Oxide 400 (240 Mg) MG TABS TAKE 1 TABLET (400 MG TOTAL) BY MOUTH 2 (TWO) TIMES DAILY.  . pantoprazole (PROTONIX) 40 MG tablet Take 1 tablet (40 mg total) by mouth daily.  . potassium chloride SA (KLOR-CON) 20 MEQ tablet Take 20 mEq by mouth 2 (two) times daily.   Current Facility-Administered Medications for the 04/29/20 encounter (Office Visit) with [name], DO  Medication  . albumin human 25 % solution 25 g    Results for orders placed or performed in visit on 04/29/20 (from the past 72 hour(s))  POCT glycosylated hemoglobin (Hb A1C)     Status: None   Collection Time: 04/29/20  2:20 PM  Result Value Ref Range   Hemoglobin A1C 4.7 4.0 - 5.6 %   HbA1c POC (<> result, manual entry)     HbA1c, POC (prediabetic range)     HbA1c, POC (controlled diabetic range)      No results found.     All questions at time of visit were answered - patient instructed to contact office with any additional concerns or updates.  ER/RTC precautions were reviewed with the patient as applicable.   Please note: voice recognition software was used to produce this document, and typos may escape review. Please contact Dr. Sheppard Coil for any needed clarifications.

## 2020-04-30 ENCOUNTER — Encounter: Payer: Self-pay | Admitting: Gastroenterology

## 2020-04-30 ENCOUNTER — Ambulatory Visit: Payer: No Typology Code available for payment source | Admitting: Gastroenterology

## 2020-04-30 ENCOUNTER — Other Ambulatory Visit (INDEPENDENT_AMBULATORY_CARE_PROVIDER_SITE_OTHER): Payer: No Typology Code available for payment source

## 2020-04-30 VITALS — BP 110/50 | HR 72 | Ht <= 58 in | Wt 182.0 lb

## 2020-04-30 DIAGNOSIS — R601 Generalized edema: Secondary | ICD-10-CM

## 2020-04-30 DIAGNOSIS — K7460 Unspecified cirrhosis of liver: Secondary | ICD-10-CM | POA: Diagnosis not present

## 2020-04-30 DIAGNOSIS — R188 Other ascites: Secondary | ICD-10-CM | POA: Diagnosis not present

## 2020-04-30 DIAGNOSIS — R9389 Abnormal findings on diagnostic imaging of other specified body structures: Secondary | ICD-10-CM

## 2020-04-30 DIAGNOSIS — Z23 Encounter for immunization: Secondary | ICD-10-CM

## 2020-04-30 DIAGNOSIS — I8501 Esophageal varices with bleeding: Secondary | ICD-10-CM | POA: Diagnosis not present

## 2020-04-30 DIAGNOSIS — I8500 Esophageal varices without bleeding: Secondary | ICD-10-CM | POA: Diagnosis not present

## 2020-04-30 LAB — LIPID PANEL
Cholesterol: 121 mg/dL (ref 0–200)
HDL: 38.3 mg/dL — ABNORMAL LOW (ref >39.00)
LDL Cholesterol: 71 mg/dL (ref 0–99)
NonHDL: 82.53
Total CHOL/HDL Ratio: 3
Triglycerides: 56 mg/dL (ref 0.0–149.0)
VLDL: 11.2 mg/dL (ref 0.0–40.0)

## 2020-04-30 LAB — CBC WITH DIFFERENTIAL/PLATELET
Basophils Absolute: 0.1 10*3/uL (ref 0.0–0.1)
Basophils Relative: 1.5 % (ref 0.0–3.0)
Eosinophils Absolute: 0.3 10*3/uL (ref 0.0–0.7)
Eosinophils Relative: 8.6 % — ABNORMAL HIGH (ref 0.0–5.0)
HCT: 25.6 % — ABNORMAL LOW (ref 36.0–46.0)
Hemoglobin: 9.1 g/dL — ABNORMAL LOW (ref 12.0–15.0)
Lymphocytes Relative: 18 % (ref 12.0–46.0)
Lymphs Abs: 0.7 10*3/uL (ref 0.7–4.0)
MCHC: 35.5 g/dL (ref 30.0–36.0)
MCV: 91.4 fl (ref 78.0–100.0)
Monocytes Absolute: 0.3 10*3/uL (ref 0.1–1.0)
Monocytes Relative: 8.5 % (ref 3.0–12.0)
Neutro Abs: 2.4 10*3/uL (ref 1.4–7.7)
Neutrophils Relative %: 63.4 % (ref 43.0–77.0)
Platelets: 110 10*3/uL — ABNORMAL LOW (ref 150.0–400.0)
RBC: 2.8 Mil/uL — ABNORMAL LOW (ref 3.87–5.11)
RDW: 15.2 % (ref 11.5–15.5)
WBC: 3.7 10*3/uL — ABNORMAL LOW (ref 4.0–10.5)

## 2020-04-30 LAB — COMPREHENSIVE METABOLIC PANEL
ALT: 13 U/L (ref 0–35)
AST: 32 U/L (ref 0–37)
Albumin: 2.3 g/dL — ABNORMAL LOW (ref 3.5–5.2)
Alkaline Phosphatase: 97 U/L (ref 39–117)
BUN: 11 mg/dL (ref 6–23)
CO2: 25 mEq/L (ref 19–32)
Calcium: 7.6 mg/dL — ABNORMAL LOW (ref 8.4–10.5)
Chloride: 108 mEq/L (ref 96–112)
Creatinine, Ser: 0.87 mg/dL (ref 0.40–1.20)
GFR: 73.97 mL/min (ref >60.00)
Glucose, Bld: 147 mg/dL — ABNORMAL HIGH (ref 70–99)
Potassium: 3 mEq/L — ABNORMAL LOW (ref 3.5–5.1)
Sodium: 139 mEq/L (ref 135–145)
Total Bilirubin: 3.6 mg/dL — ABNORMAL HIGH (ref 0.2–1.2)
Total Protein: 5.3 g/dL — ABNORMAL LOW (ref 6.0–8.3)

## 2020-04-30 LAB — TSH: TSH: 4.4 u[IU]/mL (ref 0.35–4.50)

## 2020-04-30 MED FILL — aMILoride HCL 5 MG TABS: 5 | 30 days supply | Qty: 180 | Fill #0

## 2020-04-30 MED FILL — FUROSEMIDE 20 MG TAB: 20 | 30 days supply | Qty: 90 | Fill #0

## 2020-04-30 NOTE — Progress Notes (Signed)
HPI :  59 year old female here for a follow-up visit for cirrhosis.  She is had a serologic work-up that has been largely unremarkable and this has been suspected related to [facility] in the setting of diabetes vs. cryptogenic.  Her course has been complicated by severe ascites and large esophageal varices, and some HE.  She has not had problems with jaundice.   Main problematic issue for her this year has been ascites. On higher doses of diuretic she had hyperkalemia from Aldactone that was quite difficult to manage and required hospitalization.  She has been on Lasix monotherapy however has developed problems with hyponatremia and low potassium on 40 mg/day dosing.    On lower dosing she has had persistent ascites requiring large-volume paracentesis.  She has not had any evidence of variceal bleeding but has had 2 EGDs with banding in April and in May 2021 given the large varices and risk for bleeding, and that she cannot tolerate beta-blockade due to her refractory ascites and that her resting heart rate is around 60.  She does not drink any alcohol and has never drink any significant alcohol in the past.  She states she is compliant with a low-sodium diet.    Eventually due to refractory ascites she ended up having a DIPS procedure with IR (TIPS failed due to anatomy), and this remains patent on follow-up imaging with IR.  Her course most recently was complicated by hospitalization for SBP.  She has an allergy to sulfa so has been on chronic Cipro prophylactic therapy.  Given her problems with cirrhosis I have referred her to hepatology for transplant evaluation.  She was referred to [facility] months ago but her insurance has not approved this.  She remains pending to be seen by hepatology, she is discussing this issue with her insurance recently.  In the past 2 months generally she has been doing better.  We have been able to titrate back up her diuretics and she appears to be tolerating it.   Most recently she has been on Lasix 40 mg twice daily as well as amiloride 20 mg every morning and 10 mg every afternoon.  She also takes K-Dur 20 meq twice daily.  Her labs have been stable on this regimen and her ascites had been improving as well as anasarca.  Unfortunately she ran out of amiloride about a week ago, her pharmacy has not been able to refill it until she thinks today it should be ready.  She has continued to take Lasix plus potassium supplementation without the amiloride.  She had an echocardiogram on September 15 which showed an EF of 60 to 65%.  She does have a history of iron deficiency anemia with a EGD and colonoscopy done recently as below.  Suspect anemia could be related to portal hypertensive gastritis.  Of note she had a chest x-ray for cough on December 10 which showed some left basilar opacities and a small effusion.  We did discuss scheduling a CT scan of her chest, sounds like she missed that appointment on 1227 and it was not done.   Endoscopic history: EGD 09/19/19 -  - Esophageal varices noted as outlined above - 5 bands placed resulting in deflation. - Normal stomach. - Benign small polyp of the duodenum previously biopsied, not biopsied today. - Normal duodenum otherwise  EGD 08/20/19 - Grade II and large (> 5 mm) esophageal varices.  Banded x 5 - Portal hypertensive gastropathy. - Single nodule in duodenal bulb. Biopsied.  EGD 01/16/20 -  - small esophageal varices not amenable to banding - 2 small gastric AVMs  Colonoscopy 08/13/19 - A few small, nonbleeding sigmoid, descending, IC valve AVMs, were not treated or ablated. Small internal hemorrhoids.    Echo 01/15/2020 - EF 60 to 65%.    Past Medical History:  Diagnosis Date  . Acute medial meniscus tear of right knee 12/10/2015  . Allergy   . Anasarca   . Arthritis    bil feet  . Ascites   . Cataract    bilateral - surgery to remove  . Cirrhosis ([place])   . Diabetes mellitus without  complication ([place])    type 2 - last a1c was 5.2 per patient, no meds  . Dyspnea    when I have too much fluid  . Esophageal varices ([place])   . GERD (gastroesophageal reflux disease)   . Heart murmur   . Hepatic cirrhosis ([place])   . HLD (hyperlipidemia)    no meds  . Hypertension    no meds  . Iron deficiency anemia 09/26/2018     Past Surgical History:  Procedure Laterality Date  . BIOPSY  08/20/2019   Procedure: BIOPSY;  Surgeon: Jackquline Denmark, MD;  Location: [place];  Service: Gastroenterology;;  . CARPAL TUNNEL RELEASE Right   . CESAREAN SECTION    . CHOLECYSTECTOMY    . COLONOSCOPY  08/2019  . ESOPHAGEAL BANDING N/A 09/19/2019   Procedure: ESOPHAGEAL BANDING;  Surgeon: Yetta Flock, MD;  Location: [facility];  Service: Gastroenterology;  Laterality: N/A;  . ESOPHAGOGASTRODUODENOSCOPY (EGD) WITH PROPOFOL N/A 08/20/2019   Procedure: ESOPHAGOGASTRODUODENOSCOPY (EGD) WITH PROPOFOL;  Surgeon: Jackquline Denmark, MD;  Location: [place];  Service: Gastroenterology;  Laterality: N/A;  . ESOPHAGOGASTRODUODENOSCOPY (EGD) WITH PROPOFOL N/A 09/19/2019   Procedure: ESOPHAGOGASTRODUODENOSCOPY (EGD) WITH PROPOFOL;  Surgeon: Yetta Flock, MD;  Location: [facility];  Service: Gastroenterology;  Laterality: N/A;  . ESOPHAGOGASTRODUODENOSCOPY (EGD) WITH PROPOFOL N/A 01/16/2020   Procedure: ESOPHAGOGASTRODUODENOSCOPY (EGD) WITH PROPOFOL;  Surgeon: Jerene Bears, MD;  Location: [facility];  Service: Endoscopy;  Laterality: N/A;  . EYE SURGERY Bilateral    removed cataracts  . GASTRIC VARICES BANDING N/A 08/20/2019   Procedure: GASTRIC VARICES BANDING;  Surgeon: Jackquline Denmark, MD;  Location: [place];  Service: Gastroenterology;  Laterality: N/A;  . HOT HEMOSTASIS N/A 01/16/2020   Procedure: HOT HEMOSTASIS (ARGON PLASMA COAGULATION/BICAP);  Surgeon: Jerene Bears, MD;  Location: [facility];  Service: Endoscopy;  Laterality: N/A;  . IR PARACENTESIS  08/01/2019  . IR  PARACENTESIS  08/19/2019  . IR PARACENTESIS  09/09/2019  . IR PARACENTESIS  09/23/2019  . IR PARACENTESIS  10/08/2019  . IR PARACENTESIS  10/23/2019  . IR PARACENTESIS  11/12/2019  . IR PARACENTESIS  11/21/2019  . IR PARACENTESIS  11/25/2019  . IR PARACENTESIS  12/05/2019  . IR PARACENTESIS  12/13/2019  . IR PARACENTESIS  12/20/2019  . IR PARACENTESIS  12/26/2019  . IR PARACENTESIS  12/31/2019  . IR PARACENTESIS  01/07/2020  . IR PARACENTESIS  01/10/2020  . IR PARACENTESIS  01/13/2020  . IR PARACENTESIS  01/14/2020  . IR PARACENTESIS  01/20/2020  . IR PARACENTESIS  01/30/2020  . IR PARACENTESIS  02/24/2020  . IR RADIOLOGIST EVAL & MGMT  10/30/2019  . IR RADIOLOGIST EVAL & MGMT  12/11/2019  . IR RADIOLOGIST EVAL & MGMT  12/24/2019  . IR RADIOLOGIST EVAL & MGMT  03/05/2020  . IR TIPS  11/25/2019  . IR TIPS  01/14/2020  . KNEE ARTHROSCOPY Right 12/10/2015   Procedure: RIGHT KNEE ARTHROSCOPY WITH PARTIAL MEDIAL MENISCECTOMY;  Surgeon: Mcarthur Rossetti, MD;  Location: [facility];  Service: Orthopedics;  Laterality: Right;  . KNEE ARTHROSCOPY W/ MENISCAL REPAIR Bilateral   . MENISCUS REPAIR Left   . RADIOLOGY WITH ANESTHESIA N/A 11/25/2019   Procedure: TIPS;  Surgeon: Corrie Mckusick, DO;  Location: [place];  Service: Anesthesiology;  Laterality: N/A;  . RADIOLOGY WITH ANESTHESIA N/A 01/14/2020   Procedure: TIPS;  Surgeon: Corrie Mckusick, DO;  Location: [place];  Service: Anesthesiology;  Laterality: N/A;   Family History  Problem Relation Age of Onset  . Hypertension Mother   . COPD Mother   . Alcoholism Father   . Heart attack Father   . Diabetes Brother   . Diabetes Paternal Grandmother   . Heart disease Paternal Grandmother   . Alcoholism Paternal Uncle   . Alcoholism Maternal Uncle   . Heart disease Maternal Grandmother   . Heart disease Maternal Grandfather   . Heart disease Paternal Grandfather    Social History   Tobacco Use  . Smoking status: Never Smoker  . Smokeless tobacco: Never Used   Vaping Use  . Vaping Use: Never used  Substance Use Topics  . Alcohol use: No  . Drug use: No   Current Outpatient Medications  Medication Sig Dispense Refill  . ACCU-CHEK FASTCLIX LANCETS MISC Check fsbs TID    . AMBULATORY NON FORMULARY MEDICATION Wedge pillow 1 each 0  . aMILoride (MIDAMOR) 5 MG tablet Take 4 tablets in the morning and 2 tablets in the evening 180 tablet 2  . cetirizine (ZYRTEC) 10 MG tablet Take 1 tablet (10 mg total) by mouth daily. (Patient taking differently: Take 10 mg by mouth daily as needed for allergies.) 90 tablet 3  . Cholecalciferol (VITAMIN D3) 5000 UNITS TABS Take 5,000 Units by mouth daily.     . ferrous sulfate 325 (65 FE) MG EC tablet Take 1 tablet (325 mg total) by mouth 2 (two) times daily with a meal. 180 tablet 1  . furosemide (LASIX) 20 MG tablet TAKE 2 TABLETS BY MOUTH EVERY MORNING AND 1 TABLET EVERY NIGHT AT BEDTIME 90 tablet 1  . Krill Oil 500 MG CAPS Take 500 mg by mouth daily.    Marland Kitchen lactulose (CHRONULAC) 10 GM/15ML solution Take 15 mLs (10 g total) by mouth daily. Please keep your appt with Dr. Havery Moros on Thursday, 04-30-20. 473 mL 0  . Magnesium Oxide 400 (240 Mg) MG TABS TAKE 1 TABLET (400 MG TOTAL) BY MOUTH 2 (TWO) TIMES DAILY. 120 tablet 1  . pantoprazole (PROTONIX) 40 MG tablet Take 1 tablet (40 mg total) by mouth daily. 90 tablet 3  . potassium chloride SA (KLOR-CON) 20 MEQ tablet Take 20 mEq by mouth 2 (two) times daily.     Current Facility-Administered Medications  Medication Dose Route Frequency Provider Last Rate Last Admin  . albumin human 25 % solution 25 g  25 g Intravenous Once Levin Erp, [place]       Allergies  Allergen Reactions  . Penicillins Anaphylaxis    Happened as a young child  . Atrovent [Ipratropium] Palpitations    As per patient  . Naproxen Rash  . Quinine Derivatives Rash  . Sulfa Antibiotics Rash     Review of Systems: All systems reviewed and negative except where noted in HPI.    DG  Chest 2 View  Result Date: 04/10/2020 CLINICAL DATA:  cough EXAM: CHEST - 2 VIEW COMPARISON:  01/21/2020 FINDINGS: Patchy left basilar opacities and small pleural effusion. No pneumothorax. Cardiomediastinal silhouette within normal limits. Multilevel spondylosis. IMPRESSION: Left basilar opacities and small effusion. These results will be called to the ordering clinician or representative by the Radiologist Assistant, and communication documented in the PACS or [employer]. Electronically Signed   By: Primitivo Gauze M.D.   On: 04/10/2020 11:00    Physical Exam: BP (!) 110/50   Pulse 72   Ht 4' 10"  (1.473 m)   Wt 182 lb (82.6 kg)   LMP 12/03/2013 (LMP Unknown)   BMI 38.04 kg/m  Constitutional: Pleasant,well-developed, female in no acute distress. HEENT: Normocephalic and atraumatic. Conjunctivae are normal. No scleral icterus. Neck supple.  Cardiovascular: Normal rate, regular rhythm.  Pulmonary/chest: Effort normal and breath sounds normal. No wheezing, rales or rhonchi. Abdominal: Soft, distended with ascites, There are no masses palpable. Extremities: (+) 1-2 edema LE Lymphadenopathy: No cervical adenopathy noted. Neurological: Alert and oriented to person place and time. Skin: Skin is warm and dry. No rashes noted. Psychiatric: Normal mood and affect. Behavior is normal.   ASSESSMENT AND PLAN: 59 year old female here for reassessment the following:  Cirrhosis with refractory ascites Anasarca Esophageal varices Hepatic encephalopathy Abnormal chest x-ray  Decompensated cirrhosis with refractory ascites, esophageal varices.  Intolerant to beta blockade, intolerance to higher doses of diuretics in recent months.  She went for a DIPS per IR after traditional TIPS failed due to anatomic variation.  Echocardiogram looks okay.  She did not have a dramatic response for her fluid following DIPS but we have been able to escalate her diuretics and she has tolerated it much  better.  Currently on Lasix to 40 mg twice daily and amiloride 20 mg every morning and 10 mg every afternoon and volume status much improved.  Unfortunately over the past week she ran out of amiloride and retaining some fluid, I counseled her that we must stay on top of this and make sure that her medications are refilled in advance so she does not run out.  Esophageal varices have been banded and after DIPS looks much better on most recent EGD.  She does have a history of SBP, currently on Cipro prophylaxis given her allergy to Bactrim.  She is maintaining a low-sodium diet and compliant with her medications otherwise.  She does want a referral to see a hepatologist for transplant evaluation, her insurance does not approve this and we are [facility], if not can see [facility], [place], or [place].  We will need to keep a close eye on her moving forward, I like to have her follow-up with me in 2 months or so for reassessment.  She is due for basic labs today to recheck her kidney function will do that.  She also asked me to order the labs at her primary care for her as well.  She is due for follow-up Twinrix vaccination to complete the series she is also due for Covid booster which she plans on getting in the next week or 2.  We will also schedule follow-up CT scan of her chest to clarify findings on her last x-ray.  Further recommendations pending these results, I will see her in a few months or sooner with any issues.  She agreed  Plan: - Labs today - CBC, CMET, AFP, TSH, lipids - continue Amiloride 74m q AM, 176mq PM and lasix 403mID, KDur 13m42mID -  continue cipro 562m / day for SBP prophylaxis - low Na diet - continue lactulose - follow up with Hepatology referral, she will touch base with her insurance to determine where they would like her seen - Twinrix final vaccination today - recommend COVID booster - CT scan chest to evaluate abnormal CXR - follow up with me in  office in 2 months  Patient in agreement. All questions answered.  SCarolina Cellar MD [facility]

## 2020-04-30 NOTE — Patient Instructions (Addendum)
If you are age 57 or older, your body mass index should be between 23-30. Your Body mass index is 38.04 kg/m. If this is out of the aforementioned range listed, please consider follow up with your Primary Care Provider.  If you are age 57 or younger, your body mass index should be between 19-25. Your Body mass index is 38.04 kg/m. If this is out of the aformentioned range listed, please consider follow up with your Primary Care Provider.   Please go to the lab in the basement of our building to have lab work done as you leave today. Hit "B" for basement when you get on the elevator.  When the doors open the lab is on your left.  We will call you with the results. Thank you.  Due to recent changes in healthcare laws, you may see the results of your imaging and laboratory studies on MyChart before your provider has had a chance to review them.  We understand that in some cases there may be results that are confusing or concerning to you. Not all laboratory results come back in the same time frame and the provider may be waiting for multiple results in order to interpret others.  Please give [place] 48 hours in order for your provider to thoroughly review all the results before contacting the office for clarification of your results.   We have given you your 3rd and final Twinrix vaccine to protect you from Hepatitis A and Hepatitis B. You have been given a Vaccine Information Sheet.   You have been scheduled for a CT of chest at [facility] located at  [address]. Please arrive at 10:30am.  Continue your present medications and a low sodium diet.  You have been scheduled for a follow up appointment on February 24th at 9:20am.  Thank you for entrusting me with your care and for choosing [facility], Dr. Littlefield Cellar

## 2020-05-04 ENCOUNTER — Other Ambulatory Visit: Payer: Self-pay

## 2020-05-04 DIAGNOSIS — R188 Other ascites: Secondary | ICD-10-CM

## 2020-05-04 DIAGNOSIS — K7460 Unspecified cirrhosis of liver: Secondary | ICD-10-CM

## 2020-05-04 LAB — AFP TUMOR MARKER: AFP-Tumor Marker: 2.7 ng/mL

## 2020-05-06 ENCOUNTER — Ambulatory Visit (INDEPENDENT_AMBULATORY_CARE_PROVIDER_SITE_OTHER)
Admission: RE | Admit: 2020-05-06 | Discharge: 2020-05-06 | Disposition: A | Discharge status: Home / Self Care | Payer: No Typology Code available for payment source | Source: Ambulatory Visit | Attending: Gastroenterology | Admitting: Gastroenterology

## 2020-05-06 ENCOUNTER — Other Ambulatory Visit: Payer: Self-pay

## 2020-05-06 DIAGNOSIS — K7581 Nonalcoholic steatohepatitis (NASH): Secondary | ICD-10-CM | POA: Diagnosis not present

## 2020-05-06 DIAGNOSIS — R059 Cough, unspecified: Secondary | ICD-10-CM

## 2020-05-06 DIAGNOSIS — R601 Generalized edema: Secondary | ICD-10-CM

## 2020-05-06 DIAGNOSIS — R188 Other ascites: Secondary | ICD-10-CM

## 2020-05-06 DIAGNOSIS — K7460 Unspecified cirrhosis of liver: Secondary | ICD-10-CM

## 2020-05-06 MED ORDER — IOHEXOL 300 MG/ML  SOLN
80.0000 mL | Freq: Once | INTRAMUSCULAR | Status: AC | PRN
  Administered 2020-05-06: 80 mL via INTRAVENOUS

## 2020-05-07 ENCOUNTER — Other Ambulatory Visit: Payer: Self-pay

## 2020-05-07 DIAGNOSIS — R601 Generalized edema: Secondary | ICD-10-CM

## 2020-05-07 DIAGNOSIS — R188 Other ascites: Secondary | ICD-10-CM

## 2020-05-07 DIAGNOSIS — K7460 Unspecified cirrhosis of liver: Secondary | ICD-10-CM

## 2020-05-11 ENCOUNTER — Other Ambulatory Visit: Payer: Self-pay | Admitting: Interventional Radiology

## 2020-05-11 DIAGNOSIS — K7460 Unspecified cirrhosis of liver: Secondary | ICD-10-CM

## 2020-05-11 DIAGNOSIS — R188 Other ascites: Secondary | ICD-10-CM

## 2020-05-13 ENCOUNTER — Telehealth: Payer: Self-pay

## 2020-05-13 ENCOUNTER — Other Ambulatory Visit (INDEPENDENT_AMBULATORY_CARE_PROVIDER_SITE_OTHER): Payer: No Typology Code available for payment source

## 2020-05-13 DIAGNOSIS — R601 Generalized edema: Secondary | ICD-10-CM

## 2020-05-13 DIAGNOSIS — K7460 Unspecified cirrhosis of liver: Secondary | ICD-10-CM | POA: Diagnosis not present

## 2020-05-13 DIAGNOSIS — R188 Other ascites: Secondary | ICD-10-CM

## 2020-05-13 LAB — BASIC METABOLIC PANEL
BUN: 11 mg/dL (ref 6–23)
CO2: 24 mEq/L (ref 19–32)
Calcium: 8.4 mg/dL (ref 8.4–10.5)
Chloride: 106 mEq/L (ref 96–112)
Creatinine, Ser: 0.97 mg/dL (ref 0.40–1.20)
GFR: 64.9 mL/min (ref >60.00)
Glucose, Bld: 206 mg/dL — ABNORMAL HIGH (ref 70–99)
Potassium: 4 mEq/L (ref 3.5–5.1)
Sodium: 135 mEq/L (ref 135–145)

## 2020-05-13 NOTE — Telephone Encounter (Signed)
-----   Message from Yevette Edwards, RN sent at 05/07/2020 11:33 AM EST ----- Regarding: Labs Repeat BMET, order in epic.

## 2020-05-13 NOTE — Telephone Encounter (Signed)
Spoke with patient to remind her that she is due for repeat labs at his time, patient states that she had it on her calendar to come in today. Advised that we will call her with the results. Patient had no concerns at the end of the call.

## 2020-05-14 ENCOUNTER — Other Ambulatory Visit: Payer: Self-pay

## 2020-05-14 DIAGNOSIS — K7460 Unspecified cirrhosis of liver: Secondary | ICD-10-CM

## 2020-05-14 DIAGNOSIS — R188 Other ascites: Secondary | ICD-10-CM

## 2020-05-15 ENCOUNTER — Encounter: Payer: Self-pay | Admitting: *Deleted

## 2020-05-15 ENCOUNTER — Other Ambulatory Visit: Payer: Self-pay

## 2020-05-15 ENCOUNTER — Ambulatory Visit
Admission: RE | Admit: 2020-05-15 | Discharge: 2020-05-15 | Disposition: A | Discharge status: Home / Self Care | Payer: No Typology Code available for payment source | Source: Ambulatory Visit | Attending: Interventional Radiology | Admitting: Interventional Radiology

## 2020-05-15 ENCOUNTER — Other Ambulatory Visit: Payer: Self-pay | Admitting: Gastroenterology

## 2020-05-15 ENCOUNTER — Other Ambulatory Visit: Payer: Self-pay | Admitting: Physician Assistant

## 2020-05-15 DIAGNOSIS — R188 Other ascites: Secondary | ICD-10-CM

## 2020-05-15 DIAGNOSIS — K7460 Unspecified cirrhosis of liver: Secondary | ICD-10-CM

## 2020-05-15 HISTORY — PX: IR RADIOLOGIST EVAL & MGMT: IMG5224

## 2020-05-15 MED FILL — PANTOPRAZOLE SOD DR 40 MG T: 40 | 90 days supply | Qty: 90 | Fill #1

## 2020-05-15 MED FILL — MAGNESIUM OXIDE 400 MG TAB: 400 (240 MG | 60 days supply | Qty: 120 | Fill #1

## 2020-05-15 NOTE — Telephone Encounter (Signed)
Should this be refilled?

## 2020-05-15 NOTE — Telephone Encounter (Signed)
Yes please refill it, she is on it chronically for SBP prophylaxis. Thank you

## 2020-05-15 NOTE — Progress Notes (Signed)
Chief Complaint: Follow up for refractory ascites, NASH cirrhosis, SP TIPS shunt   Referring Physician(s): Dr. Havery Moros PCP: Dr. [name]  History of Present Illness: Meagan Peck is a 60 y.o. female presenting as a scheduled follow up to the clinic SP treatment for refractory ascites with DIPS.   She joins [place] today virtually on the telephone.  We confirmed her identity with 2 personal identifiers.   HPI: Meagan Peck was treated as in inpatient at [facility] with direct trans-caval intra-hepatic portal-systemic shunt (DIPS, analagous to TIPS).  This was performed 01/14/2020, after a traditional TIPS was attempted and failed given her anatomy.    This was performed given her history of NASH cirrhosis contributing to portal htn, refractory ascites, & limited medical options, as she had developing hepato-renal dysfunction and low sodium.    Before our DIPS was performed she had serial large volume paracentesis.  The range of volume removed on LVP after 08/01/19 was 5L - 13L.   Most performed were in the 7 - 10 L range.  After DIPS, para was performed with volumes of 3.6L, 3.2L, and 4.0L.    Interval History: Today Meagan Peck fills me in on her recent events, which includes work up for a worsening cough that had been present since inpt in September.  She underwent testing for infection (negative) and imagine with CXR and CT, revealing edema, pleural effusion, and ascites.  She tells me that the treatment strategy was to increase her diuretics, on which she continues now.  Once the diuretics were increased, she tells me she started to lose weight (over 20 pounds), increase urination, and ultimately start feeling better.   She tells me that she feels much better having started to diurese.  She tells me the cough is much better. She has not had a recent paracentesis or thoracentesis, with the last LVP performed 02/24/20.   Her most recent labs, 05/13/20: Potassium = 4.0 BUN =  11 Cr = 0.97 Ca = 8.4  She tells me she takes her lactulose every morning, and has not had any problems with hepatic encephalopathy.  She continues to work.  .   Past Medical History:  Diagnosis Date  . Acute medial meniscus tear of right knee 12/10/2015  . Allergy   . Anasarca   . Arthritis    bil feet  . Ascites   . Cataract    bilateral - surgery to remove  . Cirrhosis ([place])   . Diabetes mellitus without complication ([place])    type 2 - last a1c was 5.2 per patient, no meds  . Dyspnea    when I have too much fluid  . Esophageal varices ([place])   . GERD (gastroesophageal reflux disease)   . Heart murmur   . Hepatic cirrhosis ([place])   . HLD (hyperlipidemia)    no meds  . Hypertension    no meds  . Iron deficiency anemia 09/26/2018    Past Surgical History:  Procedure Laterality Date  . BIOPSY  08/20/2019   Procedure: BIOPSY;  Surgeon: Jackquline Denmark, MD;  Location: [place];  Service: Gastroenterology;;  . CARPAL TUNNEL RELEASE Right   . CESAREAN SECTION    . CHOLECYSTECTOMY    . COLONOSCOPY  08/2019  . ESOPHAGEAL BANDING N/A 09/19/2019   Procedure: ESOPHAGEAL BANDING;  Surgeon: Yetta Flock, MD;  Location: [facility];  Service: Gastroenterology;  Laterality: N/A;  . ESOPHAGOGASTRODUODENOSCOPY (EGD) WITH PROPOFOL N/A 08/20/2019   Procedure: ESOPHAGOGASTRODUODENOSCOPY (EGD) WITH PROPOFOL;  Surgeon: Jackquline Denmark, MD;  Location: [facility] END[facility]COPY;  Service: Gastroenterology;  Laterality: N/A;  . ESOPHAGOGASTRODUODENOSCOPY (EGD) WITH PROPOFOL N/A 09/19/2019   Procedure: ESOPHAGOGASTRODUODENOSCOPY (EGD) WITH PROPOFOL;  Surgeon: Yetta Flock, MD;  Location: [facility];  Service: Gastroenterology;  Laterality: N/A;  . ESOPHAGOGASTRODUODENOSCOPY (EGD) WITH PROPOFOL N/A 01/16/2020   Procedure: ESOPHAGOGASTRODUODENOSCOPY (EGD) WITH PROPOFOL;  Surgeon: Jerene Bears, MD;  Location: [place] ENDOSCOPY;  Service: Endoscopy;  Laterality: N/A;  . EYE SURGERY Bilateral    removed  cataracts  . GASTRIC VARICES BANDING N/A 08/20/2019   Procedure: GASTRIC VARICES BANDING;  Surgeon: Jackquline Denmark, MD;  Location: [place];  Service: Gastroenterology;  Laterality: N/A;  . HOT HEMOSTASIS N/A 01/16/2020   Procedure: HOT HEMOSTASIS (ARGON PLASMA COAGULATION/BICAP);  Surgeon: Jerene Bears, MD;  Location[facility] ENDOSCOPY;  Service: Endoscopy;  Laterality: N/A;  . IR PARACENTESIS  08/01/2019  . IR PARACENTESIS  08/19/2019  . IR PARACENTESIS  09/09/2019  . IR PARACENTESIS  09/23/2019  . IR PARACENTESIS  10/08/2019  . IR PARACENTESIS  10/23/2019  . IR PARACENTESIS  11/12/2019  . IR PARACENTESIS  11/21/2019  . IR PARACENTESIS  11/25/2019  . IR PARACENTESIS  12/05/2019  . IR PARACENTESIS  12/13/2019  . IR PARACENTESIS  12/20/2019  . IR PARACENTESIS  12/26/2019  . IR PARACENTESIS  12/31/2019  . IR PARACENTESIS  01/07/2020  . IR PARACENTESIS  01/10/2020  . IR PARACENTESIS  01/13/2020  . IR PARACENTESIS  01/14/2020  . IR PARACENTESIS  01/20/2020  . IR PARACENTESIS  01/30/2020  . IR PARACENTESIS  02/24/2020  . IR RADIOLOGIST EVAL & MGMT  10/30/2019  . IR RADIOLOGIST EVAL & MGMT  12/11/2019  . IR RADIOLOGIST EVAL & MGMT  12/24/2019  . IR RADIOLOGIST EVAL & MGMT  03/05/2020  . IR TIPS  11/25/2019  . IR TIPS  01/14/2020  . KNEE ARTHROSCOPY Right 12/10/2015   Procedure: RIGHT KNEE ARTHROSCOPY WITH PARTIAL MEDIAL MENISCECTOMY;  Surgeon: Mcarthur Rossetti, MD;  Location: [facility];  Service: Orthopedics;  Laterality: Right;  . KNEE ARTHROSCOPY W/ MENISCAL REPAIR Bilateral   . MENISCUS REPAIR Left   . RADIOLOGY WITH ANESTHESIA N/A 11/25/2019   Procedure: TIPS;  Surgeon: Corrie Mckusick, DO;  Location: [place];  Service: Anesthesiology;  Laterality: N/A;  . RADIOLOGY WITH ANESTHESIA N/A 01/14/2020   Procedure: TIPS;  Surgeon: Corrie Mckusick, DO;  Location: [place];  Service: Anesthesiology;  Laterality: N/A;    Allergies: Penicillins, Atrovent [ipratropium], Naproxen, Quinine derivatives, and Sulfa  antibiotics  Medications: Prior to Admission medications   Medication Sig Start Date End Date Taking? Authorizing Provider  ACCU-CHEK FASTCLIX LANCETS MISC Check fsbs TID 08/09/16   [provider]  AMBULATORY NON FORMULARY MEDICATION Wedge pillow 01/27/20   Early, Coralee Pesa, NP  aMILoride (MIDAMOR) 5 MG tablet Take 4 tablets in the morning and 2 tablets in the evening 04/09/20   Levin Erp, PA  cetirizine (ZYRTEC) 10 MG tablet Take 1 tablet (10 mg total) by mouth daily. Patient taking differently: Take 10 mg by mouth daily as needed for allergies. 01/02/19   [name], DO  Cholecalciferol (VITAMIN D3) 5000 UNITS TABS Take 5,000 Units by mouth daily.     [provider]  ciprofloxacin (CIPRO) 500 MG tablet Take 500 mg by mouth daily with breakfast.    [provider]  ferrous sulfate 325 (65 FE) MG EC tablet Take 1 tablet (325 mg total) by mouth 2 (two) times daily with a meal.  09/26/18   [name], DO  furosemide (LASIX) 20 MG tablet TAKE 2 TABLETS BY MOUTH EVERY MORNING AND 1 TABLET EVERY NIGHT AT BEDTIME 04/28/20   Armbruster, Carlota Raspberry, MD  Krill Oil 500 MG CAPS Take 500 mg by mouth daily.    [provider]  lactulose (CHRONULAC) 10 GM/15ML solution Take 15 mLs (10 g total) by mouth daily. Please keep your appt with Dr. Havery Moros on Thursday, 04-30-20. 04/28/20   Yetta Flock, MD  Magnesium Oxide 400 (240 Mg) MG TABS TAKE 1 TABLET (400 MG TOTAL) BY MOUTH 2 (TWO) TIMES DAILY. 03/10/20   [name], DO  pantoprazole (PROTONIX) 40 MG tablet Take 1 tablet (40 mg total) by mouth daily. 02/17/20   Armbruster, Carlota Raspberry, MD  potassium chloride SA (KLOR-CON) 20 MEQ tablet Take 20 mEq by mouth 2 (two) times daily.    [provider]     Family History  Problem Relation Age of Onset  . Hypertension Mother   . COPD Mother   . Alcoholism Father   . Heart attack Father   . Diabetes Brother   . Diabetes Paternal  Grandmother   . Heart disease Paternal Grandmother   . Alcoholism Paternal Uncle   . Alcoholism Maternal Uncle   . Heart disease Maternal Grandmother   . Heart disease Maternal Grandfather   . Heart disease Paternal Grandfather     Social History   Socioeconomic History  . Marital status: Married    Spouse name: Eniola Cerullo  . Number of children: 1  . Years of education: Not on file  . Highest education level: Associate degree: academic program  Occupational History  . Not on file  Tobacco Use  . Smoking status: Never Smoker  . Smokeless tobacco: Never Used  Vaping Use  . Vaping Use: Never used  Substance and Sexual Activity  . Alcohol use: No  . Drug use: No  . Sexual activity: Yes    Partners: Male    Birth control/protection: None, Post-menopausal  Other Topics Concern  . Not on file  Social History Narrative  . Not on file   Social Determinants of Health   Financial Resource Strain: Not on file  Food Insecurity: Not on file  Transportation Needs: Not on file  Physical Activity: Not on file  Stress: Not on file  Social Connections: Not on file       Review of Systems  Review of Systems: A 12 point ROS discussed and pertinent positives are indicated in the HPI above.  All other systems are negative.  Physical Exam No direct physical exam was performed (except for noted visual exam findings with Video Visits).    Vital Signs: LMP 12/03/2013 (LMP Unknown)   Imaging: CT CHEST W CONTRAST  Result Date: 05/06/2020 CLINICAL DATA:  Persistent cough for 2 weeks following hospitalization for pneumonia. History of non alcoholic steatohepatitis and TIPS procedure. History of recurrent paracentesis, most recently 02/24/2020. EXAM: CT CHEST WITH CONTRAST TECHNIQUE: Multidetector CT imaging of the chest was performed during intravenous contrast administration. CONTRAST:  59m OMNIPAQUE IOHEXOL 300 MG/ML  SOLN COMPARISON:  Radiographs 04/09/2020.  CT abdomen pelvis  01/21/2020. FINDINGS: Cardiovascular: Prominent three-vessel coronary artery atherosclerosis with lesser involvement of the aorta. No acute vascular findings. No evidence of acute pulmonary embolism on nondedicated imaging. There are prominent mitral annular calcifications and a small pericardial effusion. The heart size is normal. Mediastinum/Nodes: There are no enlarged mediastinal, hilar or axillary lymph nodes. The thyroid gland,  trachea and esophagus demonstrate no significant findings. Lungs/Pleura: Moderate-sized dependent bilateral pleural effusions are again noted, similar in volume to previous abdominal CT. Associated compressive atelectasis at both lung bases. There is interval improved aeration of the right lung base. Lingular opacification has mildly worsened. There are no airspace opacities within the aerated portions of the lungs to suggest pneumonia. No suspicious pulmonary nodules. Upper abdomen: Direct intrahepatic portosystemic shunt is unchanged in position and patent. There is a large amount of ascites which has increased in volume compared with the prior CT. There is generalized edema throughout the chest and abdominal wall which has worsened. Underlying morphologic changes of cirrhosis are again noted with stable prominent lymph nodes in the porta hepatis, likely reactive. Stable mild splenomegaly. No large diaphragmatic defect identified. Musculoskeletal/Chest wall: There is no chest wall mass or suspicious osseous finding. Generalized soft tissue edema consistent with anasarca. Degenerative changes in the spine with scattered hemangiomas, largest at T8. IMPRESSION: 1. Persistent moderate-sized dependent bilateral pleural effusions with compressive atelectasis at both lung bases. Improved aeration of the right lung base and worsened aeration of the lingula. No evidence of pneumonia. 2. Anasarca with generalized soft tissue edema throughout the chest and abdominal wall. 3. Large amount of  ascites, increased in volume compared with prior CT. Stable appearance of the portosystemic shunt. 4. Aortic Atherosclerosis (ICD10-I70.0). Electronically Signed   By: Richardean Sale M.D.   On: 05/06/2020 12:08    Labs:  CBC: Recent Labs    01/21/20 0058 01/22/20 0429 01/27/20 1513 04/30/20 0937  WBC 7.5 6.1 4.7 3.7*  HGB 9.8* 9.0* 9.5* 9.1*  HCT 28.6* 27.1* 27.4* 25.6 Repeated and verified X2.*  PLT 110* 104* 92* 110.0*    COAGS: Recent Labs    11/25/19 0630 12/20/19 0624 01/10/20 0434 01/10/20 0717 01/14/20 0431 01/27/20 1513  INR 1.5*   < > 1.6* 1.4* 1.6* 1.2*  APTT 35  --   --   --   --   --    < > = values in this interval not displayed.    BMP: Recent Labs    01/20/20 0052 01/21/20 0058 01/22/20 0429 01/27/20 1513 01/30/20 1255 04/06/20 1614 04/17/20 1306 04/30/20 0937 05/13/20 1519  NA 133* 130* 127* 137   < > 138 137 139 135  K 5.0 5.2* 5.4* 4.5   < > 3.1* 4.0 3.0* 4.0  CL 103 102 100 110   < > 106 109 108 106  CO2 24 21* 21* 22   < > 24 23 25 24   GLUCOSE 134* 128* 105* 88   < > 145* 188* 147* 206*  BUN 22* 17 15 20    < > 11 12 11 11   CALCIUM 8.5* 8.1* 8.0* 8.0*   < > 7.7* 7.8* 7.6* 8.4  CREATININE 0.96 0.91 0.91 0.95   < > 0.89 0.93 0.87 0.97  GFRNONAA >60 >60 >60 66  --   --   --   --   --   GFRAA >60 >60 >60 77  --   --   --   --   --    < > = values in this interval not displayed.    LIVER FUNCTION TESTS: Recent Labs    01/20/20 0052 01/21/20 0058 01/22/20 0429 04/30/20 0937  BILITOT 2.2* 2.5* 2.7* 3.6*  AST 52* 43* 48* 32  ALT 12 10 9 13   ALKPHOS 118 107 108 97  PROT 4.9* 4.7* 4.6* 5.3*  ALBUMIN 2.6* 2.5* 2.4* 2.3*  TUMOR MARKERS: Recent Labs    07/31/19 1154 04/30/20 0937  AFPTM 2.3 2.7       Assessment and Plan:  Meagan Witherow is 60 yo female with NASH cirrhosis, portal HTN and refractory ascites with hepato-renal dysfunction, SP direct intra-hepatic portal-systemic shunt placement (DIPS, analagous to TIPS) for  treatment.   She has recently had successful increase in her diuretics to treat her for edema/anasarca and positive fluid balance.  She is feeling much better.   I did give her the encouraging results of this, which is possible given the normalization of her renal function/labs.     Her last LVP was 02/24/20.   Her recent chest CT incidentally covered the DIPS, which was patent without any problems.    At this point, I have encouraged her to continue her current medical therapy.  We will schedule a 6 month follow up with TIPS duplex to assess the shunt.  She knows she can call us at any time should she have any problems.   Plan: - We will see her back in 6 months with TIPS duplex - Continue medical therapy - I have previously advised her to consider compression stockings to augment fluid/lymph return of legs.    Electronically Signed: Corrie Mckusick 05/15/2020, 9:21 AM   I spent a total of    40 Minutes in remote  clinical consultation, greater than 50% of which was counseling/coordinating care for NASH, portal HTN, prior TIPS.    Visit type: Audio only (telephone). Audio (no video) only due to patient's lack of internet/smartphone capability. Alternative for in-person consultation at [facility], [address], [place], [place]. This visit type was conducted due to national recommendations for restrictions regarding the COVID-19 Pandemic (e.g. social distancing).  This format is felt to be most appropriate for this patient at this time.  All issues noted in this document were discussed and addressed.

## 2020-05-18 MED FILL — CIPROFLOXACIN HCL 500 MG TA: 500 | 30 days supply | Qty: 30 | Fill #0

## 2020-05-25 MED FILL — FUROSEMIDE 20 MG TAB: 20 | 30 days supply | Qty: 90 | Fill #1

## 2020-05-25 MED FILL — aMILoride HCL 5 MG TABS: 5 | 30 days supply | Qty: 180 | Fill #1

## 2020-05-26 ENCOUNTER — Ambulatory Visit: Payer: No Typology Code available for payment source | Attending: Internal Medicine

## 2020-05-26 ENCOUNTER — Other Ambulatory Visit (HOSPITAL_BASED_OUTPATIENT_CLINIC_OR_DEPARTMENT_OTHER): Payer: Self-pay | Admitting: Internal Medicine

## 2020-05-26 ENCOUNTER — Other Ambulatory Visit (INDEPENDENT_AMBULATORY_CARE_PROVIDER_SITE_OTHER): Payer: No Typology Code available for payment source

## 2020-05-26 DIAGNOSIS — K7460 Unspecified cirrhosis of liver: Secondary | ICD-10-CM

## 2020-05-26 DIAGNOSIS — R188 Other ascites: Secondary | ICD-10-CM | POA: Diagnosis not present

## 2020-05-26 DIAGNOSIS — Z23 Encounter for immunization: Secondary | ICD-10-CM

## 2020-05-26 LAB — BASIC METABOLIC PANEL
BUN: 13 mg/dL (ref 6–23)
CO2: 23 mEq/L (ref 19–32)
Calcium: 8.8 mg/dL (ref 8.4–10.5)
Chloride: 106 mEq/L (ref 96–112)
Creatinine, Ser: 1.05 mg/dL (ref 0.40–1.20)
GFR: 59 mL/min — ABNORMAL LOW (ref >60.00)
Glucose, Bld: 242 mg/dL — ABNORMAL HIGH (ref 70–99)
Potassium: 3.9 mEq/L (ref 3.5–5.1)
Sodium: 134 mEq/L — ABNORMAL LOW (ref 135–145)

## 2020-05-26 MED FILL — PFIZER-BIONTECH COVID-19 VA: 30 | 21 days supply | Qty: 0 | Fill #0

## 2020-05-26 NOTE — Progress Notes (Signed)
   Covid-19 Vaccination Clinic  Name:  Meagan Peck    MRN: [id number] DOB: 1961-01-04  05/26/2020  Meagan Peck was observed post Covid-19 immunization for 15 minutes without incident. She was provided with Vaccine Information Sheet and instruction to access the V-Safe system.  Vaccinated by [facility]  Meagan Peck was instructed to call 911 with any severe reactions post vaccine: Marland Kitchen Difficulty breathing  . Swelling of face and throat  . A fast heartbeat  . A bad rash all over body  . Dizziness and weakness   Immunizations Administered    Name Date Dose VIS Date Route   Pfizer COVID-19 Vaccine 05/26/2020  2:22 PM 0.3 mL 02/19/2020 Intramuscular   Manufacturer: [place]   Lot: [id number]   NDC: [address]-4

## 2020-06-02 DIAGNOSIS — Z0279 Encounter for issue of other medical certificate: Secondary | ICD-10-CM

## 2020-06-11 ENCOUNTER — Other Ambulatory Visit: Payer: Self-pay

## 2020-06-11 DIAGNOSIS — D509 Iron deficiency anemia, unspecified: Secondary | ICD-10-CM

## 2020-06-11 DIAGNOSIS — R188 Other ascites: Secondary | ICD-10-CM

## 2020-06-11 DIAGNOSIS — K7460 Unspecified cirrhosis of liver: Secondary | ICD-10-CM

## 2020-06-11 DIAGNOSIS — I8500 Esophageal varices without bleeding: Secondary | ICD-10-CM

## 2020-06-11 NOTE — Progress Notes (Signed)
Patient will be due for BMET around 2-25. Patient has appointment on 2-24.

## 2020-06-12 ENCOUNTER — Ambulatory Visit (INDEPENDENT_AMBULATORY_CARE_PROVIDER_SITE_OTHER): Payer: No Typology Code available for payment source | Admitting: Osteopathic Medicine

## 2020-06-12 ENCOUNTER — Encounter: Payer: Self-pay | Admitting: Osteopathic Medicine

## 2020-06-12 ENCOUNTER — Other Ambulatory Visit: Payer: Self-pay

## 2020-06-12 VITALS — BP 122/71 | HR 80 | Temp 97.8°F | Wt 152.0 lb

## 2020-06-12 DIAGNOSIS — L8991 Pressure ulcer of unspecified site, stage 1: Secondary | ICD-10-CM | POA: Diagnosis not present

## 2020-06-12 MED ORDER — CLINDAMYCIN HCL 300 MG PO CAPS: 300.0000 mg | ORAL_CAPSULE | Freq: Three times a day (TID) | ORAL | 0 refills | Status: DC

## 2020-06-12 NOTE — Progress Notes (Signed)
Meagan Peck is a 60 y.o. female who presents to  [place] at [facility]  today, 06/12/20, seeking care for the following:  Noted sore on umbilicus, right small umbilical hernia likely due to significant ascites (history of [name] recently progressed this year to cirrhosis, following with GI/hepatology)   See photos below, ulceration is tender but there is no surrounding erythema or severe pain, no palpable abscess.         ASSESSMENT & PLAN with other pertinent findings:  The encounter diagnosis was Pressure injury, stage 1, unspecified location.   Ulceration most consistent with almost a pressure type ulcer due to location.  Area was cleaned and a Xeroform dressing was applied, over that placed Telfa pad and fabric tape.  [name] husband was present and agrees that he can maintain this regimen for wound care, I ideally dressing change every 12 hours or so.  They declined home health referral at this point but I think close follow-up early next week to see how the wound is looking, see me sooner if needed.  Does not appear to be consistent with cellulitis at this point, but patient was given printed antibiotic to cover for MRSA in case of erythema spread, worsening pain/worsening drainage, educated that there is some normal yellowish granulation tissue that I would expect to be present in this is not a cause for concern.  See me sooner if needed/if suspecting for infection  Patient Instructions  Wound care:  Iodoform dressing (yellow gooey gauze) directly on skin ulcer  Cover with Telfa pad  Cover that with fabric tape   Change dressing twice daily  If more redness/pain, let me know and start printed Rx for antibiotics!    No orders of the defined types were placed in this encounter.   Meds ordered this encounter  Medications  . clindamycin (CLEOCIN) 300 MG capsule    Sig: Take 1 capsule (300 mg total) by mouth 3 (three)  times daily.    Dispense:  21 capsule    Refill:  0     See below for relevant physical exam findings  See below for recent lab and imaging results reviewed  Medications, allergies, PMH, PSH, SocH, FamH reviewed below    Follow-up instructions: Return in about 1 week (around 06/19/2020) for WOUND RECHECK .                                        Exam:  BP 122/71 (BP Location: Left Arm, Patient Position: Sitting, Cuff Size: Normal)   Pulse 80   Temp 97.8 F (36.6 C) (Oral)   Wt 152 lb 0.6 oz (69 kg)   LMP 12/03/2013 (LMP Unknown)   BMI 31.78 kg/m   Constitutional: VS see above. General Appearance: alert, well-developed, well-nourished, NAD  Neck: No masses, trachea midline.   Respiratory: Normal respiratory effort.  Musculoskeletal: Gait normal. Symmetric and independent movement of all extremities  Abdominal: non-tender,(+)distended, no appreciable organomegaly, neg Murphy's, BS WNLx4  Neurological: Normal balance/coordination. No tremor.  Skin: see photos above, otherwise mild jaundice   Psychiatric: Normal judgment/insight. Normal mood and affect. Oriented x3.   Current Meds  Medication Sig  . ACCU-CHEK FASTCLIX LANCETS MISC Check fsbs TID  . AMBULATORY NON FORMULARY MEDICATION Wedge pillow  . aMILoride (MIDAMOR) 5 MG tablet Take 4 tablets in the morning and 2 tablets in the evening  .  cetirizine (ZYRTEC) 10 MG tablet Take 1 tablet (10 mg total) by mouth daily. (Patient taking differently: Take 10 mg by mouth daily as needed for allergies.)  . Cholecalciferol (VITAMIN D3) 5000 UNITS TABS Take 5,000 Units by mouth daily.   . ciprofloxacin (CIPRO) 500 MG tablet TAKE 1 TABLET (500 MG TOTAL) BY MOUTH DAILY.  . clindamycin (CLEOCIN) 300 MG capsule Take 1 capsule (300 mg total) by mouth 3 (three) times daily.  . ferrous sulfate 325 (65 FE) MG EC tablet Take 1 tablet (325 mg total) by mouth 2 (two) times daily with a meal.  . furosemide  (LASIX) 20 MG tablet TAKE 2 TABLETS BY MOUTH EVERY MORNING AND 1 TABLET EVERY NIGHT AT BEDTIME  . Krill Oil 500 MG CAPS Take 500 mg by mouth daily.  Marland Kitchen lactulose (CHRONULAC) 10 GM/15ML solution Take 15 mLs (10 g total) by mouth daily. Please keep your appt with Dr. Havery Moros on Thursday, 04-30-20.  . Magnesium Oxide 400 (240 Mg) MG TABS TAKE 1 TABLET (400 MG TOTAL) BY MOUTH 2 (TWO) TIMES DAILY.  . pantoprazole (PROTONIX) 40 MG tablet Take 1 tablet (40 mg total) by mouth daily.  . potassium chloride SA (KLOR-CON) 20 MEQ tablet Take 20 mEq by mouth 2 (two) times daily.   Current Facility-Administered Medications for the 06/12/20 encounter (Office Visit) with [name], DO  Medication  . albumin human 25 % solution 25 g    Allergies  Allergen Reactions  . Penicillins Anaphylaxis    Happened as a young child  . Atrovent [Ipratropium] Palpitations    As per patient  . Naproxen Rash  . Quinine Derivatives Rash  . Sulfa Antibiotics Rash    Patient Active Problem List   Diagnosis Date Noted  . Liver cirrhosis secondary to NASH ([place]) 01/27/2020  . Abnormal weight gain 01/27/2020  . Anasarca 01/27/2020  . Electrolyte disturbance 01/27/2020  . Hospital discharge follow-up 01/27/2020  . NASH (nonalcoholic steatohepatitis)   . Portal hypertension ([place])   . Angiodysplasia of upper gastrointestinal tract   . SBP (spontaneous bacterial peritonitis) ([place]) 01/08/2020  . Esophageal varices without bleeding ([place])   . Hypertension associated with diabetes ([place]) 08/16/2019  . Vitamin D deficiency 05/15/2017  . Iron deficiency 05/15/2017  . Breast screening declined 05/15/2017  . Colonoscopy refused 05/15/2017  . Choroidal nevus of both eyes 07/18/2016  . Corneal epithelial and basement membrane dystrophy 07/18/2016  . Thrombocytopenia ([place]) 04/14/2016  . Hyperlipidemia associated with type 2 diabetes mellitus ([place]) 05/25/2015  . Gastroesophageal reflux disease 05/25/2015  . Type 2  diabetes mellitus with other specified complication ([place]) 16/01/9603    Family History  Problem Relation Age of Onset  . Hypertension Mother   . COPD Mother   . Alcoholism Father   . Heart attack Father   . Diabetes Brother   . Diabetes Paternal Grandmother   . Heart disease Paternal Grandmother   . Alcoholism Paternal Uncle   . Alcoholism Maternal Uncle   . Heart disease Maternal Grandmother   . Heart disease Maternal Grandfather   . Heart disease Paternal Grandfather     Social History   Tobacco Use  Smoking Status Never Smoker  Smokeless Tobacco Never Used    Past Surgical History:  Procedure Laterality Date  . BIOPSY  08/20/2019   Procedure: BIOPSY;  Surgeon: Jackquline Denmark, MD;  Location: [place];  Service: Gastroenterology;;  . CARPAL TUNNEL RELEASE Right   . CESAREAN SECTION    . CHOLECYSTECTOMY    .  COLONOSCOPY  08/2019  . ESOPHAGEAL BANDING N/A 09/19/2019   Procedure: ESOPHAGEAL BANDING;  Surgeon: Yetta Flock, MD;  Location: [facility];  Service: Gastroenterology;  Laterality: N/A;  . ESOPHAGOGASTRODUODENOSCOPY (EGD) WITH PROPOFOL N/A 08/20/2019   Procedure: ESOPHAGOGASTRODUODENOSCOPY (EGD) WITH PROPOFOL;  Surgeon: Jackquline Denmark, MD;  Location: [place];  Service: Gastroenterology;  Laterality: N/A;  . ESOPHAGOGASTRODUODENOSCOPY (EGD) WITH PROPOFOL N/A 09/19/2019   Procedure: ESOPHAGOGASTRODUODENOSCOPY (EGD) WITH PROPOFOL;  Surgeon: Yetta Flock, MD;  Location: [facility];  Service: Gastroenterology;  Laterality: N/A;  . ESOPHAGOGASTRODUODENOSCOPY (EGD) WITH PROPOFOL N/A 01/16/2020   Procedure: ESOPHAGOGASTRODUODENOSCOPY (EGD) WITH PROPOFOL;  Surgeon: Jerene Bears, MD;  Location: [place] [facility];  Service: Endoscopy;  Laterality: N/A;  . EYE SURGERY Bilateral    removed cataracts  . GASTRIC VARICES BANDING N/A 08/20/2019   Procedure: GASTRIC VARICES BANDING;  Surgeon: Jackquline Denmark, MD;  Location: [place];  Service: Gastroenterology;   Laterality: N/A;  . HOT HEMOSTASIS N/A 01/16/2020   Procedure: HOT HEMOSTASIS (ARGON PLASMA COAGULATION/BICAP);  Surgeon: Jerene Bears, MD;  Location: [facility] END[facility]COPY;  Service: Endoscopy;  Laterality: N/A;  . IR PARACENTESIS  08/01/2019  . IR PARACENTESIS  08/19/2019  . IR PARACENTESIS  09/09/2019  . IR PARACENTESIS  09/23/2019  . IR PARACENTESIS  10/08/2019  . IR PARACENTESIS  10/23/2019  . IR PARACENTESIS  11/12/2019  . IR PARACENTESIS  11/21/2019  . IR PARACENTESIS  11/25/2019  . IR PARACENTESIS  12/05/2019  . IR PARACENTESIS  12/13/2019  . IR PARACENTESIS  12/20/2019  . IR PARACENTESIS  12/26/2019  . IR PARACENTESIS  12/31/2019  . IR PARACENTESIS  01/07/2020  . IR PARACENTESIS  01/10/2020  . IR PARACENTESIS  01/13/2020  . IR PARACENTESIS  01/14/2020  . IR PARACENTESIS  01/20/2020  . IR PARACENTESIS  01/30/2020  . IR PARACENTESIS  02/24/2020  . IR RADIOLOGIST EVAL & MGMT  10/30/2019  . IR RADIOLOGIST EVAL & MGMT  12/11/2019  . IR RADIOLOGIST EVAL & MGMT  12/24/2019  . IR RADIOLOGIST EVAL & MGMT  03/05/2020  . IR RADIOLOGIST EVAL & MGMT  05/15/2020  . IR TIPS  11/25/2019  . IR TIPS  01/14/2020  . KNEE ARTHROSCOPY Right 12/10/2015   Procedure: RIGHT KNEE ARTHROSCOPY WITH PARTIAL MEDIAL MENISCECTOMY;  Surgeon: Mcarthur Rossetti, MD;  Location: [facility];  Service: Orthopedics;  Laterality: Right;  . KNEE ARTHROSCOPY W/ MENISCAL REPAIR Bilateral   . MENISCUS REPAIR Left   . RADIOLOGY WITH ANESTHESIA N/A 11/25/2019   Procedure: TIPS;  Surgeon: Corrie Mckusick, DO;  Location: [place];  Service: Anesthesiology;  Laterality: N/A;  . RADIOLOGY WITH ANESTHESIA N/A 01/14/2020   Procedure: TIPS;  Surgeon: Corrie Mckusick, DO;  Location: [place];  Service: Anesthesiology;  Laterality: N/A;    Immunization History  Administered Date(s) Administered  . DTaP 03/16/1963, 04/15/1963, 05/16/1963, 01/02/1965, 08/05/1968  . Hep A / Hep B 10/16/2019, 11/15/2019, 04/30/2020  . Hepatitis B 10/27/2003  . IPV 03/16/1963, 05/16/1963,  05/05/1978  . Influenza,inj,Quad PF,6+ Mos 02/02/2016, 01/01/2019, 01/18/2020  . Influenza-Unspecified 02/02/2016, 01/30/2017, 01/11/2018  . MMR 08/15/2003  . Measles / Rubella 03/18/1970  . PFIZER(Purple Top)SARS-COV-2 Vaccination 05/17/2019, 06/07/2019, 05/26/2020  . Pneumococcal Polysaccharide-23 08/09/2016  . Smallpox 03/07/1969  . Tdap 08/09/2016  . Tetanus 08/04/2003  . Zoster Recombinat (Shingrix) 09/25/2018, 12/26/2018    Recent Results (from the past 2160 hour(s))  Basic metabolic panel     Status: Abnormal   Collection Time: 03/20/20  4:15 PM  Result Value Ref  Range   Sodium 138 135 - 145 mEq/L   Potassium 3.6 3.5 - 5.1 mEq/L   Chloride 108 96 - 112 mEq/L   CO2 26 19 - 32 mEq/L   Glucose, Bld 127 (H) 70 - 99 mg/dL   BUN 9 6 - 23 mg/dL   Creatinine, Ser 0.88 0.40 - 1.20 mg/dL   GFR 73.02 >60.00 mL/min    Comment: Calculated using the CKD-EPI Creatinine Equation (2021)   Calcium 7.5 (L) 8.4 - 10.5 mg/dL  Basic metabolic panel     Status: Abnormal   Collection Time: 04/06/20  4:14 PM  Result Value Ref Range   Sodium 138 135 - 145 mEq/L   Potassium 3.1 (L) 3.5 - 5.1 mEq/L   Chloride 106 96 - 112 mEq/L   CO2 24 19 - 32 mEq/L   Glucose, Bld 145 (H) 70 - 99 mg/dL   BUN 11 6 - 23 mg/dL   Creatinine, Ser 0.89 0.40 - 1.20 mg/dL   GFR 72.02 >60.00 mL/min    Comment: Calculated using the CKD-EPI Creatinine Equation (2021)   Calcium 7.7 (L) 8.4 - 10.5 mg/dL  Basic metabolic panel     Status: Abnormal   Collection Time: 04/17/20  1:06 PM  Result Value Ref Range   Sodium 137 135 - 145 mEq/L   Potassium 4.0 3.5 - 5.1 mEq/L   Chloride 109 96 - 112 mEq/L   CO2 23 19 - 32 mEq/L   Glucose, Bld 188 (H) 70 - 99 mg/dL   BUN 12 6 - 23 mg/dL   Creatinine, Ser 0.93 0.40 - 1.20 mg/dL   GFR 68.30 >60.00 mL/min    Comment: Calculated using the CKD-EPI Creatinine Equation (2021)   Calcium 7.8 (L) 8.4 - 10.5 mg/dL  POCT glycosylated hemoglobin (Hb A1C)     Status: None   Collection  Time: 04/29/20  2:20 PM  Result Value Ref Range   Hemoglobin A1C 4.7 4.0 - 5.6 %   HbA1c POC (<> result, manual entry)     HbA1c, POC (prediabetic range)     HbA1c, POC (controlled diabetic range)    Comprehensive metabolic panel     Status: Abnormal   Collection Time: 04/30/20  9:37 AM  Result Value Ref Range   Sodium 139 135 - 145 mEq/L   Potassium 3.0 (L) 3.5 - 5.1 mEq/L   Chloride 108 96 - 112 mEq/L   CO2 25 19 - 32 mEq/L   Glucose, Bld 147 (H) 70 - 99 mg/dL   BUN 11 6 - 23 mg/dL   Creatinine, Ser 0.87 0.40 - 1.20 mg/dL   Total Bilirubin 3.6 (H) 0.2 - 1.2 mg/dL   Alkaline Phosphatase 97 39 - 117 U/L   AST 32 0 - 37 U/L   ALT 13 0 - 35 U/L   Total Protein 5.3 (L) 6.0 - 8.3 g/dL   Albumin 2.3 (L) 3.5 - 5.2 g/dL   GFR 73.97 >60.00 mL/min    Comment: Calculated using the CKD-EPI Creatinine Equation (2021)   Calcium 7.6 (L) 8.4 - 10.5 mg/dL  Lipid panel     Status: Abnormal   Collection Time: 04/30/20  9:37 AM  Result Value Ref Range   Cholesterol 121 0 - 200 mg/dL    Comment: ATP III Classification       Desirable:  < 200 mg/dL               Borderline High:  200 - 239 mg/dL  High:  > = 240 mg/dL   Triglycerides 56.0 0.0 - 149.0 mg/dL    Comment: Normal:  <150 mg/dLBorderline High:  150 - 199 mg/dL   HDL 38.30 (L) >39.00 mg/dL   VLDL 11.2 0.0 - 40.0 mg/dL   LDL Cholesterol 71 0 - 99 mg/dL   Total CHOL/HDL Ratio 3     Comment:                Men          Women1/2 Average Risk     3.4          3.3Average Risk          5.0          4.42X Average Risk          9.6          7.13X Average Risk          15.0          11.0                       NonHDL 82.53     Comment: NOTE:  Non-HDL goal should be 30 mg/dL higher than patient's LDL goal (i.e. LDL goal of < 70 mg/dL, would have non-HDL goal of < 100 mg/dL)  TSH     Status: None   Collection Time: 04/30/20  9:37 AM  Result Value Ref Range   TSH 4.40 0.35 - 4.50 uIU/mL  AFP tumor marker     Status: None   Collection Time:  04/30/20  9:37 AM  Result Value Ref Range   AFP-Tumor Marker 2.7 ng/mL    Comment: Reference Range:   <6.1 The use of AFP as a tumor marker in pregnant females is not recommended. . . This test was performed using the Beckman Coulter chemiluminescent method. Values obtained from different assay methods cannot be used interchangeably. AFP levels, regardless of value, should not be interpreted as absolute evidence of the presence or absence of disease. Marland Kitchen   CBC with Differential/Platelet     Status: Abnormal   Collection Time: 04/30/20  9:37 AM  Result Value Ref Range   WBC 3.7 (L) 4.0 - 10.5 K/uL   RBC 2.80 (L) 3.87 - 5.11 Mil/uL   Hemoglobin 9.1 (L) 12.0 - 15.0 g/dL   HCT 25.6 Repeated and verified X2. (L) 36.0 - 46.0 %   MCV 91.4 78.0 - 100.0 fl   MCHC 35.5 30.0 - 36.0 g/dL   RDW 15.2 11.5 - 15.5 %   Platelets 110.0 (L) 150.0 - 400.0 K/uL   Neutrophils Relative % 63.4 43.0 - 77.0 %   Lymphocytes Relative 18.0 12.0 - 46.0 %   Monocytes Relative 8.5 3.0 - 12.0 %   Eosinophils Relative 8.6 (H) 0.0 - 5.0 %   Basophils Relative 1.5 0.0 - 3.0 %   Neutro Abs 2.4 1.4 - 7.7 K/uL   Lymphs Abs 0.7 0.7 - 4.0 K/uL   Monocytes Absolute 0.3 0.1 - 1.0 K/uL   Eosinophils Absolute 0.3 0.0 - 0.7 K/uL   Basophils Absolute 0.1 0.0 - 0.1 K/uL  Basic metabolic panel     Status: Abnormal   Collection Time: 05/13/20  3:19 PM  Result Value Ref Range   Sodium 135 135 - 145 mEq/L   Potassium 4.0 3.5 - 5.1 mEq/L   Chloride 106 96 - 112 mEq/L   CO2 24 19 - 32 mEq/L   Glucose, Bld  206 (H) 70 - 99 mg/dL   BUN 11 6 - 23 mg/dL   Creatinine, Ser 0.97 0.40 - 1.20 mg/dL   GFR 64.90 >60.00 mL/min    Comment: Calculated using the CKD-EPI Creatinine Equation (2021)   Calcium 8.4 8.4 - 10.5 mg/dL  Basic metabolic panel     Status: Abnormal   Collection Time: 05/26/20  3:50 PM  Result Value Ref Range   Sodium 134 (L) 135 - 145 mEq/L   Potassium 3.9 3.5 - 5.1 mEq/L   Chloride 106 96 - 112 mEq/L   CO2 23  19 - 32 mEq/L   Glucose, Bld 242 (H) 70 - 99 mg/dL   BUN 13 6 - 23 mg/dL   Creatinine, Ser 1.05 0.40 - 1.20 mg/dL   GFR 59.00 (L) >60.00 mL/min    Comment: Calculated using the CKD-EPI Creatinine Equation (2021)   Calcium 8.8 8.4 - 10.5 mg/dL    No results found.     All questions at time of visit were answered - patient instructed to contact office with any additional concerns or updates. ER/RTC precautions were reviewed with the patient as applicable.   Please note: manual typing as well as voice recognition software may have been used to produce this document - typos may escape review. Please contact Dr. Sheppard Coil for any needed clarifications.

## 2020-06-12 NOTE — Patient Instructions (Signed)
Wound care:  Iodoform dressing (yellow gooey gauze) directly on skin ulcer  Cover with Telfa pad  Cover that with fabric tape   Change dressing twice daily  If more redness/pain, let me know and start printed Rx for antibiotics!

## 2020-06-15 ENCOUNTER — Other Ambulatory Visit: Payer: Self-pay | Admitting: Gastroenterology

## 2020-06-15 MED FILL — POTASSIUM CL ER 20 MEQ TAB: 20 | 15 days supply | Qty: 30 | Fill #0

## 2020-06-22 MED FILL — FUROSEMIDE 20 MG TAB: 20 | 30 days supply | Qty: 90 | Fill #0

## 2020-06-24 MED FILL — CIPROFLOXACIN HCL 500 MG TA: 500 | 30 days supply | Qty: 30 | Fill #1

## 2020-06-25 ENCOUNTER — Encounter: Payer: Self-pay | Admitting: Gastroenterology

## 2020-06-25 ENCOUNTER — Ambulatory Visit (INDEPENDENT_AMBULATORY_CARE_PROVIDER_SITE_OTHER): Payer: No Typology Code available for payment source | Admitting: Gastroenterology

## 2020-06-25 ENCOUNTER — Other Ambulatory Visit: Payer: Self-pay

## 2020-06-25 ENCOUNTER — Ambulatory Visit (INDEPENDENT_AMBULATORY_CARE_PROVIDER_SITE_OTHER)
Admission: RE | Admit: 2020-06-25 | Discharge: 2020-06-25 | Disposition: A | Discharge status: Home / Self Care | Payer: No Typology Code available for payment source | Source: Ambulatory Visit | Attending: Gastroenterology | Admitting: Gastroenterology

## 2020-06-25 ENCOUNTER — Other Ambulatory Visit (INDEPENDENT_AMBULATORY_CARE_PROVIDER_SITE_OTHER): Payer: No Typology Code available for payment source

## 2020-06-25 VITALS — BP 108/40 | HR 88 | Ht <= 58 in | Wt 140.4 lb

## 2020-06-25 DIAGNOSIS — R601 Generalized edema: Secondary | ICD-10-CM | POA: Diagnosis not present

## 2020-06-25 DIAGNOSIS — I8510 Secondary esophageal varices without bleeding: Secondary | ICD-10-CM

## 2020-06-25 DIAGNOSIS — J90 Pleural effusion, not elsewhere classified: Secondary | ICD-10-CM

## 2020-06-25 DIAGNOSIS — K7460 Unspecified cirrhosis of liver: Secondary | ICD-10-CM

## 2020-06-25 DIAGNOSIS — K7290 Hepatic failure, unspecified without coma: Secondary | ICD-10-CM

## 2020-06-25 DIAGNOSIS — R188 Other ascites: Secondary | ICD-10-CM

## 2020-06-25 DIAGNOSIS — K7682 Hepatic encephalopathy: Secondary | ICD-10-CM

## 2020-06-25 DIAGNOSIS — D649 Anemia, unspecified: Secondary | ICD-10-CM

## 2020-06-25 DIAGNOSIS — D509 Iron deficiency anemia, unspecified: Secondary | ICD-10-CM

## 2020-06-25 LAB — COMPREHENSIVE METABOLIC PANEL
ALT: 20 U/L (ref 0–35)
AST: 25 U/L (ref 0–37)
Albumin: 3.1 g/dL — ABNORMAL LOW (ref 3.5–5.2)
Alkaline Phosphatase: 136 U/L — ABNORMAL HIGH (ref 39–117)
BUN: 25 mg/dL — ABNORMAL HIGH (ref 6–23)
CO2: 25 mEq/L (ref 19–32)
Calcium: 9.6 mg/dL (ref 8.4–10.5)
Chloride: 89 mEq/L — ABNORMAL LOW (ref 96–112)
Creatinine, Ser: 1.5 mg/dL — ABNORMAL HIGH (ref 0.40–1.20)
GFR: 38.44 mL/min — ABNORMAL LOW (ref >60.00)
Glucose, Bld: 704 mg/dL (ref 70–99)
Potassium: 4.5 mEq/L (ref 3.5–5.1)
Sodium: 122 mEq/L — ABNORMAL LOW (ref 135–145)
Total Bilirubin: 3.8 mg/dL — ABNORMAL HIGH (ref 0.2–1.2)
Total Protein: 6.3 g/dL (ref 6.0–8.3)

## 2020-06-25 LAB — CBC WITH DIFFERENTIAL/PLATELET
Basophils Absolute: 0 10*3/uL (ref 0.0–0.1)
Basophils Relative: 0.9 % (ref 0.0–3.0)
Eosinophils Absolute: 0.4 10*3/uL (ref 0.0–0.7)
Eosinophils Relative: 8.7 % — ABNORMAL HIGH (ref 0.0–5.0)
HCT: 30.6 % — ABNORMAL LOW (ref 36.0–46.0)
Hemoglobin: 10.9 g/dL — ABNORMAL LOW (ref 12.0–15.0)
Lymphocytes Relative: 15.9 % (ref 12.0–46.0)
Lymphs Abs: 0.8 10*3/uL (ref 0.7–4.0)
MCHC: 35.5 g/dL (ref 30.0–36.0)
MCV: 87.2 fl (ref 78.0–100.0)
Monocytes Absolute: 0.4 10*3/uL (ref 0.1–1.0)
Monocytes Relative: 7.3 % (ref 3.0–12.0)
Neutro Abs: 3.3 10*3/uL (ref 1.4–7.7)
Neutrophils Relative %: 67.2 % (ref 43.0–77.0)
Platelets: 109 10*3/uL — ABNORMAL LOW (ref 150.0–400.0)
RBC: 3.51 Mil/uL — ABNORMAL LOW (ref 3.87–5.11)
RDW: 14.7 % (ref 11.5–15.5)
WBC: 4.8 10*3/uL (ref 4.0–10.5)

## 2020-06-25 LAB — PROTIME-INR
INR: 1.3 ratio — ABNORMAL HIGH (ref 0.8–1.0)
Prothrombin Time: 14.3 s — ABNORMAL HIGH (ref 9.6–13.1)

## 2020-06-25 LAB — IBC + FERRITIN
Ferritin: 114.6 ng/mL (ref 10.0–291.0)
Iron: 56 ug/dL (ref 42–145)
Saturation Ratios: 18.6 % — ABNORMAL LOW (ref 20.0–50.0)
Transferrin: 215 mg/dL (ref 212.0–360.0)

## 2020-06-25 LAB — VITAMIN B12: Vitamin B-12: 981 pg/mL — ABNORMAL HIGH (ref 211–911)

## 2020-06-25 LAB — FOLATE: Folate: >23.6 ng/mL (ref >5.9)

## 2020-06-25 NOTE — Patient Instructions (Addendum)
If you are age 58 or older, your body mass index should be between 23-30. Your Body mass index is 29.59 kg/m. If this is out of the aforementioned range listed, please consider follow up with your Primary Care Provider.  If you are age 44 or younger, your body mass index should be between 19-25. Your Body mass index is 29.59 kg/m. If this is out of the aformentioned range listed, please consider follow up with your Primary Care Provider.   Please go to the lab in the basement of our building to have lab work and a Chest Xray done as you leave today. Hit "B" for basement when you get on the elevator.  When the doors open the lab is on your left. Radiology is straight ahead. We will call you with the results. Thank you.  Due to recent changes in healthcare laws, you may see the results of your imaging and laboratory studies on MyChart before your provider has had a chance to review them.  We understand that in some cases there may be results that are confusing or concerning to you. Not all laboratory results come back in the same time frame and the provider may be waiting for multiple results in order to interpret others.  Please give [place] 48 hours in order for your provider to thoroughly review all the results before contacting the office for clarification of your results.   You have been scheduled for an abdominal ultrasound at [facility]iology (1st floor of hospital) on Friday, 3-18 at 8:00am. Please arrive 15 minutes prior to your appointment for registration. Make certain not to have anything to eat or drink 6 hours prior to your appointment. Should you need to reschedule your appointment, please contact radiology at [phone]. This test typically takes about 30 minutes to perform.  Continue your diuretics.  Continue lactulose.  Continue Ciprofloxacin.  You have been scheduled for a follow up appointment on Friday, 4-29 at 10:30am.  Thank you for entrusting me with your care and for  choosing [facility], Dr. Nodaway Cellar

## 2020-06-25 NOTE — Progress Notes (Signed)
HPI :  60 year old female here for a follow-up visit for cirrhosis. See prior notes for full details of her history. She is had a serologic work-up that has been largely unremarkable and this has been suspected related to [facility] in the setting of diabetes vs. cryptogenic. Her course has been complicated by severe ascites and large esophageal varices, and some HE.   Main issue over time had been ascites previously.  She had problems with hyperkalemia from Aldactone, required hospitalization.  On Lasix monotherapy she had hyponatremia and hypokalemia.  She had persistent ascites leading to large volume paracentesis.  She has had a large esophageal varices on screening and given she cannot tolerate beta-blockade she was treated initially with banding for preventative therapy.  Eventually due to her persistent ascites she had a DIPS procedure with IR (traditional TIPS failed due to anatomy).  Her course is also been complicated by history of SBP, on chronic Cipro due to history of sulfa allergy.  I have referred her to hepatology for transplant evaluation however her insurance has not approve this to be seen locally by [facility] hepatology office, this remains pending.  In recent months she has been doing much better than previous.  It has taken some time for her to recover from the DIPs procedure.  We have her on a diuretic regimen of Lasix 40 mg twice daily as well as amiloride 20 mg in the morning and 10 mg in the afternoon.  At her last visit she had run out of the amiloride and start retaining fluid.  Since have seen her over the past 1 to 2 months her edema is significantly better.  She had a follow-up CT scan of her chest given a prior abnormal x-ray, this was done in January and continues to show bilateral pleural effusions and significant ascites.  She has developed a periumbilical hernia, which can swell at times.  She had a superficial ulceration there for which she is used some topical creams in  the past 2 weeks and perhaps slightly better.  Does not appear infected, no purulent drainage.  The past several weeks she states her breathing is significantly improved.  Her lower extremity edema is better.  I had filled out some paperwork to allow her to have a stool at work to keep her legs lifted and more time to use the bathroom in light of her diuretics.  Unfortunately these requests were declined? She had an echocardiogram on September 15 which showed an EF of 60 to 65%.  She does have a history of iron deficiency anemia with a EGD and colonoscopy done recently as below. Suspect anemia could be multifactorial and in part related to portal hypertensive gastritis and colonic AVMs.   Endoscopic history: EGD 08/20/19 - Grade II and large (>5 mm) esophageal varices. Banded x 5 - Portal hypertensive gastropathy. - Single nodule in duodenal bulb. Biopsied.  EGD 09/19/19 - - Esophageal varices noted as outlined above - 5 bands placed resulting in deflation. - Normal stomach. - Benign small polyp of the duodenum previously biopsied, not biopsied today. - Normal duodenum otherwise  EGD 01/16/20 -  - small esophageal varices not amenable to banding - 2 small gastric AVMs  Colonoscopy4/13/21 -A few small, nonbleeding sigmoid, descending, IC valve AVMs, were not treated or ablated. Small internal hemorrhoids.  Echo 01/15/2020 - EF 60 to 65%.  US doppler liver - 02/24/20 - patent DIPS  CT scan 01/21/20 - IMPRESSION: 1. Moderate to large bilateral pleural effusions.  2. Cirrhosis with stigmata of portal hypertension. The patient is now status post DIPS. The stent appears to be grossly well positioned. 3. Moderate volume abdominal ascites. 4. Anasarca.   CT chest 05/06/20 - IMPRESSION: 1. Persistent moderate-sized dependent bilateral pleural effusions with compressive atelectasis at both lung bases. Improved aeration of the right lung base and worsened aeration of the lingula.  No evidence of pneumonia. 2. Anasarca with generalized soft tissue edema throughout the chest and abdominal wall. 3. Large amount of ascites, increased in volume compared with prior CT. Stable appearance of the portosystemic shunt. 4. Aortic Atherosclerosis (ICD10-I70.0).        Past Medical History:  Diagnosis Date  . Acute medial meniscus tear of right knee 12/10/2015  . Allergy   . Anasarca   . Arthritis    bil feet  . Ascites   . Cataract    bilateral - surgery to remove  . Cirrhosis ([place])   . Diabetes mellitus without complication ([place])    type 2 - last a1c was 5.2 per patient, no meds  . Dyspnea    when I have too much fluid  . Esophageal varices ([place])   . GERD (gastroesophageal reflux disease)   . Heart murmur   . Hepatic cirrhosis ([place])   . HLD (hyperlipidemia)    no meds  . Hypertension    no meds  . Iron deficiency anemia 09/26/2018     Past Surgical History:  Procedure Laterality Date  . BIOPSY  08/20/2019   Procedure: BIOPSY;  Surgeon: Jackquline Denmark, MD;  Location: [place];  Service: Gastroenterology;;  . CARPAL TUNNEL RELEASE Right   . CESAREAN SECTION    . CHOLECYSTECTOMY    . COLONOSCOPY  08/2019  . ESOPHAGEAL BANDING N/A 09/19/2019   Procedure: ESOPHAGEAL BANDING;  Surgeon: Yetta Flock, MD;  [facility];  Service: Gastroenterology;  Laterality: N/A;  . ESOPHAGOGASTRODUODENOSCOPY (EGD) WITH PROPOFOL N/A 08/20/2019   Procedure: ESOPHAGOGASTRODUODENOSCOPY (EGD) WITH PROPOFOL;  Surgeon: Jackquline Denmark, MD;  Location: [place];  Service: Gastroenterology;  Laterality: N/A;  . ESOPHAGOGASTRODUODENOSCOPY (EGD) WITH PROPOFOL N/A 09/19/2019   Procedure: ESOPHAGOGASTRODUODENOSCOPY (EGD) WITH PROPOFOL;  Surgeon: Yetta Flock, MD;  [facility];  Service: Gastroenterology;  Laterality: N/A;  . ESOPHAGOGASTRODUODENOSCOPY (EGD) WITH PROPOFOL N/A 01/16/2020   Procedure: ESOPHAGOGASTRODUODENOSCOPY (EGD) WITH PROPOFOL;   Surgeon: Jerene Bears, MD;  Location: [place] [facility];  Service: Endoscopy;  Laterality: N/A;  . EYE SURGERY Bilateral    removed cataracts  . GASTRIC VARICES BANDING N/A 08/20/2019   Procedure: GASTRIC VARICES BANDING;  Surgeon: Jackquline Denmark, MD;  Location: [place];  Service: Gastroenterology;  Laterality: N/A;  . HOT HEMOSTASIS N/A 01/16/2020   Procedure: HOT HEMOSTASIS (ARGON PLASMA COAGULATION/BICAP);  Surgeon: Jerene Bears, MD;  Location: [facility] END[facility]COPY;  Service: Endoscopy;  Laterality: N/A;  . IR PARACENTESIS  08/01/2019  . IR PARACENTESIS  08/19/2019  . IR PARACENTESIS  09/09/2019  . IR PARACENTESIS  09/23/2019  . IR PARACENTESIS  10/08/2019  . IR PARACENTESIS  10/23/2019  . IR PARACENTESIS  11/12/2019  . IR PARACENTESIS  11/21/2019  . IR PARACENTESIS  11/25/2019  . IR PARACENTESIS  12/05/2019  . IR PARACENTESIS  12/13/2019  . IR PARACENTESIS  12/20/2019  . IR PARACENTESIS  12/26/2019  . IR PARACENTESIS  12/31/2019  . IR PARACENTESIS  01/07/2020  . IR PARACENTESIS  01/10/2020  . IR PARACENTESIS  01/13/2020  . IR PARACENTESIS  01/14/2020  . IR PARACENTESIS  01/20/2020  .  IR PARACENTESIS  01/30/2020  . IR PARACENTESIS  02/24/2020  . IR RADIOLOGIST EVAL & MGMT  10/30/2019  . IR RADIOLOGIST EVAL & MGMT  12/11/2019  . IR RADIOLOGIST EVAL & MGMT  12/24/2019  . IR RADIOLOGIST EVAL & MGMT  03/05/2020  . IR RADIOLOGIST EVAL & MGMT  05/15/2020  . IR TIPS  11/25/2019  . IR TIPS  01/14/2020  . KNEE ARTHROSCOPY Right 12/10/2015   Procedure: RIGHT KNEE ARTHROSCOPY WITH PARTIAL MEDIAL MENISCECTOMY;  Surgeon: Mcarthur Rossetti, MD;  [facility]: [facility];  Service: Orthopedics;  Laterality: Right;  . KNEE ARTHROSCOPY W/ MENISCAL REPAIR Bilateral   . MENISCUS REPAIR Left   . RADIOLOGY WITH ANESTHESIA N/A 11/25/2019   Procedure: TIPS;  Surgeon: Corrie Mckusick, DO;  Location: [place];  Service: Anesthesiology;  Laterality: N/A;  . RADIOLOGY WITH ANESTHESIA N/A 01/14/2020   Procedure: TIPS;  Surgeon: Corrie Mckusick, DO;   Location: [place];  Service: Anesthesiology;  Laterality: N/A;   Family History  Problem Relation Age of Onset  . Hypertension Mother   . COPD Mother   . Alcoholism Father   . Heart attack Father   . Diabetes Brother   . Diabetes Paternal Grandmother   . Heart disease Paternal Grandmother   . Alcoholism Paternal Uncle   . Alcoholism Maternal Uncle   . Heart disease Maternal Grandmother   . Heart disease Maternal Grandfather   . Heart disease Paternal Grandfather    Social History   Tobacco Use  . Smoking status: Never Smoker  . Smokeless tobacco: Never Used  Vaping Use  . Vaping Use: Never used  Substance Use Topics  . Alcohol use: No  . Drug use: No   Current Outpatient Medications  Medication Sig Dispense Refill  . ACCU-CHEK FASTCLIX LANCETS MISC Check fsbs TID    . AMBULATORY NON FORMULARY MEDICATION Wedge pillow 1 each 0  . aMILoride (MIDAMOR) 5 MG tablet Take 4 tablets in the morning and 2 tablets in the evening 180 tablet 2  . cetirizine (ZYRTEC) 10 MG tablet Take 1 tablet (10 mg total) by mouth daily. (Patient taking differently: Take 10 mg by mouth daily as needed for allergies.) 90 tablet 3  . Cholecalciferol (VITAMIN D3) 5000 UNITS TABS Take 5,000 Units by mouth daily.     . ciprofloxacin (CIPRO) 500 MG tablet TAKE 1 TABLET (500 MG TOTAL) BY MOUTH DAILY. 30 tablet 2  . clindamycin (CLEOCIN) 300 MG capsule Take 1 capsule (300 mg total) by mouth 3 (three) times daily. 21 capsule 0  . ferrous sulfate 325 (65 FE) MG EC tablet Take 1 tablet (325 mg total) by mouth 2 (two) times daily with a meal. 180 tablet 1  . furosemide (LASIX) 20 MG tablet Take 40 mg (2 tablets) by mouth every morning and 20 mg (1 tablet) by mouth at bedtime (Patient taking differently: Take 40 mg (2 tablets) by mouth every morning and 40 mg (2 tablet) by mouth at bedtime) 90 tablet 1  . Krill Oil 500 MG CAPS Take 500 mg by mouth daily.    Marland Kitchen lactulose (CHRONULAC) 10 GM/15ML solution Take 15 mLs (10 g  total) by mouth daily. Please keep your appt with Dr. Havery Moros on Thursday, 04-30-20. 473 mL 0  . Magnesium Oxide 400 (240 Mg) MG TABS TAKE 1 TABLET (400 MG TOTAL) BY MOUTH 2 (TWO) TIMES DAILY. 120 tablet 1  . pantoprazole (PROTONIX) 40 MG tablet Take 1 tablet (40 mg total) by mouth daily. 90 tablet  3  . potassium chloride SA (KLOR-CON) 20 MEQ tablet Take 20 mEq by mouth 2 (two) times daily.     Current Facility-Administered Medications  Medication Dose Route Frequency Provider Last Rate Last Admin  . albumin human 25 % solution 25 g  25 g Intravenous Once Levin Erp, [place]       Allergies  Allergen Reactions  . Penicillins Anaphylaxis    Happened as a young child  . Atrovent [Ipratropium] Palpitations    As per patient  . Naproxen Rash  . Quinine Derivatives Rash  . Sulfa Antibiotics Rash     Review of Systems: All systems reviewed and negative except where noted in HPI.   Lab Results  Component Value Date   WBC 3.7 (L) 04/30/2020   HGB 9.1 (L) 04/30/2020   HCT 25.6 Repeated and verified X2. (L) 04/30/2020   MCV 91.4 04/30/2020   PLT 110.0 (L) 04/30/2020   Lab Results  Component Value Date   CREATININE 1.05 05/26/2020   BUN 13 05/26/2020   NA 134 (L) 05/26/2020   K 3.9 05/26/2020   CL 106 05/26/2020   CO2 23 05/26/2020    Lab Results  Component Value Date   ALT 13 04/30/2020   AST 32 04/30/2020   ALKPHOS 97 04/30/2020   BILITOT 3.6 (H) 04/30/2020   Lab Results  Component Value Date   INR 1.2 (H) 01/27/2020   INR 1.6 (H) 01/14/2020   INR 1.4 (H) 01/10/2020     Physical Exam: BP (!) 108/40 (BP Location: Left Arm, Patient Position: Sitting, Cuff Size: Normal)   Pulse 88   Ht 4' 9.75" (1.467 m)   Wt 140 lb 6 oz (63.7 kg)   LMP 12/03/2013 (LMP Unknown)   BMI 29.59 kg/m  Constitutional: Pleasant,well-developed, female in no acute distress. HEENT: Normocephalic and atraumatic. Conjunctivae are normal. No scleral icterus. Neck supple.   Cardiovascular: Normal rate, regular rhythm.  Pulmonary/chest: Effort normal and breath sounds normal. Mostly clear, slightly decreased in bases but improved from previous Abdominal: Soft, (+) ascites but not tight, nontender. Periumbilical hernia with small superficial ulceration in superior aspect, mild erythema, healing, no purulence  Extremities: (+) 1 BL lower extremity edema Lymphadenopathy: No cervical adenopathy noted. Neurological: Alert and oriented to person place and time. No asterixis Skin: Skin is warm and dry. No rashes noted. Psychiatric: Normal mood and affect. Behavior is normal.   ASSESSMENT AND PLAN: 60 year old female here for reassessment the following  Cirrhosis with refractory ascites Anasarca Esophageal varices Hepatic encephalopathy Pleural effusions Anemia  Decompensated cirrhosis likely due to NASH versus cryptogenic, with refractory ascites and esophageal varices as well as hepatic encephalopathy.  Also with a history of SBP.  Intolerant to beta blockade, intolerant to higher doses of diuretics previously.  Initially traditional TIPS failed but then underwent successful DIPS per IR.  Since that time she has been able to tolerate higher doses of diuretics and her anasarca/edema is slowly improving.  She did have a period of time where she ran out of diuretics and reaccumulated fluid but over the past 2 months has been doing much better.  Her exam is improved today.  I will recheck her labs today to include kidney function, electrolytes, INR, CBC, iron studies, B12 folate.  We may increase amiloride slightly if her renal function can tolerate it.  Continue low-sodium diet.  I will obtain a chest x-ray today to see if her effusions are better.  She will continue Cipro for SBP prophylaxis  given allergy to Bactrim.  She will continue lactulose.  She is due for [facility] screening in March.  Her vaccinations are up-to-date -completed hep a and B series as well as Covid booster  recently.  She warrants a referral to hepatology, have done that locally to [facility], however her insurance will not approve it.  I encouraged her to talk to her insurance to see where we can refer her to get this process started.  I will continue to monitor closely and see her again in 2 months for these issues.    Plan: - CBC, CMET, INR, TIBC / ferritin, B12 / folate - CXR - PA / lat - reassess pleural effusions - continue diuretics, may increase amiloride pending course / labs - continue cipro - continue lactulose - continue low Na diet - RUQ [place] in March for [facility] screening - patient to call her insurance about hepatology consultation and where she can receive evaluation for this - f/u 2 months    Patient in agreement. All questions answered.  Alamo Cellar, MD [facility] Gastroenterology

## 2020-06-26 ENCOUNTER — Ambulatory Visit (INDEPENDENT_AMBULATORY_CARE_PROVIDER_SITE_OTHER): Payer: No Typology Code available for payment source | Admitting: Medical-Surgical

## 2020-06-26 ENCOUNTER — Other Ambulatory Visit: Payer: Self-pay

## 2020-06-26 ENCOUNTER — Encounter: Payer: Self-pay | Admitting: Medical-Surgical

## 2020-06-26 VITALS — BP 113/67 | HR 78 | Temp 98.4°F | Ht <= 58 in | Wt 136.9 lb

## 2020-06-26 DIAGNOSIS — E119 Type 2 diabetes mellitus without complications: Secondary | ICD-10-CM | POA: Diagnosis not present

## 2020-06-26 DIAGNOSIS — Z794 Long term (current) use of insulin: Secondary | ICD-10-CM | POA: Diagnosis not present

## 2020-06-26 LAB — POCT URINALYSIS DIP (CLINITEK)
Bilirubin, UA: NEGATIVE
Glucose, UA: >=1000 mg/dL — AB
Ketones, POC UA: NEGATIVE mg/dL
Leukocytes, UA: NEGATIVE
Nitrite, UA: NEGATIVE
POC PROTEIN,UA: 30 — AB
Spec Grav, UA: 1.02 (ref 1.010–1.025)
Urobilinogen, UA: 2 E.U./dL — AB
pH, UA: 6 (ref 5.0–8.0)

## 2020-06-26 LAB — POCT UA - MICROALBUMIN
Creatinine, POC: 100 mg/dL
Microalbumin Ur, POC: 80 mg/L

## 2020-06-26 LAB — GLUCOSE, POCT (MANUAL RESULT ENTRY): POC Glucose: 436 mg/dl — AB (ref 70–99)

## 2020-06-26 NOTE — Progress Notes (Signed)
Subjective:    CC: hyperglycemia  HPI: Pleasant 60 year old female presenting for evaluation of hyperglycemia. Was seen by GI yesterday with labs drawn. Her glucose resulted at 704 and she was advised to go to the ED or call her PCP's office. She returned home and rechecked her sugar, noting that it was improving some. Notes that she was taken off all of her diabetes medications in the last year because her sugar had been so good. Her last hemoglobin A1c was 4.7% in December. She doesn't feel that anything has changed with her diet or medications and is shocked at how high her glucose was. Endorses fatigue, polydipsia, and polyuria. Was instructed by GI to hold her diuretics yesterday afternoon and today due to hypokalemia/hyperglycemia and mildly decreased kidney function. Has a glucometer at home and is checking her blood sugar regularly. Denies fever, chills, chest pain, shortness of breath, and unusual GI s/s.  I reviewed the past medical history, family history, social history, surgical history, and allergies today and no changes were needed.  Please see the problem list section below in epic for further details.  Past Medical History: Past Medical History:  Diagnosis Date  . Acute medial meniscus tear of right knee 12/10/2015  . Allergy   . Anasarca   . Arthritis    bil feet  . Ascites   . Cataract    bilateral - surgery to remove  . Cirrhosis ([place])   . Diabetes mellitus without complication ([place])    type 2 - last a1c was 5.2 per patient, no meds  . Dyspnea    when I have too much fluid  . Esophageal varices ([place])   . GERD (gastroesophageal reflux disease)   . Heart murmur   . Hepatic cirrhosis ([place])   . HLD (hyperlipidemia)    no meds  . Hypertension    no meds  . Iron deficiency anemia 09/26/2018   Past Surgical History: Past Surgical History:  Procedure Laterality Date  . BIOPSY  08/20/2019   Procedure: BIOPSY;  Surgeon: Jackquline Denmark, MD;  Location: [place];   Service: Gastroenterology;;  . CARPAL TUNNEL RELEASE Right   . CESAREAN SECTION    . CHOLECYSTECTOMY    . COLONOSCOPY  08/2019  . ESOPHAGEAL BANDING N/A 09/19/2019   Procedure: ESOPHAGEAL BANDING;  Surgeon: Yetta Flock, MD;  [facility];  Service: Gastroenterology;  Laterality: N/A;  . ESOPHAGOGASTRODUODENOSCOPY (EGD) WITH PROPOFOL N/A 08/20/2019   Procedure: ESOPHAGOGASTRODUODENOSCOPY (EGD) WITH PROPOFOL;  Surgeon: Jackquline Denmark, MD;  Location: [place];  Service: Gastroenterology;  Laterality: N/A;  . ESOPHAGOGASTRODUODENOSCOPY (EGD) WITH PROPOFOL N/A 09/19/2019   Procedure: ESOPHAGOGASTRODUODENOSCOPY (EGD) WITH PROPOFOL;  Surgeon: Yetta Flock, MD;  [facility];  Service: Gastroenterology;  Laterality: N/A;  . ESOPHAGOGASTRODUODENOSCOPY (EGD) WITH PROPOFOL N/A 01/16/2020   Procedure: ESOPHAGOGASTRODUODENOSCOPY (EGD) WITH PROPOFOL;  Surgeon: Jerene Bears, MD;  Location: [place] [facility];  Service: Endoscopy;  Laterality: N/A;  . EYE SURGERY Bilateral    removed cataracts  . GASTRIC VARICES BANDING N/A 08/20/2019   Procedure: GASTRIC VARICES BANDING;  Surgeon: Jackquline Denmark, MD;  Location: [place];  Service: Gastroenterology;  Laterality: N/A;  . HOT HEMOSTASIS N/A 01/16/2020   Procedure: HOT HEMOSTASIS (ARGON PLASMA COAGULATION/BICAP);  Surgeon: Jerene Bears, MD;  Location: [facility] END[facility]COPY;  Service: Endoscopy;  Laterality: N/A;  . IR PARACENTESIS  08/01/2019  . IR PARACENTESIS  08/19/2019  . IR PARACENTESIS  09/09/2019  . IR PARACENTESIS  09/23/2019  . IR PARACENTESIS  10/08/2019  .  IR PARACENTESIS  10/23/2019  . IR PARACENTESIS  11/12/2019  . IR PARACENTESIS  11/21/2019  . IR PARACENTESIS  11/25/2019  . IR PARACENTESIS  12/05/2019  . IR PARACENTESIS  12/13/2019  . IR PARACENTESIS  12/20/2019  . IR PARACENTESIS  12/26/2019  . IR PARACENTESIS  12/31/2019  . IR PARACENTESIS  01/07/2020  . IR PARACENTESIS  01/10/2020  . IR PARACENTESIS  01/13/2020  . IR  PARACENTESIS  01/14/2020  . IR PARACENTESIS  01/20/2020  . IR PARACENTESIS  01/30/2020  . IR PARACENTESIS  02/24/2020  . IR RADIOLOGIST EVAL & MGMT  10/30/2019  . IR RADIOLOGIST EVAL & MGMT  12/11/2019  . IR RADIOLOGIST EVAL & MGMT  12/24/2019  . IR RADIOLOGIST EVAL & MGMT  03/05/2020  . IR RADIOLOGIST EVAL & MGMT  05/15/2020  . IR TIPS  11/25/2019  . IR TIPS  01/14/2020  . KNEE ARTHROSCOPY Right 12/10/2015   Procedure: RIGHT KNEE ARTHROSCOPY WITH PARTIAL MEDIAL MENISCECTOMY;  Surgeon: Mcarthur Rossetti, MD;  [facility];  Service: Orthopedics;  Laterality: Right;  . KNEE ARTHROSCOPY W/ MENISCAL REPAIR Bilateral   . MENISCUS REPAIR Left   . RADIOLOGY WITH ANESTHESIA N/A 11/25/2019   Procedure: TIPS;  Surgeon: Corrie Mckusick, DO;  Location: [place];  Service: Anesthesiology;  Laterality: N/A;  . RADIOLOGY WITH ANESTHESIA N/A 01/14/2020   Procedure: TIPS;  Surgeon: Corrie Mckusick, DO;  Location: [place];  Service: Anesthesiology;  Laterality: N/A;   Social History: Social History   Socioeconomic History  . Marital status: Married    Spouse name: Cash Meadow  . Number of children: 1  . Years of education: Not on file  . Highest education level: Associate degree: academic program  Occupational History  . Not on file  Tobacco Use  . Smoking status: Never Smoker  . Smokeless tobacco: Never Used  Vaping Use  . Vaping Use: Never used  Substance and Sexual Activity  . Alcohol use: No  . Drug use: No  . Sexual activity: Yes    Partners: Male    Birth control/protection: None, Post-menopausal  Other Topics Concern  . Not on file  Social History Narrative  . Not on file   Social Determinants of Health   Financial Resource Strain: Not on file  Food Insecurity: Not on file  Transportation Needs: Not on file  Physical Activity: Not on file  Stress: Not on file  Social Connections: Not on file   Family History: Family History  Problem Relation Age of Onset  . Hypertension  Mother   . COPD Mother   . Alcoholism Father   . Heart attack Father   . Diabetes Brother   . Diabetes Paternal Grandmother   . Heart disease Paternal Grandmother   . Alcoholism Paternal Uncle   . Alcoholism Maternal Uncle   . Heart disease Maternal Grandmother   . Heart disease Maternal Grandfather   . Heart disease Paternal Grandfather    Allergies: Allergies  Allergen Reactions  . Penicillins Anaphylaxis    Happened as a young child  . Atrovent [Ipratropium] Palpitations    As per patient  . Naproxen Rash  . Quinine Derivatives Rash  . Sulfa Antibiotics Rash   Medications: See med rec.  Review of Systems: See HPI for pertinent positives and negatives.   Objective:    General: Well Developed, well nourished, and in no acute distress.  Neuro: Alert and oriented x3.  HEENT: Normocephalic, atraumatic.  Skin: Warm and dry. Cardiac:  Regular rate and rhythm, no murmurs rubs or gallops, no lower extremity edema.  Respiratory: Clear to auscultation bilaterally. Not using accessory muscles, speaking in full sentences.  Impression and Recommendations:    1. Type 2 diabetes mellitus without complication, with long-term current use of insulin (HCC) POCT glucose 436. POCT UA negative for ketones, + for >1054m/dL of glucose plus small amount of protein and trace-lysed blood. POCT Microalbumin abnormal. Urine sent for culture. Discussed restarting diabetes medications. With her glucose greater than 400, restarting long acting insulin [place] at 10 units daily, first dose self-administered in the office. Insulin pen provided (300 unit pen) along with several pen needles. Continue monitoring glucose at home. Recommend close follow up with PCP on Monday to evaluate status and determine further course. Patient verbalized understanding and is agreeable to the plan.  - POCT glucose (manual entry) - POCT URINALYSIS DIP (CLINITEK) - Urine Culture - POCT UA - Microalbumin  Return in about 3  days (around 06/29/2020) for DM follow up. ___________________________________________ JClearnce Sorrel DNP, APRN, FNP-BC [facility] and [place]

## 2020-06-28 LAB — URINE CULTURE
MICRO NUMBER:: 11582693
SPECIMEN QUALITY:: ADEQUATE

## 2020-06-29 ENCOUNTER — Other Ambulatory Visit (HOSPITAL_BASED_OUTPATIENT_CLINIC_OR_DEPARTMENT_OTHER): Payer: Self-pay | Admitting: Osteopathic Medicine

## 2020-06-29 ENCOUNTER — Ambulatory Visit (INDEPENDENT_AMBULATORY_CARE_PROVIDER_SITE_OTHER): Payer: No Typology Code available for payment source | Admitting: Osteopathic Medicine

## 2020-06-29 ENCOUNTER — Other Ambulatory Visit: Payer: Self-pay

## 2020-06-29 ENCOUNTER — Encounter: Payer: Self-pay | Admitting: Osteopathic Medicine

## 2020-06-29 VITALS — BP 146/74 | HR 76 | Temp 98.0°F | Wt 139.1 lb

## 2020-06-29 DIAGNOSIS — K2289 Other specified disease of esophagus: Secondary | ICD-10-CM

## 2020-06-29 DIAGNOSIS — E119 Type 2 diabetes mellitus without complications: Secondary | ICD-10-CM

## 2020-06-29 DIAGNOSIS — I8510 Secondary esophageal varices without bleeding: Secondary | ICD-10-CM

## 2020-06-29 DIAGNOSIS — K7460 Unspecified cirrhosis of liver: Secondary | ICD-10-CM

## 2020-06-29 DIAGNOSIS — K7581 Nonalcoholic steatohepatitis (NASH): Secondary | ICD-10-CM | POA: Diagnosis not present

## 2020-06-29 DIAGNOSIS — K31819 Angiodysplasia of stomach and duodenum without bleeding: Secondary | ICD-10-CM | POA: Diagnosis not present

## 2020-06-29 DIAGNOSIS — Z794 Long term (current) use of insulin: Secondary | ICD-10-CM

## 2020-06-29 MED ORDER — TRESIBA FLEXTOUCH 200 UNIT/ML ~~LOC~~ SOPN: 10.0000 [IU] | PEN_INJECTOR | Freq: Every day | SUBCUTANEOUS | 99 refills | Status: DC

## 2020-06-29 MED FILL — TRESIBA FLEXTOUCH 200 UNITS: 200 | 60 days supply | Qty: 15 | Fill #0

## 2020-06-29 NOTE — Progress Notes (Signed)
Meagan Peck is a 60 y.o. female who presents to  [facility]  today, 06/29/20, seeking care for the following:  . Hyperglycemia -  o DM2 had been well controlled and we had stopped her insulin altogether 04/2019 d/t A1C about 5 and hypoglycemia. Developed ascites 07/2019 and subsequent Dx cirrhosis. Stopped Meformin 10/2019.  o Glc had been good but started climbing about a few months ago. Last A1C 2 mos ago was 4.7. On BMP/CMP's, non-fasting into 180s 2 mos ago, back down to 147, up to 200s a month ago then shot up to 700s in the past week.  o I was out of the office last weke, pt seen by my colleague Samuel Bouche FNP 06/26/20: reviewed notes, labs UA (-)ketones, mild anemia Hgb 10.9 06/25/20, Glc 704, sodium corrects to 132-136, Cr up to 1.5 from baseline 0.9-1.0, albumin low at 3.1 but improved from previous. Joy started her on basal insulin w/ Toujeo sample given in office to start at 10 units daily  o Reports today fasting Glc 247, still feeling tired but overall better than last week      [place] with other pertinent findings:  The primary encounter diagnosis was Type 2 diabetes mellitus without complication, with long-term current use of insulin ([place]). Diagnoses of NASH (nonalcoholic steatohepatitis), Angiodysplasia of upper gastrointestinal tract, Secondary esophageal varices without bleeding ([place]), and Liver cirrhosis secondary to NASH [facility]) were also pertinent to this visit.    Patient Instructions  Plan:  Increase Touejo by 2 units every day to fasting sugar around 150 or so then stay at that number of units.   I sent Rx for Tresiba insulin (similar to Toujeo but better covered by your insurance) for when you use up the [employer]. Use the same number of units of insulin.   Will recheck labs today to make sure everything going back in the right direction, especially kidney function.      Orders Placed This  Encounter  Procedures  . CBC  . COMPLETE METABOLIC PANEL WITH GFR  . Amb Referral to Hepatology    Meds ordered this encounter  Medications  . insulin degludec (TRESIBA FLEXTOUCH) 200 UNIT/ML FlexTouch Pen    Sig: Inject 10-50 Units into the skin daily. Target to fasting Glc 150    Dispense:  15 mL    Refill:  99     See below for relevant physical exam findings  See below for recent lab and imaging results reviewed  Medications, allergies, PMH, PSH, SocH, FamH reviewed below    Follow-up instructions: Return in about 10 days (around 07/09/2020) for recheck sugars etc (virtual visit ok) .                                        Exam:  BP (!) 146/74 (BP Location: Left Arm, Patient Position: Sitting, Cuff Size: Normal)   Pulse 76   Temp 98 F (36.7 C) (Oral)   Wt 139 lb 1.3 oz (63.1 kg)   LMP 12/03/2013 (LMP Unknown)   BMI 29.32 kg/m   Constitutional: VS see above. General Appearance: alert, well-developed, well-nourished, NAD  Neck: No masses, trachea midline.   Respiratory: Normal respiratory effort. no wheeze, no rhonchi, no rales  Cardiovascular: S1/S2 normal, no murmur, no rub/gallop auscultated. RRR.   Musculoskeletal: Gait normal. Symmetric and independent movement of  all extremities  Abdominal: non-tender, MILDLY distended, no appreciable organomegaly, neg Murphy's.   Neurological: Normal balance/coordination. No tremor.  Skin: warm, dry, intact. Mild jaundice. Reexamined area of concern at umbilicus - looks much better than previous examination still some very mild skin breakdown but healing   Psychiatric: Normal judgment/insight. Normal mood and affect. Oriented x3.   Current Meds  Medication Sig  . insulin degludec (TRESIBA FLEXTOUCH) 200 UNIT/ML FlexTouch Pen Inject 10-50 Units into the skin daily. Target to fasting Glc 150   Current Facility-Administered Medications for the 06/29/20 encounter (Office Visit) with  [name], DO  Medication  . albumin human 25 % solution 25 g    Allergies  Allergen Reactions  . Penicillins Anaphylaxis    Happened as a young child  . Atrovent [Ipratropium] Palpitations    As per patient  . Naproxen Rash  . Quinine Derivatives Rash  . Sulfa Antibiotics Rash    Patient Active Problem List   Diagnosis Date Noted  . Liver cirrhosis secondary to NASH ([place]) 01/27/2020  . Abnormal weight gain 01/27/2020  . Anasarca 01/27/2020  . Electrolyte disturbance 01/27/2020  . Hospital discharge follow-up 01/27/2020  . NASH (nonalcoholic steatohepatitis)   . Portal hypertension ([place])   . Angiodysplasia of upper gastrointestinal tract   . SBP (spontaneous bacterial peritonitis) ([place]) 01/08/2020  . Esophageal varices without bleeding ([place])   . Hypertension associated with diabetes ([place]) 08/16/2019  . Vitamin D deficiency 05/15/2017  . Iron deficiency 05/15/2017  . Breast screening declined 05/15/2017  . Colonoscopy refused 05/15/2017  . Choroidal nevus of both eyes 07/18/2016  . Corneal epithelial and basement membrane dystrophy 07/18/2016  . Thrombocytopenia ([place]) 04/14/2016  . Hyperlipidemia associated with type 2 diabetes mellitus ([place]) 05/25/2015  . Gastroesophageal reflux disease 05/25/2015  . Type 2 diabetes mellitus with other specified complication ([place]) 11/94/1740    Family History  Problem Relation Age of Onset  . Hypertension Mother   . COPD Mother   . Alcoholism Father   . Heart attack Father   . Diabetes Brother   . Diabetes Paternal Grandmother   . Heart disease Paternal Grandmother   . Alcoholism Paternal Uncle   . Alcoholism Maternal Uncle   . Heart disease Maternal Grandmother   . Heart disease Maternal Grandfather   . Heart disease Paternal Grandfather     Social History   Tobacco Use  Smoking Status Never Smoker  Smokeless Tobacco Never Used    Past Surgical History:  Procedure Laterality Date  . BIOPSY  08/20/2019    Procedure: BIOPSY;  Surgeon: Jackquline Denmark, MD;  Location: [place];  Service: Gastroenterology;;  . CARPAL TUNNEL RELEASE Right   . CESAREAN SECTION    . CHOLECYSTECTOMY    . COLONOSCOPY  08/2019  . ESOPHAGEAL BANDING N/A 09/19/2019   Procedure: ESOPHAGEAL BANDING;  Surgeon: Yetta Flock, MD;  Location: [facility];  Service: Gastroenterology;  Laterality: N/A;  . ESOPHAGOGASTRODUODENOSCOPY (EGD) WITH PROPOFOL N/A 08/20/2019   Procedure: ESOPHAGOGASTRODUODENOSCOPY (EGD) WITH PROPOFOL;  Surgeon: Jackquline Denmark, MD;  Location: [place];  Service: Gastroenterology;  Laterality: N/A;  . ESOPHAGOGASTRODUODENOSCOPY (EGD) WITH PROPOFOL N/A 09/19/2019   Procedure: ESOPHAGOGASTRODUODENOSCOPY (EGD) WITH PROPOFOL;  Surgeon: Yetta Flock, MD;  Location: [facility];  Service: Gastroenterology;  Laterality: N/A;  . ESOPHAGOGASTRODUODENOSCOPY (EGD) WITH PROPOFOL N/A 01/16/2020   Procedure: ESOPHAGOGASTRODUODENOSCOPY (EGD) WITH PROPOFOL;  Surgeon: Jerene Bears, MD;  Location: [place] [facility];  Service: Endoscopy;  Laterality: N/A;  . EYE SURGERY Bilateral  removed cataracts  . GASTRIC VARICES BANDING N/A 08/20/2019   Procedure: GASTRIC VARICES BANDING;  Surgeon: Jackquline Denmark, MD;  Location: [place];  Service: Gastroenterology;  Laterality: N/A;  . HOT HEMOSTASIS N/A 01/16/2020   Procedure: HOT HEMOSTASIS (ARGON PLASMA COAGULATION/BICAP);  Surgeon: Jerene Bears, MD;  Location: [facility];  Service: Endoscopy;  Laterality: N/A;  . IR PARACENTESIS  08/01/2019  . IR PARACENTESIS  08/19/2019  . IR PARACENTESIS  09/09/2019  . IR PARACENTESIS  09/23/2019  . IR PARACENTESIS  10/08/2019  . IR PARACENTESIS  10/23/2019  . IR PARACENTESIS  11/12/2019  . IR PARACENTESIS  11/21/2019  . IR PARACENTESIS  11/25/2019  . IR PARACENTESIS  12/05/2019  . IR PARACENTESIS  12/13/2019  . IR PARACENTESIS  12/20/2019  . IR PARACENTESIS  12/26/2019  . IR PARACENTESIS  12/31/2019  . IR PARACENTESIS  01/07/2020  . IR  PARACENTESIS  01/10/2020  . IR PARACENTESIS  01/13/2020  . IR PARACENTESIS  01/14/2020  . IR PARACENTESIS  01/20/2020  . IR PARACENTESIS  01/30/2020  . IR PARACENTESIS  02/24/2020  . IR RADIOLOGIST EVAL & MGMT  10/30/2019  . IR RADIOLOGIST EVAL & MGMT  12/11/2019  . IR RADIOLOGIST EVAL & MGMT  12/24/2019  . IR RADIOLOGIST EVAL & MGMT  03/05/2020  . IR RADIOLOGIST EVAL & MGMT  05/15/2020  . IR TIPS  11/25/2019  . IR TIPS  01/14/2020  . KNEE ARTHROSCOPY Right 12/10/2015   Procedure: RIGHT KNEE ARTHROSCOPY WITH PARTIAL MEDIAL MENISCECTOMY;  Surgeon: Mcarthur Rossetti, MD;  Location: [facility];  Service: Orthopedics;  Laterality: Right;  . KNEE ARTHROSCOPY W/ MENISCAL REPAIR Bilateral   . MENISCUS REPAIR Left   . RADIOLOGY WITH ANESTHESIA N/A 11/25/2019   Procedure: TIPS;  Surgeon: Corrie Mckusick, DO;  Location: [place];  Service: Anesthesiology;  Laterality: N/A;  . RADIOLOGY WITH ANESTHESIA N/A 01/14/2020   Procedure: TIPS;  Surgeon: Corrie Mckusick, DO;  Location: [place];  Service: Anesthesiology;  Laterality: N/A;    Immunization History  Administered Date(s) Administered  . DTaP 03/16/1963, 04/15/1963, 05/16/1963, 01/02/1965, 08/05/1968  . Hep A / Hep B 10/16/2019, 11/15/2019, 04/30/2020  . Hepatitis B 10/27/2003  . IPV 03/16/1963, 05/16/1963, 05/05/1978  . Influenza,inj,Quad PF,6+ Mos 02/02/2016, 01/01/2019, 01/18/2020  . Influenza-Unspecified 02/02/2016, 01/30/2017, 01/11/2018  . MMR 08/15/2003  . Measles / Rubella 03/18/1970  . PFIZER(Purple Top)SARS-COV-2 Vaccination 05/17/2019, 06/07/2019, 05/26/2020  . Pneumococcal Polysaccharide-23 08/09/2016  . Smallpox 03/07/1969  . Tdap 08/09/2016  . Tetanus 08/04/2003  . Zoster Recombinat (Shingrix) 09/25/2018, 12/26/2018    Recent Results (from the past 2160 hour(s))  Basic metabolic panel     Status: Abnormal   Collection Time: 04/06/20  4:14 PM  Result Value Ref Range   Sodium 138 135 - 145 mEq/L   Potassium 3.1 (L) 3.5 - 5.1 mEq/L    Chloride 106 96 - 112 mEq/L   CO2 24 19 - 32 mEq/L   Glucose, Bld 145 (H) 70 - 99 mg/dL   BUN 11 6 - 23 mg/dL   Creatinine, Ser 0.89 0.40 - 1.20 mg/dL   GFR 72.02 >60.00 mL/min    Comment: Calculated using the CKD-EPI Creatinine Equation (2021)   Calcium 7.7 (L) 8.4 - 10.5 mg/dL  Basic metabolic panel     Status: Abnormal   Collection Time: 04/17/20  1:06 PM  Result Value Ref Range   Sodium 137 135 - 145 mEq/L   Potassium 4.0 3.5 - 5.1 mEq/L   Chloride 109  96 - 112 mEq/L   CO2 23 19 - 32 mEq/L   Glucose, Bld 188 (H) 70 - 99 mg/dL   BUN 12 6 - 23 mg/dL   Creatinine, Ser 0.93 0.40 - 1.20 mg/dL   GFR 68.30 >60.00 mL/min    Comment: Calculated using the CKD-EPI Creatinine Equation (2021)   Calcium 7.8 (L) 8.4 - 10.5 mg/dL  POCT glycosylated hemoglobin (Hb A1C)     Status: None   Collection Time: 04/29/20  2:20 PM  Result Value Ref Range   Hemoglobin A1C 4.7 4.0 - 5.6 %   HbA1c POC (<> result, manual entry)     HbA1c, POC (prediabetic range)     HbA1c, POC (controlled diabetic range)    Comprehensive metabolic panel     Status: Abnormal   Collection Time: 04/30/20  9:37 AM  Result Value Ref Range   Sodium 139 135 - 145 mEq/L   Potassium 3.0 (L) 3.5 - 5.1 mEq/L   Chloride 108 96 - 112 mEq/L   CO2 25 19 - 32 mEq/L   Glucose, Bld 147 (H) 70 - 99 mg/dL   BUN 11 6 - 23 mg/dL   Creatinine, Ser 0.87 0.40 - 1.20 mg/dL   Total Bilirubin 3.6 (H) 0.2 - 1.2 mg/dL   Alkaline Phosphatase 97 39 - 117 U/L   AST 32 0 - 37 U/L   ALT 13 0 - 35 U/L   Total Protein 5.3 (L) 6.0 - 8.3 g/dL   Albumin 2.3 (L) 3.5 - 5.2 g/dL   GFR 73.97 >60.00 mL/min    Comment: Calculated using the CKD-EPI Creatinine Equation (2021)   Calcium 7.6 (L) 8.4 - 10.5 mg/dL  Lipid panel     Status: Abnormal   Collection Time: 04/30/20  9:37 AM  Result Value Ref Range   Cholesterol 121 0 - 200 mg/dL    Comment: ATP III Classification       Desirable:  < 200 mg/dL               Borderline High:  200 - 239 mg/dL           High:  > = 240 mg/dL   Triglycerides 56.0 0.0 - 149.0 mg/dL    Comment: Normal:  <150 mg/dLBorderline High:  150 - 199 mg/dL   HDL 38.30 (L) >39.00 mg/dL   VLDL 11.2 0.0 - 40.0 mg/dL   LDL Cholesterol 71 0 - 99 mg/dL   Total CHOL/HDL Ratio 3     Comment:                Men          Women1/2 Average Risk     3.4          3.3Average Risk          5.0          4.42X Average Risk          9.6          7.13X Average Risk          15.0          11.0                       NonHDL 82.53     Comment: NOTE:  Non-HDL goal should be 30 mg/dL higher than patient's LDL goal (i.e. LDL goal of < 70 mg/dL, would have non-HDL goal of < 100 mg/dL)  TSH  Status: None   Collection Time: 04/30/20  9:37 AM  Result Value Ref Range   TSH 4.40 0.35 - 4.50 uIU/mL  AFP tumor marker     Status: None   Collection Time: 04/30/20  9:37 AM  Result Value Ref Range   AFP-Tumor Marker 2.7 ng/mL    Comment: Reference Range:   <6.1 The use of AFP as a tumor marker in pregnant females is not recommended. . . This test was performed using the Beckman Coulter chemiluminescent method. Values obtained from different assay methods cannot be used interchangeably. AFP levels, regardless of value, should not be interpreted as absolute evidence of the presence or absence of disease. Marland Kitchen   CBC with Differential/Platelet     Status: Abnormal   Collection Time: 04/30/20  9:37 AM  Result Value Ref Range   WBC 3.7 (L) 4.0 - 10.5 K/uL   RBC 2.80 (L) 3.87 - 5.11 Mil/uL   Hemoglobin 9.1 (L) 12.0 - 15.0 g/dL   HCT 25.6 Repeated and verified X2. (L) 36.0 - 46.0 %   MCV 91.4 78.0 - 100.0 fl   MCHC 35.5 30.0 - 36.0 g/dL   RDW 15.2 11.5 - 15.5 %   Platelets 110.0 (L) 150.0 - 400.0 K/uL   Neutrophils Relative % 63.4 43.0 - 77.0 %   Lymphocytes Relative 18.0 12.0 - 46.0 %   Monocytes Relative 8.5 3.0 - 12.0 %   Eosinophils Relative 8.6 (H) 0.0 - 5.0 %   Basophils Relative 1.5 0.0 - 3.0 %   Neutro Abs 2.4 1.4 - 7.7 K/uL   Lymphs  Abs 0.7 0.7 - 4.0 K/uL   Monocytes Absolute 0.3 0.1 - 1.0 K/uL   Eosinophils Absolute 0.3 0.0 - 0.7 K/uL   Basophils Absolute 0.1 0.0 - 0.1 K/uL  Basic metabolic panel     Status: Abnormal   Collection Time: 05/13/20  3:19 PM  Result Value Ref Range   Sodium 135 135 - 145 mEq/L   Potassium 4.0 3.5 - 5.1 mEq/L   Chloride 106 96 - 112 mEq/L   CO2 24 19 - 32 mEq/L   Glucose, Bld 206 (H) 70 - 99 mg/dL   BUN 11 6 - 23 mg/dL   Creatinine, Ser 0.97 0.40 - 1.20 mg/dL   GFR 64.90 >60.00 mL/min    Comment: Calculated using the CKD-EPI Creatinine Equation (2021)   Calcium 8.4 8.4 - 10.5 mg/dL  Basic metabolic panel     Status: Abnormal   Collection Time: 05/26/20  3:50 PM  Result Value Ref Range   Sodium 134 (L) 135 - 145 mEq/L   Potassium 3.9 3.5 - 5.1 mEq/L   Chloride 106 96 - 112 mEq/L   CO2 23 19 - 32 mEq/L   Glucose, Bld 242 (H) 70 - 99 mg/dL   BUN 13 6 - 23 mg/dL   Creatinine, Ser 1.05 0.40 - 1.20 mg/dL   GFR 59.00 (L) >60.00 mL/min    Comment: Calculated using the CKD-EPI Creatinine Equation (2021)   Calcium 8.8 8.4 - 10.5 mg/dL  Folate     Status: None   Collection Time: 06/25/20 10:45 AM  Result Value Ref Range   Folate >23.6 >5.9 ng/mL  Vitamin B12     Status: Abnormal   Collection Time: 06/25/20 10:45 AM  Result Value Ref Range   Vitamin B-12 981 (H) 211 - 911 pg/mL  IBC + Ferritin     Status: Abnormal   Collection Time: 06/25/20 10:45 AM  Result Value  Ref Range   Iron 56 42 - 145 ug/dL   Transferrin 215.0 212.0 - 360.0 mg/dL   Saturation Ratios 18.6 (L) 20.0 - 50.0 %   Ferritin 114.6 10.0 - 291.0 ng/mL  Protime-INR     Status: Abnormal   Collection Time: 06/25/20 10:45 AM  Result Value Ref Range   INR 1.3 (H) 0.8 - 1.0 ratio   Prothrombin Time 14.3 (H) 9.6 - 13.1 sec  Comprehensive metabolic panel     Status: Abnormal   Collection Time: 06/25/20 10:45 AM  Result Value Ref Range   Sodium 122 (L) 135 - 145 mEq/L   Potassium 4.5 3.5 - 5.1 mEq/L   Chloride 89 (L)  96 - 112 mEq/L   CO2 25 19 - 32 mEq/L   Glucose, Bld 704 (HH) 70 - 99 mg/dL   BUN 25 (H) 6 - 23 mg/dL   Creatinine, Ser 1.50 (H) 0.40 - 1.20 mg/dL   Total Bilirubin 3.8 (H) 0.2 - 1.2 mg/dL   Alkaline Phosphatase 136 (H) 39 - 117 U/L   AST 25 0 - 37 U/L   ALT 20 0 - 35 U/L   Total Protein 6.3 6.0 - 8.3 g/dL   Albumin 3.1 (L) 3.5 - 5.2 g/dL   GFR 38.44 (L) >60.00 mL/min    Comment: Calculated using the CKD-EPI Creatinine Equation (2021)   Calcium 9.6 8.4 - 10.5 mg/dL  CBC with Differential/Platelet     Status: Abnormal   Collection Time: 06/25/20 10:45 AM  Result Value Ref Range   WBC 4.8 4.0 - 10.5 K/uL   RBC 3.51 (L) 3.87 - 5.11 Mil/uL   Hemoglobin 10.9 (L) 12.0 - 15.0 g/dL   HCT 30.6 (L) 36.0 - 46.0 %   MCV 87.2 78.0 - 100.0 fl   MCHC 35.5 30.0 - 36.0 g/dL   RDW 14.7 11.5 - 15.5 %   Platelets 109.0 (L) 150.0 - 400.0 K/uL   Neutrophils Relative % 67.2 43.0 - 77.0 %   Lymphocytes Relative 15.9 12.0 - 46.0 %   Monocytes Relative 7.3 3.0 - 12.0 %   Eosinophils Relative 8.7 (H) 0.0 - 5.0 %   Basophils Relative 0.9 0.0 - 3.0 %   Neutro Abs 3.3 1.4 - 7.7 K/uL   Lymphs Abs 0.8 0.7 - 4.0 K/uL   Monocytes Absolute 0.4 0.1 - 1.0 K/uL   Eosinophils Absolute 0.4 0.0 - 0.7 K/uL   Basophils Absolute 0.0 0.0 - 0.1 K/uL  POCT glucose (manual entry)     Status: Abnormal   Collection Time: 06/26/20  1:37 PM  Result Value Ref Range   POC Glucose 436 (A) 70 - 99 mg/dl  POCT URINALYSIS DIP (CLINITEK)     Status: Abnormal   Collection Time: 06/26/20  1:37 PM  Result Value Ref Range   Color, UA yellow yellow   Clarity, UA clear clear   Glucose, UA >=1,000 (A) negative mg/dL   Bilirubin, UA negative negative   Ketones, POC UA negative negative mg/dL   Spec Grav, UA 1.020 1.010 - 1.025   Blood, UA trace-lysed (A) negative   pH, UA 6.0 5.0 - 8.0   POC PROTEIN,UA =30 (A) negative, trace   Urobilinogen, UA 2.0 (A) 0.2 or 1.0 E.U./dL   Nitrite, UA Negative Negative   Leukocytes, UA Negative  Negative  POCT UA - Microalbumin     Status: Abnormal   Collection Time: 06/26/20  1:38 PM  Result Value Ref Range   Microalbumin Ur, POC 80  mg/L   Creatinine, POC 100 mg/dL   Albumin/Creatinine Ratio, Urine, POC 30-300   Urine Culture     Status: None   Collection Time: 06/26/20  1:39 PM   Specimen: Urine  Result Value Ref Range   MICRO NUMBER: [id number]    SPECIMEN QUALITY: Adequate    Sample Source NOT GIVEN    STATUS: FINAL    Result:      Mixed genital flora isolated. These superficial bacteria are not indicative of a urinary tract infection. No further organism identification is warranted on this specimen. If clinically indicated, recollect clean-catch, mid-stream urine and transfer  immediately to Urine Culture Transport Tube.     No results found.     All questions at time of visit were answered - patient instructed to contact office with any additional concerns or updates. ER/RTC precautions were reviewed with the patient as applicable.   Please note: manual typing as well as voice recognition software may have been used to produce this document - typos may escape review. Please contact Dr. Sheppard Coil for any needed clarifications.

## 2020-06-29 NOTE — Patient Instructions (Signed)
Plan:  Increase Touejo by 2 units every day to fasting sugar around 150 or so then stay at that number of units.   I sent Rx for Tresiba insulin (similar to Toujeo but better covered by your insurance) for when you use up the [employer]. Use the same number of units of insulin.   Will recheck labs today to make sure everything going back in the right direction, especially kidney function.

## 2020-06-30 MED FILL — ULTICARE PEN NDL 4MM 32G: 32G X 4 MM | 90 days supply | Qty: 100 | Fill #0

## 2020-06-30 MED FILL — AMILORIDE HCL 5 MG TABS: 5 | 30 days supply | Qty: 180 | Fill #2

## 2020-07-01 ENCOUNTER — Other Ambulatory Visit (INDEPENDENT_AMBULATORY_CARE_PROVIDER_SITE_OTHER): Payer: No Typology Code available for payment source

## 2020-07-01 DIAGNOSIS — D509 Iron deficiency anemia, unspecified: Secondary | ICD-10-CM | POA: Diagnosis not present

## 2020-07-01 DIAGNOSIS — K7290 Hepatic failure, unspecified without coma: Secondary | ICD-10-CM

## 2020-07-01 DIAGNOSIS — K7682 Hepatic encephalopathy: Secondary | ICD-10-CM

## 2020-07-01 DIAGNOSIS — R188 Other ascites: Secondary | ICD-10-CM

## 2020-07-01 DIAGNOSIS — K7460 Unspecified cirrhosis of liver: Secondary | ICD-10-CM

## 2020-07-01 LAB — BASIC METABOLIC PANEL
BUN: 23 mg/dL (ref 6–23)
CO2: 27 mEq/L (ref 19–32)
Calcium: 9.1 mg/dL (ref 8.4–10.5)
Chloride: 94 mEq/L — ABNORMAL LOW (ref 96–112)
Creatinine, Ser: 1.4 mg/dL — ABNORMAL HIGH (ref 0.40–1.20)
GFR: 41.75 mL/min — ABNORMAL LOW (ref >60.00)
Glucose, Bld: 600 mg/dL (ref 70–99)
Potassium: 4.2 mEq/L (ref 3.5–5.1)
Sodium: 125 mEq/L — ABNORMAL LOW (ref 135–145)

## 2020-07-02 ENCOUNTER — Other Ambulatory Visit: Payer: Self-pay

## 2020-07-02 DIAGNOSIS — K7682 Hepatic encephalopathy: Secondary | ICD-10-CM

## 2020-07-02 DIAGNOSIS — K7290 Hepatic failure, unspecified without coma: Secondary | ICD-10-CM

## 2020-07-02 DIAGNOSIS — D509 Iron deficiency anemia, unspecified: Secondary | ICD-10-CM

## 2020-07-02 DIAGNOSIS — K7460 Unspecified cirrhosis of liver: Secondary | ICD-10-CM

## 2020-07-02 DIAGNOSIS — R188 Other ascites: Secondary | ICD-10-CM

## 2020-07-06 ENCOUNTER — Other Ambulatory Visit: Payer: Self-pay | Admitting: Gastroenterology

## 2020-07-06 NOTE — Telephone Encounter (Signed)
Ok to refill Potassium 20 MEQ bid? If so, please advise on # and refills. thanks

## 2020-07-06 NOTE — Telephone Encounter (Signed)
Thanks [name]. Can you verify if she is actually taking the potassium twice daily or is the pharmacy reflexively asking for this? If she is taking it, can refill for 3 months with one refill. Thanks

## 2020-07-07 ENCOUNTER — Other Ambulatory Visit: Payer: Self-pay | Admitting: Gastroenterology

## 2020-07-07 MED FILL — POTASSIUM CL ER 20 MEQ TAB: 20 | 30 days supply | Qty: 60 | Fill #0

## 2020-07-07 NOTE — Telephone Encounter (Signed)
[name] can you please ask the patient to go to the lab tomorrow and we will discuss timing of reinitiation of her diuretics and recheck her K. Thank you!

## 2020-07-07 NOTE — Telephone Encounter (Signed)
Message sent to patient via [place].

## 2020-07-08 ENCOUNTER — Other Ambulatory Visit (INDEPENDENT_AMBULATORY_CARE_PROVIDER_SITE_OTHER): Payer: No Typology Code available for payment source

## 2020-07-08 DIAGNOSIS — K7682 Hepatic encephalopathy: Secondary | ICD-10-CM

## 2020-07-08 DIAGNOSIS — R188 Other ascites: Secondary | ICD-10-CM | POA: Diagnosis not present

## 2020-07-08 DIAGNOSIS — K7460 Unspecified cirrhosis of liver: Secondary | ICD-10-CM | POA: Diagnosis not present

## 2020-07-08 DIAGNOSIS — K7290 Hepatic failure, unspecified without coma: Secondary | ICD-10-CM | POA: Diagnosis not present

## 2020-07-08 DIAGNOSIS — D509 Iron deficiency anemia, unspecified: Secondary | ICD-10-CM | POA: Diagnosis not present

## 2020-07-08 LAB — BASIC METABOLIC PANEL
BUN: 12 mg/dL (ref 6–23)
CO2: 25 mEq/L (ref 19–32)
Calcium: 9.6 mg/dL (ref 8.4–10.5)
Chloride: 100 mEq/L (ref 96–112)
Creatinine, Ser: 1.21 mg/dL — ABNORMAL HIGH (ref 0.40–1.20)
GFR: 49.73 mL/min — ABNORMAL LOW (ref >60.00)
Glucose, Bld: 460 mg/dL — ABNORMAL HIGH (ref 70–99)
Potassium: 4.6 mEq/L (ref 3.5–5.1)
Sodium: 130 mEq/L — ABNORMAL LOW (ref 135–145)

## 2020-07-09 ENCOUNTER — Telehealth (INDEPENDENT_AMBULATORY_CARE_PROVIDER_SITE_OTHER): Payer: No Typology Code available for payment source | Admitting: Osteopathic Medicine

## 2020-07-09 ENCOUNTER — Encounter: Payer: Self-pay | Admitting: Osteopathic Medicine

## 2020-07-09 ENCOUNTER — Other Ambulatory Visit: Payer: Self-pay | Admitting: Osteopathic Medicine

## 2020-07-09 ENCOUNTER — Telehealth: Payer: Self-pay

## 2020-07-09 VITALS — Wt 149.6 lb

## 2020-07-09 DIAGNOSIS — E119 Type 2 diabetes mellitus without complications: Secondary | ICD-10-CM

## 2020-07-09 DIAGNOSIS — Z794 Long term (current) use of insulin: Secondary | ICD-10-CM | POA: Diagnosis not present

## 2020-07-09 MED ORDER — HUMALOG KWIKPEN 200 UNIT/ML ~~LOC~~ SOPN: 10.0000 [IU] | PEN_INJECTOR | Freq: Three times a day (TID) | SUBCUTANEOUS | 1 refills | Status: DC

## 2020-07-09 MED FILL — HUMALOG 200 UNITS/ML KWIKPE: 200 | 67 days supply | Qty: 15 | Fill #0

## 2020-07-09 NOTE — Telephone Encounter (Signed)
Yevette Edwards, RN  Yevette Edwards, RN Repeat BMET, order in epic.   Patient had labs drawn on 07/08/20.

## 2020-07-09 NOTE — Progress Notes (Signed)
Telemedicine Visit via  Audio only - telephone (patient preference /  technical difficulty with MyChart video application)  I connected with Meagan Peck on 07/09/20 at 2:32 PM  by phone or  telemedicine application as noted above  I verified that I am speaking with or regarding  the correct patient using two identifiers.  Participants: Myself, Dr [name] DO Patient: Meagan Peck Patient proxy if applicable: none Other, if applicable: none  Patient is at home I am in office at [facility]    I discussed the limitations of evaluation and management  by telemedicine and the availability of in person appointments.  The participant(s) above expressed understanding and  agreed to proceed with this appointment via telemedicine.       History of Present Illness: Meagan Peck is a 60 y.o. 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     We increased Toujeo by 2 units daily to fasting Glc around 150 - quick titration up given severity of hyperglycemia and relative short interval from normoglycemia to hyperglycemia w/ symptoms, vs years to acclimate to hyperglycemia. Pt is at 28 units, fasting Glc still 400's.      Observations/Objective: Wt 149 lb 9.6 oz (67.9 kg)   LMP 12/03/2013 (LMP Unknown)   BMI 31.54 kg/m  BP Readings from Last 3 Encounters:  06/29/20 (!) 146/74  06/26/20 113/67  06/25/20 (!) 108/40   Exam: Normal Speech.  NAD  Lab and Radiology Results Results for orders placed or performed in visit on 07/08/20 (from the past 72 hour(s))  Basic metabolic panel     Status: Abnormal   Collection Time: 07/08/20  3:23 PM  Result Value Ref Range   Sodium 130 (L) 135 - 145 mEq/L   Potassium 4.6 3.5 - 5.1 mEq/L   Chloride 100 96 - 112 mEq/L   CO2 25 19 - 32 mEq/L   Glucose, Bld 460 (H) 70 - 99 mg/dL   BUN 12 6 - 23 mg/dL   Creatinine, Ser 1.21 (H) 0.40 - 1.20  mg/dL   GFR 49.73 (L) >60.00 mL/min    Comment: Calculated using the CKD-EPI Creatinine Equation (2021)   Calcium 9.6 8.4 - 10.5 mg/dL   No results found.     Assessment and Plan: 60 y.o. female with The encounter diagnosis was Type 2 diabetes mellitus without complication, with long-term current use of insulin ([place]).   PDMP not reviewed this encounter. No orders of the defined types were placed in this encounter.  Meds ordered this encounter  Medications  . insulin lispro (HUMALOG KWIKPEN) 200 UNIT/ML KwikPen    Sig: Inject 10-15 Units into the skin 3 (three) times daily before meals.    Dispense:  15 mL    Refill:  1   Patient Instructions  Increase Toujeo / Tresiba by 4 units daily to fasting glucose goal <200, or even better to 150 or under.   Start Humalog at mealtimes, 10 units at small/medium meals, 15 units at larger meals.   Call the transplant folks! If there's anything they need from [place], let me know! ([facility] might need a letter from me - I'll finish that today after clinic, and it will be visible to you on MyChart!)      Instructions sent via [employer].   Follow Up Instructions: Return in about 1 week (around 07/16/2020) for phone visit follow up sugars .    I discussed the  assessment and treatment plan with the patient. The patient was provided an opportunity to ask questions and all were answered. The patient agreed with the plan and demonstrated an understanding of the instructions.   The patient was advised to call back or seek an in-person evaluation if any new concerns, if symptoms worsen or if the condition fails to improve as anticipated.  21 minutes of non-face-to-face time was provided during this encounter.      . . . . . . . . . . . . . Marland Kitchen                   Historical information moved to improve visibility of documentation.  Past Medical History:  Diagnosis Date  . Acute medial meniscus tear of right knee  12/10/2015  . Allergy   . Anasarca   . Arthritis    bil feet  . Ascites   . Cataract    bilateral - surgery to remove  . Cirrhosis ([place])   . Diabetes mellitus without complication ([place])    type 2 - last a1c was 5.2 per patient, no meds  . Dyspnea    when I have too much fluid  . Esophageal varices ([place])   . GERD (gastroesophageal reflux disease)   . Heart murmur   . Hepatic cirrhosis ([place])   . HLD (hyperlipidemia)    no meds  . Hypertension    no meds  . Iron deficiency anemia 09/26/2018   Past Surgical History:  Procedure Laterality Date  . BIOPSY  08/20/2019   Procedure: BIOPSY;  Surgeon: Jackquline Denmark, MD;  Location: [place];  Service: Gastroenterology;;  . CARPAL TUNNEL RELEASE Right   . CESAREAN SECTION    . CHOLECYSTECTOMY    . COLONOSCOPY  08/2019  . ESOPHAGEAL BANDING N/A 09/19/2019   Procedure: ESOPHAGEAL BANDING;  Surgeon: Yetta Flock, MD;  Location: [facility];  Service: Gastroenterology;  Laterality: N/A;  . ESOPHAGOGASTRODUODENOSCOPY (EGD) WITH PROPOFOL N/A 08/20/2019   Procedure: ESOPHAGOGASTRODUODENOSCOPY (EGD) WITH PROPOFOL;  Surgeon: Jackquline Denmark, MD;  Location: [place];  Service: Gastroenterology;  Laterality: N/A;  . ESOPHAGOGASTRODUODENOSCOPY (EGD) WITH PROPOFOL N/A 09/19/2019   Procedure: ESOPHAGOGASTRODUODENOSCOPY (EGD) WITH PROPOFOL;  Surgeon: Yetta Flock, MD;  [facility]: [facility];  Service: Gastroenterology;  Laterality: N/A;  . ESOPHAGOGASTRODUODENOSCOPY (EGD) WITH PROPOFOL N/A 01/16/2020   Procedure: ESOPHAGOGASTRODUODENOSCOPY (EGD) WITH PROPOFOL;  Surgeon: Jerene Bears, MD;  Location: [facility];  Service: Endoscopy;  Laterality: N/A;  . EYE SURGERY Bilateral    removed cataracts  . GASTRIC VARICES BANDING N/A 08/20/2019   Procedure: GASTRIC VARICES BANDING;  Surgeon: Jackquline Denmark, MD;  Location: [place];  Service: Gastroenterology;  Laterality: N/A;  . HOT HEMOSTASIS N/A 01/16/2020   Procedure: HOT HEMOSTASIS  (ARGON PLASMA COAGULATION/BICAP);  Surgeon: Jerene Bears, MD;  Location[facility];  Service: Endoscopy;  Laterality: N/A;  . IR PARACENTESIS  08/01/2019  . IR PARACENTESIS  08/19/2019  . IR PARACENTESIS  09/09/2019  . IR PARACENTESIS  09/23/2019  . IR PARACENTESIS  10/08/2019  . IR PARACENTESIS  10/23/2019  . IR PARACENTESIS  11/12/2019  . IR PARACENTESIS  11/21/2019  . IR PARACENTESIS  11/25/2019  . IR PARACENTESIS  12/05/2019  . IR PARACENTESIS  12/13/2019  . IR PARACENTESIS  12/20/2019  . IR PARACENTESIS  12/26/2019  . IR PARACENTESIS  12/31/2019  . IR PARACENTESIS  01/07/2020  . IR PARACENTESIS  01/10/2020  . IR PARACENTESIS  01/13/2020  . IR PARACENTESIS  01/14/2020  . IR PARACENTESIS  01/20/2020  . IR PARACENTESIS  01/30/2020  . IR PARACENTESIS  02/24/2020  . IR RADIOLOGIST EVAL & MGMT  10/30/2019  . IR RADIOLOGIST EVAL & MGMT  12/11/2019  . IR RADIOLOGIST EVAL & MGMT  12/24/2019  . IR RADIOLOGIST EVAL & MGMT  03/05/2020  . IR RADIOLOGIST EVAL & MGMT  05/15/2020  . IR TIPS  11/25/2019  . IR TIPS  01/14/2020  . KNEE ARTHROSCOPY Right 12/10/2015   Procedure: RIGHT KNEE ARTHROSCOPY WITH PARTIAL MEDIAL MENISCECTOMY;  Surgeon: Mcarthur Rossetti, MD;  Location: [facility];  Service: Orthopedics;  Laterality: Right;  . KNEE ARTHROSCOPY W/ MENISCAL REPAIR Bilateral   . MENISCUS REPAIR Left   . RADIOLOGY WITH ANESTHESIA N/A 11/25/2019   Procedure: TIPS;  Surgeon: Corrie Mckusick, DO;  Location: [place];  Service: Anesthesiology;  Laterality: N/A;  . RADIOLOGY WITH ANESTHESIA N/A 01/14/2020   Procedure: TIPS;  Surgeon: Corrie Mckusick, DO;  Location: [place];  Service: Anesthesiology;  Laterality: N/A;   Social History   Tobacco Use  . Smoking status: Never Smoker  . Smokeless tobacco: Never Used  Substance Use Topics  . Alcohol use: No   family history includes Alcoholism in her father, maternal uncle, and paternal uncle; COPD in her mother; Diabetes in her brother and paternal grandmother; Heart attack in  her father; Heart disease in her maternal grandfather, maternal grandmother, paternal grandfather, and paternal grandmother; Hypertension in her mother.  Medications: Current Outpatient Medications  Medication Sig Dispense Refill  . ACCU-CHEK FASTCLIX LANCETS MISC Check fsbs TID    . AMBULATORY NON FORMULARY MEDICATION Wedge pillow 1 each 0  . aMILoride (MIDAMOR) 5 MG tablet Take 4 tablets in the morning and 2 tablets in the evening (Patient taking differently: Take 4 tablets in the morning and 4 tablets in the evening) 180 tablet 2  . cetirizine (ZYRTEC) 10 MG tablet Take 1 tablet (10 mg total) by mouth daily. (Patient taking differently: Take 10 mg by mouth daily as needed for allergies.) 90 tablet 3  . Cholecalciferol (VITAMIN D3) 5000 UNITS TABS Take 5,000 Units by mouth daily.     . ciprofloxacin (CIPRO) 500 MG tablet TAKE 1 TABLET (500 MG TOTAL) BY MOUTH DAILY. 30 tablet 2  . ferrous sulfate 325 (65 FE) MG EC tablet Take 1 tablet (325 mg total) by mouth 2 (two) times daily with a meal. 180 tablet 1  . furosemide (LASIX) 20 MG tablet Take 40 mg (2 tablets) by mouth every morning and 20 mg (1 tablet) by mouth at bedtime (Patient taking differently: Take 40 mg (2 tablets) by mouth every morning and 40 mg (2 tablet) by mouth at bedtime) 90 tablet 1  . insulin degludec (TRESIBA FLEXTOUCH) 200 UNIT/ML FlexTouch Pen Inject 10-50 Units into the skin daily. Target to fasting Glc 150 15 mL 99  . insulin lispro (HUMALOG KWIKPEN) 200 UNIT/ML KwikPen Inject 10-15 Units into the skin 3 (three) times daily before meals. 15 mL 1  . Krill Oil 500 MG CAPS Take 1,000 mg by mouth 2 (two) times daily.    Marland Kitchen lactulose (CHRONULAC) 10 GM/15ML solution Take 15 mLs (10 g total) by mouth daily. Please keep your appt with Dr. Havery Moros on Thursday, 04-30-20. 473 mL 0  . Magnesium Oxide 400 (240 Mg) MG TABS TAKE 1 TABLET (400 MG TOTAL) BY MOUTH 2 (TWO) TIMES DAILY. 120 tablet 1  . pantoprazole (PROTONIX) 40 MG tablet  Take 1 tablet (40 mg total)  by mouth daily. 90 tablet 3  . Potassium Chloride ER 20 MEQ TBCR TAKE 1 TABLET BY MOUTH TWICE DAILY 180 tablet 1  . potassium chloride SA (KLOR-CON) 20 MEQ tablet Take 20 mEq by mouth 2 (two) times daily.     Current Facility-Administered Medications  Medication Dose Route Frequency Provider Last Rate Last Admin  . albumin human 25 % solution 25 g  25 g Intravenous Once Levin Erp, [place]       Allergies  Allergen Reactions  . Penicillins Anaphylaxis    Happened as a young child  . Atrovent [Ipratropium] Palpitations    As per patient  . Naproxen Rash  . Quinine Derivatives Rash  . Sulfa Antibiotics Rash     If phone visit, billing and coding can please add appropriate modifier if needed

## 2020-07-09 NOTE — Patient Instructions (Addendum)
Increase Toujeo / Tyler Aas by 4 units daily to fasting glucose goal <200, or even better to 150 or under.   Start Humalog at mealtimes, 10 units at small/medium meals, 15 units at larger meals.   Call the transplant folks! If there's anything they need from [place], let me know! ([facility] might need a letter from me - I'll finish that today after clinic, and it will be visible to you on MyChart!)

## 2020-07-10 ENCOUNTER — Other Ambulatory Visit: Payer: Self-pay

## 2020-07-10 DIAGNOSIS — R188 Other ascites: Secondary | ICD-10-CM

## 2020-07-10 DIAGNOSIS — K7460 Unspecified cirrhosis of liver: Secondary | ICD-10-CM

## 2020-07-15 ENCOUNTER — Other Ambulatory Visit (INDEPENDENT_AMBULATORY_CARE_PROVIDER_SITE_OTHER): Payer: No Typology Code available for payment source

## 2020-07-15 ENCOUNTER — Other Ambulatory Visit: Payer: Self-pay

## 2020-07-15 DIAGNOSIS — R188 Other ascites: Secondary | ICD-10-CM

## 2020-07-15 DIAGNOSIS — K7460 Unspecified cirrhosis of liver: Secondary | ICD-10-CM | POA: Diagnosis not present

## 2020-07-15 LAB — BASIC METABOLIC PANEL
BUN: 14 mg/dL (ref 6–23)
CO2: 24 mEq/L (ref 19–32)
Calcium: 8.6 mg/dL (ref 8.4–10.5)
Chloride: 107 mEq/L (ref 96–112)
Creatinine, Ser: 1.05 mg/dL (ref 0.40–1.20)
GFR: 58.94 mL/min — ABNORMAL LOW (ref >60.00)
Glucose, Bld: 264 mg/dL — ABNORMAL HIGH (ref 70–99)
Potassium: 4 mEq/L (ref 3.5–5.1)
Sodium: 135 mEq/L (ref 135–145)

## 2020-07-17 ENCOUNTER — Other Ambulatory Visit: Payer: Self-pay

## 2020-07-17 ENCOUNTER — Ambulatory Visit (HOSPITAL_COMMUNITY)
Admission: RE | Admit: 2020-07-17 | Discharge: 2020-07-17 | Disposition: A | Discharge status: Home / Self Care | Payer: No Typology Code available for payment source | Source: Ambulatory Visit | Attending: Gastroenterology | Admitting: Gastroenterology

## 2020-07-17 DIAGNOSIS — K7682 Hepatic encephalopathy: Secondary | ICD-10-CM

## 2020-07-17 DIAGNOSIS — R188 Other ascites: Secondary | ICD-10-CM | POA: Diagnosis present

## 2020-07-17 DIAGNOSIS — J90 Pleural effusion, not elsewhere classified: Secondary | ICD-10-CM | POA: Insufficient documentation

## 2020-07-17 DIAGNOSIS — K7460 Unspecified cirrhosis of liver: Secondary | ICD-10-CM | POA: Insufficient documentation

## 2020-07-17 DIAGNOSIS — K7290 Hepatic failure, unspecified without coma: Secondary | ICD-10-CM | POA: Insufficient documentation

## 2020-07-17 DIAGNOSIS — I8510 Secondary esophageal varices without bleeding: Secondary | ICD-10-CM | POA: Diagnosis present

## 2020-07-17 DIAGNOSIS — R601 Generalized edema: Secondary | ICD-10-CM | POA: Insufficient documentation

## 2020-07-17 DIAGNOSIS — D649 Anemia, unspecified: Secondary | ICD-10-CM | POA: Insufficient documentation

## 2020-07-22 ENCOUNTER — Telehealth: Payer: Self-pay

## 2020-07-22 ENCOUNTER — Other Ambulatory Visit (INDEPENDENT_AMBULATORY_CARE_PROVIDER_SITE_OTHER): Payer: No Typology Code available for payment source

## 2020-07-22 DIAGNOSIS — R188 Other ascites: Secondary | ICD-10-CM

## 2020-07-22 DIAGNOSIS — K7460 Unspecified cirrhosis of liver: Secondary | ICD-10-CM

## 2020-07-22 LAB — BASIC METABOLIC PANEL
BUN: 12 mg/dL (ref 6–23)
CO2: 23 mEq/L (ref 19–32)
Calcium: 8.8 mg/dL (ref 8.4–10.5)
Chloride: 109 mEq/L (ref 96–112)
Creatinine, Ser: 1.18 mg/dL (ref 0.40–1.20)
GFR: 51.23 mL/min — ABNORMAL LOW (ref >60.00)
Glucose, Bld: 107 mg/dL — ABNORMAL HIGH (ref 70–99)
Potassium: 4 mEq/L (ref 3.5–5.1)
Sodium: 139 mEq/L (ref 135–145)

## 2020-07-22 NOTE — Telephone Encounter (Signed)
-----   Message from Yevette Edwards, RN sent at 07/15/2020  3:55 PM EDT ----- Regarding: Labs Repeat BMET, order in epic

## 2020-07-22 NOTE — Telephone Encounter (Signed)
Left detailed message to remind her that she is due for repeat labs at this time. Advised that no appt is necessary, she can stop by at her convenience today between 7:30 AM - 5 PM. Advised patient to give [place] a call back or send a My Chart message  if she has any questions or concerns.

## 2020-07-23 ENCOUNTER — Other Ambulatory Visit: Payer: Self-pay

## 2020-07-23 DIAGNOSIS — K7460 Unspecified cirrhosis of liver: Secondary | ICD-10-CM

## 2020-07-23 DIAGNOSIS — R188 Other ascites: Secondary | ICD-10-CM

## 2020-07-27 MED FILL — CIPROFLOXACIN HCL 500 MG TA: 500 | 30 days supply | Qty: 30 | Fill #2

## 2020-08-01 ENCOUNTER — Other Ambulatory Visit (HOSPITAL_BASED_OUTPATIENT_CLINIC_OR_DEPARTMENT_OTHER): Payer: Self-pay

## 2020-08-11 ENCOUNTER — Other Ambulatory Visit (HOSPITAL_BASED_OUTPATIENT_CLINIC_OR_DEPARTMENT_OTHER): Payer: Self-pay

## 2020-08-11 MED FILL — Furosemide Tab 20 MG: ORAL | 30 days supply | Qty: 90 | Fill #0 | Status: AC

## 2020-08-13 ENCOUNTER — Telehealth: Payer: Self-pay

## 2020-08-13 NOTE — Telephone Encounter (Signed)
Left detailed voicemail reminding patient that she is due for repeat lab work at this time. Advised that the office is closed on 08/14/20 but she can stop by early next week at her convenience between 7:30 AM - 5 PM. Advised patient to give me a call back or send a My Chart message if she had any questions.

## 2020-08-13 NOTE — Telephone Encounter (Signed)
-----   Message from Yevette Edwards, RN sent at 07/23/2020  9:52 AM EDT ----- Regarding: Labs Repeat BMET, order in epic.

## 2020-08-28 ENCOUNTER — Ambulatory Visit: Payer: No Typology Code available for payment source | Admitting: Gastroenterology

## 2020-09-03 ENCOUNTER — Other Ambulatory Visit: Payer: Self-pay | Admitting: Physician Assistant

## 2020-09-03 ENCOUNTER — Other Ambulatory Visit: Payer: Self-pay | Admitting: Osteopathic Medicine

## 2020-09-03 ENCOUNTER — Other Ambulatory Visit (HOSPITAL_BASED_OUTPATIENT_CLINIC_OR_DEPARTMENT_OTHER): Payer: Self-pay

## 2020-09-03 ENCOUNTER — Other Ambulatory Visit: Payer: Self-pay | Admitting: Gastroenterology

## 2020-09-03 DIAGNOSIS — Z79899 Other long term (current) drug therapy: Secondary | ICD-10-CM

## 2020-09-03 DIAGNOSIS — E119 Type 2 diabetes mellitus without complications: Secondary | ICD-10-CM

## 2020-09-03 DIAGNOSIS — Z794 Long term (current) use of insulin: Secondary | ICD-10-CM

## 2020-09-03 MED ORDER — MAGNESIUM OXIDE -MG SUPPLEMENT 400 (240 MG) MG PO TABS
1.0000 | ORAL_TABLET | Freq: Two times a day (BID) | ORAL | 1 refills | Status: DC
Start: 2020-09-03 — End: 2021-02-08
  Filled 2020-09-03: qty 120, 60d supply, fill #0

## 2020-09-03 MED ORDER — AMILORIDE HCL 5 MG PO TABS
ORAL_TABLET | ORAL | 2 refills | Status: DC
  Filled 2020-09-03: qty 180, 30d supply, fill #0

## 2020-09-04 ENCOUNTER — Other Ambulatory Visit (HOSPITAL_BASED_OUTPATIENT_CLINIC_OR_DEPARTMENT_OTHER): Payer: Self-pay

## 2020-09-07 ENCOUNTER — Other Ambulatory Visit (HOSPITAL_BASED_OUTPATIENT_CLINIC_OR_DEPARTMENT_OTHER): Payer: Self-pay

## 2020-09-08 ENCOUNTER — Other Ambulatory Visit (HOSPITAL_BASED_OUTPATIENT_CLINIC_OR_DEPARTMENT_OTHER): Payer: Self-pay

## 2020-09-14 ENCOUNTER — Encounter (HOSPITAL_COMMUNITY): Payer: Self-pay

## 2020-09-14 ENCOUNTER — Inpatient Hospital Stay (HOSPITAL_COMMUNITY)
Admission: EM | Admit: 2020-09-14 | Discharge: 2020-09-19 | DRG: 313 | Disposition: A | Discharge status: Home / Self Care | Payer: Self-pay | Attending: Internal Medicine | Admitting: Internal Medicine

## 2020-09-14 ENCOUNTER — Emergency Department (HOSPITAL_COMMUNITY): Payer: Self-pay

## 2020-09-14 DIAGNOSIS — K439 Ventral hernia without obstruction or gangrene: Secondary | ICD-10-CM | POA: Diagnosis present

## 2020-09-14 DIAGNOSIS — K7460 Unspecified cirrhosis of liver: Secondary | ICD-10-CM | POA: Diagnosis present

## 2020-09-14 DIAGNOSIS — Z794 Long term (current) use of insulin: Secondary | ICD-10-CM

## 2020-09-14 DIAGNOSIS — D6959 Other secondary thrombocytopenia: Secondary | ICD-10-CM | POA: Diagnosis present

## 2020-09-14 DIAGNOSIS — Z792 Long term (current) use of antibiotics: Secondary | ICD-10-CM

## 2020-09-14 DIAGNOSIS — K219 Gastro-esophageal reflux disease without esophagitis: Secondary | ICD-10-CM | POA: Diagnosis present

## 2020-09-14 DIAGNOSIS — Z9049 Acquired absence of other specified parts of digestive tract: Secondary | ICD-10-CM

## 2020-09-14 DIAGNOSIS — Z20822 Contact with and (suspected) exposure to covid-19: Secondary | ICD-10-CM | POA: Diagnosis present

## 2020-09-14 DIAGNOSIS — I8500 Esophageal varices without bleeding: Secondary | ICD-10-CM | POA: Diagnosis present

## 2020-09-14 DIAGNOSIS — Z886 Allergy status to analgesic agent status: Secondary | ICD-10-CM

## 2020-09-14 DIAGNOSIS — Z888 Allergy status to other drugs, medicaments and biological substances status: Secondary | ICD-10-CM

## 2020-09-14 DIAGNOSIS — Z9114 Patient's other noncompliance with medication regimen: Secondary | ICD-10-CM

## 2020-09-14 DIAGNOSIS — I8510 Secondary esophageal varices without bleeding: Secondary | ICD-10-CM | POA: Diagnosis present

## 2020-09-14 DIAGNOSIS — R188 Other ascites: Secondary | ICD-10-CM | POA: Diagnosis present

## 2020-09-14 DIAGNOSIS — K766 Portal hypertension: Secondary | ICD-10-CM | POA: Diagnosis present

## 2020-09-14 DIAGNOSIS — B029 Zoster without complications: Secondary | ICD-10-CM | POA: Diagnosis present

## 2020-09-14 DIAGNOSIS — G8929 Other chronic pain: Secondary | ICD-10-CM | POA: Diagnosis present

## 2020-09-14 DIAGNOSIS — Z560 Unemployment, unspecified: Secondary | ICD-10-CM

## 2020-09-14 DIAGNOSIS — R079 Chest pain, unspecified: Secondary | ICD-10-CM | POA: Diagnosis present

## 2020-09-14 DIAGNOSIS — R0789 Other chest pain: Principal | ICD-10-CM | POA: Diagnosis present

## 2020-09-14 DIAGNOSIS — Z882 Allergy status to sulfonamides status: Secondary | ICD-10-CM

## 2020-09-14 DIAGNOSIS — E669 Obesity, unspecified: Secondary | ICD-10-CM | POA: Diagnosis present

## 2020-09-14 DIAGNOSIS — E1165 Type 2 diabetes mellitus with hyperglycemia: Secondary | ICD-10-CM | POA: Diagnosis present

## 2020-09-14 DIAGNOSIS — I10 Essential (primary) hypertension: Secondary | ICD-10-CM | POA: Diagnosis present

## 2020-09-14 DIAGNOSIS — E1169 Type 2 diabetes mellitus with other specified complication: Secondary | ICD-10-CM | POA: Diagnosis present

## 2020-09-14 DIAGNOSIS — Z6827 Body mass index (BMI) 27.0-27.9, adult: Secondary | ICD-10-CM

## 2020-09-14 DIAGNOSIS — R778 Other specified abnormalities of plasma proteins: Secondary | ICD-10-CM | POA: Diagnosis present

## 2020-09-14 DIAGNOSIS — Z833 Family history of diabetes mellitus: Secondary | ICD-10-CM

## 2020-09-14 DIAGNOSIS — Z95828 Presence of other vascular implants and grafts: Secondary | ICD-10-CM

## 2020-09-14 DIAGNOSIS — Z8249 Family history of ischemic heart disease and other diseases of the circulatory system: Secondary | ICD-10-CM

## 2020-09-14 DIAGNOSIS — E559 Vitamin D deficiency, unspecified: Secondary | ICD-10-CM | POA: Diagnosis present

## 2020-09-14 DIAGNOSIS — Z880 Allergy status to penicillin: Secondary | ICD-10-CM

## 2020-09-14 DIAGNOSIS — E785 Hyperlipidemia, unspecified: Secondary | ICD-10-CM | POA: Diagnosis present

## 2020-09-14 DIAGNOSIS — K5730 Diverticulosis of large intestine without perforation or abscess without bleeding: Secondary | ICD-10-CM | POA: Diagnosis present

## 2020-09-14 DIAGNOSIS — E871 Hypo-osmolality and hyponatremia: Secondary | ICD-10-CM | POA: Diagnosis present

## 2020-09-14 DIAGNOSIS — D638 Anemia in other chronic diseases classified elsewhere: Secondary | ICD-10-CM | POA: Diagnosis present

## 2020-09-14 DIAGNOSIS — K429 Umbilical hernia without obstruction or gangrene: Secondary | ICD-10-CM

## 2020-09-14 DIAGNOSIS — N179 Acute kidney failure, unspecified: Secondary | ICD-10-CM | POA: Diagnosis present

## 2020-09-14 DIAGNOSIS — K652 Spontaneous bacterial peritonitis: Secondary | ICD-10-CM | POA: Diagnosis present

## 2020-09-14 DIAGNOSIS — K5900 Constipation, unspecified: Secondary | ICD-10-CM | POA: Diagnosis present

## 2020-09-14 DIAGNOSIS — Z79899 Other long term (current) drug therapy: Secondary | ICD-10-CM

## 2020-09-14 DIAGNOSIS — K7581 Nonalcoholic steatohepatitis (NASH): Secondary | ICD-10-CM | POA: Diagnosis present

## 2020-09-14 DIAGNOSIS — R791 Abnormal coagulation profile: Secondary | ICD-10-CM | POA: Diagnosis present

## 2020-09-14 LAB — TROPONIN I (HIGH SENSITIVITY)
Troponin I (High Sensitivity): 16 ng/L (ref <18)
Troponin I (High Sensitivity): 16 ng/L (ref <18)

## 2020-09-14 LAB — CBC
HCT: 33.9 % — ABNORMAL LOW (ref 36.0–46.0)
Hemoglobin: 12.4 g/dL (ref 12.0–15.0)
MCH: 32.5 pg (ref 26.0–34.0)
MCHC: 36.6 g/dL — ABNORMAL HIGH (ref 30.0–36.0)
MCV: 89 fL (ref 80.0–100.0)
Platelets: 128 10*3/uL — ABNORMAL LOW (ref 150–400)
RBC: 3.81 MIL/uL — ABNORMAL LOW (ref 3.87–5.11)
RDW: 14.4 % (ref 11.5–15.5)
WBC: 6.7 10*3/uL (ref 4.0–10.5)
nRBC: 0 % (ref 0.0–0.2)

## 2020-09-14 LAB — BASIC METABOLIC PANEL
Anion gap: 10 (ref 5–15)
BUN: 23 mg/dL — ABNORMAL HIGH (ref 6–20)
CO2: 21 mmol/L — ABNORMAL LOW (ref 22–32)
Calcium: 10.4 mg/dL — ABNORMAL HIGH (ref 8.9–10.3)
Chloride: 99 mmol/L (ref 98–111)
Creatinine, Ser: 1.75 mg/dL — ABNORMAL HIGH (ref 0.44–1.00)
GFR, Estimated: 34 mL/min — ABNORMAL LOW (ref >60)
Glucose, Bld: 361 mg/dL — ABNORMAL HIGH (ref 70–99)
Potassium: 4.8 mmol/L (ref 3.5–5.1)
Sodium: 130 mmol/L — ABNORMAL LOW (ref 135–145)

## 2020-09-14 LAB — I-STAT BETA HCG BLOOD, ED (MC, WL, AP ONLY): I-stat hCG, quantitative: <5 m[IU]/mL (ref <5)

## 2020-09-14 NOTE — ED Triage Notes (Signed)
Pt comes for CP and SOB that has been going on for one week, radiation to L arm with nausea

## 2020-09-14 NOTE — ED Provider Notes (Signed)
Emergency Medicine Provider Triage Evaluation Note  Meagan Peck , a 60 y.o. female  was evaluated in triage.  Pt complains of chest pain and SOB that has been ongoing for 1 week. This worsen today with radiation to her left arm and nausea. Has not taken anything for pain controlled. Normal prior Cardiac workup over 5 years ago. No prior hx of CHF, non smoker. Family hx of CAD, father MI at 22  Review of Systems  Positive: Chest pain, shortness of breath Negative: Fever, leg swelling  Physical Exam  BP (!) 134/58 (BP Location: Left Arm)   Pulse 76   Temp 98.2 F (36.8 C) (Oral)   Resp 16   LMP 12/03/2013 (LMP Unknown)   SpO2 100%  Gen:   Awake, no distress   Resp:  Normal effort  MSK:   Moves extremities without difficulty  Other:    Medical Decision Making  Medically screening exam initiated at 7:47 PM.  Appropriate orders placed.  Meagan Peck was informed that the remainder of the evaluation will be completed by another provider, this initial triage assessment does not replace that evaluation, and the importance of remaining in the ED until their evaluation is complete.  Patient here with chest pain for about a week along with shortness of breath, with some radiation to her left arm.  No URI symptoms.  Prior family history of CAD.  No prior history of blood clots, currently not on any blood thinners.  Last cardiac work-up likely updated.   Meagan Fitting, PA-C 09/14/20 1952    Valarie Merino, MD 09/15/20 [phone]

## 2020-09-15 ENCOUNTER — Observation Stay (HOSPITAL_COMMUNITY): Payer: Self-pay

## 2020-09-15 ENCOUNTER — Emergency Department (HOSPITAL_COMMUNITY): Payer: Self-pay

## 2020-09-15 ENCOUNTER — Encounter (HOSPITAL_COMMUNITY): Payer: Self-pay | Admitting: Internal Medicine

## 2020-09-15 DIAGNOSIS — I8500 Esophageal varices without bleeding: Secondary | ICD-10-CM

## 2020-09-15 DIAGNOSIS — K7581 Nonalcoholic steatohepatitis (NASH): Secondary | ICD-10-CM

## 2020-09-15 DIAGNOSIS — K7460 Unspecified cirrhosis of liver: Secondary | ICD-10-CM

## 2020-09-15 DIAGNOSIS — R1084 Generalized abdominal pain: Secondary | ICD-10-CM

## 2020-09-15 DIAGNOSIS — R188 Other ascites: Secondary | ICD-10-CM

## 2020-09-15 DIAGNOSIS — E1169 Type 2 diabetes mellitus with other specified complication: Secondary | ICD-10-CM

## 2020-09-15 DIAGNOSIS — R079 Chest pain, unspecified: Secondary | ICD-10-CM

## 2020-09-15 LAB — BASIC METABOLIC PANEL
Anion gap: 5 (ref 5–15)
BUN: 21 mg/dL — ABNORMAL HIGH (ref 6–20)
CO2: 22 mmol/L (ref 22–32)
Calcium: 9.6 mg/dL (ref 8.9–10.3)
Chloride: 106 mmol/L (ref 98–111)
Creatinine, Ser: 1.48 mg/dL — ABNORMAL HIGH (ref 0.44–1.00)
GFR, Estimated: 41 mL/min — ABNORMAL LOW (ref >60)
Glucose, Bld: 217 mg/dL — ABNORMAL HIGH (ref 70–99)
Potassium: 4.3 mmol/L (ref 3.5–5.1)
Sodium: 133 mmol/L — ABNORMAL LOW (ref 135–145)

## 2020-09-15 LAB — HEPATIC FUNCTION PANEL
ALT: 30 U/L (ref 0–44)
AST: 48 U/L — ABNORMAL HIGH (ref 15–41)
Albumin: 2.9 g/dL — ABNORMAL LOW (ref 3.5–5.0)
Alkaline Phosphatase: 104 U/L (ref 38–126)
Bilirubin, Direct: 1.5 mg/dL — ABNORMAL HIGH (ref 0.0–0.2)
Indirect Bilirubin: 3 mg/dL — ABNORMAL HIGH (ref 0.3–0.9)
Total Bilirubin: 4.5 mg/dL — ABNORMAL HIGH (ref 0.3–1.2)
Total Protein: 5.7 g/dL — ABNORMAL LOW (ref 6.5–8.1)

## 2020-09-15 LAB — HIV ANTIBODY (ROUTINE TESTING W REFLEX): HIV Screen 4th Generation wRfx: NONREACTIVE

## 2020-09-15 LAB — PROTIME-INR
INR: 1.5 — ABNORMAL HIGH (ref 0.8–1.2)
Prothrombin Time: 18.3 seconds — ABNORMAL HIGH (ref 11.4–15.2)

## 2020-09-15 LAB — ECHOCARDIOGRAM COMPLETE
AR max vel: 2.78 cm2
AV Area VTI: 2.79 cm2
AV Area mean vel: 2.7 cm2
AV Mean grad: 10 mmHg
AV Peak grad: 18.7 mmHg
Ao pk vel: 2.16 m/s
Area-P 1/2: 2.62 cm2
MV VTI: 2.53 cm2
S' Lateral: 2.9 cm

## 2020-09-15 LAB — TROPONIN I (HIGH SENSITIVITY)
Troponin I (High Sensitivity): 18 ng/L — ABNORMAL HIGH (ref <18)
Troponin I (High Sensitivity): 19 ng/L — ABNORMAL HIGH (ref <18)

## 2020-09-15 LAB — SARS CORONAVIRUS 2 (TAT 6-24 HRS): SARS Coronavirus 2: NEGATIVE

## 2020-09-15 LAB — CBG MONITORING, ED: Glucose-Capillary: 202 mg/dL — ABNORMAL HIGH (ref 70–99)

## 2020-09-15 LAB — GLUCOSE, CAPILLARY: Glucose-Capillary: 172 mg/dL — ABNORMAL HIGH (ref 70–99)

## 2020-09-15 LAB — LIPASE, BLOOD: Lipase: 36 U/L (ref 11–51)

## 2020-09-15 LAB — HEMOGLOBIN A1C
Hgb A1c MFr Bld: 7.2 % — ABNORMAL HIGH (ref 4.8–5.6)
Mean Plasma Glucose: 159.94 mg/dL

## 2020-09-15 LAB — D-DIMER, QUANTITATIVE: D-Dimer, Quant: 0.76 ug/mL-FEU — ABNORMAL HIGH (ref 0.00–0.50)

## 2020-09-15 MED ORDER — ONDANSETRON HCL 4 MG/2ML IJ SOLN: 4.0000 mg | Freq: Four times a day (QID) | INTRAMUSCULAR | Status: DC | PRN

## 2020-09-15 MED ORDER — ALUM & MAG HYDROXIDE-SIMETH 200-200-20 MG/5ML PO SUSP
30.0000 mL | Freq: Once | ORAL | Status: AC
  Administered 2020-09-15: 30 mL via ORAL
  Filled 2020-09-15: qty 30

## 2020-09-15 MED ORDER — MORPHINE SULFATE (PF) 2 MG/ML IV SOLN
2.0000 mg | INTRAVENOUS | Status: DC | PRN
  Administered 2020-09-15 – 2020-09-19 (×5): 2 mg via INTRAVENOUS
  Filled 2020-09-15 (×5): qty 1

## 2020-09-15 MED ORDER — FENTANYL CITRATE (PF) 100 MCG/2ML IJ SOLN
50.0000 ug | Freq: Once | INTRAMUSCULAR | Status: AC
  Administered 2020-09-15: 50 ug via INTRAVENOUS
  Filled 2020-09-15: qty 2

## 2020-09-15 MED ORDER — AMILORIDE HCL 5 MG PO TABS
10.0000 mg | ORAL_TABLET | Freq: Every day | ORAL | Status: DC
  Administered 2020-09-15 – 2020-09-17 (×3): 10 mg via ORAL
  Filled 2020-09-15 (×3): qty 2

## 2020-09-15 MED ORDER — PANTOPRAZOLE SODIUM 40 MG IV SOLR
40.0000 mg | Freq: Once | INTRAVENOUS | Status: AC
  Administered 2020-09-15: 40 mg via INTRAVENOUS
  Filled 2020-09-15: qty 40

## 2020-09-15 MED ORDER — LACTULOSE 10 GM/15ML PO SOLN
10.0000 g | Freq: Every day | ORAL | Status: DC
  Administered 2020-09-15 – 2020-09-16 (×2): 10 g via ORAL
  Filled 2020-09-15 (×2): qty 15

## 2020-09-15 MED ORDER — ONDANSETRON HCL 4 MG/2ML IJ SOLN
4.0000 mg | Freq: Once | INTRAMUSCULAR | Status: AC
  Administered 2020-09-15: 4 mg via INTRAVENOUS
  Filled 2020-09-15: qty 2

## 2020-09-15 MED ORDER — TECHNETIUM TO 99M ALBUMIN AGGREGATED
4.2000 | Freq: Once | INTRAVENOUS | Status: AC | PRN
  Administered 2020-09-15: 4.2 via INTRAVENOUS

## 2020-09-15 MED ORDER — ONDANSETRON HCL 4 MG PO TABS: 4.0000 mg | ORAL_TABLET | Freq: Four times a day (QID) | ORAL | Status: DC | PRN

## 2020-09-15 MED ORDER — LIDOCAINE VISCOUS HCL 2 % MT SOLN
15.0000 mL | Freq: Once | OROMUCOSAL | Status: AC
  Administered 2020-09-15: 15 mL via ORAL
  Filled 2020-09-15: qty 15

## 2020-09-15 MED ORDER — INSULIN ASPART 100 UNIT/ML IJ SOLN
0.0000 [IU] | Freq: Three times a day (TID) | INTRAMUSCULAR | Status: DC
  Administered 2020-09-15 – 2020-09-16 (×2): 5 [IU] via SUBCUTANEOUS
  Administered 2020-09-16: 2 [IU] via SUBCUTANEOUS
  Administered 2020-09-16: 3 [IU] via SUBCUTANEOUS
  Administered 2020-09-17: 8 [IU] via SUBCUTANEOUS
  Administered 2020-09-17: 3 [IU] via SUBCUTANEOUS
  Administered 2020-09-17: 2 [IU] via SUBCUTANEOUS
  Administered 2020-09-18: 5 [IU] via SUBCUTANEOUS
  Administered 2020-09-18: 3 [IU] via SUBCUTANEOUS
  Administered 2020-09-18: 5 [IU] via SUBCUTANEOUS
  Administered 2020-09-19: 8 [IU] via SUBCUTANEOUS
  Administered 2020-09-19: 5 [IU] via SUBCUTANEOUS

## 2020-09-15 MED ORDER — CIPROFLOXACIN HCL 500 MG PO TABS
500.0000 mg | ORAL_TABLET | Freq: Every day | ORAL | Status: DC
  Administered 2020-09-15 – 2020-09-19 (×5): 500 mg via ORAL
  Filled 2020-09-15 (×5): qty 1

## 2020-09-15 MED ORDER — SODIUM CHLORIDE 0.9 % IV BOLUS
1000.0000 mL | Freq: Once | INTRAVENOUS | Status: AC
  Administered 2020-09-15: 1000 mL via INTRAVENOUS

## 2020-09-15 NOTE — Consult Note (Addendum)
[facility] Surgery Consult Note  Meagan MONRROY [name] 02, 1961  [id number].    Requesting MD: Carmin Muskrat Chief Complaint/Reason for Consult: ventral hernia  HPI:  Meagan Peck is a 60yo female PMH Cirrhosis likely 2/2 NASH versus cryptogenic, s/p TIPS 01/14/20, portal hypertension, esophageal varices, and DM who presented to [facility] yesterday with worsening abdominal and chest pain. States that she was diagnosed with cirrhosis in March 2021. Since that time she has not felt well. States that over the last 1-2 weeks she has had progressive chest pain and shortness of breath with exertion. She also complains of worsening periumbilical abdominal pain. She reports a known ventral hernia for >1 year. States that it will swell and hurt more at times, especially this past week. She also a new purplish discoloration to the area, as well as nausea and constipation. Last BM 4-5 days ago, typically has 1-2 daily. She is passing a lot of flatus.  In the ED patient was found to be afebrile, BP slightly elevated otherwise VSS. WBC 6.7. CT abdomen/pelvis revealed fat and nonobstructed small bowel containing ventral hernia with a 3.4 cm aperture width; cirrhotic hepatic morphology status post TIPS with evidence of portal hypertension; colonic diverticulosis without findings of acute diverticulitis; moderate volume of formed stool throughout the colon. General surgery asked to see.  Abdominal surgical history: c section, laparoscopic cholecystectomy Anticoagulants: none Nonsmoker Denies alcohol or illicit drug use Employment: not currently employed, previously worked as a Careers information officer   Review of Systems  Constitutional: Negative.   Respiratory: Positive for shortness of breath.   Cardiovascular: Positive for chest pain.  Gastrointestinal: Positive for abdominal pain, constipation and nausea. Negative for vomiting.   All systems reviewed and otherwise negative except for as above  Family History   Problem Relation Age of Onset  . Hypertension Mother   . COPD Mother   . Alcoholism Father   . Heart attack Father   . Diabetes Brother   . Diabetes Paternal Grandmother   . Heart disease Paternal Grandmother   . Alcoholism Paternal Uncle   . Alcoholism Maternal Uncle   . Heart disease Maternal Grandmother   . Heart disease Maternal Grandfather   . Heart disease Paternal Grandfather     Past Medical History:  Diagnosis Date  . Acute medial meniscus tear of right knee 12/10/2015  . Allergy   . Anasarca   . Arthritis    bil feet  . Ascites   . Cataract    bilateral - surgery to remove  . Cirrhosis ([place])   . Diabetes mellitus without complication ([place])    type 2 - last a1c was 5.2 per patient, no meds  . Dyspnea    when I have too much fluid  . Esophageal varices ([place])   . GERD (gastroesophageal reflux disease)   . Heart murmur   . Hepatic cirrhosis ([place])   . HLD (hyperlipidemia)    no meds  . Hypertension    no meds  . Iron deficiency anemia 09/26/2018    Past Surgical History:  Procedure Laterality Date  . BIOPSY  08/20/2019   Procedure: BIOPSY;  Surgeon: Jackquline Denmark, MD;  Location: [place];  Service: Gastroenterology;;  . CARPAL TUNNEL RELEASE Right   . CESAREAN SECTION    . CHOLECYSTECTOMY    . COLONOSCOPY  08/2019  . ESOPHAGEAL BANDING N/A 09/19/2019   Procedure: ESOPHAGEAL BANDING;  Surgeon: Yetta Flock, MD;  [facility]: [facility];  Service: Gastroenterology;  Laterality: N/A;  .  ESOPHAGOGASTRODUODENOSCOPY (EGD) WITH PROPOFOL N/A 08/20/2019   Procedure: ESOPHAGOGASTRODUODENOSCOPY (EGD) WITH PROPOFOL;  Surgeon: Jackquline Denmark, MD;  Location: [place];  Service: Gastroenterology;  Laterality: N/A;  . ESOPHAGOGASTRODUODENOSCOPY (EGD) WITH PROPOFOL N/A 09/19/2019   Procedure: ESOPHAGOGASTRODUODENOSCOPY (EGD) WITH PROPOFOL;  Surgeon: Yetta Flock, MD;  [facility];  Service: Gastroenterology;  Laterality: N/A;  .  ESOPHAGOGASTRODUODENOSCOPY (EGD) WITH PROPOFOL N/A 01/16/2020   Procedure: ESOPHAGOGASTRODUODENOSCOPY (EGD) WITH PROPOFOL;  Surgeon: Jerene Bears, MD;  Location: [place] [facility];  Service: Endoscopy;  Laterality: N/A;  . EYE SURGERY Bilateral    removed cataracts  . GASTRIC VARICES BANDING N/A 08/20/2019   Procedure: GASTRIC VARICES BANDING;  Surgeon: Jackquline Denmark, MD;  Location: [place];  Service: Gastroenterology;  Laterality: N/A;  . HOT HEMOSTASIS N/A 01/16/2020   Procedure: HOT HEMOSTASIS (ARGON PLASMA COAGULATION/BICAP);  Surgeon: Jerene Bears, MD;  Location: [facility];  Service: Endoscopy;  Laterality: N/A;  . IR PARACENTESIS  08/01/2019  . IR PARACENTESIS  08/19/2019  . IR PARACENTESIS  09/09/2019  . IR PARACENTESIS  09/23/2019  . IR PARACENTESIS  10/08/2019  . IR PARACENTESIS  10/23/2019  . IR PARACENTESIS  11/12/2019  . IR PARACENTESIS  11/21/2019  . IR PARACENTESIS  11/25/2019  . IR PARACENTESIS  12/05/2019  . IR PARACENTESIS  12/13/2019  . IR PARACENTESIS  12/20/2019  . IR PARACENTESIS  12/26/2019  . IR PARACENTESIS  12/31/2019  . IR PARACENTESIS  01/07/2020  . IR PARACENTESIS  01/10/2020  . IR PARACENTESIS  01/13/2020  . IR PARACENTESIS  01/14/2020  . IR PARACENTESIS  01/20/2020  . IR PARACENTESIS  01/30/2020  . IR PARACENTESIS  02/24/2020  . IR RADIOLOGIST EVAL & MGMT  10/30/2019  . IR RADIOLOGIST EVAL & MGMT  12/11/2019  . IR RADIOLOGIST EVAL & MGMT  12/24/2019  . IR RADIOLOGIST EVAL & MGMT  03/05/2020  . IR RADIOLOGIST EVAL & MGMT  05/15/2020  . IR TIPS  11/25/2019  . IR TIPS  01/14/2020  . KNEE ARTHROSCOPY Right 12/10/2015   Procedure: RIGHT KNEE ARTHROSCOPY WITH PARTIAL MEDIAL MENISCECTOMY;  Surgeon: Mcarthur Rossetti, MD;  Location: [facility];  Service: Orthopedics;  Laterality: Right;  . KNEE ARTHROSCOPY W/ MENISCAL REPAIR Bilateral   . MENISCUS REPAIR Left   . RADIOLOGY WITH ANESTHESIA N/A 11/25/2019   Procedure: TIPS;  Surgeon: Corrie Mckusick, DO;  Location: [place];  Service:  Anesthesiology;  Laterality: N/A;  . RADIOLOGY WITH ANESTHESIA N/A 01/14/2020   Procedure: TIPS;  Surgeon: Corrie Mckusick, DO;  Location: [place];  Service: Anesthesiology;  Laterality: N/A;    Social History:  reports that she has never smoked. She has never used smokeless tobacco. She reports that she does not drink alcohol and does not use drugs.  Allergies:  Allergies  Allergen Reactions  . Penicillins Anaphylaxis    Happened as a young child  . Atrovent [Ipratropium] Palpitations    As per patient  . Naproxen Rash  . Quinine Derivatives Rash  . Sulfa Antibiotics Rash    (Not in a hospital admission)   Prior to Admission medications   Medication Sig Start Date End Date Taking? Authorizing Provider  ACCU-CHEK FASTCLIX LANCETS MISC Check fsbs TID 08/09/16  Yes [provider]  acetaminophen (TYLENOL) 500 MG tablet Take 500-1,000 mg by mouth every 6 (six) hours as needed for moderate pain or headache.   Yes [provider]  AMBULATORY NON FORMULARY MEDICATION Wedge pillow 01/27/20  Yes Early, Coralee Pesa, NP  aMILoride (  MIDAMOR) 5 MG tablet TAKE 4 TABLETS IN THE MORNING AND 2 TABLETS IN THE EVENING Patient taking differently: Take 10 mg by mouth in the morning and at bedtime. 09/03/20 09/03/21 Yes Levin Erp, PA  cetirizine (ZYRTEC) 10 MG tablet Take 1 tablet (10 mg total) by mouth daily. Patient taking differently: Take 10 mg by mouth daily as needed for allergies. 01/02/19  Yes [name], DO  Cholecalciferol (VITAMIN D3) 5000 UNITS TABS Take 5,000 Units by mouth daily.    Yes [provider]  ferrous sulfate 325 (65 FE) MG EC tablet Take 1 tablet (325 mg total) by mouth 2 (two) times daily with a meal. 09/26/18  Yes [name], DO  furosemide (LASIX) 20 MG tablet TAKE 1 TABLET (20 MG TOTAL) BY MOUTH 2 (TWO) TIMES DAILY. Patient taking differently: Take 20-40 mg by mouth See admin instructions. 40 mg in the morning and 20 mg in the evening  02/17/20 02/16/21 Yes Armbruster, Carlota Raspberry, MD  insulin degludec (TRESIBA) 200 UNIT/ML FlexTouch Pen INJECT 10 - 50 UNITS INTO THE SKIN DAILY. TARGET TO FASTING GLUCOSE 150. Patient taking differently: Inject 10-50 Units into the skin daily. 06/29/20 06/29/21 Yes Alexander, Lanelle Bal, DO  insulin lispro (HUMALOG) 200 UNIT/ML KwikPen INJECT 10 - 15 UNITS INTO THE SKIN 3 TIMES DAILY BEFORE MEALS Patient taking differently: Inject 10-15 Units into the skin with breakfast, with lunch, and with evening meal. 07/09/20 07/09/21 Yes [name], DO  Insulin Pen Needle 32G X 4 MM MISC USE TO INJECT TRESIBA 06/29/20 06/29/21 Yes [name], DO  Krill Oil 500 MG CAPS Take 1,000 mg by mouth 2 (two) times daily.   Yes [provider]  lactulose (CHRONULAC) 10 GM/15ML solution Take 15 mLs (10 g total) by mouth daily. Please keep your appt with Dr. Havery Moros on Thursday, 04-30-20. Patient taking differently: Take 10 g by mouth daily. 04/28/20  Yes Armbruster, Carlota Raspberry, MD  magnesium oxide (MAG-OX) 400 (240 Mg) MG tablet TAKE 1 TABLET BY MOUTH TWICE DAILY Patient taking differently: Take 400 mg by mouth 2 (two) times daily. 09/03/20 09/03/21 Yes [name], DO  Multiple Vitamin (MULTI-DAY PO) Take 1 tablet by mouth daily.   Yes [provider]  pantoprazole (PROTONIX) 40 MG tablet TAKE 1 TABLET (40 MG TOTAL) BY MOUTH DAILY. Patient taking differently: Take 40 mg by mouth daily. 02/17/20 02/16/21 Yes Armbruster, Carlota Raspberry, MD  Potassium Chloride ER 20 MEQ TBCR TAKE 1 TABLET BY MOUTH TWICE DAILY Patient taking differently: Take 20 mEq by mouth 2 (two) times daily. 07/07/20 07/07/21 Yes Armbruster, Carlota Raspberry, MD  ciprofloxacin (CIPRO) 500 MG tablet TAKE 1 TABLET BY MOUTH ONCE DAILY Patient not taking: Reported on 09/15/2020 05/15/20 05/15/21  Yetta Flock, MD  ciprofloxacin (CIPRO) 500 MG tablet TAKE 1 TABLET (500 MG TOTAL) BY MOUTH DAILY. Patient not taking: Reported on 09/15/2020  02/20/20 02/19/21  Levin Erp, PA  COVID-19 mRNA vaccine, Pfizer, 30 MCG/0.3ML injection INJECT AS DIRECTED Patient not taking: Reported on 09/15/2020 05/26/20 05/26/21  Carlyle Basques, MD    Blood pressure (!) 147/70, pulse 89, temperature 97.8 F (36.6 C), temperature source Oral, resp. rate 16, last menstrual period 12/03/2013, SpO2 100 %. Physical Exam: General: pleasant, WD/WN female who is laying in bed in NAD HEENT: head is normocephalic, atraumatic.  Sclera are noninjected.  Pupils equal and round.  Ears and nose without any masses or lesions.  Mouth is pink and moist. Dentition fair Heart: regular, rate, and rhythm.  Normal s1,s2. ?faint heart murmur.  Palpable pedal pulses bilaterally  Lungs: CTAB, no wheezes, rhonchi, or rales noted.  Respiratory effort nonlabored Abd: soft, ND, +BS, no masses or organomegaly. Periumbilical ventral hernia with slight purplish discoloration, hernia is soft and partially reducible, mild focal TTP at hernia site MS: no BUE/BLE edema, calves soft and nontender Skin: warm and dry with no masses, lesions, or rashes Psych: A&Ox4 with an appropriate affect Neuro: cranial nerves grossly intact, equal strength in BUE/BLE bilaterally, normal speech, thought process intact  Results for orders placed or performed during the hospital encounter of 09/14/20 (from the past 48 hour(s))  Basic metabolic panel     Status: Abnormal   Collection Time: 09/14/20  7:48 PM  Result Value Ref Range   Sodium 130 (L) 135 - 145 mmol/L   Potassium 4.8 3.5 - 5.1 mmol/L   Chloride 99 98 - 111 mmol/L   CO2 21 (L) 22 - 32 mmol/L   Glucose, Bld 361 (H) 70 - 99 mg/dL    Comment: Glucose reference range applies only to samples taken after fasting for at least 8 hours.   BUN 23 (H) 6 - 20 mg/dL   Creatinine, Ser 1.75 (H) 0.44 - 1.00 mg/dL   Calcium 10.4 (H) 8.9 - 10.3 mg/dL   GFR, Estimated 34 (L) >60 mL/min    Comment: (NOTE) Calculated using the CKD-EPI Creatinine  Equation (2021)    Anion gap 10 5 - 15    Comment: Performed at [facility] [address], [place], [place] [address]  CBC     Status: Abnormal   Collection Time: 09/14/20  7:48 PM  Result Value Ref Range   WBC 6.7 4.0 - 10.5 K/uL   RBC 3.81 (L) 3.87 - 5.11 MIL/uL   Hemoglobin 12.4 12.0 - 15.0 g/dL   HCT 33.9 (L) 36.0 - 46.0 %   MCV 89.0 80.0 - 100.0 fL   MCH 32.5 26.0 - 34.0 pg   MCHC 36.6 (H) 30.0 - 36.0 g/dL   RDW 14.4 11.5 - 15.5 %   Platelets 128 (L) 150 - 400 K/uL   nRBC 0.0 0.0 - 0.2 %    Comment: Performed at [facility], [place] [address] [place], [place] [address]  Troponin I (High Sensitivity)     Status: None   Collection Time: 09/14/20  7:48 PM  Result Value Ref Range   Troponin I (High Sensitivity) 16 <18 ng/L    Comment: (NOTE) Elevated high sensitivity troponin I (hsTnI) values and significant  changes across serial measurements may suggest ACS but many other  chronic and acute conditions are known to elevate hsTnI results.  Refer to the "Links" section for chest pain algorithms and additional  guidance. Performed at [facility], [place] [address] [place], [place] [address]   I-Stat beta hCG blood, ED     Status: None   Collection Time: 09/14/20  9:38 PM  Result Value Ref Range   I-stat hCG, quantitative <5.0 <5 mIU/mL   Comment 3            Comment:   GEST. AGE      CONC.  (mIU/mL)   <=1 WEEK        5 - 50     2 WEEKS       50 - 500     3 WEEKS       100 - 10,000     4 WEEKS  1,000 - 30,000        FEMALE AND NON-PREGNANT FEMALE:     LESS THAN 5 mIU/mL   Troponin I (High Sensitivity)     Status: None   Collection Time: 09/14/20 10:28 PM  Result Value Ref Range   Troponin I (High Sensitivity) 16 <18 ng/L    Comment: (NOTE) Elevated high sensitivity troponin I (hsTnI) values and significant  changes across serial measurements may suggest ACS but many other  chronic and acute conditions are known to elevate hsTnI results.  Refer to  the "Links" section for chest pain algorithms and additional  guidance. Performed at [facility], [place] [address] [place], [place] [address]   Hepatic function panel     Status: Abnormal   Collection Time: 09/15/20  2:09 AM  Result Value Ref Range   Total Protein 5.7 (L) 6.5 - 8.1 g/dL   Albumin 2.9 (L) 3.5 - 5.0 g/dL   AST 48 (H) 15 - 41 U/L   ALT 30 0 - 44 U/L   Alkaline Phosphatase 104 38 - 126 U/L   Total Bilirubin 4.5 (H) 0.3 - 1.2 mg/dL   Bilirubin, Direct 1.5 (H) 0.0 - 0.2 mg/dL   Indirect Bilirubin 3.0 (H) 0.3 - 0.9 mg/dL    Comment: Performed at [facility] [address] [place], [place] [address]  Lipase, blood     Status: None   Collection Time: 09/15/20  2:09 AM  Result Value Ref Range   Lipase 36 11 - 51 U/L    Comment: Performed at [facility] [address] [place], [place] [address]  D-dimer, quantitative     Status: Abnormal   Collection Time: 09/15/20  2:09 AM  Result Value Ref Range   D-Dimer, Quant 0.76 (H) 0.00 - 0.50 ug/mL-FEU    Comment: (NOTE) At the manufacturer cut-off value of 0.5 g/mL FEU, this assay has a negative predictive value of 95-100%.This assay is intended for use in conjunction with a clinical pretest probability (PTP) assessment model to exclude pulmonary embolism (PE) and deep venous thrombosis (DVT) in outpatients suspected of PE or DVT. Results should be correlated with clinical presentation. Performed at [facility], [place] [address] [place], [place] [address]   Troponin I (High Sensitivity)     Status: Abnormal   Collection Time: 09/15/20  5:00 AM  Result Value Ref Range   Troponin I (High Sensitivity) 18 (H) <18 ng/L    Comment: (NOTE) Elevated high sensitivity troponin I (hsTnI) values and significant  changes across serial measurements may suggest ACS but many other  chronic and acute conditions are known to elevate hsTnI results.  Refer to the "Links" section for chest pain algorithms and  additional  guidance. Performed at [facility], [place] [address], [place], [place] [address]   Basic metabolic panel     Status: Abnormal   Collection Time: 09/15/20  5:00 AM  Result Value Ref Range   Sodium 133 (L) 135 - 145 mmol/L   Potassium 4.3 3.5 - 5.1 mmol/L   Chloride 106 98 - 111 mmol/L   CO2 22 22 - 32 mmol/L   Glucose, Bld 217 (H) 70 - 99 mg/dL    Comment: Glucose reference range applies only to samples taken after fasting for at least 8 hours.   BUN 21 (H) 6 - 20 mg/dL   Creatinine, Ser 1.48 (H) 0.44 - 1.00 mg/dL   Calcium 9.6 8.9 - 10.3  mg/dL   GFR, Estimated 41 (L) >60 mL/min    Comment: (NOTE) Calculated using the CKD-EPI Creatinine Equation (2021)    Anion gap 5 5 - 15    Comment: Performed at [facility], [place] [address], [place], [place] [address]   CT ABDOMEN PELVIS WO CONTRAST  Result Date: 09/15/2020 CLINICAL DATA:  Acute abdominal pain, hernia suspected EXAM: CT ABDOMEN AND PELVIS WITHOUT CONTRAST TECHNIQUE: Multidetector CT imaging of the abdomen and pelvis was performed following the standard protocol without IV contrast. COMPARISON:  CT January 21, 2020 FINDINGS: Lower chest: No acute abnormality. Normal size heart. Mitral annular calcifications. Coronary artery calcifications. No significant pericardial effusion/thickening. Hepatobiliary: Cirrhotic hepatic morphology with tips stent in place. Embolization coils in the right lobe of the liver. Trace perihepatic ascites. Gallbladder is surgically absent. No biliary ductal dilation. Pancreas: Within normal limits Spleen: Borderline splenomegaly measuring 12.4 cm in maximum craniocaudal dimension Adrenals/Urinary Tract: Adrenal glands are unremarkable. Kidneys are normal, without renal calculi, focal lesion, or hydronephrosis. Bladder is unremarkable. Stomach/Bowel: Stomach is grossly unremarkable for degree of distension. Normal positioning of the duodenum/ligament of [place]. No pathologic dilation of  small bowel. Appendix grossly unremarkable. Moderate volume of formed stool throughout the colon. Colonic diverticulosis without findings of acute diverticulitis. Vascular/Lymphatic: Aortic and branch vessel atherosclerosis without aneurysmal dilation. Prominent periportal and gastrohepatic lymph nodes measuring up to 9 mm without adenopathy by size criteria likely reactive no pathologically enlarged abdominal or pelvic lymph nodes. Reproductive: Uterus and bilateral adnexa are unremarkable. Other: Fat and nonobstructed small bowel containing ventral hernia with a 3.4 cm aperture width. Mild mesenteric stranding. Diffuse subcutaneous edema and overlying skin thickening. Musculoskeletal: No acute osseous abnormality. IMPRESSION: 1. Fat and nonobstructed small bowel containing ventral hernia with a 3.4 cm aperture width. 2. Cirrhotic hepatic morphology status post TIPS with evidence of portal hypertension. 3. Colonic diverticulosis without findings of acute diverticulitis. 4. Moderate volume of formed stool throughout the colon. 5. Aortic atherosclerosis. Aortic Atherosclerosis (ICD10-I70.0). Electronically Signed   By: Dahlia Bailiff MD   On: 09/15/2020 03:44   DG Chest 2 View  Result Date: 09/14/2020 CLINICAL DATA:  One day chest pain EXAM: CHEST - 2 VIEW COMPARISON:  June 25, 2020 FINDINGS: The heart size and mediastinal contours are within normal limits. No focal consolidation. No pleural effusion. No pneumothorax. The visualized skeletal structures are unremarkable. Cholecystectomy clips. Upper abdominal embolization coils. TIPS stent overlies the right upper quadrant. IMPRESSION: No acute cardiopulmonary disease. Electronically Signed   By: Dahlia Bailiff MD   On: 09/14/2020 20:58   NM PULMONARY VENT AND PERF (V/Q Scan)  Result Date: 09/15/2020 CLINICAL DATA:  60 year old female with acute pain. Cirrhosis, TIPS. EXAM: NUCLEAR MEDICINE PERFUSION LUNG SCAN TECHNIQUE: Perfusion images were obtained in  multiple projections after intravenous injection of radiopharmaceutical. Ventilation scans intentionally deferred if perfusion scan and chest x-ray adequate for interpretation during COVID 19 epidemic. RADIOPHARMACEUTICALS:  4.2 mCi Tc-58mMAA IV COMPARISON:  Noncontrast CT Abdomen and Pelvis 0323 hours today. Two-view chest radiographs 09/14/2020. FINDINGS: Homogeneous perfusion radiotracer activity throughout both lungs. No perfusion defect. IMPRESSION: Normal lung perfusion.  No evidence of pulmonary embolus. Electronically Signed   By: HGenevie AnnM.D.   On: 09/15/2020 07:53    Anti-infectives (From admission, onward)   None       Assessment/Plan Cirrhosis - likely 2/2 NASH versus cryptogenic, followed by Dr. AHavery MorosS/p TIPS 01/14/20 Portal hypertension Esophageal varices Anemia Thrombocytopenia DM GERD Constipation Chest pain/ SOB  Ventral hernia containing fat and small bowel - Hernia is soft and mostly reducible. I'm concerned about the new purplish discoloration of her skin overlying the hernia, but abdomen focally tender at site of hernia and tenderness is fairly mild. She does not have any peritonitis. She does not have obstructive symptoms including no emesis and she is passing flatus. She would be high risk for surgical intervention, and I do not think she will need emergent surgery for this hernia. This was reviewed with MD. Patient does warrant medical admission. Ok for diet from surgical standpoint. Likely needs bowel regimen for constipation. We will follow.   Wellington Hampshire, PA-C [facility] Surgery 09/15/2020, 8:36 AM Please see Amion for pager number during day hours 7:00am-4:30pm

## 2020-09-15 NOTE — Progress Notes (Signed)
  Echocardiogram 2D Echocardiogram has been performed.  Meagan Peck 09/15/2020, 3:37 PM

## 2020-09-15 NOTE — H&P (Addendum)
History and Physical    Meagan Peck BSJ:[id number] DOB: [birth year] DOA: 09/14/2020  PCP: Pcp, No    Patient coming from:  Home    Chief Complaint:  Chest Pain.   HPI: Meagan Peck is a 60 y.o. female seen in ed with complaints of left sided chest pain, for 1-2  week.pressure sensation that also affects her shoulder and upper back, also assoc with palpitation, constant pain and no alleviating .+ nausea/ no sob or edema or bleeding.  Patient also has complaints of periumbilical abdominal pain and has a known ventral hernia for nearly a year.  Patient reports swelling in the area and pain as well as nausea and constipation. Pt states she has been off abx, has never had a MMG and    Pt has past medical history of cirrhosis, esophageal varices status post TIPS, diabetes mellitus type 2, GERD, hypertension. ED Course:  Vitals:   09/15/20 1115 09/15/20 1130 09/15/20 1145 09/15/20 1200  BP: (!) 129/57 (!) 125/57 (!) 127/54 125/63  Pulse: 83 72 88 95  Resp: _0 Temp:      TempSrc:      SpO2: 99% 99% 99% 100%  In the ED patient is alert awake oriented afebrile and vitals as above. CMP shows mild hyponatremia with a sodium of 133, glucose of 217, serum creatinine of 1.48, alk phos of 104, albumin of 2.9, AST of 48, total protein 5.7, abnormal bilirubin with a direct bili of 1.5, indirect of 3.0 , Total of 4.5.  D-dimer 0.76, initial troponin of 16 and has gone up to 18 and 19.  In the ED patient was seen by general surgery for her hiatal hernia,    Review of Systems:  Review of Systems  Cardiovascular: Positive for chest pain and palpitations.  Gastrointestinal: Positive for abdominal pain and nausea.  All other systems reviewed and are negative.    Past Medical History:  Diagnosis Date  . Acute medial meniscus tear of right knee 12/10/2015  . Allergy   . Anasarca   . Arthritis    bil feet  . Ascites   . Cataract    bilateral - surgery to remove  . Cirrhosis  ([place])   . Diabetes mellitus without complication ([place])    type 2 - last a1c was 5.2 per patient, no meds  . Dyspnea    when I have too much fluid  . Esophageal varices ([place])   . GERD (gastroesophageal reflux disease)   . Heart murmur   . Hepatic cirrhosis ([place])   . HLD (hyperlipidemia)    no meds  . Hypertension    no meds  . Iron deficiency anemia 09/26/2018    Past Surgical History:  Procedure Laterality Date  . BIOPSY  08/20/2019   Procedure: BIOPSY;  Surgeon: Jackquline Denmark, MD;  Location: [place];  Service: Gastroenterology;;  . CARPAL TUNNEL RELEASE Right   . CESAREAN SECTION    . CHOLECYSTECTOMY    . COLONOSCOPY  08/2019  . ESOPHAGEAL BANDING N/A 09/19/2019   Procedure: ESOPHAGEAL BANDING;  Surgeon: Yetta Flock, MD;  [facility];  Service: Gastroenterology;  Laterality: N/A;  . ESOPHAGOGASTRODUODENOSCOPY (EGD) WITH PROPOFOL N/A 08/20/2019   Procedure: ESOPHAGOGASTRODUODENOSCOPY (EGD) WITH PROPOFOL;  Surgeon: Jackquline Denmark, MD;  Location: [place];  Service: Gastroenterology;  Laterality: N/A;  . ESOPHAGOGASTRODUODENOSCOPY (EGD) WITH PROPOFOL N/A 09/19/2019   Procedure: ESOPHAGOGASTRODUODENOSCOPY (EGD) WITH PROPOFOL;  Surgeon: Yetta Flock, MD;  [facility];  Service: Gastroenterology;  Laterality: N/A;  . ESOPHAGOGASTRODUODENOSCOPY (EGD) WITH PROPOFOL N/A 01/16/2020   Procedure: ESOPHAGOGASTRODUODENOSCOPY (EGD) WITH PROPOFOL;  Surgeon: Jerene Bears, MD;  Location: [facility];  Service: Endoscopy;  Laterality: N/A;  . EYE SURGERY Bilateral    removed cataracts  . GASTRIC VARICES BANDING N/A 08/20/2019   Procedure: GASTRIC VARICES BANDING;  Surgeon: Jackquline Denmark, MD;  Location: [place];  Service: Gastroenterology;  Laterality: N/A;  . HOT HEMOSTASIS N/A 01/16/2020   Procedure: HOT HEMOSTASIS (ARGON PLASMA COAGULATION/BICAP);  Surgeon: Jerene Bears, MD;  Location[facility];  Service: Endoscopy;  Laterality: N/A;  . IR  PARACENTESIS  08/01/2019  . IR PARACENTESIS  08/19/2019  . IR PARACENTESIS  09/09/2019  . IR PARACENTESIS  09/23/2019  . IR PARACENTESIS  10/08/2019  . IR PARACENTESIS  10/23/2019  . IR PARACENTESIS  11/12/2019  . IR PARACENTESIS  11/21/2019  . IR PARACENTESIS  11/25/2019  . IR PARACENTESIS  12/05/2019  . IR PARACENTESIS  12/13/2019  . IR PARACENTESIS  12/20/2019  . IR PARACENTESIS  12/26/2019  . IR PARACENTESIS  12/31/2019  . IR PARACENTESIS  01/07/2020  . IR PARACENTESIS  01/10/2020  . IR PARACENTESIS  01/13/2020  . IR PARACENTESIS  01/14/2020  . IR PARACENTESIS  01/20/2020  . IR PARACENTESIS  01/30/2020  . IR PARACENTESIS  02/24/2020  . IR RADIOLOGIST EVAL & MGMT  10/30/2019  . IR RADIOLOGIST EVAL & MGMT  12/11/2019  . IR RADIOLOGIST EVAL & MGMT  12/24/2019  . IR RADIOLOGIST EVAL & MGMT  03/05/2020  . IR RADIOLOGIST EVAL & MGMT  05/15/2020  . IR TIPS  11/25/2019  . IR TIPS  01/14/2020  . KNEE ARTHROSCOPY Right 12/10/2015   Procedure: RIGHT KNEE ARTHROSCOPY WITH PARTIAL MEDIAL MENISCECTOMY;  Surgeon: Mcarthur Rossetti, MD;  Location: [facility];  Service: Orthopedics;  Laterality: Right;  . KNEE ARTHROSCOPY W/ MENISCAL REPAIR Bilateral   . MENISCUS REPAIR Left   . RADIOLOGY WITH ANESTHESIA N/A 11/25/2019   Procedure: TIPS;  Surgeon: Corrie Mckusick, DO;  Location: [place];  Service: Anesthesiology;  Laterality: N/A;  . RADIOLOGY WITH ANESTHESIA N/A 01/14/2020   Procedure: TIPS;  Surgeon: Corrie Mckusick, DO;  Location: [place];  Service: Anesthesiology;  Laterality: N/A;     reports that she has never smoked. She has never used smokeless tobacco. She reports that she does not drink alcohol and does not use drugs.  Allergies  Allergen Reactions  . Penicillins Anaphylaxis    Happened as a young child  . Atrovent [Ipratropium] Palpitations    As per patient  . Naproxen Rash  . Quinine Derivatives Rash  . Sulfa Antibiotics Rash    Family History  Problem Relation Age of Onset  . Hypertension Mother   .  COPD Mother   . Alcoholism Father   . Heart attack Father   . Diabetes Brother   . Diabetes Paternal Grandmother   . Heart disease Paternal Grandmother   . Alcoholism Paternal Uncle   . Alcoholism Maternal Uncle   . Heart disease Maternal Grandmother   . Heart disease Maternal Grandfather   . Heart disease Paternal Grandfather     Prior to Admission medications   Medication Sig Start Date End Date Taking? Authorizing Provider  ACCU-CHEK FASTCLIX LANCETS MISC Check fsbs TID 08/09/16  Yes [provider]  acetaminophen (TYLENOL) 500 MG tablet Take 500-1,000 mg by mouth every 6 (six) hours as needed for moderate pain or headache.   Yes [provider]  AMBULATORY NON FORMULARY MEDICATION Wedge pillow 01/27/20  Yes Early, Coralee Pesa, NP  aMILoride (MIDAMOR) 5 MG tablet TAKE 4 TABLETS IN THE MORNING AND 2 TABLETS IN THE EVENING Patient taking differently: Take 10 mg by mouth in the morning and at bedtime. 09/03/20 09/03/21 Yes Levin Erp, PA  cetirizine (ZYRTEC) 10 MG tablet Take 1 tablet (10 mg total) by mouth daily. Patient taking differently: Take 10 mg by mouth daily as needed for allergies. 01/02/19  Yes [name], DO  Cholecalciferol (VITAMIN D3) 5000 UNITS TABS Take 5,000 Units by mouth daily.    Yes [provider]  ferrous sulfate 325 (65 FE) MG EC tablet Take 1 tablet (325 mg total) by mouth 2 (two) times daily with a meal. 09/26/18  Yes [name], DO  furosemide (LASIX) 20 MG tablet TAKE 1 TABLET (20 MG TOTAL) BY MOUTH 2 (TWO) TIMES DAILY. Patient taking differently: Take 20-40 mg by mouth See admin instructions. 40 mg in the morning and 20 mg in the evening 02/17/20 02/16/21 Yes Armbruster, Carlota Raspberry, MD  insulin degludec (TRESIBA) 200 UNIT/ML FlexTouch Pen INJECT 10 - 50 UNITS INTO THE SKIN DAILY. T[employer] TO FASTING GLUCOSE 150. Patient taking differently: Inject 10-50 Units into the skin daily. 06/29/20 06/29/21 Yes Alexander, Lanelle Bal, DO   insulin lispro (HUMALOG) 200 UNIT/ML KwikPen INJECT 10 - 15 UNITS INTO THE SKIN 3 TIMES DAILY BEFORE MEALS Patient taking differently: Inject 10-15 Units into the skin with breakfast, with lunch, and with evening meal. 07/09/20 07/09/21 Yes [name], DO  Insulin Pen Needle 32G X 4 MM MISC USE TO INJECT TRESIBA 06/29/20 06/29/21 Yes [name], DO  Krill Oil 500 MG CAPS Take 1,000 mg by mouth 2 (two) times daily.   Yes [provider]  lactulose (CHRONULAC) 10 GM/15ML solution Take 15 mLs (10 g total) by mouth daily. Please keep your appt with Dr. Havery Moros on Thursday, 04-30-20. Patient taking differently: Take 10 g by mouth daily. 04/28/20  Yes Armbruster, Carlota Raspberry, MD  magnesium oxide (MAG-OX) 400 (240 Mg) MG tablet TAKE 1 TABLET BY MOUTH TWICE DAILY Patient taking differently: Take 400 mg by mouth 2 (two) times daily. 09/03/20 09/03/21 Yes [name], DO  Multiple Vitamin (MULTI-DAY PO) Take 1 tablet by mouth daily.   Yes [provider]  pantoprazole (PROTONIX) 40 MG tablet TAKE 1 TABLET (40 MG TOTAL) BY MOUTH DAILY. Patient taking differently: Take 40 mg by mouth daily. 02/17/20 02/16/21 Yes Armbruster, Carlota Raspberry, MD  Potassium Chloride ER 20 MEQ TBCR TAKE 1 TABLET BY MOUTH TWICE DAILY Patient taking differently: Take 20 mEq by mouth 2 (two) times daily. 07/07/20 07/07/21 Yes Armbruster, Carlota Raspberry, MD  ciprofloxacin (CIPRO) 500 MG tablet TAKE 1 TABLET BY MOUTH ONCE DAILY Patient not taking: Reported on 09/15/2020 05/15/20 05/15/21  Yetta Flock, MD  ciprofloxacin (CIPRO) 500 MG tablet TAKE 1 TABLET (500 MG TOTAL) BY MOUTH DAILY. Patient not taking: Reported on 09/15/2020 02/20/20 02/19/21  Levin Erp, PA  COVID-19 mRNA vaccine, Pfizer, 30 MCG/0.3ML injection INJECT AS DIRECTED Patient not taking: Reported on 09/15/2020 05/26/20 05/26/21  Carlyle Basques, MD    Physical Exam: Vitals:   09/15/20 1115 09/15/20 1130 09/15/20 1145 09/15/20 1200   BP: (!) 129/57 (!) 125/57 (!) 127/54 125/63  Pulse: 83 72 88 95  Resp: _0 Temp:      TempSrc:      SpO2: 99% 99% 99% 100%   Physical  Exam Vitals and nursing note reviewed.  Constitutional:      General: She is not in acute distress.    Appearance: She is obese. She is not ill-appearing, toxic-appearing or diaphoretic.  HENT:     Head: Normocephalic and atraumatic.     Right Ear: External ear normal.     Left Ear: External ear normal.     Nose: Nose normal.     Mouth/Throat:     Mouth: Mucous membranes are moist.  Eyes:     Extraocular Movements: Extraocular movements intact.     Pupils: Pupils are equal, round, and reactive to light.  Cardiovascular:     Rate and Rhythm: Normal rate and regular rhythm.     Pulses: Normal pulses.          Dorsalis pedis pulses are 2+ on the right side and 2+ on the left side.       Posterior tibial pulses are 2+ on the right side and 2+ on the left side.     Heart sounds: Normal heart sounds.  Pulmonary:     Effort: Pulmonary effort is normal.     Breath sounds: Normal breath sounds.  Chest:     Chest wall: Tenderness present. No mass, lacerations, deformity, swelling, crepitus or edema. There is no dullness to percussion.  Breasts:     Left: Tenderness present. No swelling, bleeding, inverted nipple, mass, nipple discharge or skin change.    Abdominal:     General: Bowel sounds are normal. There is no distension or abdominal bruit.     Palpations: Abdomen is soft. There is no shifting dullness, fluid wave, hepatomegaly, splenomegaly, mass or pulsatile mass.     Tenderness: There is generalized abdominal tenderness. There is guarding.  Musculoskeletal:       Arms:     Cervical back: Normal range of motion and neck supple.     Right lower leg: No edema.     Left lower leg: No edema.  Skin:    General: Skin is warm.  Neurological:     General: No focal deficit present.     Mental Status: She is alert and oriented to person,  place, and time.  Psychiatric:        Mood and Affect: Mood normal.        Behavior: Behavior normal.    Labs on Admission: I have personally reviewed following labs and imaging studies  No results for input(s): CKTOTAL, CKMB, TROPONINI in the last 72 hours. Lab Results  Component Value Date   WBC 6.7 09/14/2020   HGB 12.4 09/14/2020   HCT 33.9 (L) 09/14/2020   MCV 89.0 09/14/2020   PLT 128 (L) 09/14/2020    Recent Labs  Lab 09/15/20 0209 09/15/20 0500  NA  --  133*  K  --  4.3  CL  --  106  CO2  --  22  BUN  --  21*  CREATININE  --  1.48*  CALCIUM  --  9.6  PROT 5.7*  --   BILITOT 4.5*  --   ALKPHOS 104  --   ALT 30  --   AST 48*  --   GLUCOSE  --  217*   Lab Results  Component Value Date   CHOL 121 04/30/2020   HDL 38.30 (L) 04/30/2020   LDLCALC 71 04/30/2020   TRIG 56.0 04/30/2020   Lab Results  Component Value Date   DDIMER 0.76 (H) 09/15/2020   Urinalysis  Component Value Date/Time   COLORURINE AMBER (A) 01/07/2020 1955   APPEARANCEUR HAZY (A) 01/07/2020 1955   LABSPEC 1.019 01/07/2020 1955   PHURINE 5.0 01/07/2020 1955   GLUCOSEU NEGATIVE 01/07/2020 1955   HGBUR MODERATE (A) 01/07/2020 1955   BILIRUBINUR negative 06/26/2020 1337   KETONESUR negative 06/26/2020 [place] 01/07/2020 1955   PROTEINUR NEGATIVE 01/07/2020 1955   UROBILINOGEN 2.0 (A) 06/26/2020 1337   NITRITE Negative 06/26/2020 1337   NITRITE NEGATIVE 01/07/2020 1955   LEUKOCYTESUR Negative 06/26/2020 1337   LEUKOCYTESUR LARGE (A) 01/07/2020 1955   COVID-19 Labs Recent Labs    09/15/20 0209  DDIMER 0.76*    Lab Results  Component Value Date   SARSCOV2NAA NEGATIVE 01/08/2020   [place] NEGATIVE 12/20/2019   [place] NEGATIVE 11/21/2019   [place] NEGATIVE 11/08/2019   Radiological Exams on Admission: CT ABDOMEN PELVIS WO CONTRAST  Result Date: 09/15/2020 CLINICAL DATA:  Acute abdominal pain, hernia suspected EXAM: CT ABDOMEN AND PELVIS  WITHOUT CONTRAST TECHNIQUE: Multidetector CT imaging of the abdomen and pelvis was performed following the standard protocol without IV contrast. COMPARISON:  CT January 21, 2020 FINDINGS: Lower chest: No acute abnormality. Normal size heart. Mitral annular calcifications. Coronary artery calcifications. No significant pericardial effusion/thickening. Hepatobiliary: Cirrhotic hepatic morphology with tips stent in place. Embolization coils in the right lobe of the liver. Trace perihepatic ascites. Gallbladder is surgically absent. No biliary ductal dilation. Pancreas: Within normal limits Spleen: Borderline splenomegaly measuring 12.4 cm in maximum craniocaudal dimension Adrenals/Urinary Tract: Adrenal glands are unremarkable. Kidneys are normal, without renal calculi, focal lesion, or hydronephrosis. Bladder is unremarkable. Stomach/Bowel: Stomach is grossly unremarkable for degree of distension. Normal positioning of the duodenum/ligament of [place]. No pathologic dilation of small bowel. Appendix grossly unremarkable. Moderate volume of formed stool throughout the colon. Colonic diverticulosis without findings of acute diverticulitis. Vascular/Lymphatic: Aortic and branch vessel atherosclerosis without aneurysmal dilation. Prominent periportal and gastrohepatic lymph nodes measuring up to 9 mm without adenopathy by size criteria likely reactive no pathologically enlarged abdominal or pelvic lymph nodes. Reproductive: Uterus and bilateral adnexa are unremarkable. Other: Fat and nonobstructed small bowel containing ventral hernia with a 3.4 cm aperture width. Mild mesenteric stranding. Diffuse subcutaneous edema and overlying skin thickening. Musculoskeletal: No acute osseous abnormality. IMPRESSION: 1. Fat and nonobstructed small bowel containing ventral hernia with a 3.4 cm aperture width. 2. Cirrhotic hepatic morphology status post TIPS with evidence of portal hypertension. 3. Colonic diverticulosis without  findings of acute diverticulitis. 4. Moderate volume of formed stool throughout the colon. 5. Aortic atherosclerosis. Aortic Atherosclerosis (ICD10-I70.0). Electronically Signed   By: Dahlia Bailiff MD   On: 09/15/2020 03:44   DG Chest 2 View  Result Date: 09/14/2020 CLINICAL DATA:  One day chest pain EXAM: CHEST - 2 VIEW COMPARISON:  June 25, 2020 FINDINGS: The heart size and mediastinal contours are within normal limits. No focal consolidation. No pleural effusion. No pneumothorax. The visualized skeletal structures are unremarkable. Cholecystectomy clips. Upper abdominal embolization coils. TIPS stent overlies the right upper quadrant. IMPRESSION: No acute cardiopulmonary disease. Electronically Signed   By: Dahlia Bailiff MD   On: 09/14/2020 20:58   NM PULMONARY VENT AND PERF (V/Q Scan)  Result Date: 09/15/2020 CLINICAL DATA:  60 year old female with acute pain. Cirrhosis, TIPS. EXAM: NUCLEAR MEDICINE PERFUSION LUNG SCAN TECHNIQUE: Perfusion images were obtained in multiple projections after intravenous injection of radiopharmaceutical. Ventilation scans intentionally deferred if perfusion scan and chest x-ray adequate for interpretation during COVID 19 epidemic. RADIOPHARMACEUTICALS:  4.2 mCi Tc-56mMAA IV COMPARISON:  Noncontrast CT Abdomen and Pelvis 0323 hours today. Two-view chest radiographs 09/14/2020. FINDINGS: Homogeneous perfusion radiotracer activity throughout both lungs. No perfusion defect. IMPRESSION: Normal lung perfusion.  No evidence of pulmonary embolus. Electronically Signed   By: HGenevie AnnM.D.   On: 09/15/2020 07:53    EKG: Independently reviewed.  SR 68.  Echocardiogram: None    Assessment/Plan Principal Problem:   Chest pain Active Problems:   Gastroesophageal reflux disease   Type 2 diabetes mellitus with other specified complication (HCC)   Esophageal varices without bleeding (HCC)   SBP (spontaneous bacterial peritonitis) ([place]   Liver cirrhosis secondary to  NASH ([facility] Chest pain: We will admit pt to telemetry unit. Cycle troponin and obtain v/q scan/ 2 d echo. I suspect this may be noncardiac and rule out pe. Cont to t./t GERD. Supplemental oxygen and prn morphine for pain.  Pt also to have MMG and usg of bl breast for screening and also due to pain.   GERD: Iv ppi.  DM II: Ssi/ accuchecks  NKarlene Linemancirrhosis/ esophageal varices: Pt seen by GI and plan to resume cipro And obtain liver usg.   Elevated bilirubin : Attribute to cirrhosis and plan per gi.    DVT prophylaxis:  Heparin  Code Status:  Full code  Family Communication:  Cass,Jody (Spouse)  [phone](Home Phone)  Disposition Plan:  Home  Consults called:  General surgery LMaryanna ShapeGI  Admission status: Inpatient   EPara SkeansMD [facility] [phone] to contact the TSt. Elizabeth'S Medical CenterAttending or Consulting provider 7[place] covering provider during after hours 7[place] for this patient.    1. Check the care team in [facility] Hospitaland look for a) attending/consulting TBusseyprovider listed and b) the TUpson Regional Medical Centerteam listed 2. Log into [email or web address] and use [place]'s universal password to access. If you do not have the password, please contact the hospital operator. 3. Locate the TSouth Loop Endoscopy And Wellness Center LLCprovider you are looking for under [facility] and page to a number that you can be directly reached. 4. If you still have difficulty reaching the provider, please page the [facility](Director on Call) for the Hospitalists listed on amion for assistance. [email or web address] Password [email or web address] Hospital5/17/2022, 12:59 PM

## 2020-09-15 NOTE — ED Notes (Signed)
Administered tap water enema until pt felt abdominal discomfort, 500 mL administered. Fecal matter noted. Pt placed on hospital brief.

## 2020-09-15 NOTE — ED Notes (Signed)
Echo at bedside

## 2020-09-15 NOTE — Consult Note (Signed)
[facility] Consult: 11:30 AM 09/15/2020  LOS: 0 days    Referring Provider: Dr. Vanita Panda in the ED. Primary Care Physician:  Pcp, No Primary Gastroenterologist:  Dr Havery Moros.    Reason for Consultation: Abdominal and chest pain.   HPI: Meagan Peck is a 60 y.o. female.  PMH outlined below.  History NASH cirrhosis.  01/2020 DIPS for refractory ascites, frequent paracentesis, electrolyte issues with diuretics including hyperkalemia, hyponatremia.  SBP, on Cipro prophylaxis.  IDA. Home meds include amiloride, ferrous sulfate. Lasix 60 mg daily.  Protonix 40 mg daily.  KCl 20 mEq bid, insulin.  Ran out of Cipro about a week ago.  Without medical insurance is not able to afford a visit to the MD office in order to refill the prescription.   08/13/2019 colonoscopy:  A few, nonbleeding AVMs in the sigmoid, descending and IC valve area, not treated or ablated.  Nonbleeding small internal hemorrhoids. 08/20/2019 EGD.  Grade 2 and large, nonbleeding esophageal varices, banded x5.  Portal hypertensive gastropathy.  Solitary duodenal nodule, biopsied (path benign). 09/19/2019 EGD. 5 bands placed resulting in deflation of medium to large esophageal varices with red markings.  Stable duodenal polyp. 01/16/2020 EGD.  Grade 1, small esophageal varices, not banded.  2, nonbleeding gastric AVMs treated with APC.  No gastric varices, normal examined duodenum.  Presents to ED last night.  For about a week she has had pain in her lower abdomen, LUQ radiating to the armpit, refractory to Tylenol.  Pain is not exertional.  + DOE..  Nausea w/o emesis.  Anorexia.  Has noticed some purplish discoloration at the umbilical hernia. Normally has brown, formed stools at least twice daily but for about a week she has been passing flatus but no  stools. BPs anywhere from 120s-130s over 50s-70.  Heart rate in the 70s to 80s.  Oxygen sats at or near 100% on room air. CTAP w/o contrast: Fat and nonobstructed small bowel contained in ventral hernia.  Cirrhosis with post TIPS morphology.  Trace perihepatic ascites. Prominent periportal and gastrohepatic lymph nodes up to 9 mm without adenopathy by size criteria, likely reactive.  Colonic diverticulosis.  Formed stool throughout the colon.  Aortic atherosclerosis. Non-ischemic EKG.   2 V CXR: Unremarkable. VQ scan: Normal.  No PE. Na 130 >> 133.  BUN/creatinine 23/1.7 >> 21/1.4.  GFR 34 >> 41. T bili 4.5.  Alk phos 104.  AST/ALT 48/30. Hb 12.4.  WBC 6.7.  Platelets 128. Initial blood glucose 361. Troponins 16..  18..  19. Received single dose of IV albumin.  Surgery consulted for the ventral hernia which is soft, mostly reducible.  Given no obstructive symptoms (no emesis, passing flatus), no signs of peritonitis and mild nature of tenderness there are no plans for surgery in this high risk patient.  Also approved her for diet.  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 Lives with her husband in [place].  No alcohol or cigarettes.   Past Medical History:  Diagnosis Date  .  Acute medial meniscus tear of right knee 12/10/2015  . Allergy   . Anasarca   . Arthritis    bil feet  . Ascites   . Cataract    bilateral - surgery to remove  . Cirrhosis ([place])   . Diabetes mellitus without complication ([place])    type 2 - last a1c was 5.2 per patient, no meds  . Dyspnea    when I have too much fluid  . Esophageal varices ([place])   . GERD (gastroesophageal reflux disease)   . Heart murmur   . Hepatic cirrhosis ([place])   . HLD (hyperlipidemia)    no meds  . Hypertension    no meds  . Iron deficiency anemia 09/26/2018    Past Surgical History:  Procedure Laterality Date  . BIOPSY  08/20/2019   Procedure: BIOPSY;  Surgeon: Jackquline Denmark, MD;  Location: [place];  Service: Gastroenterology;;  . CARPAL TUNNEL RELEASE Right   . CESAREAN SECTION    . CHOLECYSTECTOMY    . COLONOSCOPY  08/2019  . ESOPHAGEAL BANDING N/A 09/19/2019   Procedure: ESOPHAGEAL BANDING;  Surgeon: Yetta Flock, MD;  Location: [facility];  Service: Gastroenterology;  Laterality: N/A;  . ESOPHAGOGASTRODUODENOSCOPY (EGD) WITH PROPOFOL N/A 08/20/2019   Procedure: ESOPHAGOGASTRODUODENOSCOPY (EGD) WITH PROPOFOL;  Surgeon: Jackquline Denmark, MD;  Location: [place];  Service: Gastroenterology;  Laterality: N/A;  . ESOPHAGOGASTRODUODENOSCOPY (EGD) WITH PROPOFOL N/A 09/19/2019   Procedure: ESOPHAGOGASTRODUODENOSCOPY (EGD) WITH PROPOFOL;  Surgeon: Yetta Flock, MD;  Location: [facility];  Service: Gastroenterology;  Laterality: N/A;  . ESOPHAGOGASTRODUODENOSCOPY (EGD) WITH PROPOFOL N/A 01/16/2020   Procedure: ESOPHAGOGASTRODUODENOSCOPY (EGD) WITH PROPOFOL;  Surgeon: Jerene Bears, MD;  Location: [facility];  Service: Endoscopy;  Laterality: N/A;  . EYE SURGERY Bilateral    removed cataracts  . GASTRIC VARICES BANDING N/A 08/20/2019   Procedure: GASTRIC VARICES BANDING;  Surgeon: Jackquline Denmark, MD;  Location: [facility];  Service: Gastroenterology;  Laterality: N/A;  . HOT HEMOSTASIS N/A 01/16/2020   Procedure: HOT HEMOSTASIS (ARGON PLASMA COAGULATION/BICAP);  Surgeon: Jerene Bears, MD;  Location: [facility];  Service: Endoscopy;  Laterality: N/A;  . IR PARACENTESIS  08/01/2019  . IR PARACENTESIS  08/19/2019  . IR PARACENTESIS  09/09/2019  . IR PARACENTESIS  09/23/2019  . IR PARACENTESIS  10/08/2019  . IR PARACENTESIS  10/23/2019  . IR PARACENTESIS  11/12/2019  . IR PARACENTESIS  11/21/2019  . IR PARACENTESIS  11/25/2019  . IR PARACENTESIS  12/05/2019  . IR PARACENTESIS  12/13/2019  . IR PARACENTESIS  12/20/2019  . IR PARACENTESIS  12/26/2019  . IR PARACENTESIS  12/31/2019  . IR PARACENTESIS  01/07/2020  . IR PARACENTESIS  01/10/2020  . IR  PARACENTESIS  01/13/2020  . IR PARACENTESIS  01/14/2020  . IR PARACENTESIS  01/20/2020  . IR PARACENTESIS  01/30/2020  . IR PARACENTESIS  02/24/2020  . IR RADIOLOGIST EVAL & MGMT  10/30/2019  . IR RADIOLOGIST EVAL & MGMT  12/11/2019  . IR RADIOLOGIST EVAL & MGMT  12/24/2019  . IR RADIOLOGIST EVAL & MGMT  03/05/2020  . IR RADIOLOGIST EVAL & MGMT  05/15/2020  . IR TIPS  11/25/2019  . IR TIPS  01/14/2020  . KNEE ARTHROSCOPY Right 12/10/2015   Procedure: RIGHT KNEE ARTHROSCOPY WITH PARTIAL MEDIAL MENISCECTOMY;  Surgeon: Mcarthur Rossetti, MD;  Location: [facility];  Service: Orthopedics;  Laterality: Right;  . KNEE ARTHROSCOPY W/ MENISCAL REPAIR Bilateral   . MENISCUS REPAIR Left   . RADIOLOGY  WITH ANESTHESIA N/A 11/25/2019   Procedure: TIPS;  Surgeon: Corrie Mckusick, DO;  Location: [place];  Service: Anesthesiology;  Laterality: N/A;  . RADIOLOGY WITH ANESTHESIA N/A 01/14/2020   Procedure: TIPS;  Surgeon: Corrie Mckusick, DO;  Location: [place];  Service: Anesthesiology;  Laterality: N/A;    Prior to Admission medications   Medication Sig Start Date End Date Taking? Authorizing Provider  ACCU-CHEK FASTCLIX LANCETS MISC Check fsbs TID 08/09/16  Yes [provider]  acetaminophen (TYLENOL) 500 MG tablet Take 500-1,000 mg by mouth every 6 (six) hours as needed for moderate pain or headache.   Yes [provider]  AMBULATORY NON FORMULARY MEDICATION Wedge pillow 01/27/20  Yes Early, Coralee Pesa, NP  aMILoride (MIDAMOR) 5 MG tablet TAKE 4 TABLETS IN THE MORNING AND 2 TABLETS IN THE EVENING Patient taking differently: Take 10 mg by mouth in the morning and at bedtime. 09/03/20 09/03/21 Yes Levin Erp, PA  cetirizine (ZYRTEC) 10 MG tablet Take 1 tablet (10 mg total) by mouth daily. Patient taking differently: Take 10 mg by mouth daily as needed for allergies. 01/02/19  Yes [name], DO  Cholecalciferol (VITAMIN D3) 5000 UNITS TABS Take 5,000 Units by mouth daily.    Yes [provider]  ferrous sulfate 325 (65 FE) MG EC tablet Take 1 tablet (325 mg total) by mouth 2 (two) times daily with a meal. 09/26/18  Yes [name], DO  furosemide (LASIX) 20 MG tablet TAKE 1 TABLET (20 MG TOTAL) BY MOUTH 2 (TWO) TIMES DAILY. Patient taking differently: Take 20-40 mg by mouth See admin instructions. 40 mg in the morning and 20 mg in the evening 02/17/20 02/16/21 Yes Armbruster, Carlota Raspberry, MD  insulin degludec (TRESIBA) 200 UNIT/ML FlexTouch Pen INJECT 10 - 50 UNITS INTO THE SKIN DAILY. TARGET TO FASTING GLUCOSE 150. Patient taking differently: Inject 10-50 Units into the skin daily. 06/29/20 06/29/21 Yes Alexander, Lanelle Bal, DO  insulin lispro (HUMALOG) 200 UNIT/ML KwikPen INJECT 10 - 15 UNITS INTO THE SKIN 3 TIMES DAILY BEFORE MEALS Patient taking differently: Inject 10-15 Units into the skin with breakfast, with lunch, and with evening meal. 07/09/20 07/09/21 Yes [name], DO  Insulin Pen Needle 32G X 4 MM MISC USE TO INJECT TRESIBA 06/29/20 06/29/21 Yes [name], DO  Krill Oil 500 MG CAPS Take 1,000 mg by mouth 2 (two) times daily.   Yes [provider]  lactulose (CHRONULAC) 10 GM/15ML solution Take 15 mLs (10 g total) by mouth daily. Please keep your appt with Dr. Havery Moros on Thursday, 04-30-20. Patient taking differently: Take 10 g by mouth daily. 04/28/20  Yes Armbruster, Carlota Raspberry, MD  magnesium oxide (MAG-OX) 400 (240 Mg) MG tablet TAKE 1 TABLET BY MOUTH TWICE DAILY Patient taking differently: Take 400 mg by mouth 2 (two) times daily. 09/03/20 09/03/21 Yes [name], DO  Multiple Vitamin (MULTI-DAY PO) Take 1 tablet by mouth daily.   Yes [provider]  pantoprazole (PROTONIX) 40 MG tablet TAKE 1 TABLET (40 MG TOTAL) BY MOUTH DAILY. Patient taking differently: Take 40 mg by mouth daily. 02/17/20 02/16/21 Yes Armbruster, Carlota Raspberry, MD  Potassium Chloride ER 20 MEQ TBCR TAKE 1 TABLET BY MOUTH TWICE DAILY Patient taking  differently: Take 20 mEq by mouth 2 (two) times daily. 07/07/20 07/07/21 Yes Armbruster, Carlota Raspberry, MD  ciprofloxacin (CIPRO) 500 MG tablet TAKE 1 TABLET BY MOUTH ONCE DAILY Patient not taking: Reported on 09/15/2020 05/15/20 05/15/21  Armbruster, Carlota Raspberry, MD  ciprofloxacin (  CIPRO) 500 MG tablet TAKE 1 TABLET (500 MG TOTAL) BY MOUTH DAILY. Patient not taking: Reported on 09/15/2020 02/20/20 02/19/21  Levin Erp, PA  COVID-19 mRNA vaccine, Pfizer, 30 MCG/0.3ML injection INJECT AS DIRECTED Patient not taking: Reported on 09/15/2020 05/26/20 05/26/21  Carlyle Basques, MD    Scheduled Meds:  Infusions: . albumin human     PRN Meds:    Allergies as of 09/14/2020 - Review Complete 09/14/2020  Allergen Reaction Noted  . Penicillins Anaphylaxis 02/01/2013  . Atrovent [ipratropium] Palpitations 08/11/2017  . Naproxen Rash 02/01/2013  . Quinine derivatives Rash 04/14/2014  . Sulfa antibiotics Rash 02/01/2013    Family History  Problem Relation Age of Onset  . Hypertension Mother   . COPD Mother   . Alcoholism Father   . Heart attack Father   . Diabetes Brother   . Diabetes Paternal Grandmother   . Heart disease Paternal Grandmother   . Alcoholism Paternal Uncle   . Alcoholism Maternal Uncle   . Heart disease Maternal Grandmother   . Heart disease Maternal Grandfather   . Heart disease Paternal Grandfather     Social History   Socioeconomic History  . Marital status: Married    Spouse name: Noheli Melder  . Number of children: 1  . Years of education: Not on file  . Highest education level: Associate degree: academic program  Occupational History  . Not on file  Tobacco Use  . Smoking status: Never Smoker  . Smokeless tobacco: Never Used  Vaping Use  . Vaping Use: Never used  Substance and Sexual Activity  . Alcohol use: No  . Drug use: No  . Sexual activity: Yes    Partners: Male    Birth control/protection: None, Post-menopausal  Other Topics Concern  .  Not on file  Social History Narrative  . Not on file   Social Determinants of Health   Financial Resource Strain: Not on file  Food Insecurity: Not on file  Transportation Needs: Not on file  Physical Activity: Not on file  Stress: Not on file  Social Connections: Not on file  Intimate Partner Violence: Not on file    REVIEW OF SYSTEMS: Constitutional: Weakness. ENT:  No nose bleeds Pulm: Dyspnea on exertion.  No productive cough. CV:  No palpitations, no LE edema.  Left upper quadrant radiating to axilla pain is not exertional. GU:  No hematuria, no frequency.  No change in the color of her urine which is pale to medium yellow. GI: See HPI. Heme: Denies unusual or excessive bleeding or bruising. Transfusions:   Neuro:  No headaches, no peripheral tingling or numbness Derm:  No itching, no rash or sores.  Endocrine:  No sweats or chills.  No polyuria or dysuria.  Checks her sugars a few times a week and generally running 130s, 150s. Immunization: PRBC transfusion in 10/2019, 2 PRBCs in 01/2020. Travel:  None beyond local counties in last few months.    PHYSICAL EXAM: Vital signs in last 24 hours: Vitals:   09/15/20 1100 09/15/20 1115  BP: 124/71 (!) 129/57  Pulse: 86 83  Resp: 18 18  Temp:    SpO2: 100% 99%   Wt Readings from Last 3 Encounters:  07/09/20 67.9 kg  06/29/20 63.1 kg  06/26/20 62.1 kg    General: Patient looks chronically and somewhat acutely ill.  Currently comfortable on stretcher in the ED. Head: No facial asymmetry or swelling.  No signs of head trauma. Eyes: No scleral icterus.  EOMI Ears: No obvious hearing deficit. Nose: No congestion or discharge Mouth: Mucosa is moist, pink, clear.  Tongue midline.  Fair dentition. Neck: No JVD, no masses, no thyromegaly Lungs: Clear bilaterally without labored breathing.  No cough. Heart: RRR.  No MRG.  S1, S2 present. Abdomen: Soft.  Diffusely tender without guarding or rebound.  The only place she is not  tender is the epigastric area.  No HSM, masses, bruits, hernias, fluid waves.  Bowel sounds normal quality, active.   Rectal: Deferred Musc/Skeltl: No joint redness, swelling or significant deformities. Extremities: No CCE. Neurologic: Alert.  Appropriate.  Oriented x3.Marland Kitchen  No tremors, no asterixis.  Moves all 4 limbs. Skin: No rash, no sores, no suspicious lesions. Nodes: No cervical adenopathy Psych: Calm, pleasant, fluid speech, cooperative.  Intake/Output from previous day: 05/16 0701 - 05/17 0700 In: 2000 [IV Piggyback:2000] Out: -  Intake/Output this shift: No intake/output data recorded.  LAB RESULTS: Recent Labs    09/14/20 1948  WBC 6.7  HGB 12.4  HCT 33.9*  PLT 128*   BMET Lab Results  Component Value Date   NA 133 (L) 09/15/2020   NA 130 (L) 09/14/2020   NA 139 07/22/2020   K 4.3 09/15/2020   K 4.8 09/14/2020   K 4.0 07/22/2020   CL 106 09/15/2020   CL 99 09/14/2020   CL 109 07/22/2020   CO2 22 09/15/2020   CO2 21 (L) 09/14/2020   CO2 23 07/22/2020   GLUCOSE 217 (H) 09/15/2020   GLUCOSE 361 (H) 09/14/2020   GLUCOSE 107 (H) 07/22/2020   BUN 21 (H) 09/15/2020   BUN 23 (H) 09/14/2020   BUN 12 07/22/2020   CREATININE 1.48 (H) 09/15/2020   CREATININE 1.75 (H) 09/14/2020   CREATININE 1.18 07/22/2020   CALCIUM 9.6 09/15/2020   CALCIUM 10.4 (H) 09/14/2020   CALCIUM 8.8 07/22/2020   LFT Recent Labs    09/15/20 0209  PROT 5.7*  ALBUMIN 2.9*  AST 48*  ALT 30  ALKPHOS 104  BILITOT 4.5*  BILIDIR 1.5*  IBILI 3.0*   PT/INR Lab Results  Component Value Date   INR 1.3 (H) 06/25/2020   INR 1.2 (H) 01/27/2020   INR 1.6 (H) 01/14/2020   Hepatitis Panel No results for input(s): HEPBSAG, HCVAB, HEPAIGM, HEPBIGM in the last 72 hours. C-Diff No components found for: CDIFF Lipase     Component Value Date/Time   LIPASE 36 09/15/2020 0209    Drugs of Abuse  No results found for: LABOPIA, COCAINSCRNUR, LABBENZ, AMPHETMU, THCU, LABBARB   RADIOLOGY  STUDIES: CT ABDOMEN PELVIS WO CONTRAST  Result Date: 09/15/2020 CLINICAL DATA:  Acute abdominal pain, hernia suspected EXAM: CT ABDOMEN AND PELVIS WITHOUT CONTRAST TECHNIQUE: Multidetector CT imaging of the abdomen and pelvis was performed following the standard protocol without IV contrast. COMPARISON:  CT January 21, 2020 FINDINGS: Lower chest: No acute abnormality. Normal size heart. Mitral annular calcifications. Coronary artery calcifications. No significant pericardial effusion/thickening. Hepatobiliary: Cirrhotic hepatic morphology with tips stent in place. Embolization coils in the right lobe of the liver. Trace perihepatic ascites. Gallbladder is surgically absent. No biliary ductal dilation. Pancreas: Within normal limits Spleen: Borderline splenomegaly measuring 12.4 cm in maximum craniocaudal dimension Adrenals/Urinary Tract: Adrenal glands are unremarkable. Kidneys are normal, without renal calculi, focal lesion, or hydronephrosis. Bladder is unremarkable. Stomach/Bowel: Stomach is grossly unremarkable for degree of distension. Normal positioning of the duodenum/ligament of Treitz. No pathologic dilation of small bowel. Appendix grossly unremarkable. Moderate volume of formed stool throughout  the colon. Colonic diverticulosis without findings of acute diverticulitis. Vascular/Lymphatic: Aortic and branch vessel atherosclerosis without aneurysmal dilation. Prominent periportal and gastrohepatic lymph nodes measuring up to 9 mm without adenopathy by size criteria likely reactive no pathologically enlarged abdominal or pelvic lymph nodes. Reproductive: Uterus and bilateral adnexa are unremarkable. Other: Fat and nonobstructed small bowel containing ventral hernia with a 3.4 cm aperture width. Mild mesenteric stranding. Diffuse subcutaneous edema and overlying skin thickening. Musculoskeletal: No acute osseous abnormality. IMPRESSION: 1. Fat and nonobstructed small bowel containing ventral hernia with  a 3.4 cm aperture width. 2. Cirrhotic hepatic morphology status post TIPS with evidence of portal hypertension. 3. Colonic diverticulosis without findings of acute diverticulitis. 4. Moderate volume of formed stool throughout the colon. 5. Aortic atherosclerosis. Aortic Atherosclerosis (ICD10-I70.0). Electronically Signed   By: Dahlia Bailiff MD   On: 09/15/2020 03:44   DG Chest 2 View  Result Date: 09/14/2020 CLINICAL DATA:  One day chest pain EXAM: CHEST - 2 VIEW COMPARISON:  June 25, 2020 FINDINGS: The heart size and mediastinal contours are within normal limits. No focal consolidation. No pleural effusion. No pneumothorax. The visualized skeletal structures are unremarkable. Cholecystectomy clips. Upper abdominal embolization coils. TIPS stent overlies the right upper quadrant. IMPRESSION: No acute cardiopulmonary disease. Electronically Signed   By: Dahlia Bailiff MD   On: 09/14/2020 20:58   NM PULMONARY VENT AND PERF (V/Q Scan)  Result Date: 09/15/2020 CLINICAL DATA:  60 year old female with acute pain. Cirrhosis, TIPS. EXAM: NUCLEAR MEDICINE PERFUSION LUNG SCAN TECHNIQUE: Perfusion images were obtained in multiple projections after intravenous injection of radiopharmaceutical. Ventilation scans intentionally deferred if perfusion scan and chest x-ray adequate for interpretation during COVID 19 epidemic. RADIOPHARMACEUTICALS:  4.2 mCi Tc-33mMAA IV COMPARISON:  Noncontrast CT Abdomen and Pelvis 0323 hours today. Two-view chest radiographs 09/14/2020. FINDINGS: Homogeneous perfusion radiotracer activity throughout both lungs. No perfusion defect. IMPRESSION: Normal lung perfusion.  No evidence of pulmonary embolus. Electronically Signed   By: HGenevie AnnM.D.   On: 09/15/2020 07:53     IMPRESSION:   *   Lower abdominal and left upper quadrant radiating to axillary pain.  Nausea. Nonobstructing ventral/umbilical hernia.  Surgery not seeing any indication for intervention which would be high risk  under any circumstances. Ran out of SBP prophylactic Cipro about a week ago, only trace perihepatic ascites seen on CT scan.  *    NASH cirrhosis  *    DIPS 01/2020 to address refractory ascites. History of SBP.  Ran out of Cipro about a week ago.  Current CT showing trace perihepatic ascites.  *   AKI, improved with hydration.  *   Hx IDA.  On bid iron.  Current Hb 12.4, likely reflects element of volume contraction but improved from reading of 10.9 in late 06/2020.  *    thrombocytopenia.  Chronic.  Currently platelets better than on previous admissions.  *    Hyponatremia.  *   Previous banding of esophageal varices.  Latest EGD 01/2020 with small remnant esophageal varices.  APC treatment of nonbleeding gastric AVMs.    PLAN:     *   ?Pursue ultrasound to further assess for ascites?  *   Check INR  *   Resume Cipro 500 mg po daily.  *  U/A, r/o UTI.     SAzucena Freed 09/15/2020, 11:30 AM Phone [phone]

## 2020-09-15 NOTE — ED Notes (Signed)
Pt transported to CT ?

## 2020-09-15 NOTE — Discharge Instructions (Signed)
There is no evidence of heart attack or blood clot in the lung.  Follow-up with your primary doctor. He should also follow-up with the surgeon regarding your worsening hernia. Return to the ED with abdominal pain, exertional chest pain, fever, vomiting, shortness of breath or any other concerns.

## 2020-09-15 NOTE — ED Notes (Signed)
Lunch tray ordered 

## 2020-09-15 NOTE — ED Provider Notes (Signed)
[facility] EMERGENCY DEPARTMENT Provider Note   CSN: [id number] Arrival date & time: 09/14/20  1815     History Chief Complaint  Patient presents with  . Chest Pain    Meagan Peck is a 60 y.o. female.  Patient with a history of cirrhosis, esophageal varices status post TIPS, diabetes, GERD, hypertension presenting with a 1 week history of left-sided chest pain.  She describes pain and pressure left side of her chest, shoulder, upper back that is worse with palpation.  Pain is constant, nothing makes it better or worse.  Associated with some shortness of breath that is worse with exertion.  Nausea but no vomiting.  No sweating.  No fever.  Reports negative stress test about 5 years ago.  Denies cardiac history.  No leg pain or leg swelling.  Pain is somewhat worse when she takes a deep breath and worse when she has urged shoulder and chest wall palpated. Also has worsening of her chronic abdominal pain from her cirrhosis.  Has required paracentesis in the past.  Nausea but no vomiting.  No fever.  Has an umbilical hernia which she says is worsening.  No pain with urination or blood in the urine.  The history is provided by the patient.  Chest Pain Associated symptoms: abdominal pain, back pain, fatigue, nausea and shortness of breath   Associated symptoms: no dizziness, no fever, no palpitations, no vomiting and no weakness        Past Medical History:  Diagnosis Date  . Acute medial meniscus tear of right knee 12/10/2015  . Allergy   . Anasarca   . Arthritis    bil feet  . Ascites   . Cataract    bilateral - surgery to remove  . Cirrhosis ([place])   . Diabetes mellitus without complication ([place])    type 2 - last a1c was 5.2 per patient, no meds  . Dyspnea    when I have too much fluid  . Esophageal varices ([place])   . GERD (gastroesophageal reflux disease)   . Heart murmur   . Hepatic cirrhosis ([place])   . HLD (hyperlipidemia)    no meds  . Hypertension     no meds  . Iron deficiency anemia 09/26/2018    Patient Active Problem List   Diagnosis Date Noted  . Liver cirrhosis secondary to NASH ([place]) 01/27/2020  . Abnormal weight gain 01/27/2020  . Anasarca 01/27/2020  . Electrolyte disturbance 01/27/2020  . Hospital discharge follow-up 01/27/2020  . NASH (nonalcoholic steatohepatitis)   . Portal hypertension ([place])   . Angiodysplasia of upper gastrointestinal tract   . SBP (spontaneous bacterial peritonitis) ([place]) 01/08/2020  . Esophageal varices without bleeding ([place])   . Hypertension associated with diabetes ([place]) 08/16/2019  . Vitamin D deficiency 05/15/2017  . Iron deficiency 05/15/2017  . Breast screening declined 05/15/2017  . Colonoscopy refused 05/15/2017  . Choroidal nevus of both eyes 07/18/2016  . Corneal epithelial and basement membrane dystrophy 07/18/2016  . Thrombocytopenia ([place]) 04/14/2016  . Hyperlipidemia associated with type 2 diabetes mellitus ([place]) 05/25/2015  . Gastroesophageal reflux disease 05/25/2015  . Type 2 diabetes mellitus with other specified complication ([place]) 26/33/3545    Past Surgical History:  Procedure Laterality Date  . BIOPSY  08/20/2019   Procedure: BIOPSY;  Surgeon: Jackquline Denmark, MD;  Location: [facility];  Service: Gastroenterology;;  . CARPAL TUNNEL RELEASE Right   . CESAREAN SECTION    . CHOLECYSTECTOMY    . COLONOSCOPY  08/2019  . ESOPHAGEAL BANDING N/A 09/19/2019   Procedure: ESOPHAGEAL BANDING;  Surgeon: Yetta Flock, MD;  Location: [facility];  Service: Gastroenterology;  Laterality: N/A;  . ESOPHAGOGASTRODUODENOSCOPY (EGD) WITH PROPOFOL N/A 08/20/2019   Procedure: ESOPHAGOGASTRODUODENOSCOPY (EGD) WITH PROPOFOL;  Surgeon: Jackquline Denmark, MD;  Location: [facility];  Service: Gastroenterology;  Laterality: N/A;  . ESOPHAGOGASTRODUODENOSCOPY (EGD) WITH PROPOFOL N/A 09/19/2019   Procedure: ESOPHAGOGASTRODUODENOSCOPY (EGD) WITH PROPOFOL;  Surgeon: Yetta Flock, MD;   Location: [facility];  Service: Gastroenterology;  Laterality: N/A;  . ESOPHAGOGASTRODUODENOSCOPY (EGD) WITH PROPOFOL N/A 01/16/2020   Procedure: ESOPHAGOGASTRODUODENOSCOPY (EGD) WITH PROPOFOL;  Surgeon: Jerene Bears, MD;  Location: [facility];  Service: Endoscopy;  Laterality: N/A;  . EYE SURGERY Bilateral    removed cataracts  . GASTRIC VARICES BANDING N/A 08/20/2019   Procedure: GASTRIC VARICES BANDING;  Surgeon: Jackquline Denmark, MD;  Location: [facility];  Service: Gastroenterology;  Laterality: N/A;  . HOT HEMOSTASIS N/A 01/16/2020   Procedure: HOT HEMOSTASIS (ARGON PLASMA COAGULATION/BICAP);  Surgeon: Jerene Bears, MD;  Location: [facility] END[facility]COPY;  Service: Endoscopy;  Laterality: N/A;  . IR PARACENTESIS  08/01/2019  . IR PARACENTESIS  08/19/2019  . IR PARACENTESIS  09/09/2019  . IR PARACENTESIS  09/23/2019  . IR PARACENTESIS  10/08/2019  . IR PARACENTESIS  10/23/2019  . IR PARACENTESIS  11/12/2019  . IR PARACENTESIS  11/21/2019  . IR PARACENTESIS  11/25/2019  . IR PARACENTESIS  12/05/2019  . IR PARACENTESIS  12/13/2019  . IR PARACENTESIS  12/20/2019  . IR PARACENTESIS  12/26/2019  . IR PARACENTESIS  12/31/2019  . IR PARACENTESIS  01/07/2020  . IR PARACENTESIS  01/10/2020  . IR PARACENTESIS  01/13/2020  . IR PARACENTESIS  01/14/2020  . IR PARACENTESIS  01/20/2020  . IR PARACENTESIS  01/30/2020  . IR PARACENTESIS  02/24/2020  . IR RADIOLOGIST EVAL & MGMT  10/30/2019  . IR RADIOLOGIST EVAL & MGMT  12/11/2019  . IR RADIOLOGIST EVAL & MGMT  12/24/2019  . IR RADIOLOGIST EVAL & MGMT  03/05/2020  . IR RADIOLOGIST EVAL & MGMT  05/15/2020  . IR TIPS  11/25/2019  . IR TIPS  01/14/2020  . KNEE ARTHROSCOPY Right 12/10/2015   Procedure: RIGHT KNEE ARTHROSCOPY WITH PARTIAL MEDIAL MENISCECTOMY;  Surgeon: Mcarthur Rossetti, MD;  Location: [facility];  Service: Orthopedics;  Laterality: Right;  . KNEE ARTHROSCOPY W/ MENISCAL REPAIR Bilateral   . MENISCUS REPAIR Left   . RADIOLOGY WITH ANESTHESIA N/A 11/25/2019    Procedure: TIPS;  Surgeon: Corrie Mckusick, DO;  Location: [facility];  Service: Anesthesiology;  Laterality: N/A;  . RADIOLOGY WITH ANESTHESIA N/A 01/14/2020   Procedure: TIPS;  Surgeon: Corrie Mckusick, DO;  Location: [facility];  Service: Anesthesiology;  Laterality: N/A;     OB History   No obstetric history on file.     Family History  Problem Relation Age of Onset  . Hypertension Mother   . COPD Mother   . Alcoholism Father   . Heart attack Father   . Diabetes Brother   . Diabetes Paternal Grandmother   . Heart disease Paternal Grandmother   . Alcoholism Paternal Uncle   . Alcoholism Maternal Uncle   . Heart disease Maternal Grandmother   . Heart disease Maternal Grandfather   . Heart disease Paternal Grandfather     Social History   Tobacco Use  . Smoking status: Never Smoker  . Smokeless tobacco: Never Used  Vaping Use  . Vaping Use: Never used  Substance Use Topics  . Alcohol use: No  . Drug use: No    Home Medications Prior to Admission medications   Medication Sig Start Date End Date Taking? Authorizing Provider  ACCU-CHEK FASTCLIX LANCETS MISC Check fsbs TID 08/09/16   [provider]  AMBULATORY NON FORMULARY MEDICATION Wedge pillow 01/27/20   Early, Coralee Pesa, NP  aMILoride (MIDAMOR) 5 MG tablet TAKE 4 TABLETS IN THE MORNING AND 2 TABLETS IN THE EVENING 09/03/20 09/03/21  Levin Erp, PA  cetirizine (ZYRTEC) 10 MG tablet Take 1 tablet (10 mg total) by mouth daily. Patient taking differently: Take 10 mg by mouth daily as needed for allergies. 01/02/19   [name], DO  Cholecalciferol (VITAMIN D3) 5000 UNITS TABS Take 5,000 Units by mouth daily.     [provider]  ciprofloxacin (CIPRO) 500 MG tablet TAKE 1 TABLET BY MOUTH ONCE DAILY 05/15/20 05/15/21  Armbruster, Carlota Raspberry, MD  ciprofloxacin (CIPRO) 500 MG tablet TAKE 1 TABLET (500 MG TOTAL) BY MOUTH DAILY. 02/20/20 02/19/21  Levin Erp, PA  COVID-19 mRNA vaccine, Pfizer, 30  MCG/0.3ML injection INJECT AS DIRECTED 05/26/20 05/26/21  Carlyle Basques, MD  ferrous sulfate 325 (65 FE) MG EC tablet Take 1 tablet (325 mg total) by mouth 2 (two) times daily with a meal. 09/26/18   [name], DO  furosemide (LASIX) 20 MG tablet TAKE 2 TABLETS BY MOUTH EVERY MORNING AND 1 TABLET EVERY NIGHT AT BEDTIME Patient taking differently: Take 40 mg (2 tablets) by mouth every morning and 40 mg (2 tablet) by mouth at bedtime 06/15/20 06/15/21  Armbruster, Carlota Raspberry, MD  furosemide (LASIX) 20 MG tablet TAKE 1 TABLET (20 MG TOTAL) BY MOUTH 2 (TWO) TIMES DAILY. 02/17/20 02/16/21  Armbruster, Carlota Raspberry, MD  insulin degludec (TRESIBA) 200 UNIT/ML FlexTouch Pen INJECT 10 - 50 UNITS INTO THE SKIN DAILY. T[employer] TO FASTING GLUCOSE 150. 06/29/20 06/29/21  [name], DO  insulin lispro (HUMALOG) 200 UNIT/ML KwikPen INJECT 10 - 15 UNITS INTO THE SKIN 3 TIMES DAILY BEFORE MEALS 07/09/20 07/09/21  [name], DO  Insulin Pen Needle 32G X 4 MM MISC USE TO INJECT TRESIBA 06/29/20 06/29/21  [name], DO  Krill Oil 500 MG CAPS Take 1,000 mg by mouth 2 (two) times daily.    [provider]  lactulose (CHRONULAC) 10 GM/15ML solution Take 15 mLs (10 g total) by mouth daily. Please keep your appt with Dr. Havery Moros on Thursday, 04-30-20. 04/28/20   Armbruster, Carlota Raspberry, MD  lactulose, encephalopathy, (CHRONULAC) 10 GM/15ML SOLN TAKE 15MLS BY MOUTH DAILY. **PLEASE KEEP 04/30/20 APPOINTMENT** 04/28/20 04/28/21  Yetta Flock, MD  magnesium oxide (MAG-OX) 400 (240 Mg) MG tablet TAKE 1 TABLET BY MOUTH TWICE DAILY 09/03/20 09/03/21  [name], DO  pantoprazole (PROTONIX) 40 MG tablet TAKE 1 TABLET (40 MG TOTAL) BY MOUTH DAILY. 02/17/20 02/16/21  Armbruster, Carlota Raspberry, MD  Potassium Chloride ER 20 MEQ TBCR TAKE 1 TABLET BY MOUTH TWICE DAILY 07/07/20 07/07/21  Armbruster, Carlota Raspberry, MD  Potassium Chloride ER 20 MEQ TBCR TAKE 1 TABLET BY MOUTH TWICE DAILY 04/28/20 04/28/21   Armbruster, Carlota Raspberry, MD  potassium chloride SA (KLOR-CON) 20 MEQ tablet Take 20 mEq by mouth 2 (two) times daily.    [provider]    Allergies    Penicillins, Atrovent [ipratropium], Naproxen, Quinine derivatives, and Sulfa antibiotics  Review of Systems   Review of Systems  Constitutional: Positive for fatigue. Negative for activity change, appetite change and fever.  HENT: Negative for congestion and rhinorrhea.   Respiratory: Positive for chest tightness and shortness of breath.   Cardiovascular: Positive for chest pain. Negative for palpitations.  Gastrointestinal: Positive for abdominal pain and nausea. Negative for vomiting.  Genitourinary: Negative for dysuria and hematuria.  Musculoskeletal: Positive for back pain. Negative for arthralgias and myalgias.  Neurological: Negative for dizziness and weakness.   all other systems are negative except as noted in the HPI and PMH.    Physical Exam Updated Vital Signs BP 138/61 (BP Location: Left Arm)   Pulse 75   Temp 97.8 F (36.6 C) (Oral)   Resp 14   LMP 12/03/2013 (LMP Unknown)   SpO2 98%   Physical Exam Vitals and nursing note reviewed.  Constitutional:      General: She is not in acute distress.    Appearance: She is well-developed.  HENT:     Head: Normocephalic and atraumatic.     Mouth/Throat:     Pharynx: No oropharyngeal exudate.  Eyes:     Conjunctiva/sclera: Conjunctivae normal.     Pupils: Pupils are equal, round, and reactive to light.  Neck:     Comments: No meningismus. Cardiovascular:     Rate and Rhythm: Normal rate and regular rhythm.     Heart sounds: Normal heart sounds. No murmur heard.   Pulmonary:     Effort: Pulmonary effort is normal. No respiratory distress.     Breath sounds: Normal breath sounds.     Comments: Reproducible chest wall tenderness, no rash Chest:     Chest wall: Tenderness present.  Abdominal:     Palpations: Abdomen is soft.     Tenderness: There is  abdominal tenderness. There is no guarding or rebound.     Comments: Diffuse tenderness, no guarding or rebound.  Purple discoloration of umbilical hernia.  It is soft to palpation.  Musculoskeletal:        General: No tenderness. Normal range of motion.     Cervical back: Normal range of motion and neck supple.  Skin:    General: Skin is warm.  Neurological:     General: No focal deficit present.     Mental Status: She is alert and oriented to person, place, and time. Mental status is at baseline.     Cranial Nerves: No cranial nerve deficit.     Motor: No abnormal muscle tone.     Coordination: Coordination normal.     Comments: No ataxia on finger to nose bilaterally. No pronator drift. 5/5 strength throughout. CN 2-12 intact.Equal grip strength. Sensation intact.   Psychiatric:        Behavior: Behavior normal.       ED Results / Procedures / Treatments   Labs (all labs ordered are listed, but only abnormal results are displayed) Labs Reviewed  BASIC METABOLIC PANEL - Abnormal; Notable for the following components:      Result Value   Sodium 130 (*)    CO2 21 (*)    Glucose, Bld 361 (*)    BUN 23 (*)    Creatinine, Ser 1.75 (*)    Calcium 10.4 (*)    GFR, Estimated 34 (*)    All other components within normal limits  CBC - Abnormal; Notable for the following components:   RBC 3.81 (*)    HCT 33.9 (*)    MCHC 36.6 (*)    Platelets 128 (*)    All other components within normal limits  HEPATIC FUNCTION PANEL - Abnormal;  Notable for the following components:   Total Protein 5.7 (*)    Albumin 2.9 (*)    AST 48 (*)    Total Bilirubin 4.5 (*)    Bilirubin, Direct 1.5 (*)    Indirect Bilirubin 3.0 (*)    All other components within normal limits  D-DIMER, QUANTITATIVE - Abnormal; Notable for the following components:   D-Dimer, Quant 0.76 (*)    All other components within normal limits  BASIC METABOLIC PANEL - Abnormal; Notable for the following components:    Sodium 133 (*)    Glucose, Bld 217 (*)    BUN 21 (*)    Creatinine, Ser 1.48 (*)    GFR, Estimated 41 (*)    All other components within normal limits  TROPONIN I (HIGH SENSITIVITY) - Abnormal; Notable for the following components:   Troponin I (High Sensitivity) 18 (*)    All other components within normal limits  LIPASE, BLOOD  I-STAT BETA HCG BLOOD, ED ([facility], [facility], [facility] ONLY)  TROPONIN I (HIGH SENSITIVITY)  TROPONIN I (HIGH SENSITIVITY)  TROPONIN I (HIGH SENSITIVITY)    EKG EKG Interpretation  Date/Time:  Monday Sep 14 2020 19:48:14 EDT Ventricular Rate:  79 PR Interval:  146 QRS Duration: 122 QT Interval:  366 QTC Calculation: 419 R Axis:   76 Text Interpretation: Normal sinus rhythm Cannot rule out Anterior infarct , age undetermined Abnormal ECG No significant change was found Confirmed by Ezequiel Essex [id number]) on 09/14/2020 11:55:43 PM   Radiology CT ABDOMEN PELVIS WO CONTRAST  Result Date: 09/15/2020 CLINICAL DATA:  Acute abdominal pain, hernia suspected EXAM: CT ABDOMEN AND PELVIS WITHOUT CONTRAST TECHNIQUE: Multidetector CT imaging of the abdomen and pelvis was performed following the standard protocol without IV contrast. COMPARISON:  CT January 21, 2020 FINDINGS: Lower chest: No acute abnormality. Normal size heart. Mitral annular calcifications. Coronary artery calcifications. No significant pericardial effusion/thickening. Hepatobiliary: Cirrhotic hepatic morphology with tips stent in place. Embolization coils in the right lobe of the liver. Trace perihepatic ascites. Gallbladder is surgically absent. No biliary ductal dilation. Pancreas: Within normal limits Spleen: Borderline splenomegaly measuring 12.4 cm in maximum craniocaudal dimension Adrenals/Urinary Tract: Adrenal glands are unremarkable. Kidneys are normal, without renal calculi, focal lesion, or hydronephrosis. Bladder is unremarkable. Stomach/Bowel: Stomach is grossly unremarkable for degree of distension.  Normal positioning of the duodenum/ligament of [place]. No pathologic dilation of small bowel. Appendix grossly unremarkable. Moderate volume of formed stool throughout the colon. Colonic diverticulosis without findings of acute diverticulitis. Vascular/Lymphatic: Aortic and branch vessel atherosclerosis without aneurysmal dilation. Prominent periportal and gastrohepatic lymph nodes measuring up to 9 mm without adenopathy by size criteria likely reactive no pathologically enlarged abdominal or pelvic lymph nodes. Reproductive: Uterus and bilateral adnexa are unremarkable. Other: Fat and nonobstructed small bowel containing ventral hernia with a 3.4 cm aperture width. Mild mesenteric stranding. Diffuse subcutaneous edema and overlying skin thickening. Musculoskeletal: No acute osseous abnormality. IMPRESSION: 1. Fat and nonobstructed small bowel containing ventral hernia with a 3.4 cm aperture width. 2. Cirrhotic hepatic morphology status post TIPS with evidence of portal hypertension. 3. Colonic diverticulosis without findings of acute diverticulitis. 4. Moderate volume of formed stool throughout the colon. 5. Aortic atherosclerosis. Aortic Atherosclerosis (ICD10-I70.0). Electronically Signed   By: Dahlia Bailiff MD   On: 09/15/2020 03:44   DG Chest 2 View  Result Date: 09/14/2020 CLINICAL DATA:  One day chest pain EXAM: CHEST - 2 VIEW COMPARISON:  June 25, 2020 FINDINGS: The heart size and mediastinal contours are  within normal limits. No focal consolidation. No pleural effusion. No pneumothorax. The visualized skeletal structures are unremarkable. Cholecystectomy clips. Upper abdominal embolization coils. TIPS stent overlies the right upper quadrant. IMPRESSION: No acute cardiopulmonary disease. Electronically Signed   By: Dahlia Bailiff MD   On: 09/14/2020 20:58    Procedures Procedures   Medications Ordered in ED Medications  sodium chloride 0.9 % bolus 1,000 mL (has no administration in time  range)  ondansetron (ZOFRAN) injection 4 mg (has no administration in time range)    ED Course  I have reviewed the triage vital signs and the nursing notes.  Pertinent labs & imaging results that were available during my care of the patient were reviewed by me and considered in my medical decision making (see chart for details).    MDM Rules/Calculators/A&P                         History of cirrhosis and diabetes here with 1 week of constant chest pain, shortness of breath, worse with inspiration and movement.  EKG without acute ischemia.  Chest pain is reproducible to palpation  Low risk stress test in 2019.  Troponin negative x2.  Chest x-ray is negative.  Low suspicion for ACS.  Chest pain is reproducible to palpation and movement. It is somewhat pleuritic and will check D-dimer.  D-dimer elevated. Given AKI and contrast shortage, patient will need VQ scan to rule out pulmonary embolism.  Patient complains of ongoing pain to her umbilical hernia.  This area is soft but tender.  She says it changed colors over the past several weeks. CT scan today shows no evidence of obstruction or strangulation, some stranding and skin thickening on CT.  Discussed with Dr. Barry Dienes of general surgery.  She will have someone evaluate the hernia this morning.  Patient has a lot of pain in the area and she states the colors are changing. there is some stranding on the CT scan but no evidence of obstruction.  VQ scan pending at shift change as well as surgical evaluation of umbilical hernia.  Dr. Vanita Panda to assume care.   Final Clinical Impression(s) / ED Diagnoses Final diagnoses:  None    Rx / DC Orders ED Discharge Orders    None       Jackob Crookston, Annie Main, MD 09/15/20 [phone]

## 2020-09-15 NOTE — ED Notes (Signed)
Pt transported to [facility].

## 2020-09-16 ENCOUNTER — Other Ambulatory Visit: Payer: Self-pay

## 2020-09-16 DIAGNOSIS — R1030 Lower abdominal pain, unspecified: Secondary | ICD-10-CM

## 2020-09-16 DIAGNOSIS — R0789 Other chest pain: Principal | ICD-10-CM

## 2020-09-16 LAB — CBC
HCT: 27.8 % — ABNORMAL LOW (ref 36.0–46.0)
Hemoglobin: 10 g/dL — ABNORMAL LOW (ref 12.0–15.0)
MCH: 32.4 pg (ref 26.0–34.0)
MCHC: 36 g/dL (ref 30.0–36.0)
MCV: 90 fL (ref 80.0–100.0)
Platelets: UNDETERMINED 10*3/uL (ref 150–400)
RBC: 3.09 MIL/uL — ABNORMAL LOW (ref 3.87–5.11)
RDW: 14.6 % (ref 11.5–15.5)
WBC: 6.3 10*3/uL (ref 4.0–10.5)
nRBC: 0 % (ref 0.0–0.2)

## 2020-09-16 LAB — COMPREHENSIVE METABOLIC PANEL
ALT: 25 U/L (ref 0–44)
AST: 36 U/L (ref 15–41)
Albumin: 2.6 g/dL — ABNORMAL LOW (ref 3.5–5.0)
Alkaline Phosphatase: 87 U/L (ref 38–126)
Anion gap: 6 (ref 5–15)
BUN: 20 mg/dL (ref 6–20)
CO2: 20 mmol/L — ABNORMAL LOW (ref 22–32)
Calcium: 9.7 mg/dL (ref 8.9–10.3)
Chloride: 107 mmol/L (ref 98–111)
Creatinine, Ser: 1.54 mg/dL — ABNORMAL HIGH (ref 0.44–1.00)
GFR, Estimated: 39 mL/min — ABNORMAL LOW (ref >60)
Glucose, Bld: 124 mg/dL — ABNORMAL HIGH (ref 70–99)
Potassium: 4.5 mmol/L (ref 3.5–5.1)
Sodium: 133 mmol/L — ABNORMAL LOW (ref 135–145)
Total Bilirubin: 3.9 mg/dL — ABNORMAL HIGH (ref 0.3–1.2)
Total Protein: 5 g/dL — ABNORMAL LOW (ref 6.5–8.1)

## 2020-09-16 LAB — GLUCOSE, CAPILLARY
Glucose-Capillary: 140 mg/dL — ABNORMAL HIGH (ref 70–99)
Glucose-Capillary: 223 mg/dL — ABNORMAL HIGH (ref 70–99)
Glucose-Capillary: 173 mg/dL — ABNORMAL HIGH (ref 70–99)
Glucose-Capillary: 240 mg/dL — ABNORMAL HIGH (ref 70–99)

## 2020-09-16 LAB — PROTIME-INR
INR: 1.5 — ABNORMAL HIGH (ref 0.8–1.2)
Prothrombin Time: 18.2 seconds — ABNORMAL HIGH (ref 11.4–15.2)

## 2020-09-16 MED ORDER — VALACYCLOVIR HCL 500 MG PO TABS
1000.0000 mg | ORAL_TABLET | Freq: Three times a day (TID) | ORAL | Status: DC
  Administered 2020-09-16 – 2020-09-19 (×9): 1000 mg via ORAL
  Filled 2020-09-16 (×11): qty 2

## 2020-09-16 MED ORDER — ALBUMIN HUMAN 25 % IV SOLN
12.5000 g | Freq: Once | INTRAVENOUS | Status: DC
  Filled 2020-09-16: qty 50

## 2020-09-16 MED ORDER — LACTULOSE 10 GM/15ML PO SOLN
10.0000 g | Freq: Two times a day (BID) | ORAL | Status: DC
  Administered 2020-09-16 – 2020-09-18 (×4): 10 g via ORAL
  Filled 2020-09-16 (×4): qty 15

## 2020-09-16 NOTE — Progress Notes (Addendum)
Daily Rounding Note  09/16/2020, 10:25 AM  LOS: 1 day   SUBJECTIVE:   Chief complaint:    Left upper quadrant/axillary pain and lower abdominal pain.  Overall pain is better especially in the lower abdomen but the pain in the left upper quadrant/flank/axillary region continues.  She denies having lifted anything heavy recently or new activities that may have led to musculoskeletal discomfort. Large bowel movement after enema yesterday evening. She had been taking her lactulose as prescribed all along but developed constipation acutely several days PTA.  However lactulose is not on her outpatient med list Ate most of her breakfast  OBJECTIVE:         Vital signs in last 24 hours:    Temp:  [98 F (36.7 C)-98.9 F (37.2 C)] 98.9 F (37.2 C) (05/18 0802) Pulse Rate:  [66-101] 80 (05/18 0802) Resp:  [11-26] 18 (05/18 0802) BP: (102-145)/(48-89) 135/62 (05/18 0802) SpO2:  [98 %-100 %] 100 % (05/18 0802) Weight:  [57.7 kg-57.8 kg] 57.8 kg (05/18 0648) Last BM Date: 09/15/20 Filed Weights   09/15/20 1938 09/16/20 0648  Weight: 57.7 kg 57.8 kg   General: Looks better, more comfortable.  Alert.  Somewhat chronically ill-appearing.  Mild icterus. Heart: RRR. Chest: Clear bilaterally without labored breathing or cough Abdomen: Nondistended.  Soft.  Active bowel sounds.  Still tender in the left upper quadrant and into the axilla, seems more of a musculoskeletal/rib type discomfort.  Still some tenderness in the lower abdomen which is mild. Extremities: No CCE Neuro/Psych: Alert.  Oriented x3.  No asterixis or tremors.  Fluid speech.  Intake/Output from previous day: 05/17 0701 - 05/18 0700 In: -  Out: 600 [Urine:600]  Intake/Output this shift: No intake/output data recorded.  Lab Results: Recent Labs    09/14/20 1948 09/16/20 0350  WBC 6.7 6.3  HGB 12.4 10.0*  HCT 33.9* 27.8*  PLT 128* PLATELET CLUMPS NOTED ON  SMEAR, UNABLE TO ESTIMATE   BMET Recent Labs    09/14/20 1948 09/15/20 0500 09/16/20 0350  NA 130* 133* 133*  K 4.8 4.3 4.5  CL 99 106 107  CO2 21* 22 20*  GLUCOSE 361* 217* 124*  BUN 23* 21* 20  CREATININE 1.75* 1.48* 1.54*  CALCIUM 10.4* 9.6 9.7   LFT Recent Labs    09/15/20 0209 09/16/20 0350  PROT 5.7* 5.0*  ALBUMIN 2.9* 2.6*  AST 48* 36  ALT 30 25  ALKPHOS 104 87  BILITOT 4.5* 3.9*  BILIDIR 1.5*  --   IBILI 3.0*  --    PT/INR Recent Labs    09/15/20 1236 09/16/20 0350  LABPROT 18.3* 18.2*  INR 1.5* 1.5*   Hepatitis Panel No results for input(s): HEPBSAG, HCVAB, HEPAIGM, HEPBIGM in the last 72 hours.  Studies/Results: CT ABDOMEN PELVIS WO CONTRAST  Result Date: 09/15/2020 CLINICAL DATA:  Acute abdominal pain, hernia suspected EXAM: CT ABDOMEN AND PELVIS WITHOUT CONTRAST TECHNIQUE: Multidetector CT imaging of the abdomen and pelvis was performed following the standard protocol without IV contrast. COMPARISON:  CT January 21, 2020 FINDINGS: Lower chest: No acute abnormality. Normal size heart. Mitral annular calcifications. Coronary artery calcifications. No significant pericardial effusion/thickening. Hepatobiliary: Cirrhotic hepatic morphology with tips stent in place. Embolization coils in the right lobe of the liver. Trace perihepatic ascites. Gallbladder is surgically absent. No biliary ductal dilation. Pancreas: Within normal limits Spleen: Borderline splenomegaly measuring 12.4 cm in maximum craniocaudal dimension Adrenals/Urinary Tract: Adrenal glands are unremarkable. Kidneys  are normal, without renal calculi, focal lesion, or hydronephrosis. Bladder is unremarkable. Stomach/Bowel: Stomach is grossly unremarkable for degree of distension. Normal positioning of the duodenum/ligament of [place]. No pathologic dilation of small bowel. Appendix grossly unremarkable. Moderate volume of formed stool throughout the colon. Colonic diverticulosis without findings of  acute diverticulitis. Vascular/Lymphatic: Aortic and branch vessel atherosclerosis without aneurysmal dilation. Prominent periportal and gastrohepatic lymph nodes measuring up to 9 mm without adenopathy by size criteria likely reactive no pathologically enlarged abdominal or pelvic lymph nodes. Reproductive: Uterus and bilateral adnexa are unremarkable. Other: Fat and nonobstructed small bowel containing ventral hernia with a 3.4 cm aperture width. Mild mesenteric stranding. Diffuse subcutaneous edema and overlying skin thickening. Musculoskeletal: No acute osseous abnormality. IMPRESSION: 1. Fat and nonobstructed small bowel containing ventral hernia with a 3.4 cm aperture width. 2. Cirrhotic hepatic morphology status post TIPS with evidence of portal hypertension. 3. Colonic diverticulosis without findings of acute diverticulitis. 4. Moderate volume of formed stool throughout the colon. 5. Aortic atherosclerosis. Aortic Atherosclerosis (ICD10-I70.0). Electronically Signed   By: Dahlia Bailiff MD   On: 09/15/2020 03:44   DG Chest 2 View  Result Date: 09/14/2020 CLINICAL DATA:  One day chest pain EXAM: CHEST - 2 VIEW COMPARISON:  June 25, 2020 FINDINGS: The heart size and mediastinal contours are within normal limits. No focal consolidation. No pleural effusion. No pneumothorax. The visualized skeletal structures are unremarkable. Cholecystectomy clips. Upper abdominal embolization coils. TIPS stent overlies the right upper quadrant. IMPRESSION: No acute cardiopulmonary disease. Electronically Signed   By: Dahlia Bailiff MD   On: 09/14/2020 20:58   NM PULMONARY VENT AND PERF (V/Q Scan)  Result Date: 09/15/2020 CLINICAL DATA:  60 year old female with acute pain. Cirrhosis, TIPS. EXAM: NUCLEAR MEDICINE PERFUSION LUNG SCAN TECHNIQUE: Perfusion images were obtained in multiple projections after intravenous injection of radiopharmaceutical. Ventilation scans intentionally deferred if perfusion scan and chest  x-ray adequate for interpretation during COVID 19 epidemic. RADIOPHARMACEUTICALS:  4.2 mCi Tc-62mMAA IV COMPARISON:  Noncontrast CT Abdomen and Pelvis 0323 hours today. Two-view chest radiographs 09/14/2020. FINDINGS: Homogeneous perfusion radiotracer activity throughout both lungs. No perfusion defect. IMPRESSION: Normal lung perfusion.  No evidence of pulmonary embolus. Electronically Signed   By: HGenevie AnnM.D.   On: 09/15/2020 07:53   ECHOCARDIOGRAM COMPLETE  Result Date: 09/15/2020 IMPRESSIONS  1. Left ventricular ejection fraction, by estimation, is 60 to 65%. The left ventricle has normal function. The left ventricle has no regional wall motion abnormalities. There is moderate concentric left ventricular hypertrophy. Left ventricular diastolic parameters are consistent with Grade II diastolic dysfunction (pseudonormalization). Elevated left atrial pressure.  2. Right ventricular systolic function is normal. The right ventricular size is normal.  3. Left atrial size was moderately dilated.  4. The mitral valve is abnormal. No evidence of mitral valve regurgitation. The mean mitral valve gradient is 3.0 mmHg with average heart rate of 75 bpm. Moderate mitral annular calcification.  5. The aortic valve is tricuspid. Aortic valve regurgitation is not visualized. Mild aortic valve sclerosis is present, with no evidence of aortic valve stenosis. Comparison(s): A prior study was performed on 01/15/2020. Prior images reviewed side by side. Similar to prior; tissues Doppler is diminished from prior.  Electronically signed by MRudean HaskellMD Signature Date/Time: 09/15/2020/5:21:05 PM    Final    Scheduled Meds: . aMILoride  10 mg Oral Daily  . ciprofloxacin  500 mg Oral Q breakfast  . insulin aspart  0-15 Units Subcutaneous TID WC  .  lactulose  10 g Oral Daily   Continuous Infusions: . albumin human     PRN Meds:.morphine injection, ondansetron **OR** ondansetron (ZOFRAN) IV   ASSESMENT:   *    Lower abdominal and left upper quadrant radiating to axillary pain.  Nausea. Nonobstructing ventral/umbilical hernia.  Surgery not seeing indication for intervention which would be high risk under any circumstances. Ran out of SBP prophylactic Cipro about a week ago, only trace perihepatic ascites seen on CT scan. Pain improved after bowel movements following enema.  *    NASH cirrhosis.    *    DIPS 01/2020 to address refractory ascites. History SBP.  Ran out of Cipro a week ago.  Current CT showing trace perihepatic ascites. Cipro restarted.    *   AKI, improved with hydration, not resolved.  *   Hx IDA.  On bid iron.  Hgb 12.4 >> 10 following hydration, 10.9 in late 06/2020.  *    Thrombocytopenia.  Chronic.  Current platelets better than previous.  *   Coagulopathy.  INR 1.5  *    Hyponatremia. Na stable @ 133.    *   Previous banding of esophageal varices.  Latest EGD 01/2020 with small remnant esophageal varices.  APC treatment of nonbleeding gastric AVMs.    PLAN   *   Ordered paracentesis.  Preliminary ultrasound will determine whether or not she has ascites significant enough for tap. Ordered fluid studies for SBP if she is tapped. Ordered IV albumin infusion if she is tapped.  *   Increase lactulose to twice daily    Azucena Freed  09/16/2020, 10:25 AM Phone [phone]

## 2020-09-16 NOTE — TOC Initial Note (Signed)
Transition of Care [facility]) - Initial/Assessment Note    Patient Details  Name: Meagan Peck MRN: [id number] Date of Birth: 09-Oct-1960  [facility] CM/SW Contact:    Carles Collet, RN Phone Number: 09/16/2020, 12:19 PM  Clinical Narrative:            Meagan Peck w patient at bedside. Verified demographics and phone number. She states that she lives at home w her spouse, he is out of work, pending disability. She confirms that she is uninsured, email sent to [facility] to assist. She states that they pay cash for prescriptions, utilizing their 401K, not much money left. Sent her the [employer] app to her cell phone, and demonstrated use. She was appreciative. She states that utilizes [facility] for PCP, she goes every few months and is an active patient, Epic shows communication with the office in March. She drives and has car for transportation. TOC will continue to follow for discharge needs.          Expected Discharge Plan: Home/Self Care Barriers to Discharge: Continued Medical Work up   Patient Goals and CMS Choice Patient states their goals for this hospitalization and ongoing recovery are:: return home      Expected Discharge Plan and Services Expected Discharge Plan: Home/Self Care   Discharge Planning Services: CM Consult                                          Prior Living Arrangements/Services   Lives with:: Spouse              Current home services: DME (cane)    Activities of Daily Living Home Assistive Devices/Equipment: Cane (specify quad or straight) (quad) ADL Screening (condition at time of admission) [name] cognitive ability adequate to safely complete daily activities?: Yes Is the patient deaf or have difficulty hearing?: Yes Does the patient have difficulty seeing, even when wearing glasses/contacts?: No Does the patient have difficulty concentrating, remembering, or making decisions?:  Yes Patient able to express need for assistance with ADLs?: Yes Does the patient have difficulty dressing or bathing?: No Independently performs ADLs?: Yes (appropriate for developmental age) Does the patient have difficulty walking or climbing stairs?: Yes Weakness of Legs: Both Weakness of Arms/Hands: Both  Permission Sought/Granted                  Emotional Assessment              Admission diagnosis:  Atypical chest pain [R07.89] Chest pain [J19.4] Umbilical hernia without obstruction and without gangrene [K42.9] Patient Active Problem List   Diagnosis Date Noted  . Liver cirrhosis secondary to NASH ([place]) 01/27/2020  . Abnormal weight gain 01/27/2020  . Anasarca 01/27/2020  . Electrolyte disturbance 01/27/2020  . Hospital discharge follow-up 01/27/2020  . NASH (nonalcoholic steatohepatitis)   . Portal hypertension ([place])   . Angiodysplasia of upper gastrointestinal tract   . SBP (spontaneous bacterial peritonitis) ([place]) 01/08/2020  . Esophageal varices without bleeding ([place])   . Hypertension associated with diabetes ([place]) 08/16/2019  . Chest pain 11/27/2017  . Vitamin D deficiency 05/15/2017  . Iron deficiency 05/15/2017  . Breast screening declined 05/15/2017  . Colonoscopy refused 05/15/2017  . Choroidal nevus of both eyes 07/18/2016  . Corneal epithelial and basement membrane dystrophy 07/18/2016  . Thrombocytopenia ([place]) 04/14/2016  . Hyperlipidemia  associated with type 2 diabetes mellitus ([place]) 05/25/2015  . Gastroesophageal reflux disease 05/25/2015  . Type 2 diabetes mellitus with other specified complication ([place]) [id number]   PCP:  Merryl Hacker, No Pharmacy:   [facility] [address] [place] [address] Phone: [phone] Fax: [phone]     Social Determinants of Health (SDOH) Interventions    Readmission Risk Interventions No flowsheet data found.

## 2020-09-16 NOTE — Progress Notes (Signed)
Progress Note     Subjective: CC: she is still having some left chest pain/left upper quadrant but her umbilical and lower abdominal pain is improved from yesterday after having a bowel movement with enema. She has very mild nausea, no emesis, and she reports she has eaten all of her breakfast. She denies SHOB   Objective: Vital signs in last 24 hours: Temp:  [98 F (36.7 C)-98.9 F (37.2 C)] 98.9 F (37.2 C) (05/18 0802) Pulse Rate:  [66-101] 80 (05/18 0802) Resp:  [11-26] 18 (05/18 0802) BP: (102-145)/(48-89) 135/62 (05/18 0802) SpO2:  [98 %-100 %] 100 % (05/18 0802) Weight:  [57.7 kg-57.8 kg] 57.8 kg (05/18 0648) Last BM Date: 09/15/20  Intake/Output from previous day: 05/17 0701 - 05/18 0700 In: -  Out: 600 [Urine:600] Intake/Output this shift: No intake/output data recorded.  PE: General: pleasant, drowsy appearing female who is laying in bed in NAD HEENT: head is normocephalic, atraumatic. Mouth is pink and moist Heart: regular, rate, and rhythm. Palpable radial pulses bilaterally Lungs: CTAB. Respiratory effort nonlabored Abd: soft, protuberant, ND, +BS. Periumbilical ventral hernia easily and partially reduces and is nontender. Slight purplish discoloration localized to umbilicus without erythema or skin breakdown MS: all 4 extremities are symmetrical with no cyanosis, clubbing, or edema. Skin: warm and dry with no masses, lesions, or rashes Psych: A&Ox3 with an appropriate affect.    Lab Results:  Recent Labs    09/14/20 1948 09/16/20 0350  WBC 6.7 6.3  HGB 12.4 10.0*  HCT 33.9* 27.8*  PLT 128* PLATELET CLUMPS NOTED ON SMEAR, UNABLE TO ESTIMATE   BMET Recent Labs    09/15/20 0500 09/16/20 0350  NA 133* 133*  K 4.3 4.5  CL 106 107  CO2 22 20*  GLUCOSE 217* 124*  BUN 21* 20  CREATININE 1.48* 1.54*  CALCIUM 9.6 9.7   PT/INR Recent Labs    09/15/20 1236 09/16/20 0350  LABPROT 18.3* 18.2*  INR 1.5* 1.5*   CMP     Component Value  Date/Time   NA 133 (L) 09/16/2020 0350   K 4.5 09/16/2020 0350   CL 107 09/16/2020 0350   CO2 20 (L) 09/16/2020 0350   GLUCOSE 124 (H) 09/16/2020 0350   BUN 20 09/16/2020 0350   CREATININE 1.54 (H) 09/16/2020 0350   CREATININE 0.95 01/27/2020 1513   CALCIUM 9.7 09/16/2020 0350   PROT 5.0 (L) 09/16/2020 0350   ALBUMIN 2.6 (L) 09/16/2020 0350   AST 36 09/16/2020 0350   ALT 25 09/16/2020 0350   ALKPHOS 87 09/16/2020 0350   BILITOT 3.9 (H) 09/16/2020 0350   GFRNONAA 39 (L) 09/16/2020 0350   GFRNONAA 66 01/27/2020 1513   GFRAA 77 01/27/2020 1513   Lipase     Component Value Date/Time   LIPASE 36 09/15/2020 0209       Studies/Results: CT ABDOMEN PELVIS WO CONTRAST  Result Date: 09/15/2020 CLINICAL DATA:  Acute abdominal pain, hernia suspected EXAM: CT ABDOMEN AND PELVIS WITHOUT CONTRAST TECHNIQUE: Multidetector CT imaging of the abdomen and pelvis was performed following the standard protocol without IV contrast. COMPARISON:  CT January 21, 2020 FINDINGS: Lower chest: No acute abnormality. Normal size heart. Mitral annular calcifications. Coronary artery calcifications. No significant pericardial effusion/thickening. Hepatobiliary: Cirrhotic hepatic morphology with tips stent in place. Embolization coils in the right lobe of the liver. Trace perihepatic ascites. Gallbladder is surgically absent. No biliary ductal dilation. Pancreas: Within normal limits Spleen: Borderline splenomegaly measuring 12.4 cm in maximum craniocaudal dimension Adrenals/Urinary Tract:  Adrenal glands are unremarkable. Kidneys are normal, without renal calculi, focal lesion, or hydronephrosis. Bladder is unremarkable. Stomach/Bowel: Stomach is grossly unremarkable for degree of distension. Normal positioning of the duodenum/ligament of [place]. No pathologic dilation of small bowel. Appendix grossly unremarkable. Moderate volume of formed stool throughout the colon. Colonic diverticulosis without findings of acute  diverticulitis. Vascular/Lymphatic: Aortic and branch vessel atherosclerosis without aneurysmal dilation. Prominent periportal and gastrohepatic lymph nodes measuring up to 9 mm without adenopathy by size criteria likely reactive no pathologically enlarged abdominal or pelvic lymph nodes. Reproductive: Uterus and bilateral adnexa are unremarkable. Other: Fat and nonobstructed small bowel containing ventral hernia with a 3.4 cm aperture width. Mild mesenteric stranding. Diffuse subcutaneous edema and overlying skin thickening. Musculoskeletal: No acute osseous abnormality. IMPRESSION: 1. Fat and nonobstructed small bowel containing ventral hernia with a 3.4 cm aperture width. 2. Cirrhotic hepatic morphology status post TIPS with evidence of portal hypertension. 3. Colonic diverticulosis without findings of acute diverticulitis. 4. Moderate volume of formed stool throughout the colon. 5. Aortic atherosclerosis. Aortic Atherosclerosis (ICD10-I70.0). Electronically Signed   By: Dahlia Bailiff MD   On: 09/15/2020 03:44   DG Chest 2 View  Result Date: 09/14/2020 CLINICAL DATA:  One day chest pain EXAM: CHEST - 2 VIEW COMPARISON:  June 25, 2020 FINDINGS: The heart size and mediastinal contours are within normal limits. No focal consolidation. No pleural effusion. No pneumothorax. The visualized skeletal structures are unremarkable. Cholecystectomy clips. Upper abdominal embolization coils. TIPS stent overlies the right upper quadrant. IMPRESSION: No acute cardiopulmonary disease. Electronically Signed   By: Dahlia Bailiff MD   On: 09/14/2020 20:58   NM PULMONARY VENT AND PERF (V/Q Scan)  Result Date: 09/15/2020 CLINICAL DATA:  60 year old female with acute pain. Cirrhosis, TIPS. EXAM: NUCLEAR MEDICINE PERFUSION LUNG SCAN TECHNIQUE: Perfusion images were obtained in multiple projections after intravenous injection of radiopharmaceutical. Ventilation scans intentionally deferred if perfusion scan and chest x-ray  adequate for interpretation during COVID 19 epidemic. RADIOPHARMACEUTICALS:  4.2 mCi Tc-49mMAA IV COMPARISON:  Noncontrast CT Abdomen and Pelvis 0323 hours today. Two-view chest radiographs 09/14/2020. FINDINGS: Homogeneous perfusion radiotracer activity throughout both lungs. No perfusion defect. IMPRESSION: Normal lung perfusion.  No evidence of pulmonary embolus. Electronically Signed   By: HGenevie AnnM.D.   On: 09/15/2020 07:53   ECHOCARDIOGRAM COMPLETE  Result Date: 09/15/2020    ECHOCARDIOGRAM REPORT   Patient Name:   Meagan GASSNERDate of Exam: 09/15/2020 Medical Rec #:  [id number]      Height:       57.8 in Accession #:    [id number]     Weight:       149.6 lb Date of Birth:  [birth year]      BSA:          1.605 m Patient Age:    89years        BP:           142/58 mmHg Patient Gender: F               HR:           89 bpm. Exam Location:  Inpatient Procedure: 2D Echo, Cardiac Doppler and Color Doppler Indications:    Chest pain  History:        Patient has prior history of Echocardiogram examinations, most                 recent 01/15/2020. Risk Factors:Diabetes and Hypertension.  Sonographer:  Cammy Brochure Referring Phys: [place]  1. Left ventricular ejection fraction, by estimation, is 60 to 65%. The left ventricle has normal function. The left ventricle has no regional wall motion abnormalities. There is moderate concentric left ventricular hypertrophy. Left ventricular diastolic parameters are consistent with Grade II diastolic dysfunction (pseudonormalization). Elevated left atrial pressure.  2. Right ventricular systolic function is normal. The right ventricular size is normal.  3. Left atrial size was moderately dilated.  4. The mitral valve is abnormal. No evidence of mitral valve regurgitation. The mean mitral valve gradient is 3.0 mmHg with average heart rate of 75 bpm. Moderate mitral annular calcification.  5. The aortic valve is tricuspid. Aortic valve  regurgitation is not visualized. Mild aortic valve sclerosis is present, with no evidence of aortic valve stenosis. Comparison(s): A prior study was performed on 01/15/2020. Prior images reviewed side by side. Similar to prior; tissues Doppler is diminished from prior. FINDINGS  Left Ventricle: Left ventricular ejection fraction, by estimation, is 60 to 65%. The left ventricle has normal function. The left ventricle has no regional wall motion abnormalities. The left ventricular internal cavity size was normal in size. There is  moderate concentric left ventricular hypertrophy. Left ventricular diastolic parameters are consistent with Grade II diastolic dysfunction (pseudonormalization). Elevated left atrial pressure. Right Ventricle: The right ventricular size is normal. No increase in right ventricular wall thickness. Right ventricular systolic function is normal. Left Atrium: Left atrial size was moderately dilated. Right Atrium: Right atrial size was normal in size. Pericardium: There is no evidence of pericardial effusion. Mitral Valve: The mitral valve is abnormal. Moderate mitral annular calcification. No evidence of mitral valve regurgitation. MV peak gradient, 8.0 mmHg. The mean mitral valve gradient is 3.0 mmHg with average heart rate of 75 bpm. Tricuspid Valve: The tricuspid valve is normal in structure. Tricuspid valve regurgitation is not demonstrated. No evidence of tricuspid stenosis. Aortic Valve: The aortic valve is tricuspid. Aortic valve regurgitation is not visualized. Mild aortic valve sclerosis is present, with no evidence of aortic valve stenosis. Aortic valve mean gradient measures 10.0 mmHg. Aortic valve peak gradient measures 18.7 mmHg. Aortic valve area, by VTI measures 2.79 cm. Pulmonic Valve: The pulmonic valve was normal in structure. Pulmonic valve regurgitation is not visualized. No evidence of pulmonic stenosis. Aorta: The aortic root and ascending aorta are structurally normal, with  no evidence of dilitation. IAS/Shunts: The atrial septum is grossly normal.  LEFT VENTRICLE PLAX 2D LVIDd:         4.50 cm  Diastology LVIDs:         2.90 cm  LV e' medial:    5.33 cm/s LV PW:         1.20 cm  LV E/e' medial:  18.6 LV IVS:        1.20 cm  LV e' lateral:   6.96 cm/s LVOT diam:     2.00 cm  LV E/e' lateral: 14.2 LV SV:         118 LV SV Index:   74 LVOT Area:     3.14 cm  RIGHT VENTRICLE RV Basal diam:  3.70 cm RV S prime:     16.60 cm/s TAPSE (M-mode): 3.1 cm LEFT ATRIUM             Index       RIGHT ATRIUM           Index LA diam:        4.10  cm 2.55 cm/m  RA Area:     12.80 cm LA Vol (A2C):   69.8 ml 43.49 ml/m RA Volume:   27.20 ml  16.95 ml/m LA Vol (A4C):   70.5 ml 43.93 ml/m LA Biplane Vol: 71.0 ml 44.24 ml/m  AORTIC VALVE AV Area (Vmax):    2.78 cm AV Area (Vmean):   2.70 cm AV Area (VTI):     2.79 cm AV Vmax:           216.00 cm/s AV Vmean:          142.000 cm/s AV VTI:            0.424 m AV Peak Grad:      18.7 mmHg AV Mean Grad:      10.0 mmHg LVOT Vmax:         191.00 cm/s LVOT Vmean:        122.000 cm/s LVOT VTI:          0.377 m LVOT/AV VTI ratio: 0.89  AORTA Ao Root diam: 2.80 cm Ao Asc diam:  3.50 cm MITRAL VALVE MV Area (PHT): 2.62 cm     SHUNTS MV Area VTI:   2.53 cm     Systemic VTI:  0.38 m MV Peak grad:  8.0 mmHg     Systemic Diam: 2.00 cm MV Mean grad:  3.0 mmHg MV Vmax:       1.41 m/s MV Vmean:      86.3 cm/s MV Decel Time: 289 msec MV E velocity: 99.10 cm/s MV A velocity: 144.00 cm/s MV E/A ratio:  0.69 Rudean Haskell MD Electronically signed by Rudean Haskell MD Signature Date/Time: 09/15/2020/5:21:05 PM    Final     Anti-infectives: Anti-infectives (From admission, onward)   Start     Dose/Rate Route Frequency Ordered Stop   09/15/20 1230  ciprofloxacin (CIPRO) tablet 500 mg        500 mg Oral Daily with breakfast 09/15/20 1226         Assessment/Plan  Cirrhosis - likely 2/2NASH versus cryptogenic, followed by Dr. Havery Moros S/p TIPS  01/14/20 Portal hypertension Esophageal varices Anemia Thrombocytopenia DM GERD Constipation Chest pain/ SOB  Ventral hernia containing fat and small bowel - Hernia continues to be soft and mostly reducible. She has had a BM and abdominal pain improved. She is a high risk surgical candidate and does not need an emergent surgical intervention at this point. Continue bowel regimen and SBP work up as per GI/medicine  General surgery will sign off but please do not hesitate to reach out to [place] for any needs or concerns.     LOS: 1 day    [facility] 09/16/2020, 10:20 AM Please see Amion for pager number during day hours 7:00am-4:30pm

## 2020-09-16 NOTE — Progress Notes (Signed)
PROGRESS NOTE    KARY SUGRUE  EHO:[id number] DOB: [birth year] DOA: 09/14/2020 PCP: Pcp, No   Chief Complaint  Patient presents with  . Chest Pain    Brief Narrative:  60 year old lady prior history of cirrhosis, esophageal varices s/p TIPS, diabetes mellitus, GERD, hypertension, resents with left-sided chest pain, burning pain in T2 distribution, does not cross the midline.   Assessment & Plan:   Principal Problem:   Chest pain Active Problems:   Gastroesophageal reflux disease   Type 2 diabetes mellitus with other specified complication (HCC)   Esophageal varices without bleeding (HCC)   SBP (spontaneous bacterial peritonitis) (HCC)   Liver cirrhosis secondary to NASH (HCC)    Abdominal pain, left lower quadrant Ultrasound paracentesis ordered for further evaluation. Empirically on Cipro for SBP prophylaxis. GI on board and appreciate recommendations.   Nash cirrhosis Continue with home medications at this time.   AKI Improved with hydration.   Chronic thrombocytopenia Secondary to cirrhosis.   Chronic hyponatremia. Hemoglobin stable around 133.   Left-sided chest pain, burning pain does not cross the midline. Does not appear to be cardiac in nature, tenderness on palpations, no rash seen. Not related to breathing. We will start her empirically on Valtrex 1 g 3 times daily.    Type 2 diabetes mellitus CBG (last 3)  Recent Labs    09/15/20 2143 09/16/20 0800 09/16/20 1218  GLUCAP 172* 140* 223*   Resume SSI.  Uncontrolled with hyperglycemia.  Hemoglobin A1c is 7.2%   Anemia of chronic disease: Hemoglobin around 10.   Ventral Hernia containing fat and small bowel: - hernia is reducible and soft.  - appreciate Surgery recommendations.     DVT prophylaxis:scd's Code Status: (Full code) Family Communication: family at bedside.  Disposition:   Status is: Inpatient  Remains inpatient appropriate because:Ongoing diagnostic testing  needed not appropriate for outpatient work up and Unsafe d/c plan   Dispo: The patient is from: Home              Anticipated d/c is to: Home              Patient currently is not medically stable to d/c.   Difficult to place patient No       Consultants:   Surgery  Gastroenterology.    Procedures: none.    Antimicrobials: Antibiotics Given (last 72 hours)    Date/Time Action Medication Dose   09/15/20 1407 Given   ciprofloxacin (CIPRO) tablet 500 mg 500 mg   09/16/20 0826 Given   ciprofloxacin (CIPRO) tablet 500 mg 500 mg          Subjective: Chest pain on the left, tenderness present.   Objective: Vitals:   09/16/20 0450 09/16/20 0648 09/16/20 0802 09/16/20 1221  BP: (!) 127/56  135/62 124/62  Pulse: 72  80 75  Resp: 18  18 18   Temp: 98.2 F (36.8 C)  98.9 F (37.2 C) 98.5 F (36.9 C)  TempSrc: Oral  Oral Oral  SpO2: 98%  100% 98%  Weight:  57.8 kg    Height:        Intake/Output Summary (Last 24 hours) at 09/16/2020 1534 Last data filed at 09/16/2020 0700 Gross per 24 hour  Intake --  Output 600 ml  Net -600 ml   Filed Weights   09/15/20 1938 09/16/20 0648  Weight: 57.7 kg 57.8 kg    Examination:  General exam: Appears calm and comfortable  Respiratory system: Clear to auscultation. Respiratory effort normal.  Cardiovascular system: S1 & S2 heard, RRR. No JVD, murmurs, rubs, gallops or clicks. No pedal edema. Gastrointestinal system: Abdomen is soft, mildly tender and mildly distended bowel sounds wnl.  Central nervous system: Alert and oriented. No focal neurological deficits. Extremities: Symmetric 5 x 5 power. Skin: No rashes, lesions or ulcers Psychiatry:Mood & affect appropriate.     Data Reviewed: I have personally reviewed following labs and imaging studies  CBC: Recent Labs  Lab 09/14/20 1948 09/16/20 0350  WBC 6.7 6.3  HGB 12.4 10.0*  HCT 33.9* 27.8*  MCV 89.0 90.0  PLT 128* PLATELET CLUMPS NOTED ON SMEAR, UNABLE TO  ESTIMATE    Basic Metabolic Panel: Recent Labs  Lab 09/14/20 1948 09/15/20 0500 09/16/20 0350  NA 130* 133* 133*  K 4.8 4.3 4.5  CL 99 106 107  CO2 21* 22 20*  GLUCOSE 361* 217* 124*  BUN 23* 21* 20  CREATININE 1.75* 1.48* 1.54*  CALCIUM 10.4* 9.6 9.7    GFR: Estimated Creatinine Clearance: 30.4 mL/min (A) (by C-G formula based on SCr of 1.54 mg/dL (H)).  Liver Function Tests: Recent Labs  Lab 09/15/20 0209 09/16/20 0350  AST 48* 36  ALT 30 25  ALKPHOS 104 87  BILITOT 4.5* 3.9*  PROT 5.7* 5.0*  ALBUMIN 2.9* 2.6*    CBG: Recent Labs  Lab 09/15/20 1711 09/15/20 2143 09/16/20 0800 09/16/20 1218  GLUCAP 202* 172* 140* 223*     Recent Results (from the past 240 hour(s))  SARS CORONAVIRUS 2 (TAT 6-24 HRS) Nasopharyngeal Nasopharyngeal Swab     Status: None   Collection Time: 09/15/20  2:10 PM   Specimen: Nasopharyngeal Swab  Result Value Ref Range Status   SARS Coronavirus 2 NEGATIVE NEGATIVE Final    Comment: (NOTE) SARS-CoV-2 target nucleic acids are NOT DETECTED.  The SARS-CoV-2 RNA is generally detectable in upper and lower respiratory specimens during the acute phase of infection. Negative results do not preclude SARS-CoV-2 infection, do not rule out co-infections with other pathogens, and should not be used as the sole basis for treatment or other patient management decisions. Negative results must be combined with clinical observations, patient history, and epidemiological information. The expected result is Negative.  Fact Sheet for Patients: SugarRoll.be  Fact Sheet for Healthcare Providers: https://www.woods-mathews.com/  This test is not yet approved or cleared by the [place] FDA and  has been authorized for detection and/or diagnosis of SARS-CoV-2 by FDA under an Emergency Use Authorization (EUA). This EUA will remain  in effect (meaning this test can be used) for the duration of the COVID-19  declaration under Se ction 564(b)(1) of the Act, 21 U.S.C. section 360bbb-3(b)(1), unless the authorization is terminated or revoked sooner.  Performed at [facility], [place] [address], [place], [place] [address]          Radiology Studies: CT ABDOMEN PELVIS WO CONTRAST  Result Date: 09/15/2020 CLINICAL DATA:  Acute abdominal pain, hernia suspected EXAM: CT ABDOMEN AND PELVIS WITHOUT CONTRAST TECHNIQUE: Multidetector CT imaging of the abdomen and pelvis was performed following the standard protocol without IV contrast. COMPARISON:  CT January 21, 2020 FINDINGS: Lower chest: No acute abnormality. Normal size heart. Mitral annular calcifications. Coronary artery calcifications. No significant pericardial effusion/thickening. Hepatobiliary: Cirrhotic hepatic morphology with tips stent in place. Embolization coils in the right lobe of the liver. Trace perihepatic ascites. Gallbladder is surgically absent. No biliary ductal dilation. Pancreas: Within normal limits Spleen: Borderline splenomegaly measuring 12.4 cm in maximum craniocaudal dimension Adrenals/Urinary  Tract: Adrenal glands are unremarkable. Kidneys are normal, without renal calculi, focal lesion, or hydronephrosis. Bladder is unremarkable. Stomach/Bowel: Stomach is grossly unremarkable for degree of distension. Normal positioning of the duodenum/ligament of [place]itz. No pathologic dilation of small bowel. Appendix grossly unremarkable. Moderate volume of formed stool throughout the colon. Colonic diverticulosis without findings of acute diverticulitis. Vascular/Lymphatic: Aortic and branch vessel atherosclerosis without aneurysmal dilation. Prominent periportal and gastrohepatic lymph nodes measuring up to 9 mm without adenopathy by size criteria likely reactive no pathologically enlarged abdominal or pelvic lymph nodes. Reproductive: Uterus and bilateral adnexa are unremarkable. Other: Fat and nonobstructed small bowel containing ventral  hernia with a 3.4 cm aperture width. Mild mesenteric stranding. Diffuse subcutaneous edema and overlying skin thickening. Musculoskeletal: No acute osseous abnormality. IMPRESSION: 1. Fat and nonobstructed small bowel containing ventral hernia with a 3.4 cm aperture width. 2. Cirrhotic hepatic morphology status post TIPS with evidence of portal hypertension. 3. Colonic diverticulosis without findings of acute diverticulitis. 4. Moderate volume of formed stool throughout the colon. 5. Aortic atherosclerosis. Aortic Atherosclerosis (ICD10-I70.0). Electronically Signed   By: Dahlia Bailiff MD   On: 09/15/2020 03:44   DG Chest 2 View  Result Date: 09/14/2020 CLINICAL DATA:  One day chest pain EXAM: CHEST - 2 VIEW COMPARISON:  June 25, 2020 FINDINGS: The heart size and mediastinal contours are within normal limits. No focal consolidation. No pleural effusion. No pneumothorax. The visualized skeletal structures are unremarkable. Cholecystectomy clips. Upper abdominal embolization coils. TIPS stent overlies the right upper quadrant. IMPRESSION: No acute cardiopulmonary disease. Electronically Signed   By: Dahlia Bailiff MD   On: 09/14/2020 20:58   NM PULMONARY VENT AND PERF (V/Q Scan)  Result Date: 09/15/2020 CLINICAL DATA:  60 year old female with acute pain. Cirrhosis, TIPS. EXAM: NUCLEAR MEDICINE PERFUSION LUNG SCAN TECHNIQUE: Perfusion images were obtained in multiple projections after intravenous injection of radiopharmaceutical. Ventilation scans intentionally deferred if perfusion scan and chest x-ray adequate for interpretation during COVID 19 epidemic. RADIOPHARMACEUTICALS:  4.2 mCi Tc-1mMAA IV COMPARISON:  Noncontrast CT Abdomen and Pelvis 0323 hours today. Two-view chest radiographs 09/14/2020. FINDINGS: Homogeneous perfusion radiotracer activity throughout both lungs. No perfusion defect. IMPRESSION: Normal lung perfusion.  No evidence of pulmonary embolus. Electronically Signed   By: HGenevie AnnM.D.    On: 09/15/2020 07:53   ECHOCARDIOGRAM COMPLETE  Result Date: 09/15/2020    ECHOCARDIOGRAM REPORT   Patient Name:   DRABAB CURRINGTONDate of Exam: 09/15/2020 Medical Rec #:  [id number]      Height:       57.8 in Accession #:    [id number]     Weight:       149.6 lb Date of Birth:  [birth year]      BSA:          1.605 m Patient Age:    89years        BP:           142/58 mmHg Patient Gender: F               HR:           89 bpm. Exam Location:  Inpatient Procedure: 2D Echo, Cardiac Doppler and Color Doppler Indications:    Chest pain  History:        Patient has prior history of Echocardiogram examinations, most                 recent 01/15/2020. Risk Factors:Diabetes and Hypertension.  Sonographer:    Cammy Brochure Referring Phys: [phone] [place]  1. Left ventricular ejection fraction, by estimation, is 60 to 65%. The left ventricle has normal function. The left ventricle has no regional wall motion abnormalities. There is moderate concentric left ventricular hypertrophy. Left ventricular diastolic parameters are consistent with Grade II diastolic dysfunction (pseudonormalization). Elevated left atrial pressure.  2. Right ventricular systolic function is normal. The right ventricular size is normal.  3. Left atrial size was moderately dilated.  4. The mitral valve is abnormal. No evidence of mitral valve regurgitation. The mean mitral valve gradient is 3.0 mmHg with average heart rate of 75 bpm. Moderate mitral annular calcification.  5. The aortic valve is tricuspid. Aortic valve regurgitation is not visualized. Mild aortic valve sclerosis is present, with no evidence of aortic valve stenosis. Comparison(s): A prior study was performed on 01/15/2020. Prior images reviewed side by side. Similar to prior; tissues Doppler is diminished from prior. FINDINGS  Left Ventricle: Left ventricular ejection fraction, by estimation, is 60 to 65%. The left ventricle has normal function. The left ventricle  has no regional wall motion abnormalities. The left ventricular internal cavity size was normal in size. There is  moderate concentric left ventricular hypertrophy. Left ventricular diastolic parameters are consistent with Grade II diastolic dysfunction (pseudonormalization). Elevated left atrial pressure. Right Ventricle: The right ventricular size is normal. No increase in right ventricular wall thickness. Right ventricular systolic function is normal. Left Atrium: Left atrial size was moderately dilated. Right Atrium: Right atrial size was normal in size. Pericardium: There is no evidence of pericardial effusion. Mitral Valve: The mitral valve is abnormal. Moderate mitral annular calcification. No evidence of mitral valve regurgitation. MV peak gradient, 8.0 mmHg. The mean mitral valve gradient is 3.0 mmHg with average heart rate of 75 bpm. Tricuspid Valve: The tricuspid valve is normal in structure. Tricuspid valve regurgitation is not demonstrated. No evidence of tricuspid stenosis. Aortic Valve: The aortic valve is tricuspid. Aortic valve regurgitation is not visualized. Mild aortic valve sclerosis is present, with no evidence of aortic valve stenosis. Aortic valve mean gradient measures 10.0 mmHg. Aortic valve peak gradient measures 18.7 mmHg. Aortic valve area, by VTI measures 2.79 cm. Pulmonic Valve: The pulmonic valve was normal in structure. Pulmonic valve regurgitation is not visualized. No evidence of pulmonic stenosis. Aorta: The aortic root and ascending aorta are structurally normal, with no evidence of dilitation. IAS/Shunts: The atrial septum is grossly normal.  LEFT VENTRICLE PLAX 2D LVIDd:         4.50 cm  Diastology LVIDs:         2.90 cm  LV e' medial:    5.33 cm/s LV PW:         1.20 cm  LV E/e' medial:  18.6 LV IVS:        1.20 cm  LV e' lateral:   6.96 cm/s LVOT diam:     2.00 cm  LV E/e' lateral: 14.2 LV SV:         118 LV SV Index:   74 LVOT Area:     3.14 cm  RIGHT VENTRICLE RV Basal  diam:  3.70 cm RV S prime:     16.60 cm/s TAPSE (M-mode): 3.1 cm LEFT ATRIUM             Index       RIGHT ATRIUM           Index LA diam:  4.10 cm 2.55 cm/m  RA Area:     12.80 cm LA Vol (A2C):   69.8 ml 43.49 ml/m RA Volume:   27.20 ml  16.95 ml/m LA Vol (A4C):   70.5 ml 43.93 ml/m LA Biplane Vol: 71.0 ml 44.24 ml/m  AORTIC VALVE AV Area (Vmax):    2.78 cm AV Area (Vmean):   2.70 cm AV Area (VTI):     2.79 cm AV Vmax:           216.00 cm/s AV Vmean:          142.000 cm/s AV VTI:            0.424 m AV Peak Grad:      18.7 mmHg AV Mean Grad:      10.0 mmHg LVOT Vmax:         191.00 cm/s LVOT Vmean:        122.000 cm/s LVOT VTI:          0.377 m LVOT/AV VTI ratio: 0.89  AORTA Ao Root diam: 2.80 cm Ao Asc diam:  3.50 cm MITRAL VALVE MV Area (PHT): 2.62 cm     SHUNTS MV Area VTI:   2.53 cm     Systemic VTI:  0.38 m MV Peak grad:  8.0 mmHg     Systemic Diam: 2.00 cm MV Mean grad:  3.0 mmHg MV Vmax:       1.41 m/s MV Vmean:      86.3 cm/s MV Decel Time: 289 msec MV E velocity: 99.10 cm/s MV A velocity: 144.00 cm/s MV E/A ratio:  0.69 Rudean Haskell MD Electronically signed by Rudean Haskell MD Signature Date/Time: 09/15/2020/5:21:05 PM    Final         Scheduled Meds: . aMILoride  10 mg Oral Daily  . ciprofloxacin  500 mg Oral Q breakfast  . insulin aspart  0-15 Units Subcutaneous TID WC  . lactulose  10 g Oral BID  . valACYclovir  1,000 mg Oral TID   Continuous Infusions: . albumin human       LOS: 1 day       Hosie Poisson, MD [facility]   To contact the attending provider between 7A-7P or the covering provider during after hours 7P-7A, please log into the web site [email or web address] and access using universal [place] password for that web site. If you do not have the password, please call the hospital operator.  09/16/2020, 3:34 PM

## 2020-09-17 ENCOUNTER — Inpatient Hospital Stay (HOSPITAL_COMMUNITY): Payer: Self-pay

## 2020-09-17 LAB — BASIC METABOLIC PANEL
Anion gap: 6 (ref 5–15)
BUN: 17 mg/dL (ref 6–20)
CO2: 20 mmol/L — ABNORMAL LOW (ref 22–32)
Calcium: 9.3 mg/dL (ref 8.9–10.3)
Chloride: 105 mmol/L (ref 98–111)
Creatinine, Ser: 1.57 mg/dL — ABNORMAL HIGH (ref 0.44–1.00)
GFR, Estimated: 38 mL/min — ABNORMAL LOW (ref >60)
Glucose, Bld: 215 mg/dL — ABNORMAL HIGH (ref 70–99)
Potassium: 4.2 mmol/L (ref 3.5–5.1)
Sodium: 131 mmol/L — ABNORMAL LOW (ref 135–145)

## 2020-09-17 LAB — GLUCOSE, CAPILLARY
Glucose-Capillary: 176 mg/dL — ABNORMAL HIGH (ref 70–99)
Glucose-Capillary: 251 mg/dL — ABNORMAL HIGH (ref 70–99)
Glucose-Capillary: 144 mg/dL — ABNORMAL HIGH (ref 70–99)
Glucose-Capillary: 259 mg/dL — ABNORMAL HIGH (ref 70–99)

## 2020-09-17 MED ORDER — ALBUMIN HUMAN 25 % IV SOLN
12.5000 g | Freq: Four times a day (QID) | INTRAVENOUS | Status: AC
  Administered 2020-09-17 (×2): 12.5 g via INTRAVENOUS
  Filled 2020-09-17 (×2): qty 50

## 2020-09-17 MED ORDER — TRAMADOL HCL 50 MG PO TABS
50.0000 mg | ORAL_TABLET | Freq: Two times a day (BID) | ORAL | Status: DC | PRN
  Administered 2020-09-17 – 2020-09-18 (×3): 50 mg via ORAL
  Filled 2020-09-17 (×3): qty 1

## 2020-09-17 NOTE — Care Management (Signed)
1421 09-17-20 Case Manager spoke with patient and she is without insurance at this time. Patient states her husband has disability. Patient uses a cane for assistance. Patient is agreeable to have Case Manager schedule an appointment at the [facility]. Patient will soon be moving to [facility]. Patient may benefit from [facility] if she discharges during the weekday. Graves-Bigelow, Ocie Cornfield, RN, BSN Case Manager

## 2020-09-17 NOTE — Progress Notes (Signed)
Daily Rounding Note  09/17/2020, 9:36 AM  LOS: 2 days   SUBJECTIVE:   Chief complaint:    Low left sided abdominal and  Right now just has a bloated feeling in her lower abdomen, no pain.  No stools since the enema on Tuesday night but is passing a lot of gas. Pain in the left shoulder area feels like a burning.  No visible rash yet.  OBJECTIVE:         Vital signs in last 24 hours:    Temp:  [97.5 F (36.4 C)-98.5 F (36.9 C)] 97.5 F (36.4 C) (05/19 0745) Pulse Rate:  [70-84] 72 (05/19 0745) Resp:  [16-18] 18 (05/19 0745) BP: (115-137)/(49-62) 137/52 (05/19 0745) SpO2:  [98 %-100 %] 98 % (05/19 0745) Weight:  [57.8 kg] 57.8 kg (05/19 0432) Last BM Date: 09/15/20 Filed Weights   09/15/20 1938 09/16/20 0648 09/17/20 0432  Weight: 57.7 kg 57.8 kg 57.8 kg   General: Looks chronically and acutely ill.  Alert. Looks tired. Heart: RRR. Chest: No labored breathing or cough.  Lungs clear bilaterally Abdomen: Soft.  Not tender.  Active bowel sounds. Extremities: No CCE. MS: Tenderness to very light touch over the anterior left shoulder region.  No visible rash. Neuro/Psych: Pleasant, calm, no asterixis or tremors.  Appropriate.  Fully oriented.    Intake/Output from previous day: 05/18 0701 - 05/19 0700 In: -  Out: 1000 [Urine:1000]  Intake/Output this shift: Total I/O In: 240 [P.O.:240] Out: -   Lab Results: Recent Labs    09/14/20 1948 09/16/20 0350  WBC 6.7 6.3  HGB 12.4 10.0*  HCT 33.9* 27.8*  PLT 128* PLATELET CLUMPS NOTED ON SMEAR, UNABLE TO ESTIMATE   BMET Recent Labs    09/14/20 1948 09/15/20 0500 09/16/20 0350  NA 130* 133* 133*  K 4.8 4.3 4.5  CL 99 106 107  CO2 21* 22 20*  GLUCOSE 361* 217* 124*  BUN 23* 21* 20  CREATININE 1.75* 1.48* 1.54*  CALCIUM 10.4* 9.6 9.7   LFT Recent Labs    09/15/20 0209 09/16/20 0350  PROT 5.7* 5.0*  ALBUMIN 2.9* 2.6*  AST 48* 36  ALT 30 25   ALKPHOS 104 87  BILITOT 4.5* 3.9*  BILIDIR 1.5*  --   IBILI 3.0*  --    PT/INR Recent Labs    09/15/20 1236 09/16/20 0350  LABPROT 18.3* 18.2*  INR 1.5* 1.5*   Hepatitis Panel No results for input(s): HEPBSAG, HCVAB, HEPAIGM, HEPBIGM in the last 72 hours.  Studies/Results: ECHOCARDIOGRAM COMPLETE  Result Date: 09/15/2020 IMPRESSIONS  1. Left ventricular ejection fraction, by estimation, is 60 to 65%. The left ventricle has normal function. The left ventricle has no regional wall motion abnormalities. There is moderate concentric left ventricular hypertrophy. Left ventricular diastolic parameters are consistent with Grade II diastolic dysfunction (pseudonormalization). Elevated left atrial pressure.  2. Right ventricular systolic function is normal. The right ventricular size is normal.  3. Left atrial size was moderately dilated.  4. The mitral valve is abnormal. No evidence of mitral valve regurgitation. The mean mitral valve gradient is 3.0 mmHg with average heart rate of 75 bpm. Moderate mitral annular calcification.  5. The aortic valve is tricuspid. Aortic valve regurgitation is not visualized. Mild aortic valve sclerosis is present, with no evidence of aortic valve stenosis. Comparison(s): A prior study was performed on 01/15/2020. Prior images reviewed side by side. Similar to prior; tissues Doppler is diminished from  prior.   Electronically signed by Rudean Haskell MD Signature Date/Time: 09/15/2020/5:21:05 PM    Final    Scheduled Meds: . aMILoride  10 mg Oral Daily  . ciprofloxacin  500 mg Oral Q breakfast  . insulin aspart  0-15 Units Subcutaneous TID WC  . lactulose  10 g Oral BID  . valACYclovir  1,000 mg Oral TID   Continuous Infusions: . albumin human     PRN Meds:.morphine injection, ondansetron **OR** ondansetron (ZOFRAN) IV  ASSESMENT:   *Lower abdominal and left upper quadrant radiating to axillary pain. Nausea. Nonobstructing ventral/umbilical  hernia. Surgery not seeing indication for intervention which would be high risk under any circumstances. Ran out of SBP prophylactic Cipro about a week ago, only trace perihepatic ascites seen on CT scan. ? Yet to emerge zoster?  Pain improved after bowel movements following enema.  *NASH cirrhosis.    * DIPS 01/2020 for refractory ascites. History SBP. Ran out of Cipro a week ago, restarted at admission. Current CT showing trace perihepatic ascites.  Lasix and Amiloride PTA  *AKI.    * Hx IDA. Onbidiron.Hgb 12.4 >> 10 following hydration, 10.9 in late 06/2020.  *Thrombocytopenia. Chronic. Current plateletsbetter than previous.  *   Coagulopathy.  INR 1.5.  *Hyponatremia. Na 133 yesterday.  .    *Previous banding of esophageal varices. Latest EGD 01/2020 with small remnant esophageal varices. APC treatment of nonbleeding gastric AVMs.    *   Ventral, umbilical hernia.     PLAN   *   IR paracentesis ordered.  Unclear if there will be sufficient ascites for tap if so, will send fluid for cell count/differential. IV Albumin ordered after paracentesis but modifying order for doses now and in 6 h, regardless of paracentesis or not, due to AKI.    A repeat BMET is ordered for this AM.    *   cmet in AM.    *   ? Hold Amiloride? Received it this AM.  Will d/c for now and d/w Dr Tarri Glenn.        Azucena Freed  09/17/2020, 9:36 AM Phone [phone]

## 2020-09-17 NOTE — Progress Notes (Signed)
PROGRESS NOTE    SREEJA SPIES  PXT:[id number] DOB: 1961-02-19 DOA: 09/14/2020 PCP: Pcp, No   Chief Complaint  Patient presents with  . Chest Pain    Brief Narrative:  60 year old lady prior history of cirrhosis, esophageal varices s/p TIPS, diabetes mellitus, GERD, hypertension, resents with left-sided chest pain, burning pain in T2 distribution, does not cross the midline. She reports her pain is constant, and feels like a rash is developing.    Assessment & Plan:   Principal Problem:   Chest pain Active Problems:   Gastroesophageal reflux disease   Type 2 diabetes mellitus with other specified complication (HCC)   Esophageal varices without bleeding (HCC)   SBP (spontaneous bacterial peritonitis) (HCC)   Liver cirrhosis secondary to NASH (HCC)    Abdominal pain, left lower quadrant Ultrasound paracentesis ordered, and fluid to be sent for evaluation.  Empirically on Cipro for SBP prophylaxis. GI on board and appreciate recommendations.   Nash cirrhosis Continue with home medications at this time. Started the patient back on lactulose 10 mg twice daily   AKI Baseline creatinine less than 1, admitted with a creatinine of 1.75, improved to 1.57 and stable. Improved with hydration. Diuretics on hold for AKI   Chronic thrombocytopenia Secondary to cirrhosis.   Chronic hyponatremia. sodium stable around 133.   Left-sided chest pain, burning pain does not cross the midline. Does not appear to be cardiac in nature, tenderness on palpation, no rash seen.  chest pain not related to breathing. We will start her empirically on Valtrex 1 g 3 times daily for possible zoster. Pain control with IV morphine and oral tramadol. Echo showed left ventricular ejection fraction of 60 to 65%, no regional wall abnormalities, moderate concentric left ventricular hypertrophy.  With grade 2 diastolic dysfunction and elevated left atrial pressures. Minimally elevated troponin, EKG  shows normal sinus rhythm, no ischemic changes.   Type 2 diabetes mellitus CBG (last 3)  Recent Labs    09/16/20 2134 09/17/20 0748 09/17/20 1202  GLUCAP 240* 176* 251*   Resume SSI.  Uncontrolled with hyperglycemia.  Hemoglobin A1c is 7.2%, add novolog 2 units tidac.    Anemia of chronic disease: Hemoglobin around 10.   Ventral Hernia containing fat and small bowel: - hernia is reducible and soft.  - appreciate Surgery recommendations.     DVT prophylaxis:scd's Code Status: (Full code) Family Communication: None at bedside Disposition:   Status is: Inpatient  Remains inpatient appropriate because:Ongoing diagnostic testing needed not appropriate for outpatient work up and Unsafe d/c plan   Dispo: The patient is from: Home              Anticipated d/c is to: Home              Patient currently is not medically stable to d/c.   Difficult to place patient No       Consultants:   Surgery  Gastroenterology.    Procedures: none.    Antimicrobials: Antibiotics Given (last 72 hours)    Date/Time Action Medication Dose   09/15/20 1407 Given   ciprofloxacin (CIPRO) tablet 500 mg 500 mg   09/16/20 0826 Given   ciprofloxacin (CIPRO) tablet 500 mg 500 mg   09/16/20 1630 Given   valACYclovir (VALTREX) tablet 1,000 mg 1,000 mg   09/16/20 2132 Given   valACYclovir (VALTREX) tablet 1,000 mg 1,000 mg   09/17/20 0834 Given   ciprofloxacin (CIPRO) tablet 500 mg 500 mg   09/17/20 0834 Given  valACYclovir (VALTREX) tablet 1,000 mg 1,000 mg         Subjective: Burning chest pain present on palpation  Objective: Vitals:   09/16/20 2020 09/16/20 2307 09/17/20 0432 09/17/20 0745  BP: (!) 131/49 (!) 115/55 (!) 125/53 (!) 137/52  Pulse: 72 84 74 72  Resp: 16 16 18 18   Temp: 98.1 F (36.7 C) 98.5 F (36.9 C) 98.5 F (36.9 C) (!) 97.5 F (36.4 C)  TempSrc: Oral Oral Oral Oral  SpO2: 100% 98% 98% 98%  Weight:   57.8 kg   Height:        Intake/Output  Summary (Last 24 hours) at 09/17/2020 1208 Last data filed at 09/17/2020 0815 Gross per 24 hour  Intake 240 ml  Output 1000 ml  Net -760 ml   Filed Weights   09/15/20 1938 09/16/20 0648 09/17/20 0432  Weight: 57.7 kg 57.8 kg 57.8 kg    Examination:  General exam: Well-developed lady not in any kind of distress. Respiratory system: Air entry fair bilateral no wheezing or rhonchi Cardiovascular system: S1-S2 heard, regular rate rhythm, no JVD no pedal edema Gastrointestinal system: Abdomen is soft, mildly tender and mildly distended bowel sounds wnl.  Central nervous system: Alert and oriented, grossly nonfocal Extremities: No pedal edema r. Skin: No rashes seen Psychiatry: Mood is appropriate    Data Reviewed: I have personally reviewed following labs and imaging studies  CBC: Recent Labs  Lab 09/14/20 1948 09/16/20 0350  WBC 6.7 6.3  HGB 12.4 10.0*  HCT 33.9* 27.8*  MCV 89.0 90.0  PLT 128* PLATELET CLUMPS NOTED ON SMEAR, UNABLE TO ESTIMATE    Basic Metabolic Panel: Recent Labs  Lab 09/14/20 1948 09/15/20 0500 09/16/20 0350 09/17/20 0928  NA 130* 133* 133* 131*  K 4.8 4.3 4.5 4.2  CL 99 106 107 105  CO2 21* 22 20* 20*  GLUCOSE 361* 217* 124* 215*  BUN 23* 21* 20 17  CREATININE 1.75* 1.48* 1.54* 1.57*  CALCIUM 10.4* 9.6 9.7 9.3    GFR: Estimated Creatinine Clearance: 29.8 mL/min (A) (by C-G formula based on SCr of 1.57 mg/dL (H)).  Liver Function Tests: Recent Labs  Lab 09/15/20 0209 09/16/20 0350  AST 48* 36  ALT 30 25  ALKPHOS 104 87  BILITOT 4.5* 3.9*  PROT 5.7* 5.0*  ALBUMIN 2.9* 2.6*    CBG: Recent Labs  Lab 09/16/20 1218 09/16/20 1553 09/16/20 2134 09/17/20 0748 09/17/20 1202  GLUCAP 223* 173* 240* 176* 251*     Recent Results (from the past 240 hour(s))  SARS CORONAVIRUS 2 (TAT 6-24 HRS) Nasopharyngeal Nasopharyngeal Swab     Status: None   Collection Time: 09/15/20  2:10 PM   Specimen: Nasopharyngeal Swab  Result Value Ref  Range Status   SARS Coronavirus 2 NEGATIVE NEGATIVE Final    Comment: (NOTE) SARS-CoV-2 target nucleic acids are NOT DETECTED.  The SARS-CoV-2 RNA is generally detectable in upper and lower respiratory specimens during the acute phase of infection. Negative results do not preclude SARS-CoV-2 infection, do not rule out co-infections with other pathogens, and should not be used as the sole basis for treatment or other patient management decisions. Negative results must be combined with clinical observations, patient history, and epidemiological information. The expected result is Negative.  Fact Sheet for Patients: SugarRoll.be  Fact Sheet for Healthcare Providers: https://www.woods-mathews.com/  This test is not yet approved or cleared by the [place] FDA and  has been authorized for detection and/or diagnosis of SARS-CoV-2 by FDA  under an Emergency Use Authorization (EUA). This EUA will remain  in effect (meaning this test can be used) for the duration of the COVID-19 declaration under Se ction 564(b)(1) of the Act, 21 U.S.C. section 360bbb-3(b)(1), unless the authorization is terminated or revoked sooner.  Performed at [facility], [place] [address], [place], [place] [address]          Radiology Studies: ECHOCARDIOGRAM COMPLETE  Result Date: 09/15/2020    ECHOCARDIOGRAM REPORT   Patient Name:   MYCALA WARSHAWSKY Date of Exam: 09/15/2020 Medical Rec #:  [id number]       Height:       57.8 in Accession #:    [id number]      Weight:       149.6 lb Date of Birth:  1961/02/26       BSA:          1.605 m Patient Age:    43 years        BP:           142/58 mmHg Patient Gender: F               HR:           89 bpm. Exam Location:  Inpatient Procedure: 2D Echo, Cardiac Doppler and Color Doppler Indications:    Chest pain  History:        Patient has prior history of Echocardiogram examinations, most                 recent 01/15/2020. Risk  Factors:Diabetes and Hypertension.  Sonographer:    Cammy Brochure Referring Phys: [phone] [place]  1. Left ventricular ejection fraction, by estimation, is 60 to 65%. The left ventricle has normal function. The left ventricle has no regional wall motion abnormalities. There is moderate concentric left ventricular hypertrophy. Left ventricular diastolic parameters are consistent with Grade II diastolic dysfunction (pseudonormalization). Elevated left atrial pressure.  2. Right ventricular systolic function is normal. The right ventricular size is normal.  3. Left atrial size was moderately dilated.  4. The mitral valve is abnormal. No evidence of mitral valve regurgitation. The mean mitral valve gradient is 3.0 mmHg with average heart rate of 75 bpm. Moderate mitral annular calcification.  5. The aortic valve is tricuspid. Aortic valve regurgitation is not visualized. Mild aortic valve sclerosis is present, with no evidence of aortic valve stenosis. Comparison(s): A prior study was performed on 01/15/2020. Prior images reviewed side by side. Similar to prior; tissues Doppler is diminished from prior. FINDINGS  Left Ventricle: Left ventricular ejection fraction, by estimation, is 60 to 65%. The left ventricle has normal function. The left ventricle has no regional wall motion abnormalities. The left ventricular internal cavity size was normal in size. There is  moderate concentric left ventricular hypertrophy. Left ventricular diastolic parameters are consistent with Grade II diastolic dysfunction (pseudonormalization). Elevated left atrial pressure. Right Ventricle: The right ventricular size is normal. No increase in right ventricular wall thickness. Right ventricular systolic function is normal. Left Atrium: Left atrial size was moderately dilated. Right Atrium: Right atrial size was normal in size. Pericardium: There is no evidence of pericardial effusion. Mitral Valve: The mitral valve is  abnormal. Moderate mitral annular calcification. No evidence of mitral valve regurgitation. MV peak gradient, 8.0 mmHg. The mean mitral valve gradient is 3.0 mmHg with average heart rate of 75 bpm. Tricuspid Valve: The tricuspid valve is normal in structure. Tricuspid valve regurgitation is  not demonstrated. No evidence of tricuspid stenosis. Aortic Valve: The aortic valve is tricuspid. Aortic valve regurgitation is not visualized. Mild aortic valve sclerosis is present, with no evidence of aortic valve stenosis. Aortic valve mean gradient measures 10.0 mmHg. Aortic valve peak gradient measures 18.7 mmHg. Aortic valve area, by VTI measures 2.79 cm. Pulmonic Valve: The pulmonic valve was normal in structure. Pulmonic valve regurgitation is not visualized. No evidence of pulmonic stenosis. Aorta: The aortic root and ascending aorta are structurally normal, with no evidence of dilitation. IAS/Shunts: The atrial septum is grossly normal.  LEFT VENTRICLE PLAX 2D LVIDd:         4.50 cm  Diastology LVIDs:         2.90 cm  LV e' medial:    5.33 cm/s LV PW:         1.20 cm  LV E/e' medial:  18.6 LV IVS:        1.20 cm  LV e' lateral:   6.96 cm/s LVOT diam:     2.00 cm  LV E/e' lateral: 14.2 LV SV:         118 LV SV Index:   74 LVOT Area:     3.14 cm  RIGHT VENTRICLE RV Basal diam:  3.70 cm RV S prime:     16.60 cm/s TAPSE (M-mode): 3.1 cm LEFT ATRIUM             Index       RIGHT ATRIUM           Index LA diam:        4.10 cm 2.55 cm/m  RA Area:     12.80 cm LA Vol (A2C):   69.8 ml 43.49 ml/m RA Volume:   27.20 ml  16.95 ml/m LA Vol (A4C):   70.5 ml 43.93 ml/m LA Biplane Vol: 71.0 ml 44.24 ml/m  AORTIC VALVE AV Area (Vmax):    2.78 cm AV Area (Vmean):   2.70 cm AV Area (VTI):     2.79 cm AV Vmax:           216.00 cm/s AV Vmean:          142.000 cm/s AV VTI:            0.424 m AV Peak Grad:      18.7 mmHg AV Mean Grad:      10.0 mmHg LVOT Vmax:         191.00 cm/s LVOT Vmean:        122.000 cm/s LVOT VTI:           0.377 m LVOT/AV VTI ratio: 0.89  AORTA Ao Root diam: 2.80 cm Ao Asc diam:  3.50 cm MITRAL VALVE MV Area (PHT): 2.62 cm     SHUNTS MV Area VTI:   2.53 cm     Systemic VTI:  0.38 m MV Peak grad:  8.0 mmHg     Systemic Diam: 2.00 cm MV Mean grad:  3.0 mmHg MV Vmax:       1.41 m/s MV Vmean:      86.3 cm/s MV Decel Time: 289 msec MV E velocity: 99.10 cm/s MV A velocity: 144.00 cm/s MV E/A ratio:  0.69 Rudean Haskell MD Electronically signed by Rudean Haskell MD Signature Date/Time: 09/15/2020/5:21:05 PM    Final         Scheduled Meds: . ciprofloxacin  500 mg Oral Q breakfast  . insulin aspart  0-15 Units Subcutaneous TID WC  . lactulose  10 g Oral BID  . valACYclovir  1,000 mg Oral TID   Continuous Infusions: . albumin human 12.5 g (09/17/20 1103)     LOS: 2 days       Hosie Poisson, MD [facility]   To contact the attending provider between 7A-7P or the covering provider during after hours 7P-7A, please log into the web site [email or web address] and access using universal [place] password for that web site. If you do not have the password, please call the hospital operator.  09/17/2020, 12:08 PM

## 2020-09-18 ENCOUNTER — Telehealth: Payer: Self-pay

## 2020-09-18 ENCOUNTER — Inpatient Hospital Stay (HOSPITAL_COMMUNITY): Payer: Self-pay

## 2020-09-18 LAB — CBC WITH DIFFERENTIAL/PLATELET
Abs Immature Granulocytes: 0.01 10*3/uL (ref 0.00–0.07)
Basophils Absolute: 0.1 10*3/uL (ref 0.0–0.1)
Basophils Relative: 2 %
Eosinophils Absolute: 0.4 10*3/uL (ref 0.0–0.5)
Eosinophils Relative: 8 %
HCT: 25.2 % — ABNORMAL LOW (ref 36.0–46.0)
Hemoglobin: 9.2 g/dL — ABNORMAL LOW (ref 12.0–15.0)
Immature Granulocytes: 0 %
Lymphocytes Relative: 28 %
Lymphs Abs: 1.5 10*3/uL (ref 0.7–4.0)
MCH: 32.7 pg (ref 26.0–34.0)
MCHC: 36.5 g/dL — ABNORMAL HIGH (ref 30.0–36.0)
MCV: 89.7 fL (ref 80.0–100.0)
Monocytes Absolute: 0.6 10*3/uL (ref 0.1–1.0)
Monocytes Relative: 11 %
Neutro Abs: 2.7 10*3/uL (ref 1.7–7.7)
Neutrophils Relative %: 51 %
Platelets: 100 10*3/uL — ABNORMAL LOW (ref 150–400)
RBC: 2.81 MIL/uL — ABNORMAL LOW (ref 3.87–5.11)
RDW: 14.5 % (ref 11.5–15.5)
WBC: 5.2 10*3/uL (ref 4.0–10.5)
nRBC: 0 % (ref 0.0–0.2)

## 2020-09-18 LAB — COMPREHENSIVE METABOLIC PANEL
ALT: 21 U/L (ref 0–44)
AST: 38 U/L (ref 15–41)
Albumin: 2.8 g/dL — ABNORMAL LOW (ref 3.5–5.0)
Alkaline Phosphatase: 81 U/L (ref 38–126)
Anion gap: 5 (ref 5–15)
BUN: 14 mg/dL (ref 6–20)
CO2: 20 mmol/L — ABNORMAL LOW (ref 22–32)
Calcium: 9.5 mg/dL (ref 8.9–10.3)
Chloride: 107 mmol/L (ref 98–111)
Creatinine, Ser: 1.47 mg/dL — ABNORMAL HIGH (ref 0.44–1.00)
GFR, Estimated: 41 mL/min — ABNORMAL LOW (ref >60)
Glucose, Bld: 155 mg/dL — ABNORMAL HIGH (ref 70–99)
Potassium: 4.4 mmol/L (ref 3.5–5.1)
Sodium: 132 mmol/L — ABNORMAL LOW (ref 135–145)
Total Bilirubin: 3.1 mg/dL — ABNORMAL HIGH (ref 0.3–1.2)
Total Protein: 4.8 g/dL — ABNORMAL LOW (ref 6.5–8.1)

## 2020-09-18 LAB — GLUCOSE, CAPILLARY
Glucose-Capillary: 194 mg/dL — ABNORMAL HIGH (ref 70–99)
Glucose-Capillary: 240 mg/dL — ABNORMAL HIGH (ref 70–99)
Glucose-Capillary: 220 mg/dL — ABNORMAL HIGH (ref 70–99)
Glucose-Capillary: 248 mg/dL — ABNORMAL HIGH (ref 70–99)

## 2020-09-18 MED ORDER — LACTULOSE 10 GM/15ML PO SOLN
20.0000 g | Freq: Two times a day (BID) | ORAL | Status: DC
  Administered 2020-09-18 – 2020-09-19 (×2): 20 g via ORAL
  Filled 2020-09-18 (×2): qty 30

## 2020-09-18 NOTE — Telephone Encounter (Signed)
-----   Message from Yetta Flock, MD sent at 09/18/2020  1:35 PM EDT ----- Regarding: RE: fup appt for pt Thanks Lyanne Co can you help coordinate? Hopefully she can afford her medications and can come to the appointment.    ----- Message ----- From: Vena Rua, PA-C Sent: 09/18/2020   1:11 PM EDT To: Greggory Keen, LPN, # Subject: fup appt for pt                                Hello. This patient was admitted with abdominal and shoulder pain.  Some renal insufficiency.  Not much ascites on imaging, insufficient quantity to perform paracentesis.  Abdominal pain better after bowel movements.  Diuretics discontinued. 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  Had run out of Cipro (use for SBP prophylaxis), and was not able to afford appointment to get this medication refilled. She will discharge on prescription for Cipro.  However she needs a follow-up appointment.  Can you arrange an appointment in 6 weeks or so?  Perhaps by then she will have some sort of insurance coverage.  Thank you.  I tried calling the office to make an appointment but there was no answer.  Of a great weekend.  Thank you, S

## 2020-09-18 NOTE — Telephone Encounter (Signed)
Lm on vm for patient to return call 

## 2020-09-18 NOTE — Progress Notes (Signed)
Daily Rounding Note  09/18/2020, 9:33 AM  LOS: 3 days   SUBJECTIVE:   Chief complaint:  L shoulder area pain    Burning pain in anterior left shoulder region continues.  Receiving tramadol and as needed morphine (2 mg IV yesterday, none today).  No signs of rash. Has not had a bowel movement since Monday night after receiving enema. Lower abdominal discomfort remains improved.  OBJECTIVE:         Vital signs in last 24 hours:    Temp:  [97.8 F (36.6 C)-98.1 F (36.7 C)] 98.1 F (36.7 C) (05/20 0836) Pulse Rate:  [77-95] 77 (05/20 0448) Resp:  [16-20] 16 (05/20 0836) BP: (121-139)/(54-62) 121/54 (05/20 0836) SpO2:  [99 %-100 %] 100 % (05/19 1938) Weight:  [58.2 kg] 58.2 kg (05/20 0448) Last BM Date: 09/15/20 Filed Weights   09/16/20 0648 09/17/20 0432 09/18/20 0448  Weight: 57.8 kg 57.8 kg 58.2 kg   General: Looks somewhat chronically ill but alert, comfortable. Heart: RRR. Chest: Clear bilaterally.  No labored breathing or cough Abdomen: Soft, nontender, nondistended. Extremities: No CCE. Neuro/Psych: Appropriate.  Fully alert and oriented.  No tremors, no asterixis. Derm: No rash in the anterior left shoulder area.  Intake/Output from previous day: 05/19 0701 - 05/20 0700 In: 840 [P.O.:840] Out: 950 [Urine:950]  Intake/Output this shift: No intake/output data recorded.  Lab Results: Recent Labs    09/16/20 0350 09/18/20 0323  WBC 6.3 5.2  HGB 10.0* 9.2*  HCT 27.8* 25.2*  PLT PLATELET CLUMPS NOTED ON SMEAR, UNABLE TO ESTIMATE 100*   BMET Recent Labs    09/16/20 0350 09/17/20 0928 09/18/20 0323  NA 133* 131* 132*  K 4.5 4.2 4.4  CL 107 105 107  CO2 20* 20* 20*  GLUCOSE 124* 215* 155*  BUN 20 17 14   CREATININE 1.54* 1.57* 1.47*  CALCIUM 9.7 9.3 9.5   LFT Recent Labs    09/16/20 0350 09/18/20 0323  PROT 5.0* 4.8*  ALBUMIN 2.6* 2.8*  AST 36 38  ALT 25 21  ALKPHOS 87 81  BILITOT  3.9* 3.1*   PT/INR Recent Labs    09/15/20 1236 09/16/20 0350  LABPROT 18.3* 18.2*  INR 1.5* 1.5*   Hepatitis Panel No results for input(s): HEPBSAG, HCVAB, HEPAIGM, HEPBIGM in the last 72 hours.  Studies/Results: US LIVER DOPPLER  Result Date: 09/18/2020 CLINICAL DATA:  Evaluate TIPS stent. Stent was placed on 01/14/2020. EXAM: DUPLEX ULTRASOUND OF LIVER AND TIPS SHUNT TECHNIQUE: Color and duplex Doppler ultrasound was performed to evaluate the hepatic in-flow and out-flow vessels. COMPARISON:  None. FINDINGS: Portal Vein Velocities Main:  29 cm/sec Right:  17 cm/sec Left:  16 cm/sec TIPS Stent Velocities Portal: 88 cm/sec Mid:  77 Hepatic: 74 cm/sec IVC: Patent Hepatic Vein Velocities Right:  24 cm/sec Mid:  29 cm/sec Left:  36 cm/sec Splenic Vein: 28 cm/sec Superior Mesenteric Vein: 33 cm/sec Hepatic Artery: 115.8 cm/sec Ascities: Present Varices: Absent Other findings: Predominantly hepatopetal flow in the portal veins. TIPS stent is patent without focal velocity elevation. Normal hepatofugal flow in the hepatic veins. Small amount of perihepatic ascites. IMPRESSION: 1. TIPS stent is patent.  No evidence for focal stenosis. 2. Small amount of perihepatic ascites. Electronically Signed   By: Markus Daft M.D.   On: 09/18/2020 07:44    Scheduled Meds: . ciprofloxacin  500 mg Oral Q breakfast  . insulin aspart  0-15 Units Subcutaneous TID WC  . lactulose  10 g Oral BID  . valACYclovir  1,000 mg Oral TID   Continuous Infusions: PRN Meds:.morphine injection, ondansetron **OR** ondansetron (ZOFRAN) IV, traMADol   ASSESMENT:   *Lower abdominal and left upper quadrant radiating to axillary pain. Nausea. Nonobstructing ventral/umbilical hernia. Lower ab pain resolved after BM's.  Burning L shoulder burning, neuropathic type axillary pain (? Yet to emerge zoster?) persists  Lower abd pain improved after bowel movements following enema.  *NASH cirrhosis.  * DIPS 01/2020 for  refractory ascites. Hx SBP. Ran out of prophylactic Cipro a week ago and unable to afford cost of visit to MD required to obtain refill, restarted at admission. Current CT showing trace perihepatic ascites. Doppler studies yesterday confirmed patent TIPS, small perihepatic ascites, insufficient to perform paracentesis.  PA-C confirmed today:  no fluid collection for safe paracentesis. Lasix and Amiloride PTA, on hold now.    *AKI.  improved.   Diuretics on hold.    * Hx IDA. Onbidiron.Hgb 12.4>> 10 following hydration, 10.9 in late 06/2020.  *Thrombocytopenia. Chronic. Current plateletsbetter than previous.  *Coagulopathy. INR 1.5.  *Hyponatremia.Na 132, relatively stable.  *Previous banding of esophageal varices. Latest EGD 01/2020 with small remnant esophageal varices,APC treatment of nonbleeding gastric AVMs.   *   Umbilical hernia.  Surgery not seeing indication for intervention which would be high risk under any circumstances.  *   Lack of medical insurance.  Lost insurance when she lost her job.      PLAN   *  Do not restart Amiloride or Lasix.    *   ? Short term trial of Gabapentin for shoulder pain?    *   Continue Cipro 500 mg daily, provide at least 3 month prescription for this at discharge.    *   Upped Lactulose 20 gm bid.      Azucena Freed  09/18/2020, 9:33 AM Phone [phone]

## 2020-09-18 NOTE — Progress Notes (Signed)
PROGRESS NOTE    Meagan Peck  DTO:[id number] DOB: 1960-12-02 DOA: 09/14/2020 PCP: Pcp, No   Chief Complaint  Patient presents with  . Chest Pain    Brief Narrative:  60 year old lady prior history of cirrhosis, esophageal varices s/p TIPS, diabetes mellitus, GERD, hypertension, resents with left-sided chest pain, burning pain in T2 distribution, does not cross the midline. She reports her pain is constant, and feels like a rash is developing.  Pt seen and examined at bedside, no rash is evident. But she reports burning pain on palpation on the left side  US of the abdomen done , unable to perform paracentesis by IR.    Assessment & Plan:   Principal Problem:   Chest pain Active Problems:   Gastroesophageal reflux disease   Type 2 diabetes mellitus with other specified complication (HCC)   Esophageal varices without bleeding (HCC)   SBP (spontaneous bacterial peritonitis) (HCC)   Liver cirrhosis secondary to NASH (HCC)    Abdominal pain, left lower quadrant Ultrasound paracentesis ordered, US done, but Trace abdominopelvic ascites by ultrasound, paracentesis couldn't be done.  Empirically on Cipro for SBP prophylaxis. GI on board and appreciate recommendations.   Nash cirrhosis Continue with home medications at this time. Started the patient back on lactulose 10 mg twice daily.   AKI Baseline creatinine less than 1, admitted with a creatinine of 1.75, improved to 1.57 to 1.4 and stable. Diuretics on hold for AKI   Chronic thrombocytopenia Secondary to cirrhosis.   Chronic hyponatremia. sodium stable around 132.   Left-sided chest pain, burning pain does not cross the midline. Does not appear to be cardiac in nature, tenderness on palpation, no rash seen.  chest pain not related to breathing. We will start her empirically on Valtrex 1 g 3 times daily for possible zoster. Pain control with IV morphine and oral tramadol. Echo showed left ventricular ejection  fraction of 60 to 65%, no regional wall abnormalities, moderate concentric left ventricular hypertrophy.  With grade 2 diastolic dysfunction and elevated left atrial pressures. Minimally elevated troponin, EKG shows normal sinus rhythm, no ischemic changes. Unable to give to NSAID's due to rash/ allergy.     Type 2 diabetes mellitus CBG (last 3)  Recent Labs    09/18/20 0811 09/18/20 1128 09/18/20 1604  GLUCAP 194* 240* 220*   Resume SSI.  Uncontrolled with hyperglycemia.  Hemoglobin A1c is 7.2%, increase 4 units tidac.    Anemia of chronic disease: Hemoglobin around 10.   Ventral Hernia containing fat and small bowel: - hernia is reducible and soft.  - appreciate Surgery recommendations.     DVT prophylaxis:scd's Code Status: (Full code) Family Communication: None at bedside Disposition:   Status is: Inpatient  Remains inpatient appropriate because:Ongoing diagnostic testing needed not appropriate for outpatient work up and Unsafe d/c plan   Dispo: The patient is from: Home              Anticipated d/c is to: Home              Patient currently is not medically stable to d/c.   Difficult to place patient No       Consultants:   Surgery  Gastroenterology.    Procedures: none.    Antimicrobials: Antibiotics Given (last 72 hours)    Date/Time Action Medication Dose   09/16/20 0826 Given   ciprofloxacin (CIPRO) tablet 500 mg 500 mg   09/16/20 1630 Given   valACYclovir (VALTREX) tablet 1,000 mg 1,000 mg  09/16/20 2132 Given   valACYclovir (VALTREX) tablet 1,000 mg 1,000 mg   09/17/20 0865 Given   ciprofloxacin (CIPRO) tablet 500 mg 500 mg   09/17/20 0834 Given   valACYclovir (VALTREX) tablet 1,000 mg 1,000 mg   09/17/20 1625 Given   valACYclovir (VALTREX) tablet 1,000 mg 1,000 mg   09/17/20 2129 Given   valACYclovir (VALTREX) tablet 1,000 mg 1,000 mg   09/18/20 7846 Given   ciprofloxacin (CIPRO) tablet 500 mg 500 mg   09/18/20 9629 Given    valACYclovir (VALTREX) tablet 1,000 mg 1,000 mg   09/18/20 1610 Given   valACYclovir (VALTREX) tablet 1,000 mg 1,000 mg         Subjective: Slightly improved burning pain, not resolved.   Objective: Vitals:   09/18/20 0448 09/18/20 0836 09/18/20 1012 09/18/20 1326  BP: 123/60 (!) 121/54 122/61 (!) 117/56  Pulse: 77  78 80  Resp: 17 16 16 16   Temp: 97.9 F (36.6 C) 98.1 F (36.7 C) 98.2 F (36.8 C) 98.2 F (36.8 C)  TempSrc: Oral Oral Oral Oral  SpO2:  100% 100% 97%  Weight: 58.2 kg     Height:        Intake/Output Summary (Last 24 hours) at 09/18/2020 1833 Last data filed at 09/18/2020 0700 Gross per 24 hour  Intake 240 ml  Output 450 ml  Net -210 ml   Filed Weights   09/16/20 0648 09/17/20 0432 09/18/20 0448  Weight: 57.8 kg 57.8 kg 58.2 kg    Examination:  General exam: alert, mild distress from burning pain.  Respiratory system: clear to auscultation, no wheezing or rhonchi.  Cardiovascular system: S1-S2 heard, RRR, no JVD, no pedal edema.  Gastrointestinal system: Abdomen is soft, non tender, bowel sounds wnl. .  Central nervous system: alert and oriented, non focal Extremities: no pedal edema.  Skin: no rashes seen.  Psychiatry: Mood iis appropriate.     Data Reviewed: I have personally reviewed following labs and imaging studies  CBC: Recent Labs  Lab 09/14/20 1948 09/16/20 0350 09/18/20 0323  WBC 6.7 6.3 5.2  NEUTROABS  --   --  2.7  HGB 12.4 10.0* 9.2*  HCT 33.9* 27.8* 25.2*  MCV 89.0 90.0 89.7  PLT 128* PLATELET CLUMPS NOTED ON SMEAR, UNABLE TO ESTIMATE 100*    Basic Metabolic Panel: Recent Labs  Lab 09/14/20 1948 09/15/20 0500 09/16/20 0350 09/17/20 0928 09/18/20 0323  NA 130* 133* 133* 131* 132*  K 4.8 4.3 4.5 4.2 4.4  CL 99 106 107 105 107  CO2 21* 22 20* 20* 20*  GLUCOSE 361* 217* 124* 215* 155*  BUN 23* 21* 20 17 14   CREATININE 1.75* 1.48* 1.54* 1.57* 1.47*  CALCIUM 10.4* 9.6 9.7 9.3 9.5    GFR: Estimated Creatinine  Clearance: 31.9 mL/min (A) (by C-G formula based on SCr of 1.47 mg/dL (H)).  Liver Function Tests: Recent Labs  Lab 09/15/20 0209 09/16/20 0350 09/18/20 0323  AST 48* 36 38  ALT 30 25 21   ALKPHOS 104 87 81  BILITOT 4.5* 3.9* 3.1*  PROT 5.7* 5.0* 4.8*  ALBUMIN 2.9* 2.6* 2.8*    CBG: Recent Labs  Lab 09/17/20 1619 09/17/20 2117 09/18/20 0811 09/18/20 1128 09/18/20 1604  GLUCAP 144* 259* 194* 240* 220*     Recent Results (from the past 240 hour(s))  SARS CORONAVIRUS 2 (TAT 6-24 HRS) Nasopharyngeal Nasopharyngeal Swab     Status: None   Collection Time: 09/15/20  2:10 PM   Specimen: Nasopharyngeal Swab  Result Value Ref Range Status   SARS Coronavirus 2 NEGATIVE NEGATIVE Final    Comment: (NOTE) SARS-CoV-2 target nucleic acids are NOT DETECTED.  The SARS-CoV-2 RNA is generally detectable in upper and lower respiratory specimens during the acute phase of infection. Negative results do not preclude SARS-CoV-2 infection, do not rule out co-infections with other pathogens, and should not be used as the sole basis for treatment or other patient management decisions. Negative results must be combined with clinical observations, patient history, and epidemiological information. The expected result is Negative.  Fact Sheet for Patients: SugarRoll.be  Fact Sheet for Healthcare Providers: https://www.woods-mathews.com/  This test is not yet approved or cleared by the [place] FDA and  has been authorized for detection and/or diagnosis of SARS-CoV-2 by FDA under an Emergency Use Authorization (EUA). This EUA will remain  in effect (meaning this test can be used) for the duration of the COVID-19 declaration under Se ction 564(b)(1) of the Act, 21 U.S.C. section 360bbb-3(b)(1), unless the authorization is terminated or revoked sooner.  Performed at [facility], [place] [address], [place], [place] [address]           Radiology Studies: IR ABDOMEN US LIMITED  Result Date: 09/18/2020 CLINICAL DATA:  Abdominal distension, assess for paracentesis EXAM: LIMITED ABDOMEN ULTRASOUND FOR ASCITES TECHNIQUE: Limited ultrasound survey for ascites was performed in all four abdominal quadrants. COMPARISON:  09/17/2020 FINDINGS: Survey of the abdominal 4 quadrants demonstrates trace amount ascites about the inferior liver. No significant large volume of ascites that warrants paracentesis. Procedure not performed. IMPRESSION: Trace abdominopelvic ascites by ultrasound Electronically Signed   By: Jerilynn Mages.  Shick M.D.   On: 09/18/2020 10:03   US LIVER DOPPLER  Result Date: 09/18/2020 CLINICAL DATA:  Evaluate TIPS stent. Stent was placed on 01/14/2020. EXAM: DUPLEX ULTRASOUND OF LIVER AND TIPS SHUNT TECHNIQUE: Color and duplex Doppler ultrasound was performed to evaluate the hepatic in-flow and out-flow vessels. COMPARISON:  None. FINDINGS: Portal Vein Velocities Main:  29 cm/sec Right:  17 cm/sec Left:  16 cm/sec TIPS Stent Velocities Portal: 88 cm/sec Mid:  77 Hepatic: 74 cm/sec IVC: Patent Hepatic Vein Velocities Right:  24 cm/sec Mid:  29 cm/sec Left:  36 cm/sec Splenic Vein: 28 cm/sec Superior Mesenteric Vein: 33 cm/sec Hepatic Artery: 115.8 cm/sec Ascities: Present Varices: Absent Other findings: Predominantly hepatopetal flow in the portal veins. TIPS stent is patent without focal velocity elevation. Normal hepatofugal flow in the hepatic veins. Small amount of perihepatic ascites. IMPRESSION: 1. TIPS stent is patent.  No evidence for focal stenosis. 2. Small amount of perihepatic ascites. Electronically Signed   By: Markus Daft M.D.   On: 09/18/2020 07:44        Scheduled Meds: . ciprofloxacin  500 mg Oral Q breakfast  . insulin aspart  0-15 Units Subcutaneous TID WC  . lactulose  20 g Oral BID  . valACYclovir  1,000 mg Oral TID   Continuous Infusions:    LOS: 3 days       Hosie Poisson, MD [facility]   To contact the attending provider between 7A-7P or the covering provider during after hours 7P-7A, please log into the web site [email or web address] and access using universal [place] password for that web site. If you do not have the password, please call the hospital operator.  09/18/2020, 6:33 PM

## 2020-09-18 NOTE — Progress Notes (Signed)
Patient presented to IR for paracentesis today.   Abdomen were visualized with ultrasound.  No fluid collection for safe approach found.  Ultrasound images were saved for documentation purpose.   Paracentesis was NOT performed.    Armando Gang Trinka Keshishyan PA-C 09/18/2020 9:59 AM

## 2020-09-19 DIAGNOSIS — K219 Gastro-esophageal reflux disease without esophagitis: Secondary | ICD-10-CM

## 2020-09-19 LAB — GLUCOSE, CAPILLARY
Glucose-Capillary: 166 mg/dL — ABNORMAL HIGH (ref 70–99)
Glucose-Capillary: 236 mg/dL — ABNORMAL HIGH (ref 70–99)
Glucose-Capillary: 284 mg/dL — ABNORMAL HIGH (ref 70–99)

## 2020-09-19 MED ORDER — CIPROFLOXACIN HCL 500 MG PO TABS
500.0000 mg | ORAL_TABLET | Freq: Every day | ORAL | 0 refills | Status: DC
  Filled 2020-09-19: qty 5, 5d supply, fill #0

## 2020-09-19 MED ORDER — VALACYCLOVIR HCL 1 G PO TABS
1000.0000 mg | ORAL_TABLET | Freq: Three times a day (TID) | ORAL | 0 refills | Status: AC
  Filled 2020-09-21: qty 21, 7d supply, fill #0

## 2020-09-19 MED ORDER — TRAMADOL HCL 50 MG PO TABS
50.0000 mg | ORAL_TABLET | Freq: Two times a day (BID) | ORAL | 0 refills | Status: AC | PRN
  Filled 2020-09-19: qty 6, 3d supply, fill #0

## 2020-09-19 MED ORDER — CIPROFLOXACIN HCL 500 MG PO TABS
500.0000 mg | ORAL_TABLET | Freq: Every day | ORAL | 2 refills | Status: AC
  Filled 2020-09-21: qty 30, 30d supply, fill #0
  Filled 2020-10-23: qty 30, 30d supply, fill #1
  Filled 2020-11-26: qty 30, 30d supply, fill #2

## 2020-09-19 MED ORDER — LACTULOSE ENCEPHALOPATHY 10 GM/15ML PO SOLN
20.0000 g | Freq: Two times a day (BID) | ORAL | 0 refills | Status: DC
  Filled 2020-09-19: qty 236, 4d supply, fill #0

## 2020-09-19 NOTE — Discharge Summary (Signed)
Physician Discharge Summary  Meagan Peck NAT:[id number] DOB: 1960-07-17 DOA: 09/14/2020  PCP: Merryl Hacker, No  Admit date: 09/14/2020 Discharge date: 09/19/2020  Admitted From: Home.  Disposition:  Home.   Recommendations for Outpatient Follow-up:  1. Follow up with PCP in 1-2 weeks 2. Please obtain BMP/CBC in one week Please follow up with gastroenterology as recommended.  We have stopped the lasix as you were in AKI, please resume diuretics as per GI.   Discharge Condition: guarded.  CODE STATUS: full code.  Diet recommendation: Heart Healthy / Carb Modified   Brief/Interim Summary:  60 year old lady prior history of cirrhosis, esophageal varices s/p TIPS, diabetes mellitus, GERD, hypertension, resents with left-sided chest pain, burning pain in T2 distribution, does not cross the midline. She reports her pain is constant, and feels like a rash is developing.  Pt seen and examined at bedside, no rash is evident. But she reports burning pain on palpation on the left side  US of the abdomen done , unable to perform paracentesis by IR.    Discharge Diagnoses:  Principal Problem:   Chest pain Active Problems:   Gastroesophageal reflux disease   Type 2 diabetes mellitus with other specified complication (HCC)   Esophageal varices without bleeding (HCC)   SBP (spontaneous bacterial peritonitis) (HCC)   Liver cirrhosis secondary to NASH (HCC)     Abdominal pain, left lower quadrant Ultrasound paracentesis ordered, US done, but Trace abdominopelvic ascites by ultrasound, paracentesis couldn't be done.  Empirically on Cipro for SBP prophylaxis, complete 10 day course.  GI on board and appreciate recommendations.   Nash cirrhosis Continue with home medications at this time. Started the patient back on lactulose 10 mg twice daily. US duplex of the liver shows patent TIPS.    AKI Baseline creatinine less than 1, admitted with a creatinine of 1.75, improved to 1.57 to 1.4 and  stable. Diuretics on hold for AKI   Chronic thrombocytopenia Secondary to cirrhosis. No bleeding evident.    Chronic hyponatremia. sodium stable around 132.   Left-sided chest pain, burning pain does not cross the midline. Does not appear to be cardiac in nature, tenderness on palpation, no rash seen.  chest pain not related to breathing. We will start her empirically on Valtrex 1 g 3 times daily for possible zoster, complete the course of 10 days.  Pain control with tramadol.  Echo showed left ventricular ejection fraction of 60 to 65%, no regional wall abnormalities, moderate concentric left ventricular hypertrophy.  With grade 2 diastolic dysfunction and elevated left atrial pressures. Minimally elevated troponin, EKG shows normal sinus rhythm, no ischemic changes. Unable to give to NSAID's due to rash/ allergy.     Type 2 diabetes mellitus Resume home meds on discharge.  Uncontrolled with hyperglycemia.  Hemoglobin A1c is 7.2%,.   Anemia of chronic disease: Hemoglobin around 10.   Ventral Hernia containing fat and small bowel: - hernia is reducible and soft.  - appreciate Surgery recommendations.      Discharge Instructions  Discharge Instructions    Diet - low sodium heart healthy   Complete by: As directed    Discharge instructions   Complete by: As directed    Please follow up with Gastroenterology in one week.  We have stopped your lasix for acute kidney injury, please follow up with PCP, regarding resuming your diuretics.     Allergies as of 09/19/2020      Reactions   Penicillins Anaphylaxis   Happened as a young child  Atrovent [ipratropium] Palpitations   As per patient   Naproxen Rash   Quinine Derivatives Rash   Sulfa Antibiotics Rash      Medication List    STOP taking these medications   aMILoride 5 MG tablet Commonly known as: MIDAMOR   furosemide 20 MG tablet Commonly known as: LASIX   Pfizer-BioNTech COVID-19 Vacc  30 MCG/0.3ML injection Generic drug: COVID-19 mRNA vaccine (Pfizer)   Potassium Chloride ER 20 MEQ Tbcr     TAKE these medications   Accu-Chek FastClix Lancets Misc Check fsbs TID   acetaminophen 500 MG tablet Commonly known as: TYLENOL Take 500-1,000 mg by mouth every 6 (six) hours as needed for moderate pain or headache.   AMBULATORY NON FORMULARY MEDICATION Wedge pillow   cetirizine 10 MG tablet Commonly known as: ZYRTEC Take 1 tablet (10 mg total) by mouth daily. What changed:   when to take this  reasons to take this   ciprofloxacin 500 MG tablet Commonly known as: CIPRO Take 1 tablet (500 mg total) by mouth daily with breakfast. Start taking on: Sep 20, 2020 What changed:   how much to take  when to take this  Another medication with the same name was removed. Continue taking this medication, and follow the directions you see here.   ferrous sulfate 325 (65 FE) MG EC tablet Take 1 tablet (325 mg total) by mouth 2 (two) times daily with a meal.   HumaLOG KwikPen 200 UNIT/ML KwikPen Generic drug: insulin lispro INJECT 10 - 15 UNITS INTO THE SKIN 3 TIMES DAILY BEFORE MEALS What changed:   how much to take  how to take this  when to take this   Krill Oil 500 MG Caps Take 1,000 mg by mouth 2 (two) times daily.   lactulose 10 GM/15ML solution Commonly known as: CHRONULAC Take 30 mLs (20 g total) by mouth 2 (two) times daily. What changed:   how much to take  when to take this  additional instructions   magnesium oxide 400 (240 Mg) MG tablet Commonly known as: MAG-OX TAKE 1 TABLET BY MOUTH TWICE DAILY   MULTI-DAY PO Take 1 tablet by mouth daily.   pantoprazole 40 MG tablet Commonly known as: PROTONIX TAKE 1 TABLET (40 MG TOTAL) BY MOUTH DAILY. What changed: how much to take   traMADol 50 MG tablet Commonly known as: ULTRAM Take 1 tablet (50 mg total) by mouth every 12 (twelve) hours as needed for up to 3 days for moderate pain.    Tyler Aas FlexTouch 200 UNIT/ML FlexTouch Pen Generic drug: insulin degludec INJECT 10 - 50 UNITS INTO THE SKIN DAILY. [employer] TO FASTING GLUCOSE 150. What changed:   how much to take  how to take this  when to take this   UltiCare Micro Pen Needles 32G X 4 MM Misc Generic drug: Insulin Pen Needle USE TO INJECT TRESIBA   valACYclovir 1000 MG tablet Commonly known as: VALTREX Take 1 tablet (1,000 mg total) by mouth 3 (three) times daily for 7 days.   Vitamin D3 125 MCG (5000 UT) Tabs Take 5,000 Units by mouth daily.       Follow-up Information    [name], DO. Schedule an appointment as soon as possible for a visit in 2 days.   Specialty: Osteopathic Medicine Contact information: [address] [place] [address] [phone]        [facility]. Schedule an appointment as soon as possible for a visit  in 2 days.   Contact information: [address] [place] [phone]       [facility] [place] Follow up.   Why: Call to schedule hospital follow up Contact information: [place], [place] [phone]             Allergies  Allergen Reactions  . Penicillins Anaphylaxis    Happened as a young child  . Atrovent [Ipratropium] Palpitations    As per patient  . Naproxen Rash  . Quinine Derivatives Rash  . Sulfa Antibiotics Rash    Consultations:  Gastroenterology.    Procedures/Studies: CT ABDOMEN PELVIS WO CONTRAST  Result Date: 09/15/2020 CLINICAL DATA:  Acute abdominal pain, hernia suspected EXAM: CT ABDOMEN AND PELVIS WITHOUT CONTRAST TECHNIQUE: Multidetector CT imaging of the abdomen and pelvis was performed following the standard protocol without IV contrast. COMPARISON:  CT January 21, 2020 FINDINGS: Lower chest: No acute abnormality. Normal size heart. Mitral annular calcifications. Coronary artery  calcifications. No significant pericardial effusion/thickening. Hepatobiliary: Cirrhotic hepatic morphology with tips stent in place. Embolization coils in the right lobe of the liver. Trace perihepatic ascites. Gallbladder is surgically absent. No biliary ductal dilation. Pancreas: Within normal limits Spleen: Borderline splenomegaly measuring 12.4 cm in maximum craniocaudal dimension Adrenals/Urinary Tract: Adrenal glands are unremarkable. Kidneys are normal, without renal calculi, focal lesion, or hydronephrosis. Bladder is unremarkable. Stomach/Bowel: Stomach is grossly unremarkable for degree of distension. Normal positioning of the duodenum/ligament of [place]. No pathologic dilation of small bowel. Appendix grossly unremarkable. Moderate volume of formed stool throughout the colon. Colonic diverticulosis without findings of acute diverticulitis. Vascular/Lymphatic: Aortic and branch vessel atherosclerosis without aneurysmal dilation. Prominent periportal and gastrohepatic lymph nodes measuring up to 9 mm without adenopathy by size criteria likely reactive no pathologically enlarged abdominal or pelvic lymph nodes. Reproductive: Uterus and bilateral adnexa are unremarkable. Other: Fat and nonobstructed small bowel containing ventral hernia with a 3.4 cm aperture width. Mild mesenteric stranding. Diffuse subcutaneous edema and overlying skin thickening. Musculoskeletal: No acute osseous abnormality. IMPRESSION: 1. Fat and nonobstructed small bowel containing ventral hernia with a 3.4 cm aperture width. 2. Cirrhotic hepatic morphology status post TIPS with evidence of portal hypertension. 3. Colonic diverticulosis without findings of acute diverticulitis. 4. Moderate volume of formed stool throughout the colon. 5. Aortic atherosclerosis. Aortic Atherosclerosis (ICD10-I70.0). Electronically Signed   By: Dahlia Bailiff MD   On: 09/15/2020 03:44   DG Chest 2 View  Result Date: 09/14/2020 CLINICAL DATA:  One day  chest pain EXAM: CHEST - 2 VIEW COMPARISON:  June 25, 2020 FINDINGS: The heart size and mediastinal contours are within normal limits. No focal consolidation. No pleural effusion. No pneumothorax. The visualized skeletal structures are unremarkable. Cholecystectomy clips. Upper abdominal embolization coils. TIPS stent overlies the right upper quadrant. IMPRESSION: No acute cardiopulmonary disease. Electronically Signed   By: Dahlia Bailiff MD   On: 09/14/2020 20:58   NM PULMONARY VENT AND PERF (V/Q Scan)  Result Date: 09/15/2020 CLINICAL DATA:  60 year old female with acute pain. Cirrhosis, TIPS. EXAM: NUCLEAR MEDICINE PERFUSION LUNG SCAN TECHNIQUE: Perfusion images were obtained in multiple projections after intravenous injection of radiopharmaceutical. Ventilation scans intentionally deferred if perfusion scan and chest x-ray adequate for interpretation during COVID 19 epidemic. RADIOPHARMACEUTICALS:  4.2 mCi Tc-32mMAA IV COMPARISON:  Noncontrast CT Abdomen and Pelvis 0323 hours today. Two-view chest radiographs 09/14/2020. FINDINGS: Homogeneous perfusion radiotracer activity throughout both lungs. No perfusion defect. IMPRESSION:  Normal lung perfusion.  No evidence of pulmonary embolus. Electronically Signed   By: Genevie Ann M.D.   On: 09/15/2020 07:53   ECHOCARDIOGRAM COMPLETE  Result Date: 09/15/2020    ECHOCARDIOGRAM REPORT   Patient Name:   Meagan Peck Date of Exam: 09/15/2020 Medical Rec #:  [id number]       Height:       57.8 in Accession #:    [id number]      Weight:       149.6 lb Date of Birth:  1960/06/16       BSA:          1.605 m Patient Age:    89 years        BP:           142/58 mmHg Patient Gender: F               HR:           89 bpm. Exam Location:  Inpatient Procedure: 2D Echo, Cardiac Doppler and Color Doppler Indications:    Chest pain  History:        Patient has prior history of Echocardiogram examinations, most                 recent 01/15/2020. Risk Factors:Diabetes and  Hypertension.  Sonographer:    Cammy Brochure Referring Phys: [phone] [place]  1. Left ventricular ejection fraction, by estimation, is 60 to 65%. The left ventricle has normal function. The left ventricle has no regional wall motion abnormalities. There is moderate concentric left ventricular hypertrophy. Left ventricular diastolic parameters are consistent with Grade II diastolic dysfunction (pseudonormalization). Elevated left atrial pressure.  2. Right ventricular systolic function is normal. The right ventricular size is normal.  3. Left atrial size was moderately dilated.  4. The mitral valve is abnormal. No evidence of mitral valve regurgitation. The mean mitral valve gradient is 3.0 mmHg with average heart rate of 75 bpm. Moderate mitral annular calcification.  5. The aortic valve is tricuspid. Aortic valve regurgitation is not visualized. Mild aortic valve sclerosis is present, with no evidence of aortic valve stenosis. Comparison(s): A prior study was performed on 01/15/2020. Prior images reviewed side by side. Similar to prior; tissues Doppler is diminished from prior. FINDINGS  Left Ventricle: Left ventricular ejection fraction, by estimation, is 60 to 65%. The left ventricle has normal function. The left ventricle has no regional wall motion abnormalities. The left ventricular internal cavity size was normal in size. There is  moderate concentric left ventricular hypertrophy. Left ventricular diastolic parameters are consistent with Grade II diastolic dysfunction (pseudonormalization). Elevated left atrial pressure. Right Ventricle: The right ventricular size is normal. No increase in right ventricular wall thickness. Right ventricular systolic function is normal. Left Atrium: Left atrial size was moderately dilated. Right Atrium: Right atrial size was normal in size. Pericardium: There is no evidence of pericardial effusion. Mitral Valve: The mitral valve is abnormal. Moderate mitral  annular calcification. No evidence of mitral valve regurgitation. MV peak gradient, 8.0 mmHg. The mean mitral valve gradient is 3.0 mmHg with average heart rate of 75 bpm. Tricuspid Valve: The tricuspid valve is normal in structure. Tricuspid valve regurgitation is not demonstrated. No evidence of tricuspid stenosis. Aortic Valve: The aortic valve is tricuspid. Aortic valve regurgitation is not visualized. Mild aortic valve sclerosis is present, with no evidence of aortic valve stenosis. Aortic valve mean gradient measures 10.0 mmHg. Aortic valve peak gradient measures  18.7 mmHg. Aortic valve area, by VTI measures 2.79 cm. Pulmonic Valve: The pulmonic valve was normal in structure. Pulmonic valve regurgitation is not visualized. No evidence of pulmonic stenosis. Aorta: The aortic root and ascending aorta are structurally normal, with no evidence of dilitation. IAS/Shunts: The atrial septum is grossly normal.  LEFT VENTRICLE PLAX 2D LVIDd:         4.50 cm  Diastology LVIDs:         2.90 cm  LV e' medial:    5.33 cm/s LV PW:         1.20 cm  LV E/e' medial:  18.6 LV IVS:        1.20 cm  LV e' lateral:   6.96 cm/s LVOT diam:     2.00 cm  LV E/e' lateral: 14.2 LV SV:         118 LV SV Index:   74 LVOT Area:     3.14 cm  RIGHT VENTRICLE RV Basal diam:  3.70 cm RV S prime:     16.60 cm/s TAPSE (M-mode): 3.1 cm LEFT ATRIUM             Index       RIGHT ATRIUM           Index LA diam:        4.10 cm 2.55 cm/m  RA Area:     12.80 cm LA Vol (A2C):   69.8 ml 43.49 ml/m RA Volume:   27.20 ml  16.95 ml/m LA Vol (A4C):   70.5 ml 43.93 ml/m LA Biplane Vol: 71.0 ml 44.24 ml/m  AORTIC VALVE AV Area (Vmax):    2.78 cm AV Area (Vmean):   2.70 cm AV Area (VTI):     2.79 cm AV Vmax:           216.00 cm/s AV Vmean:          142.000 cm/s AV VTI:            0.424 m AV Peak Grad:      18.7 mmHg AV Mean Grad:      10.0 mmHg LVOT Vmax:         191.00 cm/s LVOT Vmean:        122.000 cm/s LVOT VTI:          0.377 m LVOT/AV VTI ratio:  0.89  AORTA Ao Root diam: 2.80 cm Ao Asc diam:  3.50 cm MITRAL VALVE MV Area (PHT): 2.62 cm     SHUNTS MV Area VTI:   2.53 cm     Systemic VTI:  0.38 m MV Peak grad:  8.0 mmHg     Systemic Diam: 2.00 cm MV Mean grad:  3.0 mmHg MV Vmax:       1.41 m/s MV Vmean:      86.3 cm/s MV Decel Time: 289 msec MV E velocity: 99.10 cm/s MV A velocity: 144.00 cm/s MV E/A ratio:  0.69 Rudean Haskell MD Electronically signed by Rudean Haskell MD Signature Date/Time: 09/15/2020/5:21:05 PM    Final    IR ABDOMEN US LIMITED  Result Date: 09/18/2020 CLINICAL DATA:  Abdominal distension, assess for paracentesis EXAM: LIMITED ABDOMEN ULTRASOUND FOR ASCITES TECHNIQUE: Limited ultrasound survey for ascites was performed in all four abdominal quadrants. COMPARISON:  09/17/2020 FINDINGS: Survey of the abdominal 4 quadrants demonstrates trace amount ascites about the inferior liver. No significant large volume of ascites that warrants paracentesis. Procedure not performed. IMPRESSION: Trace abdominopelvic ascites by ultrasound Electronically Signed  By: Eugenie Filler M.D.   On: 09/18/2020 10:03   US LIVER DOPPLER  Result Date: 09/18/2020 CLINICAL DATA:  Evaluate TIPS stent. Stent was placed on 01/14/2020. EXAM: DUPLEX ULTRASOUND OF LIVER AND TIPS SHUNT TECHNIQUE: Color and duplex Doppler ultrasound was performed to evaluate the hepatic in-flow and out-flow vessels. COMPARISON:  None. FINDINGS: Portal Vein Velocities Main:  29 cm/sec Right:  17 cm/sec Left:  16 cm/sec TIPS Stent Velocities Portal: 88 cm/sec Mid:  77 Hepatic: 74 cm/sec IVC: Patent Hepatic Vein Velocities Right:  24 cm/sec Mid:  29 cm/sec Left:  36 cm/sec Splenic Vein: 28 cm/sec Superior Mesenteric Vein: 33 cm/sec Hepatic Artery: 115.8 cm/sec Ascities: Present Varices: Absent Other findings: Predominantly hepatopetal flow in the portal veins. TIPS stent is patent without focal velocity elevation. Normal hepatofugal flow in the hepatic veins. Small amount of  perihepatic ascites. IMPRESSION: 1. TIPS stent is patent.  No evidence for focal stenosis. 2. Small amount of perihepatic ascites. Electronically Signed   By: Markus Daft M.D.   On: 09/18/2020 07:44     Subjective: Burning pain has improved.   Discharge Exam: Vitals:   09/18/20 2111 09/19/20 0455  BP: (!) 125/56 125/63  Pulse: 83 82  Resp: 16 16  Temp: 98.4 F (36.9 C) 97.9 F (36.6 C)  SpO2: 97% 97%   Vitals:   09/18/20 1012 09/18/20 1326 09/18/20 2111 09/19/20 0455  BP: 122/61 (!) 117/56 (!) 125/56 125/63  Pulse: 78 80 83 82  Resp: 16 16 16 16   Temp: 98.2 F (36.8 C) 98.2 F (36.8 C) 98.4 F (36.9 C) 97.9 F (36.6 C)  TempSrc: Oral Oral Oral Oral  SpO2: 100% 97% 97% 97%  Weight:    58.6 kg  Height:        General: Pt is alert, awake, not in acute distress Cardiovascular: RRR, S1/S2 +, no rubs, no gallops Respiratory: CTA bilaterally, no wheezing, no rhonchi Abdominal: Soft, NT, ND, bowel sounds + Extremities: no edema, no cyanosis    The results of significant diagnostics from this hospitalization (including imaging, microbiology, ancillary and laboratory) are listed below for reference.     Microbiology: Recent Results (from the past 240 hour(s))  SARS CORONAVIRUS 2 (TAT 6-24 HRS) Nasopharyngeal Nasopharyngeal Swab     Status: None   Collection Time: 09/15/20  2:10 PM   Specimen: Nasopharyngeal Swab  Result Value Ref Range Status   SARS Coronavirus 2 NEGATIVE NEGATIVE Final    Comment: (NOTE) SARS-CoV-2 target nucleic acids are NOT DETECTED.  The SARS-CoV-2 RNA is generally detectable in upper and lower respiratory specimens during the acute phase of infection. Negative results do not preclude SARS-CoV-2 infection, do not rule out co-infections with other pathogens, and should not be used as the sole basis for treatment or other patient management decisions. Negative results must be combined with clinical observations, patient history, and  epidemiological information. The expected result is Negative.  Fact Sheet for Patients: SugarRoll.be  Fact Sheet for Healthcare Providers: https://www.woods-mathews.com/  This test is not yet approved or cleared by the [place] FDA and  has been authorized for detection and/or diagnosis of SARS-CoV-2 by FDA under an Emergency Use Authorization (EUA). This EUA will remain  in effect (meaning this test can be used) for the duration of the COVID-19 declaration under Se ction 564(b)(1) of the Act, 21 U.S.C. section 360bbb-3(b)(1), unless the authorization is terminated or revoked sooner.  Performed at [facility], [place] [address] [place], [place] [address]  Labs: BNP (last 3 results) No results for input(s): BNP in the last 8760 hours. Basic Metabolic Panel: Recent Labs  Lab 09/14/20 1948 09/15/20 0500 09/16/20 0350 09/17/20 0928 09/18/20 0323  NA 130* 133* 133* 131* 132*  K 4.8 4.3 4.5 4.2 4.4  CL 99 106 107 105 107  CO2 21* 22 20* 20* 20*  GLUCOSE 361* 217* 124* 215* 155*  BUN 23* 21* 20 17 14   CREATININE 1.75* 1.48* 1.54* 1.57* 1.47*  CALCIUM 10.4* 9.6 9.7 9.3 9.5   Liver Function Tests: Recent Labs  Lab 09/15/20 0209 09/16/20 0350 09/18/20 0323  AST 48* 36 38  ALT 30 25 21   ALKPHOS 104 87 81  BILITOT 4.5* 3.9* 3.1*  PROT 5.7* 5.0* 4.8*  ALBUMIN 2.9* 2.6* 2.8*   Recent Labs  Lab 09/15/20 0209  LIPASE 36   No results for input(s): AMMONIA in the last 168 hours. CBC: Recent Labs  Lab 09/14/20 1948 09/16/20 0350 09/18/20 0323  WBC 6.7 6.3 5.2  NEUTROABS  --   --  2.7  HGB 12.4 10.0* 9.2*  HCT 33.9* 27.8* 25.2*  MCV 89.0 90.0 89.7  PLT 128* PLATELET CLUMPS NOTED ON SMEAR, UNABLE TO ESTIMATE 100*   Cardiac Enzymes: No results for input(s): CKTOTAL, CKMB, CKMBINDEX, TROPONINI in the last 168 hours. BNP: Invalid input(s): POCBNP CBG: Recent Labs  Lab 09/18/20 1128 09/18/20 1604  09/18/20 2110 09/19/20 0635 09/19/20 0801  GLUCAP 240* 220* 248* 166* 236*   D-Dimer No results for input(s): DDIMER in the last 72 hours. Hgb A1c No results for input(s): HGBA1C in the last 72 hours. Lipid Profile No results for input(s): CHOL, HDL, LDLCALC, TRIG, CHOLHDL, LDLDIRECT in the last 72 hours. Thyroid function studies No results for input(s): TSH, T4TOTAL, T3FREE, THYROIDAB in the last 72 hours.  Invalid input(s): FREET3 Anemia work up No results for input(s): VITAMINB12, FOLATE, FERRITIN, TIBC, IRON, RETICCTPCT in the last 72 hours. Urinalysis    Component Value Date/Time   COLORURINE AMBER (A) 01/07/2020 1955   APPEARANCEUR HAZY (A) 01/07/2020 1955   LABSPEC 1.019 01/07/2020 1955   PHURINE 5.0 01/07/2020 1955   GLUCOSEU NEGATIVE 01/07/2020 1955   HGBUR MODERATE (A) 01/07/2020 1955   BILIRUBINUR negative 06/26/2020 1337   KETONESUR negative 06/26/2020 [place] 01/07/2020 1955   PROTEINUR NEGATIVE 01/07/2020 1955   UROBILINOGEN 2.0 (A) 06/26/2020 1337   NITRITE Negative 06/26/2020 1337   NITRITE NEGATIVE 01/07/2020 1955   LEUKOCYTESUR Negative 06/26/2020 1337   LEUKOCYTESUR LARGE (A) 01/07/2020 1955   Sepsis Labs Invalid input(s): PROCALCITONIN,  WBC,  LACTICIDVEN Microbiology Recent Results (from the past 240 hour(s))  SARS CORONAVIRUS 2 (TAT 6-24 HRS) Nasopharyngeal Nasopharyngeal Swab     Status: None   Collection Time: 09/15/20  2:10 PM   Specimen: Nasopharyngeal Swab  Result Value Ref Range Status   SARS Coronavirus 2 NEGATIVE NEGATIVE Final    Comment: (NOTE) SARS-CoV-2 target nucleic acids are NOT DETECTED.  The SARS-CoV-2 RNA is generally detectable in upper and lower respiratory specimens during the acute phase of infection. Negative results do not preclude SARS-CoV-2 infection, do not rule out co-infections with other pathogens, and should not be used as the sole basis for treatment or other patient management  decisions. Negative results must be combined with clinical observations, patient history, and epidemiological information. The expected result is Negative.  Fact Sheet for Patients: SugarRoll.be  Fact Sheet for Healthcare Providers: https://www.woods-mathews.com/  This test is not yet approved or cleared by  the [employer] and  has been authorized for detection and/or diagnosis of SARS-CoV-2 by FDA under an Emergency Use Authorization (EUA). This EUA will remain  in effect (meaning this test can be used) for the duration of the COVID-19 declaration under Se ction 564(b)(1) of the Act, 21 U.S.C. section 360bbb-3(b)(1), unless the authorization is terminated or revoked sooner.  Performed at [facility], [place] [address] [place], [place] [address]      Time coordinating discharge: 35 minutes.   SIGNED:   Hosie Poisson, MD  [facility] 09/19/2020, 11:01 AM

## 2020-09-21 ENCOUNTER — Telehealth: Payer: Self-pay | Admitting: General Practice

## 2020-09-21 ENCOUNTER — Other Ambulatory Visit (HOSPITAL_BASED_OUTPATIENT_CLINIC_OR_DEPARTMENT_OTHER): Payer: Self-pay

## 2020-09-21 NOTE — Telephone Encounter (Signed)
Transition Care Management Unsuccessful Follow-up Telephone Call  Date of discharge and from where:  [place] 09/19/20  Attempts:  1st Attempt  Reason for unsuccessful TCM follow-up call:  Left voice message

## 2020-09-22 NOTE — Telephone Encounter (Signed)
Transition Care Management Unsuccessful Follow-up Telephone Call  Date of discharge and from where:  [facility]/20  Attempts:  2nd Attempt  Reason for unsuccessful TCM follow-up call:  Left voice message

## 2020-09-23 NOTE — Telephone Encounter (Signed)
Transition Care Management Unsuccessful Follow-up Telephone Call  Date of discharge and from where:  [facility]  Attempts:  3rd Attempt  Reason for unsuccessful TCM follow-up call:  Left voice message

## 2020-09-24 NOTE — Telephone Encounter (Signed)
Lm on vm for patient to return call 

## 2020-10-07 NOTE — Telephone Encounter (Signed)
Lm on vm for patient to return call 

## 2020-10-09 NOTE — Telephone Encounter (Signed)
No return call received. Will mail letter.

## 2020-10-23 ENCOUNTER — Other Ambulatory Visit (HOSPITAL_BASED_OUTPATIENT_CLINIC_OR_DEPARTMENT_OTHER): Payer: Self-pay

## 2020-10-23 MED FILL — Insulin Pen Needle 32 G X 4 MM (1/6" or 5/32"): 90 days supply | Qty: 100 | Fill #0 | Status: AC

## 2020-10-23 MED FILL — Pantoprazole Sodium EC Tab 40 MG (Base Equiv): ORAL | 90 days supply | Qty: 90 | Fill #0 | Status: AC

## 2020-10-27 ENCOUNTER — Other Ambulatory Visit: Payer: Self-pay | Admitting: Interventional Radiology

## 2020-10-27 ENCOUNTER — Ambulatory Visit: Payer: No Typology Code available for payment source | Admitting: Osteopathic Medicine

## 2020-10-27 DIAGNOSIS — R188 Other ascites: Secondary | ICD-10-CM

## 2020-10-27 DIAGNOSIS — K7460 Unspecified cirrhosis of liver: Secondary | ICD-10-CM

## 2020-11-26 ENCOUNTER — Other Ambulatory Visit (HOSPITAL_BASED_OUTPATIENT_CLINIC_OR_DEPARTMENT_OTHER): Payer: Self-pay

## 2020-12-01 ENCOUNTER — Other Ambulatory Visit (HOSPITAL_BASED_OUTPATIENT_CLINIC_OR_DEPARTMENT_OTHER): Payer: Self-pay

## 2020-12-29 ENCOUNTER — Telehealth: Payer: Self-pay

## 2020-12-29 DIAGNOSIS — K7460 Unspecified cirrhosis of liver: Secondary | ICD-10-CM

## 2020-12-29 DIAGNOSIS — K7581 Nonalcoholic steatohepatitis (NASH): Secondary | ICD-10-CM

## 2020-12-29 DIAGNOSIS — R188 Other ascites: Secondary | ICD-10-CM

## 2020-12-29 NOTE — Telephone Encounter (Signed)
Patient due for RUQ U/S for Cirrhosis in mid September.  Scheduled for 9-13 Tuesday at 9:00am, NPO after midnight arr at 8:45 am.  MyChart message sent to patient

## 2020-12-29 NOTE — Telephone Encounter (Signed)
-----   Message from Roetta Sessions, [place] sent at 07/20/2020  8:11 AM EDT ----- Regarding: U/S due in 12-2020 Patient due for U/S  for [facility] screening in mid Sept

## 2021-01-08 ENCOUNTER — Emergency Department (HOSPITAL_COMMUNITY): Payer: Self-pay

## 2021-01-08 ENCOUNTER — Encounter (HOSPITAL_COMMUNITY): Payer: Self-pay | Admitting: Emergency Medicine

## 2021-01-08 ENCOUNTER — Other Ambulatory Visit: Payer: Self-pay

## 2021-01-08 ENCOUNTER — Inpatient Hospital Stay (HOSPITAL_COMMUNITY)
Admission: EM | Admit: 2021-01-08 | Discharge: 2021-01-12 | DRG: 442 | Disposition: A | Discharge status: Rehabilitation Facility | Payer: Self-pay | Attending: Internal Medicine | Admitting: Internal Medicine

## 2021-01-08 DIAGNOSIS — I8500 Esophageal varices without bleeding: Secondary | ICD-10-CM | POA: Diagnosis present

## 2021-01-08 DIAGNOSIS — I152 Hypertension secondary to endocrine disorders: Secondary | ICD-10-CM | POA: Diagnosis present

## 2021-01-08 DIAGNOSIS — M19071 Primary osteoarthritis, right ankle and foot: Secondary | ICD-10-CM | POA: Diagnosis present

## 2021-01-08 DIAGNOSIS — J302 Other seasonal allergic rhinitis: Secondary | ICD-10-CM | POA: Diagnosis present

## 2021-01-08 DIAGNOSIS — E1169 Type 2 diabetes mellitus with other specified complication: Secondary | ICD-10-CM | POA: Diagnosis present

## 2021-01-08 DIAGNOSIS — Z882 Allergy status to sulfonamides status: Secondary | ICD-10-CM

## 2021-01-08 DIAGNOSIS — G372 Central pontine myelinolysis: Secondary | ICD-10-CM | POA: Diagnosis present

## 2021-01-08 DIAGNOSIS — K429 Umbilical hernia without obstruction or gangrene: Secondary | ICD-10-CM | POA: Diagnosis present

## 2021-01-08 DIAGNOSIS — K219 Gastro-esophageal reflux disease without esophagitis: Secondary | ICD-10-CM | POA: Diagnosis present

## 2021-01-08 DIAGNOSIS — Z9049 Acquired absence of other specified parts of digestive tract: Secondary | ICD-10-CM

## 2021-01-08 DIAGNOSIS — Z794 Long term (current) use of insulin: Secondary | ICD-10-CM

## 2021-01-08 DIAGNOSIS — K7581 Nonalcoholic steatohepatitis (NASH): Secondary | ICD-10-CM | POA: Diagnosis present

## 2021-01-08 DIAGNOSIS — Z79899 Other long term (current) drug therapy: Secondary | ICD-10-CM

## 2021-01-08 DIAGNOSIS — E785 Hyperlipidemia, unspecified: Secondary | ICD-10-CM | POA: Diagnosis present

## 2021-01-08 DIAGNOSIS — Z23 Encounter for immunization: Secondary | ICD-10-CM

## 2021-01-08 DIAGNOSIS — K7460 Unspecified cirrhosis of liver: Secondary | ICD-10-CM | POA: Diagnosis present

## 2021-01-08 DIAGNOSIS — E1159 Type 2 diabetes mellitus with other circulatory complications: Secondary | ICD-10-CM | POA: Diagnosis present

## 2021-01-08 DIAGNOSIS — D638 Anemia in other chronic diseases classified elsewhere: Secondary | ICD-10-CM | POA: Diagnosis present

## 2021-01-08 DIAGNOSIS — R066 Hiccough: Secondary | ICD-10-CM | POA: Diagnosis present

## 2021-01-08 DIAGNOSIS — M19072 Primary osteoarthritis, left ankle and foot: Secondary | ICD-10-CM | POA: Diagnosis present

## 2021-01-08 DIAGNOSIS — K7682 Hepatic encephalopathy: Secondary | ICD-10-CM | POA: Diagnosis present

## 2021-01-08 DIAGNOSIS — Z886 Allergy status to analgesic agent status: Secondary | ICD-10-CM

## 2021-01-08 DIAGNOSIS — H5461 Unqualified visual loss, right eye, normal vision left eye: Secondary | ICD-10-CM | POA: Diagnosis present

## 2021-01-08 DIAGNOSIS — Z792 Long term (current) use of antibiotics: Secondary | ICD-10-CM

## 2021-01-08 DIAGNOSIS — D6959 Other secondary thrombocytopenia: Secondary | ICD-10-CM | POA: Diagnosis present

## 2021-01-08 DIAGNOSIS — Z20822 Contact with and (suspected) exposure to covid-19: Secondary | ICD-10-CM | POA: Diagnosis present

## 2021-01-08 DIAGNOSIS — Z880 Allergy status to penicillin: Secondary | ICD-10-CM

## 2021-01-08 DIAGNOSIS — E1165 Type 2 diabetes mellitus with hyperglycemia: Secondary | ICD-10-CM | POA: Diagnosis present

## 2021-01-08 DIAGNOSIS — Z888 Allergy status to other drugs, medicaments and biological substances status: Secondary | ICD-10-CM

## 2021-01-08 DIAGNOSIS — I8510 Secondary esophageal varices without bleeding: Secondary | ICD-10-CM | POA: Diagnosis present

## 2021-01-08 DIAGNOSIS — K5900 Constipation, unspecified: Secondary | ICD-10-CM | POA: Diagnosis present

## 2021-01-08 DIAGNOSIS — Z833 Family history of diabetes mellitus: Secondary | ICD-10-CM

## 2021-01-08 DIAGNOSIS — D509 Iron deficiency anemia, unspecified: Secondary | ICD-10-CM | POA: Diagnosis present

## 2021-01-08 DIAGNOSIS — G8191 Hemiplegia, unspecified affecting right dominant side: Secondary | ICD-10-CM | POA: Diagnosis present

## 2021-01-08 DIAGNOSIS — R471 Dysarthria and anarthria: Secondary | ICD-10-CM | POA: Diagnosis present

## 2021-01-08 DIAGNOSIS — R188 Other ascites: Secondary | ICD-10-CM

## 2021-01-08 DIAGNOSIS — Z8249 Family history of ischemic heart disease and other diseases of the circulatory system: Secondary | ICD-10-CM

## 2021-01-08 DIAGNOSIS — K7290 Hepatic failure, unspecified without coma: Principal | ICD-10-CM | POA: Diagnosis present

## 2021-01-08 DIAGNOSIS — M4802 Spinal stenosis, cervical region: Secondary | ICD-10-CM | POA: Diagnosis present

## 2021-01-08 LAB — URINALYSIS, ROUTINE W REFLEX MICROSCOPIC
Bilirubin Urine: NEGATIVE
Glucose, UA: NEGATIVE mg/dL
Hgb urine dipstick: NEGATIVE
Ketones, ur: NEGATIVE mg/dL
Nitrite: NEGATIVE
Protein, ur: NEGATIVE mg/dL
Specific Gravity, Urine: 1.02 (ref 1.005–1.030)
pH: 6 (ref 5.0–8.0)

## 2021-01-08 LAB — GLUCOSE, CAPILLARY: Glucose-Capillary: 285 mg/dL — ABNORMAL HIGH (ref 70–99)

## 2021-01-08 LAB — COMPREHENSIVE METABOLIC PANEL
ALT: 34 U/L (ref 0–44)
AST: 54 U/L — ABNORMAL HIGH (ref 15–41)
Albumin: 3.2 g/dL — ABNORMAL LOW (ref 3.5–5.0)
Alkaline Phosphatase: 126 U/L (ref 38–126)
Anion gap: 11 (ref 5–15)
BUN: 15 mg/dL (ref 6–20)
CO2: 23 mmol/L (ref 22–32)
Calcium: 10.2 mg/dL (ref 8.9–10.3)
Chloride: 105 mmol/L (ref 98–111)
Creatinine, Ser: 0.94 mg/dL (ref 0.44–1.00)
GFR, Estimated: >60 mL/min (ref >60)
Glucose, Bld: 348 mg/dL — ABNORMAL HIGH (ref 70–99)
Potassium: 4.5 mmol/L (ref 3.5–5.1)
Sodium: 139 mmol/L (ref 135–145)
Total Bilirubin: 5.2 mg/dL — ABNORMAL HIGH (ref 0.3–1.2)
Total Protein: 6 g/dL — ABNORMAL LOW (ref 6.5–8.1)

## 2021-01-08 LAB — CBC WITH DIFFERENTIAL/PLATELET
Abs Immature Granulocytes: 0.02 10*3/uL (ref 0.00–0.07)
Basophils Absolute: 0.1 10*3/uL (ref 0.0–0.1)
Basophils Relative: 1 %
Eosinophils Absolute: 0.3 10*3/uL (ref 0.0–0.5)
Eosinophils Relative: 5 %
HCT: 36.5 % (ref 36.0–46.0)
Hemoglobin: 13.2 g/dL (ref 12.0–15.0)
Immature Granulocytes: 0 %
Lymphocytes Relative: 25 %
Lymphs Abs: 1.3 10*3/uL (ref 0.7–4.0)
MCH: 32.9 pg (ref 26.0–34.0)
MCHC: 36.2 g/dL — ABNORMAL HIGH (ref 30.0–36.0)
MCV: 91 fL (ref 80.0–100.0)
Monocytes Absolute: 0.4 10*3/uL (ref 0.1–1.0)
Monocytes Relative: 7 %
Neutro Abs: 3.3 10*3/uL (ref 1.7–7.7)
Neutrophils Relative %: 62 %
Platelets: 110 10*3/uL — ABNORMAL LOW (ref 150–400)
RBC: 4.01 MIL/uL (ref 3.87–5.11)
RDW: 14.5 % (ref 11.5–15.5)
WBC: 5.3 10*3/uL (ref 4.0–10.5)
nRBC: 0 % (ref 0.0–0.2)

## 2021-01-08 LAB — RESP PANEL BY RT-PCR (FLU A&B, COVID) ARPGX2
Influenza A by PCR: NEGATIVE
Influenza B by PCR: NEGATIVE
SARS Coronavirus 2 by RT PCR: NEGATIVE

## 2021-01-08 LAB — URINALYSIS, MICROSCOPIC (REFLEX)
Bacteria, UA: NONE SEEN
RBC / HPF: NONE SEEN RBC/hpf (ref 0–5)
Squamous Epithelial / HPF: NONE SEEN (ref 0–5)
WBC, UA: NONE SEEN WBC/hpf (ref 0–5)

## 2021-01-08 LAB — AMMONIA: Ammonia: 83 umol/L — ABNORMAL HIGH (ref 9–35)

## 2021-01-08 MED ORDER — LORATADINE 10 MG PO TABS
10.0000 mg | ORAL_TABLET | Freq: Every day | ORAL | Status: DC
Start: 2021-01-08 — End: 2021-01-12
  Administered 2021-01-08 – 2021-01-12 (×3): 10 mg via ORAL
  Filled 2021-01-08 (×5): qty 1

## 2021-01-08 MED ORDER — DEXTROSE-NACL 5-0.45 % IV SOLN: INTRAVENOUS | Status: DC

## 2021-01-08 MED ORDER — PANTOPRAZOLE SODIUM 40 MG PO TBEC
40.0000 mg | DELAYED_RELEASE_TABLET | Freq: Every day | ORAL | Status: DC
  Administered 2021-01-09 – 2021-01-12 (×4): 40 mg via ORAL
  Filled 2021-01-08 (×4): qty 1

## 2021-01-08 MED ORDER — INSULIN DEGLUDEC 200 UNIT/ML ~~LOC~~ SOPN: PEN_INJECTOR | Freq: Every day | SUBCUTANEOUS | Status: DC

## 2021-01-08 MED ORDER — ADULT MULTIVITAMIN W/MINERALS CH
1.0000 | ORAL_TABLET | Freq: Every day | ORAL | Status: DC
  Administered 2021-01-09 – 2021-01-12 (×4): 1 via ORAL
  Filled 2021-01-08 (×5): qty 1

## 2021-01-08 MED ORDER — ONDANSETRON HCL 4 MG PO TABS: 4.0000 mg | ORAL_TABLET | Freq: Four times a day (QID) | ORAL | Status: DC | PRN

## 2021-01-08 MED ORDER — LACTULOSE 10 GM/15ML PO SOLN
30.0000 g | Freq: Two times a day (BID) | ORAL | Status: DC
  Administered 2021-01-08 – 2021-01-10 (×4): 30 g via ORAL
  Filled 2021-01-08 (×4): qty 45

## 2021-01-08 MED ORDER — ONDANSETRON HCL 4 MG/2ML IJ SOLN: 4.0000 mg | Freq: Four times a day (QID) | INTRAMUSCULAR | Status: DC | PRN

## 2021-01-08 MED ORDER — VITAMIN D 25 MCG (1000 UNIT) PO TABS
1000.0000 [IU] | ORAL_TABLET | Freq: Every day | ORAL | Status: DC
  Administered 2021-01-08 – 2021-01-11 (×4): 1000 [IU] via ORAL
  Filled 2021-01-08 (×4): qty 1

## 2021-01-08 MED ORDER — FERROUS SULFATE 325 (65 FE) MG PO TABS
325.0000 mg | ORAL_TABLET | Freq: Two times a day (BID) | ORAL | Status: DC
  Administered 2021-01-09 – 2021-01-12 (×7): 325 mg via ORAL
  Filled 2021-01-08 (×7): qty 1

## 2021-01-08 MED ORDER — ACETAMINOPHEN 500 MG PO TABS
500.0000 mg | ORAL_TABLET | Freq: Four times a day (QID) | ORAL | Status: DC | PRN
  Administered 2021-01-12: 1000 mg via ORAL
  Filled 2021-01-08 (×2): qty 2

## 2021-01-08 MED ORDER — INSULIN ASPART 100 UNIT/ML IJ SOLN
0.0000 [IU] | Freq: Three times a day (TID) | INTRAMUSCULAR | Status: DC
  Administered 2021-01-09: 7 [IU] via SUBCUTANEOUS
  Administered 2021-01-09 – 2021-01-10 (×3): 9 [IU] via SUBCUTANEOUS
  Administered 2021-01-10: 3 [IU] via SUBCUTANEOUS
  Administered 2021-01-10: 9 [IU] via SUBCUTANEOUS
  Administered 2021-01-11: 3 [IU] via SUBCUTANEOUS
  Administered 2021-01-11: 5 [IU] via SUBCUTANEOUS
  Administered 2021-01-11 – 2021-01-12 (×2): 1 [IU] via SUBCUTANEOUS
  Filled 2021-01-08: qty 0.09

## 2021-01-08 MED ORDER — INSULIN GLARGINE-YFGN 100 UNIT/ML ~~LOC~~ SOLN
20.0000 [IU] | Freq: Every day | SUBCUTANEOUS | Status: DC
  Administered 2021-01-09: 20 [IU] via SUBCUTANEOUS
  Filled 2021-01-08: qty 0.2

## 2021-01-08 MED ORDER — LACTULOSE 10 GM/15ML PO SOLN
30.0000 g | Freq: Once | ORAL | Status: AC
  Administered 2021-01-08: 30 g via ORAL
  Filled 2021-01-08: qty 60

## 2021-01-08 MED ORDER — INFLUENZA VAC SPLIT QUAD 0.5 ML IM SUSY
0.5000 mL | PREFILLED_SYRINGE | INTRAMUSCULAR | Status: AC
  Administered 2021-01-09: 0.5 mL via INTRAMUSCULAR
  Filled 2021-01-08: qty 0.5

## 2021-01-08 MED ORDER — INSULIN ASPART 100 UNIT/ML IJ SOLN
0.0000 [IU] | Freq: Every day | INTRAMUSCULAR | Status: DC
  Administered 2021-01-08: 3 [IU] via SUBCUTANEOUS
  Administered 2021-01-09: 5 [IU] via SUBCUTANEOUS
  Administered 2021-01-10 – 2021-01-11 (×2): 3 [IU] via SUBCUTANEOUS
  Filled 2021-01-08: qty 0.05

## 2021-01-08 MED ORDER — CIPROFLOXACIN HCL 500 MG PO TABS
500.0000 mg | ORAL_TABLET | Freq: Every day | ORAL | Status: DC
  Administered 2021-01-09 – 2021-01-12 (×4): 500 mg via ORAL
  Filled 2021-01-08 (×4): qty 1

## 2021-01-08 NOTE — ED Triage Notes (Signed)
Reports she started having slurred speech a week ago, reports her head is spinning and she is having generalized weakness since this morning.

## 2021-01-08 NOTE — H&P (Signed)
History and Physical   Meagan Peck XIP:[id number] DOB: 25-Dec-1960 DOA: 01/08/2021  Referring MD/NP/PA: Dr. Zenia Resides  PCP: Pcp, No   Outpatient Specialists: None  Patient coming from: Home  Chief Complaint: Slurred speech and confusion  HPI: Meagan Peck is a 60 y.o. female with medical history significant of [name] cirrhosis with previous hepatic encephalopathy, diabetes, essential hypertension, hyperlipidemia, esophageal varices, GERD who was brought in by family secondary to confusion weakness for about a week.  Also having slurred speech.  Patient has had previous hepatic encephalopathy and suspected to have another 1.  Denied any fall or head injury.  No fever or chill.  Patient has been compliant with her medications including lactulose.  Her ammonia today is 83.  She has had similar episode before and turned out to be due to hepatic encephalopathy.  She is currently being admitted with suspicion of another episode of hepatic encephalopathy..  ED Course: Temperature 98.7, blood pressure 115/68, pulse 59, respirate of 24 and oxygen sat 100% on room air.  CBC and chemistry largely within normal except platelets 110.  LFTs showed albumin of 3.2.  Total bilirubin is 5.2 and ammonia 83.  Urinalysis essentially negative.  Influenza and COVID-19 screen are negative.  Head CT without contrast negative.  Patient being admitted for management of hepatic encephalopathy.  Review of Systems: As per HPI otherwise 10 point review of systems negative.    Past Medical History:  Diagnosis Date   Acute medial meniscus tear of right knee 12/10/2015   Allergy    Anasarca    Arthritis    bil feet   Ascites    Cataract    bilateral - surgery to remove   Cirrhosis ([place])    Diabetes mellitus without complication ([place])    type 2 - last a1c was 5.2 per patient, no meds   Dyspnea    when I have too much fluid   Esophageal varices (HCC)    GERD (gastroesophageal reflux disease)    Heart murmur     Hepatic cirrhosis (HCC)    HLD (hyperlipidemia)    no meds   Hypertension    no meds   Iron deficiency anemia 09/26/2018    Past Surgical History:  Procedure Laterality Date   BIOPSY  08/20/2019   Procedure: BIOPSY;  Surgeon: Jackquline Denmark, MD;  Location: [place];  Service: Gastroenterology;;   CARPAL TUNNEL RELEASE Right    CESAREAN SECTION     CHOLECYSTECTOMY     COLONOSCOPY  08/2019   ESOPHAGEAL BANDING N/A 09/19/2019   Procedure: ESOPHAGEAL BANDING;  Surgeon: Yetta Flock, MD;  Location: [facility];  Service: Gastroenterology;  Laterality: N/A;   ESOPHAGOGASTRODUODENOSCOPY (EGD) WITH PROPOFOL N/A 08/20/2019   Procedure: ESOPHAGOGASTRODUODENOSCOPY (EGD) WITH PROPOFOL;  Surgeon: Jackquline Denmark, MD;  Location: [place];  Service: Gastroenterology;  Laterality: N/A;   ESOPHAGOGASTRODUODENOSCOPY (EGD) WITH PROPOFOL N/A 09/19/2019   Procedure: ESOPHAGOGASTRODUODENOSCOPY (EGD) WITH PROPOFOL;  Surgeon: Yetta Flock, MD;  Location: [facility];  Service: Gastroenterology;  Laterality: N/A;   ESOPHAGOGASTRODUODENOSCOPY (EGD) WITH PROPOFOL N/A 01/16/2020   Procedure: ESOPHAGOGASTRODUODENOSCOPY (EGD) WITH PROPOFOL;  Surgeon: Jerene Bears, MD;  Location: [place] [facility];  Service: Endoscopy;  Laterality: N/A;   EYE SURGERY Bilateral    removed cataracts   GASTRIC VARICES BANDING N/A 08/20/2019   Procedure: GASTRIC VARICES BANDING;  Surgeon: Jackquline Denmark, MD;  Location: [place];  Service: Gastroenterology;  Laterality: N/A;   HOT HEMOSTASIS N/A 01/16/2020   Procedure: HOT HEMOSTASIS (ARGON  PLASMA COAGULATION/BICAP);  Surgeon: Jerene Bears, MD;  Location: [facility];  Service: Endoscopy;  Laterality: N/A;   IR PARACENTESIS  08/01/2019   IR PARACENTESIS  08/19/2019   IR PARACENTESIS  09/09/2019   IR PARACENTESIS  09/23/2019   IR PARACENTESIS  10/08/2019   IR PARACENTESIS  10/23/2019   IR PARACENTESIS  11/12/2019   IR PARACENTESIS  11/21/2019   IR PARACENTESIS  11/25/2019    IR PARACENTESIS  12/05/2019   IR PARACENTESIS  12/13/2019   IR PARACENTESIS  12/20/2019   IR PARACENTESIS  12/26/2019   IR PARACENTESIS  12/31/2019   IR PARACENTESIS  01/07/2020   IR PARACENTESIS  01/10/2020   IR PARACENTESIS  01/13/2020   IR PARACENTESIS  01/14/2020   IR PARACENTESIS  01/20/2020   IR PARACENTESIS  01/30/2020   IR PARACENTESIS  02/24/2020   IR RADIOLOGIST EVAL & MGMT  10/30/2019   IR RADIOLOGIST EVAL & MGMT  12/11/2019   IR RADIOLOGIST EVAL & MGMT  12/24/2019   IR RADIOLOGIST EVAL & MGMT  03/05/2020   IR RADIOLOGIST EVAL & MGMT  05/15/2020   IR TIPS  11/25/2019   IR TIPS  01/14/2020   KNEE ARTHROSCOPY Right 12/10/2015   Procedure: RIGHT KNEE ARTHROSCOPY WITH PARTIAL MEDIAL MENISCECTOMY;  Surgeon: Mcarthur Rossetti, MD;  Location: [facility];  Service: Orthopedics;  Laterality: Right;   KNEE ARTHROSCOPY W/ MENISCAL REPAIR Bilateral    MENISCUS REPAIR Left    RADIOLOGY WITH ANESTHESIA N/A 11/25/2019   Procedure: TIPS;  Surgeon: Corrie Mckusick, DO;  Location: [place];  Service: Anesthesiology;  Laterality: N/A;   RADIOLOGY WITH ANESTHESIA N/A 01/14/2020   Procedure: TIPS;  Surgeon: Corrie Mckusick, DO;  Location: [place];  Service: Anesthesiology;  Laterality: N/A;     reports that she has never smoked. She has never used smokeless tobacco. She reports that she does not drink alcohol and does not use drugs.  Allergies  Allergen Reactions   Penicillins Anaphylaxis and Other (See Comments)    Happened as a young child   Atrovent [Ipratropium] Palpitations   Naproxen Rash   Quinine Derivatives Rash   Sulfa Antibiotics Rash    Family History  Problem Relation Age of Onset   Hypertension Mother    COPD Mother    Alcoholism Father    Heart attack Father    Diabetes Brother    Diabetes Paternal Grandmother    Heart disease Paternal Grandmother    Alcoholism Paternal Uncle    Alcoholism Maternal Uncle    Heart disease Maternal Grandmother    Heart disease Maternal Grandfather    Heart  disease Paternal Grandfather      Prior to Admission medications   Medication Sig Start Date End Date Taking? Authorizing Provider  acetaminophen (TYLENOL) 500 MG tablet Take 500-1,000 mg by mouth every 6 (six) hours as needed for moderate pain, headache or mild pain.   Yes [provider]  cetirizine (ZYRTEC) 10 MG tablet Take 1 tablet (10 mg total) by mouth daily. Patient taking differently: Take 10 mg by mouth daily as needed for allergies. 01/02/19  Yes [name], DO  Cholecalciferol (VITAMIN D3) 5000 UNITS TABS Take 5,000 Units by mouth at bedtime.   Yes [provider]  ciprofloxacin (CIPRO) 500 MG tablet Take 500 mg by mouth daily with breakfast.   Yes [provider]  ferrous sulfate 325 (65 FE) MG EC tablet Take 1 tablet (325 mg total) by mouth 2 (two) times daily with a  meal. 09/26/18  Yes Alexander, Lanelle Bal, DO  insulin degludec (TRESIBA) 200 UNIT/ML FlexTouch Pen INJECT 10 - 50 UNITS INTO THE SKIN DAILY. TARGET TO FASTING GLUCOSE 150. Patient taking differently: Inject 20 Units into the skin daily after breakfast. 06/29/20 06/29/21 Yes Alexander, Lanelle Bal, DO  insulin lispro (HUMALOG) 200 UNIT/ML KwikPen INJECT 10 - 15 UNITS INTO THE SKIN 3 TIMES DAILY BEFORE MEALS Patient taking differently: Inject 20 Units into the skin 3 (three) times daily after meals. 07/09/20 07/09/21 Yes [name], DO  Krill Oil 500 MG CAPS Take 1,000 mg by mouth 2 (two) times daily.   Yes [provider]  lactulose, encephalopathy, (CHRONULAC) 10 GM/15ML SOLN Take 30 mLs (20 g total) by mouth 2 (two) times daily. 09/19/20  Yes Hosie Poisson, MD  Multiple Vitamin (MULTI-DAY PO) Take 1 tablet by mouth daily.   Yes [provider]  pantoprazole (PROTONIX) 40 MG tablet TAKE 1 TABLET (40 MG TOTAL) BY MOUTH DAILY. Patient taking differently: Take 40 mg by mouth daily before breakfast. 02/17/20 02/16/21 Yes Armbruster, Carlota Raspberry, MD  ACCU-CHEK FASTCLIX LANCETS  MISC Check fsbs TID 08/09/16   [provider]  AMBULATORY NON FORMULARY MEDICATION Wedge pillow 01/27/20   Early, Coralee Pesa, NP  Insulin Pen Needle 32G X 4 MM MISC USE TO INJECT TRESIBA 06/29/20 06/29/21  [name], DO  magnesium oxide (MAG-OX) 400 (240 Mg) MG tablet TAKE 1 TABLET BY MOUTH TWICE DAILY Patient not taking: No sig reported 09/03/20 09/03/21  [name], DO    Physical Exam: Vitals:   01/08/21 1815 01/08/21 1830 01/08/21 1845 01/08/21 1900  BP: (!) 132/55 (!) 137/56 (!) 134/56 (!) 140/57  Pulse: 65 63 64 63  Resp: (!) 21 19 (!) 21 20  Temp:      TempSrc:      SpO2: 100% 100% 100% 100%  Weight:      Height:          Constitutional: Chronically ill looking no distress Vitals:   01/08/21 1815 01/08/21 1830 01/08/21 1845 01/08/21 1900  BP: (!) 132/55 (!) 137/56 (!) 134/56 (!) 140/57  Pulse: 65 63 64 63  Resp: (!) 21 19 (!) 21 20  Temp:      TempSrc:      SpO2: 100% 100% 100% 100%  Weight:      Height:       Eyes: PERRL, lids and conjunctivae normal ENMT: Mucous membranes are moist. Posterior pharynx clear of any exudate or lesions.Normal dentition.  Neck: normal, supple, no masses, no thyromegaly Respiratory: clear to auscultation bilaterally, no wheezing, no crackles. Normal respiratory effort. No accessory muscle use.  Cardiovascular: Regular rate and rhythm, no murmurs / rubs / gallops. No extremity edema. 2+ pedal pulses. No carotid bruits.  Abdomen: Distended with mild ascites, no significant tenderness bowel sounds positive.  Musculoskeletal: no clubbing / cyanosis. No joint deformity upper and lower extremities. Good ROM, no contractures. Normal muscle tone.  Skin: no rashes, lesions, ulcers. No induration Neurologic: CN 2-12 grossly intact. Sensation intact, DTR normal. Strength 5/5 in all 4.  Psychiatric: Confused and disoriented in person place and time.     Labs on Admission: I have personally reviewed following labs and imaging  studies  CBC: Recent Labs  Lab 01/08/21 1634  WBC 5.3  NEUTROABS 3.3  HGB 13.2  HCT 36.5  MCV 91.0  PLT 681*   Basic Metabolic Panel: Recent Labs  Lab 01/08/21 1634  NA 139  K 4.5  CL 105  CO2 23  GLUCOSE 348*  BUN 15  CREATININE 0.94  CALCIUM 10.2   GFR: Estimated Creatinine Clearance: 49.5 mL/min (by C-G formula based on SCr of 0.94 mg/dL). Liver Function Tests: Recent Labs  Lab 01/08/21 1634  AST 54*  ALT 34  ALKPHOS 126  BILITOT 5.2*  PROT 6.0*  ALBUMIN 3.2*   No results for input(s): LIPASE, AMYLASE in the last 168 hours. Recent Labs  Lab 01/08/21 1634  AMMONIA 83*   Coagulation Profile: No results for input(s): INR, PROTIME in the last 168 hours. Cardiac Enzymes: No results for input(s): CKTOTAL, CKMB, CKMBINDEX, TROPONINI in the last 168 hours. BNP (last 3 results) No results for input(s): PROBNP in the last 8760 hours. HbA1C: No results for input(s): HGBA1C in the last 72 hours. CBG: No results for input(s): GLUCAP in the last 168 hours. Lipid Profile: No results for input(s): CHOL, HDL, LDLCALC, TRIG, CHOLHDL, LDLDIRECT in the last 72 hours. Thyroid Function Tests: No results for input(s): TSH, T4TOTAL, FREET4, T3FREE, THYROIDAB in the last 72 hours. Anemia Panel: No results for input(s): VITAMINB12, FOLATE, FERRITIN, TIBC, IRON, RETICCTPCT in the last 72 hours. Urine analysis:    Component Value Date/Time   COLORURINE AMBER (A) 01/07/2020 1955   APPEARANCEUR HAZY (A) 01/07/2020 1955   LABSPEC 1.019 01/07/2020 1955   PHURINE 5.0 01/07/2020 1955   GLUCOSEU NEGATIVE 01/07/2020 1955   HGBUR MODERATE (A) 01/07/2020 1955   BILIRUBINUR negative 06/26/2020 1337   KETONESUR negative 06/26/2020 [place] 01/07/2020 1955   PROTEINUR NEGATIVE 01/07/2020 1955   UROBILINOGEN 2.0 (A) 06/26/2020 1337   NITRITE Negative 06/26/2020 1337   NITRITE NEGATIVE 01/07/2020 1955   LEUKOCYTESUR Negative 06/26/2020 1337   LEUKOCYTESUR LARGE  (A) 01/07/2020 1955   Sepsis Labs: @LABRCNTIP (procalcitonin:4,lacticidven:4) )No results found for this or any previous visit (from the past 240 hour(s)).   Radiological Exams on Admission: CT Head Wo Contrast  Result Date: 01/08/2021 CLINICAL DATA:  Slurred speech, unsteady gait, weakness EXAM: CT HEAD WITHOUT CONTRAST TECHNIQUE: Contiguous axial images were obtained from the base of the skull through the vertex without intravenous contrast. COMPARISON:  None. FINDINGS: Brain: No acute infarct or hemorrhage. Lateral ventricles and midline structures are unremarkable. No acute extra-axial fluid collections. No mass effect. Vascular: No hyperdense vessel. Mild diffuse atherosclerosis of the internal carotid arteries. Skull: Normal. Negative for fracture or focal lesion. Sinuses/Orbits: No acute finding. Other: None. IMPRESSION: 1. No acute intracranial process. Electronically Signed   By: Randa Ngo M.D.   On: 01/08/2021 17:57    EKG: Independently reviewed.  Sinus tachycardia  Assessment/Plan Principal Problem:   Hepatic encephalopathy (HCC) Active Problems:   Hyperlipidemia associated with type 2 diabetes mellitus (HCC)   Gastroesophageal reflux disease   Hypertension associated with diabetes ([place])   Esophageal varices without bleeding (HCC)   Liver cirrhosis secondary to NASH [facility])     #1 hepatic encephalopathy: Patient will be admitted for inpatient treatment.  Increase lactulose dose and monitor response.  Follow ammonia level.  Currently at 95.  Resume other home regimen.  #2 NASH cirrhosis: Chronic.  Follow GI recommendations  #3 GERD: Continue with PPIs  #4 essential hypertension: Confirm and resume home regimen.  #5 hyperlipidemia: Confirm and resume home regimen.  #6 thrombocytopenia: Secondary to cirrhosis   DVT prophylaxis: SCD Code Status: Full code Family Communication: None at bedside Disposition Plan: Home Consults called: None none Admission status:  Inpatient  Severity of Illness: The appropriate patient status for this  patient is INPATIENT. Inpatient status is judged to be reasonable and necessary in order to provide the required intensity of service to ensure the patient's safety. The patient's presenting symptoms, physical exam findings, and initial radiographic and laboratory data in the context of their chronic comorbidities is felt to place them at high risk for further clinical deterioration. Furthermore, it is not anticipated that the patient will be medically stable for discharge from the hospital within 2 midnights of admission. The following factors support the patient status of inpatient.   " The patient's presenting symptoms include confusion and slurred speech. " The worrisome physical exam findings include confusion with flapping tremor. " The initial radiographic and laboratory data are worrisome because of elevated ammonia level. " The chronic co-morbidities include NASH cirrhosis.   * I certify that at the point of admission it is my clinical judgment that the patient will require inpatient hospital care spanning beyond 2 midnights from the point of admission due to high intensity of service, high risk for further deterioration and high frequency of surveillance required.Barbette Merino MD [facility] Pager [phone]  If 7PM-7AM, please contact night-coverage [email or web address] Password [email or web address]  01/08/2021, 7:20 PM

## 2021-01-08 NOTE — ED Provider Notes (Signed)
[facility] DEPT Provider Note   CSN: [id number] Arrival date & time: 01/08/21  1604     History No chief complaint on file.   Meagan Peck is a 60 y.o. female.  60 year old female presents with slurred speech times several days.  States that she has felt weak for about a week.  Has a history of cirrhosis and has had hepatic encephalopathy before in the past.  Denies any falls.  No urinary symptoms.  No cough or congestion.  No fever.  Some mild abdominal cramping which she gets at times.  Mild headache appreciated without photophobia or neck discomfort.  Patient states has had trouble ambulating recently.  Has been compliant with her medications      Past Medical History:  Diagnosis Date   Acute medial meniscus tear of right knee 12/10/2015   Allergy    Anasarca    Arthritis    bil feet   Ascites    Cataract    bilateral - surgery to remove   Cirrhosis ([place])    Diabetes mellitus without complication ([place])    type 2 - last a1c was 5.2 per patient, no meds   Dyspnea    when I have too much fluid   Esophageal varices (HCC)    GERD (gastroesophageal reflux disease)    Heart murmur    Hepatic cirrhosis (HCC)    HLD (hyperlipidemia)    no meds   Hypertension    no meds   Iron deficiency anemia 09/26/2018    Patient Active Problem List   Diagnosis Date Noted   Liver cirrhosis secondary to NASH ([place]) 01/27/2020   Abnormal weight gain 01/27/2020   Anasarca 01/27/2020   Electrolyte disturbance 01/27/2020   Hospital discharge follow-up 01/27/2020   NASH (nonalcoholic steatohepatitis)    Portal hypertension ([place])    Angiodysplasia of upper gastrointestinal tract    SBP (spontaneous bacterial peritonitis) ([place]) 01/08/2020   Esophageal varices without bleeding ([place])    Hypertension associated with diabetes ([place]) 08/16/2019   Chest pain 11/27/2017   Vitamin D deficiency 05/15/2017   Iron deficiency 05/15/2017   Breast screening declined  05/15/2017   Colonoscopy refused 05/15/2017   Choroidal nevus of both eyes 07/18/2016   Corneal epithelial and basement membrane dystrophy 07/18/2016   Thrombocytopenia ([place]) 04/14/2016   Hyperlipidemia associated with type 2 diabetes mellitus ([place]) 05/25/2015   Gastroesophageal reflux disease 05/25/2015   Type 2 diabetes mellitus with other specified complication ([place]) 10/30/1599    Past Surgical History:  Procedure Laterality Date   BIOPSY  08/20/2019   Procedure: BIOPSY;  Surgeon: Jackquline Denmark, MD;  Location: [facility];  Service: Gastroenterology;;   CARPAL TUNNEL RELEASE Right    CESAREAN SECTION     CHOLECYSTECTOMY     COLONOSCOPY  08/2019   ESOPHAGEAL BANDING N/A 09/19/2019   Procedure: ESOPHAGEAL BANDING;  Surgeon: Yetta Flock, MD;  [facility];  Service: Gastroenterology;  Laterality: N/A;   ESOPHAGOGASTRODUODENOSCOPY (EGD) WITH PROPOFOL N/A 08/20/2019   Procedure: ESOPHAGOGASTRODUODENOSCOPY (EGD) WITH PROPOFOL;  Surgeon: Jackquline Denmark, MD;  Location: [facility];  Service: Gastroenterology;  Laterality: N/A;   ESOPHAGOGASTRODUODENOSCOPY (EGD) WITH PROPOFOL N/A 09/19/2019   Procedure: ESOPHAGOGASTRODUODENOSCOPY (EGD) WITH PROPOFOL;  Surgeon: Yetta Flock, MD;  [facility];  Service: Gastroenterology;  Laterality: N/A;   ESOPHAGOGASTRODUODENOSCOPY (EGD) WITH PROPOFOL N/A 01/16/2020   Procedure: ESOPHAGOGASTRODUODENOSCOPY (EGD) WITH PROPOFOL;  Surgeon: Jerene Bears, MD;  Location: [facility];  Service: Endoscopy;  Laterality: N/A;  EYE SURGERY Bilateral    removed cataracts   GASTRIC VARICES BANDING N/A 08/20/2019   Procedure: GASTRIC VARICES BANDING;  Surgeon: Jackquline Denmark, MD;  Location: [facility];  Service: Gastroenterology;  Laterality: N/A;   HOT HEMOSTASIS N/A 01/16/2020   Procedure: HOT HEMOSTASIS (ARGON PLASMA COAGULATION/BICAP);  Surgeon: Jerene Bears, MD;  Location: [facility];  Service: Endoscopy;  Laterality: N/A;   IR  PARACENTESIS  08/01/2019   IR PARACENTESIS  08/19/2019   IR PARACENTESIS  09/09/2019   IR PARACENTESIS  09/23/2019   IR PARACENTESIS  10/08/2019   IR PARACENTESIS  10/23/2019   IR PARACENTESIS  11/12/2019   IR PARACENTESIS  11/21/2019   IR PARACENTESIS  11/25/2019   IR PARACENTESIS  12/05/2019   IR PARACENTESIS  12/13/2019   IR PARACENTESIS  12/20/2019   IR PARACENTESIS  12/26/2019   IR PARACENTESIS  12/31/2019   IR PARACENTESIS  01/07/2020   IR PARACENTESIS  01/10/2020   IR PARACENTESIS  01/13/2020   IR PARACENTESIS  01/14/2020   IR PARACENTESIS  01/20/2020   IR PARACENTESIS  01/30/2020   IR PARACENTESIS  02/24/2020   IR RADIOLOGIST EVAL & MGMT  10/30/2019   IR RADIOLOGIST EVAL & MGMT  12/11/2019   IR RADIOLOGIST EVAL & MGMT  12/24/2019   IR RADIOLOGIST EVAL & MGMT  03/05/2020   IR RADIOLOGIST EVAL & MGMT  05/15/2020   IR TIPS  11/25/2019   IR TIPS  01/14/2020   KNEE ARTHROSCOPY Right 12/10/2015   Procedure: RIGHT KNEE ARTHROSCOPY WITH PARTIAL MEDIAL MENISCECTOMY;  Surgeon: Mcarthur Rossetti, MD;  [facility]: [facility];  Service: Orthopedics;  Laterality: Right;   KNEE ARTHROSCOPY W/ MENISCAL REPAIR Bilateral    MENISCUS REPAIR Left    RADIOLOGY WITH ANESTHESIA N/A 11/25/2019   Procedure: TIPS;  Surgeon: Corrie Mckusick, DO;  Location: [facility];  Service: Anesthesiology;  Laterality: N/A;   RADIOLOGY WITH ANESTHESIA N/A 01/14/2020   Procedure: TIPS;  Surgeon: Corrie Mckusick, DO;  Location: [facility];  Service: Anesthesiology;  Laterality: N/A;     OB History   No obstetric history on file.     Family History  Problem Relation Age of Onset   Hypertension Mother    COPD Mother    Alcoholism Father    Heart attack Father    Diabetes Brother    Diabetes Paternal Grandmother    Heart disease Paternal Grandmother    Alcoholism Paternal Uncle    Alcoholism Maternal Uncle    Heart disease Maternal Grandmother    Heart disease Maternal Grandfather    Heart disease Paternal Grandfather     Social History    Tobacco Use   Smoking status: Never   Smokeless tobacco: Never  Vaping Use   Vaping Use: Never used  Substance Use Topics   Alcohol use: No   Drug use: No    Home Medications Prior to Admission medications   Medication Sig Start Date End Date Taking? Authorizing Provider  ACCU-CHEK FASTCLIX LANCETS MISC Check fsbs TID 08/09/16   [provider]  acetaminophen (TYLENOL) 500 MG tablet Take 500-1,000 mg by mouth every 6 (six) hours as needed for moderate pain or headache.    [provider]  AMBULATORY NON FORMULARY MEDICATION Wedge pillow 01/27/20   Early, Coralee Pesa, NP  cetirizine (ZYRTEC) 10 MG tablet Take 1 tablet (10 mg total) by mouth daily. Patient taking differently: Take 10 mg by mouth daily as needed for allergies. 01/02/19   [name], DO  Cholecalciferol (VITAMIN D3) 5000 UNITS TABS Take 5,000 Units by mouth daily.     [provider]  ferrous sulfate 325 (65 FE) MG EC tablet Take 1 tablet (325 mg total) by mouth 2 (two) times daily with a meal. 09/26/18   [name], DO  insulin degludec (TRESIBA) 200 UNIT/ML FlexTouch Pen INJECT 10 - 50 UNITS INTO THE SKIN DAILY. TARGET TO FASTING GLUCOSE 150. Patient taking differently: Inject 10-50 Units into the skin daily. 06/29/20 06/29/21  [name], DO  insulin lispro (HUMALOG) 200 UNIT/ML KwikPen INJECT 10 - 15 UNITS INTO THE SKIN 3 TIMES DAILY BEFORE MEALS Patient taking differently: Inject 10-15 Units into the skin with breakfast, with lunch, and with evening meal. 07/09/20 07/09/21  [name], DO  Insulin Pen Needle 32G X 4 MM MISC USE TO INJECT TRESIBA 06/29/20 06/29/21  [name], DO  Krill Oil 500 MG CAPS Take 1,000 mg by mouth 2 (two) times daily.    [provider]  lactulose, encephalopathy, (CHRONULAC) 10 GM/15ML SOLN Take 30 mLs (20 g total) by mouth 2 (two) times daily. 09/19/20   Hosie Poisson, MD  magnesium oxide (MAG-OX) 400 (240 Mg) MG tablet TAKE 1  TABLET BY MOUTH TWICE DAILY Patient taking differently: Take 400 mg by mouth 2 (two) times daily. 09/03/20 09/03/21  [name], DO  Multiple Vitamin (MULTI-DAY PO) Take 1 tablet by mouth daily.    [provider]  pantoprazole (PROTONIX) 40 MG tablet TAKE 1 TABLET (40 MG TOTAL) BY MOUTH DAILY. Patient taking differently: Take 40 mg by mouth daily. 02/17/20 02/16/21  Yetta Flock, MD    Allergies    Penicillins, Atrovent [ipratropium], Naproxen, Quinine derivatives, and Sulfa antibiotics  Review of Systems   Review of Systems  All other systems reviewed and are negative.  Physical Exam Updated Vital Signs BP (!) 151/68   Pulse 67   Temp 98.7 F (37.1 C) (Oral)   Resp 20   Ht 1.473 m (4' 10" )   Wt 59 kg   LMP 12/03/2013 (LMP Unknown)   SpO2 100%   BMI 27.18 kg/m   Physical Exam Vitals and nursing note reviewed.  Constitutional:      General: She is not in acute distress.    Appearance: Normal appearance. She is well-developed. She is not toxic-appearing.  HENT:     Head: Normocephalic and atraumatic.  Eyes:     General: Lids are normal.     Conjunctiva/sclera: Conjunctivae normal.     Pupils: Pupils are equal, round, and reactive to light.  Neck:     Thyroid: No thyroid mass.     Trachea: No tracheal deviation.  Cardiovascular:     Rate and Rhythm: Normal rate and regular rhythm.     Heart sounds: Normal heart sounds. No murmur heard.   No gallop.  Pulmonary:     Effort: Pulmonary effort is normal. No respiratory distress.     Breath sounds: Normal breath sounds. No stridor. No decreased breath sounds, wheezing, rhonchi or rales.  Abdominal:     General: There is no distension.     Palpations: Abdomen is soft.     Tenderness: There is no abdominal tenderness. There is no rebound.  Musculoskeletal:        General: No tenderness. Normal range of motion.     Cervical back: Normal range of motion and neck supple.  Skin:    General: Skin is warm  and dry.     Findings: No  abrasion or rash.  Neurological:     Mental Status: She is alert and oriented to person, place, and time.     GCS: GCS eye subscore is 4. GCS verbal subscore is 5. GCS motor subscore is 6.     Cranial Nerves: Cranial nerves are intact. No cranial nerve deficit.     Sensory: No sensory deficit.     Motor: Motor function is intact. No weakness.     Coordination: Finger-Nose-Finger Test normal.     Comments: Slurred speech appreciated.  Psychiatric:        Attention and Perception: Attention normal.        Speech: Speech normal.        Behavior: Behavior normal.    ED Results / Procedures / Treatments   Labs (all labs ordered are listed, but only abnormal results are displayed) Labs Reviewed  CBC WITH DIFFERENTIAL/PLATELET - Abnormal; Notable for the following components:      Result Value   MCHC 36.2 (*)    Platelets 110 (*)    All other components within normal limits  COMPREHENSIVE METABOLIC PANEL - Abnormal; Notable for the following components:   Glucose, Bld 348 (*)    Total Protein 6.0 (*)    Albumin 3.2 (*)    AST 54 (*)    Total Bilirubin 5.2 (*)    All other components within normal limits  AMMONIA - Abnormal; Notable for the following components:   Ammonia 83 (*)    All other components within normal limits  RESP PANEL BY RT-PCR (FLU A&B, COVID) [id number]  URINALYSIS, ROUTINE W REFLEX MICROSCOPIC    EKG EKG Interpretation  Date/Time:  Friday January 08 2021 16:17:36 EDT Ventricular Rate:  65 PR Interval:  149 QRS Duration: 88 QT Interval:  426 QTC Calculation: 443 R Axis:   70 Text Interpretation: Sinus rhythm Confirmed by Lacretia Leigh ([id number]) on 01/08/2021 5:33:39 PM  Radiology No results found.  Procedures Procedures   Medications Ordered in ED Medications - No data to display  ED Course  I have reviewed the triage vital signs and the nursing notes.  Pertinent labs & imaging results that were available during my care of  the patient were reviewed by me and considered in my medical decision making (see chart for details).    MDM Rules/Calculators/A&P                           Head CT without acute findings here.  Patient has had elevated ammonia here.  Start lactulose.  Mild hyperglycemia noted.  Will require admission Final Clinical Impression(s) / ED Diagnoses Final diagnoses:  None    Rx / DC Orders ED Discharge Orders     None        Lacretia Leigh, MD 01/08/21 1806

## 2021-01-08 NOTE — ED Provider Notes (Addendum)
Emergency Medicine Provider Triage Evaluation Note  Meagan Peck , a 60 y.o. female  was evaluated in triage.  Pt complains of slurred speech that began 1 week ago. She also complains of generalized weakness. She had more difficulty getting up out of bed this morning and came to the ED. She is on lactulose for cirrhosis and has not missed any doses. She does mention that she has been peeing more than usual lately as well. No other complaints.  Review of Systems  Positive: + slurred speech, generalized weakness, frequency Negative: - abdominal pain, nausea,vomiting  Physical Exam  BP (!) 140/56 (BP Location: Right Arm)   Pulse 68   Temp 98.7 F (37.1 C) (Oral)   Resp 18   Ht 4' 10"  (1.473 m)   Wt 59 kg   LMP 12/03/2013 (LMP Unknown)   SpO2 100%   BMI 27.18 kg/m  Gen:   Awake, no distress   Resp:  Normal effort  MSK:   Moves extremities without difficulty  Other:  Speech is slurred. Some asterixis with Finger to Nose. Remainder of neuro intact.   Medical Decision Making  Medically screening exam initiated at 4:27 PM.  Appropriate orders placed.  Oveta P Delgrande was informed that the remainder of the evaluation will be completed by another provider, this initial triage assessment does not replace that evaluation, and the importance of remaining in the ED until their evaluation is complete.  Question hepatic encephalopathy vs stroke vs UTI. Not in the window for tPA given symptoms ongoing for 1 week.      Eustaquio Maize, PA-C 01/08/21 1628    Lacretia Leigh, MD 01/08/21 [phone]

## 2021-01-09 ENCOUNTER — Inpatient Hospital Stay (HOSPITAL_COMMUNITY): Payer: Self-pay

## 2021-01-09 LAB — COMPREHENSIVE METABOLIC PANEL
ALT: 30 U/L (ref 0–44)
AST: 46 U/L — ABNORMAL HIGH (ref 15–41)
Albumin: 2.7 g/dL — ABNORMAL LOW (ref 3.5–5.0)
Alkaline Phosphatase: 103 U/L (ref 38–126)
Anion gap: 11 (ref 5–15)
BUN: 14 mg/dL (ref 6–20)
CO2: 23 mmol/L (ref 22–32)
Calcium: 9.4 mg/dL (ref 8.9–10.3)
Chloride: 103 mmol/L (ref 98–111)
Creatinine, Ser: 0.81 mg/dL (ref 0.44–1.00)
GFR, Estimated: >60 mL/min (ref >60)
Glucose, Bld: 316 mg/dL — ABNORMAL HIGH (ref 70–99)
Potassium: 3.5 mmol/L (ref 3.5–5.1)
Sodium: 137 mmol/L (ref 135–145)
Total Bilirubin: 5 mg/dL — ABNORMAL HIGH (ref 0.3–1.2)
Total Protein: 5 g/dL — ABNORMAL LOW (ref 6.5–8.1)

## 2021-01-09 LAB — CBC
HCT: 31.8 % — ABNORMAL LOW (ref 36.0–46.0)
Hemoglobin: 11.6 g/dL — ABNORMAL LOW (ref 12.0–15.0)
MCH: 33 pg (ref 26.0–34.0)
MCHC: 36.5 g/dL — ABNORMAL HIGH (ref 30.0–36.0)
MCV: 90.3 fL (ref 80.0–100.0)
Platelets: 97 10*3/uL — ABNORMAL LOW (ref 150–400)
RBC: 3.52 MIL/uL — ABNORMAL LOW (ref 3.87–5.11)
RDW: 14.6 % (ref 11.5–15.5)
WBC: 4.6 10*3/uL (ref 4.0–10.5)
nRBC: 0 % (ref 0.0–0.2)

## 2021-01-09 LAB — HEMOGLOBIN A1C
Hgb A1c MFr Bld: 10.7 % — ABNORMAL HIGH (ref 4.8–5.6)
Mean Plasma Glucose: 260.39 mg/dL

## 2021-01-09 LAB — GLUCOSE, CAPILLARY
Glucose-Capillary: 311 mg/dL — ABNORMAL HIGH (ref 70–99)
Glucose-Capillary: 414 mg/dL — ABNORMAL HIGH (ref 70–99)
Glucose-Capillary: 437 mg/dL — ABNORMAL HIGH (ref 70–99)
Glucose-Capillary: 355 mg/dL — ABNORMAL HIGH (ref 70–99)

## 2021-01-09 MED ORDER — INSULIN GLARGINE-YFGN 100 UNIT/ML ~~LOC~~ SOLN
20.0000 [IU] | Freq: Two times a day (BID) | SUBCUTANEOUS | Status: DC
  Administered 2021-01-09 – 2021-01-12 (×6): 20 [IU] via SUBCUTANEOUS
  Filled 2021-01-09 (×6): qty 0.2

## 2021-01-09 MED ORDER — INSULIN ASPART 100 UNIT/ML IJ SOLN
8.0000 [IU] | Freq: Three times a day (TID) | INTRAMUSCULAR | Status: DC
  Administered 2021-01-10 (×2): 8 [IU] via SUBCUTANEOUS

## 2021-01-09 NOTE — Evaluation (Signed)
Clinical/Bedside Swallow Evaluation Patient Details  Name: Meagan Peck MRN: [id number] Date of Birth: [birth year]  Today's Date: 01/09/2021 Time: SLP Start Time (ACUTE ONLY): 36 SLP Stop Time (ACUTE ONLY): 24 SLP Time Calculation (min) (ACUTE ONLY): 20 min  Past Medical History:  Past Medical History:  Diagnosis Date   Acute medial meniscus tear of right knee 12/10/2015   Allergy    Anasarca    Arthritis    bil feet   Ascites    Cataract    bilateral - surgery to remove   Cirrhosis ([place])    Diabetes mellitus without complication ([place])    type 2 - last a1c was 5.2 per patient, no meds   Dyspnea    when I have too much fluid   Esophageal varices (H[facility])    GERD (gastroesophageal reflux disease)    Heart murmur    Hepatic cirrhosis ([facility])    HLD (hyperlipidemia)    no meds   Hypertension    no meds   Iron deficiency anemia 09/26/2018   Past Surgical History:  Past Surgical History:  Procedure Laterality Date   BIOPSY  08/20/2019   Procedure: BIOPSY;  Surgeon: Jackquline Denmark, MD;  Location: [facility];  Service: Gastroenterology;;   CARPAL TUNNEL RELEASE Right    CESAREAN SECTION     CHOLECYSTECTOMY     COLONOSCOPY  08/2019   ESOPHAGEAL BANDING N/A 09/19/2019   Procedure: ESOPHAGEAL BANDING;  Surgeon: Yetta Flock, MD;  [facility];  Service: Gastroenterology;  Laterality: N/A;   ESOPHAGOGASTRODUODENOSCOPY (EGD) WITH PROPOFOL N/A 08/20/2019   Procedure: ESOPHAGOGASTRODUODENOSCOPY (EGD) WITH PROPOFOL;  Surgeon: Jackquline Denmark, MD;  Location: [facility];  Service: Gastroenterology;  Laterality: N/A;   ESOPHAGOGASTRODUODENOSCOPY (EGD) WITH PROPOFOL N/A 09/19/2019   Procedure: ESOPHAGOGASTRODUODENOSCOPY (EGD) WITH PROPOFOL;  Surgeon: Yetta Flock, MD;  [facility];  Service: Gastroenterology;  Laterality: N/A;   ESOPHAGOGASTRODUODENOSCOPY (EGD) WITH PROPOFOL N/A 01/16/2020   Procedure: ESOPHAGOGASTRODUODENOSCOPY (EGD) WITH PROPOFOL;   Surgeon: Jerene Bears, MD;  Location: [facility];  Service: Endoscopy;  Laterality: N/A;   EYE SURGERY Bilateral    removed cataracts   GASTRIC VARICES BANDING N/A 08/20/2019   Procedure: GASTRIC VARICES BANDING;  Surgeon: Jackquline Denmark, MD;  Location: [facility];  Service: Gastroenterology;  Laterality: N/A;   HOT HEMOSTASIS N/A 01/16/2020   Procedure: HOT HEMOSTASIS (ARGON PLASMA COAGULATION/BICAP);  Surgeon: Jerene Bears, MD;  Location: [facility];  Service: Endoscopy;  Laterality: N/A;   IR PARACENTESIS  08/01/2019   IR PARACENTESIS  08/19/2019   IR PARACENTESIS  09/09/2019   IR PARACENTESIS  09/23/2019   IR PARACENTESIS  10/08/2019   IR PARACENTESIS  10/23/2019   IR PARACENTESIS  11/12/2019   IR PARACENTESIS  11/21/2019   IR PARACENTESIS  11/25/2019   IR PARACENTESIS  12/05/2019   IR PARACENTESIS  12/13/2019   IR PARACENTESIS  12/20/2019   IR PARACENTESIS  12/26/2019   IR PARACENTESIS  12/31/2019   IR PARACENTESIS  01/07/2020   IR PARACENTESIS  01/10/2020   IR PARACENTESIS  01/13/2020   IR PARACENTESIS  01/14/2020   IR PARACENTESIS  01/20/2020   IR PARACENTESIS  01/30/2020   IR PARACENTESIS  02/24/2020   IR RADIOLOGIST EVAL & MGMT  10/30/2019   IR RADIOLOGIST EVAL & MGMT  12/11/2019   IR RADIOLOGIST EVAL & MGMT  12/24/2019   IR RADIOLOGIST EVAL & MGMT  03/05/2020   IR RADIOLOGIST EVAL & MGMT  05/15/2020   IR TIPS  11/25/2019  IR TIPS  01/14/2020   KNEE ARTHROSCOPY Right 12/10/2015   Procedure: RIGHT KNEE ARTHROSCOPY WITH PARTIAL MEDIAL MENISCECTOMY;  Surgeon: Mcarthur Rossetti, MD;  [facility];  Service: Orthopedics;  Laterality: Right;   KNEE ARTHROSCOPY W/ MENISCAL REPAIR Bilateral    MENISCUS REPAIR Left    RADIOLOGY WITH ANESTHESIA N/A 11/25/2019   Procedure: TIPS;  Surgeon: Corrie Mckusick, DO;  Location: [facility];  Service: Anesthesiology;  Laterality: N/A;   RADIOLOGY WITH ANESTHESIA N/A 01/14/2020   Procedure: TIPS;  Surgeon: Corrie Mckusick, DO;  Location: [facility];  Service:  Anesthesiology;  Laterality: N/A;   HPI:  60 y.o. female with medical history significant of [name] cirrhosis with previous hepatic encephalopathy, diabetes, essential hypertension, hyperlipidemia, esophageal varices, GERD who was brought in by family secondary to confusion weakness for about a week.  Also having slurred speech.  Patient has had previous hepatic encephalopathy and suspected to have another 1.  Denied any fall or head injury.  No fever or chill.  Patient has been compliant with her medications including lactulose.  Her ammonia today is 83.  She has had similar episode before and turned out to be due to hepatic encephalopathy.  She is currently being admitted with suspicion of another episode of hepatic encephalopathy; BSE generated to r/o dysphagia.   Assessment / Plan / Recommendation Clinical Impression  Pt seen for a clinical swallowing evaluation with a normal oropharyngeal swallow noted despite mild-moderate dysarthria present in patient's speech during informal conversation.  Intelligibility judged to be 50-75% accurate depending on utterance length/complexity of words with improvement made with slow precise articulation and increased breath support provided.  Pt denied dysphagia and swallow appeared timely and without s/s of aspiration noted throughout BSE.  Continue current diet of Regular (carb modifed)/thin liquids with general swallowing precautions in place.  May consider speech/language evaluation prior to d/c to address dysarthria and decreased processing/response time reported by pt and observed within session OR have ST f/u at d/c at next venue.  Thank you for this consult. SLP Visit Diagnosis: Dysphagia, unspecified (R13.10)    Aspiration Risk  Mild aspiration risk    Diet Recommendation   Regular/thin liquids  Medication Administration: Whole meds with liquid    Other  Recommendations Oral Care Recommendations: Oral care BID   Follow up Recommendations Other  (comment) (TBD)      Frequency and Duration            Prognosis Prognosis for Safe Diet Advancement: Good      Swallow Study   General Date of Onset: 01/08/21 HPI: 60 y.o. female with medical history significant of [name] cirrhosis with previous hepatic encephalopathy, diabetes, essential hypertension, hyperlipidemia, esophageal varices, GERD who was brought in by family secondary to confusion weakness for about a week.  Also having slurred speech.  Patient has had previous hepatic encephalopathy and suspected to have another 1.  Denied any fall or head injury.  No fever or chill.  Patient has been compliant with her medications including lactulose.  Her ammonia today is 83.  She has had similar episode before and turned out to be due to hepatic encephalopathy.  She is currently being admitted with suspicion of another episode of hepatic encephalopathy; BSE generated to r/o dysphagia. Type of Study: Bedside Swallow Evaluation Previous Swallow Assessment: n/a Diet Prior to this Study: Regular;Thin liquids (carb modified) Temperature Spikes Noted: No Respiratory Status: Room air History of Recent Intubation: No Behavior/Cognition: Alert;Cooperative Oral Cavity Assessment: Within Functional Limits Oral  Care Completed by SLP: No Oral Cavity - Dentition: Adequate natural dentition Vision: Functional for self-feeding Self-Feeding Abilities: Able to feed self Patient Positioning: Upright in bed Baseline Vocal Quality: Low vocal intensity Volitional Cough: Strong Volitional Swallow: Able to elicit    Oral/Motor/Sensory Function Overall Oral Motor/Sensory Function: Within functional limits   Ice Chips Ice chips: Not tested   Thin Liquid Thin Liquid: Within functional limits Presentation: Straw    Nectar Thick Nectar Thick Liquid: Not tested   Honey Thick Honey Thick Liquid: Not tested   Puree Puree: Within functional limits Presentation: Self Fed   Solid     Solid: Within  functional limits Presentation: Self Fed      Elvina Sidle, M.S., CCC-SLP 01/09/2021,5:34 PM

## 2021-01-09 NOTE — Plan of Care (Signed)

## 2021-01-09 NOTE — Progress Notes (Signed)
PROGRESS NOTE    Meagan Peck  NWG:[id number] DOB: 09-18-1960 DOA: 01/08/2021 PCP: Pcp, No    No chief complaint on file.   Brief Narrative:  Meagan Peck is a 60 y.o. female with medical history significant of Meagan Peck cirrhosis with previous hepatic encephalopathy, esophageal varices, insulin dependent diabetes, essential hypertension, hyperlipidemia, , GERD who was brought in by family secondary to confusion weakness for about a week.  Also having slurred speech  Subjective:  Speech is slurred but aaox3, reports being weak No fever, denies ab pain  There is no edema Blood glucose elevated   Assessment & Plan:   Principal Problem:   Hepatic encephalopathy (HCC) Active Problems:   Hyperlipidemia associated with type 2 diabetes mellitus (HCC)   Gastroesophageal reflux disease   Hypertension associated with diabetes ([place])   Esophageal varices without bleeding (HCC)   Liver cirrhosis secondary to NASH (HCC)  Hepatic encephalopathy with Dysarthria Ua unremarkable Ammonia elevated, on lactulose, adjust dose for goal of bm 3-4times per 24hrs  Ct head negative, speech continue to be slurred, will get mri brain  NASH cirrhosis with history of esophageal varices, thrombocytopenia Appear to be on Cipro chronically which is continued She does not appear to have significant ascites, she denies abdominal pain Will get ab [place]  Insulin-dependent type 2 diabetes, uncontrolled, with hyperglycemia A1c 10.7 Will increase long acting  insulin to twice daily, add meal coverage, continue SSI  Generalized weakness we will get PT   Body mass index is 27.18 kg/m.Marland Kitchen       Unresulted Labs (From admission, onward)    None         DVT prophylaxis: SCDs Start: 01/08/21 2128   Code Status: Full Family Communication: None at bedside Disposition:   Status is: Inpatient  Dispo: The patient is from: Home              Anticipated d/c is to: TBD              Anticipated d/c date  is: Likely early next week if confusion improves, blood glucose better controlled               Consultants:  None  Procedures:  None  Antimicrobials:    Anti-infectives (From admission, onward)    Start     Dose/Rate Route Frequency Ordered Stop   01/09/21 0800  ciprofloxacin (CIPRO) tablet 500 mg        500 mg Oral Daily with breakfast 01/08/21 2127            Objective: Vitals:   01/08/21 2157 01/09/21 0154 01/09/21 0555 01/09/21 0950  BP: (!) 139/58 134/63 129/62 130/60  Pulse: (!) 59 60 62 60  Resp: 20 (!) 22 20 19   Temp: 98.1 F (36.7 C) 98 F (36.7 C) 98.4 F (36.9 C) 97.8 F (36.6 C)  TempSrc: Oral Oral Oral Oral  SpO2: 100% 100% 100% 100%  Weight:      Height:       No intake or output data in the 24 hours ending 01/09/21 1255 Filed Weights   01/08/21 1619  Weight: 59 kg    Examination:  General exam: appear weak Respiratory system: Clear to auscultation. Respiratory effort normal. Cardiovascular system: S1 & S2 heard, RRR. No JVD, no murmur, No pedal edema. Gastrointestinal system: abdominal hernia, reducible, no ab pain, does not appear to have significant ascites, + bs. Central nervous system: Alert and oriented. Speech is slow and slurred Extremities: no edema  Skin: No rashes, lesions or ulcers Psychiatry: calm and cooperative .     Data Reviewed: I have personally reviewed following labs and imaging studies  CBC: Recent Labs  Lab 01/08/21 1634 01/09/21 0548  WBC 5.3 4.6  NEUTROABS 3.3  --   HGB 13.2 11.6*  HCT 36.5 31.8*  MCV 91.0 90.3  PLT 110* 97*    Basic Metabolic Panel: Recent Labs  Lab 01/08/21 1634 01/09/21 0548  NA 139 137  K 4.5 3.5  CL 105 103  CO2 23 23  GLUCOSE 348* 316*  BUN 15 14  CREATININE 0.94 0.81  CALCIUM 10.2 9.4    GFR: Estimated Creatinine Clearance: 57.5 mL/min (by C-G formula based on SCr of 0.81 mg/dL).  Liver Function Tests: Recent Labs  Lab 01/08/21 1634 01/09/21 0548  AST 54* 46*   ALT 34 30  ALKPHOS 126 103  BILITOT 5.2* 5.0*  PROT 6.0* 5.0*  ALBUMIN 3.2* 2.7*    CBG: Recent Labs  Lab 01/08/21 2235 01/09/21 0803  GLUCAP 285* 311*     Recent Results (from the past 240 hour(s))  Resp Panel by RT-PCR (Flu A&B, Covid) Nasopharyngeal Swab     Status: None   Collection Time: 01/08/21  8:50 PM   Specimen: Nasopharyngeal Swab; Nasopharyngeal(NP) swabs in vial transport medium  Result Value Ref Range Status   SARS Coronavirus 2 by RT PCR NEGATIVE NEGATIVE Final    Comment: (NOTE) SARS-CoV-2 target nucleic acids are NOT DETECTED.  The SARS-CoV-2 RNA is generally detectable in upper respiratory specimens during the acute phase of infection. The lowest concentration of SARS-CoV-2 viral copies this assay can detect is 138 copies/mL. A negative result does not preclude SARS-Cov-2 infection and should not be used as the sole basis for treatment or other patient management decisions. A negative result may occur with  improper specimen collection/handling, submission of specimen other than nasopharyngeal swab, presence of viral mutation(s) within the areas targeted by this assay, and inadequate number of viral copies(<138 copies/mL). A negative result must be combined with clinical observations, patient history, and epidemiological information. The expected result is Negative.  Fact Sheet for Patients:  EntrepreneurPulse.com.au  Fact Sheet for Healthcare Providers:  IncredibleEmployment.be  This test is no t yet approved or cleared by the [place] FDA and  has been authorized for detection and/or diagnosis of SARS-CoV-2 by FDA under an Emergency Use Authorization (EUA). This EUA will remain  in effect (meaning this test can be used) for the duration of the COVID-19 declaration under Section 564(b)(1) of the Act, 21 U.S.C.section 360bbb-3(b)(1), unless the authorization is terminated  or revoked sooner.        Influenza A by PCR NEGATIVE NEGATIVE Final   Influenza B by PCR NEGATIVE NEGATIVE Final    Comment: (NOTE) The Xpert Xpress SARS-CoV-2/FLU/RSV plus assay is intended as an aid in the diagnosis of influenza from Nasopharyngeal swab specimens and should not be used as a sole basis for treatment. Nasal washings and aspirates are unacceptable for Xpert Xpress SARS-CoV-2/FLU/RSV testing.  Fact Sheet for Patients: EntrepreneurPulse.com.au  Fact Sheet for Healthcare Providers: IncredibleEmployment.be  This test is not yet approved or cleared by the [place] FDA and has been authorized for detection and/or diagnosis of SARS-CoV-2 by FDA under an Emergency Use Authorization (EUA). This EUA will remain in effect (meaning this test can be used) for the duration of the COVID-19 declaration under Section 564(b)(1) of the Act, 21 U.S.C. section 360bbb-3(b)(1), unless the authorization is terminated or  revoked.  Performed at [facility], [place] [address] [place], [place] [address]          Radiology Studies: CT Head Wo Contrast  Result Date: 01/08/2021 CLINICAL DATA:  Slurred speech, unsteady gait, weakness EXAM: CT HEAD WITHOUT CONTRAST TECHNIQUE: Contiguous axial images were obtained from the base of the skull through the vertex without intravenous contrast. COMPARISON:  None. FINDINGS: Brain: No acute infarct or hemorrhage. Lateral ventricles and midline structures are unremarkable. No acute extra-axial fluid collections. No mass effect. Vascular: No hyperdense vessel. Mild diffuse atherosclerosis of the internal carotid arteries. Skull: Normal. Negative for fracture or focal lesion. Sinuses/Orbits: No acute finding. Other: None. IMPRESSION: 1. No acute intracranial process. Electronically Signed   By: Randa Ngo M.D.   On: 01/08/2021 17:57        Scheduled Meds:  cholecalciferol  1,000 Units Oral QHS   ciprofloxacin  500 mg  Oral Q breakfast   ferrous sulfate  325 mg Oral BID WC   insulin aspart  0-5 Units Subcutaneous QHS   insulin aspart  0-9 Units Subcutaneous TID WC   insulin glargine-yfgn  20 Units Subcutaneous Daily   lactulose  30 g Oral BID   loratadine  10 mg Oral Daily   multivitamin with minerals  1 tablet Oral Daily   pantoprazole  40 mg Oral QAC breakfast   Continuous Infusions:  dextrose 5 % and 0.45% NaCl 50 mL/hr at 01/08/21 2352     LOS: 1 day   Time spent: 89mns Greater than 50% of this time was spent in counseling, explanation of diagnosis, planning of further management, and coordination of care.   Voice Recognition /Viviann Sparedictation system was used to create this note, attempts have been made to correct errors. Please contact the author with questions and/or clarifications.   FFlorencia Reasons MD PhD FACP [facility]  Available via Epic secure chat 7am-7pm for nonurgent issues Please page for urgent issues To page the attending provider between 7A-7P or the covering provider during after hours 7P-7A, please log into the web site [email or web address] and access using universal [place] password for that web site. If you do not have the password, please call the hospital operator.    01/09/2021, 12:55 PM

## 2021-01-09 NOTE — Progress Notes (Signed)
Pt is injury free, afebrile, alert, and oriented X 4. Vital signs were within the baseline during this shift. She complained of general weakness. Her speech remains slurred but understandable. Pt denies chest pain, SOB, nausea, vomiting, dizziness, signs or symptoms of bleeding or infection or acute changes during this shift. We will continue to monitor and work toward achieving the care plan goals.

## 2021-01-10 ENCOUNTER — Inpatient Hospital Stay (HOSPITAL_COMMUNITY): Payer: Self-pay

## 2021-01-10 DIAGNOSIS — G372 Central pontine myelinolysis: Secondary | ICD-10-CM

## 2021-01-10 LAB — BASIC METABOLIC PANEL
Anion gap: 9 (ref 5–15)
BUN: 13 mg/dL (ref 6–20)
CO2: 24 mmol/L (ref 22–32)
Calcium: 9.4 mg/dL (ref 8.9–10.3)
Chloride: 107 mmol/L (ref 98–111)
Creatinine, Ser: 0.97 mg/dL (ref 0.44–1.00)
GFR, Estimated: >60 mL/min (ref >60)
Glucose, Bld: 219 mg/dL — ABNORMAL HIGH (ref 70–99)
Potassium: 3.8 mmol/L (ref 3.5–5.1)
Sodium: 140 mmol/L (ref 135–145)

## 2021-01-10 LAB — AMMONIA: Ammonia: 139 umol/L — ABNORMAL HIGH (ref 9–35)

## 2021-01-10 LAB — GLUCOSE, CAPILLARY
Glucose-Capillary: 232 mg/dL — ABNORMAL HIGH (ref 70–99)
Glucose-Capillary: 435 mg/dL — ABNORMAL HIGH (ref 70–99)
Glucose-Capillary: 491 mg/dL — ABNORMAL HIGH (ref 70–99)
Glucose-Capillary: 420 mg/dL — ABNORMAL HIGH (ref 70–99)
Glucose-Capillary: 299 mg/dL — ABNORMAL HIGH (ref 70–99)

## 2021-01-10 LAB — MAGNESIUM: Magnesium: 1.5 mg/dL — ABNORMAL LOW (ref 1.7–2.4)

## 2021-01-10 MED ORDER — INSULIN ASPART 100 UNIT/ML IJ SOLN
12.0000 [IU] | Freq: Three times a day (TID) | INTRAMUSCULAR | Status: DC
  Administered 2021-01-10 – 2021-01-12 (×5): 12 [IU] via SUBCUTANEOUS

## 2021-01-10 MED ORDER — SODIUM CHLORIDE 0.9 % IV SOLN
INTRAVENOUS | Status: DC
Start: 2021-01-10 — End: 2021-01-11

## 2021-01-10 MED ORDER — MAGNESIUM SULFATE 2 GM/50ML IV SOLN
2.0000 g | Freq: Once | INTRAVENOUS | Status: AC
  Administered 2021-01-10: 2 g via INTRAVENOUS
  Filled 2021-01-10: qty 50

## 2021-01-10 MED ORDER — LACTULOSE 10 GM/15ML PO SOLN
30.0000 g | Freq: Three times a day (TID) | ORAL | Status: DC
  Administered 2021-01-10 – 2021-01-12 (×6): 30 g via ORAL
  Filled 2021-01-10 (×6): qty 45

## 2021-01-10 NOTE — Progress Notes (Signed)
PROGRESS NOTE    Meagan Peck  ENI:[id number] DOB: 12/20/1960 DOA: 01/08/2021 PCP: Pcp, No    No chief complaint on file.   Brief Narrative:  Meagan Peck is a 60 y.o. female with medical history significant of Meagan Peck cirrhosis with previous hepatic encephalopathy, esophageal varices, insulin dependent diabetes, essential hypertension, hyperlipidemia, , GERD who was brought in by family secondary to confusion weakness for about a week.  Also having slurred speech  Subjective:  She feel about the same, not worse, not better,  Speech is slurred but aaox3, reports being weak,. Reports feeling dizzy when standing up No fever,  There is no edema Blood glucose remain elevated despite increasing insulin dose  Assessment & Plan:   Principal Problem:   Hepatic encephalopathy (HCC) Active Problems:   Hyperlipidemia associated with type 2 diabetes mellitus (HCC)   Gastroesophageal reflux disease   Hypertension associated with diabetes (Meagan Peck)   Esophageal varices without bleeding (HCC)   Liver cirrhosis secondary to NASH (Meagan Peck)  Weakness/slurred speech MRI showed pontine demyelination Neurology consulted, will follow recommendation   Hepatic encephalopathy  Ua unremarkable Ammonia elevated,increase lactulose,  for goal of bm 3-4times per 24hrs   Reports feeling dizzy upon standing up Check orthostatic vital signs, start gentle hydration  Hypomagnesemia Replace mag  NASH cirrhosis with history of esophageal varices, thrombocytopenia Appear to be on Cipro chronically which is continued She does not appear to have significant ascites on exam, she denies abdominal pain ab [place] minimal ascites , This would be insufficient for paracentesis.  Insulin-dependent type 2 diabetes, uncontrolled, with hyperglycemia A1c 10.7 Will increase long acting  insulin to twice daily, increase meal coverage, continue SSI  Generalized weakness,  PT recommends CIR   Body mass index is 27.18  kg/m.Marland Kitchen       Unresulted Labs (From admission, onward)     Start     Ordered   01/10/21 6144  Basic metabolic panel  Daily,   R     Question:  Specimen collection method  Answer:  Lab=Lab collect   01/09/21 2137              DVT prophylaxis: SCDs Start: 01/08/21 2128   Code Status: Full Family Communication: None at bedside Disposition:   Status is: Inpatient  Dispo: The patient is from: Home              Anticipated d/c is to: CIR              Anticipated d/c date is: Sometime next week , if cleared by neurology  and  blood glucose better controlled               Consultants:  Neurology   Procedures:  None  Antimicrobials:    Anti-infectives (From admission, onward)    Start     Dose/Rate Route Frequency Ordered Stop   01/09/21 0800  ciprofloxacin (CIPRO) tablet 500 mg        500 mg Oral Daily with breakfast 01/08/21 2127            Objective: Vitals:   01/09/21 1341 01/09/21 2024 01/10/21 0446 01/10/21 1307  BP: 134/61 138/67 (!) 129/56 132/62  Pulse: 66 76 73 86  Resp: 17 20 20 17   Temp: 97.8 F (36.6 C) 98.1 F (36.7 C) 97.9 F (36.6 C) 98 F (36.7 C)  TempSrc:      SpO2: 100% 100% 96% 98%  Weight:      Height:  Intake/Output Summary (Last 24 hours) at 01/10/2021 1858 Last data filed at 01/10/2021 1826 Gross per 24 hour  Intake 472 ml  Output --  Net 472 ml   Filed Weights   01/08/21 1619  Weight: 59 kg    Examination:  General exam: appear weak, aaox3 but speech remain slurred  Respiratory system: Clear to auscultation. Respiratory effort normal. Cardiovascular system: S1 & S2 heard, RRR. No JVD, no murmur, No pedal edema. Gastrointestinal system: abdominal hernia, reducible, no ab pain, does not appear to have significant ascites, + bs. Central nervous system: Alert and oriented. Speech is slow and slurred Extremities: no edema Skin: No rashes, lesions or ulcers Psychiatry: calm and cooperative .     Data  Reviewed: I have personally reviewed following labs and imaging studies  CBC: Recent Labs  Lab 01/08/21 1634 01/09/21 0548  WBC 5.3 4.6  NEUTROABS 3.3  --   HGB 13.2 11.6*  HCT 36.5 31.8*  MCV 91.0 90.3  PLT 110* 97*    Basic Metabolic Panel: Recent Labs  Lab 01/08/21 1634 01/09/21 0548 01/10/21 0549  NA 139 137 140  K 4.5 3.5 3.8  CL 105 103 107  CO2 23 23 24   GLUCOSE 348* 316* 219*  BUN 15 14 13   CREATININE 0.94 0.81 0.97  CALCIUM 10.2 9.4 9.4  MG  --   --  1.5*    GFR: Estimated Creatinine Clearance: 48 mL/min (by C-G formula based on SCr of 0.97 mg/dL).  Liver Function Tests: Recent Labs  Lab 01/08/21 1634 01/09/21 0548  AST 54* 46*  ALT 34 30  ALKPHOS 126 103  BILITOT 5.2* 5.0*  PROT 6.0* 5.0*  ALBUMIN 3.2* 2.7*    CBG: Recent Labs  Lab 01/09/21 2131 01/10/21 0728 01/10/21 1127 01/10/21 1227 01/10/21 1640  GLUCAP 355* 232* 435* 491* 420*     Recent Results (from the past 240 hour(s))  Resp Panel by RT-PCR (Flu A&B, Covid) Nasopharyngeal Swab     Status: None   Collection Time: 01/08/21  8:50 PM   Specimen: Nasopharyngeal Swab; Nasopharyngeal(NP) swabs in vial transport medium  Result Value Ref Range Status   SARS Coronavirus 2 by RT PCR NEGATIVE NEGATIVE Final    Comment: (NOTE) SARS-CoV-2 target nucleic acids are NOT DETECTED.  The SARS-CoV-2 RNA is generally detectable in upper respiratory specimens during the acute phase of infection. The lowest concentration of SARS-CoV-2 viral copies this assay can detect is 138 copies/mL. A negative result does not preclude SARS-Cov-2 infection and should not be used as the sole basis for treatment or other patient management decisions. A negative result may occur with  improper specimen collection/handling, submission of specimen other than nasopharyngeal swab, presence of viral mutation(s) within the areas targeted by this assay, and inadequate number of viral copies(<138 copies/mL). A  negative result must be combined with clinical observations, patient history, and epidemiological information. The expected result is Negative.  Fact Sheet for Patients:  EntrepreneurPulse.com.au  Fact Sheet for Healthcare Providers:  IncredibleEmployment.be  This test is no t yet approved or cleared by the [place] FDA and  has been authorized for detection and/or diagnosis of SARS-CoV-2 by FDA under an Emergency Use Authorization (EUA). This EUA will remain  in effect (meaning this test can be used) for the duration of the COVID-19 declaration under Section 564(b)(1) of the Act, 21 U.S.C.section 360bbb-3(b)(1), unless the authorization is terminated  or revoked sooner.       Influenza A by PCR NEGATIVE NEGATIVE  Final   Influenza B by PCR NEGATIVE NEGATIVE Final    Comment: (NOTE) The Xpert Xpress SARS-CoV-2/FLU/RSV plus assay is intended as an aid in the diagnosis of influenza from Nasopharyngeal swab specimens and should not be used as a sole basis for treatment. Nasal washings and aspirates are unacceptable for Xpert Xpress SARS-CoV-2/FLU/RSV testing.  Fact Sheet for Patients: EntrepreneurPulse.com.au  Fact Sheet for Healthcare Providers: IncredibleEmployment.be  This test is not yet approved or cleared by the [place] FDA and has been authorized for detection and/or diagnosis of SARS-CoV-2 by FDA under an Emergency Use Authorization (EUA). This EUA will remain in effect (meaning this test can be used) for the duration of the COVID-19 declaration under Section 564(b)(1) of the Act, 21 U.S.C. section 360bbb-3(b)(1), unless the authorization is terminated or revoked.  Performed at [facility], [place] [address] [place], [place] [address]          Radiology Studies: MR BRAIN WO CONTRAST  Addendum Date: 01/10/2021   ADDENDUM REPORT: 01/10/2021 10:27 ADDENDUM: Study  discussed by telephone with Dr. Florencia Reasons on 01/10/2021 at 1022 hours. Electronically Signed   By: Genevie Ann M.D.   On: 01/10/2021 10:27   Result Date: 01/10/2021 CLINICAL DATA:  60 year old female with altered mental status and slurred speech. History of cirrhosis, tips. EXAM: MRI HEAD WITHOUT CONTRAST TECHNIQUE: Multiplanar, multiecho pulse sequences of the brain and surrounding structures were obtained without intravenous contrast. COMPARISON:  Head CT 01/08/2021. FINDINGS: Brain: Striking abnormal diffusion and T2/FLAIR signal symmetrically throughout the pons, with conspicuous sparing of the bilateral cortical spinal tracts (series 8, image 10, series 5, image 72). There is vague associated T1 hypointensity. The pons is mildly expanded. There is no associated hemorrhage. No other diffusion restriction. There is much less pronounced signal heterogeneity in the bilateral deep gray nuclei, primarily involving the internal capsules symmetrically. And there is mild T2 shine through and T2/FLAIR hyperintensity symmetrically affecting the posterior corona radiata/centrum semiovale also. No significant T1 signal abnormality in the deep gray nuclei. No superimposed midline shift, ventriculomegaly, extra-axial collection or acute intracranial hemorrhage. Cervicomedullary junction and pituitary are within normal limits. Minimal additional subcortical white matter T2 and FLAIR hyperintensity is nonspecific and mild for age. No cortical encephalomalacia or chronic cerebral blood products identified. Vascular: Major intracranial vascular flow voids are preserved. Skull and upper cervical spine: C4-C5 degenerative spinal stenosis related to disc herniation is visible on series 7, image 12. There is at least mild associated spinal cord mass effect. There is some generalized decreased T1 marrow signal throughout the skull and visible spine but no destructive osseous lesion is identified. Sinuses/Orbits: Postoperative changes to  both globes, negative orbits otherwise. Left sphenoid sinus disease. Other: Trace mastoid fluid on the right. Visible internal auditory structures appear normal. Negative visible scalp and face soft tissues. IMPRESSION: 1. Striking abnormal diffusion, T2 and FLAIR signal abnormality symmetrically affecting the pons with sparing of the bilateral cortical spinal tracts. Favor Osmotic Demyelination syndrome. No associated hemorrhage. No significant mass effect. 2. Relatively subtle signal changes also in the bilateral thalamus. But more pronounced in the bilateral internal capsules and cortical white matter radiations. These are nonspecific but favor also secondary to #1. 3. Generalized decreased T1 marrow signal is nonspecific but might be related to the history of cirrhosis. 4. There is C4-C5 degenerative spinal stenosis related to disc herniation with at least mild spinal cord mass effect. Electronically Signed: By: Genevie Ann M.D. On: 01/10/2021 10:12   US Abdomen  Limited  Result Date: 01/10/2021 CLINICAL DATA:  Ascites EXAM: LIMITED ABDOMEN ULTRASOUND FOR ASCITES TECHNIQUE: Limited ultrasound survey for ascites was performed in all four abdominal quadrants. COMPARISON:  09/18/2020 FINDINGS: Trace fluid in the right upper quadrant. No ascites is visualized in the left upper quadrant or bilateral lower pelvis. IMPRESSION: Trace upper abdominal ascites. This would be insufficient for paracentesis. Electronically Signed   By: Julian Hy M.D.   On: 01/10/2021 01:46        Scheduled Meds:  cholecalciferol  1,000 Units Oral QHS   ciprofloxacin  500 mg Oral Q breakfast   ferrous sulfate  325 mg Oral BID WC   insulin aspart  0-5 Units Subcutaneous QHS   insulin aspart  0-9 Units Subcutaneous TID WC   insulin aspart  12 Units Subcutaneous TID WC   insulin glargine-yfgn  20 Units Subcutaneous BID   lactulose  30 g Oral TID   loratadine  10 mg Oral Daily   multivitamin with minerals  1 tablet Oral  Daily   pantoprazole  40 mg Oral QAC breakfast   Continuous Infusions:  sodium chloride        LOS: 2 days   Time spent: 78mns Greater than 50% of this time was spent in counseling, explanation of diagnosis, planning of further management, and coordination of care.   Voice Recognition /Viviann Sparedictation system was used to create this note, attempts have been made to correct errors. Please contact the author with questions and/or clarifications.   FFlorencia Reasons MD PhD FACP [facility]  Available via Epic secure chat 7am-7pm for nonurgent issues Please page for urgent issues To page the attending provider between 7A-7P or the covering provider during after hours 7P-7A, please log into the web site [email or web address] and access using universal [place] password for that web site. If you do not have the password, please call the hospital operator.    01/10/2021, 6:58 PM

## 2021-01-10 NOTE — Plan of Care (Signed)

## 2021-01-10 NOTE — Progress Notes (Signed)
Inpatient Rehab Admissions Coordinator Note:   Per PT/OT patient was screened for CIR candidacy by Malyn Aytes Danford Bad, CCC-SLP. At this time, pt appears to be a potential candidate for CIR. I will place an order for rehab consult for full assessment, per our protocol.  Please contact me any with questions.Gayland Curry, [place], [place] Admissions Coordinator [phone] 01/10/21 4:53 PM

## 2021-01-10 NOTE — Progress Notes (Signed)
Pt is injury free, afebrile, alert, and oriented X 4. Vital signs were within the baseline during this shift. She complained of general weakness. Her speech remains slurred but understandable. Pt denies chest pain, SOB, nausea, vomiting, dizziness, signs or symptoms of bleeding or infection or acute changes during this shift. We will continue to monitor and work toward achieving the care plan goals.

## 2021-01-10 NOTE — Consult Note (Addendum)
NEURO HOSPITALIST CONSULT NOTE   Requesting physician: Dr. Erlinda Hong  Reason for Consult: MRI brain findings with concern for potential osmotic demyelination syndrome  History obtained from:  Patient and Chart    HPI:                                                                                                                                          Meagan Peck is an 60 y.o. female with a medical history significant for NASH cirrhosis s/p TIPS procedure, type 2 diabetes mellitus, essential hypertension, hyperlipidemia, esophageal varices, and GERD who presented to [facility] 9/9 for evaluation of progressive weakness and slurred speech for approximately one week with hyperammonemia at 83 on arrival and was admitted for a presumed episode of hepatic encephalopathy. [name] states that for approximately 3 weeks she has experienced extreme thirst and polyuria with uncontrolled blood glucose levels consistently in the 300-400 range at home despite compliance with her medications. She states that she has consumed at least 10-12, 12 oz waters daily over the last 3 weeks. She states that she first noticed weakness approximately one week ago of the right leg and arm that has since progressed to where she is dragging her right foot with ambulation today. In addition to right sided weakness, she noticed slurred speech at home since Wednesday 9/7 and over the past 2 days feels like she has been forgetful where she starts a task and forgets to complete the task but denies confusion. Initially, she attributed her symptoms to her elevated blood glucose levels or her elevated ammonia but with progression of symptoms, she came in for evaluation. She denies history of hepatic encephalopathy but states that she was placed on lactulose as a preventative measure but denies any history of, or current, confusion with elevated ammonia levels.   At baseline, [name] states that she walks with a cane  without difficulty and manages her own medications. She denies any changes in medications or alteration of medication doses recently. She denies recent illness or falls as well.  Past Medical History:  Diagnosis Date   Acute medial meniscus tear of right knee 12/10/2015   Allergy    Anasarca    Arthritis    bil feet   Ascites    Cataract    bilateral - surgery to remove   Cirrhosis ([place])    Diabetes mellitus without complication ([place])    type 2 - last a1c was 5.2 per patient, no meds   Dyspnea    when I have too much fluid   Esophageal varices ([facility])    GERD (gastroesophageal reflux disease)    Heart murmur    Hepatic cirrhosis ([facility])    HLD (hyperlipidemia)    no meds   Hypertension  no meds   Iron deficiency anemia 09/26/2018   Past Surgical History:  Procedure Laterality Date   BIOPSY  08/20/2019   Procedure: BIOPSY;  Surgeon: Jackquline Denmark, MD;  Location: [place];  Service: Gastroenterology;;   CARPAL TUNNEL RELEASE Right    CESAREAN SECTION     CHOLECYSTECTOMY     COLONOSCOPY  08/2019   ESOPHAGEAL BANDING N/A 09/19/2019   Procedure: ESOPHAGEAL BANDING;  Surgeon: Yetta Flock, MD;  Location: [facility];  Service: Gastroenterology;  Laterality: N/A;   ESOPHAGOGASTRODUODENOSCOPY (EGD) WITH PROPOFOL N/A 08/20/2019   Procedure: ESOPHAGOGASTRODUODENOSCOPY (EGD) WITH PROPOFOL;  Surgeon: Jackquline Denmark, MD;  Location: [place];  Service: Gastroenterology;  Laterality: N/A;   ESOPHAGOGASTRODUODENOSCOPY (EGD) WITH PROPOFOL N/A 09/19/2019   Procedure: ESOPHAGOGASTRODUODENOSCOPY (EGD) WITH PROPOFOL;  Surgeon: Yetta Flock, MD;  Location: [facility];  Service: Gastroenterology;  Laterality: N/A;   ESOPHAGOGASTRODUODENOSCOPY (EGD) WITH PROPOFOL N/A 01/16/2020   Procedure: ESOPHAGOGASTRODUODENOSCOPY (EGD) WITH PROPOFOL;  Surgeon: Jerene Bears, MD;  Location: [place] [facility];  Service: Endoscopy;  Laterality: N/A;   EYE SURGERY Bilateral    removed cataracts    GASTRIC VARICES BANDING N/A 08/20/2019   Procedure: GASTRIC VARICES BANDING;  Surgeon: Jackquline Denmark, MD;  Location: [place];  Service: Gastroenterology;  Laterality: N/A;   HOT HEMOSTASIS N/A 01/16/2020   Procedure: HOT HEMOSTASIS (ARGON PLASMA COAGULATION/BICAP);  Surgeon: Jerene Bears, MD;  Location: [place] [facility];  Service: Endoscopy;  Laterality: N/A;   IR PARACENTESIS  08/01/2019   IR PARACENTESIS  08/19/2019   IR PARACENTESIS  09/09/2019   IR PARACENTESIS  09/23/2019   IR PARACENTESIS  10/08/2019   IR PARACENTESIS  10/23/2019   IR PARACENTESIS  11/12/2019   IR PARACENTESIS  11/21/2019   IR PARACENTESIS  11/25/2019   IR PARACENTESIS  12/05/2019   IR PARACENTESIS  12/13/2019   IR PARACENTESIS  12/20/2019   IR PARACENTESIS  12/26/2019   IR PARACENTESIS  12/31/2019   IR PARACENTESIS  01/07/2020   IR PARACENTESIS  01/10/2020   IR PARACENTESIS  01/13/2020   IR PARACENTESIS  01/14/2020   IR PARACENTESIS  01/20/2020   IR PARACENTESIS  01/30/2020   IR PARACENTESIS  02/24/2020   IR RADIOLOGIST EVAL & MGMT  10/30/2019   IR RADIOLOGIST EVAL & MGMT  12/11/2019   IR RADIOLOGIST EVAL & MGMT  12/24/2019   IR RADIOLOGIST EVAL & MGMT  03/05/2020   IR RADIOLOGIST EVAL & MGMT  05/15/2020   IR TIPS  11/25/2019   IR TIPS  01/14/2020   KNEE ARTHROSCOPY Right 12/10/2015   Procedure: RIGHT KNEE ARTHROSCOPY WITH PARTIAL MEDIAL MENISCECTOMY;  Surgeon: Mcarthur Rossetti, MD;  Location: [facility];  Service: Orthopedics;  Laterality: Right;   KNEE ARTHROSCOPY W/ MENISCAL REPAIR Bilateral    MENISCUS REPAIR Left    RADIOLOGY WITH ANESTHESIA N/A 11/25/2019   Procedure: TIPS;  Surgeon: Corrie Mckusick, DO;  Location: [place];  Service: Anesthesiology;  Laterality: N/A;   RADIOLOGY WITH ANESTHESIA N/A 01/14/2020   Procedure: TIPS;  Surgeon: Corrie Mckusick, DO;  Location: [place];  Service: Anesthesiology;  Laterality: N/A;   Family History  Problem Relation Age of Onset   Hypertension Mother    COPD Mother    Alcoholism Father     Heart attack Father    Diabetes Brother    Diabetes Paternal Grandmother    Heart disease Paternal Grandmother    Alcoholism Paternal Uncle    Alcoholism Maternal Uncle    Heart disease Maternal Grandmother  Heart disease Maternal Grandfather    Heart disease Paternal Grandfather    Social History:  reports that she has never smoked. She has never used smokeless tobacco. She reports that she does not drink alcohol and does not use drugs.  Allergies  Allergen Reactions   Penicillins Anaphylaxis and Other (See Comments)    Happened as a young child   Atrovent [Ipratropium] Palpitations   Naproxen Rash   Quinine Derivatives Rash   Sulfa Antibiotics Rash   MEDICATIONS:                                                                                                                     I have reviewed the patient's current medications. Prior to Admission:  Medications Prior to Admission  Medication Sig Dispense Refill Last Dose   acetaminophen (TYLENOL) 500 MG tablet Take 500-1,000 mg by mouth every 6 (six) hours as needed for moderate pain, headache or mild pain.   unk   cetirizine (ZYRTEC) 10 MG tablet Take 1 tablet (10 mg total) by mouth daily. (Patient taking differently: Take 10 mg by mouth daily as needed for allergies.) 90 tablet 3 unk   Cholecalciferol (VITAMIN D3) 5000 UNITS TABS Take 5,000 Units by mouth at bedtime.   01/07/2021 at pm   ciprofloxacin (CIPRO) 500 MG tablet Take 500 mg by mouth daily with breakfast.   01/08/2021 at am   ferrous sulfate 325 (65 FE) MG EC tablet Take 1 tablet (325 mg total) by mouth 2 (two) times daily with a meal. 180 tablet 1 01/08/2021 at am   insulin degludec (TRESIBA) 200 UNIT/ML FlexTouch Pen INJECT 10 - 50 UNITS INTO THE SKIN DAILY. TARGET TO FASTING GLUCOSE 150. (Patient taking differently: Inject 20 Units into the skin daily after breakfast.) 15 mL 99 01/06/2021 at am   insulin lispro (HUMALOG) 200 UNIT/ML KwikPen INJECT 10 - 15 UNITS INTO THE SKIN  3 TIMES DAILY BEFORE MEALS (Patient taking differently: Inject 20 Units into the skin 3 (three) times daily after meals.) 15 mL 1 01/06/2021 at pm   Krill Oil 500 MG CAPS Take 1,000 mg by mouth 2 (two) times daily.   01/08/2021   lactulose, encephalopathy, (CHRONULAC) 10 GM/15ML SOLN Take 30 mLs (20 g total) by mouth 2 (two) times daily. 236 mL 0 01/08/2021 at am   Multiple Vitamin (MULTI-DAY PO) Take 1 tablet by mouth daily.   01/08/2021 at am   pantoprazole (PROTONIX) 40 MG tablet TAKE 1 TABLET (40 MG TOTAL) BY MOUTH DAILY. (Patient taking differently: Take 40 mg by mouth daily before breakfast.) 90 tablet 3 01/08/2021 at am   ACCU-CHEK FASTCLIX LANCETS MISC Check fsbs TID      AMBULATORY NON FORMULARY MEDICATION Wedge pillow 1 each 0    Insulin Pen Needle 32G X 4 MM MISC USE TO INJECT TRESIBA 100 each 12    magnesium oxide (MAG-OX) 400 (240 Mg) MG tablet TAKE 1 TABLET BY MOUTH TWICE DAILY (Patient not taking: No sig reported) 120  tablet 1 Not Taking   Scheduled:  cholecalciferol  1,000 Units Oral QHS   ciprofloxacin  500 mg Oral Q breakfast   ferrous sulfate  325 mg Oral BID WC   insulin aspart  0-5 Units Subcutaneous QHS   insulin aspart  0-9 Units Subcutaneous TID WC   insulin aspart  12 Units Subcutaneous TID WC   insulin glargine-yfgn  20 Units Subcutaneous BID   lactulose  30 g Oral TID   loratadine  10 mg Oral Daily   multivitamin with minerals  1 tablet Oral Daily   pantoprazole  40 mg Oral QAC breakfast   ROS:                                                                                                                                       History obtained from chart review and the patient  General ROS: negative for - chills, fatigue, fever, night sweats, weight gain or weight loss Psychological ROS: negative for - behavioral disorder, hallucinations, memory difficulties, or mood swings Ophthalmic ROS: negative for - blurry vision, double vision, eye pain or acute loss of vision ENT  ROS: negative for - epistaxis, nasal discharge, oral lesions, or sore throat Allergy and Immunology ROS: negative for - hives or itchy/watery eyes Hematological and Lymphatic ROS: negative for - bleeding problems, bruising or swollen lymph nodes Endocrine ROS: negative for - hair pattern changes, or temperature intolerance Respiratory ROS: negative for - cough, hemoptysis, shortness of breath or wheezing Cardiovascular ROS: negative for - chest pain, dyspnea on exertion, edema or irregular heartbeat Gastrointestinal ROS: negative for - abdominal pain, diarrhea, hematemesis, nausea/vomiting or stool incontinence Genito-Urinary ROS: negative for - dysuria, hematuria, incontinence, or urinary urgency Musculoskeletal ROS: negative for - joint swelling or muscular weakness Neurological ROS: as noted in HPI Dermatological ROS: negative for rash and skin lesion changes  Blood pressure (!) 129/56, pulse 73, temperature 97.9 F (36.6 C), resp. rate 20, height 4' 10"  (1.473 m), weight 59 kg, last menstrual period 12/03/2013, SpO2 96 %.   General Examination:                                                                                                       Physical Exam  Head: Normocephalic and atraumatic without obvious abnormality EENT: Normal external eye and conjunctiva. No scleral icterus. Wears eyeglasses at baseline. 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  Cardiovascular- No pedal edema, pulses palpable throughout   Lungs- regular respirations, no  excessive working breathing.  Saturations within normal limits Abdomen- All 4 quadrants palpated and nontender Extremities- Warm, dry and intact without obvious deformity Musculoskeletal- no joint tenderness, deformity or swelling Skin- warm and dry, no hyperpigmentation, vitiligo, or suspicious lesions  Neurological Examination Mental Status: Awake, alert, oriented x 4, thought content appropriate.   Speech is moderately dysarthric; patient has to  frequently repeat sentences for examiner's understanding.  Naming, repetition, fluency, and comprehension are intact without aphasia.  Able to follow 3 step commands without difficulty. No neglect is noted.  Cranial Nerves: II: Visual fields grossly normal OS, blind from birth OD III,IV, VI: No ptosis or nystagmus noted, left eye EOMI, right eye does not cross midline to right but has normal motility to the left (baseline right eye deficits from birth), PERRL 4 mm / brisk V,VII: Smile elevates symmetrically, facial light touch sensation normal bilaterally VIII: Hearing normal bilaterally IX,X: Palate rises symmetrically XI: Bilateral shoulders shrug symmetrically XII: Midline tongue extension Motor: Right : Upper extremity   4/5    Left:     Upper extremity   4+/5  Lower extremity   4/5     Lower extremity   4+/5 Tone and bulk:normal tone throughout; no atrophy noted Sensory: Pinprick and light touch intact throughout, bilaterally Deep Tendon Reflexes: 3+ and symmetric throughout Cerebellar: normal finger-to-nose and normal heel-to-shin test with bradykinetic movements bilaterally (complains of right hip pain with HKS on the right) Gait: Gait deferred for patient safety due to complaints of weakness, OT at bedside on arrival with reports that patient drags her right foot with ambulation with an incoordinated gait   Lab Results: Basic Metabolic Panel: Recent Labs  Lab 01/08/21 1634 01/09/21 0548 01/10/21 0549  NA 139 137 140  K 4.5 3.5 3.8  CL 105 103 107  CO2 23 23 24   GLUCOSE 348* 316* 219*  BUN 15 14 13   CREATININE 0.94 0.81 0.97  CALCIUM 10.2 9.4 9.4  MG  --   --  1.5*   CBC: Recent Labs  Lab 01/08/21 1634 01/09/21 0548  WBC 5.3 4.6  NEUTROABS 3.3  --   HGB 13.2 11.6*  HCT 36.5 31.8*  MCV 91.0 90.3  PLT 110* 97*   Cardiac Enzymes: No results for input(s): CKTOTAL, CKMB, CKMBINDEX, TROPONINI in the last 168 hours.  Lipid Panel: No results for input(s): CHOL,  TRIG, HDL, CHOLHDL, VLDL, LDLCALC in the last 168 hours.  Imaging: CT Head Wo Contrast  Result Date: 01/08/2021 CLINICAL DATA:  Slurred speech, unsteady gait, weakness EXAM: CT HEAD WITHOUT CONTRAST TECHNIQUE: Contiguous axial images were obtained from the base of the skull through the vertex without intravenous contrast. COMPARISON:  None. FINDINGS: Brain: No acute infarct or hemorrhage. Lateral ventricles and midline structures are unremarkable. No acute extra-axial fluid collections. No mass effect. Vascular: No hyperdense vessel. Mild diffuse atherosclerosis of the internal carotid arteries. Skull: Normal. Negative for fracture or focal lesion. Sinuses/Orbits: No acute finding. Other: None. IMPRESSION: 1. No acute intracranial process. Electronically Signed   By: Randa Ngo M.D.   On: 01/08/2021 17:57   MR BRAIN WO CONTRAST  Addendum Date: 01/10/2021   ADDENDUM REPORT: 01/10/2021 10:27 ADDENDUM: Study discussed by telephone with Dr. Florencia Reasons on 01/10/2021 at 1022 hours. Electronically Signed   By: Genevie Ann M.D.   On: 01/10/2021 10:27   Result Date: 01/10/2021 CLINICAL DATA:  60 year old female with altered mental status and slurred speech. History of cirrhosis, tips. EXAM: MRI HEAD WITHOUT CONTRAST TECHNIQUE:  Multiplanar, multiecho pulse sequences of the brain and surrounding structures were obtained without intravenous contrast. COMPARISON:  Head CT 01/08/2021. FINDINGS: Brain: Striking abnormal diffusion and T2/FLAIR signal symmetrically throughout the pons, with conspicuous sparing of the bilateral cortical spinal tracts (series 8, image 10, series 5, image 72). There is vague associated T1 hypointensity. The pons is mildly expanded. There is no associated hemorrhage. No other diffusion restriction. There is much less pronounced signal heterogeneity in the bilateral deep gray nuclei, primarily involving the internal capsules symmetrically. And there is mild T2 shine through and T2/FLAIR  hyperintensity symmetrically affecting the posterior corona radiata/centrum semiovale also. No significant T1 signal abnormality in the deep gray nuclei. No superimposed midline shift, ventriculomegaly, extra-axial collection or acute intracranial hemorrhage. Cervicomedullary junction and pituitary are within normal limits. Minimal additional subcortical white matter T2 and FLAIR hyperintensity is nonspecific and mild for age. No cortical encephalomalacia or chronic cerebral blood products identified. Vascular: Major intracranial vascular flow voids are preserved. Skull and upper cervical spine: C4-C5 degenerative spinal stenosis related to disc herniation is visible on series 7, image 12. There is at least mild associated spinal cord mass effect. There is some generalized decreased T1 marrow signal throughout the skull and visible spine but no destructive osseous lesion is identified. Sinuses/Orbits: Postoperative changes to both globes, negative orbits otherwise. Left sphenoid sinus disease. Other: Trace mastoid fluid on the right. Visible internal auditory structures appear normal. Negative visible scalp and face soft tissues. IMPRESSION: 1. Striking abnormal diffusion, T2 and FLAIR signal abnormality symmetrically affecting the pons with sparing of the bilateral cortical spinal tracts. Favor Osmotic Demyelination syndrome. No associated hemorrhage. No significant mass effect. 2. Relatively subtle signal changes also in the bilateral thalamus. But more pronounced in the bilateral internal capsules and cortical white matter radiations. These are nonspecific but favor also secondary to #1. 3. Generalized decreased T1 marrow signal is nonspecific but might be related to the history of cirrhosis. 4. There is C4-C5 degenerative spinal stenosis related to disc herniation with at least mild spinal cord mass effect. Electronically Signed: By: Genevie Ann M.D. On: 01/10/2021 10:12   US Abdomen Limited  Result Date:  01/10/2021 CLINICAL DATA:  Ascites EXAM: LIMITED ABDOMEN ULTRASOUND FOR ASCITES TECHNIQUE: Limited ultrasound survey for ascites was performed in all four abdominal quadrants. COMPARISON:  09/18/2020 FINDINGS: Trace fluid in the right upper quadrant. No ascites is visualized in the left upper quadrant or bilateral lower pelvis. IMPRESSION: Trace upper abdominal ascites. This would be insufficient for paracentesis. Electronically Signed   By: Julian Hy M.D.   On: 01/10/2021 01:46    Assessment: 60 year old female admitted with hepatic encephalopathy, slurred speech and weakness. 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  1. Na was 139 on the 9th, 137 on the 10th and 140 today. Corresponding glucoses 348 >> 316 >> 219.  She complains of uncontrolled blood glucose levels at home with at least 3 weeks of polyuria and polydipsia.  2. Normal renal function on labs. 3. AST 46, ALT 30. Elevated total bilirubin. Serum albumin and total protein are low. History of NASH.  4. Hyperammonemic at 139, increased from level of 83 on admission.  5. Exam reveals patient with moderate-to-severe dysarthria, right-sided weakness, and impaired gait for approximately one week with progression of weakness now involving right foot dragging with the use of a walker today.  6. MRI brain: Striking abnormal diffusion, T2 and  FLAIR signal abnormality symmetrically affecting the pons with sparing of the bilateral cortical spinal tracts. Favor Osmotic Demyelination syndrome. Also noted are relatively subtle signal changes also in the bilateral thalamus, but more pronounced in the bilateral internal capsules and cortical white matter radiations. These are nonspecific but favor also secondary to osmotic demyelination syndrome. 7. Also noted on MRI brain is incidentally imaged C4-C5 degenerative spinal stenosis related to disc herniation  with at least mild spinal cord mass effect. 8. 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 It is possible that her sodium levels had been abnormal in the setting of her uncontrolled blood glucose, polyuria, and polydipsia, or as a complication of advanced cirrhosis ([place] Myelinolysis and Cirrhosis of the Liver, Curley Spice, et al., American Journal of Clinical Pathology, Volume 46, Issue 2, 30 November 1964, Pages 239-244), with correction prior to hospitalization with levels consistently in range since presentation. It is possible that symptoms started days after a rapid sodium correction at home.   Recommendations: 1. MRI cervical spine without contrast.  2. Management of blood glucose per primary team 3. Management of hyperammonemia per GI team  4. Continue PT/OT as you are 5. Will need improved glycemic control at home in the future.  6. Patient advised regarding the health risks of polydipsia. Advised to seek immediate medical attention in the future if she should experience high levels of thirst with potential to lead to polydipsia.   Electronically signed: Dr. Kerney Elbe 01/10/2021, 12:45 PM

## 2021-01-10 NOTE — Evaluation (Signed)
Occupational Therapy Evaluation Patient Details Name: Meagan Peck MRN: [id number] DOB: [birth year] Today's Date: 01/10/2021    History of Present Illness 60 yo female admitted with hepatic encephalopathy, weakness, slurred speech. Hx of NASH, DM, hepatic encephalopathy, cirrhosis. MRI reporing Striking abnormal diffusion, T2 and FLAIR signal abnormality  symmetrically affecting the pons with sparing of the bilateral  cortical spinal tracts.  Favor Osmotic Demyelination syndrome.   Clinical Impression   [name] is a 60 year old woman who lives at home with family is normally independent with ADLs (except for assistance with bathing) and ambulates with a cane. On evaluation she demonstrates right sided weakness and incoordination, dysarthria, decreased activity tolerance, impaired balance and dizziness. On evaluation she transferred herself to edge of bed with increased time and use of bed rail. She was min assist to stand ambulate to [facility] with RW - exhibiting difficulty advancing RLE and dragging right foot. She required increased assistance with ADLs due to impaired balance and difficulty using right dominant hand. Patient able to grossly perform finger to thumb with right hand but movement slow compared to left hand with some mild ataxia. Also ataxia with gross motor coordination but functional. 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 Does not report sensation deficits. Once patient returned to supine therapist began to assist with repositioning higher up in bed. Patient assisting and when she lifted her chin up and brought her head to bed she had sudden onset of profound dizziness and cried out and began crying. After patient settled attempted to assess head movement. No complaints of dizziness with eye movement up and down but some with left to right. No overt symptoms with chin to chest but immediate symptoms when neck extended. Patient will benefit from  skilled OT services while in hospital to improve deficits and learn compensatory strategies as needed in order to return to PLOF. Patient is pleasant, motivated and has great family support. Recommend aggressive rehab at discharge to maximize patient's functional abilities.     Follow Up Recommendations  CIR    Equipment Recommendations  None recommended by OT    Recommendations for Other Services       Precautions / Restrictions Precautions Precautions: Fall Precaution Comments: RLE weankess Restrictions Weight Bearing Restrictions: No      Mobility Bed Mobility Overal bed mobility: Needs Assistance Bed Mobility: Supine to Sit;Sit to Supine     Supine to sit: Min guard;HOB elevated Sit to supine: Min guard;HOB elevated   General bed mobility comments: Increased time. Relies on bedrail to before transfers.    Transfers Overall transfer level: Needs assistance Equipment used: Rolling walker (2 wheeled) Transfers: Sit to/from [facility] Sit to Stand: Min assist Stand pivot transfers: Min assist       General transfer comment: Min assist for steadying with ambulation in room with RW. Difficulty with advancing RLE and tending to leave behind - fatigued quickly and dragging more with limited mobility.    Balance Overall balance assessment: Needs assistance Sitting-balance support: No upper extremity supported;Feet unsupported Sitting balance-Leahy Scale: Good Sitting balance - Comments: able to bend over   Standing balance support: Bilateral upper extremity supported;Single extremity supported Standing balance-Leahy Scale: Poor                             ADL either performed or assessed with clinical judgement   ADL Overall ADL's : Needs assistance/impaired Eating/Feeding:  Set up;Sitting Eating/Feeding Details (indicate cue type and reason): with difficulty due to impaired coordination Grooming: Set up;Sitting   Upper Body Bathing:  Set up;Sitting   Lower Body Bathing: Moderate assistance;Sit to/from stand   Upper Body Dressing : Set up;Sitting Upper Body Dressing Details (indicate cue type and reason): shirt without fasteners Lower Body Dressing: Sit to/from stand;Maximal assistance   Toilet Transfer: Minimal assistance;BSC;Ambulation;RW   Toileting- Clothing Manipulation and Hygiene: Sit to/from stand;Moderate assistance Toileting - Clothing Manipulation Details (indicate cue type and reason): patient assisted with clothing management but limited by poor balance and needing one arm on walker, patient able to wipe Tub/ Shower Transfer: Minimal assistance;Shower seat   Functional mobility during ADLs: Minimal assistance;Rolling walker       Vision Baseline Vision/History: 1 Wears glasses Patient Visual Report: No change from baseline Additional Comments: 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     Perception     Praxis      Pertinent Vitals/Pain Pain Assessment: No/denies pain     Hand Dominance Right   Extremity/Trunk Assessment Upper Extremity Assessment Upper Extremity Assessment: RUE deficits/detail;LUE deficits/detail RUE Deficits / Details: 4-/5 shoulder strength, 4/5 bicep strength, 4+/5 tricep strength, 4+/5 wrist, 4/5 grip RUE Sensation: WNL RUE Coordination: decreased fine motor;decreased gross motor (bradykinesia with finger to thumb, mild ataxia and bradykinesia with finger to nose) LUE Deficits / Details: WFL ROM, 4+/5 strength LUE Sensation: WNL LUE Coordination: WNL   Lower Extremity Assessment Lower Extremity Assessment: Defer to PT evaluation   Cervical / Trunk Assessment Cervical / Trunk Assessment: Normal   Communication Communication Communication: Expressive difficulties (slurred speech, dysarthria)   Cognition Arousal/Alertness: Awake/alert Behavior During Therapy: WFL for tasks assessed/performed Overall  Cognitive Status: Within Functional Limits for tasks assessed                                     General Comments       Exercises     Shoulder Instructions      Home Living Family/patient expects to be discharged to:: Private residence Living Arrangements: Spouse/significant other;Children Available Help at Discharge: Family;Available 24 hours/day Type of Home: House Home Access: Stairs to enter     Home Layout: Able to live on main level with bedroom/bathroom     Bathroom Shower/Tub: Teacher, early years/pre: Standard                Prior Functioning/Environment Level of Independence: Needs assistance  Gait / Transfers Assistance Needed: uses cane for ambulation. ADL's / Homemaking Assistance Needed: daughter helps with bathing,            OT Problem List: Decreased strength;Decreased range of motion;Decreased activity tolerance;Impaired balance (sitting and/or standing);Impaired vision/perception;Decreased coordination;Decreased knowledge of use of DME or AE;Impaired UE functional use      OT Treatment/Interventions: Self-care/ADL training;Therapeutic exercise;Neuromuscular education;DME and/or AE instruction;Therapeutic activities;Patient/family education;Balance training    OT Goals(Current goals can be found in the care plan section) Acute Rehab OT Goals Patient Stated Goal: get better and be independent OT Goal Formulation: With patient Time For Goal Achievement: 01/24/21 Potential to Achieve Goals: Good  OT Frequency: Min 2X/week   Barriers to D/C:            Co-evaluation              AM-PAC OT "6  Clicks" Daily Activity     Outcome Measure Help from another person eating meals?: A Little Help from another person taking care of personal grooming?: A Little Help from another person toileting, which includes using toliet, bedpan, or urinal?: A Lot Help from another person bathing (including washing, rinsing,  drying)?: A Lot Help from another person to put on and taking off regular upper body clothing?: A Little Help from another person to put on and taking off regular lower body clothing?: A Lot 6 Click Score: 15   End of Session Equipment Utilized During Treatment: Rolling walker Nurse Communication: Mobility status  Activity Tolerance: Patient tolerated treatment well Patient left: in bed;with call bell/phone within reach  OT Visit Diagnosis: Unsteadiness on feet (R26.81);Hemiplegia and hemiparesis;Dizziness and giddiness (R42) Hemiplegia - Right/Left: Right Hemiplegia - dominant/non-dominant: Dominant Hemiplegia - caused by: Other cerebrovascular disease                Time: 3005-1102 OT Time Calculation (min): 25 min Charges:  OT General Charges $OT Visit: 1 Visit OT Evaluation $OT Eval Moderate Complexity: 1 Mod  Kailon Treese, OTR/L [place]  Office [phone] Pager: [place] 01/10/2021, 3:56 PM

## 2021-01-10 NOTE — Progress Notes (Signed)
[name] CBG's increasingly becoming higher through the afternoon. Lunch CBG high 400's, MD made aware and new orders were placed. Dinner CBG 420, given sliding scale insulin and with meals coverage. Will reassess patient's blood sugar. MD aware.

## 2021-01-10 NOTE — Evaluation (Addendum)
Physical Therapy Evaluation Patient Details Name: Meagan Peck MRN: [id number] DOB: 1960-08-11 Today's Date: 01/10/2021   History of Present Illness  60 yo female admitted with hepatic encephalopathy, weakness, slurred speech. Hx of NASH, DM, hepatic encephalopathy, cirrhosis. 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 MRI brain  incidentally imaged C4-C5 degenerative spinal stenosis related to disc herniation with at least mild spinal cord mass effect.   Clinical Impression  On eval, pt required Mod A for mobility. She was able to take a few steps in room with significant assistance from therapist. Pt presents with weakness (R side appears a little weaker than L), impaired coordination, impaired balance, and dizziness. Will follow and progress activity as tolerated. Unsure of d/c plan-will need TOC consult to see what options are available to pt, if any. At this time, PT recommendation is for CIR consult.     Follow Up Recommendations CIR    Equipment Recommendations  Rolling walker with 5" wheels    Recommendations for Other Services OT consult     Precautions / Restrictions Precautions Precautions: Fall Restrictions Weight Bearing Restrictions: No      Mobility  Bed Mobility Overal bed mobility: Needs Assistance Bed Mobility: Supine to Sit;Sit to Supine     Supine to sit: Min guard;HOB elevated Sit to supine: Min guard;HOB elevated   General bed mobility comments: Increased time. Relies on bedrail    Transfers Overall transfer level: Needs assistance Equipment used: 1 person hand held assist Transfers: Sit to/from Stand Sit to Stand: Min assist         General transfer comment: Assist to rise, steady, control descent. Wide BOS with pt preferring to use bedrail for support on opposite side in addition to HHA.  Ambulation/Gait Ambulation/Gait assistance: Mod assist Gait  Distance (Feet): 5 Feet Assistive device: 1 person hand held assist Gait Pattern/deviations: Wide base of support;Step-to pattern     General Gait Details: Assist to stabilize pt. Very unsteady with risk for falls. Pt walked 5 feet forwards then 5 feet backwards (increased difficulty with backwards walking)  Stairs            Wheelchair Mobility    Modified Rankin (Stroke Patients Only)       Balance Overall balance assessment: Needs assistance         Standing balance support: Bilateral upper extremity supported;Single extremity supported Standing balance-Leahy Scale: Poor                               Pertinent Vitals/Pain Pain Assessment: No/denies pain    Home Living Family/patient expects to be discharged to:: Private residence Living Arrangements: Spouse/significant other;Children   Type of Home: House Home Access: Stairs to enter     Home Layout: Able to live on main level with bedroom/bathroom Home Equipment: Kasandra Knudsen - single point      Prior Function Level of Independence: Needs assistance   Gait / Transfers Assistance Needed: uses cane for ambulation.  ADL's / Homemaking Assistance Needed: mostly sponge bathes        Hand Dominance        Extremity/Trunk Assessment   Upper Extremity Assessment Upper Extremity Assessment: Defer to OT evaluation    Lower Extremity Assessment Lower Extremity Assessment: Generalized weakness (R LE seems a little weaker;movement is a bit slower compared to L. Overall, strength is at least 3+/5)  Communication   Communication: Expressive difficulties (slurred speech)  Cognition Arousal/Alertness: Awake/alert Behavior During Therapy: WFL for tasks assessed/performed Overall Cognitive Status: Within Functional Limits for tasks assessed                                        General Comments      Exercises     Assessment/Plan    PT Assessment Patient needs continued  PT services  PT Problem List Decreased strength;Decreased mobility;Decreased activity tolerance;Decreased balance;Decreased knowledge of use of DME       PT Treatment Interventions DME instruction;Gait training;Therapeutic exercise;Balance training;Functional mobility training;Therapeutic activities;Patient/family education    PT Goals (Current goals can be found in the Care Plan section)  Acute Rehab PT Goals Patient Stated Goal: to regain PLOF PT Goal Formulation: With patient Time For Goal Achievement: 01/24/21 Potential to Achieve Goals: Good    Frequency Min 3X/week   Barriers to discharge        Co-evaluation               AM-PAC PT "6 Clicks" Mobility  Outcome Measure Help needed turning from your back to your side while in a flat bed without using bedrails?: A Little Help needed moving from lying on your back to sitting on the side of a flat bed without using bedrails?: A Little Help needed moving to and from a bed to a chair (including a wheelchair)?: A Lot Help needed standing up from a chair using your arms (e.g., wheelchair or bedside chair)?: A Lot Help needed to walk in hospital room?: A Lot Help needed climbing 3-5 steps with a railing? : Total 6 Click Score: 13    End of Session Equipment Utilized During Treatment: Gait belt Activity Tolerance: Patient tolerated treatment well Patient left: in bed;with call bell/phone within reach;with bed alarm set   PT Visit Diagnosis: Muscle weakness (generalized) (M62.81);Difficulty in walking, not elsewhere classified (R26.2)    Time: 1030-1050 PT Time Calculation (min) (ACUTE ONLY): 20 min   Charges:   PT Evaluation $PT Eval Moderate Complexity: 1 Mod            Doreatha Massed, PT Acute Rehabilitation  Office: [phone] Pager: [phone]

## 2021-01-11 ENCOUNTER — Inpatient Hospital Stay (HOSPITAL_COMMUNITY): Payer: Self-pay

## 2021-01-11 LAB — BASIC METABOLIC PANEL
Anion gap: 10 (ref 5–15)
BUN: 14 mg/dL (ref 6–20)
CO2: 23 mmol/L (ref 22–32)
Calcium: 8.9 mg/dL (ref 8.9–10.3)
Chloride: 110 mmol/L (ref 98–111)
Creatinine, Ser: 0.76 mg/dL (ref 0.44–1.00)
GFR, Estimated: >60 mL/min (ref >60)
Glucose, Bld: 142 mg/dL — ABNORMAL HIGH (ref 70–99)
Potassium: 3.7 mmol/L (ref 3.5–5.1)
Sodium: 143 mmol/L (ref 135–145)

## 2021-01-11 LAB — GLUCOSE, CAPILLARY
Glucose-Capillary: 126 mg/dL — ABNORMAL HIGH (ref 70–99)
Glucose-Capillary: 243 mg/dL — ABNORMAL HIGH (ref 70–99)
Glucose-Capillary: 288 mg/dL — ABNORMAL HIGH (ref 70–99)

## 2021-01-11 NOTE — Progress Notes (Signed)
Inpatient Diabetes Program Recommendations  AACE/ADA: New Consensus Statement on Inpatient Glycemic Control (2015)  Target Ranges:  Prepandial:   less than 140 mg/dL      Peak postprandial:   less than 180 mg/dL (1-2 hours)      Critically ill patients:  140 - 180 mg/dL   Lab Results  Component Value Date   GLUCAP 126 (H) 01/11/2021   HGBA1C 10.7 (H) 01/09/2021    Review of Glycemic Control Results for Meagan Peck, Meagan Peck (MRN [id number]) as of 01/11/2021 11:40  Ref. Range 01/10/2021 12:27 01/10/2021 16:40 01/10/2021 20:24 01/11/2021 08:22  Glucose-Capillary Latest Ref Range: 70 - 99 mg/dL 491 (H) 420 (H) 299 (H) 126 (H)   Diabetes history: Type 2 DM Outpatient Diabetes medications: Tresiba 20 units QD, Humalog 20 units TID Current orders for Inpatient glycemic control: Semglee 20 units BID, Novolog 12 units TID, Novolog 0-9 units TID & HS  Inpatient Diabetes Program Recommendations:    Consider increasing Novolog 15 units TID (assuming patient is consuming >50% of meals).  Spoke with patient regarding outpatient diabetes management.  Reviewed patient's current A1c of 10.7%. Explained what a A1c is and what it measures. Also reviewed goal A1c with patient, importance of good glucose control @ home, and blood sugar goals. Reviewed patho of DM, need for insulin, role of pancreas and liver, vascular changes and commorbidities.  Patient has a meter and supplies and reports blood sugars have been higher lately. Admits to occasionally missing doses since losing insurance. Reviewed when to call MD. Will place [facility] consult.  States that things have been much harder since losing job and insurance to keep up with her diabetes.  Dr Eliseo Squires at bedside and aware.  Thanks, Bronson Curb, MSN, RNC-OB Diabetes Coordinator [phone] (8a-5p)

## 2021-01-11 NOTE — Progress Notes (Signed)
PROGRESS NOTE    Meagan Peck  KPT:[id number] DOB: June 13, 1960 DOA: 01/08/2021 PCP: Pcp, No    No chief complaint on file.   Brief Narrative:  Meagan Peck is a 60 y.o. female with medical history significant of [name] cirrhosis with previous hepatic encephalopathy, esophageal varices, insulin dependent diabetes, essential hypertension, hyperlipidemia, , GERD who was brought in by family secondary to confusion weakness for about a week.  MRI suggestive of osmotic demyelination.    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 It is possible that her sodium levels had been abnormal in the setting of her uncontrolled blood glucose, polyuria, and polydipsia, or as a complication of advanced cirrhosis   Hepatic encephalopathy  Ua unremarkable Ammonia elevated: increase lactulose,  for goal of bm 3-4times per 24hrs   Orthostatics mildly positive -add TED hose when up -s/p hydration  Hypomagnesemia Replace mag  NASH cirrhosis with history of esophageal varices, thrombocytopenia Appears to be on Cipro chronically which is continued She does not appear to have significant ascites on exam, she denies abdominal pain ab [place] minimal ascites , This would be insufficient for paracentesis.  Insulin-dependent type 2 diabetes, uncontrolled, with hyperglycemia A1c 10.7 -increase long acting  insulin to twice daily, increase meal coverage, continue SSI -met with DM coordinator, did not have insulin at  home as no insurance  Generalized weakness,  PT recommends CIR    DVT prophylaxis: SCDs Start: 01/08/21 2128   Code Status: Full Family Communication: None at bedside Disposition:   Status is: Inpatient  Dispo: The patient is from: Home              Anticipated d/c is to: CIR              Anticipated d/c date is:                Consultants:  Neurology     Antimicrobials:    Anti-infectives (From admission, onward)    Start     Dose/Rate Route Frequency Ordered Stop   01/09/21 0800  ciprofloxacin (CIPRO) tablet 500 mg        500 mg Oral Daily with breakfast 01/08/21 2127            Objective: Vitals:   01/10/21 2021 01/11/21 0531 01/11/21 0900 01/11/21 1100  BP: (!) 129/55 134/63    Pulse: 77 73    Resp: 20  16 20   Temp: 98.4 F (36.9 C) 98.1 F (36.7 C)    TempSrc: Oral Oral    SpO2: 97% 100%    Weight:      Height:        Intake/Output Summary (Last 24 hours) at 01/11/2021 1212 Last data filed at 01/11/2021 0900 Gross per 24 hour  Intake 476 ml  Output --  Net 476 ml   Filed Weights   01/08/21 1619  Weight: 59 kg    Examination:   General: Appearance:     Overweight/frailfemale in no acute distress     Lungs:  respirations unlabored  Heart:    Normal heart rate.    MS:   All extremities are intact.    Neurologic:   Awake       Data Reviewed: I have personally reviewed following labs and imaging studies  CBC: Recent Labs  Lab 01/08/21 1634 01/09/21 0548  WBC 5.3 4.6  NEUTROABS 3.3  --   HGB 13.2 11.6*  HCT 36.5 31.8*  MCV 91.0 90.3  PLT 110* 97*    Basic Metabolic Panel: Recent Labs  Lab 01/08/21 1634 01/09/21 0548 01/10/21 0549 01/11/21 0447  NA 139 137 140 143  K 4.5 3.5 3.8 3.7  CL 105 103 107 110  CO2 23 23 24 23   GLUCOSE 348* 316* 219* 142*  BUN 15 14 13 14   CREATININE 0.94 0.81 0.97 0.76  CALCIUM 10.2 9.4 9.4 8.9  MG  --   --  1.5*  --     GFR: Estimated Creatinine Clearance: 58.2 mL/min (by C-G  formula based on SCr of 0.76 mg/dL).  Liver Function Tests: Recent Labs  Lab 01/08/21 1634 01/09/21 0548  AST 54* 46*  ALT 34 30  ALKPHOS 126 103  BILITOT 5.2* 5.0*  PROT 6.0* 5.0*  ALBUMIN 3.2* 2.7*    CBG: Recent Labs  Lab 01/10/21 1127 01/10/21 1227 01/10/21 1640 01/10/21 2024 01/11/21 0822  GLUCAP 435* 491* 420* 299* 126*     Recent Results (from the past 240 hour(s))  Resp Panel by RT-PCR (Flu A&B, Covid) Nasopharyngeal Swab     Status: None   Collection Time: 01/08/21  8:50 PM   Specimen: Nasopharyngeal Swab; Nasopharyngeal(NP) swabs in vial transport medium  Result Value Ref Range Status   SARS Coronavirus 2 by RT PCR NEGATIVE NEGATIVE Final    Comment: (NOTE) SARS-CoV-2 target nucleic acids are NOT DETECTED.  The SARS-CoV-2 RNA is generally detectable in upper respiratory specimens during the acute phase of infection. The lowest concentration of SARS-CoV-2 viral copies this assay can detect is 138 copies/mL. A negative result does not preclude SARS-Cov-2 infection and should not be used as the sole basis for treatment or other patient management decisions. A negative result may occur with  improper specimen collection/handling, submission of specimen other than nasopharyngeal swab, presence of viral mutation(s) within the areas targeted by this assay, and inadequate number of viral copies(<138 copies/mL). A negative result must be combined with clinical observations, patient history, and epidemiological information. The expected result is Negative.  Fact Sheet for Patients:  EntrepreneurPulse.com.au  Fact Sheet for Healthcare Providers:  IncredibleEmployment.be  This test is no t yet approved or cleared by the [place] FDA and  has been authorized for detection and/or diagnosis of SARS-CoV-2 by FDA under an Emergency Use Authorization (EUA). This EUA will remain  in effect (meaning this test can be used) for the  duration of the COVID-19 declaration under Section 564(b)(1) of the Act, 21 U.S.C.section 360bbb-3(b)(1), unless the authorization is terminated  or revoked sooner.       Influenza A by PCR NEGATIVE NEGATIVE Final   Influenza B by PCR NEGATIVE NEGATIVE Final    Comment: (NOTE) The Xpert Xpress SARS-CoV-2/FLU/RSV plus assay is intended as an aid in the diagnosis of influenza from Nasopharyngeal swab specimens and should not be used as a sole basis for treatment. Nasal washings and aspirates are unacceptable for Xpert Xpress SARS-CoV-2/FLU/RSV testing.  Fact Sheet for Patients: EntrepreneurPulse.com.au  Fact Sheet for Healthcare Providers: IncredibleEmployment.be  This test is not yet approved or  cleared by the [place] and has been authorized for detection and/or diagnosis of SARS-CoV-2 by FDA under an Emergency Use Authorization (EUA). This EUA will remain in effect (meaning this test can be used) for the duration of the COVID-19 declaration under Section 564(b)(1) of the Act, 21 U.S.C. section 360bbb-3(b)(1), unless the authorization is terminated or revoked.  Performed at [facility], [place] [address] [place],  [address]          Radiology Studies: MR BRAIN WO CONTRAST  Addendum Date: 01/10/2021   ADDENDUM REPORT: 01/10/2021 10:27 ADDENDUM: Study discussed by telephone with Dr. Florencia Reasons on 01/10/2021 at 1022 hours. Electronically Signed   By: Genevie Ann M.D.   On: 01/10/2021 10:27   Result Date: 01/10/2021 CLINICAL DATA:  60 year old female with altered mental status and slurred speech. History of cirrhosis, tips. EXAM: MRI HEAD WITHOUT CONTRAST TECHNIQUE: Multiplanar, multiecho pulse sequences of the brain and surrounding structures were obtained without intravenous contrast. COMPARISON:  Head CT 01/08/2021. FINDINGS: Brain: Striking abnormal diffusion and T2/FLAIR signal symmetrically throughout the pons,  with conspicuous sparing of the bilateral cortical spinal tracts (series 8, image 10, series 5, image 72). There is vague associated T1 hypointensity. The pons is mildly expanded. There is no associated hemorrhage. No other diffusion restriction. There is much less pronounced signal heterogeneity in the bilateral deep gray nuclei, primarily involving the internal capsules symmetrically. And there is mild T2 shine through and T2/FLAIR hyperintensity symmetrically affecting the posterior corona radiata/centrum semiovale also. No significant T1 signal abnormality in the deep gray nuclei. No superimposed midline shift, ventriculomegaly, extra-axial collection or acute intracranial hemorrhage. Cervicomedullary junction and pituitary are within normal limits. Minimal additional subcortical white matter T2 and FLAIR hyperintensity is nonspecific and mild for age. No cortical encephalomalacia or chronic cerebral blood products identified. Vascular: Major intracranial vascular flow voids are preserved. Skull and upper cervical spine: C4-C5 degenerative spinal stenosis related to disc herniation is visible on series 7, image 12. There is at least mild associated spinal cord mass effect. There is some generalized decreased T1 marrow signal throughout the skull and visible spine but no destructive osseous lesion is identified. Sinuses/Orbits: Postoperative changes to both globes, negative orbits otherwise. Left sphenoid sinus disease. Other: Trace mastoid fluid on the right. Visible internal auditory structures appear normal. Negative visible scalp and face soft tissues. IMPRESSION: 1. Striking abnormal diffusion, T2 and FLAIR signal abnormality symmetrically affecting the pons with sparing of the bilateral cortical spinal tracts. Favor Osmotic Demyelination syndrome. No associated hemorrhage. No significant mass effect. 2. Relatively subtle signal changes also in the bilateral thalamus. But more pronounced in the bilateral  internal capsules and cortical white matter radiations. These are nonspecific but favor also secondary to #1. 3. Generalized decreased T1 marrow signal is nonspecific but might be related to the history of cirrhosis. 4. There is C4-C5 degenerative spinal stenosis related to disc herniation with at least mild spinal cord mass effect. Electronically Signed: By: Genevie Ann M.D. On: 01/10/2021 10:12   US Abdomen Limited  Result Date: 01/10/2021 CLINICAL DATA:  Ascites EXAM: LIMITED ABDOMEN ULTRASOUND FOR ASCITES TECHNIQUE: Limited ultrasound survey for ascites was performed in all four abdominal quadrants. COMPARISON:  09/18/2020 FINDINGS: Trace fluid in the right upper quadrant. No ascites is visualized in the left upper quadrant or bilateral lower pelvis. IMPRESSION: Trace upper abdominal ascites. This would be insufficient for paracentesis. Electronically Signed   By: Julian Hy M.D.   On: 01/10/2021 01:46  Scheduled Meds:  cholecalciferol  1,000 Units Oral QHS   ciprofloxacin  500 mg Oral Q breakfast   ferrous sulfate  325 mg Oral BID WC   insulin aspart  0-5 Units Subcutaneous QHS   insulin aspart  0-9 Units Subcutaneous TID WC   insulin aspart  12 Units Subcutaneous TID WC   insulin glargine-yfgn  20 Units Subcutaneous BID   lactulose  30 g Oral TID   loratadine  10 mg Oral Daily   multivitamin with minerals  1 tablet Oral Daily   pantoprazole  40 mg Oral QAC breakfast   Continuous Infusions:      LOS: 3 days   Time spent: 75mns Greater than 50% of this time was spent in counseling, explanation of diagnosis, planning of further management, and coordination of care.    JGeradine Girt DO [facility]  Available via Epic secure chat 7am-7pm for nonurgent issues Please page for urgent issues To page the attending provider between 7A-7P or the covering provider during after hours 7P-7A, please log into the web site [email or web address] and access using universal [facility] password for that web site. If you do not have the password, please call the hospital operator.    01/11/2021, 12:12 PM

## 2021-01-11 NOTE — Progress Notes (Signed)
Physical Therapy Treatment Patient Details Name: Meagan Peck MRN: [id number] DOB: 07/17/1960 Today's Date: 01/11/2021   History of Present Illness 60 yo female admitted with hepatic encephalopathy, weakness, slurred speech. Hx of NASH, DM, hepatic encephalopathy, cirrhosis. MRI reporing Striking abnormal diffusion, T2 and FLAIR signal abnormality  symmetrically affecting the pons with sparing of the bilateral  cortical spinal tracts.  Favor Osmotic Demyelination syndrome.    PT Comments    Progressing with mobility. Pt remains weak (R side worse than L). Gait is ataxic and she is at risk for falls when mobilizing. She also continues to have dizziness-no nystagmus noted during session. Will continue to progress activity. Recommendation is for CIR.     Recommendations for follow up therapy are one component of a multi-disciplinary discharge planning process, led by the attending physician.  Recommendations may be updated based on patient status, additional functional criteria and insurance authorization.  Follow Up Recommendations  CIR     Equipment Recommendations  Rolling walker with 5" wheels    Recommendations for Other Services OT consult;Rehab consult     Precautions / Restrictions Precautions Precautions: Fall Restrictions Weight Bearing Restrictions: No     Mobility  Bed Mobility Overal bed mobility: Needs Assistance Bed Mobility: Supine to Sit     Supine to sit: Min guard     General bed mobility comments: Increased time. Relies on bedrail to before transfers.    Transfers Overall transfer level: Needs assistance Equipment used: Rolling walker (2 wheeled) Transfers: Sit to/from Stand Sit to Stand: Min assist Stand pivot transfers: Min assist       General transfer comment: Assist to rise, steady, control descent. Wide BOS and unsteady.Cues for safety, technique, hand placement. Stand pivot, bed to recliner, using RW  Ambulation/Gait Ambulation/Gait  assistance: Mod assist Gait Distance (Feet): 5 Feet Assistive device: Rolling walker (2 wheeled) Gait Pattern/deviations: Wide base of support;Step-through pattern     General Gait Details: Assist to stabilize/support pt and to manage RW. Some R LE ataxia observed. Pt remains unsteady and at risk for falls. Ambulation distance limited by dizziness. Walked 5 feet forwards then backwards with RW. Pt reported some dizziness with ambulation on today.   Stairs             Wheelchair Mobility    Modified Rankin (Stroke Patients Only)       Balance Overall balance assessment: Needs assistance         Standing balance support: Bilateral upper extremity supported Standing balance-Leahy Scale: Poor                              Cognition Arousal/Alertness: Awake/alert Behavior During Therapy: WFL for tasks assessed/performed Overall Cognitive Status: Within Functional Limits for tasks assessed                                        Exercises General Exercises - Lower Extremity Long Arc Quad: AROM;Both;10 reps;Seated    General Comments        Pertinent Vitals/Pain Pain Assessment: 0-10 Pain Score: 5  Pain Location: headache Pain Descriptors / Indicators: Aching Pain Intervention(s): Monitored during session;Repositioned    Home Living                      Prior Function  PT Goals (current goals can now be found in the care plan section) Acute Rehab PT Goals Patient Stated Goal: get better and be independent PT Goal Formulation: With patient Time For Goal Achievement: 01/25/21 Potential to Achieve Goals: Good    Frequency    Min 3X/week      PT Plan      Co-evaluation              AM-PAC PT "6 Clicks" Mobility   Outcome Measure  Help needed turning from your back to your side while in a flat bed without using bedrails?: A Little Help needed moving from lying on your back to sitting on the  side of a flat bed without using bedrails?: A Little Help needed moving to and from a bed to a chair (including a wheelchair)?: A Little Help needed standing up from a chair using your arms (e.g., wheelchair or bedside chair)?: A Little Help needed to walk in hospital room?: A Lot Help needed climbing 3-5 steps with a railing? : Total 6 Click Score: 15    End of Session Equipment Utilized During Treatment: Gait belt Activity Tolerance: Patient tolerated treatment well Patient left: in chair;with call bell/phone within reach;with chair alarm set   PT Visit Diagnosis: Muscle weakness (generalized) (M62.81);Difficulty in walking, not elsewhere classified (R26.2)     Time: 4709-6283 PT Time Calculation (min) (ACUTE ONLY): 28 min  Charges:  $Gait Training: 23-37 mins                         Doreatha Massed, PT Acute Rehabilitation  Office: [phone] Pager: [phone]

## 2021-01-12 ENCOUNTER — Other Ambulatory Visit: Payer: Self-pay

## 2021-01-12 ENCOUNTER — Ambulatory Visit (HOSPITAL_COMMUNITY): Admission: RE | Admit: 2021-01-12 | Payer: Self-pay | Source: Ambulatory Visit

## 2021-01-12 ENCOUNTER — Encounter (HOSPITAL_COMMUNITY): Payer: Self-pay | Admitting: Physical Medicine and Rehabilitation

## 2021-01-12 ENCOUNTER — Inpatient Hospital Stay (HOSPITAL_COMMUNITY)
Admission: RE | Admit: 2021-01-12 | Discharge: 2021-02-08 | DRG: 058 | Disposition: A | Discharge status: Hospice | Payer: Medicaid Other | Source: Other Acute Inpatient Hospital | Attending: Physical Medicine and Rehabilitation | Admitting: Physical Medicine and Rehabilitation

## 2021-01-12 ENCOUNTER — Other Ambulatory Visit (HOSPITAL_BASED_OUTPATIENT_CLINIC_OR_DEPARTMENT_OTHER): Payer: Self-pay

## 2021-01-12 DIAGNOSIS — Z882 Allergy status to sulfonamides status: Secondary | ICD-10-CM

## 2021-01-12 DIAGNOSIS — E7220 Disorder of urea cycle metabolism, unspecified: Secondary | ICD-10-CM | POA: Diagnosis not present

## 2021-01-12 DIAGNOSIS — R7401 Elevation of levels of liver transaminase levels: Secondary | ICD-10-CM

## 2021-01-12 DIAGNOSIS — K567 Ileus, unspecified: Secondary | ICD-10-CM | POA: Diagnosis not present

## 2021-01-12 DIAGNOSIS — E1165 Type 2 diabetes mellitus with hyperglycemia: Secondary | ICD-10-CM | POA: Diagnosis present

## 2021-01-12 DIAGNOSIS — K7682 Hepatic encephalopathy: Secondary | ICD-10-CM | POA: Diagnosis present

## 2021-01-12 DIAGNOSIS — E11649 Type 2 diabetes mellitus with hypoglycemia without coma: Secondary | ICD-10-CM | POA: Diagnosis not present

## 2021-01-12 DIAGNOSIS — K7460 Unspecified cirrhosis of liver: Secondary | ICD-10-CM | POA: Diagnosis present

## 2021-01-12 DIAGNOSIS — M19071 Primary osteoarthritis, right ankle and foot: Secondary | ICD-10-CM | POA: Diagnosis present

## 2021-01-12 DIAGNOSIS — E8809 Other disorders of plasma-protein metabolism, not elsewhere classified: Secondary | ICD-10-CM | POA: Diagnosis not present

## 2021-01-12 DIAGNOSIS — I152 Hypertension secondary to endocrine disorders: Secondary | ICD-10-CM | POA: Diagnosis present

## 2021-01-12 DIAGNOSIS — Z79899 Other long term (current) drug therapy: Secondary | ICD-10-CM

## 2021-01-12 DIAGNOSIS — G379 Demyelinating disease of central nervous system, unspecified: Secondary | ICD-10-CM

## 2021-01-12 DIAGNOSIS — G372 Central pontine myelinolysis: Principal | ICD-10-CM | POA: Diagnosis present

## 2021-01-12 DIAGNOSIS — K7581 Nonalcoholic steatohepatitis (NASH): Secondary | ICD-10-CM | POA: Diagnosis present

## 2021-01-12 DIAGNOSIS — R471 Dysarthria and anarthria: Secondary | ICD-10-CM | POA: Diagnosis present

## 2021-01-12 DIAGNOSIS — D649 Anemia, unspecified: Secondary | ICD-10-CM | POA: Diagnosis not present

## 2021-01-12 DIAGNOSIS — M19072 Primary osteoarthritis, left ankle and foot: Secondary | ICD-10-CM | POA: Diagnosis present

## 2021-01-12 DIAGNOSIS — J302 Other seasonal allergic rhinitis: Secondary | ICD-10-CM | POA: Diagnosis present

## 2021-01-12 DIAGNOSIS — I8510 Secondary esophageal varices without bleeding: Secondary | ICD-10-CM | POA: Diagnosis present

## 2021-01-12 DIAGNOSIS — G4700 Insomnia, unspecified: Secondary | ICD-10-CM | POA: Diagnosis present

## 2021-01-12 DIAGNOSIS — Z880 Allergy status to penicillin: Secondary | ICD-10-CM

## 2021-01-12 DIAGNOSIS — Z66 Do not resuscitate: Secondary | ICD-10-CM | POA: Diagnosis not present

## 2021-01-12 DIAGNOSIS — D509 Iron deficiency anemia, unspecified: Secondary | ICD-10-CM | POA: Diagnosis present

## 2021-01-12 DIAGNOSIS — E876 Hypokalemia: Secondary | ICD-10-CM | POA: Diagnosis not present

## 2021-01-12 DIAGNOSIS — R4701 Aphasia: Secondary | ICD-10-CM | POA: Diagnosis present

## 2021-01-12 DIAGNOSIS — G8250 Quadriplegia, unspecified: Secondary | ICD-10-CM | POA: Diagnosis present

## 2021-01-12 DIAGNOSIS — M4802 Spinal stenosis, cervical region: Secondary | ICD-10-CM | POA: Diagnosis present

## 2021-01-12 DIAGNOSIS — Z794 Long term (current) use of insulin: Secondary | ICD-10-CM

## 2021-01-12 DIAGNOSIS — D696 Thrombocytopenia, unspecified: Secondary | ICD-10-CM | POA: Diagnosis not present

## 2021-01-12 DIAGNOSIS — K7290 Hepatic failure, unspecified without coma: Secondary | ICD-10-CM | POA: Diagnosis present

## 2021-01-12 DIAGNOSIS — Z888 Allergy status to other drugs, medicaments and biological substances status: Secondary | ICD-10-CM

## 2021-01-12 DIAGNOSIS — E1169 Type 2 diabetes mellitus with other specified complication: Secondary | ICD-10-CM | POA: Diagnosis present

## 2021-01-12 DIAGNOSIS — T383X6A Underdosing of insulin and oral hypoglycemic [antidiabetic] drugs, initial encounter: Secondary | ICD-10-CM | POA: Diagnosis present

## 2021-01-12 DIAGNOSIS — R4189 Other symptoms and signs involving cognitive functions and awareness: Secondary | ICD-10-CM | POA: Diagnosis not present

## 2021-01-12 DIAGNOSIS — Z5329 Procedure and treatment not carried out because of patient's decision for other reasons: Secondary | ICD-10-CM | POA: Diagnosis not present

## 2021-01-12 DIAGNOSIS — R066 Hiccough: Secondary | ICD-10-CM | POA: Diagnosis not present

## 2021-01-12 DIAGNOSIS — F32A Depression, unspecified: Secondary | ICD-10-CM | POA: Diagnosis not present

## 2021-01-12 DIAGNOSIS — E785 Hyperlipidemia, unspecified: Secondary | ICD-10-CM | POA: Diagnosis present

## 2021-01-12 DIAGNOSIS — Z7189 Other specified counseling: Secondary | ICD-10-CM

## 2021-01-12 DIAGNOSIS — Z886 Allergy status to analgesic agent status: Secondary | ICD-10-CM

## 2021-01-12 DIAGNOSIS — Z9049 Acquired absence of other specified parts of digestive tract: Secondary | ICD-10-CM

## 2021-01-12 DIAGNOSIS — H5461 Unqualified visual loss, right eye, normal vision left eye: Secondary | ICD-10-CM | POA: Diagnosis present

## 2021-01-12 DIAGNOSIS — E46 Unspecified protein-calorie malnutrition: Secondary | ICD-10-CM

## 2021-01-12 DIAGNOSIS — K766 Portal hypertension: Secondary | ICD-10-CM | POA: Diagnosis present

## 2021-01-12 DIAGNOSIS — H4921 Sixth [abducent] nerve palsy, right eye: Secondary | ICD-10-CM | POA: Diagnosis present

## 2021-01-12 DIAGNOSIS — Z8249 Family history of ischemic heart disease and other diseases of the circulatory system: Secondary | ICD-10-CM

## 2021-01-12 DIAGNOSIS — K219 Gastro-esophageal reflux disease without esophagitis: Secondary | ICD-10-CM | POA: Diagnosis present

## 2021-01-12 DIAGNOSIS — Z91120 Patient's intentional underdosing of medication regimen due to financial hardship: Secondary | ICD-10-CM

## 2021-01-12 DIAGNOSIS — M6289 Other specified disorders of muscle: Secondary | ICD-10-CM

## 2021-01-12 DIAGNOSIS — Z833 Family history of diabetes mellitus: Secondary | ICD-10-CM

## 2021-01-12 DIAGNOSIS — K5900 Constipation, unspecified: Secondary | ICD-10-CM | POA: Diagnosis present

## 2021-01-12 DIAGNOSIS — Z792 Long term (current) use of antibiotics: Secondary | ICD-10-CM

## 2021-01-12 LAB — GLUCOSE, CAPILLARY
Glucose-Capillary: 128 mg/dL — ABNORMAL HIGH (ref 70–99)
Glucose-Capillary: 224 mg/dL — ABNORMAL HIGH (ref 70–99)
Glucose-Capillary: 176 mg/dL — ABNORMAL HIGH (ref 70–99)
Glucose-Capillary: 208 mg/dL — ABNORMAL HIGH (ref 70–99)
Glucose-Capillary: 246 mg/dL — ABNORMAL HIGH (ref 70–99)

## 2021-01-12 LAB — COMPREHENSIVE METABOLIC PANEL
ALT: 45 U/L — ABNORMAL HIGH (ref 0–44)
AST: 102 U/L — ABNORMAL HIGH (ref 15–41)
Albumin: 2.9 g/dL — ABNORMAL LOW (ref 3.5–5.0)
Alkaline Phosphatase: 120 U/L (ref 38–126)
Anion gap: 14 (ref 5–15)
BUN: 14 mg/dL (ref 6–20)
CO2: 19 mmol/L — ABNORMAL LOW (ref 22–32)
Calcium: 9.7 mg/dL (ref 8.9–10.3)
Chloride: 111 mmol/L (ref 98–111)
Creatinine, Ser: 0.69 mg/dL (ref 0.44–1.00)
GFR, Estimated: >60 mL/min (ref >60)
Glucose, Bld: 124 mg/dL — ABNORMAL HIGH (ref 70–99)
Potassium: 3.7 mmol/L (ref 3.5–5.1)
Sodium: 144 mmol/L (ref 135–145)
Total Bilirubin: 6.1 mg/dL — ABNORMAL HIGH (ref 0.3–1.2)
Total Protein: 5.7 g/dL — ABNORMAL LOW (ref 6.5–8.1)

## 2021-01-12 LAB — MAGNESIUM: Magnesium: 1.4 mg/dL — ABNORMAL LOW (ref 1.7–2.4)

## 2021-01-12 MED ORDER — INSULIN ASPART 100 UNIT/ML IJ SOLN: 12.0000 [IU] | Freq: Three times a day (TID) | INTRAMUSCULAR | Status: DC

## 2021-01-12 MED ORDER — MAGNESIUM SULFATE 4 GM/100ML IV SOLN
4.0000 g | Freq: Once | INTRAVENOUS | Status: DC
  Administered 2021-01-12: 4 g via INTRAVENOUS
  Filled 2021-01-12: qty 100

## 2021-01-12 MED ORDER — ONDANSETRON HCL 4 MG PO TABS: 4.0000 mg | ORAL_TABLET | Freq: Four times a day (QID) | ORAL | Status: DC | PRN

## 2021-01-12 MED ORDER — LORATADINE 10 MG PO TABS
10.0000 mg | ORAL_TABLET | Freq: Every day | ORAL | Status: DC
  Administered 2021-01-13 – 2021-02-08 (×27): 10 mg via ORAL
  Filled 2021-01-12 (×28): qty 1

## 2021-01-12 MED ORDER — FERROUS SULFATE 325 (65 FE) MG PO TABS
325.0000 mg | ORAL_TABLET | Freq: Two times a day (BID) | ORAL | Status: DC
  Administered 2021-01-12 – 2021-02-03 (×45): 325 mg via ORAL
  Filled 2021-01-12 (×47): qty 1

## 2021-01-12 MED ORDER — ONDANSETRON HCL 4 MG/2ML IJ SOLN: 4.0000 mg | Freq: Four times a day (QID) | INTRAMUSCULAR | Status: DC | PRN

## 2021-01-12 MED ORDER — ADULT MULTIVITAMIN W/MINERALS CH
1.0000 | ORAL_TABLET | Freq: Every day | ORAL | Status: DC
  Administered 2021-01-13 – 2021-02-08 (×27): 1 via ORAL
  Filled 2021-01-12 (×28): qty 1

## 2021-01-12 MED ORDER — INSULIN ASPART 100 UNIT/ML IJ SOLN: 0.0000 [IU] | Freq: Three times a day (TID) | INTRAMUSCULAR | Status: DC

## 2021-01-12 MED ORDER — INSULIN ASPART 100 UNIT/ML IJ SOLN: 0.0000 [IU] | Freq: Every day | INTRAMUSCULAR | 11 refills | Status: DC

## 2021-01-12 MED ORDER — INSULIN ASPART 100 UNIT/ML IJ SOLN
12.0000 [IU] | Freq: Three times a day (TID) | INTRAMUSCULAR | Status: DC
  Administered 2021-01-12 – 2021-01-15 (×8): 12 [IU] via SUBCUTANEOUS

## 2021-01-12 MED ORDER — INSULIN GLARGINE-YFGN 100 UNIT/ML ~~LOC~~ SOLN
20.0000 [IU] | Freq: Two times a day (BID) | SUBCUTANEOUS | Status: DC
  Administered 2021-01-12 – 2021-01-25 (×26): 20 [IU] via SUBCUTANEOUS
  Filled 2021-01-12 (×27): qty 0.2

## 2021-01-12 MED ORDER — LACTULOSE 10 GM/15ML PO SOLN
30.0000 g | Freq: Three times a day (TID) | ORAL | Status: DC
  Administered 2021-01-12 – 2021-01-22 (×30): 30 g via ORAL
  Filled 2021-01-12 (×31): qty 45

## 2021-01-12 MED ORDER — CIPROFLOXACIN HCL 250 MG PO TABS
500.0000 mg | ORAL_TABLET | Freq: Every day | ORAL | Status: DC
  Administered 2021-01-13 – 2021-02-07 (×26): 500 mg via ORAL
  Filled 2021-01-12 (×26): qty 2

## 2021-01-12 MED ORDER — LACTULOSE 10 GM/15ML PO SOLN
30.0000 g | Freq: Three times a day (TID) | ORAL | 0 refills | Status: DC
  Filled 2021-01-12: qty 237, 2d supply, fill #0

## 2021-01-12 MED ORDER — MAGNESIUM SULFATE 2 GM/50ML IV SOLN: 2.0000 g | Freq: Once | INTRAVENOUS | Status: DC

## 2021-01-12 MED ORDER — INSULIN ASPART 100 UNIT/ML IJ SOLN: 12.0000 [IU] | Freq: Three times a day (TID) | INTRAMUSCULAR | 11 refills | Status: DC

## 2021-01-12 MED ORDER — ACETAMINOPHEN 500 MG PO TABS
500.0000 mg | ORAL_TABLET | Freq: Four times a day (QID) | ORAL | Status: DC | PRN
  Administered 2021-01-13 – 2021-01-19 (×6): 1000 mg via ORAL
  Filled 2021-01-12 (×6): qty 2

## 2021-01-12 MED ORDER — INSULIN ASPART 100 UNIT/ML IJ SOLN
0.0000 [IU] | Freq: Three times a day (TID) | INTRAMUSCULAR | Status: DC
  Administered 2021-01-12: 3 [IU] via SUBCUTANEOUS
  Administered 2021-01-12: 2 [IU] via SUBCUTANEOUS
  Administered 2021-01-13 (×3): 3 [IU] via SUBCUTANEOUS
  Administered 2021-01-14: 1 [IU] via SUBCUTANEOUS
  Administered 2021-01-14: 5 [IU] via SUBCUTANEOUS
  Administered 2021-01-14: 3 [IU] via SUBCUTANEOUS
  Administered 2021-01-15: 2 [IU] via SUBCUTANEOUS
  Administered 2021-01-15: 3 [IU] via SUBCUTANEOUS
  Administered 2021-01-15: 2 [IU] via SUBCUTANEOUS
  Administered 2021-01-16: 5 [IU] via SUBCUTANEOUS
  Administered 2021-01-16: 2 [IU] via SUBCUTANEOUS
  Administered 2021-01-17: 3 [IU] via SUBCUTANEOUS
  Administered 2021-01-17 – 2021-01-18 (×3): 2 [IU] via SUBCUTANEOUS
  Administered 2021-01-18: 3 [IU] via SUBCUTANEOUS
  Administered 2021-01-19 (×2): 2 [IU] via SUBCUTANEOUS
  Administered 2021-01-20 (×2): 3 [IU] via SUBCUTANEOUS
  Administered 2021-01-21: 1 [IU] via SUBCUTANEOUS
  Administered 2021-01-21: 2 [IU] via SUBCUTANEOUS
  Administered 2021-01-22: 1 [IU] via SUBCUTANEOUS
  Administered 2021-01-22: 2 [IU] via SUBCUTANEOUS
  Administered 2021-01-23: 1 [IU] via SUBCUTANEOUS
  Administered 2021-01-23: 2 [IU] via SUBCUTANEOUS
  Administered 2021-01-23: 1 [IU] via SUBCUTANEOUS
  Administered 2021-01-24 (×2): 2 [IU] via SUBCUTANEOUS
  Administered 2021-01-24: 3 [IU] via SUBCUTANEOUS
  Administered 2021-01-25 (×2): 2 [IU] via SUBCUTANEOUS
  Administered 2021-01-26: 1 [IU] via SUBCUTANEOUS
  Administered 2021-01-26: 2 [IU] via SUBCUTANEOUS
  Administered 2021-01-27: 1 [IU] via SUBCUTANEOUS
  Administered 2021-01-27 – 2021-01-28 (×2): 2 [IU] via SUBCUTANEOUS
  Administered 2021-01-28 – 2021-01-29 (×3): 1 [IU] via SUBCUTANEOUS
  Administered 2021-01-30 (×2): 2 [IU] via SUBCUTANEOUS
  Administered 2021-01-31: 3 [IU] via SUBCUTANEOUS
  Administered 2021-01-31 – 2021-02-01 (×2): 1 [IU] via SUBCUTANEOUS
  Administered 2021-02-01: 2 [IU] via SUBCUTANEOUS
  Administered 2021-02-02: 3 [IU] via SUBCUTANEOUS
  Administered 2021-02-02: 1 [IU] via SUBCUTANEOUS
  Administered 2021-02-03: 2 [IU] via SUBCUTANEOUS
  Administered 2021-02-03: 3 [IU] via SUBCUTANEOUS
  Administered 2021-02-04: 2 [IU] via SUBCUTANEOUS
  Administered 2021-02-04: 1 [IU] via SUBCUTANEOUS
  Administered 2021-02-04: 2 [IU] via SUBCUTANEOUS
  Administered 2021-02-05 – 2021-02-07 (×4): 1 [IU] via SUBCUTANEOUS

## 2021-01-12 MED ORDER — INSULIN GLARGINE-YFGN 100 UNIT/ML ~~LOC~~ SOLN: 20.0000 [IU] | Freq: Two times a day (BID) | SUBCUTANEOUS | 11 refills | Status: DC

## 2021-01-12 MED ORDER — VITAMIN D 25 MCG (1000 UNIT) PO TABS
1000.0000 [IU] | ORAL_TABLET | Freq: Every day | ORAL | Status: DC
  Administered 2021-01-12 – 2021-02-07 (×27): 1000 [IU] via ORAL
  Filled 2021-01-12 (×27): qty 1

## 2021-01-12 MED ORDER — INSULIN ASPART 100 UNIT/ML IJ SOLN: 0.0000 [IU] | Freq: Three times a day (TID) | INTRAMUSCULAR | 11 refills | Status: DC

## 2021-01-12 MED ORDER — PANTOPRAZOLE SODIUM 40 MG PO TBEC
40.0000 mg | DELAYED_RELEASE_TABLET | Freq: Every day | ORAL | Status: DC
  Administered 2021-01-13 – 2021-02-08 (×27): 40 mg via ORAL
  Filled 2021-01-12 (×26): qty 1

## 2021-01-12 NOTE — Progress Notes (Addendum)
Inpatient Rehab Admissions Coordinator:   Spoke to patient over the phone to discuss CIR goals and expectations.  I think she would be an excellent candidate.  Supervision goals, estimated length of stay probably 14-16 days.  I've left a message for her spouse to answer questions.  I reviewed cost of rehab with her and have asked that she been evaluated by [employer] for Medicaid.  I could potentially admit today, and will know by 10 AM if I have a bed.    1012: I have a bed for this patient to admit today and Dr. Eliseo Squires in agreement. I've let pt and TOC team know.  Awaiting call back from pt's spouse, but she will let them know about transport too.   Shann Medal, PT, DPT Admissions Coordinator [phone] 01/12/21  8:51 AM

## 2021-01-12 NOTE — Progress Notes (Signed)
PMR Admission Coordinator Pre-Admission Assessment   Patient: Meagan Peck is an 60 y.o., female MRN: [id number] DOB: 1961/03/31 Height: 4' 10"  (147.3 cm) Weight: 59 kg   Insurance Information HMO:     PPO:      PCP:      IPA:      80/20:      OTHER:  PRIMARY: Uninsured      Policy#:       Subscriber:  CM Name:       Phone#:      Fax#:  Pre-Cert#:       Employer:  Benefits:  Phone #:      Name:  Eff. Date:      Deduct:       Out of Pocket Max:       Life Max:  CIR:       SNF:  Outpatient:      Co-Pay:  Home Health:       Co-Pay:  DME:      Co-Pay:  Providers:  SECONDARY:       Policy#:      Phone#:    Development worker, community:       Phone#:    The Therapist, art Information Summary" for patients in Inpatient Rehabilitation Facilities with attached "Privacy Act [place] Records" was provided and verbally reviewed with: N/A   Emergency Contact Information Contact Information       Name Relation Home Work Mobile    Forbess,Jody Spouse [phone]   [phone]    Dali, Kraner Daughter [phone]   [phone]           Current Medical History  Patient Admitting Diagnosis: osmotic demyelination syndrome   History of Present Illness: Meagan Peck is a 60 year old right-handed female with history significant of [name] cirrhosis with previous hepatic encephalopathy maintained on chronic Cipro, diabetes mellitus hypertension hyperlipidemia, esophageal varices/GERD.   Presented 01/08/2021 altered mental status and slurred speech.  Denied any chills or fever.  Admission chemistries unremarkable except glucose 348, AST 54, ammonia level 83, hemoglobin A1c 10.7, hemoglobin 9.2, urinalysis negative nitrite.  Cranial CT scan negative for acute changes.  MRI showed striking abnormal diffusion, T2 and flair signal abnormality symmetrically affecting the pons with sparing of the bilateral cortical infarcts favoring osmotic demyelination syndrome.  Relatively subtle signal  changes also in the bilateral thalamus felt to be nonspecific.  Ultrasound the abdomen showed trace upper abdominal ascites and no plan for paracentesis.  MRI cervical spine degenerative changes moderate to severe spinal stenosis C4-5 and moderate C5-6 and C6-7.  There was some mass-effect on the cord at these levels worse at C4-5 without cord signal abnormality.  Latest ammonia level 139 continues on scheduled Chronulac.  Therapy evaluations completed due to patient decreased functional mobility was recommended for a comprehensive rehab program.   Complete NIHSS TOTAL: 1   [name] medical record from [facility] has been reviewed by the rehabilitation admission coordinator and physician.   Past Medical History      Past Medical History:  Diagnosis Date   Acute medial meniscus tear of right knee 12/10/2015   Allergy     Anasarca     Arthritis      bil feet   Ascites     Cataract      bilateral - surgery to remove   Cirrhosis ([place])     Diabetes mellitus without complication ([place])      type 2 - last a1c was 5.2 per patient, no  meds   Dyspnea      when I have too much fluid   Esophageal varices (HCC)     GERD (gastroesophageal reflux disease)     Heart murmur     Hepatic cirrhosis ([facility]CC)     HLD (hyperlipidemia)      no meds   Hypertension      no meds   Iron deficiency anemia 09/26/2018      Has the patient had major surgery during 100 days prior to admission? No   Family History   family history includes Alcoholism in her father, maternal uncle, and paternal uncle; COPD in her mother; Diabetes in her brother and paternal grandmother; Heart attack in her father; Heart disease in her maternal grandfather, maternal grandmother, paternal grandfather, and paternal grandmother; Hypertension in her mother.   Current Medications   Current Facility-Administered Medications:    acetaminophen (TYLENOL) tablet 500-1,000 mg, 500-1,000 mg, Oral, Q6H PRN, Gala Romney L, MD, 1,000 mg at  01/12/21 8676   cholecalciferol (VITAMIN D3) tablet 1,000 Units, 1,000 Units, Oral, QHS, Gala Romney L, MD, 1,000 Units at 01/11/21 2239   ciprofloxacin (CIPRO) tablet 500 mg, 500 mg, Oral, Q breakfast, [facility], Mohammad L, MD, 500 mg at 01/12/21 0804   ferrous sulfate tablet 325 mg, 325 mg, Oral, BID WC, Garba, Mohammad L, MD, 325 mg at 01/12/21 0803   insulin aspart (novoLOG) injection 0-5 Units, 0-5 Units, Subcutaneous, QHS, Gala Romney L, MD, 3 Units at 01/11/21 2238   insulin aspart (novoLOG) injection 0-9 Units, 0-9 Units, Subcutaneous, TID WC, Gala Romney L, MD, 1 Units at 01/12/21 0818   insulin aspart (novoLOG) injection 12 Units, 12 Units, Subcutaneous, TID WC, Florencia Reasons, MD, 12 Units at 01/12/21 0817   insulin glargine-yfgn (SEMGLEE) injection 20 Units, 20 Units, Subcutaneous, BID, Florencia Reasons, MD, 20 Units at 01/11/21 2238   lactulose ([place]) 10 GM/15ML solution 30 g, 30 g, Oral, TID, Florencia Reasons, MD, 30 g at 01/11/21 2238   loratadine (CLARITIN) tablet 10 mg, 10 mg, Oral, Daily, [facility], Mohammad L, MD, 10 mg at 01/09/21 7209   magnesium sulfate IVPB 4 g 100 mL, 4 g, Intravenous, Once, Vann, Jessica U, DO   multivitamin with minerals tablet 1 tablet, 1 tablet, Oral, Daily, [facility], Mohammad L, MD, 1 tablet at 01/11/21 0910   ondansetron (ZOFRAN) tablet 4 mg, 4 mg, Oral, Q6H PRN **OR** ondansetron (ZOFRAN) injection 4 mg, 4 mg, Intravenous, Q6H PRN, [facility], Mohammad L, MD   pantoprazole (PROTONIX) EC tablet 40 mg, 40 mg, Oral, QAC breakfast, Gala Romney L, MD, 40 mg at 01/12/21 0804   Patients Current Diet:  Diet Order                  Diet Carb Modified             Diet Carb Modified Fluid consistency: Thin; Room service appropriate? Yes  Diet effective now                         Precautions / Restrictions Precautions Precautions: Fall Precaution Comments: RLE weankess Restrictions Weight Bearing Restrictions: No    Has the patient had 2 or more falls or a  fall with injury in the past year? No   Prior Activity Level Limited Community (1-2x/wk): went out on Tuesday/Thursday, needing some help with bathing, but mod I with SPC otherwise.  Not driving.  Not working since March.   Prior Functional Level Self Care: Did  the patient need help bathing, dressing, using the toilet or eating? Needed some help with bathing   Indoor Mobility: Did the patient need assistance with walking from room to room (with or without device)? Independent   Stairs: Did the patient need assistance with internal or external stairs (with or without device)? Needed some help   Functional Cognition: Did the patient need help planning regular tasks such as shopping or remembering to take medications? Independent   Patient Information Are you of Hispanic, Latino/a,or Spanish origin?: A. No, not of Hispanic, Latino/a, or Spanish origin What is your race?: A. White Do you need or want an interpreter to communicate with a doctor or health care staff?: 0. No   [name] Response To:  Health Literacy and Transportation Is the patient able to respond to health literacy and transportation needs?: Yes Health Literacy - How often do you need to have someone help you when you read instructions, pamphlets, or other written material from your doctor or pharmacy?: Never In the past 12 months, has lack of transportation kept you from medical appointments or from getting medications?: No In the past 12 months, has lack of transportation kept you from meetings, work, or from getting things needed for daily living?: No   Development worker, international aid / [place] Devices/Equipment: Eyeglasses, Radio producer (specify quad or straight) Home Equipment: Cane - single point   Prior Device Use: Indicate devices/aids used by the patient prior to current illness, exacerbation or injury?  Single point cane   Current Functional Level Cognition   Overall Cognitive Status: Within Functional Limits  for tasks assessed Orientation Level: Oriented X4 General Comments: Pt crying upon arrival, upset regarding difficulty eating breakfast and self feeding, easy to calm and encourage. Pt momentarily upset regarding current mobility status, responds well to encouragement.    Extremity Assessment (includes Sensation/Coordination)   Upper Extremity Assessment: RUE deficits/detail, LUE deficits/detail RUE Deficits / Details: 4-/5 shoulder strength, 4/5 bicep strength, 4+/5 tricep strength, 4+/5 wrist, 4/5 grip RUE Sensation: WNL RUE Coordination: decreased fine motor, decreased gross motor (bradykinesia with finger to thumb, mild ataxia and bradykinesia with finger to nose) LUE Deficits / Details: WFL ROM, 4+/5 strength LUE Sensation: WNL LUE Coordination: WNL  Lower Extremity Assessment: Defer to PT evaluation     ADLs   Overall ADL's : Needs assistance/impaired Eating/Feeding: Set up, Sitting Eating/Feeding Details (indicate cue type and reason): with difficulty due to impaired coordination Grooming: Set up, Sitting Upper Body Bathing: Set up, Sitting Lower Body Bathing: Moderate assistance, Sit to/from stand Upper Body Dressing : Set up, Sitting Upper Body Dressing Details (indicate cue type and reason): shirt without fasteners Lower Body Dressing: Sit to/from stand, Maximal assistance Toilet Transfer: Minimal assistance, BSC, Ambulation, RW Toileting- Clothing Manipulation and Hygiene: Sit to/from stand, Moderate assistance Toileting - Clothing Manipulation Details (indicate cue type and reason): patient assisted with clothing management but limited by poor balance and needing one arm on walker, patient able to wipe Tub/ Shower Transfer: Minimal assistance, Shower seat Functional mobility during ADLs: Minimal assistance, Rolling walker     Mobility   Overal bed mobility: Needs Assistance Bed Mobility: Supine to Sit Supine to sit: Min guard, HOB elevated Sit to supine: Min guard, HOB  elevated General bed mobility comments: min guard with HOB elevated, heavy use of bedrail to upright trunk and VCs to scoot out to EOB to place feet on floor     Transfers   Overall transfer level: Needs assistance Equipment used: Rolling  walker (2 wheeled), 2 person hand held assist Transfers: Sit to/from Stand, Stand Pivot Transfers Sit to Stand: Min assist Stand pivot transfers: Min assist General transfer comment: VCs for hand placement, BLE braced against bed and min A to steady while powering up to stand, min A to steady with pivot over to recliner with bil HHA, VCs for sequencing     Ambulation / Gait / Stairs / Wheelchair Mobility   Ambulation/Gait Ambulation/Gait assistance: Mod assist Gait Distance (Feet): 10 Feet Assistive device: Rolling walker (2 wheeled) Gait Pattern/deviations: Step-to pattern, Decreased stride length, Narrow base of support General Gait Details: ataxic like step progression, maintains narrow BOS with occasional brushing of one leg against the other, VCs and min A to maneuver RW at safe distance, denies dizziness but limited by fatigue requiring seated rest in recliner     Posture / Balance Dynamic Sitting Balance Sitting balance - Comments: able to bend over Balance Overall balance assessment: Needs assistance Sitting-balance support: No upper extremity supported, Feet unsupported Sitting balance-Leahy Scale: Good Sitting balance - Comments: able to bend over Standing balance support: Bilateral upper extremity supported Standing balance-Leahy Scale: Poor     Special needs/care consideration Diabetic management yes    Previous Home Environment (from acute therapy documentation) Living Arrangements: Spouse/significant other, Children Available Help at Discharge: Family, Available 24 hours/day Type of Home: House Home Layout: Able to live on main level with bedroom/bathroom Home Access: Stairs to enter Bathroom Shower/Tub: Print production planner: Standard Home Care Services: No   Discharge Living Setting Plans for Discharge Living Setting: [name] home, Lives with (comment) (spouse and daughter) Type of Home at Discharge: House Discharge Home Layout: One level Discharge Home Access: Stairs to enter Entrance Stairs-Rails: None Entrance Stairs-Number of Steps: 3-4 Discharge Bathroom Shower/Tub: Tub/shower unit Discharge Bathroom Toilet: Standard Discharge Bathroom Accessibility: Yes How Accessible: Accessible via walker Does the patient have any problems obtaining your medications?: Yes (Describe) (uninsured, has had trouble getting insulin)   Social/Family/Support Systems Patient Roles: Spouse, Parent Contact Information: daughter, Luetta Nutting, is 13 Anticipated Caregiver: Jeral Fruit (spouse) [phone]; Luetta Nutting [phone] Anticipated Caregiver's Contact Information: see above Ability/Limitations of Caregiver: supervision Caregiver Availability: 24/7 Discharge Plan Discussed with Primary Caregiver: Yes Is Caregiver In Agreement with Plan?: Yes Does Caregiver/Family have Issues with Lodging/Transportation while Pt is in Rehab?: No   Goals Patient/Family Goal for Rehab: PT/OT supervision to mod I, SLP mod I Expected length of stay: 14-16 days Pt/Family Agrees to Admission and willing to participate: Yes Program Orientation Provided & Reviewed with Pt/Caregiver Including Roles  & Responsibilities: Yes  Barriers to Discharge: Insurance for SNF coverage, Home environment access/layout   Decrease burden of Care through IP rehab admission: n/a   Possible need for SNF placement upon discharge: Not anticipated   Patient Condition: I have reviewed medical records from [facility], spoken with CM, and patient. I discussed via phone for inpatient rehabilitation assessment.  Patient will benefit from ongoing PT, OT, and SLP, can actively participate in 3 hours of therapy a day 5 days of the week, and can make measurable gains during  the admission.  Patient will also benefit from the coordinated team approach during an Inpatient Acute Rehabilitation admission.  The patient will receive intensive therapy as well as Rehabilitation physician, nursing, social worker, and care management interventions.  Due to safety, disease management, medication administration, pain management, and patient education the patient requires 24 hour a day rehabilitation nursing.  The patient is currently mod assist with mobility  and basic ADLs.  Discharge setting and therapy post discharge at home with home health is anticipated.  Patient has agreed to participate in the Acute Inpatient Rehabilitation Program and will admit today.   Preadmission Screen Completed By:  Michel Santee, PT, DPT 01/12/2021 10:15 AM ______________________________________________________________________   Discussed status with Dr. Dagoberto Ligas on 01/12/21 at 10:22 AM  and received approval for admission today.   Admission Coordinator:  Michel Santee, PT, DPT time 10:22 AM Sudie Grumbling 01/12/21     Assessment/Plan: Diagnosis: Does the need for close, 24 hr/day Medical supervision in concert with the patient's rehab needs make it unreasonable for this patient to be served in a less intensive setting? Yes Co-Morbidities requiring supervision/potential complications: uncontrolled DM- A1c 10.7; osmotic demyelination syndrome; NASH; cirrhosis and esophageal varices; hepatic encephalopathy; weakness, dysarthria Due to bladder management, bowel management, safety, skin/wound care, disease management, medication administration, pain management, and patient education, does the patient require 24 hr/day rehab nursing? Yes Does the patient require coordinated care of a physician, rehab nurse, PT, OT, and SLP to address physical and functional deficits in the context of the above medical diagnosis(es)? Yes Addressing deficits in the following areas: balance, endurance, locomotion, strength,  transferring, bowel/bladder control, bathing, dressing, feeding, grooming, toileting, and speech Can the patient actively participate in an intensive therapy program of at least 3 hrs of therapy 5 days a week? Yes The potential for patient to make measurable gains while on inpatient rehab is good and fair Anticipated functional outcomes upon discharge from inpatient rehab: modified independent and supervision PT, modified independent and supervision OT, modified independent and supervision SLP Estimated rehab length of stay to reach the above functional goals is: 14-16 days Anticipated discharge destination: Home 10. Overall Rehab/Functional Prognosis: good

## 2021-01-12 NOTE — TOC Transition Note (Signed)
Transition of Care [facility]) - CM/SW Discharge Note   Patient Details  Name: Meagan Peck MRN: [id number] Date of Birth: [birth year]  [facility]) CM/SW Contact:  Dessa Phi, RN Phone Number: 01/12/2021, 9:57 AM   Clinical Narrative:  CIR following for admission-Nsg will manage [facility]. No further CM needs.     Final next level of care: IP Rehab Facility Barriers to Discharge: No Barriers Identified   Patient Goals and CMS Choice        Discharge Placement                       Discharge Plan and Services                                     Social Determinants of Health (SDOH) Interventions     Readmission Risk Interventions No flowsheet data found.

## 2021-01-12 NOTE — PMR Pre-admission (Signed)
PMR Admission Coordinator Pre-Admission Assessment  Patient: Meagan Peck is an 60 y.o., female MRN: [id number] DOB: 1960/09/12 Height: 4' 10"  (147.3 cm) Weight: 59 kg  Insurance Information HMO:     PPO:      PCP:      IPA:      80/20:      OTHER:  PRIMARY: Uninsured      Policy#:       Subscriber:  CM Name:       Phone#:      Fax#:  Pre-Cert#:       Employer:  Benefits:  Phone #:      Name:  Eff. Date:      Deduct:       Out of Pocket Max:       Life Max:  CIR:       SNF:  Outpatient:      Co-Pay:  Home Health:       Co-Pay:  DME:      Co-Pay:  Providers:  SECONDARY:       Policy#:      Phone#:   Development worker, community:       Phone#:   The Therapist, art Information Summary" for patients in Inpatient Rehabilitation Facilities with attached "Privacy Act [place] Records" was provided and verbally reviewed with: N/A  Emergency Contact Information Contact Information     Name Relation Home Work Mobile   Jerez,Jody Spouse [phone]  [phone]   Deloma, Spindle Daughter [phone]  [phone]       Current Medical History  Patient Admitting Diagnosis: osmotic demyelination syndrome  History of Present Illness: Meagan Peck is a 60 year old right-handed female with history significant of [name] cirrhosis with previous hepatic encephalopathy maintained on chronic Cipro, diabetes mellitus hypertension hyperlipidemia, esophageal varices/GERD.   Presented 01/08/2021 altered mental status and slurred speech.  Denied any chills or fever.  Admission chemistries unremarkable except glucose 348, AST 54, ammonia level 83, hemoglobin A1c 10.7, hemoglobin 9.2, urinalysis negative nitrite.  Cranial CT scan negative for acute changes.  MRI showed striking abnormal diffusion, T2 and flair signal abnormality symmetrically affecting the pons with sparing of the bilateral cortical infarcts favoring osmotic demyelination syndrome.  Relatively subtle signal changes also in  the bilateral thalamus felt to be nonspecific.  Ultrasound the abdomen showed trace upper abdominal ascites and no plan for paracentesis.  MRI cervical spine degenerative changes moderate to severe spinal stenosis C4-5 and moderate C5-6 and C6-7.  There was some mass-effect on the cord at these levels worse at C4-5 without cord signal abnormality.  Latest ammonia level 139 continues on scheduled Chronulac.  Therapy evaluations completed due to patient decreased functional mobility was recommended for a comprehensive rehab program.  Complete NIHSS TOTAL: 1  [name] medical record from [facility] has been reviewed by the rehabilitation admission coordinator and physician.  Past Medical History  Past Medical History:  Diagnosis Date   Acute medial meniscus tear of right knee 12/10/2015   Allergy    Anasarca    Arthritis    bil feet   Ascites    Cataract    bilateral - surgery to remove   Cirrhosis ([place])    Diabetes mellitus without complication ([place])    type 2 - last a1c was 5.2 per patient, no meds   Dyspnea    when I have too much fluid   Esophageal varices ([facility])    GERD (gastroesophageal reflux disease)    Heart murmur  Hepatic cirrhosis ([facility])    HLD (hyperlipidemia)    no meds   Hypertension    no meds   Iron deficiency anemia 09/26/2018    Has the patient had major surgery during 100 days prior to admission? No  Family History   family history includes Alcoholism in her father, maternal uncle, and paternal uncle; COPD in her mother; Diabetes in her brother and paternal grandmother; Heart attack in her father; Heart disease in her maternal grandfather, maternal grandmother, paternal grandfather, and paternal grandmother; Hypertension in her mother.  Current Medications  Current Facility-Administered Medications:    acetaminophen (TYLENOL) tablet 500-1,000 mg, 500-1,000 mg, Oral, Q6H PRN, Gala Romney L, MD, 1,000 mg at 01/12/21 4163   cholecalciferol (VITAMIN D3)  tablet 1,000 Units, 1,000 Units, Oral, QHS, Gala Romney L, MD, 1,000 Units at 01/11/21 2239   ciprofloxacin (CIPRO) tablet 500 mg, 500 mg, Oral, Q breakfast, [facility], Mohammad L, MD, 500 mg at 01/12/21 0804   ferrous sulfate tablet 325 mg, 325 mg, Oral, BID WC, Garba, Mohammad L, MD, 325 mg at 01/12/21 0803   insulin aspart (novoLOG) injection 0-5 Units, 0-5 Units, Subcutaneous, QHS, Gala Romney L, MD, 3 Units at 01/11/21 2238   insulin aspart (novoLOG) injection 0-9 Units, 0-9 Units, Subcutaneous, TID WC, Gala Romney L, MD, 1 Units at 01/12/21 0818   insulin aspart (novoLOG) injection 12 Units, 12 Units, Subcutaneous, TID WC, Florencia Reasons, MD, 12 Units at 01/12/21 0817   insulin glargine-yfgn (SEMGLEE) injection 20 Units, 20 Units, Subcutaneous, BID, Florencia Reasons, MD, 20 Units at 01/11/21 2238   lactulose ([place]) 10 GM/15ML solution 30 g, 30 g, Oral, TID, Florencia Reasons, MD, 30 g at 01/11/21 2238   loratadine (CLARITIN) tablet 10 mg, 10 mg, Oral, Daily, [facility], Mohammad L, MD, 10 mg at 01/09/21 8453   magnesium sulfate IVPB 4 g 100 mL, 4 g, Intravenous, Once, Vann, Jessica U, DO   multivitamin with minerals tablet 1 tablet, 1 tablet, Oral, Daily, [facility], Mohammad L, MD, 1 tablet at 01/11/21 0910   ondansetron (ZOFRAN) tablet 4 mg, 4 mg, Oral, Q6H PRN **OR** ondansetron (ZOFRAN) injection 4 mg, 4 mg, Intravenous, Q6H PRN, [facility], Mohammad L, MD   pantoprazole (PROTONIX) EC tablet 40 mg, 40 mg, Oral, QAC breakfast, Gala Romney L, MD, 40 mg at 01/12/21 0804  Patients Current Diet:  Diet Order             Diet Carb Modified           Diet Carb Modified Fluid consistency: Thin; Room service appropriate? Yes  Diet effective now                   Precautions / Restrictions Precautions Precautions: Fall Precaution Comments: RLE weankess Restrictions Weight Bearing Restrictions: No   Has the patient had 2 or more falls or a fall with injury in the past year? No  Prior Activity  Level Limited Community (1-2x/wk): went out on Tuesday/Thursday, needing some help with bathing, but mod I with SPC otherwise.  Not driving.  Not working since March.  Prior Functional Level Self Care: Did the patient need help bathing, dressing, using the toilet or eating? Needed some help with bathing  Indoor Mobility: Did the patient need assistance with walking from room to room (with or without device)? Independent  Stairs: Did the patient need assistance with internal or external stairs (with or without device)? Needed some help  Functional Cognition: Did the patient need help planning regular tasks  such as shopping or remembering to take medications? Independent  Patient Information Are you of Hispanic, Latino/a,or Spanish origin?: A. No, not of Hispanic, Latino/a, or Spanish origin What is your race?: A. White Do you need or want an interpreter to communicate with a doctor or health care staff?: 0. No  [name] Response To:  Health Literacy and Transportation Is the patient able to respond to health literacy and transportation needs?: Yes Health Literacy - How often do you need to have someone help you when you read instructions, pamphlets, or other written material from your doctor or pharmacy?: Never In the past 12 months, has lack of transportation kept you from medical appointments or from getting medications?: No In the past 12 months, has lack of transportation kept you from meetings, work, or from getting things needed for daily living?: No  Development worker, international aid / [place] Devices/Equipment: Eyeglasses, Radio producer (specify quad or straight) Home Equipment: Cane - single point  Prior Device Use: Indicate devices/aids used by the patient prior to current illness, exacerbation or injury?  Single point cane  Current Functional Level Cognition  Overall Cognitive Status: Within Functional Limits for tasks assessed Orientation Level: Oriented X4 General  Comments: Pt crying upon arrival, upset regarding difficulty eating breakfast and self feeding, easy to calm and encourage. Pt momentarily upset regarding current mobility status, responds well to encouragement.    Extremity Assessment (includes Sensation/Coordination)  Upper Extremity Assessment: RUE deficits/detail, LUE deficits/detail RUE Deficits / Details: 4-/5 shoulder strength, 4/5 bicep strength, 4+/5 tricep strength, 4+/5 wrist, 4/5 grip RUE Sensation: WNL RUE Coordination: decreased fine motor, decreased gross motor (bradykinesia with finger to thumb, mild ataxia and bradykinesia with finger to nose) LUE Deficits / Details: WFL ROM, 4+/5 strength LUE Sensation: WNL LUE Coordination: WNL  Lower Extremity Assessment: Defer to PT evaluation    ADLs  Overall ADL's : Needs assistance/impaired Eating/Feeding: Set up, Sitting Eating/Feeding Details (indicate cue type and reason): with difficulty due to impaired coordination Grooming: Set up, Sitting Upper Body Bathing: Set up, Sitting Lower Body Bathing: Moderate assistance, Sit to/from stand Upper Body Dressing : Set up, Sitting Upper Body Dressing Details (indicate cue type and reason): shirt without fasteners Lower Body Dressing: Sit to/from stand, Maximal assistance Toilet Transfer: Minimal assistance, BSC, Ambulation, RW Toileting- Clothing Manipulation and Hygiene: Sit to/from stand, Moderate assistance Toileting - Clothing Manipulation Details (indicate cue type and reason): patient assisted with clothing management but limited by poor balance and needing one arm on walker, patient able to wipe Tub/ Shower Transfer: Minimal assistance, Shower seat Functional mobility during ADLs: Minimal assistance, Rolling walker    Mobility  Overal bed mobility: Needs Assistance Bed Mobility: Supine to Sit Supine to sit: Min guard, HOB elevated Sit to supine: Min guard, HOB elevated General bed mobility comments: min guard with HOB  elevated, heavy use of bedrail to upright trunk and VCs to scoot out to EOB to place feet on floor    Transfers  Overall transfer level: Needs assistance Equipment used: Rolling walker (2 wheeled), 2 person hand held assist Transfers: Sit to/from Stand, Stand Pivot Transfers Sit to Stand: Min assist Stand pivot transfers: Min assist General transfer comment: VCs for hand placement, BLE braced against bed and min A to steady while powering up to stand, min A to steady with pivot over to recliner with bil HHA, VCs for sequencing    Ambulation / Gait / Stairs / Wheelchair Mobility  Ambulation/Gait Ambulation/Gait assistance: Mod assist Gait Distance (Feet):  10 Feet Assistive device: Rolling walker (2 wheeled) Gait Pattern/deviations: Step-to pattern, Decreased stride length, Narrow base of support General Gait Details: ataxic like step progression, maintains narrow BOS with occasional brushing of one leg against the other, VCs and min A to maneuver RW at safe distance, denies dizziness but limited by fatigue requiring seated rest in recliner    Posture / Balance Dynamic Sitting Balance Sitting balance - Comments: able to bend over Balance Overall balance assessment: Needs assistance Sitting-balance support: No upper extremity supported, Feet unsupported Sitting balance-Leahy Scale: Good Sitting balance - Comments: able to bend over Standing balance support: Bilateral upper extremity supported Standing balance-Leahy Scale: Poor    Special needs/care consideration Diabetic management yes   Previous Home Environment (from acute therapy documentation) Living Arrangements: Spouse/significant other, Children Available Help at Discharge: Family, Available 24 hours/day Type of Home: House Home Layout: Able to live on main level with bedroom/bathroom Home Access: Stairs to enter Bathroom Shower/Tub: Chiropodist: Standard Home Care Services: No  Discharge Living  Setting Plans for Discharge Living Setting: [name] home, Lives with (comment) (spouse and daughter) Type of Home at Discharge: House Discharge Home Layout: One level Discharge Home Access: Stairs to enter Entrance Stairs-Rails: None Entrance Stairs-Number of Steps: 3-4 Discharge Bathroom Shower/Tub: Tub/shower unit Discharge Bathroom Toilet: Standard Discharge Bathroom Accessibility: Yes How Accessible: Accessible via walker Does the patient have any problems obtaining your medications?: Yes (Describe) (uninsured, has had trouble getting insulin)  Social/Family/Support Systems Patient Roles: Spouse, Parent Contact Information: daughter, Luetta Nutting, is 61 Anticipated Caregiver: Jeral Fruit (spouse) [phone]; Luetta Nutting [phone] Anticipated Caregiver's Contact Information: see above Ability/Limitations of Caregiver: supervision Caregiver Availability: 24/7 Discharge Plan Discussed with Primary Caregiver: Yes Is Caregiver In Agreement with Plan?: Yes Does Caregiver/Family have Issues with Lodging/Transportation while Pt is in Rehab?: No  Goals Patient/Family Goal for Rehab: PT/OT supervision to mod I, SLP mod I Expected length of stay: 14-16 days Pt/Family Agrees to Admission and willing to participate: Yes Program Orientation Provided & Reviewed with Pt/Caregiver Including Roles  & Responsibilities: Yes  Barriers to Discharge: Insurance for SNF coverage, Home environment access/layout  Decrease burden of Care through IP rehab admission: n/a  Possible need for SNF placement upon discharge: Not anticipated  Patient Condition: I have reviewed medical records from [facility], spoken with CM, and patient. I discussed via phone for inpatient rehabilitation assessment.  Patient will benefit from ongoing PT, OT, and SLP, can actively participate in 3 hours of therapy a day 5 days of the week, and can make measurable gains during the admission.  Patient will also benefit from the coordinated team  approach during an Inpatient Acute Rehabilitation admission.  The patient will receive intensive therapy as well as Rehabilitation physician, nursing, social worker, and care management interventions.  Due to safety, disease management, medication administration, pain management, and patient education the patient requires 24 hour a day rehabilitation nursing.  The patient is currently mod assist with mobility and basic ADLs.  Discharge setting and therapy post discharge at home with home health is anticipated.  Patient has agreed to participate in the Acute Inpatient Rehabilitation Program and will admit today.  Preadmission Screen Completed By:  Michel Santee, PT, DPT 01/12/2021 10:15 AM ______________________________________________________________________   Discussed status with Dr. Dagoberto Ligas on 01/12/21 at 10:22 AM  and received approval for admission today.  Admission Coordinator:  Michel Santee, PT, DPT time 10:22 AM Sudie Grumbling 01/12/21    Assessment/Plan: Diagnosis: Does the need for close, 24  hr/day Medical supervision in concert with the patient's rehab needs make it unreasonable for this patient to be served in a less intensive setting? Yes Co-Morbidities requiring supervision/potential complications: uncontrolled DM- A1c 10.7; osmotic demyelination syndrome; NASH; cirrhosis and esophageal varices; hepatic encephalopathy; weakness, dysarthria Due to bladder management, bowel management, safety, skin/wound care, disease management, medication administration, pain management, and patient education, does the patient require 24 hr/day rehab nursing? Yes Does the patient require coordinated care of a physician, rehab nurse, PT, OT, and SLP to address physical and functional deficits in the context of the above medical diagnosis(es)? Yes Addressing deficits in the following areas: balance, endurance, locomotion, strength, transferring, bowel/bladder control, bathing, dressing, feeding, grooming,  toileting, and speech Can the patient actively participate in an intensive therapy program of at least 3 hrs of therapy 5 days a week? Yes The potential for patient to make measurable gains while on inpatient rehab is good and fair Anticipated functional outcomes upon discharge from inpatient rehab: modified independent and supervision PT, modified independent and supervision OT, modified independent and supervision SLP Estimated rehab length of stay to reach the above functional goals is: 14-16 days Anticipated discharge destination: Home 10. Overall Rehab/Functional Prognosis: good   MD Signature:

## 2021-01-12 NOTE — Progress Notes (Signed)
[facility] arrived for transport to [facility]. Report called to CIR, Sam RN. Pt's belongings packed and transported with pt. Pt d/c'd via [facility] stretcher with PIV in place to complete mag infusion. 12pm insulin SSI not administered d/t unknown time for eating lunch. CIR RN notified of need for noon SSI.

## 2021-01-12 NOTE — Discharge Summary (Signed)
Physician Discharge Summary  [name] WTU:[id number] DOB: Aug 19, 1960 DOA: 01/08/2021  PCP: Merryl Hacker, No  Admit date: 01/08/2021 Discharge date: 01/12/2021  Admitted From: home Discharge disposition: CIR   Recommendations for Outpatient Follow-Up:   To CIR Will need either outpatient or inpatient NS Eval for spinal stenosis Will need PCP/insulin when discharged as she has been out since loosing her job Monitor Magnesium Titrate lactulose to 3-4 BM/day   Discharge Diagnosis:   Principal Problem:   Hepatic encephalopathy ([place]) Active Problems:   Hyperlipidemia associated with type 2 diabetes mellitus ([place])   Gastroesophageal reflux disease   Hypertension associated with diabetes ([place])   Esophageal varices without bleeding ([place])   Liver cirrhosis secondary to NASH [facility])    Discharge Condition: Improved.  Diet recommendation: Carbohydrate-modified.  Wound care: None.  Code status: Full.   History of Present Illness:   Meagan Peck is a 60 y.o. female with medical history significant of Meagan Peck cirrhosis with previous hepatic encephalopathy, diabetes, essential hypertension, hyperlipidemia, esophageal varices, GERD who was brought in by family secondary to confusion weakness for about a week.  Also having slurred speech.  Patient has had previous hepatic encephalopathy and suspected to have another 1.  Denied any fall or head injury.  No fever or chill.  Patient has been compliant with her medications including lactulose.  Her ammonia today is 83.  She has had similar episode before and turned out to be due to hepatic encephalopathy.  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 It is possible that her  sodium levels had been abnormal in the setting of her uncontrolled blood glucose, polyuria, and polydipsia, or as a complication of advanced cirrhosis    Degenerative changes of the cervical spine resulting in moderate to severe spinal canal stenosis at C4-5 and moderate at C5-6 and C6-7. There is mass effect on the cord at these levels, worse at C4-5 without cord signal abnormality. -outpatient NS eval  Hepatic encephalopathy  Ua unremarkable Ammonia elevated: increase lactulose,  for goal of bm 3-4times per 24hrs    Orthostatics mildly positive -add TED hose when up -s/p hydration   Hypomagnesemia Replace mag IV And PO   NASH cirrhosis with history of esophageal varices, thrombocytopenia Appears to be on Cipro chronically which is continued She does not appear to have significant ascites on exam, she denies abdominal pain ab [place] minimal ascites  Insulin-dependent type 2 diabetes, uncontrolled, with hyperglycemia A1c 10.7 -increase long acting  insulin to twice daily, increase meal coverage, continue SSI -met with DM coordinator, did not have insulin at home as no insurance   Generalized weakness,  PT recommends CIR    Medical Consultants:   neurology   Discharge Exam:   Vitals:   01/12/21 0558 01/12/21 0700  BP: (!) 143/54 (!) 142/54  Pulse: 71 76  Resp: 18 18  Temp: 98 F (36.7 C) 98.9 F (37.2 C)  SpO2: 97% 96%   Vitals:   01/11/21 1300 01/11/21 2106 01/12/21 0558 01/12/21 0700  BP:  (!) 140/57 (!) 143/54 (!) 142/54  Pulse:  62 71 76  Resp: 15 18 18 18   Temp:  97.9 F (36.6 C) 98 F (36.7 C) 98.9 F (37.2 C)  TempSrc:  Oral Oral Oral  SpO2:  100% 97% 96%  Weight:      Height:        General exam: tearful about her brain not working   The results of significant diagnostics from this hospitalization (including imaging, microbiology, ancillary and laboratory) are listed below for reference.     Procedures and Diagnostic Studies:   CT Head Wo  Contrast  Result Date: 01/08/2021 CLINICAL DATA:  Slurred speech, unsteady gait, weakness EXAM: CT HEAD WITHOUT CONTRAST TECHNIQUE: Contiguous axial images were obtained from the base of the skull through the vertex without intravenous contrast. COMPARISON:  None. FINDINGS: Brain: No acute infarct or hemorrhage. Lateral ventricles and midline structures are unremarkable. No acute extra-axial fluid collections. No mass effect. Vascular: No hyperdense vessel. Mild diffuse atherosclerosis of the internal carotid arteries. Skull: Normal. Negative for fracture or focal lesion. Sinuses/Orbits: No acute finding. Other: None. IMPRESSION: 1. No acute intracranial process. Electronically Signed   By: Randa Ngo M.D.   On: 01/08/2021 17:57   US Abdomen Limited  Result Date: 01/10/2021 CLINICAL DATA:  Ascites EXAM: LIMITED ABDOMEN ULTRASOUND FOR ASCITES TECHNIQUE: Limited ultrasound survey for ascites was performed in all four abdominal quadrants. COMPARISON:  09/18/2020 FINDINGS: Trace fluid in the right upper quadrant. No ascites is visualized in the left upper quadrant or bilateral lower pelvis. IMPRESSION: Trace upper abdominal ascites. This would be insufficient for paracentesis. Electronically Signed   By: Julian Hy M.D.   On: 01/10/2021 01:46     Labs:   Basic Metabolic Panel: Recent Labs  Lab 01/08/21 1634 01/09/21 0548 01/10/21 0549 01/11/21 0447 01/12/21 0424  NA 139 137 140 143 144  K 4.5 3.5 3.8 3.7 3.7  CL 105 103 107 110 111  CO2 23 23 24 23  19*  GLUCOSE 348* 316* 219* 142* 124*  BUN 15 14 13 14 14   CREATININE 0.94 0.81 0.97 0.76 0.69  CALCIUM 10.2 9.4 9.4 8.9 9.7  MG  --   --  1.5*  --  1.4*   GFR Estimated Creatinine Clearance: 58.2 mL/min (by C-G formula based on SCr of 0.69 mg/dL). Liver Function Tests: Recent Labs  Lab 01/08/21 1634 01/09/21 0548 01/12/21 0424  AST 54* 46* 102*  ALT 34 30 45*  ALKPHOS 126 103 120  BILITOT 5.2* 5.0* 6.1*  PROT 6.0* 5.0* 5.7*   ALBUMIN 3.2* 2.7* 2.9*   No results for input(s): LIPASE, AMYLASE in the last 168 hours. Recent Labs  Lab 01/08/21 1634 01/10/21 0549  AMMONIA 83* 139*   Coagulation profile No results for input(s): INR, PROTIME in the last 168 hours.  CBC: Recent Labs  Lab 01/08/21 1634 01/09/21 0548  WBC 5.3 4.6  NEUTROABS 3.3  --   HGB 13.2 11.6*  HCT 36.5 31.8*  MCV 91.0 90.3  PLT 110* 97*   Cardiac Enzymes: No results for input(s): CKTOTAL, CKMB, CKMBINDEX, TROPONINI in the last 168 hours. BNP: Invalid input(s): POCBNP CBG: Recent Labs  Lab 01/10/21 2024 01/11/21 0822 01/11/21 1226 01/11/21 1643 01/12/21 0803  GLUCAP 299* 126* 243* 288* 128*   D-Dimer No results for input(s): DDIMER in the last 72 hours. Hgb A1c No results for input(s): HGBA1C in the last 72 hours. Lipid Profile No results for input(s): CHOL, HDL, LDLCALC, TRIG, CHOLHDL, LDLDIRECT in the last 72 hours. Thyroid function studies No results for input(s): TSH, T4TOTAL, T3FREE, THYROIDAB in the last 72 hours.  Invalid input(s): FREET3 Anemia work up No results for input(s): VITAMINB12, FOLATE, FERRITIN, TIBC, IRON, RETICCTPCT in  the last 72 hours. Microbiology Recent Results (from the past 240 hour(s))  Resp Panel by RT-PCR (Flu A&B, Covid) Nasopharyngeal Swab     Status: None   Collection Time: 01/08/21  8:50 PM   Specimen: Nasopharyngeal Swab; Nasopharyngeal(NP) swabs in vial transport medium  Result Value Ref Range Status   SARS Coronavirus 2 by RT PCR NEGATIVE NEGATIVE Final    Comment: (NOTE) SARS-CoV-2 target nucleic acids are NOT DETECTED.  The SARS-CoV-2 RNA is generally detectable in upper respiratory specimens during the acute phase of infection. The lowest concentration of SARS-CoV-2 viral copies this assay can detect is 138 copies/mL. A negative result does not preclude SARS-Cov-2 infection and should not be used as the sole basis for treatment or other patient management decisions. A  negative result may occur with  improper specimen collection/handling, submission of specimen other than nasopharyngeal swab, presence of viral mutation(s) within the areas targeted by this assay, and inadequate number of viral copies(<138 copies/mL). A negative result must be combined with clinical observations, patient history, and epidemiological information. The expected result is Negative.  Fact Sheet for Patients:  EntrepreneurPulse.com.au  Fact Sheet for Healthcare Providers:  IncredibleEmployment.be  This test is no t yet approved or cleared by the [place] FDA and  has been authorized for detection and/or diagnosis of SARS-CoV-2 by FDA under an Emergency Use Authorization (EUA). This EUA will remain  in effect (meaning this test can be used) for the duration of the COVID-19 declaration under Section 564(b)(1) of the Act, 21 U.S.C.section 360bbb-3(b)(1), unless the authorization is terminated  or revoked sooner.       Influenza A by PCR NEGATIVE NEGATIVE Final   Influenza B by PCR NEGATIVE NEGATIVE Final    Comment: (NOTE) The Xpert Xpress SARS-CoV-2/FLU/RSV plus assay is intended as an aid in the diagnosis of influenza from Nasopharyngeal swab specimens and should not be used as a sole basis for treatment. Nasal washings and aspirates are unacceptable for Xpert Xpress SARS-CoV-2/FLU/RSV testing.  Fact Sheet for Patients: EntrepreneurPulse.com.au  Fact Sheet for Healthcare Providers: IncredibleEmployment.be  This test is not yet approved or cleared by the [place] FDA and has been authorized for detection and/or diagnosis of SARS-CoV-2 by FDA under an Emergency Use Authorization (EUA). This EUA will remain in effect (meaning this test can be used) for the duration of the COVID-19 declaration under Section 564(b)(1) of the Act, 21 U.S.C. section 360bbb-3(b)(1), unless the authorization  is terminated or revoked.  Performed at [facility], [place] [address], [place], [place] [address]      Discharge Instructions:   Discharge Instructions     Diet Carb Modified   Complete by: As directed    Increase activity slowly   Complete by: As directed       Allergies as of 01/12/2021       Reactions   Penicillins Anaphylaxis, Other (See Comments)   Happened as a young child   Atrovent [ipratropium] Palpitations   Naproxen Rash   Quinine Derivatives Rash   Sulfa Antibiotics Rash        Medication List     STOP taking these medications    AMBULATORY NON FORMULARY MEDICATION   HumaLOG KwikPen 200 UNIT/ML KwikPen Generic drug: insulin lispro   Tresiba FlexTouch 200 UNIT/ML FlexTouch Pen Generic drug: insulin degludec       TAKE these medications    Accu-Chek FastClix Lancets Misc Check fsbs TID   acetaminophen 500 MG tablet Commonly known as: TYLENOL  Take 500-1,000 mg by mouth every 6 (six) hours as needed for moderate pain, headache or mild pain.   cetirizine 10 MG tablet Commonly known as: ZYRTEC Take 1 tablet (10 mg total) by mouth daily. What changed:  when to take this reasons to take this   ciprofloxacin 500 MG tablet Commonly known as: CIPRO Take 500 mg by mouth daily with breakfast.   ferrous sulfate 325 (65 FE) MG EC tablet Take 1 tablet (325 mg total) by mouth 2 (two) times daily with a meal.   insulin aspart 100 UNIT/ML injection Commonly known as: novoLOG Inject 12 Units into the skin 3 (three) times daily with meals.   insulin aspart 100 UNIT/ML injection Commonly known as: novoLOG Inject 0-9 Units into the skin 3 (three) times daily with meals.   insulin aspart 100 UNIT/ML injection Commonly known as: novoLOG Inject 0-5 Units into the skin at bedtime.   insulin glargine-yfgn 100 UNIT/ML injection Commonly known as: SEMGLEE Inject 0.2 mLs (20 Units total) into the skin 2 (two) times daily.   Krill  Oil 500 MG Caps Take 1,000 mg by mouth 2 (two) times daily.   lactulose 10 GM/15ML solution Commonly known as: CHRONULAC Take 45 mLs (30 g total) by mouth 3 (three) times daily. What changed:  how much to take when to take this   magnesium oxide 400 (240 Mg) MG tablet Commonly known as: MAG-OX TAKE 1 TABLET BY MOUTH TWICE DAILY   MULTI-DAY PO Take 1 tablet by mouth daily.   pantoprazole 40 MG tablet Commonly known as: PROTONIX TAKE 1 TABLET (40 MG TOTAL) BY MOUTH DAILY. What changed:  how much to take when to take this   UltiCare Micro Pen Needles 32G X 4 MM Misc Generic drug: Insulin Pen Needle USE TO INJECT TRESIBA   Vitamin D3 125 MCG (5000 UT) Tabs Take 5,000 Units by mouth at bedtime.          Time coordinating discharge: 35 min  Signed:  Geradine Girt DO  [facility] 01/12/2021, 9:54 AM

## 2021-01-12 NOTE — Discharge Instructions (Signed)
Inpatient Rehab Discharge Instructions  MIKEA QUADROS Discharge date and time: No discharge date for patient encounter.   Activities/Precautions/ Functional Status: Activity: activity as tolerated Diet: carb modified Wound Care: routine skin checks Functional status:  ___ No restrictions     ___ Walk up steps independently ___ 24/7 supervision/assistance   ___ Walk up steps with assistance ___ Intermittent supervision/assistance  ___ Bathe/dress independently ___ Walk with walker     _x__ Bathe/dress with assistance ___ Walk Independently    ___ Shower independently ___ Walk with assistance    ___ Shower with assistance ___ No alcohol     ___ Return to work/school ________  Special Instructions:  No driving smoking or alcohol  My questions have been answered and I understand these instructions. I will adhere to these goals and the provided educational materials after my discharge from the hospital.  Patient/Caregiver Signature _______________________________ Date __________  Clinician Signature _______________________________________ Date __________  Please bring this form and your medication list with you to all your follow-up doctor's appointments.

## 2021-01-12 NOTE — H&P (Signed)
Physical Medicine and Rehabilitation Admission H&P    CC: Weakness   HPI: Meagan Peck is a 60 year old right-handed female with history significant of [name] cirrhosis with previous hepatic encephalopathy maintained on chronic Cipro, diabetes mellitus hypertension hyperlipidemia, esophageal varices/GERD.  Per chart review patient lives with spouse and family.  Two-level home able to live on the main level.  Needed some assist with ADLs she mostly sponge bathes.  Presented 01/08/2021 altered mental status and slurred speech.  Denied any chills or fever.  Admission chemistries unremarkable except glucose 348, AST 54, ammonia level 83, hemoglobin A1c 10.7, hemoglobin 9.2, urinalysis negative nitrite.  Cranial CT scan negative for acute changes.  MRI showed striking abnormal diffusion, T2 and flair signal abnormality symmetrically affecting the pons with sparing of the bilateral cortical infarcts favoring osmotic demyelination syndrome.  Relatively subtle signal changes also in the bilateral thalamus felt to be nonspecific.  Ultrasound the abdomen showed trace upper abdominal ascites and no plan for paracentesis.  MRI cervical spine degenerative changes moderate to severe spinal stenosis C4-5 and moderate C5-6 and C6-7.  There was some mass-effect on the cord at these levels worse at C4-5 without cord signal abnormality.  Latest ammonia level 139 continues on scheduled Chronulac.  Therapy evaluations completed due to patient decreased functional mobility was admitted for a comprehensive rehab program.   Pt reports LBM 1-2 days ago- not going 2-3x/day right now.  No pain lately except HA- which she doesn't have right now.  When has it, it improves with tylenol.  IV running Mg Sulfate-    Review of Systems  Constitutional:  Positive for malaise/fatigue. Negative for chills and fever.  HENT:  Negative for hearing loss.   Eyes:  Negative for blurred vision and double vision.  Respiratory:  Negative for  cough.        Dyspnea on exertion  Cardiovascular:  Positive for leg swelling. Negative for chest pain and palpitations.  Gastrointestinal:  Positive for constipation. Negative for heartburn, nausea and vomiting.       GERD  Genitourinary:  Negative for dysuria, flank pain and hematuria.  Musculoskeletal:  Positive for joint pain and myalgias.  Skin:  Negative for rash.  Psychiatric/Behavioral:  The patient has insomnia.   All other systems reviewed and are negative.     Past Medical History:  Diagnosis Date   Acute medial meniscus tear of right knee 12/10/2015   Allergy     Anasarca     Arthritis      bil feet   Ascites     Cataract      bilateral - surgery to remove   Cirrhosis ([place])     Diabetes mellitus without complication ([place])      type 2 - last a1c was 5.2 per patient, no meds   Dyspnea      when I have too much fluid   Esophageal varices ([facility])     GERD (gastroesophageal reflux disease)     Heart murmur     Hepatic cirrhosis ([facility])     HLD (hyperlipidemia)      no meds   Hypertension      no meds   Iron deficiency anemia 09/26/2018         Past Surgical History:  Procedure Laterality Date   BIOPSY   08/20/2019    Procedure: BIOPSY;  Surgeon: Jackquline Denmark, MD;  Location: [facility] ENDOSCOPY;  Service: Gastroenterology;;   CARPAL TUNNEL RELEASE Right     CESAREAN  SECTION       CHOLECYSTECTOMY       COLONOSCOPY   08/2019   ESOPHAGEAL BANDING N/A 09/19/2019    Procedure: ESOPHAGEAL BANDING;  Surgeon: Yetta Flock, MD;  Location: [facility] ENDOSCOPY;  Service: Gastroenterology;  Laterality: N/A;   ESOPHAGOGASTRODUODENOSCOPY (EGD) WITH PROPOFOL N/A 08/20/2019    Procedure: ESOPHAGOGASTRODUODENOSCOPY (EGD) WITH PROPOFOL;  Surgeon: Jackquline Denmark, MD;  Location: [place];  Service: Gastroenterology;  Laterality: N/A;   ESOPHAGOGASTRODUODENOSCOPY (EGD) WITH PROPOFOL N/A 09/19/2019    Procedure: ESOPHAGOGASTRODUODENOSCOPY (EGD) WITH PROPOFOL;  Surgeon: Yetta Flock, MD;   Location: [facility] ENDOSCOPY;  Service: Gastroenterology;  Laterality: N/A;   ESOPHAGOGASTRODUODENOSCOPY (EGD) WITH PROPOFOL N/A 01/16/2020    Procedure: ESOPHAGOGASTRODUODENOSCOPY (EGD) WITH PROPOFOL;  Surgeon: Jerene Bears, MD;  Location: [place] ENDOSCOPY;  Service: Endoscopy;  Laterality: N/A;   EYE SURGERY Bilateral      removed cataracts   GASTRIC VARICES BANDING N/A 08/20/2019    Procedure: GASTRIC VARICES BANDING;  Surgeon: Jackquline Denmark, MD;  Location: [place];  Service: Gastroenterology;  Laterality: N/A;   HOT HEMOSTASIS N/A 01/16/2020    Procedure: HOT HEMOSTASIS (ARGON PLASMA COAGULATION/BICAP);  Surgeon: Jerene Bears, MD;  Location: [facility] ENDOSCOPY;  Service: Endoscopy;  Laterality: N/A;   IR PARACENTESIS   08/01/2019   IR PARACENTESIS   08/19/2019   IR PARACENTESIS   09/09/2019   IR PARACENTESIS   09/23/2019   IR PARACENTESIS   10/08/2019   IR PARACENTESIS   10/23/2019   IR PARACENTESIS   11/12/2019   IR PARACENTESIS   11/21/2019   IR PARACENTESIS   11/25/2019   IR PARACENTESIS   12/05/2019   IR PARACENTESIS   12/13/2019   IR PARACENTESIS   12/20/2019   IR PARACENTESIS   12/26/2019   IR PARACENTESIS   12/31/2019   IR PARACENTESIS   01/07/2020   IR PARACENTESIS   01/10/2020   IR PARACENTESIS   01/13/2020   IR PARACENTESIS   01/14/2020   IR PARACENTESIS   01/20/2020   IR PARACENTESIS   01/30/2020   IR PARACENTESIS   02/24/2020   IR RADIOLOGIST EVAL & MGMT   10/30/2019   IR RADIOLOGIST EVAL & MGMT   12/11/2019   IR RADIOLOGIST EVAL & MGMT   12/24/2019   IR RADIOLOGIST EVAL & MGMT   03/05/2020   IR RADIOLOGIST EVAL & MGMT   05/15/2020   IR TIPS   11/25/2019   IR TIPS   01/14/2020   KNEE ARTHROSCOPY Right 12/10/2015    Procedure: RIGHT KNEE ARTHROSCOPY WITH PARTIAL MEDIAL MENISCECTOMY;  Surgeon: Mcarthur Rossetti, MD;  Location: [facility] ORS;  Service: Orthopedics;  Laterality: Right;   KNEE ARTHROSCOPY W/ MENISCAL REPAIR Bilateral     MENISCUS REPAIR Left     RADIOLOGY WITH ANESTHESIA N/A 11/25/2019     Procedure: TIPS;  Surgeon: Corrie Mckusick, DO;  Location: [place];  Service: Anesthesiology;  Laterality: N/A;   RADIOLOGY WITH ANESTHESIA N/A 01/14/2020    Procedure: TIPS;  Surgeon: Corrie Mckusick, DO;  Location: [place];  Service: Anesthesiology;  Laterality: N/A;         Family History  Problem Relation Age of Onset   Hypertension Mother     COPD Mother     Alcoholism Father     Heart attack Father     Diabetes Brother     Diabetes Paternal Grandmother     Heart disease Paternal Grandmother     Alcoholism Paternal Uncle  Alcoholism Maternal Uncle     Heart disease Maternal Grandmother     Heart disease Maternal Grandfather     Heart disease Paternal Grandfather      Social History:  reports that she has never smoked. She has never used smokeless tobacco. She reports that she does not drink alcohol and does not use drugs. Allergies:       Allergies  Allergen Reactions   Penicillins Anaphylaxis and Other (See Comments)      Happened as a young child   Atrovent [Ipratropium] Palpitations   Naproxen Rash   Quinine Derivatives Rash   Sulfa Antibiotics Rash          Medications Prior to Admission  Medication Sig Dispense Refill   acetaminophen (TYLENOL) 500 MG tablet Take 500-1,000 mg by mouth every 6 (six) hours as needed for moderate pain, headache or mild pain.       cetirizine (ZYRTEC) 10 MG tablet Take 1 tablet (10 mg total) by mouth daily. (Patient taking differently: Take 10 mg by mouth daily as needed for allergies.) 90 tablet 3   Cholecalciferol (VITAMIN D3) 5000 UNITS TABS Take 5,000 Units by mouth at bedtime.       ciprofloxacin (CIPRO) 500 MG tablet Take 500 mg by mouth daily with breakfast.       ferrous sulfate 325 (65 FE) MG EC tablet Take 1 tablet (325 mg total) by mouth 2 (two) times daily with a meal. 180 tablet 1   insulin degludec (TRESIBA) 200 UNIT/ML FlexTouch Pen INJECT 10 - 50 UNITS INTO THE SKIN DAILY. TARGET TO FASTING GLUCOSE 150. (Patient taking  differently: Inject 20 Units into the skin daily after breakfast.) 15 mL 99   insulin lispro (HUMALOG) 200 UNIT/ML KwikPen INJECT 10 - 15 UNITS INTO THE SKIN 3 TIMES DAILY BEFORE MEALS (Patient taking differently: Inject 20 Units into the skin 3 (three) times daily after meals.) 15 mL 1   Krill Oil 500 MG CAPS Take 1,000 mg by mouth 2 (two) times daily.       lactulose, encephalopathy, (CHRONULAC) 10 GM/15ML SOLN Take 30 mLs (20 g total) by mouth 2 (two) times daily. 236 mL 0   Multiple Vitamin (MULTI-DAY PO) Take 1 tablet by mouth daily.       pantoprazole (PROTONIX) 40 MG tablet TAKE 1 TABLET (40 MG TOTAL) BY MOUTH DAILY. (Patient taking differently: Take 40 mg by mouth daily before breakfast.) 90 tablet 3   ACCU-CHEK FASTCLIX LANCETS MISC Check fsbs TID       AMBULATORY NON FORMULARY MEDICATION Wedge pillow 1 each 0   Insulin Pen Needle 32G X 4 MM MISC USE TO INJECT TRESIBA 100 each 12   magnesium oxide (MAG-OX) 400 (240 Mg) MG tablet TAKE 1 TABLET BY MOUTH TWICE DAILY (Patient not taking: No sig reported) 120 tablet 1      Drug Regimen Review Drug regimen was reviewed and remains appropriate with no significant issues identified   Home: Home Living Family/patient expects to be discharged to:: Private residence Living Arrangements: Spouse/significant other, Children Available Help at Discharge: Family, Available 24 hours/day Type of Home: House Home Access: Stairs to enter Home Layout: Able to live on main level with bedroom/bathroom Bathroom Shower/Tub: Chiropodist: Standard Home Equipment: Cane - single point   Functional History: Prior Function Level of Independence: Needs assistance Gait / Transfers Assistance Needed: uses cane for ambulation. ADL's / Homemaking Assistance Needed: daughter helps with bathing,   Functional Status:  Mobility:  Bed Mobility Overal bed mobility: Needs Assistance Bed Mobility: Supine to Sit Supine to sit: Min guard Sit to  supine: Min guard, HOB elevated General bed mobility comments: Increased time. Relies on bedrail to before transfers. Transfers Overall transfer level: Needs assistance Equipment used: Rolling walker (2 wheeled) Transfers: Sit to/from Stand Sit to Stand: Min assist Stand pivot transfers: Min assist General transfer comment: Assist to rise, steady, control descent. Wide BOS and unsteady.Cues for safety, technique, hand placement. Stand pivot, bed to recliner, using RW Ambulation/Gait Ambulation/Gait assistance: Mod assist Gait Distance (Feet): 5 Feet Assistive device: Rolling walker (2 wheeled) Gait Pattern/deviations: Wide base of support, Step-through pattern General Gait Details: Assist to stabilize/support pt and to manage RW. Some R LE ataxia observed. Pt remains unsteady and at risk for falls. Ambulation distance limited by dizziness. Walked 5 feet forwards then backwards with RW. Pt reported some dizziness with ambulation on today.   ADL: ADL Overall ADL's : Needs assistance/impaired Eating/Feeding: Set up, Sitting Eating/Feeding Details (indicate cue type and reason): with difficulty due to impaired coordination Grooming: Set up, Sitting Upper Body Bathing: Set up, Sitting Lower Body Bathing: Moderate assistance, Sit to/from stand Upper Body Dressing : Set up, Sitting Upper Body Dressing Details (indicate cue type and reason): shirt without fasteners Lower Body Dressing: Sit to/from stand, Maximal assistance Toilet Transfer: Minimal assistance, BSC, Ambulation, RW Toileting- Clothing Manipulation and Hygiene: Sit to/from stand, Moderate assistance Toileting - Clothing Manipulation Details (indicate cue type and reason): patient assisted with clothing management but limited by poor balance and needing one arm on walker, patient able to wipe Tub/ Shower Transfer: Minimal assistance, Shower seat Functional mobility during ADLs: Minimal assistance, Rolling walker    Cognition: Cognition Overall Cognitive Status: Within Functional Limits for tasks assessed Orientation Level: Oriented X4 Cognition Arousal/Alertness: Awake/alert Behavior During Therapy: WFL for tasks assessed/performed Overall Cognitive Status: Within Functional Limits for tasks assessed   Physical Exam: Blood pressure (!) 142/54, pulse 76, temperature 98.9 F (37.2 C), temperature source Oral, resp. rate 18, height 4' 10"  (1.473 m), weight 59 kg, last menstrual period 12/03/2013, SpO2 96 %. Physical Exam Vitals and nursing note reviewed. Exam conducted with a chaperone present.  Constitutional:      Comments: Pt laying supine in bed; nurses and husband in room; appears jaundiced and ill, NAD  HENT:     Head: Normocephalic and atraumatic.     Comments: No facial droop? Tongue midline    Right Ear: External ear normal.     Left Ear: External ear normal.     Nose: Nose normal.     Mouth/Throat:     Mouth: Mucous membranes are moist.     Pharynx: Oropharynx is clear. No oropharyngeal exudate.  Eyes:     General:        Right eye: No discharge.        Left eye: No discharge.     Extraocular Movements: Extraocular movements intact.  Cardiovascular:     Rate and Rhythm: Normal rate and regular rhythm.     Heart sounds: Normal heart sounds. No murmur heard.   No gallop.  Pulmonary:     Comments: CTA B/L- no W/R/R- good air movement       Abdominal:     Comments: Umbilical hernia- moderate Soft, NT, ND, (+)BS    Musculoskeletal:     Cervical back: Normal range of motion. No rigidity.     Comments: RUE 4-/5 esp biceps and FA LUE- 4+/5 RLE- all  4-/5 LLE- 4+/5   Skin:    Comments: No skin breakdown on exam R forearm IV- looks OK Jaundiced skin, esp on torso Abrasion R shin- small- trace LE edema to ankles  Neurological:     Comments: Patient is alert.  Mood is a bit flat.  Speech is a bit dysarthric but intelligible.  Makes eye contact with examiner provides name and  age.  Limited but fair medical historian. Significant dysarthria- hard to understand at times- c/o hiccups No Axterixis Intact to light touch in all 4 extremities  Psychiatric:        Behavior: Behavior normal.     Comments: Slightly delayed processing; flat affect      Lab Results Last 48 Hours        Results for orders placed or performed during the hospital encounter of 01/08/21 (from the past 48 hour(s))  Glucose, capillary     Status: Abnormal    Collection Time: 01/10/21 11:27 AM  Result Value Ref Range    Glucose-Capillary 435 (H) 70 - 99 mg/dL      Comment: Glucose reference range applies only to samples taken after fasting for at least 8 hours.  Glucose, capillary     Status: Abnormal    Collection Time: 01/10/21 12:27 PM  Result Value Ref Range    Glucose-Capillary 491 (H) 70 - 99 mg/dL      Comment: Glucose reference range applies only to samples taken after fasting for at least 8 hours.  Glucose, capillary     Status: Abnormal    Collection Time: 01/10/21  4:40 PM  Result Value Ref Range    Glucose-Capillary 420 (H) 70 - 99 mg/dL      Comment: Glucose reference range applies only to samples taken after fasting for at least 8 hours.  Glucose, capillary     Status: Abnormal    Collection Time: 01/10/21  8:24 PM  Result Value Ref Range    Glucose-Capillary 299 (H) 70 - 99 mg/dL      Comment: Glucose reference range applies only to samples taken after fasting for at least 8 hours.  Basic metabolic panel     Status: Abnormal    Collection Time: 01/11/21  4:47 AM  Result Value Ref Range    Sodium 143 135 - 145 mmol/L    Potassium 3.7 3.5 - 5.1 mmol/L    Chloride 110 98 - 111 mmol/L    CO2 23 22 - 32 mmol/L    Glucose, Bld 142 (H) 70 - 99 mg/dL      Comment: Glucose reference range applies only to samples taken after fasting for at least 8 hours.    BUN 14 6 - 20 mg/dL    Creatinine, Ser 0.76 0.44 - 1.00 mg/dL    Calcium 8.9 8.9 - 10.3 mg/dL    GFR, Estimated >60 >60  mL/min      Comment: (NOTE) Calculated using the CKD-EPI Creatinine Equation (2021)      Anion gap 10 5 - 15      Comment: Performed at [facility], [place] [address], [place], [place] [address]  Glucose, capillary     Status: Abnormal    Collection Time: 01/11/21  8:22 AM  Result Value Ref Range    Glucose-Capillary 126 (H) 70 - 99 mg/dL      Comment: Glucose reference range applies only to samples taken after fasting for at least 8 hours.    Comment 1 Notify RN  Comment 2 Document in Chart    Glucose, capillary     Status: Abnormal    Collection Time: 01/11/21 12:26 PM  Result Value Ref Range    Glucose-Capillary 243 (H) 70 - 99 mg/dL      Comment: Glucose reference range applies only to samples taken after fasting for at least 8 hours.    Comment 1 Notify RN      Comment 2 Document in Chart    Glucose, capillary     Status: Abnormal    Collection Time: 01/11/21  4:43 PM  Result Value Ref Range    Glucose-Capillary 288 (H) 70 - 99 mg/dL      Comment: Glucose reference range applies only to samples taken after fasting for at least 8 hours.  Magnesium     Status: Abnormal    Collection Time: 01/12/21  4:24 AM  Result Value Ref Range    Magnesium 1.4 (L) 1.7 - 2.4 mg/dL      Comment: Performed at [facility], [place] [address] [place], [place] [address]  Comprehensive metabolic panel     Status: Abnormal    Collection Time: 01/12/21  4:24 AM  Result Value Ref Range    Sodium 144 135 - 145 mmol/L    Potassium 3.7 3.5 - 5.1 mmol/L    Chloride 111 98 - 111 mmol/L    CO2 19 (L) 22 - 32 mmol/L    Glucose, Bld 124 (H) 70 - 99 mg/dL      Comment: Glucose reference range applies only to samples taken after fasting for at least 8 hours.    BUN 14 6 - 20 mg/dL    Creatinine, Ser 0.69 0.44 - 1.00 mg/dL    Calcium 9.7 8.9 - 10.3 mg/dL    Total Protein 5.7 (L) 6.5 - 8.1 g/dL    Albumin 2.9 (L) 3.5 - 5.0 g/dL    AST 102 (H) 15 - 41 U/L    ALT 45  (H) 0 - 44 U/L    Alkaline Phosphatase 120 38 - 126 U/L    Total Bilirubin 6.1 (H) 0.3 - 1.2 mg/dL    GFR, Estimated >60 >60 mL/min      Comment: (NOTE) Calculated using the CKD-EPI Creatinine Equation (2021)      Anion gap 14 5 - 15      Comment: Performed at [facility], [place] [address], [place], [place] [address]  Glucose, capillary     Status: Abnormal    Collection Time: 01/12/21  8:03 AM  Result Value Ref Range    Glucose-Capillary 128 (H) 70 - 99 mg/dL      Comment: Glucose reference range applies only to samples taken after fasting for at least 8 hours.    Comment 1 Notify RN         Imaging Results (Last 48 hours)  MR CERVICAL SPINE WO CONTRAST   Result Date: 01/11/2021 CLINICAL DATA:  Demyelinating disease. EXAM: MRI CERVICAL SPINE WITHOUT CONTRAST TECHNIQUE: Multiplanar, multisequence MR imaging of the cervical spine was performed. No intravenous contrast was administered. COMPARISON:  None. FINDINGS: Alignment: Straightening of the cervical curvature. Vertebrae: No fracture, evidence of discitis, or bone lesion. Cord: Mass effect on the cord at the C4-5, C5-6 and C6-7 levels. No gross cord signal abnormality. Posterior Fossa, vertebral arteries, paraspinal tissues: Prominent T2 hyperintensity within the pons and in the bilateral middle cerebellar peduncles, with sparing of the bilateral cortical spinal tracts, better evaluated on recent  MRI of the brain performed on January 10, 2021. Disc levels: C2-3: Small posterior disc protrusion and mild facet degenerative changes without significant spinal canal or neural foraminal stenosis. C3-4: Small posterior disc protrusion resulting in mild spinal canal stenosis. Uncovertebral and facet degenerative changes resulting in mild right neural foraminal narrowing. C4-5: Posterior disc osteophyte complex resulting in moderate to severe spinal canal stenosis with mass effect cord. Uncovertebral and facet degenerative changes  resulting in severe right neural foraminal narrowing. C5-6: Posterior disc osteophyte complex and prominence of the ligamentum flavum resulting in moderate spinal canal stenosis with mass effect on the cord. Uncovertebral facet degenerative changes noting joint effusion in the right facet joint. Findings result in severe right and moderate left neural foraminal narrowing. C6-7: Posterior disc osteophyte complex resulting in moderate spinal canal stenosis with mass effect the cord. Uncovertebral and facet degenerative changes resulting in mild bilateral neural foraminal narrowing. C7-T1: Posterior disc protrusion and prominence of the ligamentum flavum resulting in mild spinal canal stenosis. Facet degenerative changes without significant neural foraminal narrowing. IMPRESSION: 1. Degenerative changes of the cervical spine resulting in moderate to severe spinal canal stenosis at C4-5 and moderate at C5-6 and C6-7. There is mass effect on the cord at these levels, worse at C4-5 without cord signal abnormality. 2. Multilevel high-grade neural foraminal narrowing severe on the right at C4-5 and C5-6 and moderate on the left at C5-6. 3. Prominent T2 hyperintensity within the pons and less evident in the bilateral middle cerebellar peduncles suggestive of osmotic demyelination. This is best characterized on recent MRI of the brain performed January 10, 2021. Electronically Signed   By: Pedro Earls M.D.   On: 01/11/2021 14:55             Medical Problem List and Plan: 1.  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  Continue scheduled Chronulac.PATIENT IS MAINTAINED ON CHRONIC CIPRO              -patient may  shower             -ELOS/Goals: 14-16 days min A to supervision 2.  Antithrombotics: -DVT/anticoagulation: SCDs Pharmaceutical: Other (comment)             -antiplatelet therapy: N/A 3.  Pain Management: Tylenol as needed for HA 4. Mood: Provide emotional support             -antipsychotic agents: N/A 5. Neuropsych: This patient is capable of making decisions on her own behalf. 6. Skin/Wound Care: Routine skin checks 7. Fluids/Electrolytes/Nutrition: Routine in and outs with follow-up chemistries 8.  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  Neurology follow-up conservative care 9.  Diabetes mellitus.  Hemoglobin A1c 10.7.  NovoLog 12 units 3 times daily, Semglee 20 units twice daily.  Diabetic teaching- couldn't afford insulin at home.  10.  Acute on chronic anemia.  Continue iron supplement.  Follow-up CBC 11. Constipation- LBM yesterday- will get back going 2-3x/day for ammonia levels.  12. Hiccups- will need to d/w pharmacy if can do baclofen vs thorazine due to NASH.      Lavon Paganini Angiulli, PA-C 01/12/2021    I have personally performed a face to face diagnostic evaluation of this patient and formulated the key components of the plan.  Additionally, I have personally reviewed laboratory data, imaging studies, as well as relevant notes and concur with the physician assistant's documentation above.   The patient's status has not  changed from the original H&P.  Any changes in documentation from the acute care chart have been noted above.

## 2021-01-12 NOTE — Progress Notes (Signed)
Physical Therapy Treatment Patient Details Name: Meagan Peck MRN: [id number] DOB: 04-17-1961 Today's Date: 01/12/2021   History of Present Illness 60 yo female admitted with hepatic encephalopathy, weakness, slurred speech. Hx of NASH, DM, hepatic encephalopathy, cirrhosis. MRI reporing Striking abnormal diffusion, T2 and FLAIR signal abnormality symmetrically affecting the pons with sparing of the bilateral  cortical spinal tracts.  Favor Osmotic Demyelination syndrome.    PT Comments    Pt very emotional upon arrival, crying while slouched in bed with breakfast tray across her, stating it is difficult to eat. PT and RN assisted pt to chair and prepared breakfast, pt able to better reach meal while upright in chair. VC for sequencing and hand placement, min A with bil HHA to power to stand and pivot to recliner. Returned for exercsie and gait training later once pt ate and less emotional, still requires encouragement and soothing at times with good results. Pt ambulates with ataxic-like step progression, mod A due to generally unsteady, assist to maneuver RW at safe distance and VC due to narrow BOS with occasional brushing of BLE against one another. Pt fatigues with ambulation, but tolerates seated BLE strengthening exercise. Pt tolerates remaninig up in chair with nursing student in room at [facility]. Pt still in agreement with CIR recommendation.    Recommendations for follow up therapy are one component of a multi-disciplinary discharge planning process, led by the attending physician.  Recommendations may be updated based on patient status, additional functional criteria and insurance authorization.  Follow Up Recommendations  CIR     Equipment Recommendations  Rolling walker with 5" wheels    Recommendations for Other Services OT consult;Rehab consult     Precautions / Restrictions Precautions Precautions: Fall Restrictions Weight Bearing Restrictions: No     Mobility  Bed  Mobility Overal bed mobility: Needs Assistance Bed Mobility: Supine to Sit  Supine to sit: Min guard;HOB elevated  General bed mobility comments: min guard with HOB elevated, heavy use of bedrail to upright trunk and VCs to scoot out to EOB to place feet on floor    Transfers Overall transfer level: Needs assistance Equipment used: Rolling walker (2 wheeled);2 person hand held assist Transfers: Sit to/from [facility] Sit to Stand: Min assist Stand pivot transfers: Min assist  General transfer comment: VCs for hand placement, BLE braced against bed and min A to steady while powering up to stand, min A to steady with pivot over to recliner with bil HHA, VCs for sequencing  Ambulation/Gait Ambulation/Gait assistance: Mod assist Gait Distance (Feet): 10 Feet Assistive device: Rolling walker (2 wheeled) Gait Pattern/deviations: Step-to pattern;Decreased stride length;Narrow base of support  General Gait Details: ataxic like step progression, maintains narrow BOS with occasional brushing of one leg against the other, VCs and min A to maneuver RW at safe distance, denies dizziness but limited by fatigue requiring seated rest in recliner   Stairs             Wheelchair Mobility    Modified Rankin (Stroke Patients Only)       Balance Overall balance assessment: Needs assistance  Standing balance support: Bilateral upper extremity supported Standing balance-Leahy Scale: Poor       Cognition Arousal/Alertness: Awake/alert Behavior During Therapy: WFL for tasks assessed/performed Overall Cognitive Status: Within Functional Limits for tasks assessed  General Comments: Pt crying upon arrival, upset regarding difficulty eating breakfast and self feeding, easy to calm and encourage. Pt momentarily upset regarding current mobility status, responds well to encouragement.  Exercises General Exercises - Lower Extremity Quad Sets: Seated;Strengthening;Both;10  reps Heel Slides: Seated;AROM;Strengthening;Both;10 reps Straight Leg Raises: Seated;AROM;Strengthening;Both;5 reps (partial AROM) Other Exercises Other Exercises: STS, 3 reps from recliner, VC for hand placement    General Comments        Pertinent Vitals/Pain Pain Assessment: No/denies pain    Home Living                      Prior Function            PT Goals (current goals can now be found in the care plan section) Acute Rehab PT Goals Patient Stated Goal: get better and be independent PT Goal Formulation: With patient Time For Goal Achievement: 01/25/21 Potential to Achieve Goals: Good Progress towards PT goals: Progressing toward goals    Frequency    Min 3X/week      PT Plan Current plan remains appropriate    Co-evaluation              AM-PAC PT "6 Clicks" Mobility   Outcome Measure  Help needed turning from your back to your side while in a flat bed without using bedrails?: A Little Help needed moving from lying on your back to sitting on the side of a flat bed without using bedrails?: A Little Help needed moving to and from a bed to a chair (including a wheelchair)?: A Little Help needed standing up from a chair using your arms (e.g., wheelchair or bedside chair)?: A Little Help needed to walk in hospital room?: A Lot Help needed climbing 3-5 steps with a railing? : Total 6 Click Score: 15    End of Session Equipment Utilized During Treatment: Gait belt Activity Tolerance: Patient tolerated treatment well;Patient limited by fatigue Patient left: in chair;with call bell/phone within reach;with chair alarm set;with nursing/sitter in room Nurse Communication: Mobility status;Other (comment) (emotional support) PT Visit Diagnosis: Muscle weakness (generalized) (M62.81);Difficulty in walking, not elsewhere classified (R26.2)     Time: 0948 ([id number], then returned after pt ate breakfast for ambulation/exercise 0948-1005 (26 minutes  total))-1005 PT Time Calculation (min) (ACUTE ONLY): 17 min  Charges:  $Gait Training: 8-22 mins $Therapeutic Activity: 8-22 mins                      Tori Lourdes Manning PT, DPT 01/12/21, 10:21 AM

## 2021-01-12 NOTE — Progress Notes (Signed)
Inpatient Rehabilitation Medication Review by a Pharmacist  A complete drug regimen review was completed for this patient to identify any potential clinically significant medication issues.  High Risk Drug Classes Is patient taking? Indication by Medication  Antipsychotic No   Anticoagulant No   Antibiotic Yes  SBP prophylaxis: chronic Cipro  Opioid No   Antiplatelet No   Hypoglycemics/insulin Yes Diabetes: Insulin aspart SSI, meal coverage  with Insulin aspart, and Insulin glargine-yfgn (SEMGLEE)  Vasoactive Medication No   Chemotherapy No   Other Yes  Hepatic encephalopathy: Lactulose H/o iron deficiency anemia:  Ferrous sulfate      Type of Medication Issue Identified Description of Issue Recommendation(s)  Drug Interaction(s) (clinically significant)     Duplicate Therapy     Allergy     No Medication Administration End Date     Incorrect Dose     Additional Drug Therapy Needed     Significant med changes from prior encounter (inform family/care partners about these prior to discharge).    Other       Clinically significant medication issues were identified that warrant physician communication and completion of prescribed/recommended actions by midnight of the next day:  No  Name of provider notified for urgent issues identified: n/a  Provider Method of Notification: n/a  Time spent performing this drug regimen review (minutes):  [place], [place] Clinical Pharmacist Please check AMION for all [facility] phone numbers After 10:00 PM, call [place] [phone]  01/12/2021 2:36 PM

## 2021-01-12 NOTE — Plan of Care (Signed)
Neurology plan of care:   We were waiting on MRI cspine results.   MRI cspine. 1. Degenerative changes of the cervical spine resulting in moderate to severe spinal canal stenosis at C4-5 and moderate at C5-6 and C6-7. There is mass effect on the cord at these levels, worse at C4-5 without cord signal abnormality. 2. Multilevel high-grade neural foraminal narrowing severe on the right at C4-5 and C5-6 and moderate on the left at C5-6. 3. Prominent T2 hyperintensity within the pons and less evident in the bilateral middle cerebellar peduncles suggestive of osmotic demyelination. This is best characterized on recent MRI of the brain performed January 10, 2021.  Confirms our suspicion of osmotic demyelination and there is no other workup or treatment to be done. Recommend NSU evaluation for multilevel high grade neural foraminal narrowing as above.   Neurology will be available for questions.   Meagan Boll, NP Neuro Discussed with Dr. Theda Sers.

## 2021-01-12 NOTE — H&P (Signed)
Physical Medicine and Rehabilitation Admission H&P   CC: Weakness  HPI: Meagan Peck is a 60 year old right-handed female with history significant of [name] cirrhosis with previous hepatic encephalopathy maintained on chronic Cipro, diabetes mellitus hypertension hyperlipidemia, esophageal varices/GERD.  Per chart review patient lives with spouse and family.  Two-level home able to live on the main level.  Needed some assist with ADLs she mostly sponge bathes.  Presented 01/08/2021 altered mental status and slurred speech.  Denied any chills or fever.  Admission chemistries unremarkable except glucose 348, AST 54, ammonia level 83, hemoglobin A1c 10.7, hemoglobin 9.2, urinalysis negative nitrite.  Cranial CT scan negative for acute changes.  MRI showed striking abnormal diffusion, T2 and flair signal abnormality symmetrically affecting the pons with sparing of the bilateral cortical infarcts favoring osmotic demyelination syndrome.  Relatively subtle signal changes also in the bilateral thalamus felt to be nonspecific.  Ultrasound the abdomen showed trace upper abdominal ascites and no plan for paracentesis.  MRI cervical spine degenerative changes moderate to severe spinal stenosis C4-5 and moderate C5-6 and C6-7.  There was some mass-effect on the cord at these levels worse at C4-5 without cord signal abnormality.  Latest ammonia level 139 continues on scheduled Chronulac.  Therapy evaluations completed due to patient decreased functional mobility was admitted for a comprehensive rehab program.  Pt reports LBM 1-2 days ago- not going 2-3x/day right now.  No pain lately except HA- which she doesn't have right now.  When has it, it improves with tylenol.  IV running Mg Sulfate-   Review of Systems  Constitutional:  Positive for malaise/fatigue. Negative for chills and fever.  HENT:  Negative for hearing loss.   Eyes:  Negative for blurred vision and double vision.  Respiratory:  Negative for  cough.        Dyspnea on exertion  Cardiovascular:  Positive for leg swelling. Negative for chest pain and palpitations.  Gastrointestinal:  Positive for constipation. Negative for heartburn, nausea and vomiting.       GERD  Genitourinary:  Negative for dysuria, flank pain and hematuria.  Musculoskeletal:  Positive for joint pain and myalgias.  Skin:  Negative for rash.  Psychiatric/Behavioral:  The patient has insomnia.   All other systems reviewed and are negative. Past Medical History:  Diagnosis Date   Acute medial meniscus tear of right knee 12/10/2015   Allergy    Anasarca    Arthritis    bil feet   Ascites    Cataract    bilateral - surgery to remove   Cirrhosis ([place])    Diabetes mellitus without complication ([place])    type 2 - last a1c was 5.2 per patient, no meds   Dyspnea    when I have too much fluid   Esophageal varices ([facility])    GERD (gastroesophageal reflux disease)    Heart murmur    Hepatic cirrhosis ([facility])    HLD (hyperlipidemia)    no meds   Hypertension    no meds   Iron deficiency anemia 09/26/2018   Past Surgical History:  Procedure Laterality Date   BIOPSY  08/20/2019   Procedure: BIOPSY;  Surgeon: Jackquline Denmark, MD;  Location: [facility];  Service: Gastroenterology;;   CARPAL TUNNEL RELEASE Right    CESAREAN SECTION     CHOLECYSTECTOMY     COLONOSCOPY  08/2019   ESOPHAGEAL BANDING N/A 09/19/2019   Procedure: ESOPHAGEAL BANDING;  Surgeon: Yetta Flock, MD;  [facility]: [facility];  Service: Gastroenterology;  Laterality: N/A;   ESOPHAGOGASTRODUODENOSCOPY (EGD) WITH PROPOFOL N/A 08/20/2019   Procedure: ESOPHAGOGASTRODUODENOSCOPY (EGD) WITH PROPOFOL;  Surgeon: Jackquline Denmark, MD;  Location: [facility];  Service: Gastroenterology;  Laterality: N/A;   ESOPHAGOGASTRODUODENOSCOPY (EGD) WITH PROPOFOL N/A 09/19/2019   Procedure: ESOPHAGOGASTRODUODENOSCOPY (EGD) WITH PROPOFOL;  Surgeon: Yetta Flock, MD;  [facility];  Service:  Gastroenterology;  Laterality: N/A;   ESOPHAGOGASTRODUODENOSCOPY (EGD) WITH PROPOFOL N/A 01/16/2020   Procedure: ESOPHAGOGASTRODUODENOSCOPY (EGD) WITH PROPOFOL;  Surgeon: Jerene Bears, MD;  Location: [facility];  Service: Endoscopy;  Laterality: N/A;   EYE SURGERY Bilateral    removed cataracts   GASTRIC VARICES BANDING N/A 08/20/2019   Procedure: GASTRIC VARICES BANDING;  Surgeon: Jackquline Denmark, MD;  Location: [facility];  Service: Gastroenterology;  Laterality: N/A;   HOT HEMOSTASIS N/A 01/16/2020   Procedure: HOT HEMOSTASIS (ARGON PLASMA COAGULATION/BICAP);  Surgeon: Jerene Bears, MD;  Location: [facility];  Service: Endoscopy;  Laterality: N/A;   IR PARACENTESIS  08/01/2019   IR PARACENTESIS  08/19/2019   IR PARACENTESIS  09/09/2019   IR PARACENTESIS  09/23/2019   IR PARACENTESIS  10/08/2019   IR PARACENTESIS  10/23/2019   IR PARACENTESIS  11/12/2019   IR PARACENTESIS  11/21/2019   IR PARACENTESIS  11/25/2019   IR PARACENTESIS  12/05/2019   IR PARACENTESIS  12/13/2019   IR PARACENTESIS  12/20/2019   IR PARACENTESIS  12/26/2019   IR PARACENTESIS  12/31/2019   IR PARACENTESIS  01/07/2020   IR PARACENTESIS  01/10/2020   IR PARACENTESIS  01/13/2020   IR PARACENTESIS  01/14/2020   IR PARACENTESIS  01/20/2020   IR PARACENTESIS  01/30/2020   IR PARACENTESIS  02/24/2020   IR RADIOLOGIST EVAL & MGMT  10/30/2019   IR RADIOLOGIST EVAL & MGMT  12/11/2019   IR RADIOLOGIST EVAL & MGMT  12/24/2019   IR RADIOLOGIST EVAL & MGMT  03/05/2020   IR RADIOLOGIST EVAL & MGMT  05/15/2020   IR TIPS  11/25/2019   IR TIPS  01/14/2020   KNEE ARTHROSCOPY Right 12/10/2015   Procedure: RIGHT KNEE ARTHROSCOPY WITH PARTIAL MEDIAL MENISCECTOMY;  Surgeon: Mcarthur Rossetti, MD;  Location: [facility];  Service: Orthopedics;  Laterality: Right;   KNEE ARTHROSCOPY W/ MENISCAL REPAIR Bilateral    MENISCUS REPAIR Left    RADIOLOGY WITH ANESTHESIA N/A 11/25/2019   Procedure: TIPS;  Surgeon: Corrie Mckusick, DO;  Location: [facility];  Service:  Anesthesiology;  Laterality: N/A;   RADIOLOGY WITH ANESTHESIA N/A 01/14/2020   Procedure: TIPS;  Surgeon: Corrie Mckusick, DO;  Location: [facility];  Service: Anesthesiology;  Laterality: N/A;   Family History  Problem Relation Age of Onset   Hypertension Mother    COPD Mother    Alcoholism Father    Heart attack Father    Diabetes Brother    Diabetes Paternal Grandmother    Heart disease Paternal Grandmother    Alcoholism Paternal Uncle    Alcoholism Maternal Uncle    Heart disease Maternal Grandmother    Heart disease Maternal Grandfather    Heart disease Paternal Grandfather    Social History:  reports that she has never smoked. She has never used smokeless tobacco. She reports that she does not drink alcohol and does not use drugs. Allergies:  Allergies  Allergen Reactions   Penicillins Anaphylaxis and Other (See Comments)    Happened as a young child   Atrovent [Ipratropium] Palpitations   Naproxen Rash   Quinine Derivatives Rash   Sulfa Antibiotics Rash  Medications Prior to Admission  Medication Sig Dispense Refill   acetaminophen (TYLENOL) 500 MG tablet Take 500-1,000 mg by mouth every 6 (six) hours as needed for moderate pain, headache or mild pain.     cetirizine (ZYRTEC) 10 MG tablet Take 1 tablet (10 mg total) by mouth daily. (Patient taking differently: Take 10 mg by mouth daily as needed for allergies.) 90 tablet 3   Cholecalciferol (VITAMIN D3) 5000 UNITS TABS Take 5,000 Units by mouth at bedtime.     ciprofloxacin (CIPRO) 500 MG tablet Take 500 mg by mouth daily with breakfast.     ferrous sulfate 325 (65 FE) MG EC tablet Take 1 tablet (325 mg total) by mouth 2 (two) times daily with a meal. 180 tablet 1   insulin degludec (TRESIBA) 200 UNIT/ML FlexTouch Pen INJECT 10 - 50 UNITS INTO THE SKIN DAILY. TARGET TO FASTING GLUCOSE 150. (Patient taking differently: Inject 20 Units into the skin daily after breakfast.) 15 mL 99   insulin lispro (HUMALOG) 200 UNIT/ML KwikPen  INJECT 10 - 15 UNITS INTO THE SKIN 3 TIMES DAILY BEFORE MEALS (Patient taking differently: Inject 20 Units into the skin 3 (three) times daily after meals.) 15 mL 1   Krill Oil 500 MG CAPS Take 1,000 mg by mouth 2 (two) times daily.     lactulose, encephalopathy, (CHRONULAC) 10 GM/15ML SOLN Take 30 mLs (20 g total) by mouth 2 (two) times daily. 236 mL 0   Multiple Vitamin (MULTI-DAY PO) Take 1 tablet by mouth daily.     pantoprazole (PROTONIX) 40 MG tablet TAKE 1 TABLET (40 MG TOTAL) BY MOUTH DAILY. (Patient taking differently: Take 40 mg by mouth daily before breakfast.) 90 tablet 3   ACCU-CHEK FASTCLIX LANCETS MISC Check fsbs TID     AMBULATORY NON FORMULARY MEDICATION Wedge pillow 1 each 0   Insulin Pen Needle 32G X 4 MM MISC USE TO INJECT TRESIBA 100 each 12   magnesium oxide (MAG-OX) 400 (240 Mg) MG tablet TAKE 1 TABLET BY MOUTH TWICE DAILY (Patient not taking: No sig reported) 120 tablet 1    Drug Regimen Review Drug regimen was reviewed and remains appropriate with no significant issues identified  Home: Home Living Family/patient expects to be discharged to:: Private residence Living Arrangements: Spouse/significant other, Children Available Help at Discharge: Family, Available 24 hours/day Type of Home: House Home Access: Stairs to enter Home Layout: Able to live on main level with bedroom/bathroom Bathroom Shower/Tub: Chiropodist: Standard Home Equipment: Cane - single point   Functional History: Prior Function Level of Independence: Needs assistance Gait / Transfers Assistance Needed: uses cane for ambulation. ADL's / Homemaking Assistance Needed: daughter helps with bathing,  Functional Status:  Mobility: Bed Mobility Overal bed mobility: Needs Assistance Bed Mobility: Supine to Sit Supine to sit: Min guard Sit to supine: Min guard, HOB elevated General bed mobility comments: Increased time. Relies on bedrail to before  transfers. Transfers Overall transfer level: Needs assistance Equipment used: Rolling walker (2 wheeled) Transfers: Sit to/from Stand Sit to Stand: Min assist Stand pivot transfers: Min assist General transfer comment: Assist to rise, steady, control descent. Wide BOS and unsteady.Cues for safety, technique, hand placement. Stand pivot, bed to recliner, using RW Ambulation/Gait Ambulation/Gait assistance: Mod assist Gait Distance (Feet): 5 Feet Assistive device: Rolling walker (2 wheeled) Gait Pattern/deviations: Wide base of support, Step-through pattern General Gait Details: Assist to stabilize/support pt and to manage RW. Some R LE ataxia observed. Pt remains unsteady and at  risk for falls. Ambulation distance limited by dizziness. Walked 5 feet forwards then backwards with RW. Pt reported some dizziness with ambulation on today.    ADL: ADL Overall ADL's : Needs assistance/impaired Eating/Feeding: Set up, Sitting Eating/Feeding Details (indicate cue type and reason): with difficulty due to impaired coordination Grooming: Set up, Sitting Upper Body Bathing: Set up, Sitting Lower Body Bathing: Moderate assistance, Sit to/from stand Upper Body Dressing : Set up, Sitting Upper Body Dressing Details (indicate cue type and reason): shirt without fasteners Lower Body Dressing: Sit to/from stand, Maximal assistance Toilet Transfer: Minimal assistance, BSC, Ambulation, RW Toileting- Clothing Manipulation and Hygiene: Sit to/from stand, Moderate assistance Toileting - Clothing Manipulation Details (indicate cue type and reason): patient assisted with clothing management but limited by poor balance and needing one arm on walker, patient able to wipe Tub/ Shower Transfer: Minimal assistance, Shower seat Functional mobility during ADLs: Minimal assistance, Rolling walker  Cognition: Cognition Overall Cognitive Status: Within Functional Limits for tasks assessed Orientation Level: Oriented  X4 Cognition Arousal/Alertness: Awake/alert Behavior During Therapy: WFL for tasks assessed/performed Overall Cognitive Status: Within Functional Limits for tasks assessed  Physical Exam: Blood pressure (!) 142/54, pulse 76, temperature 98.9 F (37.2 C), temperature source Oral, resp. rate 18, height 4' 10"  (1.473 m), weight 59 kg, last menstrual period 12/03/2013, SpO2 96 %. Physical Exam Vitals and nursing note reviewed. Exam conducted with a chaperone present.  Constitutional:      Comments: Pt laying supine in bed; nurses and husband in room; appears jaundiced and ill, NAD  HENT:     Head: Normocephalic and atraumatic.     Comments: No facial droop? Tongue midline    Right Ear: External ear normal.     Left Ear: External ear normal.     Nose: Nose normal.     Mouth/Throat:     Mouth: Mucous membranes are moist.     Pharynx: Oropharynx is clear. No oropharyngeal exudate.  Eyes:     General:        Right eye: No discharge.        Left eye: No discharge.     Extraocular Movements: Extraocular movements intact.  Cardiovascular:     Rate and Rhythm: Normal rate and regular rhythm.     Heart sounds: Normal heart sounds. No murmur heard.   No gallop.  Pulmonary:     Comments: CTA B/L- no W/R/R- good air movement    Abdominal:     Comments: Umbilical hernia- moderate Soft, NT, ND, (+)BS    Musculoskeletal:     Cervical back: Normal range of motion. No rigidity.     Comments: RUE 4-/5 esp biceps and FA LUE- 4+/5 RLE- all 4-/5 LLE- 4+/5   Skin:    Comments: No skin breakdown on exam R forearm IV- looks OK Jaundiced skin, esp on torso Abrasion R shin- small- trace LE edema to ankles  Neurological:     Comments: Patient is alert.  Mood is a bit flat.  Speech is a bit dysarthric but intelligible.  Makes eye contact with examiner provides name and age.  Limited but fair medical historian. Significant dysarthria- hard to understand at times- c/o hiccups No  Axterixis Intact to light touch in all 4 extremities  Psychiatric:        Behavior: Behavior normal.     Comments: Slightly delayed processing; flat affect    Results for orders placed or performed during the hospital encounter of 01/08/21 (from the past 48 hour(s))  Glucose, capillary     Status: Abnormal   Collection Time: 01/10/21 11:27 AM  Result Value Ref Range   Glucose-Capillary 435 (H) 70 - 99 mg/dL    Comment: Glucose reference range applies only to samples taken after fasting for at least 8 hours.  Glucose, capillary     Status: Abnormal   Collection Time: 01/10/21 12:27 PM  Result Value Ref Range   Glucose-Capillary 491 (H) 70 - 99 mg/dL    Comment: Glucose reference range applies only to samples taken after fasting for at least 8 hours.  Glucose, capillary     Status: Abnormal   Collection Time: 01/10/21  4:40 PM  Result Value Ref Range   Glucose-Capillary 420 (H) 70 - 99 mg/dL    Comment: Glucose reference range applies only to samples taken after fasting for at least 8 hours.  Glucose, capillary     Status: Abnormal   Collection Time: 01/10/21  8:24 PM  Result Value Ref Range   Glucose-Capillary 299 (H) 70 - 99 mg/dL    Comment: Glucose reference range applies only to samples taken after fasting for at least 8 hours.  Basic metabolic panel     Status: Abnormal   Collection Time: 01/11/21  4:47 AM  Result Value Ref Range   Sodium 143 135 - 145 mmol/L   Potassium 3.7 3.5 - 5.1 mmol/L   Chloride 110 98 - 111 mmol/L   CO2 23 22 - 32 mmol/L   Glucose, Bld 142 (H) 70 - 99 mg/dL    Comment: Glucose reference range applies only to samples taken after fasting for at least 8 hours.   BUN 14 6 - 20 mg/dL   Creatinine, Ser 0.76 0.44 - 1.00 mg/dL   Calcium 8.9 8.9 - 10.3 mg/dL   GFR, Estimated >60 >60 mL/min    Comment: (NOTE) Calculated using the CKD-EPI Creatinine Equation (2021)    Anion gap 10 5 - 15    Comment: Performed at [facility], [place]  [address], [place], [place] [address]  Glucose, capillary     Status: Abnormal   Collection Time: 01/11/21  8:22 AM  Result Value Ref Range   Glucose-Capillary 126 (H) 70 - 99 mg/dL    Comment: Glucose reference range applies only to samples taken after fasting for at least 8 hours.   Comment 1 Notify RN    Comment 2 Document in Chart   Glucose, capillary     Status: Abnormal   Collection Time: 01/11/21 12:26 PM  Result Value Ref Range   Glucose-Capillary 243 (H) 70 - 99 mg/dL    Comment: Glucose reference range applies only to samples taken after fasting for at least 8 hours.   Comment 1 Notify RN    Comment 2 Document in Chart   Glucose, capillary     Status: Abnormal   Collection Time: 01/11/21  4:43 PM  Result Value Ref Range   Glucose-Capillary 288 (H) 70 - 99 mg/dL    Comment: Glucose reference range applies only to samples taken after fasting for at least 8 hours.  Magnesium     Status: Abnormal   Collection Time: 01/12/21  4:24 AM  Result Value Ref Range   Magnesium 1.4 (L) 1.7 - 2.4 mg/dL    Comment: Performed at [facility], [place] [address], [place], [place] [address]  Comprehensive metabolic panel     Status: Abnormal   Collection Time: 01/12/21  4:24 AM  Result Value Ref  Range   Sodium 144 135 - 145 mmol/L   Potassium 3.7 3.5 - 5.1 mmol/L   Chloride 111 98 - 111 mmol/L   CO2 19 (L) 22 - 32 mmol/L   Glucose, Bld 124 (H) 70 - 99 mg/dL    Comment: Glucose reference range applies only to samples taken after fasting for at least 8 hours.   BUN 14 6 - 20 mg/dL   Creatinine, Ser 0.69 0.44 - 1.00 mg/dL   Calcium 9.7 8.9 - 10.3 mg/dL   Total Protein 5.7 (L) 6.5 - 8.1 g/dL   Albumin 2.9 (L) 3.5 - 5.0 g/dL   AST 102 (H) 15 - 41 U/L   ALT 45 (H) 0 - 44 U/L   Alkaline Phosphatase 120 38 - 126 U/L   Total Bilirubin 6.1 (H) 0.3 - 1.2 mg/dL   GFR, Estimated >60 >60 mL/min    Comment: (NOTE) Calculated using the CKD-EPI Creatinine Equation (2021)    Anion  gap 14 5 - 15    Comment: Performed at [facility], [place] [address], [place], [place] [address]  Glucose, capillary     Status: Abnormal   Collection Time: 01/12/21  8:03 AM  Result Value Ref Range   Glucose-Capillary 128 (H) 70 - 99 mg/dL    Comment: Glucose reference range applies only to samples taken after fasting for at least 8 hours.   Comment 1 Notify RN    MR CERVICAL SPINE WO CONTRAST  Result Date: 01/11/2021 CLINICAL DATA:  Demyelinating disease. EXAM: MRI CERVICAL SPINE WITHOUT CONTRAST TECHNIQUE: Multiplanar, multisequence MR imaging of the cervical spine was performed. No intravenous contrast was administered. COMPARISON:  None. FINDINGS: Alignment: Straightening of the cervical curvature. Vertebrae: No fracture, evidence of discitis, or bone lesion. Cord: Mass effect on the cord at the C4-5, C5-6 and C6-7 levels. No gross cord signal abnormality. Posterior Fossa, vertebral arteries, paraspinal tissues: Prominent T2 hyperintensity within the pons and in the bilateral middle cerebellar peduncles, with sparing of the bilateral cortical spinal tracts, better evaluated on recent MRI of the brain performed on January 10, 2021. Disc levels: C2-3: Small posterior disc protrusion and mild facet degenerative changes without significant spinal canal or neural foraminal stenosis. C3-4: Small posterior disc protrusion resulting in mild spinal canal stenosis. Uncovertebral and facet degenerative changes resulting in mild right neural foraminal narrowing. C4-5: Posterior disc osteophyte complex resulting in moderate to severe spinal canal stenosis with mass effect cord. Uncovertebral and facet degenerative changes resulting in severe right neural foraminal narrowing. C5-6: Posterior disc osteophyte complex and prominence of the ligamentum flavum resulting in moderate spinal canal stenosis with mass effect on the cord. Uncovertebral facet degenerative changes noting joint effusion in  the right facet joint. Findings result in severe right and moderate left neural foraminal narrowing. C6-7: Posterior disc osteophyte complex resulting in moderate spinal canal stenosis with mass effect the cord. Uncovertebral and facet degenerative changes resulting in mild bilateral neural foraminal narrowing. C7-T1: Posterior disc protrusion and prominence of the ligamentum flavum resulting in mild spinal canal stenosis. Facet degenerative changes without significant neural foraminal narrowing. IMPRESSION: 1. Degenerative changes of the cervical spine resulting in moderate to severe spinal canal stenosis at C4-5 and moderate at C5-6 and C6-7. There is mass effect on the cord at these levels, worse at C4-5 without cord signal abnormality. 2. Multilevel high-grade neural foraminal narrowing severe on the right at C4-5 and C5-6 and moderate on the left at C5-6. 3. Prominent T2 hyperintensity within  the pons and less evident in the bilateral middle cerebellar peduncles suggestive of osmotic demyelination. This is best characterized on recent MRI of the brain performed January 10, 2021. Electronically Signed   By: Pedro Earls M.D.   On: 01/11/2021 14:55       Medical Problem List and Plan: 1.  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  Continue scheduled Chronulac.PATIENT IS MAINTAINED ON CHRONIC CIPRO   -patient may  shower  -ELOS/Goals: 14-16 days min A to supervision 2.  Antithrombotics: -DVT/anticoagulation: SCDs Pharmaceutical: Other (comment)  -antiplatelet therapy: N/A 3. Pain Management: Tylenol as needed for HA 4. Mood: Provide emotional support  -antipsychotic agents: N/A 5. Neuropsych: This patient is capable of making decisions on her own behalf. 6. Skin/Wound Care: Routine skin checks 7. Fluids/Electrolytes/Nutrition: Routine in and outs with follow-up chemistries 8.   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  Neurology follow-up conservative care 9.  Diabetes mellitus.  Hemoglobin A1c 10.7.  NovoLog 12 units 3 times daily, Semglee 20 units twice daily.  Diabetic teaching- couldn't afford insulin at home.  10.  Acute on chronic anemia.  Continue iron supplement.  Follow-up CBC 11. Constipation- LBM yesterday- will get back going 2-3x/day for ammonia levels.  12. Hiccups- will need to d/w pharmacy if can do baclofen vs thorazine due to NASH.    Lavon Paganini Angiulli, PA-C 01/12/2021   I have personally performed a face to face diagnostic evaluation of this patient and formulated the key components of the plan.  Additionally, I have personally reviewed laboratory data, imaging studies, as well as relevant notes and concur with the physician assistant's documentation above.   The patient's status has not changed from the original H&P.  Any changes in documentation from the acute care chart have been noted above.

## 2021-01-13 ENCOUNTER — Other Ambulatory Visit (HOSPITAL_BASED_OUTPATIENT_CLINIC_OR_DEPARTMENT_OTHER): Payer: Self-pay

## 2021-01-13 DIAGNOSIS — D649 Anemia, unspecified: Secondary | ICD-10-CM

## 2021-01-13 DIAGNOSIS — D696 Thrombocytopenia, unspecified: Secondary | ICD-10-CM

## 2021-01-13 DIAGNOSIS — K7290 Hepatic failure, unspecified without coma: Secondary | ICD-10-CM

## 2021-01-13 DIAGNOSIS — E1165 Type 2 diabetes mellitus with hyperglycemia: Secondary | ICD-10-CM

## 2021-01-13 LAB — CBC WITH DIFFERENTIAL/PLATELET
Abs Immature Granulocytes: 0.05 10*3/uL (ref 0.00–0.07)
Basophils Absolute: 0.1 10*3/uL (ref 0.0–0.1)
Basophils Relative: 1 %
Eosinophils Absolute: 0.4 10*3/uL (ref 0.0–0.5)
Eosinophils Relative: 4 %
HCT: 31 % — ABNORMAL LOW (ref 36.0–46.0)
Hemoglobin: 11.5 g/dL — ABNORMAL LOW (ref 12.0–15.0)
Immature Granulocytes: 1 %
Lymphocytes Relative: 15 %
Lymphs Abs: 1.3 10*3/uL (ref 0.7–4.0)
MCH: 34.1 pg — ABNORMAL HIGH (ref 26.0–34.0)
MCHC: 37.1 g/dL — ABNORMAL HIGH (ref 30.0–36.0)
MCV: 92 fL (ref 80.0–100.0)
Monocytes Absolute: 0.6 10*3/uL (ref 0.1–1.0)
Monocytes Relative: 7 %
Neutro Abs: 6.1 10*3/uL (ref 1.7–7.7)
Neutrophils Relative %: 72 %
Platelets: 88 10*3/uL — ABNORMAL LOW (ref 150–400)
RBC: 3.37 MIL/uL — ABNORMAL LOW (ref 3.87–5.11)
RDW: 14.7 % (ref 11.5–15.5)
WBC: 8.6 10*3/uL (ref 4.0–10.5)
nRBC: 0 % (ref 0.0–0.2)

## 2021-01-13 LAB — COMPREHENSIVE METABOLIC PANEL
ALT: 41 U/L (ref 0–44)
AST: 78 U/L — ABNORMAL HIGH (ref 15–41)
Albumin: 2.1 g/dL — ABNORMAL LOW (ref 3.5–5.0)
Alkaline Phosphatase: 92 U/L (ref 38–126)
Anion gap: 4 — ABNORMAL LOW (ref 5–15)
BUN: 10 mg/dL (ref 6–20)
CO2: 23 mmol/L (ref 22–32)
Calcium: 8.6 mg/dL — ABNORMAL LOW (ref 8.9–10.3)
Chloride: 108 mmol/L (ref 98–111)
Creatinine, Ser: 0.86 mg/dL (ref 0.44–1.00)
GFR, Estimated: >60 mL/min (ref >60)
Glucose, Bld: 209 mg/dL — ABNORMAL HIGH (ref 70–99)
Potassium: 4.1 mmol/L (ref 3.5–5.1)
Sodium: 135 mmol/L (ref 135–145)
Total Bilirubin: 5.2 mg/dL — ABNORMAL HIGH (ref 0.3–1.2)
Total Protein: 4.3 g/dL — ABNORMAL LOW (ref 6.5–8.1)

## 2021-01-13 LAB — GLUCOSE, CAPILLARY
Glucose-Capillary: 221 mg/dL — ABNORMAL HIGH (ref 70–99)
Glucose-Capillary: 226 mg/dL — ABNORMAL HIGH (ref 70–99)
Glucose-Capillary: 224 mg/dL — ABNORMAL HIGH (ref 70–99)
Glucose-Capillary: 219 mg/dL — ABNORMAL HIGH (ref 70–99)

## 2021-01-13 MED ORDER — PROSOURCE PLUS PO LIQD
30.0000 mL | Freq: Two times a day (BID) | ORAL | Status: DC
  Administered 2021-01-14 – 2021-02-08 (×46): 30 mL via ORAL
  Filled 2021-01-13 (×40): qty 30

## 2021-01-13 NOTE — Progress Notes (Addendum)
PHYSICAL MEDICINE & REHABILITATION PROGRESS NOTE  Subjective/Complaints: Patient seen sitting up in bed this morning.  She states she slept well overnight.  States she is ready to begin therapies.  ROS: Denies CP, SOB, N/V/D  Objective: Vital Signs: Blood pressure (!) 143/53, pulse 89, temperature 99.5 F (37.5 C), temperature source Oral, resp. rate 16, height 4' 10"  (1.473 m), weight 62.2 kg, last menstrual period 12/03/2013, SpO2 98 %. MR CERVICAL SPINE WO CONTRAST  Result Date: 01/11/2021 CLINICAL DATA:  Demyelinating disease. EXAM: MRI CERVICAL SPINE WITHOUT CONTRAST TECHNIQUE: Multiplanar, multisequence MR imaging of the cervical spine was performed. No intravenous contrast was administered. COMPARISON:  None. FINDINGS: Alignment: Straightening of the cervical curvature. Vertebrae: No fracture, evidence of discitis, or bone lesion. Cord: Mass effect on the cord at the C4-5, C5-6 and C6-7 levels. No gross cord signal abnormality. Posterior Fossa, vertebral arteries, paraspinal tissues: Prominent T2 hyperintensity within the pons and in the bilateral middle cerebellar peduncles, with sparing of the bilateral cortical spinal tracts, better evaluated on recent MRI of the brain performed on January 10, 2021. Disc levels: C2-3: Small posterior disc protrusion and mild facet degenerative changes without significant spinal canal or neural foraminal stenosis. C3-4: Small posterior disc protrusion resulting in mild spinal canal stenosis. Uncovertebral and facet degenerative changes resulting in mild right neural foraminal narrowing. C4-5: Posterior disc osteophyte complex resulting in moderate to severe spinal canal stenosis with mass effect cord. Uncovertebral and facet degenerative changes resulting in severe right neural foraminal narrowing. C5-6: Posterior disc osteophyte complex and prominence of the ligamentum flavum resulting in moderate spinal canal stenosis with mass effect on the  cord. Uncovertebral facet degenerative changes noting joint effusion in the right facet joint. Findings result in severe right and moderate left neural foraminal narrowing. C6-7: Posterior disc osteophyte complex resulting in moderate spinal canal stenosis with mass effect the cord. Uncovertebral and facet degenerative changes resulting in mild bilateral neural foraminal narrowing. C7-T1: Posterior disc protrusion and prominence of the ligamentum flavum resulting in mild spinal canal stenosis. Facet degenerative changes without significant neural foraminal narrowing. IMPRESSION: 1. Degenerative changes of the cervical spine resulting in moderate to severe spinal canal stenosis at C4-5 and moderate at C5-6 and C6-7. There is mass effect on the cord at these levels, worse at C4-5 without cord signal abnormality. 2. Multilevel high-grade neural foraminal narrowing severe on the right at C4-5 and C5-6 and moderate on the left at C5-6. 3. Prominent T2 hyperintensity within the pons and less evident in the bilateral middle cerebellar peduncles suggestive of osmotic demyelination. This is best characterized on recent MRI of the brain performed January 10, 2021. Electronically Signed   By: Pedro Earls M.D.   On: 01/11/2021 14:55   Recent Labs    01/13/21 0509  WBC 8.6  HGB 11.5*  HCT 31.0*  PLT 88*   Recent Labs    01/12/21 0424 01/13/21 0509  NA 144 135  K 3.7 4.1  CL 111 108  CO2 19* 23  GLUCOSE 124* 209*  BUN 14 10  CREATININE 0.69 0.86  CALCIUM 9.7 8.6*    Intake/Output Summary (Last 24 hours) at 01/13/2021 1235 Last data filed at 01/13/2021 0739 Gross per 24 hour  Intake 360 ml  Output --  Net 360 ml        Physical Exam: BP (!) 143/53 (BP Location: Left Arm)   Pulse 89   Temp 99.5 F (37.5 C) (Oral)   Resp 16  Ht 4' 10"  (1.473 m)   Wt 62.2 kg   LMP 12/03/2013 (LMP Unknown)   SpO2 98%   BMI 28.66 kg/m  Constitutional: No distress . Vital signs  reviewed. HENT: Normocephalic.  Atraumatic. Eyes: Disconjugate gaze. No discharge. Cardiovascular: No JVD.  RRR.  + Murmur. Respiratory: Normal effort.  No stridor.  Bilateral clear to auscultation. GI: Non-distended.  BS +. Skin: Warm and dry.  Intact. Psych: Normal mood.  Normal behavior. Musc: No edema in extremities.  No tenderness in extremities. Neuro: Alert Dysarthria Motor: Grossly 4/5 throughout   Assessment/Plan: 1. Functional deficits which require 3+ hours per day of interdisciplinary therapy in a comprehensive inpatient rehab setting. Physiatrist is providing close team supervision and 24 hour management of active medical problems listed below. Physiatrist and rehab team continue to assess barriers to discharge/monitor patient progress toward functional and medical goals   Care Tool:  Bathing    Body parts bathed by patient: Right arm, Left arm, Chest, Abdomen, Right upper leg, Left upper leg, Face (simulated)   Body parts bathed by helper: Front perineal area, Buttocks, Right lower leg, Left lower leg     Bathing assist Assist Level: Maximal Assistance - Patient 24 - 49%     Upper Body Dressing/Undressing Upper body dressing   What is the patient wearing?: Pull over shirt    Upper body assist Assist Level: Moderate Assistance - Patient 50 - 74%    Lower Body Dressing/Undressing Lower body dressing      What is the patient wearing?: Underwear/pull up, Pants     Lower body assist Assist for lower body dressing: Total Assistance - Patient < 25%     Toileting Toileting    Toileting assist Assist for toileting: Moderate Assistance - Patient 50 - 74%     Transfers Chair/bed transfer  Transfers assist     Chair/bed transfer assist level: Moderate Assistance - Patient 50 - 74% (with HHA)     Locomotion Ambulation   Ambulation assist   Ambulation activity did not occur: Safety/medical concerns (pt became emotional, did not have time for further  mobility)          Walk 10 feet activity   Assist  Walk 10 feet activity did not occur: Safety/medical concerns        Walk 50 feet activity   Assist Walk 50 feet with 2 turns activity did not occur: Safety/medical concerns         Walk 150 feet activity   Assist Walk 150 feet activity did not occur: Safety/medical concerns         Walk 10 feet on uneven surface  activity   Assist Walk 10 feet on uneven surfaces activity did not occur: Safety/medical concerns         Wheelchair     Assist Is the patient using a wheelchair?: Yes Type of Wheelchair: Manual Wheelchair activity did not occur: Safety/medical concerns         Wheelchair 50 feet with 2 turns activity    Assist    Wheelchair 50 feet with 2 turns activity did not occur: Safety/medical concerns       Wheelchair 150 feet activity     Assist  Wheelchair 150 feet activity did not occur: Safety/medical concerns        Medical Problem List and Plan: 1.  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  Antithrombotics: -DVT/anticoagulation: SCDs Pharmaceutical: Other (comment)             -antiplatelet therapy: N/A 3. Pain Management: Tylenol as needed for HA 4. Mood: Provide emotional support             -antipsychotic agents: N/A 5. Neuropsych: This patient is?  Fully capable of making decisions on her own behalf. 6. Skin/Wound Care: Routine skin checks 7. Fluids/Electrolytes/Nutrition: Routine in and outs 8.  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  Neurology follow-up conservative care 9.  Uncontrolled diabetes mellitus with hyperglycemia.  Hemoglobin A1c 10.7.  NovoLog 12 units 3 times daily, Semglee 20 units twice daily.  Diabetic teaching- couldn't afford insulin  at home.   Monitor with increased mobility 10.  Acute on chronic anemia.  Continue iron supplement.    Hemoglobin 11.5 on 9/14  Continue to monitor 11. Constipation: Needs to go 2-3x/day for ammonia levels.  12. Hiccups- will need to d/w pharmacy if can do baclofen vs thorazine due to NASH 13.  Thrombocytopenia  Platelets 88 on 9/14  Continue to monitor   LOS: 1 days A FACE TO FACE EVALUATION WAS PERFORMED  Jamarii Banks Lorie Phenix 01/13/2021, 12:35 PM

## 2021-01-13 NOTE — Evaluation (Signed)
Physical Therapy Assessment and Plan  Patient Details  Name: Meagan Peck MRN: [id number] Date of Birth: Feb 12, 1961  PT Diagnosis: Abnormality of gait, Difficulty walking, and Muscle weakness Rehab Potential: Good ELOS: 18-21   Today's Date: 01/13/2021 PT Individual Time: 6720-9470 PT Individual Time Calculation (min): 45 min    Hospital Problem: Principal Problem:   Demyelinating disease of central nervous system, unspecified ([place]) Active Problems:   Hepatic encephalopathy ([place])   Past Medical History:  Past Medical History:  Diagnosis Date   Acute medial meniscus tear of right knee 12/10/2015   Allergy    Anasarca    Arthritis    bil feet   Ascites    Cataract    bilateral - surgery to remove   Cirrhosis ([place])    Diabetes mellitus without complication ([place])    type 2 - last a1c was 5.2 per patient, no meds   Dyspnea    when I have too much fluid   Esophageal varices ([facility])    GERD (gastroesophageal reflux disease)    Heart murmur    Hepatic cirrhosis ([facility])    HLD (hyperlipidemia)    no meds   Hypertension    no meds   Iron deficiency anemia 09/26/2018   Past Surgical History:  Past Surgical History:  Procedure Laterality Date   BIOPSY  08/20/2019   Procedure: BIOPSY;  Surgeon: Jackquline Denmark, MD;  Location: [place];  Service: Gastroenterology;;   CARPAL TUNNEL RELEASE Right    CESAREAN SECTION     CHOLECYSTECTOMY     COLONOSCOPY  08/2019   ESOPHAGEAL BANDING N/A 09/19/2019   Procedure: ESOPHAGEAL BANDING;  Surgeon: Yetta Flock, MD;  Location: [facility];  Service: Gastroenterology;  Laterality: N/A;   ESOPHAGOGASTRODUODENOSCOPY (EGD) WITH PROPOFOL N/A 08/20/2019   Procedure: ESOPHAGOGASTRODUODENOSCOPY (EGD) WITH PROPOFOL;  Surgeon: Jackquline Denmark, MD;  Location: [place];  Service: Gastroenterology;  Laterality: N/A;   ESOPHAGOGASTRODUODENOSCOPY (EGD) WITH PROPOFOL N/A 09/19/2019   Procedure: ESOPHAGOGASTRODUODENOSCOPY (EGD) WITH PROPOFOL;   Surgeon: Yetta Flock, MD;  [facility];  Service: Gastroenterology;  Laterality: N/A;   ESOPHAGOGASTRODUODENOSCOPY (EGD) WITH PROPOFOL N/A 01/16/2020   Procedure: ESOPHAGOGASTRODUODENOSCOPY (EGD) WITH PROPOFOL;  Surgeon: Jerene Bears, MD;  Location: [facility];  Service: Endoscopy;  Laterality: N/A;   EYE SURGERY Bilateral    removed cataracts   GASTRIC VARICES BANDING N/A 08/20/2019   Procedure: GASTRIC VARICES BANDING;  Surgeon: Jackquline Denmark, MD;  Location: [place];  Service: Gastroenterology;  Laterality: N/A;   HOT HEMOSTASIS N/A 01/16/2020   Procedure: HOT HEMOSTASIS (ARGON PLASMA COAGULATION/BICAP);  Surgeon: Jerene Bears, MD;  Location: [facility];  Service: Endoscopy;  Laterality: N/A;   IR PARACENTESIS  08/01/2019   IR PARACENTESIS  08/19/2019   IR PARACENTESIS  09/09/2019   IR PARACENTESIS  09/23/2019   IR PARACENTESIS  10/08/2019   IR PARACENTESIS  10/23/2019   IR PARACENTESIS  11/12/2019   IR PARACENTESIS  11/21/2019   IR PARACENTESIS  11/25/2019   IR PARACENTESIS  12/05/2019   IR PARACENTESIS  12/13/2019   IR PARACENTESIS  12/20/2019   IR PARACENTESIS  12/26/2019   IR PARACENTESIS  12/31/2019   IR PARACENTESIS  01/07/2020   IR PARACENTESIS  01/10/2020   IR PARACENTESIS  01/13/2020   IR PARACENTESIS  01/14/2020   IR PARACENTESIS  01/20/2020   IR PARACENTESIS  01/30/2020   IR PARACENTESIS  02/24/2020   IR RADIOLOGIST EVAL & MGMT  10/30/2019   IR RADIOLOGIST EVAL & MGMT  12/11/2019   IR RADIOLOGIST EVAL & MGMT  12/24/2019   IR RADIOLOGIST EVAL & MGMT  03/05/2020   IR RADIOLOGIST EVAL & MGMT  05/15/2020   IR TIPS  11/25/2019   IR TIPS  01/14/2020   KNEE ARTHROSCOPY Right 12/10/2015   Procedure: RIGHT KNEE ARTHROSCOPY WITH PARTIAL MEDIAL MENISCECTOMY;  Surgeon: Mcarthur Rossetti, MD;  Location: [facility];  Service: Orthopedics;  Laterality: Right;   KNEE ARTHROSCOPY W/ MENISCAL REPAIR Bilateral    MENISCUS REPAIR Left    RADIOLOGY WITH ANESTHESIA N/A 11/25/2019    Procedure: TIPS;  Surgeon: Corrie Mckusick, DO;  Location: [place];  Service: Anesthesiology;  Laterality: N/A;   RADIOLOGY WITH ANESTHESIA N/A 01/14/2020   Procedure: TIPS;  Surgeon: Corrie Mckusick, DO;  Location: [place];  Service: Anesthesiology;  Laterality: N/A;    Assessment & Plan Clinical Impression: Meagan Peck is a 60 year old right-handed female with history significant of [name] cirrhosis with previous hepatic encephalopathy maintained on chronic Cipro, diabetes mellitus hypertension hyperlipidemia, esophageal varices/GERD.  Per chart review patient lives with spouse and family.  Two-level home able to live on the main level.  Needed some assist with ADLs she mostly sponge bathes.  Presented 01/08/2021 altered mental status and slurred speech.  Denied any chills or fever.  Admission chemistries unremarkable except glucose 348, AST 54, ammonia level 83, hemoglobin A1c 10.7, hemoglobin 9.2, urinalysis negative nitrite.  Cranial CT scan negative for acute changes.  MRI showed striking abnormal diffusion, T2 and flair signal abnormality symmetrically affecting the pons with sparing of the bilateral cortical infarcts favoring osmotic demyelination syndrome.  Relatively subtle signal changes also in the bilateral thalamus felt to be nonspecific.  Ultrasound the abdomen showed trace upper abdominal ascites and no plan for paracentesis.  MRI cervical spine degenerative changes moderate to severe spinal stenosis C4-5 and moderate C5-6 and C6-7.  There was some mass-effect on the cord at these levels worse at C4-5 without cord signal abnormality.  Latest ammonia level 139 continues on scheduled Chronulac.  Therapy evaluations completed due to patient decreased functional mobility was admitted for a comprehensive rehab program. Patient transferred to CIR on 01/12/2021 .   Patient currently requires mod with mobility secondary to muscle weakness, decreased cardiorespiratoy endurance, impaired timing and sequencing,  abnormal tone, decreased coordination, and decreased motor planning, and decreased sitting balance, decreased postural control, and decreased balance strategies.  Prior to hospitalization, patient was modified independent  with mobility and lived with Spouse, Daughter in a House home.  Home access is 1+1+1 then 2Stairs to enter.  Patient will benefit from skilled PT intervention to maximize safe functional mobility, minimize fall risk, and decrease caregiver burden for planned discharge home with 24 hour supervision.  Anticipate patient will benefit from follow up [place] at discharge.  PT - End of Session Activity Tolerance: Tolerates 10 - 20 min activity with multiple rests Endurance Deficit: Yes Endurance Deficit Description: Pt became fatigued quickly and requires frequent rest breaks PT Assessment Rehab Potential (ACUTE/IP ONLY): Good PT Barriers to Discharge: Inaccessible home environment;Decreased caregiver support PT Patient demonstrates impairments in the following area(s): Balance;Safety;Endurance;Motor PT Transfers Functional Problem(s): Bed Mobility;Bed to Chair;Car;Furniture PT Locomotion Functional Problem(s): Ambulation;Wheelchair Mobility;Stairs PT Plan PT Intensity: Minimum of 1-2 x/day ,45 to 90 minutes PT Frequency: 5 out of 7 days PT Duration Estimated Length of Stay: 18-21 PT Treatment/Interventions: Ambulation/gait training;Discharge planning;DME/adaptive equipment instruction;Functional mobility training;Pain management;Psychosocial support;Therapeutic Activities;UE/LE Strength taining/ROM;Community reintegration;Balance/vestibular training;Neuromuscular re-education;Patient/family Dealer;Therapeutic Exercise;UE/LE Coordination activities;Wheelchair propulsion/positioning PT Transfers  Anticipated Outcome(s): supervision with LRAD PT Locomotion Anticipated Outcome(s): CGA gait PT Recommendation Follow Up Recommendations: Home health PT Patient destination:  Home Equipment Recommended: Rolling walker with 5" wheels   PT Evaluation Precautions/Restrictions Precautions Precautions: Fall Restrictions Weight Bearing Restrictions: No General   Vital Signs Pain Pain Assessment Pain Scale: 0-10 Pain Score: 6  Pain Location: Head Pain Interference Pain Interference Pain Effect on Sleep: 1. Rarely or not at all Pain Interference with Therapy Activities: 1. Rarely or not at all Pain Interference with Day-to-Day Activities: 1. Rarely or not at all Home Living/Prior Onward Available Help at Discharge: Family;Available 24 hours/day Type of Home: House Home Access: Stairs to enter [employer] of Steps: 1+1+1 then 2 Entrance Stairs-Rails: None Home Layout: Able to live on main level with bedroom/bathroom  Lives With: Spouse;Daughter Prior Function Level of Independence: Requires assistive device for independence  Able to Take Stairs?: No Driving: No Vocation: Other (Comment) Comments: Spouse and daughter complete majority of meal prep and home management. Vision/Perception  Vision - Assessment Additional Comments: Spouse and daughter complete majority of meal prep and home management. Perception Perception: Within Functional Limits Praxis Praxis: Intact  Cognition Overall Cognitive Status: Within Functional Limits for tasks assessed Orientation Level: Oriented X4 Year: 2022 Month: September Day of Week: Correct Attention: Focused;Sustained Focused Attention: Appears intact Sustained Attention: Appears intact Memory: Appears intact Awareness: Appears intact Problem Solving: Appears intact Safety/Judgment: Appears intact Sensation Sensation Light Touch: Appears Intact Hot/Cold: Appears Intact Motor  Motor Motor: Hemiplegia Motor - Skilled Clinical Observations: Pt presents with R>L sided weakness   Trunk/Postural Assessment  Cervical Assessment Cervical Assessment: Exceptions to [facility] (poor  postural control) Thoracic Assessment Thoracic Assessment: Exceptions to [facility] (poor postural control, rounded shoulders) Lumbar Assessment Lumbar Assessment: Exceptions to WFL (PPP) Postural Control Postural Control: Deficits on evaluation (Pt had extreme difficulty maintaining upright sitting on eval)  Balance Balance Balance Assessed: Yes Static Sitting Balance Static Sitting - Balance Support: Bilateral upper extremity supported;Feet supported Static Sitting - Level of Assistance: 3: Mod assist Static Standing Balance Static Standing - Balance Support: Bilateral upper extremity supported Static Standing - Level of Assistance: 4: Min assist Dynamic Standing Balance Dynamic Standing - Balance Support: Right upper extremity supported Dynamic Standing - Level of Assistance: 4: Min assist Extremity Assessment      RLE Strength Right Hip Flexion: 2-/5 Right Knee Flexion: 4/5 Right Knee Extension: 3-/5 Right Ankle Dorsiflexion: 4/5 Right Ankle Plantar Flexion: 4/5 LLE Assessment LLE Assessment: Exceptions to [facility] LLE Strength Left Hip Flexion: 4/5 Left Knee Flexion: 4/5 Left Knee Extension: 4/5 Left Ankle Dorsiflexion: 4/5 Left Ankle Plantar Flexion: 4/5  Care Tool Care Tool Bed Mobility Roll left and right activity   Roll left and right assist level: Moderate Assistance - Patient 50 - 74%    Sit to lying activity   Sit to lying assist level: Contact Guard/Touching assist    Lying to sitting on side of bed activity   Lying to sitting on side of bed assist level: the ability to move from lying on the back to sitting on the side of the bed with no back support.: Maximal Assistance - Patient 25 - 49%     Care Tool Transfers Sit to stand transfer   Sit to stand assist level: Minimal Assistance - Patient > 75%    Chair/bed transfer   Chair/bed transfer assist level: Moderate Assistance - Patient 50 - 74% (with HHA)     Toilet transfer  Scientist, product/process development transfer  activity did not occur: Safety/medical concerns        Care Tool Locomotion Ambulation Ambulation activity did not occur: Safety/medical concerns (pt became emotional, did not have time for further mobility)        Walk 10 feet activity Walk 10 feet activity did not occur: Safety/medical concerns       Walk 50 feet with 2 turns activity Walk 50 feet with 2 turns activity did not occur: Safety/medical concerns      Walk 150 feet activity Walk 150 feet activity did not occur: Safety/medical concerns      Walk 10 feet on uneven surfaces activity Walk 10 feet on uneven surfaces activity did not occur: Safety/medical concerns      Stairs Stair activity did not occur: Safety/medical concerns        Walk up/down 1 step activity Walk up/down 1 step or curb (drop down) activity did not occur: Safety/medical concerns     Walk up/down 4 steps activity did not occuR: Safety/medical concerns  Walk up/down 4 steps activity      Walk up/down 12 steps activity Walk up/down 12 steps activity did not occur: Safety/medical concerns      Pick up small objects from floor Pick up small object from the floor (from standing position) activity did not occur: Safety/medical concerns      Wheelchair Is the patient using a wheelchair?: Yes Type of Wheelchair: Manual Wheelchair activity did not occur: Safety/medical concerns      Wheel 50 feet with 2 turns activity Wheelchair 50 feet with 2 turns activity did not occur: Safety/medical concerns    Wheel 150 feet activity Wheelchair 150 feet activity did not occur: Safety/medical concerns      Refer to Care Plan for Long Term Goals  SHORT TERM GOAL WEEK 1 PT Short Term Goal 1 (Week 1): Pt will initiate gait training PT Short Term Goal 2 (Week 1): Pt will transfer with min A or better consistently PT Short Term Goal 3 (Week 1): Pt will maintain sitting balance without UE suppoty x 2 min  Recommendations for other services: None   Skilled  Therapeutic Intervention Mobility Bed Mobility Bed Mobility: Rolling Right;Supine to Sit;Sit to Supine Rolling Right: Moderate Assistance - Patient 50-74% Right Sidelying to Sit: Maximal Assistance - Patient 25-49% Supine to Sit: Contact Guard/Touching assist Sit to Supine: Maximal Assistance - Patient 25-49% Transfers Transfers: Sit to Stand;Stand Pivot Transfers Sit to Stand: Minimal Assistance - Patient > 75% Stand Pivot Transfers: Moderate Assistance - Patient 50 - 74% Stand Pivot Transfer Details: Tactile cues for placement;Tactile cues for weight shifting;Verbal cues for technique;Verbal cues for gait pattern;Manual facilitation for weight shifting;Verbal cues for safe use of DME/AE;Manual facilitation for placement;Tactile cues for posture Stand Pivot Transfer Details (indicate cue type and reason): Pt became emotional during first attempt and required total A to sit, mod A for 2nd attempt Transfer (Assistive device): 1 person hand held assist Locomotion  Gait Ambulation: No Gait Distance (Feet): 0 Feet Gait Gait: No Stairs / Additional Locomotion Stairs: No Wheelchair Mobility Wheelchair Mobility: No  Evaluation completed (see details above and below) with education on PT POC and goals and individual treatment initiated with focus on  initiating functional mobility. pt received in bed and agreeable to therapy. Pt reports headache that improves with tylenol, therapy to tolerance. Pt required max A for supine<>sit from flat bed without bed rails. Pt demoes poor postural control and required upper extremity support  and min-mod A to sit EOB. Strength and sensation testing were performed as documented above. Stand pivot transfer with RW, pt required min A to stand and initiate transfer. She became emotional half way through transfer and was unable to come upright from slouched posture, required tot A to get to chair. Pt was emotional and cried for several minutes. Pt returned to bed  with mod A Stand pivot transfer with HHA. Pt demoed improved confidence and coordination with HHA. Pt returned to supine with CGA and was left with all needs in reach and alarm active.    Discharge Criteria: Patient will be discharged from PT if patient refuses treatment 3 consecutive times without medical reason, if treatment goals not met, if there is a change in medical status, if patient makes no progress towards goals or if patient is discharged from hospital.  The above assessment, treatment plan, treatment alternatives and goals were discussed and mutually agreed upon: by patient  Mickel Fuchs 01/13/2021, 12:35 PM

## 2021-01-13 NOTE — Progress Notes (Signed)
Occupational Therapy Session Note  Patient Details  Name: Meagan Peck MRN: [id number] Date of Birth: 1961/04/13  Today's Date: 01/13/2021 OT Individual Time: 8016-5537 OT Individual Time Calculation (min): 50 min    Short Term Goals: Week 1:  OT Short Term Goal 1 (Week 1): Pt will complete BSC/toilet transfer Min A of 1 consistently OT Short Term Goal 2 (Week 1): Pt will perform LB dress at sit <> stand Mod A with AE PRN OT Short Term Goal 3 (Week 1): Pt will perform self feeding with AE PRN with Min A  Skilled Therapeutic Interventions/Progress Updates:  Patient met toward end of lunch meal in long sitting. Patient in agreement with OT treatment session. Patient reporting mild pain at back of head rest. NT present in room reports giving patient AE including adaptive plate, weighted and lidded cup with handle, u-cuff and hand/wrist support. Patient appears to have significant RUE weakness (R hand dominant at baseline), decreased coordination, motor planning deficits and ataxia. Will continue to assess self-feeding in subsequent treatment sessions to determine appropriate AE use. Supine to EOB with Min A at trunk. Min A for anterior scoot toward EOB. Patient requires more than increased time for all tasks observed this date including bed mobility, functional transfers, mobility and ADLs. Sit to stand from EOB with Min A and HHA. Functional mobility to sink in prep for grooming with Mod A and cues for sequencing steps. Patient with difficulty progressing RLE>LLE. Multimodal cues for upright posture and foot placement. Seated at sink level patient able to grasp toothbrush and toothpaste but required external assist to open toothpaste squeeze onto toothbrush. Able to manage faucet without external assists and use of LUE. With built-up foam on toothbrush, patient able to initiation oral hygiene task but demonstrates great difficulty secondary to R-sided weakness, decreased motor planning and ataxia.  Functional mobility to commode in bathroom with Mod A in prep for toileting task. Min A for stand-pivot to standard height commode with use of grab bar and cues for sequencing/safety. Brief soiled of urine but patient reporting need to have BM. Mod A for hygiene and to doff soiled clothing and don fresh brief. Returned to wc in same manner as noted above. Min A for stand-pivot to EOB and Min guard for return to supine. Session concluded with patient lying supine in bed with call bell within reach, bed alarm activated and all needs met.   Therapy Documentation Precautions:  Restrictions Weight Bearing Restrictions: No General:    Therapy/Group: Individual Therapy  Rudine Rieger R [place]-Davis 01/13/2021, 9:52 AM

## 2021-01-13 NOTE — Progress Notes (Signed)
Initial Nutrition Assessment  DOCUMENTATION CODES:   Not applicable  INTERVENTION:  Provide 30 ml Prosource Plus po BID, each supplement provides 100 kcal and 15 grams of protein.   Encourage adequate PO intake.   NUTRITION DIAGNOSIS:   Increased nutrient needs related to chronic illness (NASH cirrhosis) as evidenced by estimated needs.  GOAL:   Patient will meet greater than or equal to 90% of their needs  MONITOR:   PO intake, Supplement acceptance, Skin, Weight trends, Labs, I & O's  REASON FOR ASSESSMENT:   Malnutrition Screening Tool    ASSESSMENT:   60 year old right-handed female with history significant of [name] cirrhosis with previous hepatic encephalopathy maintained on chronic Cipro, diabetes mellitus hypertension hyperlipidemia, esophageal varices/GERD. Presented 01/08/2021 altered mental status and slurred speech. MRI showed striking abnormal diffusion, T2 and flair signal abnormality symmetrically affecting the pons with sparing of the bilateral cortical infarcts favoring osmotic demyelination syndrome. 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  Pt unavailable during attempted time of contact. Meal completion 75-100%. RD to order nutritional supplements to aid in caloric and protein needs. Unable to complete Nutrition-Focused physical exam at this time. Labs and medications reviewed.   Diet Order:   Diet Order             Diet Carb Modified Fluid consistency: Thin; Room service appropriate? Yes  Diet effective now                   EDUCATION NEEDS:   Not appropriate for education at this time  Skin:  Skin Assessment: Reviewed RN Assessment  Last BM:  9/14  Height:   Ht Readings from Last 1 Encounters:  01/12/21 4' 10"  (1.473 m)    Weight:   Wt Readings from Last 1 Encounters:  01/12/21 62.2 kg   BMI:  Body mass index is 28.66  kg/m.  Estimated Nutritional Needs:   Kcal:  1700-1900  Protein:  80-95 grams  Fluid:  >/= 1.7 L/day  [name], MS, RD, LDN RD pager number/after hours weekend pager number on Amion.

## 2021-01-13 NOTE — IPOC Note (Signed)
Individualized overall Plan of Care [facility]) Patient Details Name: UNDINE NEALIS MRN: [id number] DOB: Feb 10, 1961  Admitting Diagnosis: Demyelinating disease of central nervous system, unspecified [facility])  Hospital Problems: Principal Problem:   Demyelinating disease of central nervous system, unspecified ([place]) Active Problems:   Hepatic encephalopathy ([place])   Acute on chronic anemia   Uncontrolled type 2 diabetes mellitus with hyperglycemia ([place])     Functional Problem List: Nursing Edema, Endurance, Medication Management, Nutrition, Pain, Safety  PT Balance, Safety, Endurance, Motor  OT Balance, Endurance, Motor, Pain, Safety, Perception  SLP Motor, Nutrition  TR         Basic ADL's: OT Eating, Grooming, Bathing, Dressing, Toileting     Advanced  ADL's: OT       Transfers: PT Bed Mobility, Bed to Chair, Car, Manufacturing systems engineer, Metallurgist: PT Ambulation, Emergency planning/management officer, Stairs     Additional Impairments: OT Fuctional Use of Upper Extremity  SLP Swallowing, Communication expression    TR      Anticipated Outcomes Item Anticipated Outcome  Self Feeding Set up  Swallowing  mod I   Basic self-care  Media planner Transfers Supervision  Bowel/Bladder  n/a  Transfers  supervision with LRAD  Locomotion  CGA gait  Communication  mod I  Cognition  N/A  Pain  < 3  Safety/Judgment  supervision and no falls   Therapy Plan: PT Intensity: Minimum of 1-2 x/day ,45 to 90 minutes PT Frequency: 5 out of 7 days PT Duration Estimated Length of Stay: 18-21 OT Intensity: Minimum of 1-2 x/day, 45 to 90 minutes OT Frequency: 5 out of 7 days OT Duration/Estimated Length of Stay: 18-20 days SLP Intensity: Minumum of 1-2 x/day, 30 to 90 minutes SLP Frequency: 3 to 5 out of 7 days SLP Duration/Estimated Length of Stay: 18-20 days (may be shorter for ST)    Team Interventions: Nursing Interventions  Patient/Family Education, Disease Management/Prevention, Pain Management, Medication Management, Discharge Planning  PT interventions Ambulation/gait training, Discharge planning, DME/adaptive equipment instruction, Functional mobility training, Pain management, Psychosocial support, Therapeutic Activities, UE/LE Strength taining/ROM, Community reintegration, Training and development officer, Brewing technologist, Barrister's clerk education, IT trainer, Therapeutic Exercise, UE/LE Coordination activities, Wheelchair propulsion/positioning  OT Interventions Training and development officer, Discharge planning, Pain management, Self Care/advanced ADL retraining, Therapeutic Activities, UE/LE Coordination activities, Visual/perceptual remediation/compensation, Therapeutic Exercise, Skin care/wound managment, Patient/family education, Functional mobility training, Disease mangement/prevention, Cognitive remediation/compensation, Academic librarian, Engineer, drilling, Neuromuscular re-education, Psychosocial support, Splinting/orthotics, UE/LE Strength taining/ROM, Wheelchair propulsion/positioning  SLP Interventions Dysphagia/aspiration precaution training, Speech/Language facilitation, Patient/family education  TR Interventions    SW/CM Interventions Discharge Planning, Psychosocial Support, Patient/Family Education   Barriers to Discharge MD  Medical stability and Medication compliance  Nursing Decreased caregiver support, Home environment access/layout, Lack of/limited family support, Insurance for SNF coverage, Weight, Medication compliance, Nutrition means Lives with spouse in 1 level home with 3-4 steps to enter. Spouse can provide supervision at discharge.  PT Inaccessible home environment, Decreased caregiver support    OT      SLP Decreased caregiver support patient reported her husband had a TBI and has some STM issues, ambulates with cane because of foot drop  SW Decreased  caregiver support, Lack of/limited family support, Other (comments) Pt is uninsured   Team Discharge Planning: Destination: PT-Home ,OT- Home , SLP-Home Projected Follow-up: PT-Home health PT, OT-  Home health OT, SLP-Other (comment) (TBD) Projected Equipment Needs: PT-Rolling walker with 5" wheels, OT- To be determined,  SLP-None recommended by SLP Equipment Details: PT- , OT-  Patient/family involved in discharge planning: PT-  ,  OT-Patient, SLP-Patient  MD ELOS: 17-20 days. Medical Rehab Prognosis:  Good Assessment: 60 year old right-handed female with history significant of [name] cirrhosis with previous hepatic encephalopathy maintained on chronic Cipro, diabetes mellitus hypertension hyperlipidemia, esophageal varices/GERD.  Two-level home able to live on the main level.  Needed some assist with ADLs she mostly sponge bathes.  Presented 01/08/2021 altered mental status and slurred speech.  Denied any chills or fever.  Admission chemistries unremarkable except glucose 348, AST 54, ammonia level 83, hemoglobin A1c 10.7, hemoglobin 9.2, urinalysis negative nitrite.  Cranial CT scan negative for acute changes.  MRI showed striking abnormal diffusion, T2 and flair signal abnormality symmetrically affecting the pons with sparing of the bilateral cortical infarcts favoring osmotic demyelination syndrome.  Relatively subtle signal changes also in the bilateral thalamus felt to be nonspecific.  Ultrasound the abdomen showed trace upper abdominal ascites and no plan for paracentesis.  MRI cervical spine degenerative changes moderate to severe spinal stenosis C4-5 and moderate C5-6 and C6-7.  There was some mass-effect on the cord at these levels worse at C4-5 without cord signal abnormality.  Latest ammonia level 139 continues on scheduled Chronulac.  Therapy evaluations completed due to patient decreased functional mobility and deficits in cognition and was admitted for a comprehensive rehab program. Will set  goals for Mod I/Supervision with PT/OT/SLP.  Due to the current state of emergency, patients may not be receiving their 3-hours of Medicare-mandated therapy.  See Team Conference Notes for weekly updates to the plan of care

## 2021-01-13 NOTE — Evaluation (Signed)
Speech Language Pathology Assessment and Plan  Patient Details  Name: Meagan Peck MRN: [id number] Date of Birth: 07/09/1960  SLP Diagnosis: Dysarthria;Dysphagia  Rehab Potential: Excellent ELOS: 18-20 days (may be shorter for ST)    Today's Date: 01/13/2021 SLP Individual Time: 1430-1530 SLP Individual Time Calculation (min): 22 min   Hospital Problem: Principal Problem:   Demyelinating disease of central nervous system, unspecified ([place]) Active Problems:   Hepatic encephalopathy ([place])   Acute on chronic anemia   Uncontrolled type 2 diabetes mellitus with hyperglycemia ([place])  Past Medical History:  Past Medical History:  Diagnosis Date   Acute medial meniscus tear of right knee 12/10/2015   Allergy    Anasarca    Arthritis    bil feet   Ascites    Cataract    bilateral - surgery to remove   Cirrhosis ([place])    Diabetes mellitus without complication ([place])    type 2 - last a1c was 5.2 per patient, no meds   Dyspnea    when I have too much fluid   Esophageal varices ([facility])    GERD (gastroesophageal reflux disease)    Heart murmur    Hepatic cirrhosis ([facility])    HLD (hyperlipidemia)    no meds   Hypertension    no meds   Iron deficiency anemia 09/26/2018   Past Surgical History:  Past Surgical History:  Procedure Laterality Date   BIOPSY  08/20/2019   Procedure: BIOPSY;  Surgeon: Jackquline Denmark, MD;  Location: [place];  Service: Gastroenterology;;   CARPAL TUNNEL RELEASE Right    CESAREAN SECTION     CHOLECYSTECTOMY     COLONOSCOPY  08/2019   ESOPHAGEAL BANDING N/A 09/19/2019   Procedure: ESOPHAGEAL BANDING;  Surgeon: Yetta Flock, MD;  Location: [facility];  Service: Gastroenterology;  Laterality: N/A;   ESOPHAGOGASTRODUODENOSCOPY (EGD) WITH PROPOFOL N/A 08/20/2019   Procedure: ESOPHAGOGASTRODUODENOSCOPY (EGD) WITH PROPOFOL;  Surgeon: Jackquline Denmark, MD;  Location: [place];  Service: Gastroenterology;  Laterality: N/A;   ESOPHAGOGASTRODUODENOSCOPY  (EGD) WITH PROPOFOL N/A 09/19/2019   Procedure: ESOPHAGOGASTRODUODENOSCOPY (EGD) WITH PROPOFOL;  Surgeon: Yetta Flock, MD;  Location: [facility];  Service: Gastroenterology;  Laterality: N/A;   ESOPHAGOGASTRODUODENOSCOPY (EGD) WITH PROPOFOL N/A 01/16/2020   Procedure: ESOPHAGOGASTRODUODENOSCOPY (EGD) WITH PROPOFOL;  Surgeon: Jerene Bears, MD;  Location: [place] [facility];  Service: Endoscopy;  Laterality: N/A;   EYE SURGERY Bilateral    removed cataracts   GASTRIC VARICES BANDING N/A 08/20/2019   Procedure: GASTRIC VARICES BANDING;  Surgeon: Jackquline Denmark, MD;  Location: [place];  Service: Gastroenterology;  Laterality: N/A;   HOT HEMOSTASIS N/A 01/16/2020   Procedure: HOT HEMOSTASIS (ARGON PLASMA COAGULATION/BICAP);  Surgeon: Jerene Bears, MD;  Location[facility];  Service: Endoscopy;  Laterality: N/A;   IR PARACENTESIS  08/01/2019   IR PARACENTESIS  08/19/2019   IR PARACENTESIS  09/09/2019   IR PARACENTESIS  09/23/2019   IR PARACENTESIS  10/08/2019   IR PARACENTESIS  10/23/2019   IR PARACENTESIS  11/12/2019   IR PARACENTESIS  11/21/2019   IR PARACENTESIS  11/25/2019   IR PARACENTESIS  12/05/2019   IR PARACENTESIS  12/13/2019   IR PARACENTESIS  12/20/2019   IR PARACENTESIS  12/26/2019   IR PARACENTESIS  12/31/2019   IR PARACENTESIS  01/07/2020   IR PARACENTESIS  01/10/2020   IR PARACENTESIS  01/13/2020   IR PARACENTESIS  01/14/2020   IR PARACENTESIS  01/20/2020   IR PARACENTESIS  01/30/2020   IR PARACENTESIS  02/24/2020  IR RADIOLOGIST EVAL & MGMT  10/30/2019   IR RADIOLOGIST EVAL & MGMT  12/11/2019   IR RADIOLOGIST EVAL & MGMT  12/24/2019   IR RADIOLOGIST EVAL & MGMT  03/05/2020   IR RADIOLOGIST EVAL & MGMT  05/15/2020   IR TIPS  11/25/2019   IR TIPS  01/14/2020   KNEE ARTHROSCOPY Right 12/10/2015   Procedure: RIGHT KNEE ARTHROSCOPY WITH PARTIAL MEDIAL MENISCECTOMY;  Surgeon: Mcarthur Rossetti, MD;  Location: [facility];  Service: Orthopedics;  Laterality: Right;   KNEE ARTHROSCOPY W/  MENISCAL REPAIR Bilateral    MENISCUS REPAIR Left    RADIOLOGY WITH ANESTHESIA N/A 11/25/2019   Procedure: TIPS;  Surgeon: Corrie Mckusick, DO;  Location: [place];  Service: Anesthesiology;  Laterality: N/A;   RADIOLOGY WITH ANESTHESIA N/A 01/14/2020   Procedure: TIPS;  Surgeon: Corrie Mckusick, DO;  Location: [place];  Service: Anesthesiology;  Laterality: N/A;    Assessment / Plan / Recommendation  Meagan Peck is a 61 year old right-handed female with history significant of Meagan Peck cirrhosis with previous hepatic encephalopathy maintained on chronic Cipro, diabetes mellitus hypertension hyperlipidemia, esophageal varices/GERD.  Per chart review patient lives with spouse and family. Presented 01/08/2021 altered mental status and slurred speech.  Denied any chills or fever.  Cranial CT scan negative for acute changes.  MRI showed striking abnormal diffusion, T2 and flair signal abnormality symmetrically affecting the pons with sparing of the bilateral cortical infarcts favoring osmotic demyelination syndrome.  Relatively subtle signal changes also in the bilateral thalamus felt to be nonspecific.  MRI cervical spine degenerative changes moderate to severe spinal stenosis C4-5 and moderate C5-6 and C6-7.  Therapy evaluations completed due to patient decreased functional mobility, dysarthria, and was admitted for a comprehensive rehab program. Patient transferred to CIR on 01/12/2021 .    Clinical Impression Patient presents with a moderate flaccid dysarthria resulting in imprecise consonant production and decreased speech intelligiblity at phrase, sentence and conversational levels. Speech intelligibility decreases over time during oral multi-paragraph reading. Patient did return demonstrate some speech strategies that SLP modeled (pausing between words, emphasis on initial consonants of words, slowing down). She did exhibit decreased respiratory support and required cues to take more frequent breaths to support  during phonation. Cognition and language ablities all appeared WFL-WNL as per this evaluation and patient was fully oriented, exhibited good awareness, memory, reasoning and problem solving. She also exhibits a questionable oral dysphagia with SLP planning more comprehensive evaluation next date as patient did not consume any solids during BSE (fatigued from full day of therapies). She denied any difficulty with mastication or fatigue during oral PO intake. SLP is recommending to continue with regular texture solids, thin liquids diet at this time. Patient will benefit from skilled SLP intervention to maximize speech production and swallow safety prior to discharge.  Skilled Therapeutic Interventions          BSE, SLE  SLP Assessment  Patient will need skilled Speech Lanaguage Pathology Services during CIR admission    Recommendations  SLP Diet Recommendations: Age appropriate regular solids;Thin Liquid Administration via: Cup;Straw Medication Administration: Whole meds with liquid Supervision: Patient able to self feed;Intermittent supervision to cue for compensatory strategies Compensations: Minimize environmental distractions;Slow rate;Small sips/bites Postural Changes and/or Swallow Maneuvers: Seated upright 90 degrees Oral Care Recommendations: Oral care BID Recommendations for Other Services: Neuropsych consult Patient destination: Home Follow up Recommendations: Other (comment) (TBD) Equipment Recommended: None recommended by SLP    SLP Frequency 3 to 5 out of 7 days  SLP Duration  SLP Intensity  SLP Treatment/Interventions 18-20 days (may be shorter for ST)  Minumum of 1-2 x/day, 30 to 90 minutes  Dysphagia/aspiration precaution training;Speech/Language facilitation;Patient/family education    Pain Pain Assessment Pain Scale: 0-10 Pain Score: 0-No pain  Prior Functioning Cognitive/Linguistic Baseline: Within functional limits Type of Home: House  Lives With:  Spouse;Daughter Available Help at Discharge: Family;Available 24 hours/day Education: Associates degree Vocation: Other (Comment) (reported she "lost my job" but 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)  SLP Evaluation Cognition Overall Cognitive Status: Within Functional Limits for tasks assessed Arousal/Alertness: Awake/alert  Comprehension Auditory Comprehension Overall Auditory Comprehension: Appears within functional limits for tasks assessed Yes/No Questions: Within Functional Limits Commands: Within Functional Limits Expression Expression Primary Mode of Expression: Verbal Verbal Expression Overall Verbal Expression: Appears within functional limits for tasks assessed Initiation: No impairment Oral Motor Oral Motor/Sensory Function Overall Oral Motor/Sensory Function: Mild impairment Facial ROM: Within Functional Limits Facial Symmetry: Within Functional Limits Facial Strength: Within Functional Limits Facial Sensation: Within Functional Limits Lingual ROM: Within Functional Limits Lingual Symmetry: Within Functional Limits Lingual Strength: Reduced Lingual Sensation: Within Functional Limits Velum: Within Functional Limits Mandible: Within Functional Limits Motor Speech Overall Motor Speech: Impaired Respiration: Impaired Level of Impairment: Phrase Phonation: Normal Resonance: Within functional limits Articulation: Impaired Level of Impairment: Phrase Intelligibility: Intelligibility reduced Word: 75-100% accurate Phrase: 50-74% accurate Sentence: 50-74% accurate Conversation: 50-74% accurate Motor Planning: Witnin functional limits Motor Speech Errors: Not applicable Effective Techniques: Slow rate;Increased vocal intensity;Over-articulate;Pacing  Care Tool Care Tool Cognition Ability to hear (with hearing aid or hearing appliances if normally used Ability to hear (with hearing aid or hearing appliances if normally used): 0. Adequate - no difficulty in  normal conservation, social interaction, listening to TV   Expression of Ideas and Wants Expression of Ideas and Wants: 3. Some difficulty - exhibits some difficulty with expressing needs and ideas (e.g, some words or finishing thoughts) or speech is not clear   Understanding Verbal and Non-Verbal Content Understanding Verbal and Non-Verbal Content: 4. Understands (complex and basic) - clear comprehension without cues or repetitions  Memory/Recall Ability Memory/Recall Ability : Current season;That he or she is in a hospital/hospital unit;Location of own room   Intelligibility: Intelligibility reduced Word: 75-100% accurate Phrase: 50-74% accurate Sentence: 50-74% accurate Conversation: 50-74% accurate  Bedside Swallowing Assessment General Date of Onset: 01/08/21 Previous Swallow Assessment: BSE 9/10 Diet Prior to this Study: Regular;Thin liquids Temperature Spikes Noted: No Respiratory Status: Room air History of Recent Intubation: No Behavior/Cognition: Alert;Cooperative;Pleasant mood Oral Cavity - Dentition: Adequate natural dentition Self-Feeding Abilities: Able to feed self with adaptive devices Vision: Functional for self-feeding Patient Positioning: Upright in bed Baseline Vocal Quality: Normal Volitional Cough: Strong Volitional Swallow: Able to elicit  Oral Care Assessment   Ice Chips   Thin Liquid Thin Liquid: Within functional limits Presentation: Straw Nectar Thick   Honey Thick   Puree Puree: Not tested Solid Solid: Not tested Other Comments: patient fatigued from full day of therapy BSE Assessment Risk for Aspiration Impact on safety and function: Mild aspiration risk Other Related Risk Factors: Deconditioning  Short Term Goals: Week 1: SLP Short Term Goal 1 (Week 1): Patient will demonstrate adequate toleration of regular solids as evidenced by efficient mastication, oral control and swallow initiation with supervision to minA verbal and  demonstration cues. SLP Short Term Goal 2 (Week 1): Patient will return-demonstrate to perform speech strategies (increase volume, emphasize/exaggerate articulatory placement, manner and transitions between phonemes) at phrase  and sentence level with minA verbal and modeling cues. SLP Short Term Goal 3 (Week 1): Patient will demonstrate adequate coordination of respiratory support with phonation at phrase and sentence level with minA verbal and modeling cues. SLP Short Term Goal 4 (Week 1): Patient will demonstrate 90% intelligibility of speech at 3-4 sentence oral reading level with minA verbal cues for use of strateiges.  Refer to Care Plan for Long Term Goals  Recommendations for other services: Neuropsych  Discharge Criteria: Patient will be discharged from SLP if patient refuses treatment 3 consecutive times without medical reason, if treatment goals not met, if there is a change in medical status, if patient makes no progress towards goals or if patient is discharged from hospital.  The above assessment, treatment plan, treatment alternatives and goals were discussed and mutually agreed upon: by patient  Sonia Baller, MA, CCC-SLP Speech Therapy

## 2021-01-13 NOTE — Plan of Care (Signed)
  Problem: RH Balance Goal: LTG Patient will maintain dynamic standing with ADLs (OT) Description: LTG:  Patient will maintain dynamic standing balance with assist during activities of daily living (OT)  Flowsheets (Taken 01/13/2021 1655) LTG: Pt will maintain dynamic standing balance during ADLs with: Supervision/Verbal cueing   Problem: Sit to Stand Goal: LTG:  Patient will perform sit to stand in prep for activites of daily living with assistance level (OT) Description: LTG:  Patient will perform sit to stand in prep for activites of daily living with assistance level (OT) Flowsheets (Taken 01/13/2021 1655) LTG: PT will perform sit to stand in prep for activites of daily living with assistance level: Supervision/Verbal cueing   Problem: RH Eating Goal: LTG Patient will perform eating w/assist, cues/equip (OT) Description: LTG: Patient will perform eating with assist, with/without cues using equipment (OT) Flowsheets (Taken 01/13/2021 1655) LTG: Pt will perform eating with assistance level of: Set up assist    Problem: RH Grooming Goal: LTG Patient will perform grooming w/assist,cues/equip (OT) Description: LTG: Patient will perform grooming with assist, with/without cues using equipment (OT) Flowsheets (Taken 01/13/2021 1655) LTG: Pt will perform grooming with assistance level of: Set up assist    Problem: RH Bathing Goal: LTG Patient will bathe all body parts with assist levels (OT) Description: LTG: Patient will bathe all body parts with assist levels (OT) Flowsheets (Taken 01/13/2021 1655) LTG: Pt will perform bathing with assistance level/cueing: Minimal Assistance - Patient > 75%   Problem: RH Dressing Goal: LTG Patient will perform upper body dressing (OT) Description: LTG Patient will perform upper body dressing with assist, with/without cues (OT). Flowsheets (Taken 01/13/2021 1655) LTG: Pt will perform upper body dressing with assistance level of: Supervision/Verbal  cueing Goal: LTG Patient will perform lower body dressing w/assist (OT) Description: LTG: Patient will perform lower body dressing with assist, with/without cues in positioning using equipment (OT) Flowsheets (Taken 01/13/2021 1655) LTG: Pt will perform lower body dressing with assistance level of: Minimal Assistance - Patient > 75%   Problem: RH Toileting Goal: LTG Patient will perform toileting task (3/3 steps) with assistance level (OT) Description: LTG: Patient will perform toileting task (3/3 steps) with assistance level (OT)  Flowsheets (Taken 01/13/2021 1655) LTG: Pt will perform toileting task (3/3 steps) with assistance level: Supervision/Verbal cueing   Problem: RH Functional Use of Upper Extremity Goal: LTG Patient will use RT/LT upper extremity as a (OT) Description: LTG: Patient will use right/left upper extremity as a stabilizer/gross assist/diminished/nondominant/dominant level with assist, with/without cues during functional activity (OT) Flowsheets (Taken 01/13/2021 1655) LTG: Use of upper extremity in functional activities: RUE as dominant level LTG: Pt will use upper extremity in functional activity with assistance level of: Independent   Problem: RH Toilet Transfers Goal: LTG Patient will perform toilet transfers w/assist (OT) Description: LTG: Patient will perform toilet transfers with assist, with/without cues using equipment (OT) Flowsheets (Taken 01/13/2021 1655) LTG: Pt will perform toilet transfers with assistance level of: Supervision/Verbal cueing   Problem: RH Tub/Shower Transfers Goal: LTG Patient will perform tub/shower transfers w/assist (OT) Description: LTG: Patient will perform tub/shower transfers with assist, with/without cues using equipment (OT) Flowsheets (Taken 01/13/2021 1655) LTG: Pt will perform tub/shower stall transfers with assistance level of: Supervision/Verbal cueing

## 2021-01-13 NOTE — Progress Notes (Signed)
Inpatient Rehabilitation Care Coordinator Assessment and Plan Patient Details  Name: PROMISS LABARBERA MRN: [id number] Date of Birth: 08/26/1960  Today's Date: 01/13/2021  Hospital Problems: Principal Problem:   Demyelinating disease of central nervous system, unspecified ([place]) Active Problems:   Hepatic encephalopathy ([place])   Acute on chronic anemia   Uncontrolled type 2 diabetes mellitus with hyperglycemia ([place])  Past Medical History:  Past Medical History:  Diagnosis Date   Acute medial meniscus tear of right knee 12/10/2015   Allergy    Anasarca    Arthritis    bil feet   Ascites    Cataract    bilateral - surgery to remove   Cirrhosis ([place])    Diabetes mellitus without complication ([place])    type 2 - last a1c was 5.2 per patient, no meds   Dyspnea    when I have too much fluid   Esophageal varices ([facility])    GERD (gastroesophageal reflux disease)    Heart murmur    Hepatic cirrhosis ([facility])    HLD (hyperlipidemia)    no meds   Hypertension    no meds   Iron deficiency anemia 09/26/2018   Past Surgical History:  Past Surgical History:  Procedure Laterality Date   BIOPSY  08/20/2019   Procedure: BIOPSY;  Surgeon: Jackquline Denmark, MD;  Location: [facility];  Service: Gastroenterology;;   CARPAL TUNNEL RELEASE Right    CESAREAN SECTION     CHOLECYSTECTOMY     COLONOSCOPY  08/2019   ESOPHAGEAL BANDING N/A 09/19/2019   Procedure: ESOPHAGEAL BANDING;  Surgeon: Yetta Flock, MD;  Location: [facility];  Service: Gastroenterology;  Laterality: N/A;   ESOPHAGOGASTRODUODENOSCOPY (EGD) WITH PROPOFOL N/A 08/20/2019   Procedure: ESOPHAGOGASTRODUODENOSCOPY (EGD) WITH PROPOFOL;  Surgeon: Jackquline Denmark, MD;  Location: [facility];  Service: Gastroenterology;  Laterality: N/A;   ESOPHAGOGASTRODUODENOSCOPY (EGD) WITH PROPOFOL N/A 09/19/2019   Procedure: ESOPHAGOGASTRODUODENOSCOPY (EGD) WITH PROPOFOL;  Surgeon: Yetta Flock, MD;  Location: [facility];  Service:  Gastroenterology;  Laterality: N/A;   ESOPHAGOGASTRODUODENOSCOPY (EGD) WITH PROPOFOL N/A 01/16/2020   Procedure: ESOPHAGOGASTRODUODENOSCOPY (EGD) WITH PROPOFOL;  Surgeon: Jerene Bears, MD;  Location: [facility];  Service: Endoscopy;  Laterality: N/A;   EYE SURGERY Bilateral    removed cataracts   GASTRIC VARICES BANDING N/A 08/20/2019   Procedure: GASTRIC VARICES BANDING;  Surgeon: Jackquline Denmark, MD;  Location: [facility];  Service: Gastroenterology;  Laterality: N/A;   HOT HEMOSTASIS N/A 01/16/2020   Procedure: HOT HEMOSTASIS (ARGON PLASMA COAGULATION/BICAP);  Surgeon: Jerene Bears, MD;  Location: [facility];  Service: Endoscopy;  Laterality: N/A;   IR PARACENTESIS  08/01/2019   IR PARACENTESIS  08/19/2019   IR PARACENTESIS  09/09/2019   IR PARACENTESIS  09/23/2019   IR PARACENTESIS  10/08/2019   IR PARACENTESIS  10/23/2019   IR PARACENTESIS  11/12/2019   IR PARACENTESIS  11/21/2019   IR PARACENTESIS  11/25/2019   IR PARACENTESIS  12/05/2019   IR PARACENTESIS  12/13/2019   IR PARACENTESIS  12/20/2019   IR PARACENTESIS  12/26/2019   IR PARACENTESIS  12/31/2019   IR PARACENTESIS  01/07/2020   IR PARACENTESIS  01/10/2020   IR PARACENTESIS  01/13/2020   IR PARACENTESIS  01/14/2020   IR PARACENTESIS  01/20/2020   IR PARACENTESIS  01/30/2020   IR PARACENTESIS  02/24/2020   IR RADIOLOGIST EVAL & MGMT  10/30/2019   IR RADIOLOGIST EVAL & MGMT  12/11/2019   IR RADIOLOGIST EVAL & MGMT  12/24/2019   IR  RADIOLOGIST EVAL & MGMT  03/05/2020   IR RADIOLOGIST EVAL & MGMT  05/15/2020   IR TIPS  11/25/2019   IR TIPS  01/14/2020   KNEE ARTHROSCOPY Right 12/10/2015   Procedure: RIGHT KNEE ARTHROSCOPY WITH PARTIAL MEDIAL MENISCECTOMY;  Surgeon: Mcarthur Rossetti, MD;  Location: [facility];  Service: Orthopedics;  Laterality: Right;   KNEE ARTHROSCOPY W/ MENISCAL REPAIR Bilateral    MENISCUS REPAIR Left    RADIOLOGY WITH ANESTHESIA N/A 11/25/2019   Procedure: TIPS;  Surgeon: Corrie Mckusick, DO;  Location: [facility];  Service:  Anesthesiology;  Laterality: N/A;   RADIOLOGY WITH ANESTHESIA N/A 01/14/2020   Procedure: TIPS;  Surgeon: Corrie Mckusick, DO;  Location: [facility];  Service: Anesthesiology;  Laterality: N/A;   Social History:  reports that she has never smoked. She has never used smokeless tobacco. She reports that she does not drink alcohol and does not use drugs.  Family / Support Systems Marital Status: Married How Long?: 60 years Patient Roles: Spouse, Parent Spouse/Significant Other: Jeral Fruit (husband) [phone]; husband has TBI Children: Dtr Museum/gallery conservator [phone]. Lives in the home, and will be primary contact. Other Supports: None reported Anticipated Caregiver: Dtr Museum/gallery conservator for pt physical care needs Ability/Limitations of Caregiver: Pt husband only provide supervision due to TBI Caregiver Availability: 24/7 Family Dynamics: Pt lives at home with her husband and their Environmental education officer.  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 Education: associate degree in Health Information. Health Literacy - How often do you need to have someone help you when you read instructions, pamphlets, or other written material from your doctor or pharmacy?: Never Writes: Yes Employment Status: Unemployed Date Retired/Disabled/Unemployed: March 2022 Legal History/Current Legal Issues: Denies Guardian/Conservator: N/A   Abuse/Neglect Abuse/Neglect Assessment Can Be Completed: Yes Physical Abuse: Denies Verbal Abuse: Denies Sexual Abuse: Denies Exploitation of patient/patient's resources: Denies Self-Neglect: Denies  Patient response to: Social Isolation - How often do you feel lonely or isolated from those around you?: Never  Emotional Status Pt's affect, behavior and adjustment status: Pt pleasant and appears to be aware of situation. Pt was very emotional during assessment, and would intermittently  just begin crying. Recent Psychosocial Issues: Denies Psychiatric History: Denies Substance Abuse History: Denies  Patient / Family Perceptions, Expectations & Goals Pt/Family understanding of illness & functional limitations: Pt and family have a general understanding of patient care needs Premorbid pt/family roles/activities: Independent primarily with SPC; some assistance with bathing PTA Anticipated changes in roles/activities/participation: Assistance with ADLs/IADLs Pt/family expectations/goals: Pt goal is to "be able to walk and feed self."  [employer]: None Premorbid Home Care/DME Agencies: None Transportation available at discharge: Environmental education officer Is the patient able to respond to transportation needs?: Yes In the past 12 months, has lack of transportation kept you from medical appointments or from getting medications?: No In the past 12 months, has lack of transportation kept you from meetings, work, or from getting things needed for daily living?: No Resource referrals recommended: Neuropsychology  Discharge Planning Living Arrangements: Spouse/significant other, Children Support Systems: Spouse/significant other, Children Type of Residence: Private residence Insurance Resources: Teacher, adult education Resources: Family Support Financial Screen Referred: Yes Living Expenses: Mortgage Money Management: Family Does the patient have any problems obtaining your medications?: No Home Management: Pt helped managed homecare needs along with her dtr Radiographer, therapeutic Preliminary Plans: Pt dtr to continue to manage homecare needs Care Coordinator Barriers to Discharge:  Decreased caregiver support, Lack of/limited family support, Other (comments) Care Coordinator Barriers to Discharge Comments: Pt is uninsured Care Coordinator Anticipated Follow Up Needs: HH/OP Expected length of stay: 18-21 days  Clinical Impression SW met with pt in room to complete  assessment while she was eating lunch with CNA in room. Pt is not a English as a second language teacher. No HCPOA. DME: cane, unsure about RW. Pt is aware SW to follow-up with her dtr [employer].   1502- SW left message for pt dtr Amber to introduce self, explain role, and discuss discharge process.SW waiting on follow-up. *SW returned phone call to pt Environmental education officer. Confirms that she will be patient primary contact. States that she has a flexible work schedule and will be able to help assist her parents. SW informed ELOS being 18-20 days, and there will be follow-up after team conference. SW briefly addressed pt being uninsured and resources available. SW informed pt is waiting to be screened for Medicaid, and SW will discuss in more detail potential DME items to begin looking for after team conference on Tuesday, and family education. SW encouraged follow-up if needed.   SW emailed Heath who reported will continue to try and make attempts to see patient as pt was with medical team at time of visit.   Clive Parcel A Duana Benedict 01/13/2021, 3:30 PM

## 2021-01-13 NOTE — Evaluation (Signed)
Occupational Therapy Assessment and Plan  Patient Details  Name: Meagan Peck MRN: [id number] Date of Birth: 1960/12/16  OT Diagnosis: abnormal posture, altered mental status, hemiplegia affecting dominant side, and muscle weakness (generalized) Rehab Potential:   ELOS: 18-20 days   Today's Date: 01/13/2021 OT Individual Time: 9935-7017 OT Individual Time Calculation (min): 18 min     Hospital Problem: Principal Problem:   Demyelinating disease of central nervous system, unspecified ([place]) Active Problems:   Hepatic encephalopathy ([place])   Acute on chronic anemia   Uncontrolled type 2 diabetes mellitus with hyperglycemia ([place])   Past Medical History:  Past Medical History:  Diagnosis Date   Acute medial meniscus tear of right knee 12/10/2015   Allergy    Anasarca    Arthritis    bil feet   Ascites    Cataract    bilateral - surgery to remove   Cirrhosis ([place])    Diabetes mellitus without complication ([place])    type 2 - last a1c was 5.2 per patient, no meds   Dyspnea    when I have too much fluid   Esophageal varices ([facility])    GERD (gastroesophageal reflux disease)    Heart murmur    Hepatic cirrhosis ([facility])    HLD (hyperlipidemia)    no meds   Hypertension    no meds   Iron deficiency anemia 09/26/2018   Past Surgical History:  Past Surgical History:  Procedure Laterality Date   BIOPSY  08/20/2019   Procedure: BIOPSY;  Surgeon: Jackquline Denmark, MD;  Location: [facility];  Service: Gastroenterology;;   CARPAL TUNNEL RELEASE Right    CESAREAN SECTION     CHOLECYSTECTOMY     COLONOSCOPY  08/2019   ESOPHAGEAL BANDING N/A 09/19/2019   Procedure: ESOPHAGEAL BANDING;  Surgeon: Yetta Flock, MD;  [facility];  Service: Gastroenterology;  Laterality: N/A;   ESOPHAGOGASTRODUODENOSCOPY (EGD) WITH PROPOFOL N/A 08/20/2019   Procedure: ESOPHAGOGASTRODUODENOSCOPY (EGD) WITH PROPOFOL;  Surgeon: Jackquline Denmark, MD;  Location: [facility];  Service:  Gastroenterology;  Laterality: N/A;   ESOPHAGOGASTRODUODENOSCOPY (EGD) WITH PROPOFOL N/A 09/19/2019   Procedure: ESOPHAGOGASTRODUODENOSCOPY (EGD) WITH PROPOFOL;  Surgeon: Yetta Flock, MD;  [facility];  Service: Gastroenterology;  Laterality: N/A;   ESOPHAGOGASTRODUODENOSCOPY (EGD) WITH PROPOFOL N/A 01/16/2020   Procedure: ESOPHAGOGASTRODUODENOSCOPY (EGD) WITH PROPOFOL;  Surgeon: Jerene Bears, MD;  Location: [facility];  Service: Endoscopy;  Laterality: N/A;   EYE SURGERY Bilateral    removed cataracts   GASTRIC VARICES BANDING N/A 08/20/2019   Procedure: GASTRIC VARICES BANDING;  Surgeon: Jackquline Denmark, MD;  Location: [facility];  Service: Gastroenterology;  Laterality: N/A;   HOT HEMOSTASIS N/A 01/16/2020   Procedure: HOT HEMOSTASIS (ARGON PLASMA COAGULATION/BICAP);  Surgeon: Jerene Bears, MD;  Location: [facility];  Service: Endoscopy;  Laterality: N/A;   IR PARACENTESIS  08/01/2019   IR PARACENTESIS  08/19/2019   IR PARACENTESIS  09/09/2019   IR PARACENTESIS  09/23/2019   IR PARACENTESIS  10/08/2019   IR PARACENTESIS  10/23/2019   IR PARACENTESIS  11/12/2019   IR PARACENTESIS  11/21/2019   IR PARACENTESIS  11/25/2019   IR PARACENTESIS  12/05/2019   IR PARACENTESIS  12/13/2019   IR PARACENTESIS  12/20/2019   IR PARACENTESIS  12/26/2019   IR PARACENTESIS  12/31/2019   IR PARACENTESIS  01/07/2020   IR PARACENTESIS  01/10/2020   IR PARACENTESIS  01/13/2020   IR PARACENTESIS  01/14/2020   IR PARACENTESIS  01/20/2020   IR PARACENTESIS  01/30/2020   IR PARACENTESIS  02/24/2020   IR RADIOLOGIST EVAL & MGMT  10/30/2019   IR RADIOLOGIST EVAL & MGMT  12/11/2019   IR RADIOLOGIST EVAL & MGMT  12/24/2019   IR RADIOLOGIST EVAL & MGMT  03/05/2020   IR RADIOLOGIST EVAL & MGMT  05/15/2020   IR TIPS  11/25/2019   IR TIPS  01/14/2020   KNEE ARTHROSCOPY Right 12/10/2015   Procedure: RIGHT KNEE ARTHROSCOPY WITH PARTIAL MEDIAL MENISCECTOMY;  Surgeon: Mcarthur Rossetti, MD;  Location: [facility];   Service: Orthopedics;  Laterality: Right;   KNEE ARTHROSCOPY W/ MENISCAL REPAIR Bilateral    MENISCUS REPAIR Left    RADIOLOGY WITH ANESTHESIA N/A 11/25/2019   Procedure: TIPS;  Surgeon: Corrie Mckusick, DO;  Location: [facility];  Service: Anesthesiology;  Laterality: N/A;   RADIOLOGY WITH ANESTHESIA N/A 01/14/2020   Procedure: TIPS;  Surgeon: Corrie Mckusick, DO;  Location: [facility];  Service: Anesthesiology;  Laterality: N/A;    Assessment & Plan Clinical Impression: Meagan Peck is a 60 year old right-handed female with history significant of Meagan Peck cirrhosis with previous hepatic encephalopathy maintained on chronic Cipro, diabetes mellitus hypertension hyperlipidemia, esophageal varices/GERD.  Per chart review patient lives with spouse and family.  Two-level home able to live on the main level.  Needed some assist with ADLs she mostly sponge bathes.  Presented 01/08/2021 altered mental status and slurred speech.  Denied any chills or fever.  Admission chemistries unremarkable except glucose 348, AST 54, ammonia level 83, hemoglobin A1c 10.7, hemoglobin 9.2, urinalysis negative nitrite.  Cranial CT scan negative for acute changes.  MRI showed striking abnormal diffusion, T2 and flair signal abnormality symmetrically affecting the pons with sparing of the bilateral cortical infarcts favoring osmotic demyelination syndrome.  Relatively subtle signal changes also in the bilateral thalamus felt to be nonspecific.  Ultrasound the abdomen showed trace upper abdominal ascites and no plan for paracentesis.  MRI cervical spine degenerative changes moderate to severe spinal stenosis C4-5 and moderate C5-6 and C6-7.  There was some mass-effect on the cord at these levels worse at C4-5 without cord signal abnormality.  Latest ammonia level 139 continues on scheduled Chronulac.  Therapy evaluations completed due to patient decreased functional mobility was admitted for a comprehensive rehab program. Patient transferred to CIR  on 01/12/2021 .    Patient currently requires max with basic self-care skills secondary to muscle weakness, decreased cardiorespiratoy endurance, impaired timing and sequencing, decreased coordination, and decreased motor planning, decreased motor planning, and decreased sitting balance, decreased standing balance, decreased postural control, hemiplegia, and decreased balance strategies.  Prior to hospitalization, patient could complete BADL with min.  Patient will benefit from skilled intervention to decrease level of assist with basic self-care skills and increase independence with basic self-care skills prior to discharge home with care partner.  Anticipate patient will require 24 hour supervision and follow up home health.  OT - End of Session Activity Tolerance: Tolerates 10 - 20 min activity with multiple rests Endurance Deficit: Yes Endurance Deficit Description: Pt became fatigued quickly and requires frequent rest breaks OT Assessment OT Patient demonstrates impairments in the following area(s): Balance;Endurance;Motor;Pain;Safety;Perception OT Basic ADL's Functional Problem(s): Eating;Grooming;Bathing;Dressing;Toileting OT Transfers Functional Problem(s): Toilet;Tub/Shower OT Additional Impairment(s): Fuctional Use of Upper Extremity OT Plan OT Intensity: Minimum of 1-2 x/day, 45 to 90 minutes OT Frequency: 5 out of 7 days OT Duration/Estimated Length of Stay: 18-20 days OT Treatment/Interventions: Balance/vestibular training;Discharge planning;Pain management;Self Care/advanced ADL retraining;Therapeutic Activities;UE/LE Coordination activities;Visual/perceptual remediation/compensation;Therapeutic Exercise;Skin care/wound managment;Patient/family education;Functional  mobility training;Disease mangement/prevention;Cognitive remediation/compensation;Community reintegration;DME/adaptive equipment instruction;Neuromuscular re-education;Psychosocial support;Splinting/orthotics;UE/LE Strength  taining/ROM;Wheelchair propulsion/positioning OT Self Feeding Anticipated Outcome(s): Set up OT Basic Self-Care Anticipated Outcome(s): Supervision OT Toileting Anticipated Outcome(s): Supervision OT Bathroom Transfers Anticipated Outcome(s): Supervision OT Recommendation Patient destination: Home Follow Up Recommendations: Home health OT Equipment Recommended: To be determined   OT Evaluation Precautions/Restrictions  Precautions Precautions: Fall Precaution Comments: R sided weakness, fall risk Restrictions Weight Bearing Restrictions: No Pain Pain Assessment Pain Scale: 0-10 Pain Score: 5  Pain Location: Head Home Living/Prior Functioning Home Living Family/patient expects to be discharged to:: Private residence Living Arrangements: Spouse/significant other, Children Available Help at Discharge: Family, Available 24 hours/day Type of Home: House Home Access: Stairs to enter [employer] of Steps: 1+1+1 then 2 Entrance Stairs-Rails: None Home Layout: Able to live on main level with bedroom/bathroom Bathroom Shower/Tub: Chiropodist: Standard (per chart) Bathroom Accessibility: Yes  Lives With: Spouse, Daughter Prior Function Level of Independence: Requires assistive device for independence, Needs assistance with ADLs, Independent with transfers, Needs assistance with homemaking  Able to Take Stairs?: No Driving: No Vocation: Other (Comment) Comments: Spouse and daughter complete majority of meal prep and home management. daughter was assisting with bathing/ADLs since onset Vision Baseline Vision/History: 1 Wears glasses (blind in R eye per pt) Ability to See in Adequate Light: 0 Adequate Patient Visual Report: No change from baseline Additional Comments: Spouse and daughter complete majority of meal prep and home management. Perception  Perception: Within Functional Limits Praxis Praxis: Intact Cognition Overall Cognitive Status:  Within Functional Limits for tasks assessed Arousal/Alertness: Awake/alert Orientation Level: Person;Place;Situation Person: Oriented Place: Oriented Situation: Oriented Year: 2022 Month: September Day of Week: Correct Memory: Appears intact Immediate Memory Recall: Sock;Blue;Bed Memory Recall Sock: Without Cue Memory Recall Blue: Without Cue Memory Recall Bed: Without Cue Attention: Focused;Sustained Focused Attention: Appears intact Sustained Attention: Appears intact Awareness: Appears intact Problem Solving: Appears intact Safety/Judgment: Appears intact Comments: pt very emotional throughout session Sensation Sensation Light Touch: Appears Intact Hot/Cold: Appears Intact Proprioception: Impaired by gross assessment Coordination Gross Motor Movements are Fluid and Coordinated: No Fine Motor Movements are Fluid and Coordinated: No Coordination and Movement Description: grossly uncoordinated d/t weakness and decreased sitting/standing balance Finger Nose Finger Test: difficulty sitting unsupported to complete Motor  Motor Motor: Hemiplegia Motor - Skilled Clinical Observations: Pt presents with R>L sided weakness  Trunk/Postural Assessment  Cervical Assessment Cervical Assessment: Exceptions to [facility] Thoracic Assessment Thoracic Assessment: Exceptions to [place] Lumbar Assessment Lumbar Assessment: Exceptions to [facility] Postural Control Postural Control: Deficits on evaluation Trunk Control: poor trunk control, LOB in multiple directions  Balance Balance Balance Assessed: Yes Static Sitting Balance Static Sitting - Balance Support: Bilateral upper extremity supported;Feet supported Static Sitting - Level of Assistance: 3: Mod assist Static Standing Balance Static Standing - Balance Support: Bilateral upper extremity supported Static Standing - Level of Assistance: 4: Min assist Dynamic Standing Balance Dynamic Standing - Balance Support: Right upper extremity  supported Dynamic Standing - Level of Assistance: 4: Min assist Dynamic Standing - Comments: standing during LB dressing Extremity/Trunk Assessment RUE Assessment RUE Assessment: Exceptions to [facility] Active Range of Motion (AROM) Comments: shoulder roughly 75* with extended time, elbow WFL, weak wrist extensors General Strength Comments: grossly 3+ to 4-/5 LUE Assessment LUE Assessment: Exceptions to [facility] General Strength Comments: roughly 4-/5, generalized weakness limiting  Care Tool Care Tool Self Care Eating    Max A     Oral Care     Max A  Bathing   Body parts bathed by patient: Right arm;Left arm;Chest;Abdomen;Right upper leg;Left upper leg;Face (simulated) Body parts bathed by helper: Front perineal area;Buttocks;Right lower leg;Left lower leg   Assist Level: Maximal Assistance - Patient 24 - 49%    Upper Body Dressing(including orthotics)   What is the patient wearing?: Pull over shirt   Assist Level: Moderate Assistance - Patient 50 - 74%    Lower Body Dressing (excluding footwear)   What is the patient wearing?: Underwear/pull up;Pants Assist for lower body dressing: Total Assistance - Patient < 25%    Putting on/Taking off footwear   What is the patient wearing?: Non-skid slipper socks Assist for footwear: Dependent - Patient 0%       Care Tool Toileting Toileting activity    Total A     Care Tool Bed Mobility Roll left and right activity   Roll left and right assist level: Moderate Assistance - Patient 50 - 74%    Sit to lying activity   Sit to lying assist level: Contact Guard/Touching assist    Lying to sitting on side of bed activity   Lying to sitting on side of bed assist level: the ability to move from lying on the back to sitting on the side of the bed with no back support.: Maximal Assistance - Patient 25 - 49%     Care Tool Transfers Sit to stand transfer   Sit to stand assist level: Minimal Assistance - Patient > 75%    Chair/bed  transfer   Chair/bed transfer assist level: Moderate Assistance - Patient 50 - 74% (with HHA)     Toilet transfer    Mod A     Care Tool Cognition  Expression of Ideas and Wants Expression of Ideas and Wants: 3. Some difficulty - exhibits some difficulty with expressing needs and ideas (e.g, some words or finishing thoughts) or speech is not clear  Understanding Verbal and Non-Verbal Content Understanding Verbal and Non-Verbal Content: 3. Usually understands - understands most conversations, but misses some part/intent of message. Requires cues at times to understand   Memory/Recall Ability Memory/Recall Ability : Current season;Location of own room;That he or she is in a hospital/hospital unit   Refer to Care Plan for [place] 1 OT Short Term Goal 1 (Week 1): Pt will complete BSC/toilet transfer Min A of 1 consistently OT Short Term Goal 2 (Week 1): Pt will perform LB dress at sit <> stand Mod A with AE PRN OT Short Term Goal 3 (Week 1): Pt will perform self feeding with AE PRN with Min A  Recommendations for other services: Neuropsych   Skilled Therapeutic Intervention ADL ADL Eating: Not assessed Grooming: Not assessed Upper Body Bathing: Minimal assistance Lower Body Bathing: Maximal assistance Upper Body Dressing: Moderate assistance Lower Body Dressing: Dependent Toileting: Not assessed Toilet Transfer: Not assessed Mobility  Bed Mobility Bed Mobility: Rolling Right;Supine to Sit;Sit to Supine Rolling Right: Moderate Assistance - Patient 50-74% Right Sidelying to Sit: Maximal Assistance - Patient 25-49% Supine to Sit: Contact Guard/Touching assist Sit to Supine: Maximal Assistance - Patient 25-49% Transfers Sit to Stand: Minimal Assistance - Patient > 75% Stand to Sit: Minimal Assistance - Patient > 75%  Skilled Intervention: Pt greeted at time of session semireclined in bed, bursting into crying upon therapist entering room. Pt stating  8/10 HA pain, relayed to RN who presented quickly and provided pain medication. Bed mobility supine > sit Mod/Max A and pt sitting  EOB initially with Max A for sitting balance fading to CGA/Min throughout session with pt losing balance in multiple directions. UB/LB dressing EOB with Mod A for UB and total A for LB to thread (pt attempted but unable to fully thread) and sit > stand Min A for therapist to don over hips. Note pt with shorter stature and needed stool under feet when sitting EOB for balance. Pt needed several rest breaks and extended time for tasks. Returned to supine and scooted up in bed, alarm on call bell in reach.     Discharge Criteria: Patient will be discharged from OT if patient refuses treatment 3 consecutive times without medical reason, if treatment goals not met, if there is a change in medical status, if patient makes no progress towards goals or if patient is discharged from hospital.  The above assessment, treatment plan, treatment alternatives and goals were discussed and mutually agreed upon: by patient  Viona Gilmore 01/13/2021, 12:56 PM

## 2021-01-13 NOTE — Progress Notes (Signed)
Inpatient Rehabilitation  Patient information reviewed and entered into eRehab system by Romana Deaton M. Bridgid Printz, M.A., CCC/SLP, PPS Coordinator.  Information including medical coding, functional ability and quality indicators will be reviewed and updated through discharge.    

## 2021-01-14 DIAGNOSIS — M4802 Spinal stenosis, cervical region: Secondary | ICD-10-CM

## 2021-01-14 LAB — GLUCOSE, CAPILLARY
Glucose-Capillary: 135 mg/dL — ABNORMAL HIGH (ref 70–99)
Glucose-Capillary: 245 mg/dL — ABNORMAL HIGH (ref 70–99)
Glucose-Capillary: 253 mg/dL — ABNORMAL HIGH (ref 70–99)
Glucose-Capillary: 220 mg/dL — ABNORMAL HIGH (ref 70–99)

## 2021-01-14 NOTE — Progress Notes (Addendum)
[place] PHYSICAL MEDICINE & REHABILITATION PROGRESS NOTE  Subjective/Complaints: Patient seen sitting up in bed this AM.  She states she slept well overnight.  She notes tingling in her right hand since last night.  She states she had a tiring first day of therapies yesterday.  ROS: Denies CP, SOB, N/V/D  Objective: Vital Signs: Blood pressure (!) 135/58, pulse 72, temperature 98.6 F (37 C), temperature source Oral, resp. rate 20, height 4' 10"  (1.473 m), weight 62.2 kg, last menstrual period 12/03/2013, SpO2 99 %. No results found. Recent Labs    01/13/21 0509  WBC 8.6  HGB 11.5*  HCT 31.0*  PLT 88*    Recent Labs    01/12/21 0424 01/13/21 0509  NA 144 135  K 3.7 4.1  CL 111 108  CO2 19* 23  GLUCOSE 124* 209*  BUN 14 10  CREATININE 0.69 0.86  CALCIUM 9.7 8.6*     Intake/Output Summary (Last 24 hours) at 01/14/2021 1056 Last data filed at 01/14/2021 0752 Gross per 24 hour  Intake 380 ml  Output --  Net 380 ml         Physical Exam: BP (!) 135/58 (BP Location: Left Arm)   Pulse 72   Temp 98.6 F (37 C) (Oral)   Resp 20   Ht 4' 10"  (1.473 m)   Wt 62.2 kg   LMP 12/03/2013 (LMP Unknown)   SpO2 99%   BMI 28.66 kg/m  Constitutional: No distress . Vital signs reviewed. HENT: Normocephalic.  Atraumatic. Eyes: Disconjugate gaze. No discharge. Cardiovascular: No JVD.  RRR. Respiratory: Normal effort.  No stridor.  Bilateral clear to auscultation. GI: Non-distended.  BS +. Skin: Warm and dry.  Intact. Psych: Normal mood.  Normal behavior. Musc: No edema in extremities.  No tenderness in extremities. Neuro: Alert Dysarthria, stable Motor: RUE: 4 -/5 proximal distal RLE: 4/5 proximal distal Sensation diminished to light touch right hand   Assessment/Plan: 1. Functional deficits which require 3+ hours per day of interdisciplinary therapy in a comprehensive inpatient rehab setting. Physiatrist is providing close team supervision and 24 hour management  of active medical problems listed below. Physiatrist and rehab team continue to assess barriers to discharge/monitor patient progress toward functional and medical goals   Care Tool:  Bathing    Body parts bathed by patient: Right arm, Left arm, Chest, Abdomen, Right upper leg, Left upper leg, Face (simulated)   Body parts bathed by helper: Front perineal area, Buttocks, Right lower leg, Left lower leg     Bathing assist Assist Level: Maximal Assistance - Patient 24 - 49%     Upper Body Dressing/Undressing Upper body dressing   What is the patient wearing?: Pull over shirt    Upper body assist Assist Level: Moderate Assistance - Patient 50 - 74%    Lower Body Dressing/Undressing Lower body dressing      What is the patient wearing?: Incontinence brief     Lower body assist Assist for lower body dressing: Maximal Assistance - Patient 25 - 49%     Toileting Toileting    Toileting assist Assist for toileting: Moderate Assistance - Patient 50 - 74%     Transfers Chair/bed transfer  Transfers assist     Chair/bed transfer assist level: Moderate Assistance - Patient 50 - 74% (with HHA)     Locomotion Ambulation   Ambulation assist   Ambulation activity did not occur: Safety/medical concerns (pt became emotional, did not have time for further mobility)  Assist level:  Moderate Assistance - Patient 50 - 74% Assistive device: Hand held assist Max distance: 47f   Walk 10 feet activity   Assist  Walk 10 feet activity did not occur: Safety/medical concerns        Walk 50 feet activity   Assist Walk 50 feet with 2 turns activity did not occur: Safety/medical concerns         Walk 150 feet activity   Assist Walk 150 feet activity did not occur: Safety/medical concerns         Walk 10 feet on uneven surface  activity   Assist Walk 10 feet on uneven surfaces activity did not occur: Safety/medical concerns          Wheelchair     Assist Is the patient using a wheelchair?: Yes Type of Wheelchair: Manual Wheelchair activity did not occur: Safety/medical concerns         Wheelchair 50 feet with 2 turns activity    Assist    Wheelchair 50 feet with 2 turns activity did not occur: Safety/medical concerns       Wheelchair 150 feet activity     Assist  Wheelchair 150 feet activity did not occur: Safety/medical concerns        Medical Problem List and Plan: 1.  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  Antithrombotics: -DVT/anticoagulation: SCDs Pharmaceutical: Other (comment)             -antiplatelet therapy: N/A 3. Pain Management: Tylenol as needed for HA 4. Mood: Provide emotional support             -antipsychotic agents: N/A 5. Neuropsych: This patient is?  Fully capable of making decisions on her own behalf. 6. Skin/Wound Care: Routine skin checks 7. Fluids/Electrolytes/Nutrition: Routine in and outs 8.  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  Neurology follow-up conservative care 9.  Uncontrolled diabetes mellitus with hyperglycemia.  Hemoglobin A1c 10.7.  NovoLog 12 units 3 times daily, Semglee 20 units twice daily.  Diabetic teaching- couldn't afford insulin at home.   Elevated, but appears to be improving on 9/15 10.  Acute on chronic anemia.  Continue iron supplement.    Hemoglobin 11.5 on 9/14  Continue to monitor 11. Constipation: Improving 12. Hiccups- will need to d/w pharmacy if can do baclofen vs thorazine due to NASH 13. Thrombocytopenia  Platelets 88 on 9/14, labs ordered for tomorrow  Continue to monitor 14.  Cervical stenosis  MRI showing multiple abnormalities  Will request neurosurgical consult  LOS: 2 days A FACE TO FACE EVALUATION WAS  PERFORMED  Bretta Fees ALorie Phenix9/15/2022, 10:56 AM

## 2021-01-14 NOTE — Progress Notes (Signed)
Patient ID: Meagan Peck, female   DOB: 02-20-1961, 60 y.o.   MRN: [id number]  Met with patient and spouse in the patient's room. I introduced myself and explained my role in her care. We went over her current medications and discussed diabetes. She has been managing her insulin at home. She stated that she checks her BG once per day. I gave additional educational handouts on Living With Diabetes, Diabetes Mellitus Basics, Diabetes Mellitus and Nutrition Adult, Diabetes Mellitus Action Plan, and Type 2 Diabetes Mellitus Diagnosis Adult. Patient and spouse verbalized understanding. I will continue to monitor patient.  Dorthula Nettles, RN3, BSN, CBIS, [place], [facility], Inpatient Rehabilitation Office [phone] Cell [phone]

## 2021-01-14 NOTE — Care Management (Signed)
[facility]patient [facility] Individual Statement of Services  Patient Name:  SALLYANN KINNAIRD  Date:  01/14/2021  [facility]  Our goal is to provide you with an individualized program based on your diagnosis and situation, designed to meet your specific needs.  With this comprehensive rehabilitation program, you will be expected to participate in at least 3 hours of rehabilitation therapies Monday-Friday, with modified therapy programming on the weekends.  Your rehabilitation program will include the following services:  Physical Therapy (PT), Occupational Therapy (OT), Speech Therapy (ST), 24 hour per day rehabilitation nursing, Therapeutic Recreaction (TR), Psychology, Neuropsychology, Care Coordinator, Rehabilitation Medicine, [place], and Other  Weekly team conferences will be held on Tuesdays to discuss your progress.  Your Inpatient Rehabilitation Care Coordinator will talk with you frequently to get your input and to update you on team discussions.  Team conferences with you and your family in attendance may also be held.  Expected length of stay: 18-21 days  Overall anticipated outcome: Supervision  Depending on your progress and recovery, your program may change. Your Inpatient Rehabilitation Care Coordinator will coordinate services and will keep you informed of any changes. Your Inpatient Rehabilitation Care Coordinator's name and contact numbers are listed  below.  The following services may also be recommended but are not provided by the [facility] will be made to provide these services after discharge if needed.  Arrangements include referral to agencies that provide these services.  Your insurance has been verified to be:  Uninsured  Your primary doctor  is:  No PCP  Pertinent information will be shared with your doctor and your insurance company.  Inpatient Rehabilitation Care Coordinator:  Cathleen Corti [phone] or ([phone]  Information discussed with and copy given to patient by: Rana Snare, 01/14/2021, 10:14 AM

## 2021-01-14 NOTE — Progress Notes (Signed)
Physical Therapy Session Note  Patient Details  Name: Meagan Peck MRN: [id number] Date of Birth: 1961-04-25  Today's Date: 01/14/2021 PT Individual Time: 4431-5400 PT Individual Time Calculation (min): 72 min   Short Term Goals: Week 1:  PT Short Term Goal 1 (Week 1): Pt will initiate gait training PT Short Term Goal 2 (Week 1): Pt will transfer with min A or better consistently PT Short Term Goal 3 (Week 1): Pt will maintain sitting balance without UE suppoty x 2 min  Skilled Therapeutic Interventions/Progress Updates: Pt presented in bed agreeable to therapy. Pt states some pain in R hand however received meds prior to session. Pt performed supine to sit with use of bed features, CGA and increased time. Pt then performed stand pivot transfer with modA without AD as RW was too tall for pt. Pt then transported to rehab gym and PTA obtained 16x16 w/c and youth RW. Pt then performed stand pivot transfer with RW to 16x16 with CGA. Pt then performed ambulatory transfer to high/low mat CGA. At mat pt participated in toe taps to cones with CGA for weight shifting and forced use of RLE. Pt then participated in gait training with RW and CGA 11f and 226frespectively. Pt required multimodal cues for improved erect posture and to improve heel strike on RLE. Pt also required seated rest between bouts due to fatigue. Once pt back in w/c pt participated in several standing bouts of horseshoes for forced use of RUE and dynamic balance with small challenges. Pt transported back to room for energy conservation and agreeable to remain in w/c until next session (approx 30 min). Pt left in w/c with belt alarm on, call bell within reach and needs met.      Therapy Documentation Precautions:  Precautions Precautions: Fall Precaution Comments: R sided weakness, fall risk Restrictions Weight Bearing Restrictions: No   Therapy/Group: Individual Therapy  Ki Luckman 01/14/2021, 12:17 PM

## 2021-01-14 NOTE — Progress Notes (Signed)
Occupational Therapy Session Note  Patient Details  Name: Meagan Peck MRN: [id number] Date of Birth: 1961-03-20  Today's Date: 01/14/2021 OT Individual Time: 1740-8144 OT Individual Time Calculation (min): 69 min    Short Term Goals: Week 1:  OT Short Term Goal 1 (Week 1): Pt will complete BSC/toilet transfer Min A of 1 consistently OT Short Term Goal 2 (Week 1): Pt will perform LB dress at sit <> stand Mod A with AE PRN OT Short Term Goal 3 (Week 1): Pt will perform self feeding with AE PRN with Min A  Skilled Therapeutic Interventions/Progress Updates:    Pt resting in w/c upon arrival and agreeable to therapy. Pt declined bathing and changing clothing until husband brings in personal clothing in afternoon. Pt agreeable to shower tomorrow. PT practiced TTB tranfsers with CGA/min A. Pt transitioned to Day Room for table activities with focus on RUE control/dexterity. Initial activity with large pegs and peg board. Pt completed placing pegs and removing with 3 rest breaks. Pt noted with RUE ataxia but not noted in LUE. Pt issued tan theraputty and instructed in activities for strengthening and dexterity. Handout not provided at this time. Pt returned to room and remained in w/c with all needs within reach and belt alarm activated.  Therapy Documentation Precautions:  Precautions Precautions: Fall Precaution Comments: R sided weakness, fall risk Restrictions Weight Bearing Restrictions: No  Pain:  Pt c/o Rt hand pain and edema (unrated); activity with some relief   Therapy/Group: Individual Therapy  Leroy Libman 01/14/2021, 12:06 PM

## 2021-01-14 NOTE — Progress Notes (Signed)
Speech Language Pathology Daily Session Note  Patient Details  Name: Meagan Peck MRN: [id number] Date of Birth: 1960-05-05  Today's Date: 01/14/2021 SLP Individual Time: 1400-1445 SLP Individual Time Calculation (min): 45 min  Short Term Goals: Week 1: SLP Short Term Goal 1 (Week 1): Patient will demonstrate adequate toleration of regular solids as evidenced by efficient mastication, oral control and swallow initiation with supervision to minA verbal and demonstration cues. SLP Short Term Goal 2 (Week 1): Patient will return-demonstrate to perform speech strategies (increase volume, emphasize/exaggerate articulatory placement, manner and transitions between phonemes) at phrase and sentence level with minA verbal and modeling cues. SLP Short Term Goal 3 (Week 1): Patient will demonstrate adequate coordination of respiratory support with phonation at phrase and sentence level with minA verbal and modeling cues. SLP Short Term Goal 4 (Week 1): Patient will demonstrate 90% intelligibility of speech at 3-4 sentence oral reading level with minA verbal cues for use of strateiges.  Skilled Therapeutic Interventions:   Patient seen with spouse present for skilled ST session focused on speech and swallow function. Patient did not exhibit any overt s/s aspiration with thin liquids when SLP present in room, however she did report that she had a coughing incident earlier today and she feels she does better with straw sips than cup sips. SLP discussed with patient and spouse and supposed that patient may have had difficulty with oral containment when taking cup sips in addition to possible head tilt positioning due to her difficulties in self feeding which may have led to coughing. SLP plans to revisit this next date. Patient then completed oral reading and semi-structured phrase level verbal production. She demonstrated use of some strategies that SLP introduced previous session (pausing, emphasizing, etc) but  benefited from SLP cues to exaggerate emphasis on medial consonants. SLP then reviewed controlled breathing exercise which patient able to return demonstrate. SLP assisted patient with transfer into bed from [facility] and she was left in bed with spouse present in room, all needs within reach. She continues to benefit from skilled SLP intervention to maximize speech and swallow function prior to discharge.  Pain Pain Assessment Pain Scale: 0-10 Pain Score: 0-No pain  Therapy/Group: Individual Therapy  Sonia Baller, MA, CCC-SLP Speech Therapy

## 2021-01-14 NOTE — Consult Note (Signed)
Reason for Consult:right hand numbness Referring Physician: Delice Lesch  Shelley KIRSTINA LEINWEBER is an 60 y.o. female.  HPI: whom was admitted for generalized weakness, cirrhosis, Osmotic demyelination syndrome. The numbness she first felt last night, not upon admission. Her hand works well according to her, but she was concerned about the numbness. Initially admitted to the hospital on 01/08/21 for slurred speech and confusion. She is in poor overall health with multiple comorbitities.  Past Medical History:  Diagnosis Date   Acute medial meniscus tear of right knee 12/10/2015   Allergy    Anasarca    Arthritis    bil feet   Ascites    Cataract    bilateral - surgery to remove   Cirrhosis ([place])    Diabetes mellitus without complication ([place])    type 2 - last a1c was 5.2 per patient, no meds   Dyspnea    when I have too much fluid   Esophageal varices ([facility])    GERD (gastroesophageal reflux disease)    Heart murmur    Hepatic cirrhosis ([facility])    HLD (hyperlipidemia)    no meds   Hypertension    no meds   Iron deficiency anemia 09/26/2018    Past Surgical History:  Procedure Laterality Date   BIOPSY  08/20/2019   Procedure: BIOPSY;  Surgeon: Jackquline Denmark, MD;  Location: [facility];  Service: Gastroenterology;;   CARPAL TUNNEL RELEASE Right    CESAREAN SECTION     CHOLECYSTECTOMY     COLONOSCOPY  08/2019   ESOPHAGEAL BANDING N/A 09/19/2019   Procedure: ESOPHAGEAL BANDING;  Surgeon: Yetta Flock, MD;  Location: [facility];  Service: Gastroenterology;  Laterality: N/A;   ESOPHAGOGASTRODUODENOSCOPY (EGD) WITH PROPOFOL N/A 08/20/2019   Procedure: ESOPHAGOGASTRODUODENOSCOPY (EGD) WITH PROPOFOL;  Surgeon: Jackquline Denmark, MD;  Location: [facility];  Service: Gastroenterology;  Laterality: N/A;   ESOPHAGOGASTRODUODENOSCOPY (EGD) WITH PROPOFOL N/A 09/19/2019   Procedure: ESOPHAGOGASTRODUODENOSCOPY (EGD) WITH PROPOFOL;  Surgeon: Yetta Flock, MD;  [facility]: [facility];   Service: Gastroenterology;  Laterality: N/A;   ESOPHAGOGASTRODUODENOSCOPY (EGD) WITH PROPOFOL N/A 01/16/2020   Procedure: ESOPHAGOGASTRODUODENOSCOPY (EGD) WITH PROPOFOL;  Surgeon: Jerene Bears, MD;  Location: [facility];  Service: Endoscopy;  Laterality: N/A;   EYE SURGERY Bilateral    removed cataracts   GASTRIC VARICES BANDING N/A 08/20/2019   Procedure: GASTRIC VARICES BANDING;  Surgeon: Jackquline Denmark, MD;  Location: [facility];  Service: Gastroenterology;  Laterality: N/A;   HOT HEMOSTASIS N/A 01/16/2020   Procedure: HOT HEMOSTASIS (ARGON PLASMA COAGULATION/BICAP);  Surgeon: Jerene Bears, MD;  Location: [facility];  Service: Endoscopy;  Laterality: N/A;   IR PARACENTESIS  08/01/2019   IR PARACENTESIS  08/19/2019   IR PARACENTESIS  09/09/2019   IR PARACENTESIS  09/23/2019   IR PARACENTESIS  10/08/2019   IR PARACENTESIS  10/23/2019   IR PARACENTESIS  11/12/2019   IR PARACENTESIS  11/21/2019   IR PARACENTESIS  11/25/2019   IR PARACENTESIS  12/05/2019   IR PARACENTESIS  12/13/2019   IR PARACENTESIS  12/20/2019   IR PARACENTESIS  12/26/2019   IR PARACENTESIS  12/31/2019   IR PARACENTESIS  01/07/2020   IR PARACENTESIS  01/10/2020   IR PARACENTESIS  01/13/2020   IR PARACENTESIS  01/14/2020   IR PARACENTESIS  01/20/2020   IR PARACENTESIS  01/30/2020   IR PARACENTESIS  02/24/2020   IR RADIOLOGIST EVAL & MGMT  10/30/2019   IR RADIOLOGIST EVAL & MGMT  12/11/2019   IR RADIOLOGIST EVAL &  MGMT  12/24/2019   IR RADIOLOGIST EVAL & MGMT  03/05/2020   IR RADIOLOGIST EVAL & MGMT  05/15/2020   IR TIPS  11/25/2019   IR TIPS  01/14/2020   KNEE ARTHROSCOPY Right 12/10/2015   Procedure: RIGHT KNEE ARTHROSCOPY WITH PARTIAL MEDIAL MENISCECTOMY;  Surgeon: Mcarthur Rossetti, MD;  Location: [facility];  Service: Orthopedics;  Laterality: Right;   KNEE ARTHROSCOPY W/ MENISCAL REPAIR Bilateral    MENISCUS REPAIR Left    RADIOLOGY WITH ANESTHESIA N/A 11/25/2019   Procedure: TIPS;  Surgeon: Corrie Mckusick, DO;  Location: [facility];   Service: Anesthesiology;  Laterality: N/A;   RADIOLOGY WITH ANESTHESIA N/A 01/14/2020   Procedure: TIPS;  Surgeon: Corrie Mckusick, DO;  Location: [facility];  Service: Anesthesiology;  Laterality: N/A;    Family History  Problem Relation Age of Onset   Hypertension Mother    COPD Mother    Alcoholism Father    Heart attack Father    Diabetes Brother    Diabetes Paternal Grandmother    Heart disease Paternal Grandmother    Alcoholism Paternal Uncle    Alcoholism Maternal Uncle    Heart disease Maternal Grandmother    Heart disease Maternal Grandfather    Heart disease Paternal Grandfather     Social History:  reports that she has never smoked. She has never used smokeless tobacco. She reports that she does not drink alcohol and does not use drugs.  Allergies:  Allergies  Allergen Reactions   Penicillins Anaphylaxis and Other (See Comments)    Happened as a young child   Atrovent [Ipratropium] Palpitations   Naproxen Rash   Quinine Derivatives Rash   Sulfa Antibiotics Rash    Medications: I have reviewed the patient's current medications.  Results for orders placed or performed during the hospital encounter of 01/12/21 (from the past 48 hour(s))  Glucose, capillary     Status: Abnormal   Collection Time: 01/12/21  8:47 PM  Result Value Ref Range   Glucose-Capillary 246 (H) 70 - 99 mg/dL    Comment: Glucose reference range applies only to samples taken after fasting for at least 8 hours.  Comprehensive metabolic panel     Status: Abnormal   Collection Time: 01/13/21  5:09 AM  Result Value Ref Range   Sodium 135 135 - 145 mmol/L   Potassium 4.1 3.5 - 5.1 mmol/L   Chloride 108 98 - 111 mmol/L   CO2 23 22 - 32 mmol/L   Glucose, Bld 209 (H) 70 - 99 mg/dL    Comment: Glucose reference range applies only to samples taken after fasting for at least 8 hours.   BUN 10 6 - 20 mg/dL   Creatinine, Ser 0.86 0.44 - 1.00 mg/dL   Calcium 8.6 (L) 8.9 - 10.3 mg/dL   Total Protein 4.3 (L)  6.5 - 8.1 g/dL   Albumin 2.1 (L) 3.5 - 5.0 g/dL   AST 78 (H) 15 - 41 U/L   ALT 41 0 - 44 U/L   Alkaline Phosphatase 92 38 - 126 U/L   Total Bilirubin 5.2 (H) 0.3 - 1.2 mg/dL   GFR, Estimated >60 >60 mL/min    Comment: (NOTE) Calculated using the CKD-EPI Creatinine Equation (2021)    Anion gap 4 (L) 5 - 15    Comment: Performed at [facility], [place] [address] [place], [place] [address]  CBC WITH DIFFERENTIAL     Status: Abnormal   Collection Time: 01/13/21  5:09 AM  Result  Value Ref Range   WBC 8.6 4.0 - 10.5 K/uL   RBC 3.37 (L) 3.87 - 5.11 MIL/uL   Hemoglobin 11.5 (L) 12.0 - 15.0 g/dL   HCT 31.0 (L) 36.0 - 46.0 %   MCV 92.0 80.0 - 100.0 fL   MCH 34.1 (H) 26.0 - 34.0 pg   MCHC 37.1 (H) 30.0 - 36.0 g/dL   RDW 14.7 11.5 - 15.5 %   Platelets 88 (L) 150 - 400 K/uL    Comment: Immature Platelet Fraction may be clinically indicated, consider ordering this additional test [id number] CONSISTENT WITH PREVIOUS RESULT REPEATED TO VERIFY    nRBC 0.0 0.0 - 0.2 %   Neutrophils Relative % 72 %   Neutro Abs 6.1 1.7 - 7.7 K/uL   Lymphocytes Relative 15 %   Lymphs Abs 1.3 0.7 - 4.0 K/uL   Monocytes Relative 7 %   Monocytes Absolute 0.6 0.1 - 1.0 K/uL   Eosinophils Relative 4 %   Eosinophils Absolute 0.4 0.0 - 0.5 K/uL   Basophils Relative 1 %   Basophils Absolute 0.1 0.0 - 0.1 K/uL   Immature Granulocytes 1 %   Abs Immature Granulocytes 0.05 0.00 - 0.07 K/uL    Comment: Performed at [facility], [address], [place], [place] [address]  Glucose, capillary     Status: Abnormal   Collection Time: 01/13/21  6:03 AM  Result Value Ref Range   Glucose-Capillary 221 (H) 70 - 99 mg/dL    Comment: Glucose reference range applies only to samples taken after fasting for at least 8 hours.  Glucose, capillary     Status: Abnormal   Collection Time: 01/13/21 11:53 AM  Result Value Ref Range   Glucose-Capillary 226 (H) 70 - 99 mg/dL    Comment: Glucose reference range applies only  to samples taken after fasting for at least 8 hours.   Comment 1 Notify RN   Glucose, capillary     Status: Abnormal   Collection Time: 01/13/21  4:58 PM  Result Value Ref Range   Glucose-Capillary 224 (H) 70 - 99 mg/dL    Comment: Glucose reference range applies only to samples taken after fasting for at least 8 hours.  Glucose, capillary     Status: Abnormal   Collection Time: 01/13/21  8:37 PM  Result Value Ref Range   Glucose-Capillary 219 (H) 70 - 99 mg/dL    Comment: Glucose reference range applies only to samples taken after fasting for at least 8 hours.  Glucose, capillary     Status: Abnormal   Collection Time: 01/14/21  5:56 AM  Result Value Ref Range   Glucose-Capillary 135 (H) 70 - 99 mg/dL    Comment: Glucose reference range applies only to samples taken after fasting for at least 8 hours.  Glucose, capillary     Status: Abnormal   Collection Time: 01/14/21 11:49 AM  Result Value Ref Range   Glucose-Capillary 245 (H) 70 - 99 mg/dL    Comment: Glucose reference range applies only to samples taken after fasting for at least 8 hours.  Glucose, capillary     Status: Abnormal   Collection Time: 01/14/21  4:34 PM  Result Value Ref Range   Glucose-Capillary 253 (H) 70 - 99 mg/dL    Comment: Glucose reference range applies only to samples taken after fasting for at least 8 hours.    No results found.  Review of Systems Blood pressure 125/66, pulse 81, temperature 98.9 F (37.2 C), resp.  rate 18, height 4' 10"  (1.473 m), weight 62.2 kg, last menstrual period 12/03/2013, SpO2 100 %. Physical Exam Constitutional:      General: She is not in acute distress.    Appearance: She is ill-appearing.     Comments: jaundiced  HENT:     Head: Normocephalic and atraumatic.     Right Ear: External ear normal.     Left Ear: External ear normal.     Nose: Nose normal.     Mouth/Throat:     Mouth: Mucous membranes are moist.     Pharynx: Oropharynx is clear.  Eyes:     Extraocular  Movements: Extraocular movements intact.     Conjunctiva/sclera: Conjunctivae normal.     Pupils: Pupils are equal, round, and reactive to light.  Cardiovascular:     Rate and Rhythm: Normal rate and regular rhythm.  Pulmonary:     Effort: Pulmonary effort is normal.  Abdominal:     General: There is distension.     Tenderness: There is no abdominal tenderness. There is no guarding.  Musculoskeletal:        General: Normal range of motion.     Cervical back: Normal range of motion.     Comments: Well healed CTR incision on right wrist.   Skin:    General: Skin is warm and dry.     Coloration: Skin is jaundiced.  Neurological:     Mental Status: She is alert and oriented to person, place, and time. Mental status is at baseline.     Cranial Nerves: No cranial nerve deficit.     Sensory: Sensation is intact.     Motor: Weakness present.     Coordination: Coordination normal.     Comments: Normal pin prick in palms, normal hot and cold perception in both hands, normal proprioception in upper extremities.  Positive tinel's sign right and left cubital tunnels. Postive tinel's sign at left carpal tunnel.   Psychiatric:        Mood and Affect: Mood normal.        Behavior: Behavior normal.        Thought Content: Thought content normal.        Judgment: Judgment normal.   MRI results show cervical stenosis. There is central stenosis at C4/5, not due to a herniated disc but osteophytes.  No abnormal cord signal. Alignment is normal. Abnormal signal in the pons Assessment/Plan: Anavictoria P Satcher is a 60 y.o. female Admitted for altered mental status. He has been diagnosed with osmotic demyelination syndrome. She developed numbness by perception last night. On exam she has a normal exam. She is not numb. There is no indication for emergent, or urgent operative intervention due to the findings on the MRI which are chronic in nature. Osteophytes are prominent at C4/5,5/6, and 6/7. Disc  degeneration also present at these same levels providing further evidence of long standing changes. The hand numbness has been present for less than 24 hours. Recommend further observation. No changes in strength of her right or left hand. No objective signs of numbness on my exam in right hand.   Ashok Pall 01/14/2021, 7:35 PM

## 2021-01-15 DIAGNOSIS — E8809 Other disorders of plasma-protein metabolism, not elsewhere classified: Secondary | ICD-10-CM

## 2021-01-15 DIAGNOSIS — R7401 Elevation of levels of liver transaminase levels: Secondary | ICD-10-CM

## 2021-01-15 DIAGNOSIS — E46 Unspecified protein-calorie malnutrition: Secondary | ICD-10-CM

## 2021-01-15 LAB — GLUCOSE, CAPILLARY
Glucose-Capillary: 164 mg/dL — ABNORMAL HIGH (ref 70–99)
Glucose-Capillary: 195 mg/dL — ABNORMAL HIGH (ref 70–99)
Glucose-Capillary: 236 mg/dL — ABNORMAL HIGH (ref 70–99)
Glucose-Capillary: 89 mg/dL (ref 70–99)

## 2021-01-15 LAB — CBC WITH DIFFERENTIAL/PLATELET
Abs Immature Granulocytes: 0.02 10*3/uL (ref 0.00–0.07)
Basophils Absolute: 0.1 10*3/uL (ref 0.0–0.1)
Basophils Relative: 1 %
Eosinophils Absolute: 0.3 10*3/uL (ref 0.0–0.5)
Eosinophils Relative: 6 %
HCT: 29.7 % — ABNORMAL LOW (ref 36.0–46.0)
Hemoglobin: 10.7 g/dL — ABNORMAL LOW (ref 12.0–15.0)
Immature Granulocytes: 0 %
Lymphocytes Relative: 31 %
Lymphs Abs: 1.6 10*3/uL (ref 0.7–4.0)
MCH: 34 pg (ref 26.0–34.0)
MCHC: 36 g/dL (ref 30.0–36.0)
MCV: 94.3 fL (ref 80.0–100.0)
Monocytes Absolute: 0.7 10*3/uL (ref 0.1–1.0)
Monocytes Relative: 13 %
Neutro Abs: 2.6 10*3/uL (ref 1.7–7.7)
Neutrophils Relative %: 49 %
Platelets: 94 10*3/uL — ABNORMAL LOW (ref 150–400)
RBC: 3.15 MIL/uL — ABNORMAL LOW (ref 3.87–5.11)
RDW: 15.2 % (ref 11.5–15.5)
WBC: 5.2 10*3/uL (ref 4.0–10.5)
nRBC: 0 % (ref 0.0–0.2)

## 2021-01-15 MED ORDER — INSULIN ASPART 100 UNIT/ML IJ SOLN
14.0000 [IU] | Freq: Three times a day (TID) | INTRAMUSCULAR | Status: DC
  Administered 2021-01-15 – 2021-02-01 (×29): 14 [IU] via SUBCUTANEOUS

## 2021-01-15 NOTE — Progress Notes (Signed)
Speech Language Pathology Daily Session Note  Patient Details  Name: Meagan Peck MRN: [id number] Date of Birth: 1961/02/25  Today's Date: 01/15/2021 SLP Individual Time: 1305-1400 SLP Individual Time Calculation (min): 55 min  Short Term Goals: Week 1: SLP Short Term Goal 1 (Week 1): Patient will demonstrate adequate toleration of regular solids as evidenced by efficient mastication, oral control and swallow initiation with supervision to minA verbal and demonstration cues. SLP Short Term Goal 2 (Week 1): Patient will return-demonstrate to perform speech strategies (increase volume, emphasize/exaggerate articulatory placement, manner and transitions between phonemes) at phrase and sentence level with minA verbal and modeling cues. SLP Short Term Goal 3 (Week 1): Patient will demonstrate adequate coordination of respiratory support with phonation at phrase and sentence level with minA verbal and modeling cues. SLP Short Term Goal 4 (Week 1): Patient will demonstrate 90% intelligibility of speech at 3-4 sentence oral reading level with minA verbal cues for use of strateiges.  Skilled Therapeutic Interventions:   Patient seen for skilled ST session focusing on speech and voice function. SLP verified with MD that patient could tolerate EMST and assessment was completed. MEP was 33, MIP was 33. Patient started on EMST trainer set at 46. She required rest break of at least 10 seconds between each rep. She will require further education and training regarding judging difficulty as she would judge as 2 or 3 of 10 (10 being most difficult) when SLP perceived difficulty of 8/9. Patient reported being tired from today's therapy but was able to maintain alertness throughout. Speech was approximately 80% intelligible during conversation with SLP. She was left in bed with all needs in reach. Patient continues to benefit from skilled SLP intervention to maximize speech/voice function prior to  discharge.  Pain Pain Assessment Pain Scale: 0-10 Pain Score: 0-No pain  Therapy/Group: Individual Therapy  Sonia Baller, MA, CCC-SLP Speech Therapy

## 2021-01-15 NOTE — Progress Notes (Signed)
Physical Therapy Session Note  Patient Details  Name: Meagan Peck MRN: [id number] Date of Birth: 1960/10/22  Today's Date: 01/15/2021 PT Individual Time: 1050-1205 PT Individual Time Calculation (min): 75 min   Short Term Goals: Week 1:  PT Short Term Goal 1 (Week 1): Pt will initiate gait training PT Short Term Goal 2 (Week 1): Pt will transfer with min A or better consistently PT Short Term Goal 3 (Week 1): Pt will maintain sitting balance without UE suppoty x 2 min  Skilled Therapeutic Interventions/Progress Updates: Pt presented in bed agreeable to therapy. Pt states some mild pain in R hand but improving and already pre-medicated. Pt mentions that difficult to stay in w/c and head was "wobbling", PTA obtained recliner for pt as one not in room. Performed bed mobility with supervision and use of bed features. Performed stand pivot transfer with RW to w/c CGA. Pt transported to day room and participated in Gratz balance assessment Patient demonstrates increased fall risk as noted by score of   18/56 on Berg Balance Scale.  (<36= high risk for falls, close to 100%; 37-45 significant >80%; 46-51 moderate >50%; 52-55 lower >25%). Pt noted throughout test to have mild posterior sway but was able to correct with multimodal cues. Pt also participated in x 2 bouts of cornhole with RW for dynamic balance. Pt noted to have increased sway with fatigue and required intermittent multimodal cues for increased weight in forefeet due to posterior bias. During rest between cornhole bouts pt participated in theraputic activity of opening/closing medicine bottles. Pt then ambulated ~59f with RW and w/c follow towards room. As pt became fatigued pt noted to have one occurrence of R knee instability but pt was able to recovery without assist. Pt was transported remaining distance back to room and performed ambulatory transfer to recliner. Pt left in recliner at end of session with seat alarm on, call bell within  reach and needs met.       Therapy Documentation Precautions:  Precautions Precautions: Fall Precaution Comments: R sided weakness, fall risk Restrictions Weight Bearing Restrictions: No General:   Vital Signs:   Pain:   Mobility:   Locomotion :    Trunk/Postural Assessment :    Balance: Balance Balance Assessed: Yes Standardized Balance Assessment Standardized Balance Assessment: Berg Balance Test Berg Balance Test Sit to Stand: Able to stand using hands after several tries Standing Unsupported: Able to stand 30 seconds unsupported Sitting with Back Unsupported but Feet Supported on Floor or Stool: Able to sit safely and securely 2 minutes Stand to Sit: Controls descent by using hands Transfers: Needs one person to assist Standing Unsupported with Eyes Closed: Able to stand 3 seconds Standing Ubsupported with Feet Together: Needs help to attain position but able to stand for 30 seconds with feet together From Standing, Reach Forward with Outstretched Arm: Reaches forward but needs supervision From Standing Position, Pick up Object from Floor: Unable to try/needs assist to keep balance From Standing Position, Turn to Look Behind Over each Shoulder: Needs supervision when turning Turn 360 Degrees: Needs assistance while turning Standing Unsupported, Alternately Place Feet on Step/Stool: Needs assistance to keep from falling or unable to try Standing Unsupported, One Foot in Front: Needs help to step but can hold 15 seconds Standing on One Leg: Unable to try or needs assist to prevent fall Total Score: 18 Exercises:   Other Treatments:      Therapy/Group: Individual Therapy  Ernesha Ramone 01/15/2021, 12:47 PM

## 2021-01-15 NOTE — Progress Notes (Signed)
Occupational Therapy Session Note  Patient Details  Name: Meagan Peck MRN: [id number] Date of Birth: 03-Oct-1960  Today's Date: 01/15/2021 OT Individual Time: 3612-2449 OT Individual Time Calculation (min): 68 min    Short Term Goals: Week 1:  OT Short Term Goal 1 (Week 1): Pt will complete BSC/toilet transfer Min A of 1 consistently OT Short Term Goal 2 (Week 1): Pt will perform LB dress at sit <> stand Mod A with AE PRN OT Short Term Goal 3 (Week 1): Pt will perform self feeding with AE PRN with Min A  Skilled Therapeutic Interventions/Progress Updates:     Pt received semi-reclined in bed with nursing staff present, denies pain, agreeable to therapy. Session focus on self-care retraining, activity tolerance, RUE NMR, func transfers in prep for improved ADL/IADL/func mobility performance + decreased caregiver burden. Nursing student present throughout for education with consent from  pt. Expressing interest in showering. Came to sitting EOB with min A to scoot hips forward + use of bed rail.Maintained static sitting with B foot support on foot rest while nursing administered morning rx with S. Stand-pivot > w/c on L with min HHA. Stand-pivot with CGA + BUE support on grab bar to enter/exit shower. Doffed pants/brief with min A to remove from L ankle. Able to remove socks with S, doffed shirt with min A to pull overhead. Utilized trash can for B foot support 2/2 pt's stature. Bathed full-body with min A for periarea thoroughness upon exiting shower. Utilized RUE dominantly to hold shower head and scrub hair. At sink, req min A to thread RLE into brief and to pull up brief/pants over R hip. CGA for STS at sink. Donned gown with min A to thread RUE. Opted to comb hair 2/2 fatigue in seated position, able to utilize BUE to comb thoroughly with increased time.   MD in/out assessment.  Completed the following exercises with least resistive theraputty with RUE 1x5: rolling into log, putty squeezes,  and removing beads. Stand-pivot back to bed with min HHA. Returned to supine CGA and able to boost up herself with ind.   Pt left semi-reclined in bed with EVS present with bed alarm engaged, call bell in reach, and all immediate needs met.   Therapy Documentation Precautions:  Precautions Precautions: Fall Precaution Comments: R sided weakness, fall risk Restrictions Weight Bearing Restrictions: No  Pain:  denies ADL: See Care Tool for more details.  Therapy/Group: Individual Therapy  Volanda Napoleon MS, OTR/L  01/15/2021, 6:48 AM

## 2021-01-15 NOTE — Progress Notes (Signed)
[facility] PHYSICAL MEDICINE & REHABILITATION PROGRESS NOTE  Subjective/Complaints: Patient seen sitting up in bed this morning.  She states she slept well overnight.  She is working with therapies.  She notes that she has some numbness in her right hand.  She was seen by neurosurgery yesterday, notes reviewed.  ROS: Denies CP, SOB, N/V/D  Objective: Vital Signs: Blood pressure 128/60, pulse 72, temperature 98 F (36.7 C), resp. rate 16, height 4' 10"  (1.473 m), weight 62.2 kg, last menstrual period 12/03/2013, SpO2 98 %. No results found. Recent Labs    01/13/21 0509 01/15/21 0511  WBC 8.6 5.2  HGB 11.5* 10.7*  HCT 31.0* 29.7*  PLT 88* 94*    Recent Labs    01/13/21 0509  NA 135  K 4.1  CL 108  CO2 23  GLUCOSE 209*  BUN 10  CREATININE 0.86  CALCIUM 8.6*     Intake/Output Summary (Last 24 hours) at 01/15/2021 1148 Last data filed at 01/15/2021 0700 Gross per 24 hour  Intake 480 ml  Output --  Net 480 ml         Physical Exam: BP 128/60 (BP Location: Left Arm)   Pulse 72   Temp 98 F (36.7 C)   Resp 16   Ht 4' 10"  (1.473 m)   Wt 62.2 kg   LMP 12/03/2013 (LMP Unknown)   SpO2 98%   BMI 28.66 kg/m  Constitutional: No distress . Vital signs reviewed. HENT: Normocephalic.  Atraumatic. Eyes: Disconjugate gaze. No discharge. Cardiovascular: No JVD.  RRR. Respiratory: Normal effort.  No stridor.  Bilateral clear to auscultation. GI: Non-distended.  BS +. Skin: Warm and dry.  Intact. Psych: Normal mood.  Normal behavior. Musc: No edema in extremities.  No tenderness in extremities. Neuro: Alert Dysarthria, unchanged Motor: RUE: 4 -/5 proximal distal RLE: 4/5 proximal distal Sensation diminished to light touch right hand, stable   Assessment/Plan: 1. Functional deficits which require 3+ hours per day of interdisciplinary therapy in a comprehensive inpatient rehab setting. Physiatrist is providing close team supervision and 24 hour management of active  medical problems listed below. Physiatrist and rehab team continue to assess barriers to discharge/monitor patient progress toward functional and medical goals   Care Tool:  Bathing    Body parts bathed by patient: Right arm, Left arm, Chest, Abdomen, Right upper leg, Left upper leg, Face (simulated)   Body parts bathed by helper: Front perineal area, Buttocks, Right lower leg, Left lower leg     Bathing assist Assist Level: Maximal Assistance - Patient 24 - 49%     Upper Body Dressing/Undressing Upper body dressing   What is the patient wearing?: Pull over shirt    Upper body assist Assist Level: Moderate Assistance - Patient 50 - 74%    Lower Body Dressing/Undressing Lower body dressing      What is the patient wearing?: Incontinence brief     Lower body assist Assist for lower body dressing: Maximal Assistance - Patient 25 - 49%     Toileting Toileting    Toileting assist Assist for toileting: Moderate Assistance - Patient 50 - 74%     Transfers Chair/bed transfer  Transfers assist     Chair/bed transfer assist level: Contact Guard/Touching assist     Locomotion Ambulation   Ambulation assist   Ambulation activity did not occur: Safety/medical concerns (pt became emotional, did not have time for further mobility)  Assist level: Contact Guard/Touching assist Assistive device: Walker-rolling Max distance: 28f  Walk 10 feet activity   Assist  Walk 10 feet activity did not occur: Safety/medical concerns  Assist level: Contact Guard/Touching assist Assistive device: Walker-rolling   Walk 50 feet activity   Assist Walk 50 feet with 2 turns activity did not occur: Safety/medical concerns         Walk 150 feet activity   Assist Walk 150 feet activity did not occur: Safety/medical concerns         Walk 10 feet on uneven surface  activity   Assist Walk 10 feet on uneven surfaces activity did not occur: Safety/medical concerns          Wheelchair     Assist Is the patient using a wheelchair?: Yes Type of Wheelchair: Manual Wheelchair activity did not occur: Safety/medical concerns         Wheelchair 50 feet with 2 turns activity    Assist    Wheelchair 50 feet with 2 turns activity did not occur: Safety/medical concerns       Wheelchair 150 feet activity     Assist  Wheelchair 150 feet activity did not occur: Safety/medical concerns        Medical Problem List and Plan: 1.  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  Antithrombotics: -DVT/anticoagulation: SCDs Pharmaceutical: Other (comment)             -antiplatelet therapy: N/A 3. Pain Management: Tylenol as needed for HA 4. Mood: Provide emotional support             -antipsychotic agents: N/A 5. Neuropsych: This patient is?  Fully capable of making decisions on her own behalf. 6. Skin/Wound Care: Routine skin checks 7. Fluids/Electrolytes/Nutrition: Routine in and outs 8.  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  Neurology follow-up conservative care 9.  Uncontrolled diabetes mellitus with hyperglycemia.  Hemoglobin A1c 10.7.   NovoLog 12 units 3 times daily, increase to 14 units on 9/16 Semglee 20 units twice daily.  Diabetic teaching- couldn't afford insulin at home.  10.  Acute on chronic anemia.  Continue iron supplement.    Hemoglobin 10.7 on 9/16, labs ordered for Monday  Continue to monitor 11. Constipation: Improving 12. Hiccups-?  Resolved 13. Thrombocytopenia  Platelets 94 on 9/16, labs ordered for Monday  Continue to monitor 14.  Cervical stenosis  MRI showing multiple abnormalities  Appreciate neurosurgery recs, no plans for intervention at present 15.  Transaminitis  LFTs elevated on 9/14, labs ordered for  Monday 16.  Hypoalbuminemia  Supplement initiated   LOS: 3 days A FACE TO FACE EVALUATION WAS PERFORMED  Hermen Mario Lorie Phenix 01/15/2021, 11:48 AM

## 2021-01-15 NOTE — Plan of Care (Signed)
  Problem: Consults Goal: RH GENERAL PATIENT EDUCATION Description: See Patient Education module for education specifics. Outcome: Progressing Goal: Diabetes Guidelines if Diabetic/Glucose > 140 Description: If diabetic or lab glucose is > 140 mg/dl - Initiate Diabetes/Hyperglycemia Guidelines & Document Interventions  Outcome: Progressing   Problem: RH SKIN INTEGRITY Goal: RH STG MAINTAIN SKIN INTEGRITY WITH ASSISTANCE Description: STG Maintain Skin Integrity With Supervision Assistance. Outcome: Progressing   Problem: RH SAFETY Goal: RH STG DECREASED RISK OF FALL WITH ASSISTANCE Description: STG Decreased Risk of Fall With A Supervision ssistance. Outcome: Progressing   Problem: RH PAIN MANAGEMENT Goal: RH STG PAIN MANAGED AT OR BELOW PT'S PAIN GOAL Description: < 3 on a 0-10 pain scale. Outcome: Progressing   Problem: RH KNOWLEDGE DEFICIT GENERAL Goal: RH STG INCREASE KNOWLEDGE OF SELF CARE AFTER HOSPITALIZATION Description: Patient will demonstrate knowledge of medication management, pain management, diabetes management with educational materials and handouts provided by staff independently at discharge. Outcome: Progressing

## 2021-01-16 LAB — GLUCOSE, CAPILLARY
Glucose-Capillary: 109 mg/dL — ABNORMAL HIGH (ref 70–99)
Glucose-Capillary: 277 mg/dL — ABNORMAL HIGH (ref 70–99)
Glucose-Capillary: 170 mg/dL — ABNORMAL HIGH (ref 70–99)
Glucose-Capillary: 137 mg/dL — ABNORMAL HIGH (ref 70–99)

## 2021-01-16 NOTE — Plan of Care (Signed)
  Problem: Consults Goal: RH GENERAL PATIENT EDUCATION Description: See Patient Education module for education specifics. Outcome: Progressing Goal: Diabetes Guidelines if Diabetic/Glucose > 140 Description: If diabetic or lab glucose is > 140 mg/dl - Initiate Diabetes/Hyperglycemia Guidelines & Document Interventions  Outcome: Progressing   Problem: RH SKIN INTEGRITY Goal: RH STG MAINTAIN SKIN INTEGRITY WITH ASSISTANCE Description: STG Maintain Skin Integrity With Supervision Assistance. Outcome: Progressing   Problem: RH SAFETY Goal: RH STG DECREASED RISK OF FALL WITH ASSISTANCE Description: STG Decreased Risk of Fall With A Supervision ssistance. Outcome: Progressing   Problem: RH PAIN MANAGEMENT Goal: RH STG PAIN MANAGED AT OR BELOW PT'S PAIN GOAL Description: < 3 on a 0-10 pain scale. Outcome: Progressing   Problem: RH KNOWLEDGE DEFICIT GENERAL Goal: RH STG INCREASE KNOWLEDGE OF SELF CARE AFTER HOSPITALIZATION Description: Patient will demonstrate knowledge of medication management, pain management, diabetes management with educational materials and handouts provided by staff independently at discharge. Outcome: Progressing

## 2021-01-16 NOTE — Progress Notes (Signed)
Physical Therapy Session Note  Patient Details  Name: Meagan Peck MRN: [id number] Date of Birth: [birth year]  Today's Date: 01/16/2021 PT Individual Time: 1000-1100; 1530-1615 PT Individual Time Calculation (min): 60 min and 45 min  Short Term Goals: Week 1:  PT Short Term Goal 1 (Week 1): Pt will initiate gait training PT Short Term Goal 2 (Week 1): Pt will transfer with min A or better consistently PT Short Term Goal 3 (Week 1): Pt will maintain sitting balance without UE suppoty x 2 min  Skilled Therapeutic Interventions/Progress Updates:    Session 1: Pt received seated in recliner in room, agreeable to PT session. No complaints of pain. Sit to stand with min HHA and/or use of RW throughout session. Stand pivot transfers with min HHA throughout session. Attempt gait with min HHA and no AD, pt has near LOB and becomes tearful and very fearful of falling, able to safely ambulate x 25 ft to mat table with min HHA and encouragement. Provided RW for further ambulation for safety. Ambulation x 50 ft with min A and RW, decreased step length, decreased gait speed, path deviation to the L, L lateral lean, and elevated shoulders due to RW height. Unable to adjust RW any shorter to be appropriate height for patient. Standing alt L/R 4" step taps with RW and min A for balance, 2 x 12 reps. Standing balance performing bean bag toss with one UE on RW, min A for balance, focus on reaching outside BOS for bean bags prior to tossing, 4 x 15 reps. Standing alt L/R 3" step-ups with BUE support and min A for balance, 2 x 10 reps each. Pt requests to return to bed at end of session. Sit to supine Supervision. Pt left seated in bed with needs in reach, bed alarm in place.  Session 2: Pt received seated in bed, agreeable to PT session. No complaints of pain. Supine to sit with CGA, heavy reliance on bedrail. Sit to stand with CGA to RW throughout session. Ambulatory transfer into bathroom with RW and CGA for  balance. Toilet transfer with CGA and RW and grab bars. Pt is setup A for pericare, assist needed for brief management. Ambulation x 70 ft with RW and min A for balance, increase in L lateral lean and path deviation to the L with onset of fatigue. Pt exhibits ongoing shoulder elevation with gait. Standing mini-squats 2 x 10 reps with RW and CGA for balance. Sidesteps L/R x 10 ft each direction with RW and CGA for balance, cues for upright posture. Pt returned to bed at end of session, Supervision for bed mobility. Pt left seated in bed with needs in reach, bed alarm in place.   Therapy Documentation Precautions:  Precautions Precautions: Fall Precaution Comments: R sided weakness, fall risk Restrictions Weight Bearing Restrictions: No     Therapy/Group: Individual Therapy   Excell Seltzer, PT, DPT, CSRS  01/16/2021, 12:11 PM

## 2021-01-16 NOTE — Progress Notes (Signed)
Speech Language Pathology Daily Session Note  Patient Details  Name: Meagan Peck MRN: [id number] Date of Birth: 1961/01/27  Today's Date: 01/16/2021 SLP Individual Time: 1130-1155 SLP Individual Time Calculation (min): 25 min  Short Term Goals: Week 1: SLP Short Term Goal 1 (Week 1): Patient will demonstrate adequate toleration of regular solids as evidenced by efficient mastication, oral control and swallow initiation with supervision to minA verbal and demonstration cues. SLP Short Term Goal 2 (Week 1): Patient will return-demonstrate to perform speech strategies (increase volume, emphasize/exaggerate articulatory placement, manner and transitions between phonemes) at phrase and sentence level with minA verbal and modeling cues. SLP Short Term Goal 3 (Week 1): Patient will demonstrate adequate coordination of respiratory support with phonation at phrase and sentence level with minA verbal and modeling cues. SLP Short Term Goal 4 (Week 1): Patient will demonstrate 90% intelligibility of speech at 3-4 sentence oral reading level with minA verbal cues for use of strateiges.  Skilled Therapeutic Interventions: Skilled treatment session focused on speech goals. Upon arrival, patient was awake in bed but reported fatigue which impacted her articulatory precision throughout a conversation, however, patient was still overall 100% intelligible. SLP facilitated session by providing Max A verbal and demonstration cues for utilization of a tight lip seal and a large breath in order to improve accuracy with EMST 150 set at 30 cm H2O. Patient was able to perform 3 consecutive repetitions appropriately reaching only 15 total reps with patient reporting intermittent shortness of breath. SLP provided patient with a new mouthpiece that appeared to help facilitate a stronger lip seal. Recommend full supervision with EMST at this time as patient required Max verbal cues to utilize rest breaks even though she  appeared short of breath and was no longer performing the reps accurately. Patient left upright in bed with alarm on and all needs within reach. Continue with current plan of care.   Pain No/Denies Pain   Therapy/Group: Individual Therapy  Meagan Peck 01/16/2021, 12:08 PM

## 2021-01-16 NOTE — Progress Notes (Signed)
Occupational Therapy Session Note  Patient Details  Name: Meagan Peck MRN: [id number] Date of Birth: 1961-04-11  Today's Date: 01/16/2021 OT Individual Time: 2248-2500 OT Individual Time Calculation (min): 54 min    Short Term Goals: Week 1:  OT Short Term Goal 1 (Week 1): Pt will complete BSC/toilet transfer Min A of 1 consistently OT Short Term Goal 2 (Week 1): Pt will perform LB dress at sit <> stand Mod A with AE PRN OT Short Term Goal 3 (Week 1): Pt will perform self feeding with AE PRN with Min A   Skilled Therapeutic Interventions/Progress Updates:    Pt greeted at time of session semireclined in bed resting just finished med pass, no pain. Agreeable to OT session, politely declined bathing and dressing. Discussed technique using block under feet when toileting as pt reported BSC over toilet was too high and placed block in bathroom. Stand pivot bed > wheelchair > mat with HHA/Min throughout session and extended time to manage feet. Seated at sink, oral hygiene and face washing, used electric toothbrush from home with Supervision and extended time. Sit <> stand at high low table CGA and using RUE only for ring arch x3 times to improve GMC/FMC. Side stepping along therapy mat x2 with HHA and extended time to manage feet, prior to completing the following seated: 1x15 for wrist extension gravity eliminated with small weighted ball, translation L<>R hand same ball, and rollouts on table. Back in room, stand pivot to recliner HHA. Alarm on call bell in reach.   Therapy Documentation Precautions:  Precautions Precautions: Fall Precaution Comments: R sided weakness, fall risk Restrictions Weight Bearing Restrictions: No   Therapy/Group: Individual Therapy  Viona Gilmore 01/16/2021, 7:11 AM

## 2021-01-17 LAB — GLUCOSE, CAPILLARY
Glucose-Capillary: 166 mg/dL — ABNORMAL HIGH (ref 70–99)
Glucose-Capillary: 195 mg/dL — ABNORMAL HIGH (ref 70–99)
Glucose-Capillary: 201 mg/dL — ABNORMAL HIGH (ref 70–99)
Glucose-Capillary: 204 mg/dL — ABNORMAL HIGH (ref 70–99)

## 2021-01-17 NOTE — Progress Notes (Signed)
[place] PHYSICAL MEDICINE & REHABILITATION PROGRESS NOTE  Subjective/Complaints: Patient seen sitting up in bed this morning.  She states she did not sleep well overnight because she did not get comfortable.  ROS: Denies CP, SOB, N/V/D  Objective: Vital Signs: Blood pressure (!) 150/52, pulse 79, temperature 98.4 F (36.9 C), temperature source Oral, resp. rate 18, height 4' 10"  (1.473 m), weight 62.2 kg, last menstrual period 12/03/2013, SpO2 98 %. No results found. Recent Labs    01/15/21 0511  WBC 5.2  HGB 10.7*  HCT 29.7*  PLT 94*    No results for input(s): NA, K, CL, CO2, GLUCOSE, BUN, CREATININE, CALCIUM in the last 72 hours.   Intake/Output Summary (Last 24 hours) at 01/17/2021 0919 Last data filed at 01/17/2021 0812 Gross per 24 hour  Intake 360 ml  Output --  Net 360 ml         Physical Exam: BP (!) 150/52 (BP Location: Right Leg)   Pulse 79   Temp 98.4 F (36.9 C) (Oral)   Resp 18   Ht 4' 10"  (1.473 m)   Wt 62.2 kg   LMP 12/03/2013 (LMP Unknown)   SpO2 98%   BMI 28.66 kg/m  Constitutional: No distress . Vital signs reviewed. HENT: Normocephalic.  Atraumatic. Eyes: Disconjugate gaze. No discharge. Cardiovascular: No JVD.  RRR. Respiratory: Normal effort.  No stridor.  Bilateral clear to auscultation. GI: Non-distended.  BS +. Skin: Warm and dry.  Intact. Psych: Normal mood.  Normal behavior. Musc: No edema in extremities.  No tenderness in extremities. Neuro: Alert Dysarthria, stable Motor: RUE: 4 -/5 proximal distal, unchanged RLE: 4/5 proximal distal Sensation diminished to light touch right hand, stable   Assessment/Plan: 1. Functional deficits which require 3+ hours per day of interdisciplinary therapy in a comprehensive inpatient rehab setting. Physiatrist is providing close team supervision and 24 hour management of active medical problems listed below. Physiatrist and rehab team continue to assess barriers to discharge/monitor patient  progress toward functional and medical goals   Care Tool:  Bathing    Body parts bathed by patient: Right arm, Left arm, Chest, Abdomen, Right upper leg, Left upper leg, Face (simulated)   Body parts bathed by helper: Front perineal area, Buttocks, Right lower leg, Left lower leg     Bathing assist Assist Level: Maximal Assistance - Patient 24 - 49%     Upper Body Dressing/Undressing Upper body dressing   What is the patient wearing?: Pull over shirt    Upper body assist Assist Level: Moderate Assistance - Patient 50 - 74%    Lower Body Dressing/Undressing Lower body dressing      What is the patient wearing?: Incontinence brief     Lower body assist Assist for lower body dressing: Maximal Assistance - Patient 25 - 49%     Toileting Toileting    Toileting assist Assist for toileting: Moderate Assistance - Patient 50 - 74%     Transfers Chair/bed transfer  Transfers assist     Chair/bed transfer assist level: Minimal Assistance - Patient > 75%     Locomotion Ambulation   Ambulation assist   Ambulation activity did not occur: Safety/medical concerns (pt became emotional, did not have time for further mobility)  Assist level: Minimal Assistance - Patient > 75% Assistive device: Walker-rolling Max distance: 50'   Walk 10 feet activity   Assist  Walk 10 feet activity did not occur: Safety/medical concerns  Assist level: Minimal Assistance - Patient > 75% Assistive device:  Walker-rolling   Walk 50 feet activity   Assist Walk 50 feet with 2 turns activity did not occur: Safety/medical concerns  Assist level: Minimal Assistance - Patient > 75% Assistive device: Walker-rolling    Walk 150 feet activity   Assist Walk 150 feet activity did not occur: Safety/medical concerns         Walk 10 feet on uneven surface  activity   Assist Walk 10 feet on uneven surfaces activity did not occur: Safety/medical concerns          Wheelchair     Assist Is the patient using a wheelchair?: Yes Type of Wheelchair: Manual Wheelchair activity did not occur: Safety/medical concerns         Wheelchair 50 feet with 2 turns activity    Assist    Wheelchair 50 feet with 2 turns activity did not occur: Safety/medical concerns       Wheelchair 150 feet activity     Assist  Wheelchair 150 feet activity did not occur: Safety/medical concerns        Medical Problem List and Plan: 1.  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  Antithrombotics: -DVT/anticoagulation: SCDs Pharmaceutical: Other (comment)             -antiplatelet therapy: N/A 3. Pain Management: Tylenol as needed for HA 4. Mood: Provide emotional support             -antipsychotic agents: N/A 5. Neuropsych: This patient is?  Fully capable of making decisions on her own behalf. 6. Skin/Wound Care: Routine skin checks 7. Fluids/Electrolytes/Nutrition: Routine in and outs 8.  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  Neurology follow-up conservative care 9.  Uncontrolled diabetes mellitus with hyperglycemia.  Hemoglobin A1c 10.7.   NovoLog 12 units 3 times daily, increase to 14 units on 9/16 Semglee 20 units twice daily.  Diabetic teaching- couldn't afford insulin at home.  Labile, but?  Stabilizing on 9/18 10.  Acute on chronic anemia.  Continue iron supplement.    Hemoglobin 10.7 on 9/16, labs ordered for tomorrow  Continue to monitor 11. Constipation: Improving 12. Hiccups-?  Resolved 13. Thrombocytopenia  Platelets 94 on 9/16, labs ordered for tomorrow  Continue to monitor 14.  Cervical stenosis  MRI showing multiple abnormalities  Appreciate neurosurgery recs, no plans for intervention at present 15.  Transaminitis  LFTs  elevated on 9/14, labs ordered for tomorrow 16.  Hypoalbuminemia  Supplement initiated   LOS: 5 days A FACE TO FACE EVALUATION WAS PERFORMED  Dimitri Dsouza Lorie Phenix 01/17/2021, 9:19 AM

## 2021-01-17 NOTE — Progress Notes (Signed)
Occupational Therapy Session Note  Patient Details  Name: Meagan Peck MRN: [id number] Date of Birth: 08/30/1960  Today's Date: 01/17/2021 OT Individual Time: 5176-1607 OT Individual Time Calculation (min): 35 min  and Today's Date: 01/17/2021 OT Missed Time: 25 Minutes Missed Time Reason: Pain   Short Term Goals: Week 1:  OT Short Term Goal 1 (Week 1): Pt will complete BSC/toilet transfer Min A of 1 consistently OT Short Term Goal 2 (Week 1): Pt will perform LB dress at sit <> stand Mod A with AE PRN OT Short Term Goal 3 (Week 1): Pt will perform self feeding with AE PRN with Min A  Skilled Therapeutic Interventions/Progress Updates:    Pt asleep in bed upon arrival but easily aroused. Pt tearful and reported that her Rt shoulder was "hurting really bad" during the night and when she awoke. Pt requested to use toilet and agreeable to walking into bathroom with RW at min A level. Max A for toileting tasks. Pt amb with RW back to room with increasing forward flexion and increased pain in Rt shoulder. Pt required max A to complete transfer to w/c. Pt declined any bathing tasks or changing clothing. Pt also reported that she felt weaker today. Mod A SPT w/c>bed. Sit>supine and repositioning in bed with supervision. Pt unable to continue 2/2 pain. Pt reports that she notified MD of pain this morning. Pt remained in bed with all needs within reach and bed alarm activated.   Therapy Documentation Precautions:  Precautions Precautions: Fall Precaution Comments: R sided weakness, fall risk Restrictions Weight Bearing Restrictions: No General: General OT Amount of Missed Time: 25 Minutes Vital Signs:  Pain:  10/10 Rt shoulder pain; repositioned and emotional support   Therapy/Group: Individual Therapy  Leroy Libman 01/17/2021, 9:23 AM

## 2021-01-17 NOTE — Plan of Care (Signed)
  Problem: Consults Goal: RH GENERAL PATIENT EDUCATION Description: See Patient Education module for education specifics. Outcome: Progressing Goal: Diabetes Guidelines if Diabetic/Glucose > 140 Description: If diabetic or lab glucose is > 140 mg/dl - Initiate Diabetes/Hyperglycemia Guidelines & Document Interventions  Outcome: Progressing   Problem: RH SKIN INTEGRITY Goal: RH STG MAINTAIN SKIN INTEGRITY WITH ASSISTANCE Description: STG Maintain Skin Integrity With Supervision Assistance. Outcome: Progressing   Problem: RH SAFETY Goal: RH STG DECREASED RISK OF FALL WITH ASSISTANCE Description: STG Decreased Risk of Fall With A Supervision ssistance. Outcome: Progressing   Problem: RH PAIN MANAGEMENT Goal: RH STG PAIN MANAGED AT OR BELOW PT'S PAIN GOAL Description: < 3 on a 0-10 pain scale. Outcome: Progressing   Problem: RH KNOWLEDGE DEFICIT GENERAL Goal: RH STG INCREASE KNOWLEDGE OF SELF CARE AFTER HOSPITALIZATION Description: Patient will demonstrate knowledge of medication management, pain management, diabetes management with educational materials and handouts provided by staff independently at discharge. Outcome: Progressing

## 2021-01-18 LAB — COMPREHENSIVE METABOLIC PANEL
ALT: 39 U/L (ref 0–44)
AST: 56 U/L — ABNORMAL HIGH (ref 15–41)
Albumin: 2.2 g/dL — ABNORMAL LOW (ref 3.5–5.0)
Alkaline Phosphatase: 110 U/L (ref 38–126)
Anion gap: 8 (ref 5–15)
BUN: 14 mg/dL (ref 6–20)
CO2: 23 mmol/L (ref 22–32)
Calcium: 8.9 mg/dL (ref 8.9–10.3)
Chloride: 108 mmol/L (ref 98–111)
Creatinine, Ser: 0.77 mg/dL (ref 0.44–1.00)
GFR, Estimated: >60 mL/min (ref >60)
Glucose, Bld: 120 mg/dL — ABNORMAL HIGH (ref 70–99)
Potassium: 4.1 mmol/L (ref 3.5–5.1)
Sodium: 139 mmol/L (ref 135–145)
Total Bilirubin: 3.7 mg/dL — ABNORMAL HIGH (ref 0.3–1.2)
Total Protein: 4.6 g/dL — ABNORMAL LOW (ref 6.5–8.1)

## 2021-01-18 LAB — CBC WITH DIFFERENTIAL/PLATELET
Abs Immature Granulocytes: 0.02 10*3/uL (ref 0.00–0.07)
Basophils Absolute: 0.1 10*3/uL (ref 0.0–0.1)
Basophils Relative: 1 %
Eosinophils Absolute: 0.2 10*3/uL (ref 0.0–0.5)
Eosinophils Relative: 5 %
HCT: 30.5 % — ABNORMAL LOW (ref 36.0–46.0)
Hemoglobin: 11.1 g/dL — ABNORMAL LOW (ref 12.0–15.0)
Immature Granulocytes: 0 %
Lymphocytes Relative: 27 %
Lymphs Abs: 1.2 10*3/uL (ref 0.7–4.0)
MCH: 34.6 pg — ABNORMAL HIGH (ref 26.0–34.0)
MCHC: 36.4 g/dL — ABNORMAL HIGH (ref 30.0–36.0)
MCV: 95 fL (ref 80.0–100.0)
Monocytes Absolute: 0.5 10*3/uL (ref 0.1–1.0)
Monocytes Relative: 10 %
Neutro Abs: 2.6 10*3/uL (ref 1.7–7.7)
Neutrophils Relative %: 57 %
Platelets: 106 10*3/uL — ABNORMAL LOW (ref 150–400)
RBC: 3.21 MIL/uL — ABNORMAL LOW (ref 3.87–5.11)
RDW: 15.9 % — ABNORMAL HIGH (ref 11.5–15.5)
WBC: 4.6 10*3/uL (ref 4.0–10.5)
nRBC: 0 % (ref 0.0–0.2)

## 2021-01-18 LAB — GLUCOSE, CAPILLARY
Glucose-Capillary: 120 mg/dL — ABNORMAL HIGH (ref 70–99)
Glucose-Capillary: 212 mg/dL — ABNORMAL HIGH (ref 70–99)
Glucose-Capillary: 156 mg/dL — ABNORMAL HIGH (ref 70–99)
Glucose-Capillary: 144 mg/dL — ABNORMAL HIGH (ref 70–99)

## 2021-01-18 NOTE — Progress Notes (Signed)
Occupational Therapy Session Note  Patient Details  Name: Meagan Peck MRN: [id number] Date of Birth: 10/02/1960  Today's Date: 01/18/2021 OT Individual Time: 0700-0810 OT Individual Time Calculation (min): 70 min    Short Term Goals: Week 1:  OT Short Term Goal 1 (Week 1): Pt will complete BSC/toilet transfer Min A of 1 consistently OT Short Term Goal 2 (Week 1): Pt will perform LB dress at sit <> stand Mod A with AE PRN OT Short Term Goal 3 (Week 1): Pt will perform self feeding with AE PRN with Min A  Skilled Therapeutic Interventions/Progress Updates:    Pt resting in bed upon arrival. Pt agreeable to taking a shower this morning. Supine>sit EOB with min A. Pt requires min A for scooting EOB 2/2 pt's short stature and BUE weakness. Sit<>stand and SPT to w/c with min A. Shower transfer with min A. Bathing at shower level with mod A. Pt wearing a night gown and Depends brief. Mod A for night gown and max A for brief. Pt requires more then a reasonable amount of time to complete tasks with slow and deliberate movements. Pt transferred to recliner with CGA/min A. Pt remained in recliner with all needs within reach and seat alarm activated.   Therapy Documentation Precautions:  Precautions Precautions: Fall Precaution Comments: R sided weakness, fall risk Restrictions Weight Bearing Restrictions: No Pain:  Pt states her Rt shoulder is "much better" this morning and denies pain     Therapy/Group: Individual Therapy  Leroy Libman 01/18/2021, 8:13 AM

## 2021-01-18 NOTE — Progress Notes (Signed)
Occupational Therapy Session Note  Patient Details  Name: Meagan Peck MRN: [id number] Date of Birth: 10-31-1960  Today's Date: 01/18/2021 OT Individual Time: 1947-1252 OT Individual Time Calculation (min): 28 min    Short Term Goals: Week 1:  OT Short Term Goal 1 (Week 1): Pt will complete BSC/toilet transfer Min A of 1 consistently OT Short Term Goal 2 (Week 1): Pt will perform LB dress at sit <> stand Mod A with AE PRN OT Short Term Goal 3 (Week 1): Pt will perform self feeding with AE PRN with Min A  Skilled Therapeutic Interventions/Progress Updates:  Pt greeted seated in recliner agreeable to OT intervention. Session focus on various therapeutic activities focused on standing balance, functional transfers and dynamic standing balance. Pt currently requires CGA for sit<>stand from recliner with RW, pt able to take steps forward/back ward away from recliner with CGA. Worked on dynamic standing balance where pt instructed to create lego structure from visual aid provided. Pt able to stand ~ 76mns with LUE supported and RUE completed dynamic reach and FBoy[facility] Surgery Centertask. Pt with better FPerkinsusing BUEs with LUE acting as stabilizer. Pt reports fatigue after 4 mins of standing needing to complete task in sitting. pt left seated in recliner with alarm belt activated and all needs within reach.                       Therapy Documentation Precautions:  Precautions Precautions: Fall Precaution Comments: R sided weakness, fall risk Restrictions Weight Bearing Restrictions: No  Pain: pt reports no pain during session     Therapy/Group: Individual Therapy  MCorinne PortsKScl Health [facility]9/19/2022, 12:00 PM

## 2021-01-18 NOTE — Progress Notes (Signed)
[place] PHYSICAL MEDICINE & REHABILITATION PROGRESS NOTE  Subjective/Complaints:   ROS: Denies CP, SOB, N/V/D  Objective: Vital Signs: Blood pressure 131/60, pulse 69, temperature 98.3 F (36.8 C), temperature source Oral, resp. rate 18, height 4' 10"  (1.473 m), weight 62.2 kg, last menstrual period 12/03/2013, SpO2 99 %. No results found. No results for input(s): WBC, HGB, HCT, PLT in the last 72 hours.  Recent Labs    01/18/21 0659  NA 139  K 4.1  CL 108  CO2 23  GLUCOSE 120*  BUN 14  CREATININE 0.77  CALCIUM 8.9     Intake/Output Summary (Last 24 hours) at 01/18/2021 0941 Last data filed at 01/18/2021 0818 Gross per 24 hour  Intake 240 ml  Output --  Net 240 ml         Physical Exam: BP 131/60 (BP Location: Left Arm)   Pulse 69   Temp 98.3 F (36.8 C) (Oral)   Resp 18   Ht 4' 10"  (1.473 m)   Wt 62.2 kg   LMP 12/03/2013 (LMP Unknown)   SpO2 99%   BMI 28.66 kg/m   General: No acute distress Mood and affect are appropriate Heart: Regular rate and rhythm no rubs murmurs or extra sounds Lungs: Clear to auscultation, breathing unlabored, no rales or wheezes Abdomen: Positive bowel sounds, soft nontender to palpation, nondistended Extremities: No clubbing, cyanosis, or edema Skin: No evidence of breakdown, no evidence of rash MSK- tender over the ant chest and abd bilaterally to LT  Dysarthria, stable Motor: RUE: 4 -/5 proximal distal, unchanged RLE: 4/5 proximal distal Sensation diminished to light touch right hand, stable  Right CN 6 palsy  Neg asterixis Cerebellar without dysmetria Assessment/Plan: 1. Functional deficits which require 3+ hours per day of interdisciplinary therapy in a comprehensive inpatient rehab setting. Physiatrist is providing close team supervision and 24 hour management of active medical problems listed below. Physiatrist and rehab team continue to assess barriers to discharge/monitor patient progress toward functional and  medical goals   Care Tool:  Bathing    Body parts bathed by patient: Right arm, Left arm, Chest, Abdomen, Right upper leg, Left upper leg, Face   Body parts bathed by helper: Front perineal area, Buttocks, Right lower leg, Left lower leg     Bathing assist Assist Level: Moderate Assistance - Patient 50 - 74%     Upper Body Dressing/Undressing Upper body dressing   What is the patient wearing?: Pull over shirt    Upper body assist Assist Level: Moderate Assistance - Patient 50 - 74%    Lower Body Dressing/Undressing Lower body dressing      What is the patient wearing?: Incontinence brief     Lower body assist Assist for lower body dressing: Maximal Assistance - Patient 25 - 49%     Toileting Toileting    Toileting assist Assist for toileting: Maximal Assistance - Patient 25 - 49%     Transfers Chair/bed transfer  Transfers assist     Chair/bed transfer assist level: Minimal Assistance - Patient > 75%     Locomotion Ambulation   Ambulation assist   Ambulation activity did not occur: Safety/medical concerns (pt became emotional, did not have time for further mobility)  Assist level: Minimal Assistance - Patient > 75% Assistive device: Walker-rolling Max distance: 50'   Walk 10 feet activity   Assist  Walk 10 feet activity did not occur: Safety/medical concerns  Assist level: Minimal Assistance - Patient > 75% Assistive device: Walker-rolling  Walk 50 feet activity   Assist Walk 50 feet with 2 turns activity did not occur: Safety/medical concerns  Assist level: Minimal Assistance - Patient > 75% Assistive device: Walker-rolling    Walk 150 feet activity   Assist Walk 150 feet activity did not occur: Safety/medical concerns         Walk 10 feet on uneven surface  activity   Assist Walk 10 feet on uneven surfaces activity did not occur: Safety/medical concerns         Wheelchair     Assist Is the patient using a  wheelchair?: Yes Type of Wheelchair: Manual Wheelchair activity did not occur: Safety/medical concerns         Wheelchair 50 feet with 2 turns activity    Assist    Wheelchair 50 feet with 2 turns activity did not occur: Safety/medical concerns       Wheelchair 150 feet activity     Assist  Wheelchair 150 feet activity did not occur: Safety/medical concerns        Medical Problem List and Plan: 1.  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  Antithrombotics: -DVT/anticoagulation: SCDs Pharmaceutical: Other (comment)             -antiplatelet therapy: N/A 3. Pain Management: Tylenol as needed for HA, neurogenic pain over chest and abd bilateral , has some signal abnormality in thalamus bilaterally  4. Mood: Provide emotional support             -antipsychotic agents: N/A 5. Neuropsych: This patient is?  Fully capable of making decisions on her own behalf. 6. Skin/Wound Care: Routine skin checks 7. Fluids/Electrolytes/Nutrition: Routine in and outs 8.  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  Neurology follow-up conservative care 9.  Uncontrolled diabetes mellitus with hyperglycemia.  Hemoglobin A1c 10.7.   NovoLog 12 units 3 times daily, increase to 14 units on 9/16 Semglee 20 units twice daily.  Diabetic teaching- couldn't afford insulin at home.  CBG (last 3)  Recent Labs    01/17/21 1631 01/17/21 2020 01/18/21 0620  GLUCAP 201* 204* 120*  Am CBG normal monitor daytime CBGs prior to novalog change   10.  Acute on chronic anemia.  Continue iron supplement.    Hemoglobin 10.7 on 9/16, labs ordered for tomorrow  Continue to monitor 11. Constipation: Improving- 2 BMs yesterday, cont chronulac 66m TID 12. Hiccups-?  Resolved 13.  Thrombocytopenia  Platelets 94 on 9/16, labs ordered for tomorrow  Continue to monitor 14.  Cervical stenosis  MRI showing multiple abnormalities  Appreciate neurosurgery recs, no plans for intervention at present 15.  Transaminitis  Only mild elevation of AST, improving  16.  Hypoalbuminemia  Supplement initiated   LOS: 6 days A FACE TO FACE EVALUATION WAS PERFORMED  ACharlett Blake9/19/2022, 9:41 AM

## 2021-01-18 NOTE — Progress Notes (Signed)
Speech Language Pathology Daily Session Note  Patient Details  Name: Meagan Peck MRN: [id number] Date of Birth: [birth year]  Today's Date: 01/18/2021 SLP Individual Time: 1100-1158 SLP Individual Time Calculation (min): 58 min  Short Term Goals: Week 1: SLP Short Term Goal 1 (Week 1): Patient will demonstrate adequate toleration of regular solids as evidenced by efficient mastication, oral control and swallow initiation with supervision to minA verbal and demonstration cues. SLP Short Term Goal 2 (Week 1): Patient will return-demonstrate to perform speech strategies (increase volume, emphasize/exaggerate articulatory placement, manner and transitions between phonemes) at phrase and sentence level with minA verbal and modeling cues. SLP Short Term Goal 3 (Week 1): Patient will demonstrate adequate coordination of respiratory support with phonation at phrase and sentence level with minA verbal and modeling cues. SLP Short Term Goal 4 (Week 1): Patient will demonstrate 90% intelligibility of speech at 3-4 sentence oral reading level with minA verbal cues for use of strateiges.  Skilled Therapeutic Interventions:Skilled ST services focused on speech skills. SLP facilitated return demonstration of EMST set at 30cm H20, however only able to complete x1 with max A multimodal cues. SLP provided lower level EMST pt was able to complete set at 20cm H20 x2 and then set at 18cm H20 completed x10 with max A cues for appropriate lip seal and x1 breath in between each repetition. Pt was unable to provide a number on self-rating scale, but supported "it is hard on the exhale" referring to keeping device in her oral cavity. SLP provided education to create tight labial seal first then exhale. SLP also facilitated precise articulation with each consonant at the sentence level, articulation was slightly reduced on initial /s/ and /sh/ sounds, however the greatest reduction in speech intelligibility appeared to be due  to reduce breath support. SLP provided education to take a breath every 2-3 words, which increase pt's vocal intensity and clarity from 80% to 90% at the sentence level with mod A fade to min A verbal cues. Pt completed x10 repetitions for EMST set at 18cm H20 with mod A verbal cues for accuracy. SLP posted speech intelligibly sign in room. Pt was left in room with call bell within reach and chair alarm set. SLP recommends to continue skilled services.      Pain Pain Assessment Pain Scale: 0-10 Pain Score: 0-No pain  Therapy/Group: Individual Therapy  [name]  [facility] 01/18/2021, 12:01 PM

## 2021-01-18 NOTE — Progress Notes (Signed)
Physical Therapy Session Note  Patient Details  Name: Meagan Peck MRN: [id number] Date of Birth: 03/15/1961  Today's Date: 01/18/2021 PT Individual Time: 1000-1030; 1300-1330 PT Individual Time Calculation (min): 30 min and 30 min PT Missed Time: 15 min Missed Time Reason: patient fatigue  Short Term Goals: Week 1:  PT Short Term Goal 1 (Week 1): Pt will initiate gait training PT Short Term Goal 2 (Week 1): Pt will transfer with min A or better consistently PT Short Term Goal 3 (Week 1): Pt will maintain sitting balance without UE suppoty x 2 min  Skilled Therapeutic Interventions/Progress Updates:    Session 1: Pt received seated in recliner in room, agreeable to PT session. No complaints of pain. Sit to stand with CGA to RW. Ambulation x 50 ft with RW and CGA for balance, cues for upright posture due to onset of trunk flexion. Pt reports feeling less steady on her feet this date, requires increased time to complete ambulation distance. Pt reports urge to urinate. Toilet transfer with CGA and RW. Pt requires assist for brief management for time conservation and due to urgency, setup A for pericare. Pt left seated in recliner in room with needs in reach, chair alarm in place at end of session.  Session 2: Pt received seated in recliner in room, reports feeling very fatigued this PM but agreeable to participate in therapy session as able. No complaints of pain at rest, does have onset of R bicep pain with standing activity due to use of RUE for support on RW and/or stair rail. Pt reports slight improvement in pain at rest, nursing notified and to check on pain medication for patient. Stand pivot transfer recliner to w/c with RW and CGA for balance, significantly increased amount of time required to complete transfer. Standing alt L/R 3" step taps with B handrails and min A for balance, x 10 reps each before onset of fatigue. Pt unable to further functionally participate in session due to  fatigue, returned to patient room. Stand pivot transfer w/c to bed with RW and CGA. Sit to supine Supervision. Pt left seated in bed with needs in reach, bed alarm in place. Pt missed 15 min of scheduled skilled therapy session due to significant fatigue this PM.  Therapy Documentation Precautions:  Precautions Precautions: Fall Precaution Comments: R sided weakness, fall risk Restrictions Weight Bearing Restrictions: No    Therapy/Group: Individual Therapy   Excell Seltzer, PT, DPT, CSRS  01/18/2021, 12:09 PM

## 2021-01-19 DIAGNOSIS — M6289 Other specified disorders of muscle: Secondary | ICD-10-CM

## 2021-01-19 LAB — GLUCOSE, CAPILLARY
Glucose-Capillary: 120 mg/dL — ABNORMAL HIGH (ref 70–99)
Glucose-Capillary: 189 mg/dL — ABNORMAL HIGH (ref 70–99)
Glucose-Capillary: 161 mg/dL — ABNORMAL HIGH (ref 70–99)
Glucose-Capillary: 106 mg/dL — ABNORMAL HIGH (ref 70–99)

## 2021-01-19 MED ORDER — BACLOFEN 5 MG HALF TABLET
5.0000 mg | ORAL_TABLET | Freq: Two times a day (BID) | ORAL | Status: DC
  Administered 2021-01-19 – 2021-01-20 (×3): 5 mg via ORAL
  Filled 2021-01-19 (×3): qty 1

## 2021-01-19 NOTE — Progress Notes (Signed)
Nutrition Follow-up  DOCUMENTATION CODES:   Not applicable  INTERVENTION:  Continue 30 ml Prosource plus po BID, each supplement provides 100 kcal and 15 grams of protein.   Encourage adequate PO intake.   NUTRITION DIAGNOSIS:   Increased nutrient needs related to chronic illness (NASH cirrhosis) as evidenced by estimated needs; ongoing  GOAL:   Patient will meet greater than or equal to 90% of their needs; met  MONITOR:   PO intake, Supplement acceptance, Skin, Weight trends, Labs, I & O's  REASON FOR ASSESSMENT:   Malnutrition Screening Tool    ASSESSMENT:   60 year old right-handed female with history significant of [name] cirrhosis with previous hepatic encephalopathy maintained on chronic Cipro, diabetes mellitus hypertension hyperlipidemia, esophageal varices/GERD. Presented 01/08/2021 altered mental status and slurred speech. MRI showed striking abnormal diffusion, T2 and flair signal abnormality symmetrically affecting the pons with sparing of the bilateral cortical infarcts favoring osmotic demyelination syndrome. 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  Meal completion has been 100%. Pt has been tolerating her PO diet. Pt currently has Prosource plus ordered and has been consuming them. RD to continue with current orders to aid in adequate protein needs. Labs and medications reviewed.   Diet Order:   Diet Order             Diet Carb Modified Fluid consistency: Thin; Room service appropriate? Yes  Diet effective now                   EDUCATION NEEDS:   Not appropriate for education at this time  Skin:  Skin Assessment: Reviewed RN Assessment  Last BM:  9/19  Height:   Ht Readings from Last 1 Encounters:  01/12/21 4' 10"  (1.473 m)    Weight:   Wt Readings from Last 1 Encounters:  01/12/21 62.2 kg   BMI:  Body mass index is 28.66  kg/m.  Estimated Nutritional Needs:   Kcal:  1700-1900  Protein:  80-95 grams  Fluid:  >/= 1.7 L/day  [name], MS, RD, LDN RD pager number/after hours weekend pager number on Amion.

## 2021-01-19 NOTE — Progress Notes (Signed)
Speech Language Pathology Daily Session Note  Patient Details  Name: Meagan Peck MRN: [id number] Date of Birth: 11-Nov-1960  Today's Date: 01/19/2021 SLP Individual Time: 1230-1257 SLP Individual Time Calculation (min): 27 min  Short Term Goals: Week 1: SLP Short Term Goal 1 (Week 1): Patient will demonstrate adequate toleration of regular solids as evidenced by efficient mastication, oral control and swallow initiation with supervision to minA verbal and demonstration cues. SLP Short Term Goal 2 (Week 1): Patient will return-demonstrate to perform speech strategies (increase volume, emphasize/exaggerate articulatory placement, manner and transitions between phonemes) at phrase and sentence level with minA verbal and modeling cues. SLP Short Term Goal 3 (Week 1): Patient will demonstrate adequate coordination of respiratory support with phonation at phrase and sentence level with minA verbal and modeling cues. SLP Short Term Goal 4 (Week 1): Patient will demonstrate 90% intelligibility of speech at 3-4 sentence oral reading level with minA verbal cues for use of strateiges.  Skilled Therapeutic Interventions:Skilled ST services focused on swallow skills. PT and OT reported increase weakness in pt's function today prior to ST treatment session. Pt was unable to self-feed via spoon or cup regular textures and thin liquids via straw with lunch tray. SLP provided total A feeding soup with spoon and cup via straw. Pt was able to self-feed chips with mod A picking up chips from tray. Pt demonstrated x1 overt s/s aspiration with thin liquids and x1 regular textures, appeared due to fatigue and reduced attention. SLP provided education for swallow strategies and informated NT of need for feeding assistance for dinner today with noted weakness. Pt was able to recall speech intelligibiity strategies with supervision A verbal cues, however intelligibility was at 50% during simple conversation during meals,  increasing to 65% intelligibility at the completion of the meal focusing only on speech skills. Pt was left in room with call bell within reach and chair alarm set. SLP recommends to continue skilled services.     Pain Pain Assessment Pain Score: 0-No pain  Therapy/Group: Individual Therapy  Candiace West  [facility] 01/19/2021, 1:03 PM

## 2021-01-19 NOTE — Progress Notes (Signed)
Physical Therapy Session Note  Patient Details  Name: Meagan Peck MRN: [id number] Date of Birth: [birth year]  Today's Date: 01/19/2021 PT Individual Time: 0915-1000; 1600-1630 PT Individual Time Calculation (min): 45 min and 30 min PT Missed Time: 30 min Missed Time Reason: patient fatigue  Short Term Goals: Week 1:  PT Short Term Goal 1 (Week 1): Pt will initiate gait training PT Short Term Goal 2 (Week 1): Pt will transfer with min A or better consistently PT Short Term Goal 3 (Week 1): Pt will maintain sitting balance without UE suppoty x 2 min  Skilled Therapeutic Interventions/Progress Updates:    Session 1: Pt received seated in recliner in room, agreeable to PT session. Pt reports feeling discouraged this AM due to increase in overall weakness and increased difficulty with mobility. Pt also reports pain/soreness in her R UE/LE, not rated and declines intervention. Pt reports she spoke with the MD this morning about her increased weakness and her pain. Sit to stand x 3 reps to RW with CGA. Initially pt exhibits good upright trunk in standing, does have posterior lean and needs cues for anterior weight shift. With progression in repetitions of standing pt exhibits increase in trunk flexion and inability to correct even with cueing and standing with RW. Pt returned to sitting in recliner. Pt has onset of trunk flexion while seated in recliner, needs min A to maintain sitting balance. Assisted pt with scooting back in recliner chair and provided pillows under UE for increased support in sitting position. Pt also reports difficulty with pressing buttons on call button, provided soft-touch call button. Pt left seated in recliner in room with needs in reach, chair alarm in place.  Session 2: Pt received seated in recliner in room, agreeable to PT session. No complaints of pain. Pt reports ongoing lethargy this PM but agreeable to participate as able. Sit to stand to RW with min A. Pt unable to  maintain upright standing posture and remains with flexed trunk and hyperextended knees bilaterally, returned to sitting. Obtained stedy. Sit to stand x 3 reps to stedy with CGA. Pt able to improve her trunk control in standing with use of stedy but does exhibit onset of trunk flexion with fatigue after about 1 min. Pt exhibits decreased tolerance for standing with each repetition due to fatigue. Stedy transfer back to bed. Sit to supine mod A for BLE management due to increase in fatigue this PM. Pt left supine in bed with needs in reach, bed alarm in place. Pt missed 30 min of scheduled therapy session due to significant fatigue this PM.  Therapy Documentation Precautions:  Precautions Precautions: Fall Precaution Comments: R sided weakness, fall risk Restrictions Weight Bearing Restrictions: No    Therapy/Group: Individual Therapy   Excell Seltzer, PT, DPT, CSRS  01/19/2021, 12:10 PM

## 2021-01-19 NOTE — Patient Care Conference (Signed)
Inpatient RehabilitationTeam Conference and Plan of Care Update Date: 01/19/2021   Time: 11:15 AM    Patient Name: Meagan Peck      Medical Record Number: [id number]  Date of Birth: 1960/10/15 Sex: Female         Room/Bed: 4W21C/4W21C-01 Payor Info: Payor: /    Admit Date/Time:  01/12/2021 12:10 PM  Primary Diagnosis:  Demyelinating disease of central nervous system, unspecified [facility])  Hospital Problems: Principal Problem:   Demyelinating disease of central nervous system, unspecified ([place]) Active Problems:   Hepatic encephalopathy ([place])   Acute on chronic anemia   Uncontrolled type 2 diabetes mellitus with hyperglycemia ([place])   Spinal stenosis in cervical region   Transaminitis   Hypoalbuminemia due to protein-calorie malnutrition [facility])   Muscle tightness    Expected Discharge Date: Expected Discharge Date: 02/02/21  Team Members Present: Physician leading conference: Dr. Delice Lesch Social Worker Present: [name], [place] Nurse Present: Dorthula Nettles, RN PT Present: Excell Seltzer, PT OT Present: Roanna Epley, [place], OT SLP Present: Charolett Bumpers, SLP [place] Coordinator present : Gunnar Fusi, SLP     Current Status/Progress Goal Weekly Team Focus  Bowel/Bladder   continent/incontinent bladder at times. continent bowel, lbm 9/18  regain continence  time toilet q 2hr   Swallow/Nutrition/ Hydration   regular textures and thin liquids, supervision A, intermittement supervision A  Mod I  tolerance of current diet   ADL's   bathing at shower level-min A: LB dressing-max A; functional transfers-CGA; toileting-max A  supervision overall; min A LB dressing  activity tolerance, BADL retraining, safety awareness, education   Mobility   Supervision bed mobility with bedrail, CGA transfers and gait up to 50 ft with RW, 3" step min A with 2 handrails  Supervision overall  endurance, balance, gait, stairs, transfers   Communication   mod-min A, breath  support  Mod I  EMST, speech intelligibility strategies   Safety/Cognition/ Behavioral Observations            Pain   8/10 reported, Tylenol available  <3  assess pain q 4hr and prn   Skin   CDI  no new breakdown  assess skin q shift and prn     Discharge Planning:  Pt to d/c to home with her husband who has own health issues and can only provide supervision. Pt dtr [name] will provide physical assistance and states she will flex her schedule to make sure she can provide care to her.   Team Discussion: Complains of right arm pain, adjusted muscle relaxer. Continent B/B with episodes of incontinent bladder, LBM 9/18. Tylenol available for reported 8/10 pain. Skin in CDI. Patient manages diabetes and insulin injections at home. Nursing educating on diabetes, medication, and pain management. Current barrier is pain. Discharging home with husband and daughter. Mobility a struggle today, only stood 3 times today. More weakness today, soft touch call bell added. Intermittent supervision with meals, RMT started, and working on breath support. Pain, mobility, and fatigue are barriers. Patient on target to meet rehab goals: yes  *See Care Plan and progress notes for long and short-term goals.   Revisions to Treatment Plan:  MD adjusted muscle relaxer.  Teaching Needs: Family education, medication management, pain management, bladder management, diabetes education, transfer training, gait training, balance training, endurance training, safety awareness.  Current Barriers to Discharge: Decreased caregiver support, Medical stability, Home enviroment access/layout, Incontinence, Lack of/limited family support, Weight bearing restrictions, and Medication compliance  Possible Resolutions to Barriers: Continue  current medications for pain management, implement time toileting schedule, continue to work on mobility, and fatigue management.     Medical Summary Current Status: Debility due to osmotic  demyelination sydnrome resulting in altered mental status secondary to hepatic encephalopathy/NASH cirrhosis and quadriparesis Continue scheduled Chronulac.PATIENT IS MAINTAINED ON CHRONIC CIPRO  Barriers to Discharge: Medical stability   Possible Resolutions to Barriers/Weekly Focus: Therapies, optimize CM meds, follow labs - Hb, plts, LFTs, optimize pain meds   Continued Need for Acute Rehabilitation Level of Care: The patient requires daily medical management by a physician with specialized training in physical medicine and rehabilitation for the following reasons: Direction of a multidisciplinary physical rehabilitation program to maximize functional independence : Yes Medical management of patient stability for increased activity during participation in an intensive rehabilitation regime.: Yes Analysis of laboratory values and/or radiology reports with any subsequent need for medication adjustment and/or medical intervention. : Yes   I attest that I was present, lead the team conference, and concur with the assessment and plan of the team.   Dorthula Nettles G 01/19/2021, 4:20 PM

## 2021-01-19 NOTE — Progress Notes (Deleted)
PHYSICAL MEDICINE & REHABILITATION PROGRESS NOTE  Subjective/Complaints:  Patient seen sitting up in her chair this morning.  She states she did not sleep well overnight due to right arm pain-tightness.  ROS: Denies CP, SOB, N/V/D  Objective: Vital Signs: Blood pressure (!) 132/58, pulse 72, temperature 98.7 F (37.1 C), temperature source Oral, resp. rate 16, height 4' 10"  (1.473 m), weight 62.2 kg, last menstrual period 12/03/2013, SpO2 100 %. No results found. Recent Labs    01/18/21 1133  WBC 4.6  HGB 11.1*  HCT 30.5*  PLT 106*    Recent Labs    01/18/21 0659  NA 139  K 4.1  CL 108  CO2 23  GLUCOSE 120*  BUN 14  CREATININE 0.77  CALCIUM 8.9     Intake/Output Summary (Last 24 hours) at 01/19/2021 1113 Last data filed at 01/19/2021 0859 Gross per 24 hour  Intake 360 ml  Output --  Net 360 ml         Physical Exam: BP (!) 132/58 (BP Location: Left Arm)   Pulse 72   Temp 98.7 F (37.1 C) (Oral)   Resp 16   Ht 4' 10"  (1.473 m)   Wt 62.2 kg   LMP 12/03/2013 (LMP Unknown)   SpO2 100%   BMI 28.66 kg/m  Constitutional: No distress . Vital signs reviewed. HENT: Normocephalic.  Atraumatic. Eyes: EOMI. No discharge. Cardiovascular: No JVD.  RRR. Respiratory: Normal effort.  No stridor.  Bilateral clear to auscultation. GI: Non-distended.  BS +. Skin: Warm and dry.  Intact. Psych: Normal mood.  Normal behavior. Musc: No edema in extremities.  Tenderness in right upper extremity Neuro: Alert Dysarthria, unchanged Motor: RUE: 4 -/5 proximal distal, unchanged RLE: 4/5 proximal distal Sensation diminished to light touch right hand, stable Right CN 6 palsy   Assessment/Plan: 1. Functional deficits which require 3+ hours per day of interdisciplinary therapy in a comprehensive inpatient rehab setting. Physiatrist is providing close team supervision and 24 hour management of active medical problems listed below. Physiatrist and rehab team continue  to assess barriers to discharge/monitor patient progress toward functional and medical goals   Care Tool:  Bathing    Body parts bathed by patient: Right arm, Left arm, Chest, Abdomen, Right upper leg, Left upper leg, Face   Body parts bathed by helper: Front perineal area, Buttocks, Right lower leg, Left lower leg     Bathing assist Assist Level: Moderate Assistance - Patient 50 - 74%     Upper Body Dressing/Undressing Upper body dressing   What is the patient wearing?: Pull over shirt    Upper body assist Assist Level: Moderate Assistance - Patient 50 - 74%    Lower Body Dressing/Undressing Lower body dressing      What is the patient wearing?: Incontinence brief     Lower body assist Assist for lower body dressing: Maximal Assistance - Patient 25 - 49%     Toileting Toileting    Toileting assist Assist for toileting: Maximal Assistance - Patient 25 - 49%     Transfers Chair/bed transfer  Transfers assist     Chair/bed transfer assist level: Minimal Assistance - Patient > 75%     Locomotion Ambulation   Ambulation assist   Ambulation activity did not occur: Safety/medical concerns (pt became emotional, did not have time for further mobility)  Assist level: Minimal Assistance - Patient > 75% Assistive device: Walker-rolling Max distance: 50'   Walk 10 feet activity   Assist  Walk 10 feet activity did not occur: Safety/medical concerns  Assist level: Minimal Assistance - Patient > 75% Assistive device: Walker-rolling   Walk 50 feet activity   Assist Walk 50 feet with 2 turns activity did not occur: Safety/medical concerns  Assist level: Minimal Assistance - Patient > 75% Assistive device: Walker-rolling    Walk 150 feet activity   Assist Walk 150 feet activity did not occur: Safety/medical concerns         Walk 10 feet on uneven surface  activity   Assist Walk 10 feet on uneven surfaces activity did not occur: Safety/medical  concerns         Wheelchair     Assist Is the patient using a wheelchair?: Yes Type of Wheelchair: Manual Wheelchair activity did not occur: Safety/medical concerns         Wheelchair 50 feet with 2 turns activity    Assist    Wheelchair 50 feet with 2 turns activity did not occur: Safety/medical concerns       Wheelchair 150 feet activity     Assist  Wheelchair 150 feet activity did not occur: Safety/medical concerns        Medical Problem List and Plan: 1.  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 Please see conference note from today as well.  2.  Antithrombotics: -DVT/anticoagulation: SCDs Pharmaceutical: Other (comment)             -antiplatelet therapy: N/A 3. Pain Management: Tylenol as needed for HA, neurogenic pain over chest and abd bilateral , has some signal abnormality in thalamus bilaterally   Baclofen 5 BID started on 9/20 4. Mood: Provide emotional support             -antipsychotic agents: N/A 5. Neuropsych: This patient is?  Fully capable of making decisions on her own behalf. 6. Skin/Wound Care: Routine skin checks 7. Fluids/Electrolytes/Nutrition: Routine in and outs 8.  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  Neurology follow-up conservative care 9.  Uncontrolled diabetes mellitus with hyperglycemia.  Hemoglobin A1c 10.7.   NovoLog 12 units 3 times daily, increase to 14 units on 9/16 Semglee 20 units twice daily.  Diabetic teaching- couldn't afford insulin at home.  CBG (last 3)  Recent Labs    01/18/21 1638 01/18/21 2118 01/19/21 0624  GLUCAP 156* 144*  120*   Improving control on 9/20 10.  Acute on chronic anemia.  Continue iron supplement.    Hemoglobin 11.1 on 9/19  Continue to monitor 11. Constipation: Cont chronulac 81m TID Patient states lactulose not working, however having bowel movements 12. Hiccups-?  Resolved 13. Thrombocytopenia  Platelets 106 on 9/19  Continue to monitor 14.  Cervical stenosis  MRI showing multiple abnormalities  Appreciate neurosurgery recs, no plans for intervention at present 15.  Transaminitis  Only mild elevation of AST, improving  16.  Hypoalbuminemia  Supplement initiated   LOS: 7 days A FACE TO FACE EVALUATION WAS PERFORMED  Jemal Miskell ALorie Phenix9/20/2022, 11:13 AM

## 2021-01-19 NOTE — Progress Notes (Signed)
Patient ID: Meagan Peck, female   DOB: [birth year], 60 y.o.   MRN: [id number]  SW met with pt in room and called pt dtr [name] to provide updates from team conference, and d/c date 10/4. Fam edu scheduled for 9/30 1pm-3pm with her dtr. [name] aware there will continue to be updates after team conference.   [name], MSW, [place] Office: [phone] Cell: [phone] Fax: [phone]

## 2021-01-19 NOTE — Progress Notes (Signed)
Occupational Therapy Session Note  Patient Details  Name: Meagan Peck MRN: [id number] Date of Birth: [birth year]  Today's Date: 01/19/2021 OT Individual Time: 0700-0755 OT Individual Time Calculation (min): 55 min    Short Term Goals: Week 2:  OT Short Term Goal 1 (Week 2): Pt will perform LB dress at sit <> stand Mod A with AE PRN OT Short Term Goal 2 (Week 2): Pt will perform toileting tasks with mod A OT Short Term Goal 3 (Week 2): Pt will complete bathing tasks with min A at shower level  Skilled Therapeutic Interventions/Progress Updates:    Pt resting in bed upon arrival. Pt tearful and thinks she is "getting weaker." Emotional support provided. Pt requested to use bathroom. Supine>sit EOB with HOB elevated-min A. Sit<>stand and SPT to w/c with min A and extra time.SPT to toilet with min A and extra time. Toileting with max A. Pt declined washing face or brushing teeth this morning. Pt with difficulty maintaining sitting balance without BLE support. Pt requested to sit in relciner. SPT with RW to recliner with min A. Pt requires mod A to scoot back into recliner. Pt issued foam blocks for grasp strengthening. Pt tearful throughout session. Discussed with PT use of pediatric w/c if available. Pt remained in recliner with all needs within reach and seat alarm activated.   Therapy Documentation Precautions:  Precautions Precautions: Fall Precaution Comments: R sided weakness, fall risk Restrictions Weight Bearing Restrictions: No Pain:  Pt denies pain this morning   Therapy/Group: Individual Therapy  Leroy Libman 01/19/2021, 7:59 AM

## 2021-01-19 NOTE — Progress Notes (Signed)
Occupational Therapy Weekly Progress Note  Patient Details  Name: Meagan Peck MRN: [id number] Date of Birth: 1960-11-19  Beginning of progress report period: January 13, 2021 End of progress report period: January 19, 2021  Patient has met 2 of 3 short term goals.  Pt is making slow but steady progress with BADLs and functional transfers. Pt requires more then a reasonable amount of time to complete all tasks. Pt completes bathing tasks with mod A at shower level and LB dressing tasks with max A. Pt requires max A for toileting tasks. All functional transfers with min A using RW. BUE at diminished level and requires min A for self feeding and grooming tasks at sink.   Patient continues to demonstrate the following deficits: muscle weakness, decreased cardiorespiratoy endurance, impaired timing and sequencing, unbalanced muscle activation, and decreased coordination, and decreased sitting balance, decreased standing balance, decreased postural control, and decreased balance strategies and therefore will continue to benefit from skilled OT intervention to enhance overall performance with BADL and Reduce care partner burden.  Patient progressing toward long term goals..  Continue plan of care.  OT Short Term Goals Week 1:  OT Short Term Goal 1 (Week 1): Pt will complete BSC/toilet transfer Min A of 1 consistently OT Short Term Goal 1 - Progress (Week 1): Met OT Short Term Goal 2 (Week 1): Pt will perform LB dress at sit <> stand Mod A with AE PRN OT Short Term Goal 2 - Progress (Week 1): Progressing toward goal OT Short Term Goal 3 (Week 1): Pt will perform self feeding with AE PRN with Min A OT Short Term Goal 3 - Progress (Week 1): Met Week 2:  OT Short Term Goal 1 (Week 2): Pt will perform LB dress at sit <> stand Mod A with AE PRN OT Short Term Goal 2 (Week 2): Pt will perform toileting tasks with mod A OT Short Term Goal 3 (Week 2): Pt will complete bathing tasks with min A at shower  level   Leroy Libman 01/19/2021, 6:51 AM

## 2021-01-19 NOTE — Progress Notes (Addendum)
[place] PHYSICAL MEDICINE & REHABILITATION PROGRESS NOTE  Subjective/Complaints:  Patient seen sitting up in her chair this morning.  She states she did not sleep well overnight due to right arm pain-tightness.  ROS: Denies CP, SOB, N/V/D  Objective: Vital Signs: Blood pressure (!) 132/58, pulse 72, temperature 98.7 F (37.1 C), temperature source Oral, resp. rate 16, height 4' 10"  (1.473 m), weight 62.2 kg, last menstrual period 12/03/2013, SpO2 100 %. No results found. Recent Labs    01/18/21 1133  WBC 4.6  HGB 11.1*  HCT 30.5*  PLT 106*    Recent Labs    01/18/21 0659  NA 139  K 4.1  CL 108  CO2 23  GLUCOSE 120*  BUN 14  CREATININE 0.77  CALCIUM 8.9     Intake/Output Summary (Last 24 hours) at 01/19/2021 1029 Last data filed at 01/18/2021 1830 Gross per 24 hour  Intake 240 ml  Output --  Net 240 ml         Physical Exam: BP (!) 132/58 (BP Location: Left Arm)   Pulse 72   Temp 98.7 F (37.1 C) (Oral)   Resp 16   Ht 4' 10"  (1.473 m)   Wt 62.2 kg   LMP 12/03/2013 (LMP Unknown)   SpO2 100%   BMI 28.66 kg/m  Constitutional: No distress . Vital signs reviewed. HENT: Normocephalic.  Atraumatic. Eyes: EOMI. No discharge. Cardiovascular: No JVD.  RRR. Respiratory: Normal effort.  No stridor.  Bilateral clear to auscultation. GI: Non-distended.  BS +. Skin: Warm and dry.  Intact. Psych: Normal mood.  Normal behavior. Musc: No edema in extremities.  Tenderness in right upper extremity Neuro: Alert Dysarthria, unchanged Motor: RUE: 4 -/5 proximal distal, unchanged RLE: 4/5 proximal distal Sensation diminished to light touch right hand, stable Right CN 6 palsy   Assessment/Plan: 1. Functional deficits which require 3+ hours per day of interdisciplinary therapy in a comprehensive inpatient rehab setting. Physiatrist is providing close team supervision and 24 hour management of active medical problems listed below. Physiatrist and rehab team continue  to assess barriers to discharge/monitor patient progress toward functional and medical goals   Care Tool:  Bathing    Body parts bathed by patient: Right arm, Left arm, Chest, Abdomen, Right upper leg, Left upper leg, Face   Body parts bathed by helper: Front perineal area, Buttocks, Right lower leg, Left lower leg     Bathing assist Assist Level: Moderate Assistance - Patient 50 - 74%     Upper Body Dressing/Undressing Upper body dressing   What is the patient wearing?: Pull over shirt    Upper body assist Assist Level: Moderate Assistance - Patient 50 - 74%    Lower Body Dressing/Undressing Lower body dressing      What is the patient wearing?: Incontinence brief     Lower body assist Assist for lower body dressing: Maximal Assistance - Patient 25 - 49%     Toileting Toileting    Toileting assist Assist for toileting: Maximal Assistance - Patient 25 - 49%     Transfers Chair/bed transfer  Transfers assist     Chair/bed transfer assist level: Minimal Assistance - Patient > 75%     Locomotion Ambulation   Ambulation assist   Ambulation activity did not occur: Safety/medical concerns (pt became emotional, did not have time for further mobility)  Assist level: Minimal Assistance - Patient > 75% Assistive device: Walker-rolling Max distance: 50'   Walk 10 feet activity   Assist  Walk 10 feet activity did not occur: Safety/medical concerns  Assist level: Minimal Assistance - Patient > 75% Assistive device: Walker-rolling   Walk 50 feet activity   Assist Walk 50 feet with 2 turns activity did not occur: Safety/medical concerns  Assist level: Minimal Assistance - Patient > 75% Assistive device: Walker-rolling    Walk 150 feet activity   Assist Walk 150 feet activity did not occur: Safety/medical concerns         Walk 10 feet on uneven surface  activity   Assist Walk 10 feet on uneven surfaces activity did not occur: Safety/medical  concerns         Wheelchair     Assist Is the patient using a wheelchair?: Yes Type of Wheelchair: Manual Wheelchair activity did not occur: Safety/medical concerns         Wheelchair 50 feet with 2 turns activity    Assist    Wheelchair 50 feet with 2 turns activity did not occur: Safety/medical concerns       Wheelchair 150 feet activity     Assist  Wheelchair 150 feet activity did not occur: Safety/medical concerns        Medical Problem List and Plan: 1.  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 Please see conference note from today as well.  2.  Antithrombotics: -DVT/anticoagulation: SCDs Pharmaceutical: Other (comment)             -antiplatelet therapy: N/A 3. Pain Management: Tylenol as needed for HA, neurogenic pain over chest and abd bilateral , has some signal abnormality in thalamus bilaterally   Baclofen 5 BID started on 9/20 4. Mood: Provide emotional support             -antipsychotic agents: N/A 5. Neuropsych: This patient is?  Fully capable of making decisions on her own behalf. 6. Skin/Wound Care: Routine skin checks 7. Fluids/Electrolytes/Nutrition: Routine in and outs 8.  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  Neurology follow-up conservative care 9.  Uncontrolled diabetes mellitus with hyperglycemia.  Hemoglobin A1c 10.7.   NovoLog 12 units 3 times daily, increase to 14 units on 9/16 Semglee 20 units twice daily.  Diabetic teaching- couldn't afford insulin at home.  CBG (last 3)  Recent Labs    01/18/21 1638 01/18/21 2118 01/19/21 0624  GLUCAP 156* 144*  120*   Improving control on 9/20 10.  Acute on chronic anemia.  Continue iron supplement.    Hemoglobin 11.1 on 9/19  Continue to monitor 11. Constipation: Cont chronulac 75m TID Patient states lactulose not working, however having bowel movements 12. Hiccups-?  Resolved 13. Thrombocytopenia  Platelets 106 on 9/19  Continue to monitor 14.  Cervical stenosis  MRI showing multiple abnormalities  Appreciate neurosurgery recs, no plans for intervention at present 15.  Transaminitis  Only mild elevation of AST, improving  16.  Hypoalbuminemia  Supplement initiated   LOS: 7 days A FACE TO FACE EVALUATION WAS PERFORMED  Kyuss Hale ALorie Phenix9/20/2022, 10:29 AM

## 2021-01-20 ENCOUNTER — Inpatient Hospital Stay (HOSPITAL_COMMUNITY): Payer: Medicaid Other

## 2021-01-20 LAB — GLUCOSE, CAPILLARY
Glucose-Capillary: 79 mg/dL (ref 70–99)
Glucose-Capillary: 205 mg/dL — ABNORMAL HIGH (ref 70–99)
Glucose-Capillary: 246 mg/dL — ABNORMAL HIGH (ref 70–99)
Glucose-Capillary: 215 mg/dL — ABNORMAL HIGH (ref 70–99)

## 2021-01-20 MED ORDER — BACLOFEN 5 MG HALF TABLET
5.0000 mg | ORAL_TABLET | Freq: Every day | ORAL | Status: DC
  Administered 2021-01-21: 5 mg via ORAL
  Filled 2021-01-20: qty 1

## 2021-01-20 NOTE — Progress Notes (Signed)
Pt transported to MRI via bed  [place]

## 2021-01-20 NOTE — Progress Notes (Signed)
Pt transported and arrived back from MRI via bed. Soft touch call light within reach.   Meagan Peck

## 2021-01-20 NOTE — Progress Notes (Addendum)
[place] PHYSICAL MEDICINE & REHABILITATION PROGRESS NOTE  Subjective/Complaints:  Patient seen sitting up in bed this morning.  She is tearful and states that she feels sleepy from medications.  She states she feels weak..  ROS: Denies CP, SOB, N/V/D  Objective: Vital Signs: Blood pressure (!) 149/67, pulse 89, temperature 97.7 F (36.5 C), temperature source Oral, resp. rate 18, height 4' 10"  (1.473 m), weight 62.2 kg, last menstrual period 12/03/2013, SpO2 100 %. No results found. Recent Labs    01/18/21 1133  WBC 4.6  HGB 11.1*  HCT 30.5*  PLT 106*    Recent Labs    01/18/21 0659  NA 139  K 4.1  CL 108  CO2 23  GLUCOSE 120*  BUN 14  CREATININE 0.77  CALCIUM 8.9     Intake/Output Summary (Last 24 hours) at 01/20/2021 1109 Last data filed at 01/19/2021 1819 Gross per 24 hour  Intake 240 ml  Output --  Net 240 ml         Physical Exam: BP (!) 149/67 (BP Location: Right Leg)   Pulse 89   Temp 97.7 F (36.5 C) (Oral)   Resp 18   Ht 4' 10"  (1.473 m)   Wt 62.2 kg   LMP 12/03/2013 (LMP Unknown)   SpO2 100%   BMI 28.66 kg/m  Constitutional: No distress . Vital signs reviewed. HENT: Normocephalic.  Atraumatic. Eyes: EOMI. No discharge. Cardiovascular: No JVD.  RRR. Respiratory: Normal effort.  No stridor.  Bilateral clear to auscultation. GI: Non-distended.  BS +. Skin: Warm and dry.  Intact. Psych: Tearful.  Normal behavior. Musc: No edema in extremities.  No tenderness in extremities. Neuro: Alert Dysarthria, stable Motor: RUE: 4 -/5 proximal distal, stable RLE: 4/5 proximal distal Sensation diminished to light touch right hand, stable Right CN 6 palsy   Assessment/Plan: 1. Functional deficits which require 3+ hours per day of interdisciplinary therapy in a comprehensive inpatient rehab setting. Physiatrist is providing close team supervision and 24 hour management of active medical problems listed below. Physiatrist and rehab team continue to  assess barriers to discharge/monitor patient progress toward functional and medical goals   Care Tool:  Bathing    Body parts bathed by patient: Right arm, Left arm, Chest, Abdomen, Right upper leg, Left upper leg, Face   Body parts bathed by helper: Front perineal area, Buttocks, Right lower leg, Left lower leg     Bathing assist Assist Level: Moderate Assistance - Patient 50 - 74%     Upper Body Dressing/Undressing Upper body dressing   What is the patient wearing?: Pull over shirt    Upper body assist Assist Level: Moderate Assistance - Patient 50 - 74%    Lower Body Dressing/Undressing Lower body dressing      What is the patient wearing?: Incontinence brief     Lower body assist Assist for lower body dressing: Maximal Assistance - Patient 25 - 49%     Toileting Toileting    Toileting assist Assist for toileting: Maximal Assistance - Patient 25 - 49%     Transfers Chair/bed transfer  Transfers assist     Chair/bed transfer assist level: Dependent - mechanical lift (stedy)     Locomotion Ambulation   Ambulation assist   Ambulation activity did not occur: Safety/medical concerns (pt became emotional, did not have time for further mobility)  Assist level: Minimal Assistance - Patient > 75% Assistive device: Walker-rolling Max distance: 50'   Walk 10 feet activity   Assist  Walk 10 feet activity did not occur: Safety/medical concerns  Assist level: Minimal Assistance - Patient > 75% Assistive device: Walker-rolling   Walk 50 feet activity   Assist Walk 50 feet with 2 turns activity did not occur: Safety/medical concerns  Assist level: Minimal Assistance - Patient > 75% Assistive device: Walker-rolling    Walk 150 feet activity   Assist Walk 150 feet activity did not occur: Safety/medical concerns         Walk 10 feet on uneven surface  activity   Assist Walk 10 feet on uneven surfaces activity did not occur: Safety/medical  concerns         Wheelchair     Assist Is the patient using a wheelchair?: Yes Type of Wheelchair: Manual Wheelchair activity did not occur: Safety/medical concerns         Wheelchair 50 feet with 2 turns activity    Assist    Wheelchair 50 feet with 2 turns activity did not occur: Safety/medical concerns       Wheelchair 150 feet activity     Assist  Wheelchair 150 feet activity did not occur: Safety/medical concerns        Medical Problem List and Plan: 1.  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  Antithrombotics: -DVT/anticoagulation: SCDs Pharmaceutical: Other (comment)             -antiplatelet therapy: N/A 3. Pain Management: Tylenol as needed for HA, neurogenic pain over chest and abd bilateral , has some signal abnormality in thalamus bilaterally   Baclofen 5 BID started on 9/20, changed to nightly on 9/21 due to lethargy 4. Mood: Provide emotional support             -antipsychotic agents: N/A 5. Neuropsych: This patient is?  Fully capable of making decisions on her own behalf. 6. Skin/Wound Care: Routine skin checks 7. Fluids/Electrolytes/Nutrition: Routine in and outs 8.  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  Neurology follow-up conservative care 9.  Uncontrolled diabetes mellitus with hyperglycemia.  Hemoglobin A1c 10.7.   NovoLog 12 units 3 times daily, increase to 14 units on 9/16 Semglee 20 units twice daily.  Diabetic teaching- couldn't afford insulin at home.  CBG (last 3)  Recent Labs    01/19/21 1654 01/19/21 2104 01/20/21 0619  GLUCAP 161* 106* 79   ?  Improving on 9/21 10.  Acute on chronic anemia.  Continue iron supplement.    Hemoglobin 11.1 on 9/19  Continue to monitor 11. Constipation: Cont chronulac 51m  TID Patient states lactulose not working, however having bowel movements 12. Hiccups-?  Resolved 13. Thrombocytopenia  Platelets 106 on 9/19  Continue to monitor 14.  Cervical stenosis  MRI showing multiple abnormalities  Appreciate neurosurgery recs, no plans for intervention at present 15.  Transaminitis  Only mild elevation of AST, improving  16.  Hypoalbuminemia  Supplement initiated   LOS: 8 days A FACE TO FACE EVALUATION WAS PERFORMED  Aizik Reh ALorie Phenix9/21/2022, 11:09 AM

## 2021-01-20 NOTE — Progress Notes (Signed)
Physical Therapy Weekly Progress Note  Patient Details  Name: Meagan Peck MRN: [id number] Date of Birth: 02/05/1961  Beginning of progress report period: January 13, 2021 End of progress report period: January 20, 2021  Today's Date: 01/20/2021  Patient has met 1 of 3 short term goals.  Pt was initially making progress towards therapy goals of Supervision and was at min A level overall with RW and ambulating up to 50 ft. However, over the past few days the patient has demonstrated a decline in overall function and now requires mod to max A for bed mobility, is dependent for transfers with use of stedy, and is unable to maintain upright stance either in stedy or with RW and remains in trunk flexion. Pt also reports she feels weaker overall. Medical team is aware of functional decline and an MRI has been ordered. Pt remains very motivated to improve functionally and return home and participates in therapy sessions as well as she is able to.  Patient continues to demonstrate the following deficits muscle weakness, decreased cardiorespiratoy endurance, unbalanced muscle activation, and decreased sitting balance, decreased standing balance, decreased postural control, and decreased balance strategies and therefore will continue to benefit from skilled PT intervention to increase functional independence with mobility.  Patient  is not progressing towards goals at this time due to a decline in overall function over the past two days, however goals will not be adjusted at this time to allow time to recover and hopefully show improvement over the next week .  Continue plan of care.  PT Short Term Goals Week 1:  PT Short Term Goal 1 (Week 1): Pt will initiate gait training PT Short Term Goal 1 - Progress (Week 1): Met PT Short Term Goal 2 (Week 1): Pt will transfer with min A or better consistently PT Short Term Goal 2 - Progress (Week 1): Not met PT Short Term Goal 3 (Week 1): Pt will maintain  sitting balance without UE suppoty x 2 min PT Short Term Goal 3 - Progress (Week 1): Not met Week 2:  PT Short Term Goal 1 (Week 2): Pt will complete least restrictive transfer with min A consistently PT Short Term Goal 2 (Week 2): Pt will ambulate x 50 ft with LRAD and min A PT Short Term Goal 3 (Week 2): Pt will maintain unsupported sitting balance x 2 min with CGA PT Short Term Goal 4 (Week 2): Pt will initiate stair training  Skilled Therapeutic Interventions/Progress Updates:  Ambulation/gait training;Discharge planning;DME/adaptive equipment instruction;Functional mobility training;Pain management;Psychosocial support;Therapeutic Activities;UE/LE Strength taining/ROM;Community reintegration;Balance/vestibular training;Neuromuscular re-education;Patient/family Dealer;Therapeutic Exercise;UE/LE Coordination activities;Wheelchair propulsion/positioning   Therapy Documentation Precautions:  Precautions Precautions: Fall Precaution Comments: R sided weakness, fall risk Restrictions Weight Bearing Restrictions: No    Therapy/Group: Individual Therapy   Excell Seltzer, PT, DPT, CSRS 01/20/2021, 5:13 PM

## 2021-01-20 NOTE — Progress Notes (Signed)
Occupational Therapy Note  Patient Details  Name: Meagan Peck MRN: [id number] Date of Birth: 1960/12/04  Today's Date: 01/20/2021 OT Missed Time: 22 Minutes Missed Time Reason: Patient ill (comment);Other (comment) (noted decline in function per PT note; defer group participation at this time)  Pt missed 60 mins of group session as pt noted to have decline in function with pt no longer group appropriate. Will f/u as time allows to make up missed minutes.    Corinne Ports [facility] 01/20/2021, 3:54 PM

## 2021-01-20 NOTE — Progress Notes (Signed)
Physical Therapy Session Note  Patient Details  Name: Meagan Peck MRN: [id number] Date of Birth: 1960-12-05  Today's Date: 01/20/2021 PT Individual Time: 1100-1200; 1615-1650 PT Individual Time Calculation (min): 60 min and 35 min PT Missed Time: 10 min Missed Time Reason: patient fatigue  Short Term Goals: Week 1:  PT Short Term Goal 1 (Week 1): Pt will initiate gait training PT Short Term Goal 2 (Week 1): Pt will transfer with min A or better consistently PT Short Term Goal 3 (Week 1): Pt will maintain sitting balance without UE suppoty x 2 min  Skilled Therapeutic Interventions/Progress Updates:    Session 1: Pt received seated in bed, requesting to use the bathroom. Supine to sit with max A needed for BLE management and trunk control, HOB elevated and use of bedrail. Sit to stand with mod A to stedy. Stedy transfer to toilet due to increase in overall weakness this date. Pt is dependent for clothing management in standing with stedy. Mod A to stand from toilet to stedy. Stedy transfer to w/c. Sitting balance in w/c with BLE support and one UE support while attempting to brush her hair. Assisted pt with re-braiding her hair to prevent tangles. Obtained RUE lap tray for improved support in seated position as pt reports ongoing pain in RUE. Pt describes pain as being in her elbow, with palpation pt has tenderness at biceps insertion and palpable muscular tightness. Pt unable to contract biceps this AM and exhibits minimal tricep extension. Sit to stand with CGA to RW but pt unable to stand fully upright, has flexed trunk. Pt returned to sitting in w/c. Pt agreeable to remain seated in chair for lunch, needs in reach, quick release belt and chair alarm in place. Medical team updated on continued decline in overall function.  Session 2: Pt received seated in bed with husband at bedside, agreeable to PT session. No complaints of pain this session and improvement in ability to utilize RUE to  assist with bed mobility and transfers. Supine to sit with mod to max A for some LE management and trunk elevation. Sit to stand with min A to RW, pt unable to extend trunk for upright posture. Obtained stedy. Sit to stand x 5 reps to stedy with CGA. Pt tolerates standing about 30 sec at most before onset of trunk flexion and unable to return to upright position. Pt returned to supine with mod A needed for LE management. Pt left seated in bed with needs in reach, bed alarm in place. Pt missed 10 min of scheduled therapy session due to fatigue.  Therapy Documentation Precautions:  Precautions Precautions: Fall Precaution Comments: R sided weakness, fall risk Restrictions Weight Bearing Restrictions: No   Therapy/Group: Individual Therapy   Excell Seltzer, PT, DPT, CSRS  01/20/2021, 12:24 PM

## 2021-01-20 NOTE — Progress Notes (Signed)
Occupational Therapy Session Note  Patient Details  Name: Meagan Peck MRN: [id number] Date of Birth: January 23, 1961  Today's Date: 01/20/2021 OT Individual Time: 1005-1032 OT Individual Time Calculation (min): 27 min    Short Term Goals: Week 2:  OT Short Term Goal 1 (Week 2): Pt will perform LB dress at sit <> stand Mod A with AE PRN OT Short Term Goal 2 (Week 2): Pt will perform toileting tasks with mod A OT Short Term Goal 3 (Week 2): Pt will complete bathing tasks with min A at shower level  Skilled Therapeutic Interventions/Progress Updates:    Pt in bed crying hysterically when therapist entered.  She reported being upset that she couldn't use the TV remote and couldn't "do things for herself".  Therapist talked to her and she was able to calm down with focus of session on increasing RUE movements and use.  She declined OOB to the chair however, so exercises were completed in supine with HOB elevated.  She performed 10 reps of shoulder flexion, elbow flexion, wrist extension and digit flexion/extension  AAROM for the RUE.  She also completed 10 reps of AROM for shoulder flexion and elbow flexion/extension on the LUE.  Noted some inconsistency with digit flexion/extension on the right as some times she was able to complete more AROM than on other attempts.  Once completed had her work on simulated self feeding with use of the RUE with universal cuff and bent fork.  Mod assist needed to stapb fruit with the fork and bring to mouth.  She was able to use the mug with the lid and straw with the RUE as well with mod assist to drink.  Finished with session with pt resting in the bed with the soft touch call button and phone in reach with the safety alarm in place.  TV was placed on channel pt requested.     Therapy Documentation Precautions:  Precautions Precautions: Fall Precaution Comments: R sided weakness, fall risk Restrictions Weight Bearing Restrictions: No  Pain: Pain  Assessment Pain Scale: Faces Pain Score: 0-No pain ADL: See Care Tool Section for some details of mobility and selfcare   Therapy/Group: Individual Therapy  Hudsen Fei OTR/L 01/20/2021, 12:26 PM

## 2021-01-21 LAB — GLUCOSE, CAPILLARY
Glucose-Capillary: 146 mg/dL — ABNORMAL HIGH (ref 70–99)
Glucose-Capillary: 165 mg/dL — ABNORMAL HIGH (ref 70–99)
Glucose-Capillary: 104 mg/dL — ABNORMAL HIGH (ref 70–99)
Glucose-Capillary: 126 mg/dL — ABNORMAL HIGH (ref 70–99)

## 2021-01-21 LAB — AMMONIA: Ammonia: 121 umol/L — ABNORMAL HIGH (ref 9–35)

## 2021-01-21 NOTE — Progress Notes (Signed)
[facility] PHYSICAL MEDICINE & REHABILITATION PROGRESS NOTE  Subjective/Complaints:  Patient seen sitting up in bed this morning.  She is tearful and states that she feels sleepy from medications.  She states she feels weak..  ROS: Denies CP, SOB, N/V/D  Objective: Vital Signs: Blood pressure (!) 148/59, pulse 84, temperature 98.3 F (36.8 C), temperature source Oral, resp. rate 18, height 4' 10"  (1.473 m), weight 62.2 kg, last menstrual period 12/03/2013, SpO2 100 %. MR BRAIN WO CONTRAST  Result Date: 01/21/2021 CLINICAL DATA:  MS, osmotic demyelination EXAM: MRI HEAD WITHOUT CONTRAST TECHNIQUE: Multiplanar, multiecho pulse sequences of the brain and surrounding structures were obtained without intravenous contrast. COMPARISON:  01/10/2021 FINDINGS: Brain: Redemonstrated abnormal diffusion and T2 hyperintense signal symmetrically throughout the pons, sparing the periphery and bilateral corticospinal tracts (series 5, image 70 and series 11, image 10). T2 hyperintense signal is also now more pronounced in the middle cerebellar peduncles bilaterally (series 10, image 9), midbrain (series 10, image 13), bilateral thalami and internal capsules (series 10, image 14), and extending into the bilateral corona radiata (series 10, image 18). In the pons, the signal is associated with T1 hypointensity (series 16, image 18); additional T2 hyperintense signal locations are not associated with T1 hypointensity. Similar mild expansion of the pons. No associated hemorrhage. No mass effect or midline shift. Ventricles and sulci are normal for age. Additional scattered T2 hyperintense foci in the subcortical white matter may be the sequela of chronic small vessel ischemic disease. Vascular: Normal flow voids. Skull and upper cervical spine: No acute osseous abnormality. C4-C5 degenerative changes, with mild mass effect on the cord. Sinuses/Orbits: Mucosal thickening in the left sphenoid sinus. Status post bilateral  lens replacements. Other: Trace fluid in right mastoid air cells. IMPRESSION: Redemonstrated findings concerning for osmotic demyelination, with redemonstrated T2 hyperintense and T1 hypointense signal in the pons, sparing the bilateral corticospinal tracts. Involvement of the basal ganglia, midbrain and subcortical white matter also support a diagnosis osmotic demyelination. Electronically Signed   By: Merilyn Baba M.D.   On: 01/21/2021 01:14   Recent Labs    01/18/21 1133  WBC 4.6  HGB 11.1*  HCT 30.5*  PLT 106*    No results for input(s): NA, K, CL, CO2, GLUCOSE, BUN, CREATININE, CALCIUM in the last 72 hours.   Intake/Output Summary (Last 24 hours) at 01/21/2021 0847 Last data filed at 01/20/2021 1700 Gross per 24 hour  Intake 236 ml  Output --  Net 236 ml         Physical Exam: BP (!) 148/59   Pulse 84   Temp 98.3 F (36.8 C) (Oral)   Resp 18   Ht 4' 10"  (1.473 m)   Wt 62.2 kg   LMP 12/03/2013 (LMP Unknown)   SpO2 100%   BMI 28.66 kg/m  HEENT- jaundiced sclera General: No acute distress, appears tired Mood and affect are flat Heart: Regular rate and rhythm no rubs murmurs or extra sounds Lungs: Clear to auscultation, breathing unlabored, no rales or wheezes Abdomen: Positive bowel sounds, soft nontender to palpation, + ascites Extremities: No clubbing, cyanosis, or edema   Psych: no lability or agitation   Normal behavior. Musc: No edema in extremities.  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 Functional deficits which require 3+ hours per day of interdisciplinary therapy in a  comprehensive inpatient rehab setting. Physiatrist is providing close team supervision and 24 hour management of active medical problems listed below. Physiatrist and rehab team continue to assess barriers to discharge/monitor patient progress toward functional and medical goals   Care Tool:  Bathing    Body parts bathed by patient: Right arm, Left arm, Chest, Abdomen, Right upper leg, Left upper leg, Face   Body parts bathed by helper: Front perineal area, Buttocks, Right lower leg, Left lower leg     Bathing assist Assist Level: Moderate Assistance - Patient 50 - 74%     Upper Body Dressing/Undressing Upper body dressing   What is the patient wearing?: Pull over shirt    Upper body assist Assist Level: Moderate Assistance - Patient 50 - 74%    Lower Body Dressing/Undressing Lower body dressing      What is the patient wearing?: Incontinence brief     Lower body assist Assist for lower body dressing: Maximal Assistance - Patient 25 - 49%     Toileting Toileting    Toileting assist Assist for toileting: Maximal Assistance - Patient 25 - 49%     Transfers Chair/bed transfer  Transfers assist     Chair/bed transfer assist level: Dependent - mechanical lift (stedy)     Locomotion Ambulation   Ambulation assist   Ambulation activity did not occur: Safety/medical concerns (pt became emotional, did not have time for further mobility)  Assist level: Minimal Assistance - Patient > 75% Assistive device: Walker-rolling Max distance: 50'   Walk 10 feet activity   Assist  Walk 10 feet activity did not occur: Safety/medical concerns  Assist level: Minimal Assistance - Patient > 75% Assistive device: Walker-rolling   Walk 50 feet activity   Assist Walk 50 feet with 2 turns activity did not occur: Safety/medical concerns  Assist level: Minimal Assistance - Patient > 75% Assistive device: Walker-rolling    Walk 150 feet activity   Assist Walk 150 feet  activity did not occur: Safety/medical concerns         Walk 10 feet on uneven surface  activity   Assist Walk 10 feet on uneven surfaces activity did not occur: Safety/medical concerns         Wheelchair     Assist Is the patient using a wheelchair?: Yes Type of Wheelchair: Manual Wheelchair activity did not occur: Safety/medical concerns         Wheelchair 50 feet with 2 turns activity    Assist    Wheelchair 50 feet with 2 turns activity did not occur: Safety/medical concerns       Wheelchair 150 feet activity     Assist  Wheelchair 150 feet activity did not occur: Safety/medical concerns        Medical Problem List and Plan: 1.  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  Antithrombotics: -DVT/anticoagulation: SCDs Pharmaceutical: Other (comment)             -antiplatelet therapy: N/A 3. Pain Management: Tylenol d/ced given hepatic failure neurogenic pain over chest and abd bilateral , has some signal abnormality in thalamus bilaterally   Baclofen 5 BID started on  9/20, changed to nightly on 9/21 due to lethargy Check serum ammonia as well  4. Mood: Provide emotional support             -antipsychotic agents: N/A 5. Neuropsych: This patient is?  Fully capable of making decisions on her own behalf. 6. Skin/Wound Care: Routine skin checks 7. Fluids/Electrolytes/Nutrition: Routine in and outs 8.  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  Neurology follow-up conservative care 9.  Uncontrolled diabetes mellitus with hyperglycemia.  Hemoglobin A1c 10.7.   NovoLog 12 units 3 times daily, increase to 14 units on 9/16 Semglee 20 units twice daily.  Diabetic teaching- couldn't afford insulin at home.  CBG (last 3)  Recent Labs    01/20/21 1714  01/20/21 2108 01/21/21 0612  GLUCAP 246* 215* 146*   ?  Improving on 9/21 10.  Acute on chronic anemia.  Continue iron supplement.    Hemoglobin 11.1 on 9/19  Continue to monitor 11. Constipation: Cont chronulac 39m TID Patient states lactulose not working, however having bowel movements 12. Hiccups-?  Resolved 13. Thrombocytopenia  Platelets 106 on 9/19  Continue to monitor 14.  Cervical stenosis  MRI showing multiple abnormalities  Appreciate neurosurgery recs, no plans for intervention at present 15.  Transaminitis  Only mild elevation of AST, improving  16.  Hypoalbuminemia  Supplement initiated   LOS: 9 days A FACE TO FACE EVALUATION WAS PERFORMED  ACharlett Blake9/22/2022, 8:47 AM

## 2021-01-21 NOTE — Progress Notes (Signed)
Physical Therapy Session Note  Patient Details  Name: Meagan Peck MRN: [id number] Date of Birth: 07-28-1960  Today's Date: 01/21/2021 PT Individual Time: 1123-1204 PT Individual Time Calculation (min): 41 min   Short Term Goals: Week 2:  PT Short Term Goal 1 (Week 2): Pt will complete least restrictive transfer with min A consistently PT Short Term Goal 2 (Week 2): Pt will ambulate x 50 ft with LRAD and min A PT Short Term Goal 3 (Week 2): Pt will maintain unsupported sitting balance x 2 min with CGA PT Short Term Goal 4 (Week 2): Pt will initiate stair training  Skilled Therapeutic Interventions/Progress Updates:    Pt received supine in bed and appears lethargic with noted significant dysarthria and significantly impaired strength in  BUEs and B LEs as well as poor management of oral secretions (utilized suction during session as needed). Assessed vitals: BP 132/64 (MAP 84), HR 80bpm, SpO2 100%. Discussed with [name], PA patients current presentation and medical plan with lab tech arriving for blood draw. PA cleared patient to participate in therapy per pt's tolerance.  Supine>sitting R EOB, HOB partially elevated, with +2 total assist for B LE management off EOB and trunk upright. Sitting EOB requires consistent min assist to maintain midline with varying anterior and posterior LOB with pt unable to use UEs to assist with trunk control.   Tolerated sitting EOB ~20mnutes with cuing to maintain upright/extended head/trunk positioning with attempts at performing L UE grasping task to hold shampoo container with pt's hand resting in flexed position and only brief moments of active finger flexion noted.  Sit>supine with +2 total assist for trunk descent and B LE management onto bed. Pt left supine in bed with needs in reach, soft call button in hand, HOB elevated due to increased secretions, and bed alarm on.    Therapy Documentation Precautions:  Precautions Precautions: Fall Precaution  Comments: R sided weakness, fall risk Restrictions Weight Bearing Restrictions: No   Pain:  No reports of pain throughout session.   Therapy/Group: Individual Therapy  CTawana Scale, PT, DPT, NCS, CSRS 01/21/2021, 8:01 AM

## 2021-01-21 NOTE — Plan of Care (Signed)
Pt's plan of care adjusted to 15/7 after speaking with care team and discussed with MD in team conference as pt currently unable to tolerate current therapy schedule with OT, PT, and SLP.

## 2021-01-21 NOTE — Progress Notes (Signed)
Speech Language Pathology Daily Session Note  Patient Details  Name: JAYLENNE HAMELIN MRN: [id number] Date of Birth: [birth year]  Today's Date: 01/21/2021 SLP Individual Time: 1015-1045 SLP Individual Time Calculation (min): 30 min  Short Term Goals: Week 1: SLP Short Term Goal 1 (Week 1): Patient will demonstrate adequate toleration of regular solids as evidenced by efficient mastication, oral control and swallow initiation with supervision to minA verbal and demonstration cues. SLP Short Term Goal 2 (Week 1): Patient will return-demonstrate to perform speech strategies (increase volume, emphasize/exaggerate articulatory placement, manner and transitions between phonemes) at phrase and sentence level with minA verbal and modeling cues. SLP Short Term Goal 3 (Week 1): Patient will demonstrate adequate coordination of respiratory support with phonation at phrase and sentence level with minA verbal and modeling cues. SLP Short Term Goal 4 (Week 1): Patient will demonstrate 90% intelligibility of speech at 3-4 sentence oral reading level with minA verbal cues for use of strateiges.  Skilled Therapeutic Interventions:   Patient seen for skilled ST session focusing on speech goals. Patient was in bed when SLP arrived and although she denied being tired, she was very lethargic, speech significantly dysarthric and weak and patient having trouble keeping eyes open at times. With maximal verbal and question cues, SLP was able to decipher that patient would like her lactolose medication changed from liquid to pill form if possible. She reported she did not eat any breakfast. She was pleased that CT head previous date did not show any changes. Patient was only able to participate minimally and SLP ended session early. SLP then returned at lunch time but patient very fatigued and said she was not hungry. SLP notified NT that patient was too lethargic to safely eat at this time.   Pain Pain Assessment Pain  Scale: 0-10 Faces Pain Scale: No hurt  Therapy/Group: Individual Therapy  Sonia Baller, MA, CCC-SLP Speech Therapy

## 2021-01-21 NOTE — Progress Notes (Signed)
Occupational Therapy Session Note  Patient Details  Name: Meagan Peck MRN: [id number] Date of Birth: 05-29-1960  Today's Date: 01/21/2021 OT Individual Time: 0700-0730 OT Individual Time Calculation (min): 30 min  and Today's Date: 01/21/2021 OT Missed Time: 72 Minutes Missed Time Reason: Patient fatigue (decrease in function)   Short Term Goals: Week 2:  OT Short Term Goal 1 (Week 2): Pt will perform LB dress at sit <> stand Mod A with AE PRN OT Short Term Goal 2 (Week 2): Pt will perform toileting tasks with mod A OT Short Term Goal 3 (Week 2): Pt will complete bathing tasks with min A at shower level  Skilled Therapeutic Interventions/Progress Updates:    Pt resting in bed upon arrival. Attempted to engage pt in self feeding and getting OOB. Pt very lethargic with dysarthric/slurred speech. Pt unable to grasp OTA's fingers with her RUE or LUE. Pt grimaced with RUE PROM. PT declined breakfast but did state she ate dinner the previous night. Pt missed 45 mins skilled OT services 2/2 fatigue/decrease in function.  Therapy Documentation Precautions:  Precautions Precautions: Fall Precaution Comments: R sided weakness, fall risk Restrictions Weight Bearing Restrictions: No General: General OT Amount of Missed Time: 45 Minutes Pain:  Pt c/o general malaise   Therapy/Group: Individual Therapy  Leroy Libman 01/21/2021, 8:01 AM

## 2021-01-21 NOTE — Progress Notes (Signed)
Physical Therapy Session Note  Patient Details  Name: Meagan Peck MRN: [id number] Date of Birth: 1961/02/07  Today's Date: 01/21/2021 PT Individual Time: 0905-1015 PT Individual Time Calculation (min): 70 min   Short Term Goals: Week 2:  PT Short Term Goal 1 (Week 2): Pt will complete least restrictive transfer with min A consistently PT Short Term Goal 2 (Week 2): Pt will ambulate x 50 ft with LRAD and min A PT Short Term Goal 3 (Week 2): Pt will maintain unsupported sitting balance x 2 min with CGA PT Short Term Goal 4 (Week 2): Pt will initiate stair training  Skilled Therapeutic Interventions/Progress Updates: Pt presented in bed lethargic but agreeable to therapy. Pt denies pain throughout session. Pt noted to be incontinent of bladder. Performed rolling L/R max to total A with total A for peri-care. Pt then performed supine to sit with max to total A to EOB. Pt was unable to maintain neutral sitting position at EOB unsupported with noted R lateral lean. PTA noted due to decline in function pt unsafe to transfer to w/c unassisted therefore pt performed lateral scoots total A with use of draw sheet and returned to supine total A. PTA obtained assistance returned pt to sitting EOB total A. Slide board transfer then performed with max to total A x 2. Pt unable to safely assist with either UE for positioning or facilitation of transfer. PTA noted that pt unable to maintain neutral sitting in w/c with pt stating she can feel that she's leaning however unable to correct. Pt transported to day room and attempted to participate in Cybex Kinetron 90cm/sec for reciprocal activity. Pt was only able to minimally engage LLE and showed no activation in RLE. PTA thus provided assist to BLE  2 set of 10 cycles for reciprocal activity. Once completed pt was able to perform B knee extension however unable to achieve full range. Pt also noted to be pooling secretions however was able to swallow when cued. Pt  transported back to room and performed Slide board transfer to bed in same manner as prior. Pt required total A for sit to supine and repositioning. Pt's nurse and [name], [place] notified of pt's disposition. Pt left in bed at end of session with bed alarm on, call bell within reach and needs met.       Therapy Documentation Precautions:  Precautions Precautions: Fall Precaution Comments: R sided weakness, fall risk Restrictions Weight Bearing Restrictions: No General:   Vital Signs: Therapy Vitals Temp: 99.3 F (37.4 C) Temp Source: Oral Pulse Rate: 80 Resp: 20 BP: (!) 141/60 Patient Position (if appropriate): Sitting Oxygen Therapy SpO2: 98 % O2 Device: Room Air Pain:   Mobility:   Locomotion :    Trunk/Postural Assessment :    Balance:   Exercises:   Other Treatments:      Therapy/Group: Individual Therapy  Remingtyn Depaola 01/21/2021, 2:38 PM

## 2021-01-22 ENCOUNTER — Inpatient Hospital Stay (HOSPITAL_COMMUNITY): Payer: Medicaid Other

## 2021-01-22 ENCOUNTER — Other Ambulatory Visit (HOSPITAL_BASED_OUTPATIENT_CLINIC_OR_DEPARTMENT_OTHER): Payer: Self-pay

## 2021-01-22 LAB — GLUCOSE, CAPILLARY
Glucose-Capillary: 117 mg/dL — ABNORMAL HIGH (ref 70–99)
Glucose-Capillary: 168 mg/dL — ABNORMAL HIGH (ref 70–99)
Glucose-Capillary: 140 mg/dL — ABNORMAL HIGH (ref 70–99)
Glucose-Capillary: 174 mg/dL — ABNORMAL HIGH (ref 70–99)

## 2021-01-22 MED ORDER — GADOBUTROL 1 MMOL/ML IV SOLN
6.2000 mL | Freq: Once | INTRAVENOUS | Status: AC | PRN
  Administered 2021-01-22: 6.2 mL via INTRAVENOUS

## 2021-01-22 MED ORDER — LACTULOSE 10 GM/15ML PO SOLN
30.0000 g | Freq: Every day | ORAL | Status: DC
  Administered 2021-01-22 – 2021-01-29 (×32): 30 g via ORAL
  Filled 2021-01-22 (×29): qty 45

## 2021-01-22 NOTE — Progress Notes (Signed)
Speech Language Pathology Weekly Progress and Session Note  Patient Details  Name: EMEREE MAHLER MRN: [id number] Date of Birth: June 20, 1960  Beginning of progress report period: 01/13/2021 End of progress report period: 01/22/2021  Today's Date: 01/22/2021 SLP Individual Time: 1430-1450 SLP Individual Time Calculation (min): 20 min  Short Term Goals: Week 1: SLP Short Term Goal 1 (Week 1): Patient will demonstrate adequate toleration of regular solids as evidenced by efficient mastication, oral control and swallow initiation with supervision to minA verbal and demonstration cues. SLP Short Term Goal 1 - Progress (Week 1): Other (comment) (Patient had been progressing but had significant decline in medical status past few days starting 9/21) SLP Short Term Goal 2 (Week 1): Patient will return-demonstrate to perform speech strategies (increase volume, emphasize/exaggerate articulatory placement, manner and transitions between phonemes) at phrase and sentence level with minA verbal and modeling cues. SLP Short Term Goal 2 - Progress (Week 1): Other (comment) (Patient had been progressing but had significant decline in medical status past few days starting 9/21) SLP Short Term Goal 3 (Week 1): Patient will demonstrate adequate coordination of respiratory support with phonation at phrase and sentence level with minA verbal and modeling cues. SLP Short Term Goal 3 - Progress (Week 1): Other (comment) (Patient had been progressing but had significant decline in medical status past few days starting 9/21) SLP Short Term Goal 4 (Week 1): Patient will demonstrate 90% intelligibility of speech at 3-4 sentence oral reading level with minA verbal cues for use of strateiges. SLP Short Term Goal 4 - Progress (Week 1): Other (comment) (Patient had been progressing but had significant decline in medical status past few days starting 9/21)    New Short Term Goals: Week 2: SLP Short Term Goal 1 (Week 2): Patient  will tolerate regular solid and thin liquid PO's in small amounts without overt s/s aspiration or penetration with supervisionA. SLP Short Term Goal 2 (Week 2): Patient will verbally express basic wants/needs with modA verbal cues to improve speech intelligibilty. SLP Short Term Goal 3 (Week 2): SLP will adjust and/or add goals if/when patient's functional status improves.  Weekly Progress Updates:  Patient was progressing steadily with speech and swallow goals but unfortunately she started to exhibit functional, medical decline on 9/21. Repeat MRI with contrast ordered secondary to observed neurological decline. Patient continues to benefit from skilled SLP intervention for speech and swallow function however prognosis is unclear pending results of medical testing.   Intensity: Minumum of 1-2 x/day, 30 to 90 minutes Frequency: 3 to 5 out of 7 days Duration/Length of Stay: 10/4 Treatment/Interventions: Dysphagia/aspiration precaution training;Speech/Language facilitation;Patient/family education   Daily Session  Skilled Therapeutic Interventions: Patient seen for skilled ST session focusing on speech goals. Patient in bed and awake but continues to be very fatigued and weak. She was able to verbalize wants/needs and recent events with SLP providing frequent verbal cues, question cues to improve overall understanding as patient's speech intelligibility is less than 30%. Patient reported she ate 25% of breakfast but not any of lunch. When SLP asked her if she thought changing solids consistency to something more soft, she requested to not do that. She took several straw sips of water and did not exhibit any overt s/s aspiration or penetration. She was left in bed with all needs in reach.     General    Pain Pain Assessment Pain Scale: 0-10 Pain Score: 0-No pain  Therapy/Group: Individual Therapy  Sonia Baller, MA, CCC-SLP Speech Therapy

## 2021-01-22 NOTE — Progress Notes (Addendum)
[place] PHYSICAL MEDICINE & REHABILITATION PROGRESS NOTE  Subjective/Complaints:  Patient seen sitting up in bed this AM.  She states she did not sleep well overnight.  Speech is worse and she notes increased weakness, prominent neurological decline.  ROS: Denies CP, SOB, N/V/D  Objective: Vital Signs: Blood pressure 139/61, pulse 80, temperature 97.9 F (36.6 C), resp. rate 16, height 4' 10"  (1.473 m), weight 62.2 kg, last menstrual period 12/03/2013, SpO2 99 %. MR BRAIN WO CONTRAST  Result Date: 01/21/2021 CLINICAL DATA:  MS, osmotic demyelination EXAM: MRI HEAD WITHOUT CONTRAST TECHNIQUE: Multiplanar, multiecho pulse sequences of the brain and surrounding structures were obtained without intravenous contrast. COMPARISON:  01/10/2021 FINDINGS: Brain: Redemonstrated abnormal diffusion and T2 hyperintense signal symmetrically throughout the pons, sparing the periphery and bilateral corticospinal tracts (series 5, image 70 and series 11, image 10). T2 hyperintense signal is also now more pronounced in the middle cerebellar peduncles bilaterally (series 10, image 9), midbrain (series 10, image 13), bilateral thalami and internal capsules (series 10, image 14), and extending into the bilateral corona radiata (series 10, image 18). In the pons, the signal is associated with T1 hypointensity (series 16, image 18); additional T2 hyperintense signal locations are not associated with T1 hypointensity. Similar mild expansion of the pons. No associated hemorrhage. No mass effect or midline shift. Ventricles and sulci are normal for age. Additional scattered T2 hyperintense foci in the subcortical white matter may be the sequela of chronic small vessel ischemic disease. Vascular: Normal flow voids. Skull and upper cervical spine: No acute osseous abnormality. C4-C5 degenerative changes, with mild mass effect on the cord. Sinuses/Orbits: Mucosal thickening in the left sphenoid sinus. Status post bilateral lens  replacements. Other: Trace fluid in right mastoid air cells. IMPRESSION: Redemonstrated findings concerning for osmotic demyelination, with redemonstrated T2 hyperintense and T1 hypointense signal in the pons, sparing the bilateral corticospinal tracts. Involvement of the basal ganglia, midbrain and subcortical white matter also support a diagnosis osmotic demyelination. Electronically Signed   By: Merilyn Baba M.D.   On: 01/21/2021 01:14   No results for input(s): WBC, HGB, HCT, PLT in the last 72 hours.  No results for input(s): NA, K, CL, CO2, GLUCOSE, BUN, CREATININE, CALCIUM in the last 72 hours.   Intake/Output Summary (Last 24 hours) at 01/22/2021 1111 Last data filed at 01/22/2021 0806 Gross per 24 hour  Intake 219 ml  Output --  Net 219 ml         Physical Exam: BP 139/61 (BP Location: Right Arm)   Pulse 80   Temp 97.9 F (36.6 C)   Resp 16   Ht 4' 10"  (1.473 m)   Wt 62.2 kg   LMP 12/03/2013 (LMP Unknown)   SpO2 99%   BMI 28.66 kg/m  Constitutional: No distress . Vital signs reviewed. HENT: Normocephalic.  Atraumatic. Eyes: Disconjugate gaze. No discharge. Cardiovascular: No JVD.  RRR. Respiratory: Normal effort.  No stridor.  Bilateral clear to auscultation. GI: Non-distended.  BS +. Skin: Warm and dry.  Intact. Psych: Flat.  Slowed. Musc: No edema in extremities.  No tenderness in extremities. Neuro: Alert Dysarthria, worse Motor: RUE: 0/5 proximal distal,  RLE: 0/5 proximal distal 4+ on left side No asterixis Sensation diminished to light touch right hand, stable  Assessment/Plan: 1. Functional deficits which require 3+ hours per day of interdisciplinary therapy in a comprehensive inpatient rehab setting. Physiatrist is providing close team supervision and 24 hour management of active medical problems listed below. Physiatrist and rehab  team continue to assess barriers to discharge/monitor patient progress toward functional and medical goals   Care  Tool:  Bathing    Body parts bathed by patient: Right arm, Left arm, Chest, Abdomen, Right upper leg, Left upper leg, Face   Body parts bathed by helper: Front perineal area, Buttocks, Right lower leg, Left lower leg     Bathing assist Assist Level: Moderate Assistance - Patient 50 - 74%     Upper Body Dressing/Undressing Upper body dressing   What is the patient wearing?: Pull over shirt    Upper body assist Assist Level: Moderate Assistance - Patient 50 - 74%    Lower Body Dressing/Undressing Lower body dressing      What is the patient wearing?: Incontinence brief     Lower body assist Assist for lower body dressing: Maximal Assistance - Patient 25 - 49%     Toileting Toileting    Toileting assist Assist for toileting: Maximal Assistance - Patient 25 - 49%     Transfers Chair/bed transfer  Transfers assist     Chair/bed transfer assist level: 2 Helpers     Locomotion Ambulation   Ambulation assist   Ambulation activity did not occur: Safety/medical concerns (pt became emotional, did not have time for further mobility)  Assist level: Minimal Assistance - Patient > 75% Assistive device: Walker-rolling Max distance: 50'   Walk 10 feet activity   Assist  Walk 10 feet activity did not occur: Safety/medical concerns  Assist level: Minimal Assistance - Patient > 75% Assistive device: Walker-rolling   Walk 50 feet activity   Assist Walk 50 feet with 2 turns activity did not occur: Safety/medical concerns  Assist level: Minimal Assistance - Patient > 75% Assistive device: Walker-rolling    Walk 150 feet activity   Assist Walk 150 feet activity did not occur: Safety/medical concerns         Walk 10 feet on uneven surface  activity   Assist Walk 10 feet on uneven surfaces activity did not occur: Safety/medical concerns         Wheelchair     Assist Is the patient using a wheelchair?: Yes Type of Wheelchair: Manual Wheelchair  activity did not occur: Safety/medical concerns  Wheelchair assist level: Dependent - Patient 0%      Wheelchair 50 feet with 2 turns activity    Assist    Wheelchair 50 feet with 2 turns activity did not occur: Safety/medical concerns       Wheelchair 150 feet activity     Assist  Wheelchair 150 feet activity did not occur: Safety/medical concerns        Medical Problem List and Plan: 1.  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 Discussed with Neuro- plan to repeat MRI with contrast, brain and C-spine Discussed functional decline with therapies 2.  Antithrombotics: -DVT/anticoagulation: SCDs Pharmaceutical: Other (comment)             -antiplatelet therapy: N/A 3. Pain Management: Tylenol d/ced given hepatic failure neurogenic pain over chest and abd bilateral , has some signal abnormality in thalamus bilaterally   Baclofen 5 BID started on 9/20, changed to nightly on 9/21 due to lethargy, d/ced on 9/23 4. Mood: Provide emotional support             -antipsychotic agents: N/A 5. Neuropsych:  This patient is?  Fully capable of making decisions on her own behalf. 6. Skin/Wound Care: Routine skin checks 7. Fluids/Electrolytes/Nutrition: Routine in and outs 8.  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  Neurology follow-up conservative care 9.  Uncontrolled diabetes mellitus with hyperglycemia.  Hemoglobin A1c 10.7.   NovoLog 12 units 3 times daily, increase to 14 units on 9/16 Semglee 20 units twice daily.  Diabetic teaching- couldn't afford insulin at home.  CBG (last 3)  Recent Labs    01/21/21 1658 01/21/21 2111 01/22/21 0556  GLUCAP 104* 126* 117*   Relatively controlled on 9/23 10.  Acute  on chronic anemia.  Continue iron supplement.    Hemoglobin 11.1 on 9/19  Continue to monitor 11. Constipation: Cont chronulac 5m TID Patient states lactulose not working, however having bowel movements 12. Hiccups-?  Resolved 13. Thrombocytopenia  Platelets 106 on 9/19, labs ordered for Monday  Continue to monitor 14.  Cervical stenosis  MRI showing multiple abnormalities  Appreciate neurosurgery recs, no plans for intervention at present 15.  Transaminitis  Only mild elevation of AST, labs ordered for Monday 16.  Hypoalbuminemia  Supplement initiated  17.  Hyperammonemia  Ammonia 121 on 9/22  Lactulose increased on 9/23  LOS: 10 days A FACE TO FACE EVALUATION WAS PERFORMED  Armonie Staten ALorie Phenix9/23/2022, 11:11 AM

## 2021-01-22 NOTE — Progress Notes (Addendum)
Patient return to the unit free of complications. Hob elevated, bed in lowest position, bed alarm set and call bell within reach.

## 2021-01-22 NOTE — Progress Notes (Signed)
Physical Therapy Session Note  Patient Details  Name: Meagan Peck MRN: [id number] Date of Birth: 04/27/1961  Today's Date: 01/22/2021 PT Individual Time: 1100-1200 and 1315-1330 PT Individual Time Calculation (min): 60 min and 15 min  Short Term Goals: Week 2:  PT Short Term Goal 1 (Week 2): Pt will complete least restrictive transfer with min A consistently PT Short Term Goal 2 (Week 2): Pt will ambulate x 50 ft with LRAD and min A PT Short Term Goal 3 (Week 2): Pt will maintain unsupported sitting balance x 2 min with CGA PT Short Term Goal 4 (Week 2): Pt will initiate stair training  Skilled Therapeutic Interventions/Progress Updates: Pt presented in bed agreeable to therapy. Pt denies pain during  session. Pt performed rolling L/R total A to check for incontinence. Performed supine to sit EOB with total A for BLE management and truncal support. Performed Slide board transfer to w/c with maxA x 2. During transfer pt noted to have some extensor tone in BLE requiring total A x 2 for safe repositioning in w/c. Pt then transported to ortho gym and set up in standing frame. Pt able to stand in frame ~24mn however pt with limited trunk control and required total A for BUE placement. Pt then moved over in front of BITS and attempted to participate in visual scanning activity. Pt required maxA for anterior weight shifting as well as AAROM for shoulder flexion and verbal cues for digit extension to tap screen. Pt transported back to room and performed Slide board transfer total A x 2. Pt was total A x 1 for sit to supine and pt left in bed at end of session with bed alarm on, call bell within reach and needs met.   Tx2: Pt presented in bed becoming labile upon PTA's entry. Pt anxious regarding "getting kicked out". Provided emotional support and explained to pt that she won't be removed from CIR based on current medical situation. Explained that all parties are aware that there has been a change and  all parties are providing support as best as possible. Explained to pt that due to her medical changes as to why she's not participating in therapy not because of refusals and that's out of her control. Explained to pt to just try to do what she can each day. PTA assisted pt holding weighted cup and pt took several sips of water. Pt appreciative of PTA"s time and left resting comfortably with bed alarm on and current needs met.      Therapy Documentation Precautions:  Precautions Precautions: Fall Precaution Comments: R sided weakness, fall risk Restrictions Weight Bearing Restrictions: No General: PT Amount of Missed Time (min): 30 Minutes Vital Signs: Therapy Vitals Temp: 97.8 F (36.6 C) Pulse Rate: 82 Resp: 18 BP: 135/61 Patient Position (if appropriate): Sitting Oxygen Therapy SpO2: 98 % O2 Device: Room Air Pain:   Mobility:   Locomotion :    Trunk/Postural Assessment :    Balance:   Exercises:   Other Treatments:      Therapy/Group: Individual Therapy  Zayquan Bogard 01/22/2021, 4:07 PM

## 2021-01-22 NOTE — Progress Notes (Signed)
Patient off the unit for a MRI.

## 2021-01-22 NOTE — Progress Notes (Signed)
Occupational Therapy Session Note  Patient Details  Name: Meagan Peck MRN: [id number] Date of Birth: [birth year]  Today's Date: 01/22/2021 OT Individual Time: 2353-6144 OT Individual Time Calculation (min): 47 min  and Today's Date: 01/22/2021 OT Missed Time: 28 Minutes Missed Time Reason: Patient fatigue   Short Term Goals: Week 2:  OT Short Term Goal 1 (Week 2): Pt will perform LB dress at sit <> stand Mod A with AE PRN OT Short Term Goal 2 (Week 2): Pt will perform toileting tasks with mod A OT Short Term Goal 3 (Week 2): Pt will complete bathing tasks with min A at shower level  Skilled Therapeutic Interventions/Progress Updates:    Pt resting in bed upon arrival. Pt declined donning clothes, preferring to remain in nightgown.  OT intervention with focus on bed mobility and BUE AROM/AAROM/PROM. RUE presents flaccid with no trace movement detected. LUE 3-/5 overall with weak grasp. Pt able to touch nose when sitting in bed with HOB elevated. Pt washed face with LUE when presented with wash cloth. Per NT, pt ate approx 25% of her potatoes but did not eat any other items on breakfast tray. Pt with ongoing slurred speech with minimal volume. Sitting balance not attempted this morning 2/2 no +2 available. Pt missed 28 mins skilled OT services 2/2 fatigue. Pt remained in bed with bed alarm activated. Soft call bell within reach.   Therapy Documentation Precautions:  Precautions Precautions: Fall Precaution Comments: R sided weakness, fall risk Restrictions Weight Bearing Restrictions: No General: General OT Amount of Missed Time: 28 Minutes   Pain:  Pt denies pain this morning  Therapy/Group: Individual Therapy  Leroy Libman 01/22/2021, 10:13 AM

## 2021-01-23 LAB — GLUCOSE, CAPILLARY
Glucose-Capillary: 144 mg/dL — ABNORMAL HIGH (ref 70–99)
Glucose-Capillary: 150 mg/dL — ABNORMAL HIGH (ref 70–99)
Glucose-Capillary: 189 mg/dL — ABNORMAL HIGH (ref 70–99)
Glucose-Capillary: 160 mg/dL — ABNORMAL HIGH (ref 70–99)

## 2021-01-23 NOTE — Progress Notes (Signed)
[facility] PHYSICAL MEDICINE & REHABILITATION PROGRESS NOTE  Subjective/Complaints:  Pt in bed. Appears comfortable. Denies any problems last night  ROS: Limited due to cognitive/behavioral   Objective: Vital Signs: Blood pressure 131/70, pulse 83, temperature 98.9 F (37.2 C), resp. rate 16, height 4' 10"  (1.473 m), weight 62.2 kg, last menstrual period 12/03/2013, SpO2 100 %. MR BRAIN W CONTRAST  Result Date: 01/22/2021 CLINICAL DATA:  Follow-up examination for demyelinating disease. EXAM: MRI HEAD WITH CONTRAST TECHNIQUE: Multiplanar, multiecho pulse sequences of the brain and surrounding structures were obtained with intravenous contrast. CONTRAST:  6.28m GADAVIST GADOBUTROL 1 MMOL/ML IV SOLN COMPARISON:  Previous noncontrast brain MRI from 01/20/2021. FINDINGS: Brain: Stable cerebral volume. There is persistent prominent T2 signal abnormality involving the central pons, with relative sparing of the periphery/corticospinal tracts. Additional less pronounced signal abnormality involving the posterior limbs of the internal capsules, with hazy involvement of the deep white matter of the bilateral corona radiata/centrum semi ovale again noted as well. Overall, appearance is similar as compared to recent noncontrast brain MRI. No associated enhancement following contrast administration. No other new intracranial abnormality on this limited postcontrast examination. No other mass lesion, mass effect, or midline shift. Ventricles stable in size without hydrocephalus. No extra-axial fluid collection. Pituitary gland suprasellar region normal. Midline structures intact and normal. No other abnormal enhancement. Vascular: Normal enhancement seen throughout the intracranial vasculature. Skull and upper cervical spine: Craniocervical junction within normal limits. Partially visualized upper cervical spine better evaluated on concomitant cervical spine MRI. Bone marrow signal intensity within normal limits. No  scalp soft tissue abnormality. Sinuses/Orbits: Globes and orbital soft tissues demonstrate no acute finding. Paranasal sinuses remain largely clear. No significant mastoid effusion. Other: None. IMPRESSION: 1. Persistent imaging findings suggestive of osmotic demyelination, relatively similar to previous exams. No associated enhancement. 2. No other new acute intracranial abnormality or other abnormal enhancement elsewhere within the brain. Electronically Signed   By: BJeannine BogaM.D.   On: 01/22/2021 23:01   MR CERVICAL SPINE W WO CONTRAST  Result Date: 01/22/2021 CLINICAL DATA:  Demyelinating disease. Increasing weakness. Neurological decline EXAM: MRI CERVICAL SPINE WITHOUT AND WITH CONTRAST TECHNIQUE: Multiplanar and multiecho pulse sequences of the cervical spine, to include the craniocervical junction and cervicothoracic junction, were obtained without and with intravenous contrast. CONTRAST:  6.238mGADAVIST GADOBUTROL 1 MMOL/ML IV SOLN COMPARISON:  MRI cervical spine 01/11/2021 FINDINGS: Alignment: Straightening of the cervical lordosis without static listhesis. Vertebrae: No fracture, evidence of discitis, or suspicious bone lesion. Incidental note of a small intraosseous hemangioma within the C7 vertebral body. Cord: Subtle areas of T2 hyperintense signal seen within the cervical cord at the C4-5 and C5 levels (series 2, images 8 and 9) seen only on sagittal T2 weighted sequences. It is unclear if these findings are artifactual. No definitive cord signal abnormality on axial T2 sequences or sagittal STIR sequence. No abnormal enhancement on postcontrast sequences. Posterior Fossa, vertebral arteries, paraspinal tissues: Please see concurrently obtained dedicated MRI of the brain for evaluation of the posterior fossa and brainstem. Vertebral artery flow voids are intact. No paraspinal abnormality. Disc levels: Multilevel cervical spondylosis is unchanged compared to recent prior cervical spine  MRI dated 01/11/2021. Please refer to that report for level by level detail. Most severely involved level is C4-5 where a disc osteophyte complex contributes to moderate to severe canal stenosis. There is also moderate canal stenosis at the C5-6 and C6-7 levels. Bilateral foraminal stenosis is also present at multiple levels. IMPRESSION: 1. Subtle areas  of T2 hyperintense signal within the cervical cord at the C4-5 and C5 levels seen only on sagittal T2 weighted sequences. It is unclear if these findings are artifactual. No definitive cord signal abnormality on the axial T2 or sagittal STIR sequence. A developing demyelinating lesion would be difficult to exclude. No abnormal enhancement on postcontrast sequences. 2. Unchanged cervical spondylosis compared to recent prior cervical spine MRI dated 01/11/2021. Moderate to severe canal stenosis at C4-5 and moderate canal stenosis at C5-6 and C6-7. Electronically Signed   By: Davina Poke D.O.   On: 01/22/2021 20:50   No results for input(s): WBC, HGB, HCT, PLT in the last 72 hours.  No results for input(s): NA, K, CL, CO2, GLUCOSE, BUN, CREATININE, CALCIUM in the last 72 hours.   Intake/Output Summary (Last 24 hours) at 01/23/2021 0918 Last data filed at 01/22/2021 1800 Gross per 24 hour  Intake 280 ml  Output --  Net 280 ml        Physical Exam: BP 131/70 (BP Location: Left Arm)   Pulse 83   Temp 98.9 F (37.2 C)   Resp 16   Ht 4' 10"  (1.473 m)   Wt 62.2 kg   LMP 12/03/2013 (LMP Unknown)   SpO2 100%   BMI 28.66 kg/m  Constitutional: No distress . Vital signs reviewed. HEENT: NCAT, EOMI, oral membranes moist Neck: supple Cardiovascular: RRR without murmur. No JVD    Respiratory/Chest: CTA Bilaterally without wheezes or rales. Normal effort    GI/Abdomen: BS +, non-tender, non-distended Ext: no clubbing, cyanosis, or edema Psych: flat, delayed  Musc: No edema in extremities.  No tenderness in extremities. Neuro: Alert, dysconjugate  gaze. Follows basic commands. Persistent dysarthria. Motor: RUE: 0/5 proximal distal,  RLE: 0/5 proximal distal 4+ on left side Sensation diminished to light touch right hand, stable  Assessment/Plan: 1. Functional deficits which require 3+ hours per day of interdisciplinary therapy in a comprehensive inpatient rehab setting. Physiatrist is providing close team supervision and 24 hour management of active medical problems listed below. Physiatrist and rehab team continue to assess barriers to discharge/monitor patient progress toward functional and medical goals   Care Tool:  Bathing    Body parts bathed by patient: Right arm, Left arm, Chest, Abdomen, Right upper leg, Left upper leg, Face   Body parts bathed by helper: Front perineal area, Buttocks, Right lower leg, Left lower leg     Bathing assist Assist Level: Moderate Assistance - Patient 50 - 74%     Upper Body Dressing/Undressing Upper body dressing   What is the patient wearing?: Pull over shirt    Upper body assist Assist Level: Moderate Assistance - Patient 50 - 74%    Lower Body Dressing/Undressing Lower body dressing      What is the patient wearing?: Incontinence brief     Lower body assist Assist for lower body dressing: Maximal Assistance - Patient 25 - 49%     Toileting Toileting    Toileting assist Assist for toileting: Maximal Assistance - Patient 25 - 49%     Transfers Chair/bed transfer  Transfers assist     Chair/bed transfer assist level: 2 Helpers     Locomotion Ambulation   Ambulation assist   Ambulation activity did not occur: Safety/medical concerns (pt became emotional, did not have time for further mobility)  Assist level: Minimal Assistance - Patient > 75% Assistive device: Walker-rolling Max distance: 50'   Walk 10 feet activity   Assist  Walk 10 feet activity  did not occur: Safety/medical concerns  Assist level: Minimal Assistance - Patient > 75% Assistive device:  Walker-rolling   Walk 50 feet activity   Assist Walk 50 feet with 2 turns activity did not occur: Safety/medical concerns  Assist level: Minimal Assistance - Patient > 75% Assistive device: Walker-rolling    Walk 150 feet activity   Assist Walk 150 feet activity did not occur: Safety/medical concerns         Walk 10 feet on uneven surface  activity   Assist Walk 10 feet on uneven surfaces activity did not occur: Safety/medical concerns         Wheelchair     Assist Is the patient using a wheelchair?: Yes Type of Wheelchair: Manual Wheelchair activity did not occur: Safety/medical concerns  Wheelchair assist level: Dependent - Patient 0%      Wheelchair 50 feet with 2 turns activity    Assist    Wheelchair 50 feet with 2 turns activity did not occur: Safety/medical concerns       Wheelchair 150 feet activity     Assist  Wheelchair 150 feet activity did not occur: Safety/medical concerns        Medical Problem List and Plan: 1.  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 - functional decline has been reviewed with therapies 2.  Antithrombotics: -DVT/anticoagulation: SCDs Pharmaceutical: Other (comment)             -antiplatelet therapy: N/A 3. Pain Management: Tylenol d/ced given hepatic failure neurogenic pain over chest and abd bilateral , has some signal abnormality in thalamus bilaterally   Baclofen 5 BID started on 9/20, changed to nightly on 9/21 due to lethargy, d/ced on 9/23 4. Mood: Provide emotional support             -antipsychotic agents: N/A 5. Neuropsych: This patient is?  Fully capable of making decisions on her own behalf. 6. Skin/Wound Care: Routine skin  checks 7. Fluids/Electrolytes/Nutrition: Routine in and outs 8.  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  Neurology follow-up conservative care 9.  Uncontrolled diabetes mellitus with hyperglycemia.  Hemoglobin A1c 10.7.   NovoLog 12 units 3 times daily, increase to 14 units on 9/16 Semglee 20 units twice daily.  Diabetic teaching- couldn't afford insulin at home.  CBG (last 3)  Recent Labs    01/22/21 1654 01/22/21 2122 01/23/21 0623  GLUCAP 140* 174* 144*  Relatively controlled on 9/24 10.  Acute on chronic anemia.  Continue iron supplement.    Hemoglobin 11.1 on 9/19  F/u Monday 11. Constipation: Cont chronulac 20m TID Patient states lactulose not working, however having bowel movements 12. Hiccups-?  Resolved 13. Thrombocytopenia  Platelets 106 on 9/19, labs ordered for Monday  Continue to monitor 14.  Cervical stenosis  MRI showing multiple abnormalities  Appreciate neurosurgery recs, no plans for intervention at present 15.  Transaminitis  Only mild elevation of AST, labs changed to Monday 16.  Hypoalbuminemia  Supplement initiated  17.  Hyperammonemia  Ammonia 121 on 9/22  Lactulose increased on 9/23---recheck Monday  LOS: 11 days A FACE TO FACE EVALUATION WAS PERFORMED  ZMeredith Staggers9/24/2022, 9:18 AM

## 2021-01-23 NOTE — Progress Notes (Signed)
Speech Language Pathology Daily Session Note  Patient Details  Name: Meagan Peck MRN: [id number] Date of Birth: 1960/10/05  Today's Date: 01/23/2021 SLP Individual Time: 0802-0842 SLP Individual Time Calculation (min): 40 min  Short Term Goals: Week 2: SLP Short Term Goal 1 (Week 2): Patient will tolerate regular solid and thin liquid PO's in small amounts without overt s/s aspiration or penetration with supervisionA. SLP Short Term Goal 2 (Week 2): Patient will verbally express basic wants/needs with modA verbal cues to improve speech intelligibilty. SLP Short Term Goal 3 (Week 2): SLP will adjust and/or add goals if/when patient's functional status improves.  Skilled Therapeutic Interventions:Skilled ST services focused on swallow and speech skills. Pt began crying when communicating with SLP over frustration with medical decline, SLP provided emotional support. Pt demonstrated 10-20% intelligibility requiring mod A fading ti min A verbal cues to utilize 1 word responses, SLP eventually adjusted to yes/no responses. Pt responded appropriately to yes/no question pertaining to wants/needs for PO consumption with breakfast. Pt did not want to eat or drink any options given, but was agreeable to consume x4 bites of a banana and x5 sips of water during the session prior to medication administration. Pt required x3 liquid wash sips of thin to initiate swallow for medication whole with thin, SLP suggested puree with medications whole. Pt was able to initiate swallow quickly with no oral residue, SLP updated documentation in room and educated nursing staff. Pt demonstrated no overt s/s aspiration on thin liquids via straw nor dys 3 textures (only item consumed) but did demonstrate mild anterior spillage of salvia and thin liquids with mild delay in swallow initiation. Pt was able to increase speech intelligibility to 30% at the word level by the end of the session, producing one word per breath. Pt was  unable to press soft touch call bell, therefore Slp educated NT to check in on pt periodically and use yes/no questions. Pt was left in room with bed alarm set. SLP recommends to continue skilled services.     Pain Pain Assessment Pain Score: 0-No pain  Therapy/Group: Individual Therapy  Faviola Klare  [facility] 01/23/2021, 8:44 AM

## 2021-01-23 NOTE — Progress Notes (Signed)
Physical Therapy Session Note  Patient Details  Name: Meagan Peck MRN: [id number] Date of Birth: 10/05/1960  Today's Date: 01/23/2021 PT Individual Time: 1300-1345 PT Individual Time Calculation (min): 45 min   Short Term Goals: Week 1:  PT Short Term Goal 1 (Week 1): Pt will initiate gait training PT Short Term Goal 1 - Progress (Week 1): Met PT Short Term Goal 2 (Week 1): Pt will transfer with min A or better consistently PT Short Term Goal 2 - Progress (Week 1): Not met PT Short Term Goal 3 (Week 1): Pt will maintain sitting balance without UE suppoty x 2 min PT Short Term Goal 3 - Progress (Week 1): Not met Week 2:  PT Short Term Goal 1 (Week 2): Pt will complete least restrictive transfer with min A consistently PT Short Term Goal 2 (Week 2): Pt will ambulate x 50 ft with LRAD and min A PT Short Term Goal 3 (Week 2): Pt will maintain unsupported sitting balance x 2 min with CGA PT Short Term Goal 4 (Week 2): Pt will initiate stair training  Skilled Therapeutic Interventions/Progress Updates:    Pt seated in w/c on arrival and agreeable to therapy. NT present to assist with finishing lunch. Pt demoes incr slurring of speech since this therapist last saw pt, only being able to make out 1-2 words at a time. Pt transported to therapy gym for time management and energy conservation. Upon reaching gym, pt became emotional and tearful, denied pain but was unable to communicate current need. Pt set up for standing frame with +2 assist for lateral leans to position sling. Upon standing in standing frame, pt reported pain in her knees and became increasingly emotional. Pt responded with nodding her head when asked if she needed to return to bed. Pt transported back to room and performed slideboard transfer with tot A x2. Tot A of 2 for bed positioning. Pt remained in bed after session and was left with all needs in reach and alarm active. Pt missed 15 min of scheduled therapy d/t fatigue.    Therapy Documentation Precautions:  Precautions Precautions: Fall Precaution Comments: R sided weakness, fall risk Restrictions Weight Bearing Restrictions: No General: PT Amount of Missed Time (min): 15 Minutes PT Missed Treatment Reason: Patient fatigue   Therapy/Group: Individual Therapy   C  01/23/2021, 1:47 PM  

## 2021-01-23 NOTE — Progress Notes (Signed)
Occupational Therapy Session Note  Patient Details  Name: Meagan Peck MRN: [id number] Date of Birth: 20-Nov-1960  Today's Date: 01/23/2021 OT Individual Time: 1120-1209 OT Individual Time Calculation (min): 49 min    Short Term Goals: Week 2:  OT Short Term Goal 1 (Week 2): Pt will perform LB dress at sit <> stand Mod A with AE PRN OT Short Term Goal 2 (Week 2): Pt will perform toileting tasks with mod A OT Short Term Goal 3 (Week 2): Pt will complete bathing tasks with min A at shower level  Skilled Therapeutic Interventions/Progress Updates:    Pt seen for OT treatment with focus on selfcare tasks.  She was in bed to start agreeable to working on dressing.  Noted pt incontinent of bladder in her brief so worked on washing front and back peri area first.  Max assist for rolling to both sides with total assist for washing her buttocks and front peri area.  She also needed total assist for donning the brief as well as LB clothing using same method, rolling side to side.  Total assist for transfer from supine to EOB with max assist for static sitting balance.  Pt with increased posterior LOB and to the right without ability to correct.  She was able to sit on the EOB for 10 mins with total +2 (pt 5%) for donning pullover shirt.  When therapist attempted to place shirt in the pt's left hand to assist with threading the right arm, she was not able to hold onto it or assist. With max hand over hand assist needed to thread over the right hand.  Pt with much less UE movement noted compared to her session with this therapist a few days ago.  Transferred to the wheelchair with total +2 (pt 10%) to complete session.  She was left with RUE supported on half lap tray as well as safety belt in place.  Soft touch call button in place as well however pt unable to activate it with the right or left hands.  Nursing made aware of pt being up in the wheelchair and needing close monitoring secondary to not being able  to activate the call button.  Also discussed her significant weakness compared to a few days ago.  Therapy Documentation Precautions:  Precautions Precautions: Fall Precaution Comments: R sided weakness, fall risk Restrictions Weight Bearing Restrictions: No  Pain:   No report of paint  ADL: See Care Tool Section for some details of mobility and selfcare   Therapy/Group: Individual Therapy  Latrece Nitta OTR/L 01/23/2021, 4:26 PM

## 2021-01-23 NOTE — Progress Notes (Signed)
Occupational Therapy Session Note  Patient Details  Name: Meagan Peck MRN: [id number] Date of Birth: December 31, 1960  Today's Date: 01/24/2021 OT Individual Time: 1535-1616 OT Individual Time Calculation (min): 41 min    Short Term Goals: Week 1:  OT Short Term Goal 1 (Week 1): Pt will complete BSC/toilet transfer Min A of 1 consistently OT Short Term Goal 1 - Progress (Week 1): Met OT Short Term Goal 2 (Week 1): Pt will perform LB dress at sit <> stand Mod A with AE PRN OT Short Term Goal 2 - Progress (Week 1): Progressing toward goal OT Short Term Goal 3 (Week 1): Pt will perform self feeding with AE PRN with Min A OT Short Term Goal 3 - Progress (Week 1): Met  Skilled Therapeutic Interventions/Progress Updates:    Pt greeted in bed, spouse at bedside. Pt with unintelligible speech, but did convey that she wasn't in any pain. Tried long sitting in bed with HOB elevated however unable to complete with 1 assist only. Therefore utilized chair position to work on trunk control during hair combing. Total hand over hand assistance to use the Lt hand to comb hair. Total A to comb out multiple tangles. Total A to correct Lt leaning posture to obtain more neutral alignment. Attempted supine reaches to target with pt unable to lift either UE against gravity. Downgraded activity to PROM of the Rt UE in all joints, noted some tightness in elbow and digits. AAROM on the Lt able to exhibit some shoulder and elbow movement with gravity minimized using the Lt hand, also some active grasp/release. Pt also with increased tone in digits and elbow on the Lt side. Gentle stretching of shoulder internal rotation performed bilaterally. Pt remained in bed at close of session, notified RN that OT placed pillows in pts room so that staff could assist her in obtaining sidelying position for pressure relief. No staff available to assist me in doing so before session exit.   Therapy Documentation Precautions:   Precautions Precautions: Fall Precaution Comments: R sided weakness, fall risk Restrictions Weight Bearing Restrictions: No  ADL: ADL Eating: Not assessed Grooming: Not assessed Upper Body Bathing: Minimal assistance Lower Body Bathing: Maximal assistance Upper Body Dressing: Moderate assistance Lower Body Dressing: Dependent Toileting: Not assessed Toilet Transfer: Not assessed      Therapy/Group: Individual Therapy  Skeet Simmer 01/24/2021, 4:37 PM

## 2021-01-24 LAB — GLUCOSE, CAPILLARY
Glucose-Capillary: 157 mg/dL — ABNORMAL HIGH (ref 70–99)
Glucose-Capillary: 217 mg/dL — ABNORMAL HIGH (ref 70–99)
Glucose-Capillary: 199 mg/dL — ABNORMAL HIGH (ref 70–99)
Glucose-Capillary: 204 mg/dL — ABNORMAL HIGH (ref 70–99)

## 2021-01-24 NOTE — Progress Notes (Addendum)
Physical Therapy Session Note  Patient Details  Name: Meagan Peck MRN: [id number] Date of Birth: 1960-11-06  Today's Date: 01/24/21 PT Individual Time: 58 - 27     Short Term Goals: Week 2:  PT Short Term Goal 1 (Week 2): Pt will complete least restrictive transfer with min A consistently PT Short Term Goal 2 (Week 2): Pt will ambulate x 50 ft with LRAD and min A PT Short Term Goal 3 (Week 2): Pt will maintain unsupported sitting balance x 2 min with CGA PT Short Term Goal 4 (Week 2): Pt will initiate stair training  Skilled Therapeutic Interventions/Progress Updates: Pt presents supine in bed and agreeable to therapy.  Pt required mod A for rolling side to side to don pants w/ verbal and occasional hand over hand cues for L hand placement.  Pt required assist for hooklying position and then maintenance.  Pt required Total A for R sidelying to sit transfer and then hand over hand to R hand for WB to assist w/ sitting posture.  Pt requires min to mod A and verbal cues for upright posture and midline, as pt pushes min to right.  Pt becomes more labile, crying, but unsure of what she is saying.  Pt required A + 2 for SB transfer to left w/ placement of SB.  Pt wheeled to small gym for time conservation.  Pt performed seated reaching and transferring small cones across midline.  Pt requires mod A for posture maintenance and assist for reaching and lifting cones from left to right.  Pt returned to room and required A + 2 for SB transfer w/c > bed w/ 6" platform for feet 2/2 petite height.  Bed alarm on and all needs in reach.     Therapy Documentation Precautions:  Precautions Precautions: Fall Precaution Comments: R sided weakness, fall risk Restrictions Weight Bearing Restrictions: No General:   Vital Signs: Therapy Vitals Temp: 99.3 F (37.4 C) Temp Source: Oral Pulse Rate: 87 Resp: 17 BP: (!) 123/52 Patient Position (if appropriate): Lying Oxygen Therapy SpO2: 100 % O2  Device: Room Air Pain:  pt states no pain, but then states some pain RUE. Pain Assessment Pain Score: 0-No pain     Therapy/Group: Individual Therapy  Ladoris Gene 01/25/2021, 4:16 PM

## 2021-01-24 NOTE — Progress Notes (Signed)
Speech Language Pathology Daily Session Note  Patient Details  Name: Meagan Peck MRN: [id number] Date of Birth: 1960/10/14  Today's Date: 01/24/2021 SLP Individual Time: 5041-3643 SLP Individual Time Calculation (min): 45 min  Short Term Goals: Week 2: SLP Short Term Goal 1 (Week 2): Patient will tolerate regular solid and thin liquid PO's in small amounts without overt s/s aspiration or penetration with supervisionA. SLP Short Term Goal 2 (Week 2): Patient will verbally express basic wants/needs with modA verbal cues to improve speech intelligibilty. SLP Short Term Goal 3 (Week 2): SLP will adjust and/or add goals if/when patient's functional status improves.  Skilled Therapeutic Interventions: Pt seen for skilled ST with focus on speech and swallowing goals. Pt seen with AM meal present, preferring softer solids vs regular solids and needing assist with self feeding. Pt noted with large amount of soft solid in oral cavity (banana) resulting in significantly delayed mastication time and difficulty with A-P transit. With verbal cues to reduce bite size and hand over hand assist, pt demonstrating more timely oral phase, no overt s/s aspiration across any trials. Pt provided education on Dys 3 diet, agreeable to diet downgrade at this time 2' functional decline and generalized weakness. Pt observed with whole pills in applesauce and tolerating very well. SLP facilitating speech intelligibility by providing mod A fading to min A verbal cues to utilize 1-2 word responses and increase breath support. Pt with moments of lability regarding home life (primarily dog Kiwi) and current function. Provided appropriate emotional support. Pt left in bed with alarm set and soft touch call light in reach. Cont ST POC.   Pain Pain Assessment Pain Scale: 0-10 Pain Score: 0-No pain  Therapy/Group: Individual Therapy  Dewaine Conger 01/24/2021, 8:37 AM

## 2021-01-24 NOTE — Progress Notes (Signed)
Orthopedic Tech Progress Note Patient Details:  [name] [name] 11, 1961 [id number]  Order was called into [facility] at 1711.  Patient ID: Meagan Peck, female   DOB: [birth year], 60 y.o.   MRN: [id number]  Carin Primrose 01/24/2021, 5:11 PM

## 2021-01-24 NOTE — Plan of Care (Signed)
Called by Dr. Posey Pronto regarding patient's functional decline.  Patient was seen in initial consult by Dr. Cheral Marker for osmotic demyelination of the pons in the setting of metabolic derangements. I reviewed the prior images and recommended repeat MRI with and without contrast which shows expected evolution but no worsening of the very large central pontine osmotic demyelination.  MRI of the C-spine was also performed which shows chronic changes, no myelopathy evident on imaging. Unfortunately, there are no other acute neurological interventions at this time. Appreciate the ongoing excellent rehabilitative management being provided by the PMR team. Please call with questions as needed. -- Amie Portland, MD Neurologist [facility] Pager: [phone]

## 2021-01-24 NOTE — Progress Notes (Addendum)
[place] PHYSICAL MEDICINE & REHABILITATION PROGRESS NOTE  Subjective/Complaints:    No issues overnite , some RIght shoulder pain, tends to lay on that side , discussed with PT   ROS: Limited due to cognitive/behavioral   Objective: Vital Signs: Blood pressure (!) 148/64, pulse 76, temperature 98.2 F (36.8 C), temperature source Oral, resp. rate 14, height 4' 10"  (1.473 m), weight 62.2 kg, last menstrual period 12/03/2013, SpO2 99 %. MR BRAIN W CONTRAST  Result Date: 01/22/2021 CLINICAL DATA:  Follow-up examination for demyelinating disease. EXAM: MRI HEAD WITH CONTRAST TECHNIQUE: Multiplanar, multiecho pulse sequences of the brain and surrounding structures were obtained with intravenous contrast. CONTRAST:  6.52m GADAVIST GADOBUTROL 1 MMOL/ML IV SOLN COMPARISON:  Previous noncontrast brain MRI from 01/20/2021. FINDINGS: Brain: Stable cerebral volume. There is persistent prominent T2 signal abnormality involving the central pons, with relative sparing of the periphery/corticospinal tracts. Additional less pronounced signal abnormality involving the posterior limbs of the internal capsules, with hazy involvement of the deep white matter of the bilateral corona radiata/centrum semi ovale again noted as well. Overall, appearance is similar as compared to recent noncontrast brain MRI. No associated enhancement following contrast administration. No other new intracranial abnormality on this limited postcontrast examination. No other mass lesion, mass effect, or midline shift. Ventricles stable in size without hydrocephalus. No extra-axial fluid collection. Pituitary gland suprasellar region normal. Midline structures intact and normal. No other abnormal enhancement. Vascular: Normal enhancement seen throughout the intracranial vasculature. Skull and upper cervical spine: Craniocervical junction within normal limits. Partially visualized upper cervical spine better evaluated on concomitant cervical  spine MRI. Bone marrow signal intensity within normal limits. No scalp soft tissue abnormality. Sinuses/Orbits: Globes and orbital soft tissues demonstrate no acute finding. Paranasal sinuses remain largely clear. No significant mastoid effusion. Other: None. IMPRESSION: 1. Persistent imaging findings suggestive of osmotic demyelination, relatively similar to previous exams. No associated enhancement. 2. No other new acute intracranial abnormality or other abnormal enhancement elsewhere within the brain. Electronically Signed   By: BJeannine BogaM.D.   On: 01/22/2021 23:01   MR CERVICAL SPINE W WO CONTRAST  Result Date: 01/22/2021 CLINICAL DATA:  Demyelinating disease. Increasing weakness. Neurological decline EXAM: MRI CERVICAL SPINE WITHOUT AND WITH CONTRAST TECHNIQUE: Multiplanar and multiecho pulse sequences of the cervical spine, to include the craniocervical junction and cervicothoracic junction, were obtained without and with intravenous contrast. CONTRAST:  6.290mGADAVIST GADOBUTROL 1 MMOL/ML IV SOLN COMPARISON:  MRI cervical spine 01/11/2021 FINDINGS: Alignment: Straightening of the cervical lordosis without static listhesis. Vertebrae: No fracture, evidence of discitis, or suspicious bone lesion. Incidental note of a small intraosseous hemangioma within the C7 vertebral body. Cord: Subtle areas of T2 hyperintense signal seen within the cervical cord at the C4-5 and C5 levels (series 2, images 8 and 9) seen only on sagittal T2 weighted sequences. It is unclear if these findings are artifactual. No definitive cord signal abnormality on axial T2 sequences or sagittal STIR sequence. No abnormal enhancement on postcontrast sequences. Posterior Fossa, vertebral arteries, paraspinal tissues: Please see concurrently obtained dedicated MRI of the brain for evaluation of the posterior fossa and brainstem. Vertebral artery flow voids are intact. No paraspinal abnormality. Disc levels: Multilevel cervical  spondylosis is unchanged compared to recent prior cervical spine MRI dated 01/11/2021. Please refer to that report for level by level detail. Most severely involved level is C4-5 where a disc osteophyte complex contributes to moderate to severe canal stenosis. There is also moderate canal stenosis at the C5-6 and  C6-7 levels. Bilateral foraminal stenosis is also present at multiple levels. IMPRESSION: 1. Subtle areas of T2 hyperintense signal within the cervical cord at the C4-5 and C5 levels seen only on sagittal T2 weighted sequences. It is unclear if these findings are artifactual. No definitive cord signal abnormality on the axial T2 or sagittal STIR sequence. A developing demyelinating lesion would be difficult to exclude. No abnormal enhancement on postcontrast sequences. 2. Unchanged cervical spondylosis compared to recent prior cervical spine MRI dated 01/11/2021. Moderate to severe canal stenosis at C4-5 and moderate canal stenosis at C5-6 and C6-7. Electronically Signed   By: Davina Poke D.O.   On: 01/22/2021 20:50   No results for input(s): WBC, HGB, HCT, PLT in the last 72 hours.  No results for input(s): NA, K, CL, CO2, GLUCOSE, BUN, CREATININE, CALCIUM in the last 72 hours.   Intake/Output Summary (Last 24 hours) at 01/24/2021 1047 Last data filed at 01/24/2021 0819 Gross per 24 hour  Intake 218 ml  Output --  Net 218 ml         Physical Exam: BP (!) 148/64 (BP Location: Right Arm)   Pulse 76   Temp 98.2 F (36.8 C) (Oral)   Resp 14   Ht 4' 10"  (1.473 m)   Wt 62.2 kg   LMP 12/03/2013 (LMP Unknown)   SpO2 99%   BMI 28.66 kg/m   General: No acute distress Mood and affect are appropriate Heart: Regular rate and rhythm no rubs murmurs or extra sounds Lungs: Clear to auscultation, breathing unlabored, no rales or wheezes Abdomen: Positive bowel sounds, soft nontender to palpation, nondistended Extremities: No clubbing, cyanosis, or edema Skin: No evidence of breakdown,  no evidence of rash  Neuro: Alert, dysconjugate gaze. Follows basic commands. Persistent dysarthria. Motor: RUE: 0/5 proximal distal,  RLE: 0/5 proximal distal 4+ on left side Sensation diminished to light touch right hand, stable MSK- + impingement sign R shoulder , no evidence of frozen shoulder  good ext rotation  Ankle to 90deg (neutral)  Assessment/Plan: 1. Functional deficits which require 3+ hours per day of interdisciplinary therapy in a comprehensive inpatient rehab setting. Physiatrist is providing close team supervision and 24 hour management of active medical problems listed below. Physiatrist and rehab team continue to assess barriers to discharge/monitor patient progress toward functional and medical goals   Care Tool:  Bathing    Body parts bathed by patient: Right arm, Left arm, Chest, Abdomen, Right upper leg, Left upper leg, Face   Body parts bathed by helper: Front perineal area, Buttocks, Right lower leg, Left lower leg     Bathing assist Assist Level: Moderate Assistance - Patient 50 - 74%     Upper Body Dressing/Undressing Upper body dressing   What is the patient wearing?: Pull over shirt    Upper body assist Assist Level: 2 Helpers (sitting EOB)    Lower Body Dressing/Undressing Lower body dressing      What is the patient wearing?: Incontinence brief     Lower body assist Assist for lower body dressing: Total Assistance - Patient < 25% (bed level)     Toileting Toileting    Toileting assist Assist for toileting: Maximal Assistance - Patient 25 - 49%     Transfers Chair/bed transfer  Transfers assist     Chair/bed transfer assist level: 2 Helpers (sliding board)     Locomotion Ambulation   Ambulation assist   Ambulation activity did not occur: Safety/medical concerns (pt became emotional, did not  have time for further mobility)  Assist level: Minimal Assistance - Patient > 75% Assistive device: Walker-rolling Max distance: 50'    Walk 10 feet activity   Assist  Walk 10 feet activity did not occur: Safety/medical concerns  Assist level: Minimal Assistance - Patient > 75% Assistive device: Walker-rolling   Walk 50 feet activity   Assist Walk 50 feet with 2 turns activity did not occur: Safety/medical concerns  Assist level: Minimal Assistance - Patient > 75% Assistive device: Walker-rolling    Walk 150 feet activity   Assist Walk 150 feet activity did not occur: Safety/medical concerns         Walk 10 feet on uneven surface  activity   Assist Walk 10 feet on uneven surfaces activity did not occur: Safety/medical concerns         Wheelchair     Assist Is the patient using a wheelchair?: Yes Type of Wheelchair: Manual Wheelchair activity did not occur: Safety/medical concerns  Wheelchair assist level: Dependent - Patient 0%      Wheelchair 50 feet with 2 turns activity    Assist    Wheelchair 50 feet with 2 turns activity did not occur: Safety/medical concerns       Wheelchair 150 feet activity     Assist  Wheelchair 150 feet activity did not occur: Safety/medical concerns        Medical Problem List and Plan: 1.  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 - functional decline has been reviewed with therapies Cognitively doing well with orientation ,  2.  Antithrombotics: -DVT/anticoagulation: SCDs Pharmaceutical: Other (comment)             -antiplatelet therapy: N/A 3. Pain Management: Tylenol d/ced given hepatic failure neurogenic pain over chest and abd bilateral , has some signal abnormality in thalamus bilaterally   Baclofen 5 BID started on 9/20, changed to nightly on 9/21 due to lethargy,  d/ced on 9/23 4. Mood: Provide emotional support             -antipsychotic agents: N/A 5. Neuropsych: This patient is?  Fully capable of making decisions on her own behalf. Some emotional lability as expected given extensive brain stem lesion  6. Skin/Wound Care: Routine skin checks 7. Fluids/Electrolytes/Nutrition: Routine in and outs 8.  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  Neurology follow-up conservative care 9.  Uncontrolled diabetes mellitus with hyperglycemia.  Hemoglobin A1c 10.7.   NovoLog 12 units 3 times daily, increase to 14 units on 9/16 Semglee 20 units twice daily.  Diabetic teaching- couldn't afford insulin at home.  CBG (last 3)  Recent Labs    01/23/21 1650 01/23/21 2112 01/24/21 0610  GLUCAP 189* 160* 157*   Relatively controlled on 9/25 10.  Acute on chronic anemia.  Continue iron supplement.    Hemoglobin 11.1 on 9/19  F/u Monday 11. Constipation: Cont chronulac 75m TID Patient states lactulose not working, however having bowel movements 12. Hiccups-?  Resolved 13. Thrombocytopenia  Platelets 106 on 9/19, labs ordered for Monday  Continue to monitor 14.  Cervical stenosis  MRI showing multiple abnormalities  Appreciate neurosurgery recs, no plans for intervention at present 15.  Transaminitis  Only mild elevation of AST, labs changed to Monday 16.  Hypoalbuminemia  Supplement initiated  17.  Hyperammonemia  Ammonia 121  on 9/22  Lactulose increased on 9/23---recheck Monday 18.  Bilateral ankle heel cord tightness add PRAFO  LOS: 12 days A FACE TO FACE EVALUATION WAS PERFORMED  Charlett Blake 01/24/2021, 10:47 AM

## 2021-01-24 NOTE — Plan of Care (Signed)
  Problem: RH KNOWLEDGE DEFICIT GENERAL Goal: RH STG INCREASE KNOWLEDGE OF SELF CARE AFTER HOSPITALIZATION Description: Patient will demonstrate knowledge of medication management, pain management, diabetes management with educational materials and handouts provided by staff independently at discharge. Outcome: Not Progressing; patient too weak ; need to be fed with meals; total care

## 2021-01-25 LAB — COMPREHENSIVE METABOLIC PANEL
ALT: 65 U/L — ABNORMAL HIGH (ref 0–44)
AST: 101 U/L — ABNORMAL HIGH (ref 15–41)
Albumin: 2.2 g/dL — ABNORMAL LOW (ref 3.5–5.0)
Alkaline Phosphatase: 114 U/L (ref 38–126)
Anion gap: 7 (ref 5–15)
BUN: 18 mg/dL (ref 6–20)
CO2: 21 mmol/L — ABNORMAL LOW (ref 22–32)
Calcium: 9.2 mg/dL (ref 8.9–10.3)
Chloride: 111 mmol/L (ref 98–111)
Creatinine, Ser: 0.82 mg/dL (ref 0.44–1.00)
GFR, Estimated: >60 mL/min (ref >60)
Glucose, Bld: 166 mg/dL — ABNORMAL HIGH (ref 70–99)
Potassium: 3.8 mmol/L (ref 3.5–5.1)
Sodium: 139 mmol/L (ref 135–145)
Total Bilirubin: 4.2 mg/dL — ABNORMAL HIGH (ref 0.3–1.2)
Total Protein: 4.8 g/dL — ABNORMAL LOW (ref 6.5–8.1)

## 2021-01-25 LAB — GLUCOSE, CAPILLARY
Glucose-Capillary: 174 mg/dL — ABNORMAL HIGH (ref 70–99)
Glucose-Capillary: 170 mg/dL — ABNORMAL HIGH (ref 70–99)
Glucose-Capillary: 103 mg/dL — ABNORMAL HIGH (ref 70–99)
Glucose-Capillary: 107 mg/dL — ABNORMAL HIGH (ref 70–99)

## 2021-01-25 LAB — CBC
HCT: 29.1 % — ABNORMAL LOW (ref 36.0–46.0)
Hemoglobin: 10.6 g/dL — ABNORMAL LOW (ref 12.0–15.0)
MCH: 34.8 pg — ABNORMAL HIGH (ref 26.0–34.0)
MCHC: 36.4 g/dL — ABNORMAL HIGH (ref 30.0–36.0)
MCV: 95.4 fL (ref 80.0–100.0)
Platelets: 127 10*3/uL — ABNORMAL LOW (ref 150–400)
RBC: 3.05 MIL/uL — ABNORMAL LOW (ref 3.87–5.11)
RDW: 16.3 % — ABNORMAL HIGH (ref 11.5–15.5)
WBC: 8.7 10*3/uL (ref 4.0–10.5)
nRBC: 0 % (ref 0.0–0.2)

## 2021-01-25 LAB — AMMONIA: Ammonia: 145 umol/L — ABNORMAL HIGH (ref 9–35)

## 2021-01-25 MED ORDER — RIFAXIMIN 550 MG PO TABS
550.0000 mg | ORAL_TABLET | Freq: Two times a day (BID) | ORAL | Status: DC
  Administered 2021-01-25 – 2021-02-08 (×29): 550 mg via ORAL
  Filled 2021-01-25 (×30): qty 1

## 2021-01-25 MED ORDER — INSULIN GLARGINE-YFGN 100 UNIT/ML ~~LOC~~ SOLN
22.0000 [IU] | Freq: Two times a day (BID) | SUBCUTANEOUS | Status: DC
  Administered 2021-01-25 – 2021-01-31 (×12): 22 [IU] via SUBCUTANEOUS
  Filled 2021-01-25 (×13): qty 0.22

## 2021-01-25 NOTE — Progress Notes (Signed)
[place] PHYSICAL MEDICINE & REHABILITATION PROGRESS NOTE  Subjective/Complaints:   No new complaints. Rested comfortably.   ROS: Limited due to cognitive/behavioral    Objective: Vital Signs: Blood pressure (!) 112/45, pulse 90, temperature 99.6 F (37.6 C), temperature source Oral, resp. rate 18, height 4' 10"  (1.473 m), weight 62.2 kg, last menstrual period 12/03/2013, SpO2 99 %. No results found. Recent Labs    01/25/21 0628  WBC 8.7  HGB 10.6*  HCT 29.1*  PLT 127*    Recent Labs    01/25/21 0628  NA 139  K 3.8  CL 111  CO2 21*  GLUCOSE 166*  BUN 18  CREATININE 0.82  CALCIUM 9.2     Intake/Output Summary (Last 24 hours) at 01/25/2021 0902 Last data filed at 01/24/2021 1750 Gross per 24 hour  Intake 360 ml  Output --  Net 360 ml        Physical Exam: BP (!) 112/45 (BP Location: Right Arm)   Pulse 90   Temp 99.6 F (37.6 C) (Oral)   Resp 18   Ht 4' 10"  (1.473 m)   Wt 62.2 kg   LMP 12/03/2013 (LMP Unknown)   SpO2 99%   BMI 28.66 kg/m   Constitutional: No distress . Vital signs reviewed. HEENT: NCAT, EOMI, oral membranes moist Neck: supple Cardiovascular: RRR without murmur. No JVD    Respiratory/Chest: CTA Bilaterally without wheezes or rales. Normal effort    GI/Abdomen: BS +, non-tender, non-distended Ext: no clubbing, cyanosis, or edema Psych: flat but cooperative, delayed  Skin: No evidence of breakdown, no evidence of rash  Neuro: Alert, dysconjugate gaze. Follows basic commands. Dysarthric, delayed. Motor: RUE: 0/5 proximal distal,  RLE: 0/5 proximal distal 4+ on left side Sensation diminished to light touch right hand, stable MSK- pain right shoulder with IR. Heel cords tight   Assessment/Plan: 1. Functional deficits which require 3+ hours per day of interdisciplinary therapy in a comprehensive inpatient rehab setting. Physiatrist is providing close team supervision and 24 hour management of active medical problems listed  below. Physiatrist and rehab team continue to assess barriers to discharge/monitor patient progress toward functional and medical goals   Care Tool:  Bathing    Body parts bathed by patient: Right arm, Left arm, Chest, Abdomen, Right upper leg, Left upper leg, Face   Body parts bathed by helper: Front perineal area, Buttocks, Right lower leg, Left lower leg     Bathing assist Assist Level: Moderate Assistance - Patient 50 - 74%     Upper Body Dressing/Undressing Upper body dressing   What is the patient wearing?: Pull over shirt    Upper body assist Assist Level: 2 Helpers (sitting EOB)    Lower Body Dressing/Undressing Lower body dressing      What is the patient wearing?: Incontinence brief     Lower body assist Assist for lower body dressing: Total Assistance - Patient < 25% (bed level)     Toileting Toileting    Toileting assist Assist for toileting: Maximal Assistance - Patient 25 - 49%     Transfers Chair/bed transfer  Transfers assist     Chair/bed transfer assist level: 2 Helpers (slide board)     Locomotion Ambulation   Ambulation assist   Ambulation activity did not occur: Safety/medical concerns (pt became emotional, did not have time for further mobility)  Assist level: Minimal Assistance - Patient > 75% Assistive device: Walker-rolling Max distance: 50'   Walk 10 feet activity   Assist  Walk  10 feet activity did not occur: Safety/medical concerns  Assist level: Minimal Assistance - Patient > 75% Assistive device: Walker-rolling   Walk 50 feet activity   Assist Walk 50 feet with 2 turns activity did not occur: Safety/medical concerns  Assist level: Minimal Assistance - Patient > 75% Assistive device: Walker-rolling    Walk 150 feet activity   Assist Walk 150 feet activity did not occur: Safety/medical concerns         Walk 10 feet on uneven surface  activity   Assist Walk 10 feet on uneven surfaces activity did not  occur: Safety/medical concerns         Wheelchair     Assist Is the patient using a wheelchair?: Yes Type of Wheelchair: Manual Wheelchair activity did not occur: Safety/medical concerns  Wheelchair assist level: Dependent - Patient 0%      Wheelchair 50 feet with 2 turns activity    Assist    Wheelchair 50 feet with 2 turns activity did not occur: Safety/medical concerns       Wheelchair 150 feet activity     Assist  Wheelchair 150 feet activity did not occur: Safety/medical concerns        Medical Problem List and Plan: 1.  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  Appreciate neurology follow up. No new neuro intereventions recommended  2.  Antithrombotics: -DVT/anticoagulation: SCDs Pharmaceutical: Other (comment)             -antiplatelet therapy: N/A 3. Pain Management: Tylenol d/ced given hepatic failure neurogenic pain over chest and abd bilateral , has some signal abnormality in thalamus bilaterally   Baclofen 5 BID started on 9/20, changed to nightly on 9/21 due to lethargy, d/ced on 9/23 d/t lethargy 4. Mood: Provide emotional support             -antipsychotic agents: N/A 5. Neuropsych: This patient is?  Fully capable of making decisions on her own behalf. Some emotional lability as expected given extensive brain stem lesion  6. Skin/Wound Care: Routine skin checks 7. Fluids/Electrolytes/Nutrition:   I personally reviewed the patient's labs today.   8.  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  Neurology follow-up conservative care 9.  Uncontrolled diabetes mellitus with hyperglycemia.  Hemoglobin A1c 10.7.   NovoLog 12 units 3 times  daily, increase to 14 units on 9/16 Semglee 20 units twice daily.  Diabetic teaching- couldn't afford insulin at home.  CBG (last 3)  Recent Labs    01/24/21 1704 01/24/21 2108 01/25/21 0550  GLUCAP 199* 204* 174*  9/26 increase semglee to 22u bid 10.  Acute on chronic anemia.  Continue iron supplement.    Hemoglobin 11.1 on 9/19--> 10.6 9/26  F/u Monday 11. Constipation: Cont chronulac 23m 5x daily   12. Hiccups-?  Resolved 13. Thrombocytopenia  Platelets 106 on 9/19--> 127k 9/26  Continue to monitor 14.  Cervical stenosis  MRI showing multiple abnormalities  Appreciate neurosurgery recs, no plans for intervention at present 15.  NASH Cirrhosis    mild elevation of AST--> sl trend up 9/26 however--f/u later this week 16.  Hypoalbuminemia  Supplement initiated  17.  Hyperammonemia  Ammonia 121 on 9/22---> up to 145 9/26  -already receiving lactulose 327m5 x daily  -will ask GI  for further recs to treat 18.  Bilateral ankle heel cord tightness add PRAFO     LOS: 13 days A FACE TO FACE EVALUATION WAS PERFORMED  Meredith Staggers 01/25/2021, 9:02 AM

## 2021-01-25 NOTE — Progress Notes (Signed)
Occupational Therapy Session Note  Patient Details  Name: RAVINA MILNER MRN: [id number] Date of Birth: Apr 24, 1961  Today's Date: 01/25/2021 OT Individual Time: 0930-1040 OT Individual Time Calculation (min): 70 min    Short Term Goals: Week 2:  OT Short Term Goal 1 (Week 2): Pt will perform LB dress at sit <> stand Mod A with AE PRN OT Short Term Goal 2 (Week 2): Pt will perform toileting tasks with mod A OT Short Term Goal 3 (Week 2): Pt will complete bathing tasks with min A at shower level  Skilled Therapeutic Interventions/Progress Updates:    Pt resting in bed upon arrival. OT intervention with focus on changing clothes at bed level, bed mobility (rolling R<>L), sip n puff call bell trial, oral care, and activity tolerance. Rolling R<>L with max A+2. UB/LB dressing at bed level with tot A+2. Dependent for oral care. Pt unable to grasp toothbrush. Trialed sip n puff. Lip closure inadequate to activate device. Pt unable to activate soft call bell. RN Anderson Malta notified. Also placed pt on night bath. Pt remained in bed with all needs within reach. Bed alarm activated.   Therapy Documentation Precautions:  Precautions Precautions: Fall Precaution Comments: R sided weakness, fall risk Restrictions Weight Bearing Restrictions: No  Pain:  Pt denies pain this morning.   Therapy/Group: Individual Therapy  Leroy Libman 01/25/2021, 10:41 AM

## 2021-01-25 NOTE — Progress Notes (Signed)
Physical Therapy Session Note  Patient Details  Name: Meagan Peck MRN: [id number] Date of Birth: Mar 27, 1961  Today's Date: 01/25/2021 PT Individual Time: 1615-1650 PT Individual Time Calculation (min): 35 min   Short Term Goals: Week 2:  PT Short Term Goal 1 (Week 2): Pt will complete least restrictive transfer with min A consistently PT Short Term Goal 2 (Week 2): Pt will ambulate x 50 ft with LRAD and min A PT Short Term Goal 3 (Week 2): Pt will maintain unsupported sitting balance x 2 min with CGA PT Short Term Goal 4 (Week 2): Pt will initiate stair training  Skilled Therapeutic Interventions/Progress Updates:    Pt received seated in bed, agreeable to PT session. No complaints of pain. Rolling L/R with total A to don pants dependently. Supine to sit with total A x 2. Pt unable to maintain sitting balance without total A. Slide board transfers with total A x 2 throughout session. While seated in standard w/c pt reports inability to hold up her head, needs assist to keep head in upright position. Obtained TIS chair for improved safety and comfort with positioning in seated position. Pt falling asleep while seated in TIS chair, unable to further functionally participate in session. Pt requests to return to bed. Sit to supine total A x 2. Pt left supine in bed with husband present, bed alarm in place. Pt missed 10 min of scheduled therapy session due to fatigue.  Therapy Documentation Precautions:  Precautions Precautions: Fall Precaution Comments: R sided weakness, fall risk Restrictions Weight Bearing Restrictions: No General: PT Amount of Missed Time (min): 10 Minutes PT Missed Treatment Reason: Patient fatigue      Therapy/Group: Individual Therapy   Excell Seltzer, PT, DPT, CSRS  01/25/2021, 5:09 PM

## 2021-01-25 NOTE — Progress Notes (Signed)
Nutrition Follow-up  DOCUMENTATION CODES:   Not applicable  INTERVENTION:  Provide Ensure Max po BID, each supplement provides 150 kcal and 30 grams of protein.   Continue 30 ml Prosource plus po BID, each supplement provides 100 kcal and 15 grams of protein.   Encourage adequate PO intake.  NUTRITION DIAGNOSIS:   Increased nutrient needs related to chronic illness (NASH cirrhosis) as evidenced by estimated needs; ongoing  GOAL:   Patient will meet greater than or equal to 90% of their needs; progressing  MONITOR:   PO intake, Supplement acceptance, Skin, Weight trends, Labs, I & O's  REASON FOR ASSESSMENT:   Malnutrition Screening Tool    ASSESSMENT:   60 year old right-handed female with history significant of [name] cirrhosis with previous hepatic encephalopathy maintained on chronic Cipro, diabetes mellitus hypertension hyperlipidemia, esophageal varices/GERD. Presented 01/08/2021 altered mental status and slurred speech. MRI showed striking abnormal diffusion, T2 and flair signal abnormality symmetrically affecting the pons with sparing of the bilateral cortical infarcts favoring osmotic demyelination syndrome. 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  Meal completion has been varied from 10-100%. Most recent intake poor at 15-25%. Diet changed to a dysphagia 1 diet with thin liquids today due to functional decline. Pt currently has Prosource plus ordered and has been consuming them. RD to additionally order Ensure Max to aid in caloric and protein needs.  Labs and medications reviewed.   Diet Order:   Diet Order             DIET - DYS 1 Room service appropriate? Yes; Fluid consistency: Thin  Diet effective now                   EDUCATION NEEDS:   Not appropriate for education at this time  Skin:  Skin Assessment: Reviewed RN Assessment  Last BM:   9/25  Height:   Ht Readings from Last 1 Encounters:  01/12/21 4' 10"  (1.473 m)    Weight:   Wt Readings from Last 1 Encounters:  01/12/21 62.2 kg    BMI:  Body mass index is 28.66 kg/m.  Estimated Nutritional Needs:   Kcal:  1700-1900  Protein:  80-95 grams  Fluid:  >/= 1.7 L/day  [name], MS, RD, LDN RD pager number/after hours weekend pager number on Amion.

## 2021-01-25 NOTE — Progress Notes (Signed)
Speech Language Pathology Daily Session Note  Patient Details  Name: Meagan Peck MRN: [id number] Date of Birth: 1961-04-23  Today's Date: 01/25/2021 SLP Individual Time: 9872-1587 SLP Individual Time Calculation (min): 28 min  Short Term Goals: Week 2: SLP Short Term Goal 1 (Week 2): Patient will tolerate regular solid and thin liquid PO's in small amounts without overt s/s aspiration or penetration with supervisionA. SLP Short Term Goal 2 (Week 2): Patient will verbally express basic wants/needs with modA verbal cues to improve speech intelligibilty. SLP Short Term Goal 3 (Week 2): SLP will adjust and/or add goals if/when patient's functional status improves.  Skilled Therapeutic Interventions:Skilled ST Services focused on swallow skills. Upon entering room tray was on top of patient in bed and patient was leaning severely to left, pt appeared upset and was crying. SLP quickly reposition patient to a neutral position. Patient expressed falling over and being unable to reposition self or support self. SLP notified nursing supervisor to provide education to staff, to make sure patient is in a stable position with tray removed due to total assist feeding. SLP also communicated with primary OT pertaining to assessing functionality of "sip n puff" call system since pt is unable to use soft touch call bell, however SLP questions pt's exhale strength to activate device. OT will assess in session. SLP provided pureed textured snack, pt expressed difficulty masticating and swallowing preferred banana for breakfast. Pt was agreeable to downgrading to dys 1 textures due to functional decline. Pt was left in room with call bell within reach and bed alarm set. Recommend to continue skilled ST services.  As of note OT assessed "sip n puff" call system however pt was unable to maintain adequate labial closure. SLP will assess next session.       Pain Pain Assessment Pain Score: 0-No  pain  Therapy/Group: Individual Therapy  Matthewjames Petrasek  [facility] 01/25/2021, 1:22 PM

## 2021-01-25 NOTE — Progress Notes (Signed)
Physical Therapy Note  Patient Details  Name: Meagan Peck MRN: [id number] Date of Birth: Sep 21, 1960 Today's Date: 01/25/2021    PT attempting to see patient for make up therapy. Resting comfortably in bed. Patient declining to participate in therapy due to fatigue. She remains dysarthric/aphasic, but able to minimally communicate needs. PT offering to reposition patient/don PRAFOs. Patient declined. Bed alarm on, call light within reach.    Debbora Dus 01/25/2021, 11:38 AM

## 2021-01-26 LAB — GLUCOSE, CAPILLARY
Glucose-Capillary: 85 mg/dL (ref 70–99)
Glucose-Capillary: 145 mg/dL — ABNORMAL HIGH (ref 70–99)
Glucose-Capillary: 184 mg/dL — ABNORMAL HIGH (ref 70–99)
Glucose-Capillary: 182 mg/dL — ABNORMAL HIGH (ref 70–99)

## 2021-01-26 MED ORDER — ENSURE MAX PROTEIN PO LIQD
11.0000 [oz_av] | Freq: Two times a day (BID) | ORAL | Status: DC
  Administered 2021-01-26 – 2021-02-08 (×11): 11 [oz_av] via ORAL

## 2021-01-26 MED ORDER — SORBITOL 70 % SOLN
30.0000 mL | Freq: Once | Status: AC
  Administered 2021-01-26: 30 mL via ORAL
  Filled 2021-01-26: qty 30

## 2021-01-26 NOTE — Progress Notes (Signed)
Occupational Therapy Session Note  Patient Details  Name: Meagan Peck MRN: [id number] Date of Birth: 1960/05/27  Today's Date: 01/26/2021 OT Individual Time: 1287-8676 OT Individual Time Calculation (min): 70 min    Short Term Goals: Week 2:  OT Short Term Goal 1 (Week 2): Pt will perform LB dress at sit <> stand Mod A with AE PRN OT Short Term Goal 2 (Week 2): Pt will perform toileting tasks with mod A OT Short Term Goal 3 (Week 2): Pt will complete bathing tasks with min A at shower level  Skilled Therapeutic Interventions/Progress Updates:    Pt resting in bed upon arrival. OT intervention with focus on bed mobility, RUE PROM/AAROM, LB dressing, and activity tolerance. Rolling R/L in bed using bed rails with Tot A. Tot A for LB dressing at bed level. Pt with trace RUE/RLE activation today. Attempted use of sip n puff call bell today without success. Pt able to activate soft call bell when setup correctly. NT educated on setup and instructions placed on bulletin board. Pt able to activate soft call bell ~75* of time. Pt requested PRAFO's be placed on BLE. Pt remained in bed with bed alarm activated.    Therapy Documentation Precautions:  Precautions Precautions: Fall Precaution Comments: R sided weakness, fall risk Restrictions Weight Bearing Restrictions: No  Pain: Pain Assessment Pain Scale: 0-10 Pain Score: 0-No pain   Therapy/Group: Individual Therapy  Leroy Libman 01/26/2021, 11:06 AM

## 2021-01-26 NOTE — Plan of Care (Signed)
  Problem: RH Balance Goal: LTG Patient will maintain dynamic sitting balance (PT) Description: LTG:  Patient will maintain dynamic sitting balance with assistance during mobility activities (PT) Flowsheets (Taken 01/26/2021 1217) LTG: Pt will maintain dynamic sitting balance during mobility activities with:: (downgrade due to decline in function) Maximal Assistance - Patient 25 - 49% Note: Downgrade due to decline in function Goal: LTG Patient will maintain dynamic standing balance (PT) Description: LTG:  Patient will maintain dynamic standing balance with assistance during mobility activities (PT) Flowsheets (Taken 01/26/2021 1217) LTG: Pt will maintain dynamic standing balance during mobility activities with:: (d/c goal due to decline in function) -- Note: D/c goal due to decline in function   Problem: Sit to Stand Goal: LTG:  Patient will perform sit to stand with assistance level (PT) Description: LTG:  Patient will perform sit to stand with assistance level (PT) Flowsheets (Taken 01/26/2021 1217) LTG: PT will perform sit to stand in preparation for functional mobility with assistance level: (d/c goal due to decline in function) -- Note: D/c goal due to decline in function   Problem: RH Bed Mobility Goal: LTG Patient will perform bed mobility with assist (PT) Description: LTG: Patient will perform bed mobility with assistance, with/without cues (PT). Flowsheets (Taken 01/26/2021 1217) LTG: Pt will perform bed mobility with assistance level of: (Downgrade due to decline in function) Maximal Assistance - Patient 25 - 49% Note: Downgrade due to decline in function   Problem: RH Bed to Chair Transfers Goal: LTG Patient will perform bed/chair transfers w/assist (PT) Description: LTG: Patient will perform bed to chair transfers with assistance (PT). Flowsheets (Taken 01/26/2021 1217) LTG: Pt will perform Bed to Chair Transfers with assistance level: (Downgrade due to decline in function)  Maximal Assistance - Patient 25 - 49% Note: Downgrade due to decline in function   Problem: RH Ambulation Goal: LTG Patient will ambulate in controlled environment (PT) Description: LTG: Patient will ambulate in a controlled environment, # of feet with assistance (PT). Flowsheets (Taken 01/26/2021 1217) LTG: Pt will ambulate in controlled environ  assist needed:: (d/c goal due to decline in function) -- Note: D/c goal due to decline in function Goal: LTG Patient will ambulate in home environment (PT) Description: LTG: Patient will ambulate in home environment, # of feet with assistance (PT). Flowsheets (Taken 01/26/2021 1217) LTG: Pt will ambulate in home environ  assist needed:: (d/c goal due to decline in function) -- Note: D/c goal due to decline in function   Problem: RH Wheelchair Mobility Goal: LTG Patient will propel w/c in controlled environment (PT) Description: LTG: Patient will propel wheelchair in controlled environment, # of feet with assist (PT) Flowsheets (Taken 01/26/2021 1217) LTG: Pt will propel w/c in controlled environ  assist needed:: (Downgrade due to decline in function) Minimal Assistance - Patient > 75% LTG: Propel w/c distance in controlled environment: 50 ft Note: Downgrade due to decline in function

## 2021-01-26 NOTE — Patient Care Conference (Signed)
Inpatient RehabilitationTeam Conference and Plan of Care Update Date: 01/26/2021   Time: 11:10 AM    Patient Name: Meagan Peck      Medical Record Number: [id number]  Date of Birth: 07-[name]-1961 Sex: Female         Room/Bed: 4W21C/4W21C-01 Payor Info: Payor: /    Admit Date/Time:  01/12/2021 12:10 PM  Primary Diagnosis:  Demyelinating disease of central nervous system, unspecified [facility])  Hospital Problems: Principal Problem:   Demyelinating disease of central nervous system, unspecified ([place]) Active Problems:   Hepatic encephalopathy ([place])   Acute on chronic anemia   Uncontrolled type 2 diabetes mellitus with hyperglycemia ([place])   Spinal stenosis in cervical region   Transaminitis   Hypoalbuminemia due to protein-calorie malnutrition [facility])   Muscle tightness    Expected Discharge Date: Expected Discharge Date: 02/09/21  Team Members Present: Physician leading conference: Dr. Courtney Heys Social Worker Present: [name], [place] Nurse Present: Dorthula Nettles, RN PT Present: Excell Seltzer, PT OT Present: Roanna Epley, [place], OT SLP Present: Sherren Kerns, SLP PPS Coordinator present : Gunnar Fusi, SLP     Current Status/Progress Goal Weekly Team Focus  Bowel/Bladder   Pt is incontinent of bowel and bladder  Pt will become continent of bowel and bladder  Will assess qshift and PRN   Swallow/Nutrition/ Hydration   Downgrade to Dys 1 textures and thin liquids due to functional decline  mod I  tolerance of current diet. continued monitoring   ADL's   bathing/dressing at bed level-tot A+2/dependent; sitting balance-tot A; bed mobility-tot A+2  supervision overall; min A LB dressing; may downgrade pending prognosis  activity tolerance, bed mobility, sitting balance   Mobility   total A rolling, total A +2 supine to/from sit, total A +2 SB transfer, total A sitting balance  Supervision overall, may downgrade pending prognosis  endurance, sitting balance,  therapy tolerance   Communication   max-mod A due to decline and significant reduced breath support  mod I  EMST, speech intelligibility strategies to communicate functional needs   Safety/Cognition/ Behavioral Observations            Pain   Pt is currently pain free  Pt will remain pain free  Will assess qshift and PRN   Skin   Pt's incisions are healing  Pt's incisions will continue to heal  Will assess qshift and PRN     Discharge Planning:  Pt to d/c to home with her husband who has own health issues and can only provide supervision. Pt dtr [name] will provide physical assistance and states she will flex her schedule to make sure she can provide care to her. fam edu scheduled for 9/30 1pm-3pm with dtr.   Team Discussion: Doesn't look as well as when admitted. Consulting Palliative Care. Takes Lactulose 5x daily and still only produced small bowel movement. Gave Sorbitol today. Cognitively not as with it. Consulting pulmonary and neurology. Incontinent B/B, LBM 01/25/21. No pain reported. Skin CDI. Nursing educating on medications, B/B management. Incontinence, decline in status are current barriers. MD added prafo's due to heel cord tightness. Neurology states hypernatremia is cause of decline. Husband has history of TBI and can only provide supervision assist. Significant decline in function. Total assist +2 for bed mobility, total assist for ADL's. Added tilt-n-space W/C to aid in holding up head. Yesterday no movement in right leg or arm, slight activation today. Added soft touch call bell. Downgraded to Dys 1, thin liquids. Downgrading goals to mod/max assist.  Has reduced breath support.   Patient on target to meet rehab goals: no, patient has had a functional decline.  *See Care Plan and progress notes for long and short-term goals.   Revisions to Treatment Plan:  Downgrading goals.  Teaching Needs: Family education, medication management, transfer training, W/C training, balance  training, endurance training, B/B management, safety awareness.  Current Barriers to Discharge: Decreased caregiver support, Medical stability, Home enviroment access/layout, Incontinence, Lack of/limited family support, Weight, Weight bearing restrictions, Medication compliance, and Nutritional means  Possible Resolutions to Barriers: Continue current medications, continue timed toileting, continue to check labs, continue to work on mobility and fatigue management, provide emotional support.     Medical Summary Current Status: Couldn't do axterixis- LBM yesterday; very weak functionally; incontinent of B/B; impaired cognition;  Barriers to Discharge: Behavior;Decreased family/caregiver support;Home enviroment access/layout;Incontinence;Medical stability;Neurogenic Bowel & Bladder;Nutrition means;Insurance for SNF coverage  Barriers to Discharge Comments: family education this Friday; but concerns if can go home safely? Possible Resolutions to [employer]: significant decline in last 10 days- now total A of2; tilt and space w/c required. don't know why; no sig, movement in RUE/RLE; trace only; needs different call bell; working with SLP; 10 days ago was walking 50 ft; now D1 with thin liquids- downgraded; meds in pureed/whole; dysarthria and breath control severely impaired as well; calling Neurology about her function.  move to 10/11   Continued Need for Acute Rehabilitation Level of Care: The patient requires daily medical management by a physician with specialized training in physical medicine and rehabilitation for the following reasons: Direction of a multidisciplinary physical rehabilitation program to maximize functional independence : Yes Medical management of patient stability for increased activity during participation in an intensive rehabilitation regime.: Yes Analysis of laboratory values and/or radiology reports with any subsequent need for medication adjustment and/or  medical intervention. : Yes   I attest that I was present, lead the team conference, and concur with the assessment and plan of the team.   Cristi Loron 01/26/2021, 3:51 PM

## 2021-01-26 NOTE — Plan of Care (Signed)
  Problem: Consults Goal: RH GENERAL PATIENT EDUCATION Description: See Patient Education module for education specifics. Outcome: Progressing Goal: Diabetes Guidelines if Diabetic/Glucose > 140 Description: If diabetic or lab glucose is > 140 mg/dl - Initiate Diabetes/Hyperglycemia Guidelines & Document Interventions  Outcome: Progressing   Problem: RH SKIN INTEGRITY Goal: RH STG MAINTAIN SKIN INTEGRITY WITH ASSISTANCE Description: STG Maintain Skin Integrity With Supervision Assistance. Outcome: Progressing   Problem: RH SAFETY Goal: RH STG DECREASED RISK OF FALL WITH ASSISTANCE Description: STG Decreased Risk of Fall With A Supervision ssistance. Outcome: Progressing   Problem: RH PAIN MANAGEMENT Goal: RH STG PAIN MANAGED AT OR BELOW PT'S PAIN GOAL Description: < 3 on a 0-10 pain scale. Outcome: Progressing   Problem: RH KNOWLEDGE DEFICIT GENERAL Goal: RH STG INCREASE KNOWLEDGE OF SELF CARE AFTER HOSPITALIZATION Description: Patient will demonstrate knowledge of medication management, pain management, diabetes management with educational materials and handouts provided by staff independently at discharge. Outcome: Progressing

## 2021-01-26 NOTE — Progress Notes (Signed)
Physical Therapy Session Note  Patient Details  Name: Meagan Peck MRN: [id number] Date of Birth: 1960/10/08  Today's Date: 01/26/2021 PT Individual Time: 1400-1440 PT Individual Time Calculation (min): 40 min   Short Term Goals: Week 2:  PT Short Term Goal 1 (Week 2): Pt will complete least restrictive transfer with min A consistently PT Short Term Goal 2 (Week 2): Pt will ambulate x 50 ft with LRAD and min A PT Short Term Goal 3 (Week 2): Pt will maintain unsupported sitting balance x 2 min with CGA PT Short Term Goal 4 (Week 2): Pt will initiate stair training  Skilled Therapeutic Interventions/Progress Updates:    Pt received seated in bed, agreeable to PT session. Pt reports pain in her buttocks from laying on her back for extended period of time in bed. Supine to sit with total A x 2 for BLE management and trunk elevation. Session focus on sitting balance EOB with total A needed to maintain sitting balance. Attempt to have pt perform unsupported sitting with BLE support on ground and BUE support on RW, pt exhibits posterior and L lateral lean and unable to maintain balance. Attempt seated reaching activity with use of LUE reaching outside BOS and across midline but pt dependent for sitting balance and unable to lift LUE higher than 90 degrees and pt exhibits poor ability to grasp clothespins with LUE. Attempt to have pt perform lateral leans onto elbow and return to midline, pt total A to complete task. Pt requests to lay back down due to fatigue. Sit to supine total A x 2 for BLE management and trunk control. Pt positioned in R sidelying to decrease pressure on sacrum and buttocks and improve comfort as well as skin integrity. Adjusted TIS leg rests for improved patient fit, will assess next session. Pt left in R sidelying in bed with soft touch call button in reach, bed alarm in place. Pt missed 20 min of scheduled therapy session due to fatigue.  Therapy Documentation Precautions:   Precautions Precautions: Fall Precaution Comments: R sided weakness, fall risk Restrictions Weight Bearing Restrictions: No General: PT Amount of Missed Time (min): 20 Minutes PT Missed Treatment Reason: Patient fatigue      Therapy/Group: Individual Therapy   Excell Seltzer, PT, DPT, CSRS  01/26/2021, 2:46 PM

## 2021-01-26 NOTE — Progress Notes (Signed)
[facility] PHYSICAL MEDICINE & REHABILITATION PROGRESS NOTE  Subjective/Complaints:   Pt reports no pain- slept OK- said LBM x1 yesterday- was small.   Crying about wearing PRAFO's.    ROS: limited cognitively and behaviorally.   Objective: Vital Signs: Blood pressure (!) 114/56, pulse 73, temperature 98.6 F (37 C), temperature source Oral, resp. rate 18, height 4' 10"  (1.473 m), weight 62.2 kg, last menstrual period 12/03/2013, SpO2 98 %. No results found. Recent Labs    01/25/21 0628  WBC 8.7  HGB 10.6*  HCT 29.1*  PLT 127*    Recent Labs    01/25/21 0628  NA 139  K 3.8  CL 111  CO2 21*  GLUCOSE 166*  BUN 18  CREATININE 0.82  CALCIUM 9.2     Intake/Output Summary (Last 24 hours) at 01/26/2021 1021 Last data filed at 01/26/2021 0900 Gross per 24 hour  Intake 340 ml  Output --  Net 340 ml        Physical Exam: BP (!) 114/56 (BP Location: Left Arm)   Pulse 73   Temp 98.6 F (37 C) (Oral)   Resp 18   Ht 4' 10"  (1.473 m)   Wt 62.2 kg   LMP 12/03/2013 (LMP Unknown)   SpO2 98%   BMI 28.66 kg/m     General: awake, alert, appropriate, crying intermittently; very dysarthric; NAD HENT: conjugate gaze; oropharynx moist CV: regular rate; no JVD Pulmonary: CTA B/L; no W/R/R- good air movement GI: soft, NT, ND, (+)BS Psychiatric: crying intermittently.  Neurological: Ox1-2  Ext: no clubbing, cyanosis, or edema Psych: flat but cooperative, delayed  Skin: No evidence of breakdown, no evidence of rash  Neuro: Alert, dysconjugate gaze. Follows basic commands. Dysarthric, delayed. Motor: RUE: 0/5 proximal distal,  RLE: 0/5 proximal distal 4+ on left side Sensation diminished to light touch right hand, stable MSK- pain right shoulder with IR. Heel cords tight   Assessment/Plan: 1. Functional deficits which require 3+ hours per day of interdisciplinary therapy in a comprehensive inpatient rehab setting. Physiatrist is providing close team supervision  and 24 hour management of active medical problems listed below. Physiatrist and rehab team continue to assess barriers to discharge/monitor patient progress toward functional and medical goals   Care Tool:  Bathing    Body parts bathed by patient: Right arm, Left arm, Chest, Abdomen, Right upper leg, Left upper leg, Face   Body parts bathed by helper: Front perineal area, Buttocks, Right lower leg, Left lower leg     Bathing assist Assist Level: Moderate Assistance - Patient 50 - 74%     Upper Body Dressing/Undressing Upper body dressing   What is the patient wearing?: Pull over shirt    Upper body assist Assist Level: 2 Helpers (sitting EOB)    Lower Body Dressing/Undressing Lower body dressing      What is the patient wearing?: Incontinence brief     Lower body assist Assist for lower body dressing: Total Assistance - Patient < 25% (bed level)     Toileting Toileting    Toileting assist Assist for toileting: Maximal Assistance - Patient 25 - 49%     Transfers Chair/bed transfer  Transfers assist     Chair/bed transfer assist level: 2 Helpers (slide board)     Locomotion Ambulation   Ambulation assist   Ambulation activity did not occur: Safety/medical concerns (pt became emotional, did not have time for further mobility)  Assist level: Minimal Assistance - Patient > 75% Assistive device: Walker-rolling Max  distance: 50'   Walk 10 feet activity   Assist  Walk 10 feet activity did not occur: Safety/medical concerns  Assist level: Minimal Assistance - Patient > 75% Assistive device: Walker-rolling   Walk 50 feet activity   Assist Walk 50 feet with 2 turns activity did not occur: Safety/medical concerns  Assist level: Minimal Assistance - Patient > 75% Assistive device: Walker-rolling    Walk 150 feet activity   Assist Walk 150 feet activity did not occur: Safety/medical concerns         Walk 10 feet on uneven surface   activity   Assist Walk 10 feet on uneven surfaces activity did not occur: Safety/medical concerns         Wheelchair     Assist Is the patient using a wheelchair?: Yes Type of Wheelchair: Manual Wheelchair activity did not occur: Safety/medical concerns  Wheelchair assist level: Dependent - Patient 0%      Wheelchair 50 feet with 2 turns activity    Assist    Wheelchair 50 feet with 2 turns activity did not occur: Safety/medical concerns       Wheelchair 150 feet activity     Assist  Wheelchair 150 feet activity did not occur: Safety/medical concerns        Medical Problem List and Plan: 1.  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  Appreciate neurology follow up. No new neuro intereventions recommended -Continue CIR- PT, OT and SLP  2.  Antithrombotics: -DVT/anticoagulation: SCDs Pharmaceutical: Other (comment)             -antiplatelet therapy: N/A 3. Pain Management: Tylenol d/ced given hepatic failure neurogenic pain over chest and abd bilateral , has some signal abnormality in thalamus bilaterally   Baclofen 5 BID started on 9/20, changed to nightly on 9/21 due to lethargy, d/ced on 9/23 d/t lethargy 4. Mood: Provide emotional support             -antipsychotic agents: N/A 5. Neuropsych: This patient is?  Fully capable of making decisions on her own behalf. Some emotional lability as expected given extensive brain stem lesion   9/27- will call palliative care to discuss pt's care.  6. Skin/Wound Care: Routine skin checks 7. Fluids/Electrolytes/Nutrition:   I personally reviewed the patient's labs today.   8.  Osmotic demyelination syndrome  identified on MRI.  Neurology follow-up conservative care 9.  Uncontrolled diabetes mellitus with hyperglycemia.  Hemoglobin A1c 10.7.   NovoLog 12 units 3 times daily, increase to 14 units on 9/16 Semglee 20 units twice daily.  Diabetic teaching- couldn't afford insulin at home.  CBG (last 3)  Recent Labs    01/25/21 1801 01/25/21 2102 01/26/21 0601  GLUCAP 103* 107* 85  9/26 increase semglee to 22u bid  9/27- BG's looking better- con't regimen 10.  Acute on chronic anemia.  Continue iron supplement.    Hemoglobin 11.1 on 9/19--> 10.6 9/26  F/u Monday 11. Constipation in setting of cirrhosis: Cont chronulac 41m 5x daily  9/27- LBM yesterday small x1- will give sorbitol x1 today and con't lactulose- also on Rifaxamin.  12. Hiccups-?  Resolved 13. Thrombocytopenia  Platelets 106 on 9/19--> 127k 9/26  Continue to monitor 14.  Cervical stenosis  MRI showing multiple abnormalities  Appreciate neurosurgery  recs, no plans for intervention at present 15.  NASH Cirrhosis    mild elevation of AST--> sl trend up 9/26 however--f/u later this week 16.  Hypoalbuminemia  Supplement initiated  17.  Hyperammonemia  Ammonia 121 on 9/22---> up to 145 9/26  -already receiving lactulose 48m 5 x daily  -will ask GI for further recs to treat 18.  Bilateral ankle heel cord tightness add PRAFO   9/27- con't PRAFOs- pt appears to be getting weaker? Will d/w therapy.    I spent a total of 36 minutes on total care- >50% on coordination of care-  Calling palliative care; also d/w PA.   LOS: 14 days A FACE TO FACE EVALUATION WAS PERFORMED  Meagan Peck 01/26/2021, 10:21 AM

## 2021-01-26 NOTE — Plan of Care (Signed)
  Problem: RH Swallowing Goal: LTG Patient will consume least restrictive diet using compensatory strategies with assistance (SLP) Description: LTG:  Patient will consume least restrictive diet using compensatory strategies with assistance (SLP) Flowsheets (Taken 01/26/2021 1120) LTG: Pt Patient will consume least restrictive diet using compensatory strategies with assistance of (SLP): Maximal Assistance - Patient 25 - 49% Note: Goal downgraded due to functional decline   Problem: RH Expression Communication Goal: LTG Patient will increase speech intelligibility (SLP) Description: LTG: Patient will increase speech intelligibility at word/phrase/conversation level with cues, % of the time (SLP) Flowsheets (Taken 01/26/2021 1120) LTG: Patient will increase speech intelligibility (SLP):  Moderate Assistance - Patient 50 - 74%  Maximal Assistance - Patient 25 - 49% Note: Goal downgraded due to functional decline

## 2021-01-26 NOTE — Progress Notes (Signed)
Patient ID: Meagan Peck, female   DOB: [birth year], 60 y.o.   MRN: [id number]  SW called pt dtr [name] to provide updates from team conference, and d/c date changed from 10/4 to 10/11 due to medical concerns. Fam edu scheduled for 9/30 1pm-3pm with her dtr. SW discussed in length with daughter that patient requires a lot of physical assistance at this time, and discussed if she is able to manage care. SW will discuss in more detail to get updates on pt care needs.   SW updated [name] to inform on pt new d/c date, and to contact her dtr to conduct screening since attending indicates pt will have a disability greater than twelve months.   [name], MSW, [place] Office: [phone] Cell: [phone] Fax: [phone]

## 2021-01-27 ENCOUNTER — Inpatient Hospital Stay (HOSPITAL_COMMUNITY): Payer: Medicaid Other

## 2021-01-27 DIAGNOSIS — Z7189 Other specified counseling: Secondary | ICD-10-CM

## 2021-01-27 DIAGNOSIS — Z515 Encounter for palliative care: Secondary | ICD-10-CM

## 2021-01-27 LAB — GLUCOSE, CAPILLARY
Glucose-Capillary: 129 mg/dL — ABNORMAL HIGH (ref 70–99)
Glucose-Capillary: 149 mg/dL — ABNORMAL HIGH (ref 70–99)
Glucose-Capillary: 96 mg/dL (ref 70–99)
Glucose-Capillary: 144 mg/dL — ABNORMAL HIGH (ref 70–99)

## 2021-01-27 MED ORDER — SORBITOL 70 % SOLN
30.0000 mL | Freq: Once | Status: AC
  Administered 2021-01-27: 30 mL via ORAL
  Filled 2021-01-27: qty 30

## 2021-01-27 NOTE — Consult Note (Addendum)
Consultation Note Date: 01/27/2021   Patient Name: Meagan Peck  DOB: January 14, 1961  MRN: [id number]  Age / Sex: 60 y.o., female  PCP: Pcp, No Referring Physician: Courtney Heys, MD  Reason for Consultation: Establishing goals of care  HPI/Patient Profile: 60 y.o. female  with past medical history of [name] cirrhosis with previous hepatic encephalopathy maintained on chronic Cipro, diabetes mellitus, hypertension, hyperlipidemia, esophageal varices, and GERD admitted on 01/12/2021 to CIR following hospitalization 9/9-9/13 for AMS and slurred speech. During hospitalization, MRI showed striking abnormal diffusion, T2 and flair signal abnormality symmetrically affecting the pons with sparing of the bilateral cortical infarcts favoring osmotic demyelination syndrome. MRI cervical spine degenerative changes moderate to severe spinal stenosis C4-5 and moderate C5-6 and C6-7.  There was some mass-effect on the cord at these levels worse at C4-5 without cord signal abnormality. Patient with ongoing hyperammoniemia and receiving lactulose 5x daily. Patient not meeting goals in CIR and has shown functional decline over past week - requiring total assist for most activities. PMT consulted to discuss [place].   Clinical Assessment and Goals of Care: I have reviewed medical records including EPIC notes, labs and imaging, received report from [place], Therapist, sports, assessed the patient and then met with patient  to discuss diagnosis prognosis, [place], EOL wishes, disposition and options.  Prior to meeting with patient I attempted to call spouse to set-up time for goals of care discussion - no answer, voice mail left with call back number.   Patient was alone in her room. From what I can tell she is oriented but conversation was limited d/t very slurred speech. Also emotionally labile. Per RN, this has been consistent throughout the day.   I introduced Palliative Medicine as specialized  medical care for people living with serious illness. It focuses on providing relief from the symptoms and stress of a serious illness. The goal is to improve quality of life for both the patient and the family.  I ask her if she understands what is going on her body and she shakes her head no. We briefly and very basically review her neurological disease and liver disease. Patient becomes very tearful and crying out. Emotional support provided.   She tells me she does not know when her spouse will be at the hospital. She is able to tell me that her daughter is coming tomorrow but she does not know when - her daughter runs her own business.   I attempted to elicit values and goals of care important to the patient.  She verbalizes that what is most important to her is returning to normal.   At this point in conversation speech was too slurred and she was so upset it was difficult to continue. I asked her if we could continue conversation when she has support of her family and she was agreeable.  Patient was able to tell me she was not having any pain.  Will await call back from patient's spouse and attempt to setup [place] meeting. RN to message PMT if spouse arrives to bedside.   Primary Decision Maker NEXT OF KIN - spouse - though it seems patient can participate in discussion it seems unlikely she has capacity to make decisions for herself independently   [place]   - await call back from spouse for [place] discussion, RN to message if spouse arrives to bedside - discussion with patient limited d/t dysarthria and emotional lability  Addendum: Spouse returned call - meeting scheduled tomorrow 9/29 at 1 pm  Primary Diagnoses: Present on Admission:  Hepatic encephalopathy ([place])  Demyelinating disease of central nervous system, unspecified ([place])   I have reviewed the medical record, interviewed the patient and family, and examined the patient. The following aspects are  pertinent.  Past Medical History:  Diagnosis Date   Acute medial meniscus tear of right knee 12/10/2015   Allergy    Anasarca    Arthritis    bil feet   Ascites    Cataract    bilateral - surgery to remove   Cirrhosis (HCC)    Diabetes mellitus without complication (HCC)    type 2 - last a1c was 5.2 per patient, no meds   Dyspnea    when I have too much fluid   Esophageal varices (HCC)    GERD (gastroesophageal reflux disease)    Heart murmur    Hepatic cirrhosis (HCC)    HLD (hyperlipidemia)    no meds   Hypertension    no meds   Iron deficiency anemia 09/26/2018   Social History   Socioeconomic History   Marital status: Married    Spouse name: Haeley Fordham   Number of children: 1   Years of education: Not on file   Highest education level: Associate degree: academic program  Occupational History   Not on file  Tobacco Use   Smoking status: Never   Smokeless tobacco: Never  Vaping Use   Vaping Use: Never used  Substance and Sexual Activity   Alcohol use: No   Drug use: No   Sexual activity: Yes    Partners: Male    Birth control/protection: None, Post-menopausal  Other Topics Concern   Not on file  Social History Narrative   Not on file   Social Determinants of Health   Financial Resource Strain: Not on file  Food Insecurity: Not on file  Transportation Needs: Not on file  Physical Activity: Not on file  Stress: Not on file  Social Connections: Not on file   Family History  Problem Relation Age of Onset   Hypertension Mother    COPD Mother    Alcoholism Father    Heart attack Father    Diabetes Brother    Diabetes Paternal Grandmother    Heart disease Paternal Grandmother    Alcoholism Paternal Uncle    Alcoholism Maternal Uncle    Heart disease Maternal Grandmother    Heart disease Maternal Grandfather    Heart disease Paternal Grandfather    Scheduled Meds:  (feeding supplement) PROSource Plus  30 mL Oral BID BM   cholecalciferol   1,000 Units Oral QHS   ciprofloxacin  500 mg Oral Q2000   ferrous sulfate  325 mg Oral BID WC   insulin aspart  0-9 Units Subcutaneous TID WC   insulin aspart  14 Units Subcutaneous TID WC   insulin glargine-yfgn  22 Units Subcutaneous BID   lactulose  30 g Oral 5 X Daily   loratadine  10 mg Oral Daily   multivitamin with minerals  1 tablet Oral Daily   pantoprazole  40 mg Oral QAC breakfast   Ensure Max Protein  11 oz Oral BID   rifaximin  550 mg Oral BID   sorbitol  30 mL Oral Once   Continuous Infusions: PRN Meds:.ondansetron **OR** ondansetron (ZOFRAN) IV Allergies  Allergen Reactions   Penicillins Anaphylaxis and Other (See Comments)    Happened as a young child   Atrovent [Ipratropium] Palpitations   Naproxen Rash   Quinine Derivatives  Rash   Sulfa Antibiotics Rash   Review of Systems  Unable to perform ROS: Other  Denies pain  Physical Exam Constitutional:      General: She is not in acute distress. Pulmonary:     Effort: Pulmonary effort is normal.  Skin:    General: Skin is warm and dry.  Neurological:     Mental Status: She is alert and oriented to person, place, and time.  Psychiatric:        Mood and Affect: Affect is tearful.    Vital Signs: BP (!) 129/55 (BP Location: Right Arm)   Pulse 88   Temp 98.2 F (36.8 C) (Oral)   Resp 18   Ht 4' 10"  (1.473 m)   Wt 59.7 kg   LMP 12/03/2013 (LMP Unknown)   SpO2 97%   BMI 27.51 kg/m  Pain Scale: 0-10   Pain Score: 0-No pain   SpO2: SpO2: 97 % O2 Device:SpO2: 97 % O2 Flow Rate: .   IO: Intake/output summary:  Intake/Output Summary (Last 24 hours) at 01/27/2021 1327 Last data filed at 01/27/2021 6754 Gross per 24 hour  Intake 120 ml  Output --  Net 120 ml    LBM: Last BM Date: 01/26/21 Baseline Weight: Weight: 62.2 kg Most recent weight: Weight: 59.7 kg     Palliative Assessment/Data: PPS 30%     Time Total: 40 minutes Greater than 50%  of this time was spent counseling and coordinating  care related to the above assessment and plan.  Juel Burrow, DNP, AGNP-C Palliative Medicine Team [phone] Pager: [phone]

## 2021-01-27 NOTE — Progress Notes (Signed)
Physical Therapy Weekly Progress Note  Patient Details  Name: Meagan Peck MRN: [id number] Date of Birth: Jun 24, 1960  Beginning of progress report period: January 20, 2021 End of progress report period: January 27, 2021  Today's Date: 01/27/2021  Patient has met 0 of 4 short term goals.  Patient has continued to show a functional decline over the past week. She now currently requires total A for rolling in bed, total A x 2 for supine to/from sit, total A x 2 for slide board transfers, and is unable to support herself in sitting position in a standard wheelchair and requires a TIS chair for trunk and head support. Medical team is aware and attempting to determine cause of significant decline in function. Pt also exhibits decreased tolerance and endurance for therapy sessions and has been reduced to a 15/7 therapy schedule.  Patient continues to demonstrate the following deficits muscle weakness, decreased cardiorespiratoy endurance, abnormal tone, unbalanced muscle activation, and decreased coordination, and decreased sitting balance, decreased postural control, and decreased balance strategies and therefore will continue to benefit from skilled PT intervention to increase functional independence with mobility.  Patient not progressing toward long term goals.  See goal revision..  Plan of care revisions: downgraded goals to max A overall and d/c gait goals due to decline in function over the past week.  PT Short Term Goals Week 2:  PT Short Term Goal 1 (Week 2): Pt will complete least restrictive transfer with min A consistently PT Short Term Goal 1 - Progress (Week 2): Not met PT Short Term Goal 2 (Week 2): Pt will ambulate x 50 ft with LRAD and min A PT Short Term Goal 2 - Progress (Week 2): Not met PT Short Term Goal 3 (Week 2): Pt will maintain unsupported sitting balance x 2 min with CGA PT Short Term Goal 3 - Progress (Week 2): Not met PT Short Term Goal 4 (Week 2): Pt will initiate  stair training PT Short Term Goal 4 - Progress (Week 2): Not met Week 3:  PT Short Term Goal 1 (Week 3): Pt will perform supine to/from sit with assist x 1 consistently PT Short Term Goal 2 (Week 3): Pt will perform least restrictive transfer with assist x 1 consistently PT Short Term Goal 3 (Week 3): Pt will tolerate sitting OOB in chair x 1 hour  Therapy Documentation Precautions:  Precautions Precautions: Fall Precaution Comments: R sided weakness, fall risk Restrictions Weight Bearing Restrictions: No   Therapy/Group: Individual Therapy   Excell Seltzer, PT, DPT, CSRS 01/27/2021, 7:49 AM

## 2021-01-27 NOTE — Progress Notes (Signed)
Occupational Therapy Session Note  Patient Details  Name: Meagan Peck MRN: [id number] Date of Birth: [birth year]  Today's Date: 01/27/2021 OT Individual Time: 0930-1040 OT Individual Time Calculation (min): 70 min    Short Term Goals: Week 2:  OT Short Term Goal 1 (Week 2): Pt will perform LB dress at sit <> stand Mod A with AE PRN OT Short Term Goal 2 (Week 2): Pt will perform toileting tasks with mod A OT Short Term Goal 3 (Week 2): Pt will complete bathing tasks with min A at shower level  Skilled Therapeutic Interventions/Progress Updates:    Pt resting in bed upon arrival. Pt agreeable to changing clothing but declined sitting in w/c because it is uncomfortable. Pt noted with increased Rt knee flexion when supine in bed. Rolling R/L in bed with mod A+2. Tot A for UB and LB dressing. HOB elevated for donning shirt. Pt tot A+2 to sit with 90* trunk flexion to pull shirt down in back. Pt dependent for oral care. Soft call bell placed so pt able activate. RUE PROM with pt report that she can feel a stretch in her triceps. No reported pain at joints. Pt remained in bed with bed alarm activated. OT intervention with focus on bed mobility, dressing at bed level, RUE AAROM/PROM, and activity tolerance.   Therapy Documentation Precautions:  Precautions Precautions: Fall Precaution Comments: R sided weakness, fall risk Restrictions Weight Bearing Restrictions: No General:   Vital Signs:  Pain: Pt denies pain this morning but grimaces with RUE PROM/stretching; repositioned   Therapy/Group: Individual Therapy  Leroy Libman 01/27/2021, 10:44 AM

## 2021-01-27 NOTE — Progress Notes (Signed)
Speech Language Pathology Daily Session Note  Patient Details  Name: Meagan Peck MRN: [id number] Date of Birth: [birth year]  Today's Date: 01/27/2021 SLP Individual Time: 1445-1515 SLP Individual Time Calculation (min): 30 min  Short Term Goals: Week 2: SLP Short Term Goal 1 (Week 2): Patient will tolerate regular solid and thin liquid PO's in small amounts without overt s/s aspiration or penetration with supervisionA. SLP Short Term Goal 2 (Week 2): Patient will verbally express basic wants/needs with modA verbal cues to improve speech intelligibilty. SLP Short Term Goal 3 (Week 2): SLP will adjust and/or add goals if/when patient's functional status improves.  Skilled Therapeutic Interventions:   Patient seen for skilled ST session focusing on speech goals. She was in bed when SLP arrived, and stated she felt "miserable". She was able to communicate that her stomach was cramped from being constipated. Speech continues to be severely dysarthric with SLP only able to understand patient at single word level and with mod-max A cues for repeating. Patient is understandably frustrated. She was able to demonstrate use of soft call bell however this is difficult secondary to her significant weakness. She had smartphone in bed with her but when SLP attempted to help her setup Siri on her iPhone, she was not able to speak clearly enough for voice commands to work. Patient left in bed with all needs in reach. She continues to benefit from skilled SLP intervention to maximize speech and swallow function prior to discharge.  Pain Pain Assessment Pain Scale: Faces Faces Pain Scale: Hurts even more Pain Type: Acute pain Pain Location: Abdomen Pain Descriptors / Indicators: Aching Pain Onset: On-going Pain Intervention(s): Other (Comment) (RN already aware)  Therapy/Group: Individual Therapy  Sonia Baller, MA, CCC-SLP Speech Therapy

## 2021-01-27 NOTE — Plan of Care (Signed)
  Problem: RH Balance Goal: LTG: Patient will maintain dynamic sitting balance (OT) Description: LTG:  Patient will maintain dynamic sitting balance with assistance during activities of daily living (OT) Flowsheets (Taken 01/27/2021 0842) LTG: Pt will maintain dynamic sitting balance during ADLs with: Minimal Assistance - Patient > 75%  GOAL ADDED

## 2021-01-27 NOTE — Progress Notes (Signed)
Occupational Therapy Weekly Progress Note  Patient Details  Name: Meagan Peck MRN: [id number] Date of Birth: [birth year]  Beginning of progress report period: January 19, 2021 End of progress report period: January 27, 2021  Patient has met 0 of 3 short term goals.  Pt overall function has decreased over the past week secondary to a change in medical status. Pt is dependent/tot A for all BADLs and bed mobility. RUE MMT 0/5 now. LTG modified to max A overall. FAmily education is scheduled for 9/30. Pt's goals were all downgraded or discontinued to better reflect her ability to met some functional goals. Anticipate that pt will perform ADLs at bed level as well as toileting. At this point pt is using a slide board to transfer with total A. Pt also now requires max A to maintain sitting balance.   Patient continues to demonstrate the following deficits: muscle weakness and muscle joint tightness, decreased cardiorespiratoy endurance, impaired timing and sequencing, abnormal tone, unbalanced muscle activation, decreased coordination, and decreased motor planning, decreased visual acuity and decreased visual motor skills, decreased midline orientation, and decreased sitting balance, decreased standing balance, decreased postural control, decreased balance strategies, and difficulty maintaining precautions and therefore will continue to benefit from skilled OT intervention to enhance overall performance with BADL and Reduce care partner burden.  Patient not progressing toward long term goals.  See goal revision..  Continue plan of care. OT Short Term Goals Week 2:  OT Short Term Goal 1 (Week 2): Pt will perform LB dress at sit <> stand Mod A with AE PRN OT Short Term Goal 1 - Progress (Week 2): Revised due to lack of progress OT Short Term Goal 2 (Week 2): Pt will perform toileting tasks with mod A OT Short Term Goal 2 - Progress (Week 2): Revised due to lack of progress OT Short Term Goal 3 (Week  2): Pt will complete bathing tasks with min A at shower level Week 3:  OT Short Term Goal 1 (Week 3): Pt will roll R/L in bed with max A to facilitate dressing/bathing tasks at bed level OT Short Term Goal 2 (Week 3): Pt will perform self feeding with max A OT Short Term Goal 3 (Week 3): Pt will maintan sitting balance with max A+1 in preparation for BADLs and transfers    Leroy Libman 01/27/2021, 12:10 PM

## 2021-01-27 NOTE — Plan of Care (Signed)
Problem: RH Balance Goal: LTG Patient will maintain dynamic standing with ADLs (OT) Description: LTG:  Patient will maintain dynamic standing balance with assist during activities of daily living (OT)  Outcome: Not Applicable Flowsheets (Taken 01/27/2021 0836) LTG: Pt will maintain dynamic standing balance during ADLs with: (d/c this goal due to decline in progress) --   Problem: Sit to Stand Goal: LTG:  Patient will perform sit to stand in prep for activites of daily living with assistance level (OT) Description: LTG:  Patient will perform sit to stand in prep for activites of daily living with assistance level (OT) Outcome: Not Applicable Flowsheets (Taken 01/27/2021 0836) LTG: PT will perform sit to stand in prep for activites of daily living with assistance level: (d/c this goal due to decline in progress) -- Note: d/c this goal due to decline in progress   Problem: RH Eating Goal: LTG Patient will perform eating w/assist, cues/equip (OT) Description: LTG: Patient will perform eating with assist, with/without cues using equipment (OT) Flowsheets (Taken 01/27/2021 0836) LTG: Pt will perform eating with assistance level of: (downgraded goal due to decline in progress) Moderate Assistance - Patient 50 - 74% Note: downgraded goal due to decline in progress   Problem: RH Grooming Goal: LTG Patient will perform grooming w/assist,cues/equip (OT) Description: LTG: Patient will perform grooming with assist, with/without cues using equipment (OT) Outcome: Not Applicable Flowsheets (Taken 01/27/2021 0836) LTG: Pt will perform grooming with assistance level of: (d/c goal at this time) -- Note: D/c goal at this    Problem: RH Bathing Goal: LTG Patient will bathe all body parts with assist levels (OT) Description: LTG: Patient will bathe all body parts with assist levels (OT) Outcome: Not Applicable Flowsheets (Taken 01/27/2021 0836) LTG: Pt will perform bathing with assistance level/cueing:  (d/c goal due to decline in progress) -- Note: D/c goal due to decline in progress   Problem: RH Dressing Goal: LTG Patient will perform upper body dressing (OT) Description: LTG Patient will perform upper body dressing with assist, with/without cues (OT). Flowsheets (Taken 01/27/2021 0836) LTG: Pt will perform upper body dressing with assistance level of: (downgraded goal due to decline in progress) Maximal Assistance - Patient 25 - 49% Note: downgraded goal due to decline in progress Goal: LTG Patient will perform lower body dressing w/assist (OT) Description: LTG: Patient will perform lower body dressing with assist, with/without cues in positioning using equipment (OT) Outcome: Not Applicable Flowsheets (Taken 01/27/2021 0836) LTG: Pt will perform lower body dressing with assistance level of: (d/c goal due to decline in progress) -- Note: D/c goal due to decline in progress   Problem: RH Toileting Goal: LTG Patient will perform toileting task (3/3 steps) with assistance level (OT) Description: LTG: Patient will perform toileting task (3/3 steps) with assistance level (OT)  Outcome: Not Applicable Flowsheets (Taken 01/27/2021 0836) LTG: Pt will perform toileting task (3/3 steps) with assistance level: (d/c goal due to decline in progress) -- Note: D/c goal due to decline in progress   Problem: RH Functional Use of Upper Extremity Goal: LTG Patient will use RT/LT upper extremity as a (OT) Description: LTG: Patient will use right/left upper extremity as a stabilizer/gross assist/diminished/nondominant/dominant level with assist, with/without cues during functional activity (OT) Flowsheets (Taken 01/27/2021 0836) LTG: Use of upper extremity in functional activities: LUE as gross assist level LTG: Pt will use upper extremity in functional activity with assistance level of: (downgraded goal due to decline in progress) Moderate Assistance - Patient 50 - 74% Note: downgraded  goal due to decline  in progress   Problem: RH Toilet Transfers Goal: LTG Patient will perform toilet transfers w/assist (OT) Description: LTG: Patient will perform toilet transfers with assist, with/without cues using equipment (OT) Flowsheets (Taken 01/27/2021 0836) LTG: Pt will perform toilet transfers with assistance level of: (downgraded goal due to decline in progress) Maximal Assistance - Patient 25 - 49% Note: downgraded goal due to decline in progress   Problem: RH Tub/Shower Transfers Goal: LTG Patient will perform tub/shower transfers w/assist (OT) Description: LTG: Patient will perform tub/shower transfers with assist, with/without cues using equipment (OT) Outcome: Not Applicable Flowsheets (Taken 01/27/2021 0836) LTG: Pt will perform tub/shower stall transfers with assistance level of: (d/c goal due to decline in progress) -- Note: D/c goal due to decline in progress

## 2021-01-27 NOTE — Progress Notes (Signed)
[place] PHYSICAL MEDICINE & REHABILITATION PROGRESS NOTE  Subjective/Complaints:   Pt reports not really having BM's - smear yesterday AM, but no BM with Sorbitol yesterday or Lactulose 5x/day.   No significant change in strength.     ROS: limited by cognition and behavior  Objective: Vital Signs: Blood pressure (!) 129/55, pulse 88, temperature 98.2 F (36.8 C), temperature source Oral, resp. rate 18, height 4' 10"  (1.473 m), weight 59.7 kg, last menstrual period 12/03/2013, SpO2 97 %. No results found. Recent Labs    01/25/21 0628  WBC 8.7  HGB 10.6*  HCT 29.1*  PLT 127*    Recent Labs    01/25/21 0628  NA 139  K 3.8  CL 111  CO2 21*  GLUCOSE 166*  BUN 18  CREATININE 0.82  CALCIUM 9.2     Intake/Output Summary (Last 24 hours) at 01/27/2021 0739 Last data filed at 01/26/2021 1245 Gross per 24 hour  Intake 180 ml  Output --  Net 180 ml        Physical Exam: BP (!) 129/55 (BP Location: Right Arm)   Pulse 88   Temp 98.2 F (36.8 C) (Oral)   Resp 18   Ht 4' 10"  (1.473 m)   Wt 59.7 kg   LMP 12/03/2013 (LMP Unknown)   SpO2 97%   BMI 27.51 kg/m      General: awake, alert, appropriate, dysarthric; supine;  NAD HENT: conjugate gaze; oropharynx moist CV: regular rate; no JVD Pulmonary: CTA B/L; no W/R/R- good air movement GI: soft, NT, ND, (+)BS- normoactive Psychiatric: appropriate; vague- hard to focus on topic.  Neurological: alert; dysarthric;   Ext: no clubbing, cyanosis, or edema Psych: flat but cooperative, delayed  Skin: No evidence of breakdown, no evidence of rash  Neuro: Alert, dysconjugate gaze. Follows basic commands. Dysarthric, delayed. Motor: RUE: 0/5 proximal distal,  RLE: 0/5 proximal distal ~ 3+/5 on L side Sensation diminished to light touch right hand, stable MSK- pain right shoulder with IR. Heel cords tight   Assessment/Plan: 1. Functional deficits which require 3+ hours per day of interdisciplinary therapy in a  comprehensive inpatient rehab setting. Physiatrist is providing close team supervision and 24 hour management of active medical problems listed below. Physiatrist and rehab team continue to assess barriers to discharge/monitor patient progress toward functional and medical goals   Care Tool:  Bathing    Body parts bathed by patient: Right arm, Left arm, Chest, Abdomen, Right upper leg, Left upper leg, Face   Body parts bathed by helper: Front perineal area, Buttocks, Right lower leg, Left lower leg     Bathing assist Assist Level: Moderate Assistance - Patient 50 - 74%     Upper Body Dressing/Undressing Upper body dressing   What is the patient wearing?: Pull over shirt    Upper body assist Assist Level: 2 Helpers (sitting EOB)    Lower Body Dressing/Undressing Lower body dressing      What is the patient wearing?: Incontinence brief     Lower body assist Assist for lower body dressing: Total Assistance - Patient < 25% (bed level)     Toileting Toileting    Toileting assist Assist for toileting: Maximal Assistance - Patient 25 - 49%     Transfers Chair/bed transfer  Transfers assist     Chair/bed transfer assist level: 2 Helpers (slide board)     Locomotion Ambulation   Ambulation assist   Ambulation activity did not occur: Safety/medical concerns (pt became emotional, did not  have time for further mobility)  Assist level: Minimal Assistance - Patient > 75% Assistive device: Walker-rolling Max distance: 50'   Walk 10 feet activity   Assist  Walk 10 feet activity did not occur: Safety/medical concerns  Assist level: Minimal Assistance - Patient > 75% Assistive device: Walker-rolling   Walk 50 feet activity   Assist Walk 50 feet with 2 turns activity did not occur: Safety/medical concerns  Assist level: Minimal Assistance - Patient > 75% Assistive device: Walker-rolling    Walk 150 feet activity   Assist Walk 150 feet activity did not  occur: Safety/medical concerns         Walk 10 feet on uneven surface  activity   Assist Walk 10 feet on uneven surfaces activity did not occur: Safety/medical concerns         Wheelchair     Assist Is the patient using a wheelchair?: Yes Type of Wheelchair: Manual Wheelchair activity did not occur: Safety/medical concerns  Wheelchair assist level: Dependent - Patient 0%      Wheelchair 50 feet with 2 turns activity    Assist    Wheelchair 50 feet with 2 turns activity did not occur: Safety/medical concerns       Wheelchair 150 feet activity     Assist  Wheelchair 150 feet activity did not occur: Safety/medical concerns        Medical Problem List and Plan: 1.  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  Appreciate neurology follow up. No new neuro intereventions recommended -Continue CIR- PT, OT and SLP -called Neuro and they feel hyperammoniemia is cause of getting weaker- trying to fix this.  2.  Antithrombotics: -DVT/anticoagulation: SCDs 9/28- Plts 127- con't  to monitor  Pharmaceutical: Other (comment)             -antiplatelet therapy: N/A 3. Pain Management: Tylenol d/ced given hepatic failure neurogenic pain over chest and abd bilateral , has some signal abnormality in thalamus bilaterally   Baclofen 5 BID started on 9/20, changed to nightly on 9/21 due to lethargy, d/ced on 9/23 d/t lethargy 4. Mood: Provide emotional support             -antipsychotic agents: N/A 5. Neuropsych: This patient is not  Fully capable of making decisions on her own behalf. Some emotional lability as expected given extensive brain stem lesion   9/27- will call  palliative care to discuss pt's care. 9/28- consulted palliative care- spoke to them- wants ot determine goals of care.   6. Skin/Wound Care: Routine skin checks 7. Fluids/Electrolytes/Nutrition:   I personally reviewed the patient's labs today.   8.  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  Neurology follow-up conservative care 9.  Uncontrolled diabetes mellitus with hyperglycemia.  Hemoglobin A1c 10.7.   NovoLog 12 units 3 times daily, increase to 14 units on 9/16 Semglee 20 units twice daily.  Diabetic teaching- couldn't afford insulin at home.  CBG (last 3)  Recent Labs    01/26/21 1133 01/26/21 1639 01/26/21 2139  GLUCAP 145* 184* 182*  9/26 increase semglee to 22u bid  9/28- BG's stable- con't regimen 10.  Acute on chronic anemia.  Continue iron supplement.    Hemoglobin 11.1 on 9/19--> 10.6 9/26  F/u Monday 11. Constipation in  setting of cirrhosis: Cont chronulac 66m 5x daily  9/27- LBM yesterday small x1- will give sorbitol x1 today and con't lactulose- also on Rifaxamin.  12. Hiccups-?  Resolved 13. Thrombocytopenia  Platelets 106 on 9/19--> 127k 9/26  Continue to monitor 14.  Cervical stenosis  MRI showing multiple abnormalities  Appreciate neurosurgery recs, no plans for intervention at present 15.  NASH Cirrhosis    mild elevation of AST--> sl trend up 9/26 however--f/u later this week 16.  Hypoalbuminemia  Supplement initiated  17.  Hyperammonemia  Ammonia 121 on 9/22---> up to 145 9/26  -already receiving lactulose 373m5 x daily  -will ask GI for further recs to treat  9/28- will check KUB as to why cannot get her to poop- and get cleaned out.  18.  Bilateral ankle heel cord tightness add PRAFO   9/27- con't PRAFOs- pt appears to be getting weaker? Will d/w therapy.     LOS: 15 days A FACE TO FACE EVALUATION WAS PERFORMED  Zaydan Papesh 01/27/2021, 7:39 AM

## 2021-01-27 NOTE — Progress Notes (Signed)
Physical Therapy Session Note  Patient Details  Name: Meagan Peck MRN: [id number] Date of Birth: 18-Jun-1960  Today's Date: 01/27/2021 PT Individual Time: 0910-0930 PT Individual Time Calculation (min): 20 min   Short Term Goals: Week 1:  PT Short Term Goal 1 (Week 1): Pt will initiate gait training PT Short Term Goal 1 - Progress (Week 1): Met PT Short Term Goal 2 (Week 1): Pt will transfer with min A or better consistently PT Short Term Goal 2 - Progress (Week 1): Not met PT Short Term Goal 3 (Week 1): Pt will maintain sitting balance without UE suppoty x 2 min PT Short Term Goal 3 - Progress (Week 1): Not met Week 2:  PT Short Term Goal 1 (Week 2): Pt will complete least restrictive transfer with min A consistently PT Short Term Goal 1 - Progress (Week 2): Not met PT Short Term Goal 2 (Week 2): Pt will ambulate x 50 ft with LRAD and min A PT Short Term Goal 2 - Progress (Week 2): Not met PT Short Term Goal 3 (Week 2): Pt will maintain unsupported sitting balance x 2 min with CGA PT Short Term Goal 3 - Progress (Week 2): Not met PT Short Term Goal 4 (Week 2): Pt will initiate stair training PT Short Term Goal 4 - Progress (Week 2): Not met  Skilled Therapeutic Interventions/Progress Updates:    pt received in bed and agreeable to therapy. No complaint of pain. Noted jaundiced appearance of both eyes. Supine<>sit with tot A, though pt was able to move BLE toward EOB with verbal encouragement. Tot A for sitting balance at EOB for several minutes. Pt was unable to maintain upright sitting with support of LUE after attempting several positions. Pt returned to bed and therapist performed DF stretch with AP talar glide at BIL ankles. Pt directed in several reps of active dorsiflexion but fatigued quickly. Pt had strong dorsiflexion response during stretch, unsure if hyperreflexia of anterior tib d/t pressure of tendons or active muscle response. Pt remained in bed after session and was left  with all needs in reach and alarm active.   Therapy Documentation Precautions:  Precautions Precautions: Fall Precaution Comments: R sided weakness, fall risk Restrictions Weight Bearing Restrictions: No General: PT Amount of Missed Time (min): 10 Minutes PT Missed Treatment Reason: Patient fatigue   Therapy/Group: Individual Therapy  Mickel Fuchs 01/27/2021, 5:27 PM

## 2021-01-28 DIAGNOSIS — Z66 Do not resuscitate: Secondary | ICD-10-CM

## 2021-01-28 LAB — GLUCOSE, CAPILLARY
Glucose-Capillary: 123 mg/dL — ABNORMAL HIGH (ref 70–99)
Glucose-Capillary: 159 mg/dL — ABNORMAL HIGH (ref 70–99)
Glucose-Capillary: 149 mg/dL — ABNORMAL HIGH (ref 70–99)
Glucose-Capillary: 115 mg/dL — ABNORMAL HIGH (ref 70–99)

## 2021-01-28 LAB — AMMONIA: Ammonia: 113 umol/L — ABNORMAL HIGH (ref 9–35)

## 2021-01-28 LAB — MAGNESIUM: Magnesium: 1.5 mg/dL — ABNORMAL LOW (ref 1.7–2.4)

## 2021-01-28 MED ORDER — SORBITOL 70 % SOLN
60.0000 mL | Freq: Once | Status: AC
  Administered 2021-01-28: 60 mL via ORAL

## 2021-01-28 MED ORDER — FLEET ENEMA 7-19 GM/118ML RE ENEM
1.0000 | ENEMA | Freq: Once | RECTAL | Status: AC
  Administered 2021-01-28: 1 via RECTAL
  Filled 2021-01-28: qty 1

## 2021-01-28 MED ORDER — SODIUM CHLORIDE 0.9 % IV SOLN: INTRAVENOUS | Status: DC

## 2021-01-28 MED ORDER — MAGNESIUM OXIDE -MG SUPPLEMENT 400 (240 MG) MG PO TABS
800.0000 mg | ORAL_TABLET | Freq: Two times a day (BID) | ORAL | Status: DC
  Administered 2021-01-28 – 2021-02-08 (×23): 800 mg via ORAL
  Filled 2021-01-28 (×24): qty 2

## 2021-01-28 NOTE — Progress Notes (Signed)
Physical Therapy Session Note  Patient Details  Name: Meagan Peck MRN: [id number] Date of Birth: February 23, 1961  Today's Date: 01/28/2021   Skilled Therapeutic Interventions/Progress Updates: Pt unable to participate in skilled PT session due to nsg in room administering enema. Will continue efforts as schedule permits. Missed 30 min skilled PT due to nsg services.      Therapy Documentation Precautions:  Precautions Precautions: Fall Precaution Comments: R sided weakness, fall risk Restrictions Weight Bearing Restrictions: No General: PT Amount of Missed Time (min): 30 Minutes PT Missed Treatment Reason: Nursing care  Therapy/Group: Individual Therapy  Meagan Peck, PTA  01/28/2021, 3:46 PM

## 2021-01-28 NOTE — Progress Notes (Signed)
Daily Progress Note   Patient Name: Meagan Peck       Date: 01/28/2021 DOB: 19-Jul-1960  Age: 60 y.o. MRN#: [id number] Attending Physician: Courtney Heys, MD Primary Care Physician: Pcp, No Admit Date: 01/12/2021  Reason for Consultation/Follow-up: Establishing goals of care  Subjective: Tells me she has still not had a good BM but feels better than yesterday  Length of Stay: 16  Current Medications: Scheduled Meds:   (feeding supplement) PROSource Plus  30 mL Oral BID BM   cholecalciferol  1,000 Units Oral QHS   ciprofloxacin  500 mg Oral Q2000   ferrous sulfate  325 mg Oral BID WC   insulin aspart  0-9 Units Subcutaneous TID WC   insulin aspart  14 Units Subcutaneous TID WC   insulin glargine-yfgn  22 Units Subcutaneous BID   lactulose  30 g Oral 5 X Daily   loratadine  10 mg Oral Daily   magnesium oxide  800 mg Oral BID   multivitamin with minerals  1 tablet Oral Daily   pantoprazole  40 mg Oral QAC breakfast   Ensure Max Protein  11 oz Oral BID   rifaximin  550 mg Oral BID   sodium phosphate  1 enema Rectal Once    Continuous Infusions:  sodium chloride      PRN Meds: ondansetron **OR** ondansetron (ZOFRAN) IV  Physical Exam Constitutional:      General: She is not in acute distress. Pulmonary:     Effort: Pulmonary effort is normal.  Skin:    General: Skin is warm and dry.  Neurological:     Mental Status: She is alert and oriented to person, place, and time.  Psychiatric:        Mood and Affect: Affect is tearful.            Vital Signs: BP (!) 131/53 (BP Location: Right Arm)   Pulse 83   Temp 97.8 F (36.6 C)   Resp 14   Ht 4' 10"  (1.473 m)   Wt 59.7 kg   LMP 12/03/2013 (LMP Unknown)   SpO2 99%   BMI 27.51 kg/m  SpO2: SpO2: 99 % O2 Device: O2 Device: Room  Air O2 Flow Rate:    Intake/output summary:  Intake/Output Summary (Last 24 hours) at 01/28/2021 1242 Last data filed at 01/28/2021 0846 Gross per 24 hour  Intake 480 ml  Output 1 ml  Net 479 ml   LBM: Last BM Date: 01/27/21 Baseline Weight: Weight: 62.2 kg Most recent weight: Weight: 59.7 kg       Palliative Assessment/Data: PPS 30%      Patient Active Problem List   Diagnosis Date Noted   Muscle tightness    Transaminitis    Hypoalbuminemia due to protein-calorie malnutrition ([place])    Spinal stenosis in cervical region    Acute on chronic anemia    Uncontrolled type 2 diabetes mellitus with hyperglycemia (HCC)    Demyelinating disease of central nervous system, unspecified ([place]) 01/12/2021   Hepatic encephalopathy ([place]) 01/08/2021   Liver cirrhosis secondary to NASH ([place]) 01/27/2020   Abnormal weight gain 01/27/2020   Anasarca 01/27/2020   Electrolyte disturbance 01/27/2020   Hospital discharge follow-up  01/27/2020   NASH (nonalcoholic steatohepatitis)    Portal hypertension ([place])    Angiodysplasia of upper gastrointestinal tract    SBP (spontaneous bacterial peritonitis) ([place]) 01/08/2020   Esophageal varices without bleeding ([place])    Hypertension associated with diabetes ([place]) 08/16/2019   Chest pain 11/27/2017   Vitamin D deficiency 05/15/2017   Iron deficiency 05/15/2017   Breast screening declined 05/15/2017   Colonoscopy refused 05/15/2017   Choroidal nevus of both eyes 07/18/2016   Corneal epithelial and basement membrane dystrophy 07/18/2016   Thrombocytopenia ([place]) 04/14/2016   Hyperlipidemia associated with type 2 diabetes mellitus ([place]) 05/25/2015   Gastroesophageal reflux disease 05/25/2015   Type 2 diabetes mellitus with other specified complication ([place]) 40/98/1191    Palliative Care Assessment & Plan   HPI: 60 y.o. female  with past medical history of Meagan Peck cirrhosis with previous hepatic encephalopathy maintained on chronic Cipro, diabetes  mellitus, hypertension, hyperlipidemia, esophageal varices, and GERD admitted on 01/12/2021 to CIR following hospitalization 9/9-9/13 for AMS and slurred speech. During hospitalization, MRI showed striking abnormal diffusion, T2 and flair signal abnormality symmetrically affecting the pons with sparing of the bilateral cortical infarcts favoring osmotic demyelination syndrome. MRI cervical spine degenerative changes moderate to severe spinal stenosis C4-5 and moderate C5-6 and C6-7.  There was some mass-effect on the cord at these levels worse at C4-5 without cord signal abnormality. Patient with ongoing hyperammoniemia and receiving lactulose 5x daily. Patient not meeting goals in CIR and has shown functional decline over past week - requiring total assist for most activities. PMT consulted to discuss [place].   Assessment: Scheduled meeting at bedside with patient and spouse.   Spouse shares about patient's decline prior to admission - tells me she spent most of her time in bed. He tells me this is d/t her liver disease. Tells me she had poor appetite - mostly ate jello. Patient confirms.   We discussed patient's current illness and what it means in the larger context of patient's on-going co-morbidities.  Natural disease trajectory and expectations at EOL were discussed. We discussed concern about current situation. Discuss concern about her rapid decline since admission. Discuss concern that symptoms may not be reversible or only partially reversible. Spouse and patient express understanding. Patient quite tearful. Emotional support provided.   I attempted to elicit values and goals of care important to the patient.  She tells me what is most important to her is to be at home. She is adamant she does not want to go to a facility. Spouse tells me he is at home all day and he feels he could care for her at home.   The difference between aggressive medical intervention and comfort care was considered in light  of the patient's goals of care.   Discussed with patient/family the importance of continued conversation with family and the medical providers regarding overall plan of care and treatment options, ensuring decisions are within the context of the patient's values and GOCs.    Hospice services outpatient were explained and offered. They were shocked at the mention of hospice. We discuss type of care provided and philosophy of hospice care. We discussed good support if goal of care is to be at home and transition to focus on comfort.   Encouraged patient/family to consider DNR/DNI status understanding evidenced based poor outcomes in similar hospitalized patients, as the cause of the arrest is likely associated with chronic/terminal disease rather than a reversible acute cardio-pulmonary event. Patient and spouse agree to DNR.  Questions and concerns were addressed. The family was encouraged to call with questions or concerns.   Following meeting I called and spoke to patient's daughter Museum/gallery conservator. We reviewed above conversation. She confirms that even prior to admission when patient was declining she had refused considering facility admission - only wanted to be home. We reviewed potential of hospice support at home. Amber is also tearful but agrees this likely makes the most sense and aligns with patient's goals. We reviewed code status change to DNR - Luetta Nutting shares this is consistent with patient's previously stated wishes and she agrees to code status change. We discussed allowing family time to process information and discuss plan of care. Discussed following up tomorrow. Amber agrees. Daughter asks about prognosis - discussed uncertainty of this but shared my concern of poor prognosis given rapid decline recently.   Recommendations/Plan: Code status change to DNR Patient adamant that she wants to go home - not interested in facility placement - family agrees Introduced hospice support at home - family  considering Will follow up with patient and family 9/30 (daughter will be at bedside 1-3pm)  Code Status: DNR  Discharge Planning: Family considering home with hospice  Care plan was discussed with patient, spouse, daughter, RN, Dr. Dagoberto Ligas  Thank you for allowing the Palliative Medicine Team to assist in the care of this patient.   Total Time 75 minutes Prolonged Time Billed  yes       Greater than 50%  of this time was spent counseling and coordinating care related to the above assessment and plan.  Juel Burrow, DNP, [facility] Palliative Medicine Team Team Phone # [phone]  Pager [phone]

## 2021-01-28 NOTE — Progress Notes (Addendum)
Speech Language Pathology Weekly Progress and Session Note  Patient Details  Name: Meagan Peck MRN: [id number] Date of Birth: 10-17-1960  Beginning of progress report period: 01/21/2021 End of progress report period:  01/28/2021  Today's Date: 01/28/2021 SLP Individual Time: 0175-1025 SLP Individual Time Calculation (min): 30 min  Short Term Goals: Week 2: SLP Short Term Goal 1 (Week 2): Patient will tolerate regular solid and thin liquid PO's in small amounts without overt s/s aspiration or penetration with supervisionA. SLP Short Term Goal 1 - Progress (Week 2): Not met SLP Short Term Goal 2 (Week 2): Patient will verbally express basic wants/needs with modA verbal cues to improve speech intelligibilty. SLP Short Term Goal 2 - Progress (Week 2): Not met SLP Short Term Goal 3 (Week 2): SLP will adjust and/or add goals if/when patient's functional status improves. SLP Short Term Goal 3 - Progress (Week 2): Progressing toward goal    New Short Term Goals: Week 3: SLP Short Term Goal 1 (Week 3): Patient will verbalize basic wants/needs and responses to questions at word level with maxA verbal cues for improving speech intelligibility. SLP Short Term Goal 2 (Week 3): Patient will tolerate trials of soft solids with SLP with minA cues for safety strategies. SLP Short Term Goal 3 (Week 3): SLP will determine if any benefit from basic level non-verbal communmication (communication board, etc) with modA.  Weekly Progress Updates:  Patient did not meet any STG's secondary to significant change in medical status since 9/21 which has not improved. Patient is extremely weak, severely dysarthric and diet consistencies were downgraded to puree solids from regular solids. SLP planning to focus further treatments on basic level functional communication and determining least restrictive diet.   Intensity: Minumum of 1-2 x/day, 30 to 90 minutes Frequency: 3 to 5 out of 7 days Duration/Length of  Stay: 10/4 Treatment/Interventions: Dysphagia/aspiration precaution training;Speech/Language facilitation;Patient/family education   Daily Session  Skilled Therapeutic Interventions: Patient seen for skilled ST session with focus on speech goals. Patient alert, continues to be weak and fatigued but appears a little more interactive. She reported that she had a small BM previous date which made her feel a little better. Patient was able to express basic wants/needs verbally at word level with maxA cues to repeat, slow down, use speech strategies. Patient declined any PO's and MD has changed diet order to clear liquids secondary to ileus. Patient continues to benefit from skilled SLP intervention with focus to be on functional communication and determining least restrictive oral diet.      General    Pain Pain Assessment Pain Scale: Faces Faces Pain Scale: Hurts a little bit Pain Type: Acute pain Pain Location: Abdomen Pain Descriptors / Indicators: Aching Pain Intervention(s): Other (Comment) (monitored during session)  Therapy/Group: Individual Therapy  Sonia Baller, MA, CCC-SLP Speech Therapy

## 2021-01-28 NOTE — Progress Notes (Addendum)
Meagan Peck PHYSICAL MEDICINE & REHABILITATION PROGRESS NOTE  Subjective/Complaints:   Pt reports 1 small BM yesterday- but not more.   Mg 1.5 Ammonia 113- slightly down from 140s- Pt also has ileus.     ROS: limited by cognition/dysarthria  Objective: Vital Signs: Blood pressure (!) 131/53, pulse 83, temperature 97.8 F (36.6 C), resp. rate 14, height 4' 10"  (1.473 m), weight 59.7 kg, last menstrual period 12/03/2013, SpO2 99 %. DG Abd 1 View  Result Date: 01/27/2021 CLINICAL DATA:  Constipation EXAM: ABDOMEN - 1 VIEW COMPARISON:  09/15/2020 FINDINGS: mild gaseous distention of both large and small bowel may reflect mild ileus. No organomegaly, free air or suspicious calcification. Tips stent noted in the right upper quadrant. Prior cholecystectomy. IMPRESSION: Mild gaseous distention of bowel diffusely likely mild ileus. Electronically Signed   By: Rolm Baptise M.D.   On: 01/27/2021 12:41   No results for input(s): WBC, HGB, HCT, PLT in the last 72 hours.   No results for input(s): NA, K, CL, CO2, GLUCOSE, BUN, CREATININE, CALCIUM in the last 72 hours.    Intake/Output Summary (Last 24 hours) at 01/28/2021 0919 Last data filed at 01/28/2021 0846 Gross per 24 hour  Intake 480 ml  Output 1 ml  Net 479 ml        Physical Exam: BP (!) 131/53 (BP Location: Right Arm)   Pulse 83   Temp 97.8 F (36.6 C)   Resp 14   Ht 4' 10"  (1.473 m)   Wt 59.7 kg   LMP 12/03/2013 (LMP Unknown)   SpO2 99%   BMI 27.51 kg/m      General: awake, alert, appropriate, laying supine in bed; not wearing EGS;  NAD HENT: conjugate gaze; oropharynx moist CV: regular rate; no JVD Pulmonary: CTA B/L; no W/R/R- good air movement GI: soft, NT, ND, (+)BS- slightly distended; hypoactive Psychiatric flat- tearful;  Neurological: Ox 1-2- very dsyarthric- more difficult to understand today   Ext: no clubbing, cyanosis, or edema Psych: flat but cooperative, delayed  Skin: No evidence of  breakdown, no evidence of rash  Neuro: Alert, dysconjugate gaze. Follows basic commands. Dysarthric, delayed. Motor: RUE: 0/5 proximal distal,  RLE: 0/5 proximal distal- no change today on R side ~ 3+/5 on L side Sensation diminished to light touch right hand, stable MSK- pain right shoulder with IR. Heel cords tight   Assessment/Plan: 1. Functional deficits which require 3+ hours per day of interdisciplinary therapy in a comprehensive inpatient rehab setting. Physiatrist is providing close team supervision and 24 hour management of active medical problems listed below. Physiatrist and rehab team continue to assess barriers to discharge/monitor patient progress toward functional and medical goals   Care Tool:  Bathing    Body parts bathed by patient: Chest, Abdomen   Body parts bathed by helper: Right arm, Left arm, Front perineal area, Buttocks, Right upper leg, Left upper leg, Right lower leg, Left lower leg, Face     Bathing assist Assist Level: Total Assistance - Patient < 25%     Upper Body Dressing/Undressing Upper body dressing   What is the patient wearing?: Pull over shirt    Upper body assist Assist Level: Total Assistance - Patient < 25%    Lower Body Dressing/Undressing Lower body dressing      What is the patient wearing?: Incontinence brief, Pants     Lower body assist Assist for lower body dressing: Total Assistance - Patient < 25%     Toileting Toileting  Toileting assist Assist for toileting: Maximal Assistance - Patient 25 - 49%     Transfers Chair/bed transfer  Transfers assist     Chair/bed transfer assist level: 2 Helpers (slide board)     Locomotion Ambulation   Ambulation assist   Ambulation activity did not occur: Safety/medical concerns (pt became emotional, did not have time for further mobility)  Assist level: Minimal Assistance - Patient > 75% Assistive device: Walker-rolling Max distance: 50'   Walk 10 feet  activity   Assist  Walk 10 feet activity did not occur: Safety/medical concerns  Assist level: Minimal Assistance - Patient > 75% Assistive device: Walker-rolling   Walk 50 feet activity   Assist Walk 50 feet with 2 turns activity did not occur: Safety/medical concerns  Assist level: Minimal Assistance - Patient > 75% Assistive device: Walker-rolling    Walk 150 feet activity   Assist Walk 150 feet activity did not occur: Safety/medical concerns         Walk 10 feet on uneven surface  activity   Assist Walk 10 feet on uneven surfaces activity did not occur: Safety/medical concerns         Wheelchair     Assist Is the patient using a wheelchair?: Yes Type of Wheelchair: Manual Wheelchair activity did not occur: Safety/medical concerns  Wheelchair assist level: Dependent - Patient 0%      Wheelchair 50 feet with 2 turns activity    Assist    Wheelchair 50 feet with 2 turns activity did not occur: Safety/medical concerns       Wheelchair 150 feet activity     Assist  Wheelchair 150 feet activity did not occur: Safety/medical concerns        Medical Problem List and Plan: 1.  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  Appreciate neurology follow up. No new neuro intereventions recommended -Continue CIR- PT, OT and SLP -called Neuro and they feel hyperammoniemia is cause of getting weaker- trying to fix this.   9/29- has ileus which is complicating  2.  Antithrombotics: -DVT/anticoagulation: SCDs 9/28- Plts 127- con't  to monitor  Pharmaceutical: Other (comment)             -antiplatelet therapy: N/A 3. Pain Management: Tylenol d/ced  given hepatic failure neurogenic pain over chest and abd bilateral , has some signal abnormality in thalamus bilaterally   Baclofen 5 BID started on 9/20, changed to nightly on 9/21 due to lethargy, d/ced on 9/23 d/t lethargy 4. Mood: Provide emotional support             -antipsychotic agents: N/A 5. Neuropsych: This patient is not  Fully capable of making decisions on her own behalf. Some emotional lability as expected given extensive brain stem lesion   9/27- will call palliative care to discuss pt's care. 9/28- consulted palliative care- spoke to them- wants ot determine goals of care.  9/29- they attempted to see, but couldn't speak with pt and couldn't reach husband. Willc on't to try  6. Skin/Wound Care: Routine skin checks 7. Fluids/Electrolytes/Nutrition:   I personally reviewed the patient's labs today.   8.  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  Neurology follow-up conservative care  9/29- spoke with Neuro- they think it's made worse by high ammonia-  Ammonia 113 today- will try to get her to go/have multiple BM's- complicated by ileus.  9.  Uncontrolled diabetes mellitus with hyperglycemia.  Hemoglobin A1c 10.7.   NovoLog 12 units 3 times daily, increase to 14 units on 9/16 Semglee 20 units twice daily.  Diabetic teaching- couldn't afford insulin at home.  CBG (last 3)  Recent Labs    01/27/21 1613 01/27/21 2138 01/28/21 0559  GLUCAP 96 144* 123*  9/26 increase semglee to 22u bid  9/28- BG's stable- con't regimen 10.  Acute on chronic anemia.  Continue iron supplement.    Hemoglobin 11.1 on 9/19--> 10.6 9/26  F/u Monday 11. Constipation in setting of cirrhosis: Cont chronulac 72m 5x daily  9/27- LBM yesterday small x1- will give sorbitol x1 today and con't lactulose- also on Rifaxamin.  12. Hiccups-?  Resolved 13. Thrombocytopenia  Platelets 106 on 9/19--> 127k 9/26  Continue to monitor 14.  Cervical stenosis  MRI showing multiple  abnormalities  Appreciate neurosurgery recs, no plans for intervention at present 15.  NASH Cirrhosis    mild elevation of AST--> sl trend up 9/26 however--f/u later this week 16.  Hypoalbuminemia  Supplement initiated  17.  Hyperammonemia  Ammonia 121 on 9/22---> up to 145 9/26  -already receiving lactulose 337m5 x daily  -will ask GI for further recs to treat  9/28- will check KUB as to why cannot get her to poop- and get cleaned out.  18.  Bilateral ankle heel cord tightness add PRAFO   9/27- con't PRAFOs- pt appears to be getting weaker? Will d/w therapy.  19. Ileus  9/29- will change to clear liquid diet and IVFs 75cc/hour x 1 days and give Sorbitol and enema to try and get her moving- making high ammonia worse 20. Hypomagnesemia  9/29- will give MgOx 800 mg BID and recheck in AM- might need IV Mg.   I spent a total of 38 minutes on total care- >50%^ coordination of care- calling daughter and speaking with PA.   LOS: 16 days A FACE TO FACE EVALUATION WAS PERFORMED  Adriena Manfre 01/28/2021, 9:19 AM

## 2021-01-28 NOTE — Progress Notes (Signed)
Occupational Therapy Session Note  Patient Details  Name: Meagan Peck MRN: [id number] Date of Birth: [birth year]  Today's Date: 01/28/2021 OT Individual Time: 4098-1191 OT Individual Time Calculation (min): 69 min    Short Term Goals: Week 3:  OT Short Term Goal 1 (Week 3): Pt will roll R/L in bed with max A to facilitate dressing/bathing tasks at bed level OT Short Term Goal 2 (Week 3): Pt will perform self feeding with max A OT Short Term Goal 3 (Week 3): Pt will maintan sitting balance with max A+1 in preparation for BADLs and transfers  Skilled Therapeutic Interventions/Progress Updates:    OT intervention with focus on bed mobility, static sitting balance in long sitting, RUE PROM and LUE AROM/AAROM to facilitate pt's ability to activiely participate in self care. Mod A for rolling R/L in bed using bed rails. Dependent for bathing/dressing/toileting at bed level. Pt with increased Rt knee flexion at bed level. RUE flaccid. Practiced activiating soft call bell when positioned correctly. Cell phone holder positioned to facilitate pt's ability to see her phone. Pt unable to activate phone by touch or voice command at this time. Pt remained in bed with bed alarm activated and soft call bell positioned for pt to activate.  Therapy Documentation Precautions:  Precautions Precautions: Fall Precaution Comments: R sided weakness, fall risk Restrictions Weight Bearing Restrictions: No Pain: Pt reports discomfort Rt triceps with PROM/stretching; resolved with continued stretching   Therapy/Group: Individual Therapy  Leroy Libman 01/28/2021, 10:45 AM

## 2021-01-29 ENCOUNTER — Inpatient Hospital Stay (HOSPITAL_COMMUNITY): Payer: Medicaid Other

## 2021-01-29 DIAGNOSIS — R4701 Aphasia: Secondary | ICD-10-CM

## 2021-01-29 LAB — COMPREHENSIVE METABOLIC PANEL
ALT: 48 U/L — ABNORMAL HIGH (ref 0–44)
AST: 57 U/L — ABNORMAL HIGH (ref 15–41)
Albumin: 2.1 g/dL — ABNORMAL LOW (ref 3.5–5.0)
Alkaline Phosphatase: 113 U/L (ref 38–126)
Anion gap: 7 (ref 5–15)
BUN: 20 mg/dL (ref 6–20)
CO2: 22 mmol/L (ref 22–32)
Calcium: 8.4 mg/dL — ABNORMAL LOW (ref 8.9–10.3)
Chloride: 110 mmol/L (ref 98–111)
Creatinine, Ser: 0.83 mg/dL (ref 0.44–1.00)
GFR, Estimated: >60 mL/min (ref >60)
Glucose, Bld: 74 mg/dL (ref 70–99)
Potassium: 3.7 mmol/L (ref 3.5–5.1)
Sodium: 139 mmol/L (ref 135–145)
Total Bilirubin: 4.2 mg/dL — ABNORMAL HIGH (ref 0.3–1.2)
Total Protein: 4.6 g/dL — ABNORMAL LOW (ref 6.5–8.1)

## 2021-01-29 LAB — CBC WITH DIFFERENTIAL/PLATELET
Abs Immature Granulocytes: 0.02 10*3/uL (ref 0.00–0.07)
Basophils Absolute: 0.1 10*3/uL (ref 0.0–0.1)
Basophils Relative: 1 %
Eosinophils Absolute: 0.2 10*3/uL (ref 0.0–0.5)
Eosinophils Relative: 3 %
HCT: 27.2 % — ABNORMAL LOW (ref 36.0–46.0)
Hemoglobin: 10.1 g/dL — ABNORMAL LOW (ref 12.0–15.0)
Immature Granulocytes: 0 %
Lymphocytes Relative: 21 %
Lymphs Abs: 1.6 10*3/uL (ref 0.7–4.0)
MCH: 35.3 pg — ABNORMAL HIGH (ref 26.0–34.0)
MCHC: 37.1 g/dL — ABNORMAL HIGH (ref 30.0–36.0)
MCV: 95.1 fL (ref 80.0–100.0)
Monocytes Absolute: 0.6 10*3/uL (ref 0.1–1.0)
Monocytes Relative: 8 %
Neutro Abs: 4.9 10*3/uL (ref 1.7–7.7)
Neutrophils Relative %: 67 %
Platelets: 127 10*3/uL — ABNORMAL LOW (ref 150–400)
RBC: 2.86 MIL/uL — ABNORMAL LOW (ref 3.87–5.11)
RDW: 16.7 % — ABNORMAL HIGH (ref 11.5–15.5)
WBC: 7.5 10*3/uL (ref 4.0–10.5)
nRBC: 0 % (ref 0.0–0.2)

## 2021-01-29 LAB — MAGNESIUM: Magnesium: 1.3 mg/dL — ABNORMAL LOW (ref 1.7–2.4)

## 2021-01-29 LAB — GLUCOSE, CAPILLARY
Glucose-Capillary: 71 mg/dL (ref 70–99)
Glucose-Capillary: 85 mg/dL (ref 70–99)
Glucose-Capillary: 130 mg/dL — ABNORMAL HIGH (ref 70–99)
Glucose-Capillary: 186 mg/dL — ABNORMAL HIGH (ref 70–99)

## 2021-01-29 LAB — AMMONIA: Ammonia: 118 umol/L — ABNORMAL HIGH (ref 9–35)

## 2021-01-29 MED ORDER — LACTULOSE 10 GM/15ML PO SOLN
60.0000 g | Freq: Four times a day (QID) | ORAL | Status: DC
  Administered 2021-01-29 – 2021-02-06 (×14): 60 g via ORAL
  Administered 2021-02-07: 20 g via ORAL
  Administered 2021-02-07 – 2021-02-08 (×2): 60 g via ORAL
  Filled 2021-01-29 (×43): qty 90

## 2021-01-29 MED ORDER — MAGNESIUM SULFATE 4 GM/100ML IV SOLN
4.0000 g | Freq: Once | INTRAVENOUS | Status: AC
  Administered 2021-01-29: 4 g via INTRAVENOUS
  Filled 2021-01-29: qty 100

## 2021-01-29 NOTE — Consult Note (Signed)
Neuropsychological Consultation   Patient:   Meagan Peck   DOB:   04-Apr-1961  MR Number:  [id number]  Location:  [facility] [id number] [place] [address] Dept: [place]: [phone]           Date of Service:   01/29/2021  Start Time:   9 AM End Time:   9:45 AM  Provider/Observer:  Ilean Skill, Psy.D.       Clinical Neuropsychologist       Billing Code/Service: [id number]  Chief Complaint:    Meagan Peck is a 60 year old female with a history of significant Meagan Peck cirrhosis and previous hepatic encephalopathy, diabetes, hypertension, hyperlipidemia, esophageal varices/GERD.  Patient presented on 01/08/2021 with altered mental status and slurred speech.  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  Numerous abnormalities were noted on brain MRI and cervical spine.  Moderate to severe spinal stenosis C4-5 and moderate C5-6 and C7-7 there was some mass-effect on the spinal cord at these levels.    Reason for Service:  Patient referred for neuropsychological consultation due to coping and adjustment issues in the setting of significant expressive aphasia.  Has palliative care consult and family and patient along with palliative care have developed plan follow-up patient with significant expressive language and motor deficits.  Below is HPI for the current admission.  HPI: Meagan Peck is a 60 year old right-handed female with history significant of Meagan Peck cirrhosis with previous hepatic encephalopathy maintained on chronic Cipro, diabetes mellitus hypertension hyperlipidemia, esophageal varices/GERD.  Per chart review patient lives with spouse and family.  Two-level home able to live on the main level.  Needed some assist with ADLs she mostly sponge bathes.   Presented 01/08/2021 altered mental status and slurred speech.  Denied any chills or fever.  Admission chemistries unremarkable except glucose 348, AST 54, ammonia level 83, hemoglobin A1c 10.7, hemoglobin 9.2, urinalysis negative nitrite.  Cranial CT scan negative for acute changes.  MRI showed striking abnormal diffusion, T2 and flair signal abnormality symmetrically affecting the pons with sparing of the bilateral cortical infarcts favoring osmotic demyelination syndrome.  Relatively subtle signal changes also in the bilateral thalamus felt to be nonspecific.  Ultrasound the abdomen showed trace upper abdominal ascites and no plan for paracentesis.  MRI cervical spine degenerative changes moderate to severe spinal stenosis C4-5 and moderate C5-6 and C6-7.  There was some mass-effect on the cord at these levels worse at C4-5 without cord signal abnormality.  Latest ammonia level 139 continues on scheduled Chronulac.  Therapy evaluations completed due to patient decreased functional mobility was admitted for a comprehensive rehab program.  Current Status:  Patient laying in her bed but awake.  Crying and extreme deficits in her ability to express herself even though she is attempting to.  Slurring, moaning, and significant verbal fluency deficits and articulation deficits noted.  Patient was able to demonstrate her awareness that she was in the hospital and was facing significant and serious medical issues and neurological issues.  Patient clearly expressed her fear of going into a nursing home but also clear that this is a real possibility as far as her potential discharge status.  Family is involved in decision making.  Patient with cognitive deficits to the point that would make her hard to clearly understand the complex and rare medical situation she is in.  She is aware of how serious and significant her medical status is.  She has been deteriorating even with all of the efforts to stabilize her medically and  metabolically.  Patient denied significant pain even though she was constantly moaning and crying throughout visit but the degree of to what degree her moaning was related to her emotional status or her limited ability to express herself verbally was unclear.  Patient clearly in distress about her status and fear for her prognosis.  However, she denied significant pain and differentiating her significant expressive language deficits and attempts to talk versus crying and tearfulness is difficult to gauge.  Behavioral Observation: Meagan Peck  presents as a 60 y.o.-year-old Right handed Caucasian Female who appeared her stated age. her dress was Appropriate and she was Well Groomed and her manners were inappropriate to the situation.  her participation was indicative of Inattentive behaviors.  There were physical disabilities noted.  she displayed an appropriate level of cooperation and motivation.     Interactions:    Active Inattentive and Redirectable  Attention:   abnormal and attention span appeared shorter than expected for age  Memory:   abnormal; global memory impairment noted  Visuo-spatial:  not examined  Speech (Volume):  high  Speech:   non-fluent aphasia; slurred  Thought Process:  Circumstantial  Though Content:  Rumination; not suicidal and not homicidal  Orientation:   person and situation  Judgment:   Poor  Planning:   Poor  Affect:    Depressed, Lethargic, and Tearful  Mood:    Anxious, Depressed, and Dysphoric  Insight:   Shallow  Intelligence:   normal   Medical History:   Past Medical History:  Diagnosis Date   Acute medial meniscus tear of right knee 12/10/2015   Allergy    Anasarca    Arthritis    bil feet   Ascites    Cataract    bilateral - surgery to remove   Cirrhosis ([place])    Diabetes mellitus without complication ([place])    type 2 - last a1c was 5.2 per patient, no meds   Dyspnea    when I have too much fluid   Esophageal varices  (HCC)    GERD (gastroesophageal reflux disease)    Heart murmur    Hepatic cirrhosis (HCC)    HLD (hyperlipidemia)    no meds   Hypertension    no meds   Iron deficiency anemia 09/26/2018         Patient Active Problem List   Diagnosis Date Noted   Expressive aphasia    Muscle tightness    Transaminitis    Hypoalbuminemia due to protein-calorie malnutrition ([place])    Spinal stenosis in cervical region    Acute on chronic anemia    Uncontrolled type 2 diabetes mellitus with hyperglycemia (HCC)    Demyelinating disease of central nervous system, unspecified ([place]) 01/12/2021   Hepatic encephalopathy ([place]) 01/08/2021   Liver cirrhosis secondary to NASH ([place]) 01/27/2020   Abnormal weight gain 01/27/2020   Anasarca 01/27/2020   Electrolyte disturbance 01/27/2020   Hospital discharge follow-up 01/27/2020   NASH (nonalcoholic steatohepatitis)    Portal hypertension (HCC)    Angiodysplasia of upper gastrointestinal tract    SBP (spontaneous bacterial peritonitis) ([place]) 01/08/2020   Esophageal varices without bleeding ([place])    Hypertension associated with diabetes ([place]) 08/16/2019   Chest pain 11/27/2017   Vitamin D deficiency 05/15/2017   Iron deficiency 05/15/2017   Breast screening declined 05/15/2017  Colonoscopy refused 05/15/2017   Choroidal nevus of both eyes 07/18/2016   Corneal epithelial and basement membrane dystrophy 07/18/2016   Thrombocytopenia ([place]) 04/14/2016   Hyperlipidemia associated with type 2 diabetes mellitus ([place]) 05/25/2015   Gastroesophageal reflux disease 05/25/2015   Type 2 diabetes mellitus with other specified complication ([place]) 04/24/8249         Psychiatric History:  No prior psychiatric history noted  Family Med/Psych History:  Family History  Problem Relation Age of Onset   Hypertension Mother    COPD Mother    Alcoholism Father    Heart attack Father    Diabetes Brother    Diabetes Paternal Grandmother    Heart disease Paternal  Grandmother    Alcoholism Paternal Uncle    Alcoholism Maternal Uncle    Heart disease Maternal Grandmother    Heart disease Maternal Grandfather    Heart disease Paternal Grandfather     Risk of Suicide/Violence: low patient with severe and significant motor deficits globally but near-total loss of motor function on the right side and very limited motor function for left side.  Impression/DX:  Meagan Peck is a 60 year old female with a history of significant [place] cirrhosis and previous hepatic encephalopathy, diabetes, hypertension, hyperlipidemia, esophageal varices/GERD.  Patient presented on 01/08/2021 with altered mental status and slurred speech.  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  Numerous abnormalities were noted on brain MRI and cervical spine.  Moderate to severe spinal stenosis C4-5 and moderate C5-6 and C7-7 there was some mass-effect on the spinal cord at these levels.   Patient laying in her bed but awake.  Crying and extreme deficits in her ability to express herself even though she is attempting to.  Slurring, moaning, and significant verbal fluency deficits and articulation deficits noted.  Patient was able to demonstrate her awareness that she was in the hospital and was facing significant and serious medical issues and neurological issues.  Patient clearly expressed her fear of going into a nursing home but also clear that this is a real possibility as far as her potential discharge status.  Family is involved in decision making.  Patient with cognitive deficits to the point that would make her hard to clearly understand the complex and rare medical situation she is in.  She is aware of how serious and significant her medical status is.  She has been deteriorating even with all of the efforts to stabilize her medically and metabolically.  Patient  denied significant pain even though she was constantly moaning and crying throughout visit but the degree of to what degree her moaning was related to her emotional status or her limited ability to express herself verbally was unclear.  Patient clearly in distress about her status and fear for her prognosis.  However, she denied significant pain and differentiating her significant expressive language deficits and attempts to talk versus crying and tearfulness is difficult to gauge.  Disposition/Plan:  We will continue to monitor her status but at this point the patient has not been able to make any significant improvements in status and she does appear through records to be continuing to show severe deficits and worsening in certain aspects of cognitive and motor functioning.  Diagnosis:    Hepatic encephalopathy ([place]) - Plan: Ambulatory referral to Neurology  Constipation - Plan: DG Abd 1 View, DG Abd 1 View  Ileus [facility]) - Plan: DG Abd 1 View,  DG Abd 1 View         Electronically Signed   _______________________ Ilean Skill, Psy.D. Clinical Neuropsychologist

## 2021-01-29 NOTE — Progress Notes (Signed)
Physical Therapy Session Note  Patient Details  Name: Meagan Peck MRN: [id number] Date of Birth: [birth year]  Today's Date: 01/29/2021     Skilled Therapeutic Interventions/Progress Updates: Pt missed 60 min skilled PT due to pt fatigue and significant change in pt's function as was previously scheduled for family education. PTA spoke with pt and family providing emotional support. Aniticipate d/c home with hospice.      Therapy Documentation Precautions:  Precautions Precautions: Fall Precaution Comments: R sided weakness, fall risk Restrictions Weight Bearing Restrictions: No   Therapy/Group: Individual Therapy  Shariece Viveiros Navjot Loera, PTA  01/29/2021, 7:55 AM

## 2021-01-29 NOTE — Progress Notes (Signed)
Palliative:  Chart reviewed. Visited patient briefly prior to neuro psych eval - she was immediately tearful when I approached her. Unable to engage in conversation.  No family at bedside - checked later in day as well and no family. Attempted to call daughter Luetta Nutting - no answer, voice mail left.  Will ask other members of palliative team to follow up to continue goals of care discussions.  Juel Burrow, DNP, AGNP-C Palliative Medicine Team Team Phone # [phone]  Pager # [phone]  NO CHARGE

## 2021-01-29 NOTE — Progress Notes (Signed)
[facility] PHYSICAL MEDICINE & REHABILITATION PROGRESS NOTE  Subjective/Complaints:   Pt reports had 2 BM's yesterday - none documented but pt was clear she had enema???  F/U KUB shows improving ileus- if pt had 2 BM's yesterday- will increase Lactulose ot get her going 3-4x/day.    ROS: limited by cognition/dysarthria  Objective: Vital Signs: Blood pressure (!) 133/58, pulse 95, temperature 98.1 F (36.7 C), temperature source Oral, resp. rate 16, height 4' 10"  (1.473 m), weight 59.7 kg, last menstrual period 12/03/2013, SpO2 99 %. DG Abd 1 View  Result Date: 01/29/2021 CLINICAL DATA:  Abdominal distension and pain rule out ileus EXAM: ABDOMEN - 1 VIEW COMPARISON:  01/27/2021 FINDINGS: Mild distention of large and small bowel loops has improved in the interval. No bowel wall edema. Small amount of stool in the colon. Tips noted right upper quadrant. IMPRESSION: Improving ileus. Electronically Signed   By: Franchot Gallo M.D.   On: 01/29/2021 11:46   Recent Labs    01/29/21 0510  WBC 7.5  HGB 10.1*  HCT 27.2*  PLT 127*     Recent Labs    01/29/21 0510  NA 139  K 3.7  CL 110  CO2 22  GLUCOSE 74  BUN 20  CREATININE 0.83  CALCIUM 8.4*      Intake/Output Summary (Last 24 hours) at 01/29/2021 1230 Last data filed at 01/29/2021 0800 Gross per 24 hour  Intake 420.89 ml  Output --  Net 420.89 ml        Physical Exam: BP (!) 133/58 (BP Location: Right Arm)   Pulse 95   Temp 98.1 F (36.7 C) (Oral)   Resp 16   Ht 4' 10"  (1.473 m)   Wt 59.7 kg   LMP 12/03/2013 (LMP Unknown)   SpO2 99%   BMI 27.51 kg/m       General: awake, alert, appropriate, tearful/crying a lot- supine in bed; NAD HENT: conjugate gaze; oropharynx moist CV: regular rate; no JVD Pulmonary: CTA B/L; no W/R/R- good air movement GI: soft, NT, ND, (+)BS- less distended; hypoactive Psychiatric: appropriate but crying/tearful about dx Neurological: Ox2- dysarthric- hard ot understand RUE  trace in grip and biceps- otherwise 0/5  Ext: no clubbing, cyanosis, or edema Psych: flat but cooperative, delayed  Skin: No evidence of breakdown, no evidence of rash  Neuro: Alert, dysconjugate gaze. Follows basic commands. Dysarthric, delayed. Motor: RUE: 0/5 proximal distal,  RLE: 0/5 proximal distal- no change today on R side ~ 3+/5 on L side Sensation diminished to light touch right hand, stable MSK- pain right shoulder with IR. Heel cords tight   Assessment/Plan: 1. Functional deficits which require 3+ hours per day of interdisciplinary therapy in a comprehensive inpatient rehab setting. Physiatrist is providing close team supervision and 24 hour management of active medical problems listed below. Physiatrist and rehab team continue to assess barriers to discharge/monitor patient progress toward functional and medical goals   Care Tool:  Bathing    Body parts bathed by patient: Chest, Abdomen   Body parts bathed by helper: Right arm, Left arm, Front perineal area, Buttocks, Right upper leg, Left upper leg, Right lower leg, Left lower leg, Face     Bathing assist Assist Level: Total Assistance - Patient < 25%     Upper Body Dressing/Undressing Upper body dressing   What is the patient wearing?: Pull over shirt    Upper body assist Assist Level: Total Assistance - Patient < 25%    Lower Body Dressing/Undressing Lower  body dressing      What is the patient wearing?: Incontinence brief, Pants     Lower body assist Assist for lower body dressing: Total Assistance - Patient < 25%     Toileting Toileting    Toileting assist Assist for toileting: Maximal Assistance - Patient 25 - 49%     Transfers Chair/bed transfer  Transfers assist     Chair/bed transfer assist level: 2 Helpers (slide board)     Locomotion Ambulation   Ambulation assist   Ambulation activity did not occur: Safety/medical concerns (pt became emotional, did not have time for further  mobility)  Assist level: Minimal Assistance - Patient > 75% Assistive device: Walker-rolling Max distance: 50'   Walk 10 feet activity   Assist  Walk 10 feet activity did not occur: Safety/medical concerns  Assist level: Minimal Assistance - Patient > 75% Assistive device: Walker-rolling   Walk 50 feet activity   Assist Walk 50 feet with 2 turns activity did not occur: Safety/medical concerns  Assist level: Minimal Assistance - Patient > 75% Assistive device: Walker-rolling    Walk 150 feet activity   Assist Walk 150 feet activity did not occur: Safety/medical concerns         Walk 10 feet on uneven surface  activity   Assist Walk 10 feet on uneven surfaces activity did not occur: Safety/medical concerns         Wheelchair     Assist Is the patient using a wheelchair?: Yes Type of Wheelchair: Manual Wheelchair activity did not occur: Safety/medical concerns  Wheelchair assist level: Dependent - Patient 0%      Wheelchair 50 feet with 2 turns activity    Assist    Wheelchair 50 feet with 2 turns activity did not occur: Safety/medical concerns       Wheelchair 150 feet activity     Assist  Wheelchair 150 feet activity did not occur: Safety/medical concerns        Medical Problem List and Plan: 1.  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  Appreciate neurology follow up. No new neuro intereventions recommended -Continue CIR- PT, OT and SLP -called Neuro and they feel hyperammoniemia is cause of getting weaker- trying to fix this.   9/29- has ileus which is complicating   6/31- ileus improving, but still not having multiple Bms/day.  2.   Antithrombotics: -DVT/anticoagulation: SCDs 9/28- Plts 127- con't  to monitor  Pharmaceutical: Other (comment)             -antiplatelet therapy: N/A 3. Pain Management: Tylenol d/ced given hepatic failure neurogenic pain over chest and abd bilateral , has some signal abnormality in thalamus bilaterally   Baclofen 5 BID started on 9/20, changed to nightly on 9/21 due to lethargy, d/ced on 9/23 d/t lethargy 4. Mood: Provide emotional support             -antipsychotic agents: N/A 5. Neuropsych: This patient is not  Fully capable of making decisions on her own behalf. Some emotional lability as expected given extensive brain stem lesion   9/27- will call palliative care to discuss pt's care. 9/28- consulted palliative care- spoke to them- wants ot determine goals of care.  9/29- they attempted to see, but couldn't speak with pt and couldn't reach husband.  Willc on't to try  9/30- pallative care saw pt- made DNR- she wants ot go home- had pt see Neuropsych today as well.  6. Skin/Wound Care: Routine skin checks 7. Fluids/Electrolytes/Nutrition:   I personally reviewed the patient's labs today.   8.  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  Neurology follow-up conservative care  9/29- spoke with Neuro- they think it's made worse by high ammonia- Ammonia 113 today- will try to get her to go/have multiple BM's- complicated by ileus.   9/30- Ammonia 118- working on increasing lactulose.  9.  Uncontrolled diabetes mellitus with hyperglycemia.  Hemoglobin A1c 10.7.   NovoLog 12 units 3 times daily, increase to 14 units on 9/16 Semglee 20 units twice daily.  Diabetic teaching- couldn't afford insulin at home.  CBG (last 3)  Recent Labs    01/28/21 2122 01/29/21 0557 01/29/21 1147  GLUCAP 115* 71 85  9/26 increase semglee to 22u bid  9/30- BG's controlled- con't regimen 10.  Acute on chronic anemia.  Continue iron supplement.    Hemoglobin 11.1 on 9/19--> 10.6 9/26  F/u Monday 11.  Constipation in setting of cirrhosis: Cont chronulac 73m 5x daily  9/27- LBM yesterday small x1- will give sorbitol x1 today and con't lactulose- also on Rifaxamin.  9/30- ileus resolving- will increase Lactulose 12. Hiccups-?  Resolved 13. Thrombocytopenia  Platelets 106 on 9/19--> 127k 9/26  Continue to monitor 14.  Cervical stenosis  MRI showing multiple abnormalities  Appreciate neurosurgery recs, no plans for intervention at present 15.  NASH Cirrhosis    mild elevation of AST--> sl trend up 9/26 however--f/u later this week 16.  Hypoalbuminemia  Supplement initiated  17.  Hyperammonemia  Ammonia 121 on 9/22---> up to 145 9/26  -already receiving lactulose 377m5 x daily  -will ask GI for further recs to treat  9/28- will check KUB as to why cannot get her to poop- and get cleaned out.   9/30- Ammonia 118- will increase lactulose 18.  Bilateral ankle heel cord tightness add PRAFO   9/27- con't PRAFOs- pt appears to be getting weaker? Will d/w therapy.  19. Ileus  9/29- will change to clear liquid diet and IVFs 75cc/hour x 1 days and give Sorbitol and enema to try and get her moving- making high ammonia worse 20. Hypomagnesemia  9/29- will give MgOx 800 mg BID and recheck in AM- might need IV Mg.   9/30- will give IV Mg and recheck levels Sunday. Can give IV 4G per d/w pharmacy  I spent a total of 35 minutes on total care- >50% coordination of care- d/w neuropsych and prolonged time with pt.   LOS: 17 days A FACE TO FACE EVALUATION WAS PERFORMED  Meagan Peck 01/29/2021, 12:30 PM

## 2021-01-29 NOTE — Plan of Care (Signed)
  Problem: Consults Goal: RH GENERAL PATIENT EDUCATION Description: See Patient Education module for education specifics. Outcome: Progressing Goal: Diabetes Guidelines if Diabetic/Glucose > 140 Description: If diabetic or lab glucose is > 140 mg/dl - Initiate Diabetes/Hyperglycemia Guidelines & Document Interventions  Outcome: Progressing   Problem: RH SKIN INTEGRITY Goal: RH STG MAINTAIN SKIN INTEGRITY WITH ASSISTANCE Description: STG Maintain Skin Integrity With Supervision Assistance. Outcome: Progressing   Problem: RH SAFETY Goal: RH STG DECREASED RISK OF FALL WITH ASSISTANCE Description: STG Decreased Risk of Fall With A Supervision ssistance. Outcome: Progressing   Problem: RH PAIN MANAGEMENT Goal: RH STG PAIN MANAGED AT OR BELOW PT'S PAIN GOAL Description: < 3 on a 0-10 pain scale. Outcome: Progressing   Problem: RH KNOWLEDGE DEFICIT GENERAL Goal: RH STG INCREASE KNOWLEDGE OF SELF CARE AFTER HOSPITALIZATION Description: Patient will demonstrate knowledge of medication management, pain management, diabetes management with educational materials and handouts provided by staff independently at discharge. Outcome: Progressing

## 2021-01-29 NOTE — Progress Notes (Signed)
Occupational Therapy Session Note  Patient Details  Name: Meagan Peck MRN: [id number] Date of Birth: Mar 09, 1961  Today's Date: 01/29/2021 OT Individual Time: 1300-1315 OT Individual Time Calculation (min): 15 min  and Today's Date: 01/29/2021 OT Missed Time: 30 Minutes Missed Time Reason: Other (comment) (pt with daughter and emotionaly distraught)   Short Term Goals: Week 3:  OT Short Term Goal 1 (Week 3): Pt will roll R/L in bed with max A to facilitate dressing/bathing tasks at bed level OT Short Term Goal 2 (Week 3): Pt will perform self feeding with max A OT Short Term Goal 3 (Week 3): Pt will maintan sitting balance with max A+1 in preparation for BADLs and transfers  Skilled Therapeutic Interventions/Progress Updates:    Pt resting in bed upon arrival. Pt tearful and unable to actively engage. Provided emotional support. OTA checked back later and daughter at bedside. Pt and daughter very tearful. Pt and daughter unable to meaningfully engage. Pt missed 30 mins skilled OT sessions.   Therapy Documentation Precautions:  Precautions Precautions: Fall Precaution Comments: R sided weakness, fall risk Restrictions Weight Bearing Restrictions: No General: General OT Amount of Missed Time: 30 Minutes Pain:  Pt reports pain in RUE when repositioned; emotional support   Therapy/Group: Individual Therapy  Leroy Libman 01/29/2021, 2:41 PM

## 2021-01-29 NOTE — Progress Notes (Signed)
Nutrition Follow-up  DOCUMENTATION CODES:   Not applicable  INTERVENTION:  Provide Ensure Max po BID, each supplement provides 150 kcal and 30 grams of protein.    Continue 30 ml Prosource plus po BID, each supplement provides 100 kcal and 15 grams of protein.    Encourage adequate PO intake.  NUTRITION DIAGNOSIS:   Increased nutrient needs related to chronic illness (NASH cirrhosis) as evidenced by estimated needs; ongoing  GOAL:   Patient will meet greater than or equal to 90% of their needs; progressing  MONITOR:   PO intake, Supplement acceptance, Skin, Weight trends, Labs, I & O's  REASON FOR ASSESSMENT:   Malnutrition Screening Tool    ASSESSMENT:   60 year old right-handed female with history significant of [name] cirrhosis with previous hepatic encephalopathy maintained on chronic Cipro, diabetes mellitus hypertension hyperlipidemia, esophageal varices/GERD. Presented 01/08/2021 altered mental status and slurred speech. MRI showed striking abnormal diffusion, T2 and flair signal abnormality symmetrically affecting the pons with sparing of the bilateral cortical infarcts favoring osmotic demyelination syndrome. 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  Diet advanced to a full liquid diet. KUB showing improving ileus per MD. Meal completion has been 25%. Pt currently has Prosource plus and Ensure max ordered and has been consuming them. RD to continue with current orders to aid in caloric and protein needs.   Labs and medications reviewed.  Diet Order:   Diet Order             Diet full liquid Room service appropriate? Yes; Fluid consistency: Thin  Diet effective now                   EDUCATION NEEDS:   Not appropriate for education at this time  Skin:  Skin Assessment: Reviewed RN Assessment  Last BM:  9/28  Height:   Ht Readings from Last 1  Encounters:  01/12/21 4' 10"  (1.473 m)    Weight:   Wt Readings from Last 1 Encounters:  01/26/21 59.7 kg   BMI:  Body mass index is 27.51 kg/m.  Estimated Nutritional Needs:   Kcal:  1700-1900  Protein:  80-95 grams  Fluid:  >/= 1.7 L/day  [name], MS, RD, LDN RD pager number/after hours weekend pager number on Amion.

## 2021-01-29 NOTE — Progress Notes (Signed)
Speech Language Pathology Daily Session Note  Patient Details  Name: TIARE ROHLMAN MRN: [id number] Date of Birth: 1960/09/29  Today's Date: 01/29/2021 SLP Individual Time: 1355-1415 SLP Individual Time Calculation (min): 20 min  Short Term Goals: Week 3: SLP Short Term Goal 1 (Week 3): Patient will verbalize basic wants/needs and responses to questions at word level with maxA verbal cues for improving speech intelligibility. SLP Short Term Goal 2 (Week 3): Patient will tolerate trials of soft solids with SLP with minA cues for safety strategies. SLP Short Term Goal 3 (Week 3): SLP will determine if any benefit from basic level non-verbal communmication (communication board, etc) with modA.  Skilled Therapeutic Interventions:   Patient seen with daughter present in room for skilled ST session. Patient and daughter both understandably very tearful as they have discussed with palliative and patient is understanding and scared regarding her prognosis. Patient did demonstrate improved speech intelligibility and she reported it was because she wasn't "wiped out" today. SLP discussed briefly recommendation for her to chunk information into one word at a time due to her severe dysarthria and she was understanding. SLP left patient in bed with daughter in room.   Pain Pain Assessment Pain Scale: 0-10 Faces Pain Scale: No hurt  Therapy/Group: Individual Therapy  Sonia Baller, MA, CCC-SLP Speech Therapy

## 2021-01-30 LAB — GLUCOSE, CAPILLARY
Glucose-Capillary: 96 mg/dL (ref 70–99)
Glucose-Capillary: 163 mg/dL — ABNORMAL HIGH (ref 70–99)
Glucose-Capillary: 158 mg/dL — ABNORMAL HIGH (ref 70–99)
Glucose-Capillary: 136 mg/dL — ABNORMAL HIGH (ref 70–99)

## 2021-01-30 NOTE — Progress Notes (Signed)
Physical Therapy Session Note  Patient Details  Name: Meagan Peck MRN: [id number] Date of Birth: 1961/04/13  Today's Date: 01/30/2021 PT Individual Time: 1300-1343 PT Individual Time Calculation (min): 43 min   Short Term Goals: Week 3:  PT Short Term Goal 1 (Week 3): Pt will perform supine to/from sit with assist x 1 consistently PT Short Term Goal 2 (Week 3): Pt will perform least restrictive transfer with assist x 1 consistently PT Short Term Goal 3 (Week 3): Pt will tolerate sitting OOB in chair x 1 hour  Skilled Therapeutic Interventions/Progress Updates:    Pt received seated in TIS with c/o high pain in hips. Pt emotional and unable to quantify. Session focused on functional transfers, static sitting balance, and BUE therex to support ROM and functional movement. At start, pt transferred TIS WC > EOB with MaxA +2 and use of slide board. Of note, pt demonstrated ability to assist transfer with push of LUE on armrest/slideboard. Pt then completed 2x5 B LAQ with VC to complete full ROM and MinA +1 to maintain sit balance. Pt then compelted 4x sit to stand with MaxA +2 for 3 trials and ModA +2 on final trial. Pt held standing position for 20 sec with MaxA +2. In sitting, pt completed 3x 30sec trials of upright sitting with no support. Pt demonstrated lateral lean over time that was corrected with visual cue of vertical lines in room. Pt also had forward lean that was corrected with VC. Pt then returned to bed with total A +2. In bed, pt completed 3x5 AAROM B shoulder flex and L grip squeeze to facilitate active movement and promote support of RUE from LUE. At end of session, pt was left supine in bed with alarm engaged, call bell, and all needs in reach.    Therapy Documentation Precautions:  Precautions Precautions: Fall Precaution Comments: R sided weakness, fall risk Restrictions Weight Bearing Restrictions: No  Pain: Pt reported pain in hips but was unable to verbalized number. Pn  relieved with return to bed.    Therapy/Group: Individual Therapy  Marquette Saa, PT, DPT 01/30/2021, 2:22 PM

## 2021-01-30 NOTE — Progress Notes (Signed)
Occupational Therapy Session Note  Patient Details  Name: Meagan Peck MRN: [id number] Date of Birth: April 16, 1961  Today's Date: 01/30/2021 OT Individual Time: 1157-2620 OT Individual Time Calculation (min): 41 min    Short Term Goals: Week 3:  OT Short Term Goal 1 (Week 3): Pt will roll R/L in bed with max A to facilitate dressing/bathing tasks at bed level OT Short Term Goal 2 (Week 3): Pt will perform self feeding with max A OT Short Term Goal 3 (Week 3): Pt will maintan sitting balance with max A+1 in preparation for BADLs and transfers  Skilled Therapeutic Interventions/Progress Updates:  Pt greeted supine in bed very emotional and reporting pain in RUE. Pt requesting to transfer to TIS this session therefore session focus on functional transfer training and bed mobility. Pt currently requires Total A to roll R<>L to check brief, pt was abel to initiate reaching for side rails and bend BLEs but ultimately required total A +2 to achieve side lying. Total  A to don pants from bed level. Pt required total A +2 to transition from supine>sitting. Pt required up to MOD A for static sitting balance. Pt completed SB transfer from EOB >TIS with total A +2. Pt continued to be emotional throughout session needing increased time and effort to verbalize pts needs. Pt left up in w/c with safety belt activated and call bell within reach.   Therapy Documentation Precautions:  Precautions Precautions: Fall Precaution Comments: R sided weakness, fall risk Restrictions Weight Bearing Restrictions: No   Pain: Pt with un rated pain during session in RUE; positioned RUE on pillow throughout session for support and provided rest breaks as needed.    Therapy/Group: Individual Therapy  Corinne Ports [facility] 01/30/2021, 8:55 AM

## 2021-01-30 NOTE — Progress Notes (Signed)
Speech Language Pathology Daily Session Note  Patient Details  Name: Meagan Peck MRN: [id number] Date of Birth: 13-Sep-1960  Today's Date: 01/30/2021 SLP Individual Time: 1001-1030 SLP Individual Time Calculation (min): 29 min  Short Term Goals: Week 3: SLP Short Term Goal 1 (Week 3): Patient will verbalize basic wants/needs and responses to questions at word level with maxA verbal cues for improving speech intelligibility. SLP Short Term Goal 2 (Week 3): Patient will tolerate trials of soft solids with SLP with minA cues for safety strategies. SLP Short Term Goal 3 (Week 3): SLP will determine if any benefit from basic level non-verbal communmication (communication board, etc) with modA.  Skilled Therapeutic Interventions: Pt seen for skilled ST with focus on speech and swallowing goals, pt up in TIS wheelchair and quite emotional and tearful throughout session stating "everything makes me upset". Pt endorses poor appetite, not liking magic cups, ice cream, or an Ensure flavors. Pt was agreeable to D2 pear trials with SLP.  Pt consuming with mild extended mastication, no overt s/s aspiration. Due to patient's emotional state, generalized weakness and dysarthria, intelligibility during conversation ~20% with max A cues for chunking ideas, speaking in 1-2 word phrases and breath support. SLP placing picture of pts dog Kiwi on phone for pt to look at, left in TIS wheelchair with alarm set and all needs within reach including soft touch call button. Cont ST POC.  Pain Pain Assessment Pain Scale: 0-10 Pain Score: 0-No pain Pain Location: Head Pain Orientation: Anterior Pain Descriptors / Indicators: Headache Pain Onset: On-going Patients Stated Pain Goal: 0 Pain Intervention(s): Medication (See eMAR) Multiple Pain Sites: No  Therapy/Group: Individual Therapy  Dewaine Conger 01/30/2021, 12:05 PM

## 2021-01-31 DIAGNOSIS — Z7189 Other specified counseling: Secondary | ICD-10-CM

## 2021-01-31 DIAGNOSIS — K7682 Hepatic encephalopathy: Secondary | ICD-10-CM

## 2021-01-31 DIAGNOSIS — K567 Ileus, unspecified: Secondary | ICD-10-CM

## 2021-01-31 LAB — AMMONIA: Ammonia: 38 umol/L — ABNORMAL HIGH (ref 9–35)

## 2021-01-31 LAB — GLUCOSE, CAPILLARY
Glucose-Capillary: 53 mg/dL — ABNORMAL LOW (ref 70–99)
Glucose-Capillary: 66 mg/dL — ABNORMAL LOW (ref 70–99)
Glucose-Capillary: 78 mg/dL (ref 70–99)
Glucose-Capillary: 136 mg/dL — ABNORMAL HIGH (ref 70–99)
Glucose-Capillary: 234 mg/dL — ABNORMAL HIGH (ref 70–99)
Glucose-Capillary: 77 mg/dL (ref 70–99)

## 2021-01-31 LAB — MAGNESIUM: Magnesium: 1.5 mg/dL — ABNORMAL LOW (ref 1.7–2.4)

## 2021-01-31 MED ORDER — INSULIN GLARGINE-YFGN 100 UNIT/ML ~~LOC~~ SOLN
20.0000 [IU] | Freq: Two times a day (BID) | SUBCUTANEOUS | Status: DC
  Administered 2021-01-31 – 2021-02-01 (×2): 20 [IU] via SUBCUTANEOUS
  Filled 2021-01-31 (×3): qty 0.2

## 2021-01-31 NOTE — Progress Notes (Signed)
Daily Progress Note   Patient Name: Meagan Peck       Date: 01/31/2021 DOB: 05-Oct-1960  Age: 60 y.o. MRN#: [id number] Attending Physician: Courtney Heys, MD Primary Care Physician: Pcp, No Admit Date: 01/12/2021  Reason for Consultation/Follow-up: Establishing goals of care  Subjective: Medical records reviewed. Called patient's daughter Luetta Nutting to continue ongoing goals of care conversation. She is understandably emotional as we discussed poor overall prognosis and the option of home hospice. Amber shares that the patient has recently expressed her desire to go home on several occasions. She feels that discharging home with hospice as soon as possible will be most consistent with patient's preferences.   Discussed the option of comfort focused care in the hospital. Amber tells me that her mother seems to do better emotionally when she is receiving therapies and consistent engagement. She would like to continue with current interventions until patient is able to go home with hospice.  Questions and concerns addressed. PMT will continue to support holistically.  Length of Stay: 19  Current Medications: Scheduled Meds:   (feeding supplement) PROSource Plus  30 mL Oral BID BM   cholecalciferol  1,000 Units Oral QHS   ciprofloxacin  500 mg Oral Q2000   ferrous sulfate  325 mg Oral BID WC   insulin aspart  0-9 Units Subcutaneous TID WC   insulin aspart  14 Units Subcutaneous TID WC   insulin glargine-yfgn  22 Units Subcutaneous BID   lactulose  60 g Oral QID   loratadine  10 mg Oral Daily   magnesium oxide  800 mg Oral BID   multivitamin with minerals  1 tablet Oral Daily   pantoprazole  40 mg Oral QAC breakfast   Ensure Max Protein  11 oz Oral BID   rifaximin  550 mg Oral BID    Continuous  Infusions:    PRN Meds: ondansetron **OR** ondansetron (ZOFRAN) IV            Vital Signs: BP (!) 125/59 (BP Location: Right Arm)   Pulse 98   Temp 99 F (37.2 C) (Oral)   Resp 18   Ht 4' 10"  (1.473 m)   Wt 59.7 kg   LMP 12/03/2013 (LMP Unknown)   SpO2 100%   BMI 27.51 kg/m  SpO2: SpO2: 100 % O2 Device: O2 Device: Room Air O2 Flow Rate:    Intake/output summary:  Intake/Output Summary (Last 24 hours) at 01/31/2021 8638 Last data filed at 01/31/2021 0700 Gross per 24 hour  Intake 220 ml  Output --  Net 220 ml    LBM: Last BM Date: 01/30/21 Baseline Weight: Weight: 62.2 kg Most recent weight: Weight: 59.7 kg   Patient Active Problem List   Diagnosis Date Noted   Expressive aphasia    Muscle tightness    Transaminitis    Hypoalbuminemia due to protein-calorie malnutrition ([place])    Spinal stenosis in cervical region    Acute on chronic anemia    Uncontrolled type 2 diabetes mellitus with hyperglycemia (H[facility])    Demyelinating disease of central nervous system, unspecified ([place]) 01/12/2021   Hepatic encephalopathy 01/08/2021   Liver cirrhosis secondary to NASH ([place]) 01/27/2020   Abnormal weight gain 01/27/2020  Anasarca 01/27/2020   Electrolyte disturbance 01/27/2020   Hospital discharge follow-up 01/27/2020   NASH (nonalcoholic steatohepatitis)    Portal hypertension ([place])    Angiodysplasia of upper gastrointestinal tract    SBP (spontaneous bacterial peritonitis) ([place]) 01/08/2020   Esophageal varices without bleeding ([place])    Hypertension associated with diabetes ([place]) 08/16/2019   Chest pain 11/27/2017   Vitamin D deficiency 05/15/2017   Iron deficiency 05/15/2017   Breast screening declined 05/15/2017   Colonoscopy refused 05/15/2017   Choroidal nevus of both eyes 07/18/2016   Corneal epithelial and basement membrane dystrophy 07/18/2016   Thrombocytopenia ([place]) 04/14/2016   Hyperlipidemia associated with type 2 diabetes mellitus ([place]) 05/25/2015    Gastroesophageal reflux disease 05/25/2015   Type 2 diabetes mellitus with other specified complication ([place]) 07/68/0881    Palliative Care Assessment & Plan   HPI: 60 y.o. female  with past medical history of [name] cirrhosis with previous hepatic encephalopathy maintained on chronic Cipro, diabetes mellitus, hypertension, hyperlipidemia, esophageal varices, and GERD admitted on 01/12/2021 to CIR following hospitalization 9/9-9/13 for AMS and slurred speech. During hospitalization, MRI showed striking abnormal diffusion, T2 and flair signal abnormality symmetrically affecting the pons with sparing of the bilateral cortical infarcts favoring osmotic demyelination syndrome. MRI cervical spine degenerative changes moderate to severe spinal stenosis C4-5 and moderate C5-6 and C6-7.  There was some mass-effect on the cord at these levels worse at C4-5 without cord signal abnormality. Patient with ongoing hyperammoniemia and receiving lactulose 5x daily. Patient not meeting goals in CIR and has shown functional decline over past week - requiring total assist for most activities. PMT consulted to discuss [place].   Assessment: Osmotic demyelination syndrome  NASH cirrhosis Cervical stenosis  Goals of care conversation  Recommendations/Plan: [name] daughter confirms family's goal is to discharge home with hospice services as soon as possible; appreciate TOC assistance with referral  Continue current interventions while patient is admitted Psychosocial and emotional support provided PMT remains available to family for support and acute needs  Code Status: DNR  Discharge Planning: home with hospice   Total time: 25 minutes Greater than 50% of this time was spent in counseling and coordinating care related to the above assessment and plan.  Dorthy Cooler, PA-C Palliative Medicine Team Team phone # [phone]  Thank you for allowing the Palliative Medicine Team to assist in the care of this  patient. Please utilize secure chat with additional questions, if there is no response within 30 minutes please call the above phone number.  Palliative Medicine Team providers are available by phone from 7am to 7pm daily and can be reached through the team cell phone.  Should this patient require assistance outside of these hours, please call the patient's attending physician.

## 2021-01-31 NOTE — Progress Notes (Signed)
[facility] PHYSICAL MEDICINE & REHABILITATION PROGRESS NOTE  Subjective/Complaints:   Pt said had multiple BM's last night/overnight.  Didn't sleep well as a result.   Tired but feels "more with it".   Will d/c IVFs  ROS: limited by cognition/dysarthria  Objective: Vital Signs: Blood pressure (!) 125/59, pulse 98, temperature 99 F (37.2 C), temperature source Oral, resp. rate 18, height 4' 10"  (1.473 m), weight 59.7 kg, last menstrual period 12/03/2013, SpO2 100 %. No results found. Recent Labs    01/29/21 0510  WBC 7.5  HGB 10.1*  HCT 27.2*  PLT 127*     Recent Labs    01/29/21 0510  NA 139  K 3.7  CL 110  CO2 22  GLUCOSE 74  BUN 20  CREATININE 0.83  CALCIUM 8.4*      Intake/Output Summary (Last 24 hours) at 01/31/2021 0946 Last data filed at 01/31/2021 0700 Gross per 24 hour  Intake 220 ml  Output --  Net 220 ml        Physical Exam: BP (!) 125/59 (BP Location: Right Arm)   Pulse 98   Temp 99 F (37.2 C) (Oral)   Resp 18   Ht 4' 10"  (1.473 m)   Wt 59.7 kg   LMP 12/03/2013 (LMP Unknown)   SpO2 100%   BMI 27.51 kg/m       General: awake, alert, appropriate, more awake, but can tell she feels tired;  NAD HENT: conjugate gaze; oropharynx moist CV: regular rate- borderline tachycardia; no JVD Pulmonary: CTA B/L; no W/R/R- good air movement GI: soft, NT, ND, (+)BS- normoactive Psychiatric: appropriate but crying intermittently.  Neurological: dysarthric; more understandable today; more with it.  RUE trace in grip and biceps- otherwise 0/5  Ext: no clubbing, cyanosis, or edema Psych: flat but cooperative, delayed  Skin: No evidence of breakdown, no evidence of rash  Neuro: Alert, dysconjugate gaze. Follows basic commands. Dysarthric, delayed. Motor: RUE: 0/5 proximal distal,  RLE: 0/5 proximal distal- no change today on R side ~ 3+/5 on L side Sensation diminished to light touch right hand, stable MSK- pain right shoulder with IR. Heel  cords tight   Assessment/Plan: 1. Functional deficits which require 3+ hours per day of interdisciplinary therapy in a comprehensive inpatient rehab setting. Physiatrist is providing close team supervision and 24 hour management of active medical problems listed below. Physiatrist and rehab team continue to assess barriers to discharge/monitor patient progress toward functional and medical goals   Care Tool:  Bathing    Body parts bathed by patient: Chest, Abdomen   Body parts bathed by helper: Right arm, Left arm, Front perineal area, Buttocks, Right upper leg, Left upper leg, Right lower leg, Left lower leg, Face     Bathing assist Assist Level: Total Assistance - Patient < 25%     Upper Body Dressing/Undressing Upper body dressing   What is the patient wearing?: Pull over shirt    Upper body assist Assist Level: Total Assistance - Patient < 25%    Lower Body Dressing/Undressing Lower body dressing      What is the patient wearing?: Pants     Lower body assist Assist for lower body dressing: Total Assistance - Patient < 25%     Toileting Toileting    Toileting assist Assist for toileting: Maximal Assistance - Patient 25 - 49%     Transfers Chair/bed transfer  Transfers assist     Chair/bed transfer assist level: 2 Helpers (total A +2 SB)  Locomotion Ambulation   Ambulation assist   Ambulation activity did not occur: Safety/medical concerns (pt became emotional, did not have time for further mobility)  Assist level: Minimal Assistance - Patient > 75% Assistive device: Walker-rolling Max distance: 50'   Walk 10 feet activity   Assist  Walk 10 feet activity did not occur: Safety/medical concerns  Assist level: Minimal Assistance - Patient > 75% Assistive device: Walker-rolling   Walk 50 feet activity   Assist Walk 50 feet with 2 turns activity did not occur: Safety/medical concerns  Assist level: Minimal Assistance - Patient >  75% Assistive device: Walker-rolling    Walk 150 feet activity   Assist Walk 150 feet activity did not occur: Safety/medical concerns         Walk 10 feet on uneven surface  activity   Assist Walk 10 feet on uneven surfaces activity did not occur: Safety/medical concerns         Wheelchair     Assist Is the patient using a wheelchair?: Yes Type of Wheelchair: Manual Wheelchair activity did not occur: Safety/medical concerns  Wheelchair assist level: Dependent - Patient 0%      Wheelchair 50 feet with 2 turns activity    Assist    Wheelchair 50 feet with 2 turns activity did not occur: Safety/medical concerns       Wheelchair 150 feet activity     Assist  Wheelchair 150 feet activity did not occur: Safety/medical concerns        Medical Problem List and Plan: 1.  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  Appreciate neurology follow up. No new neuro intereventions recommended -Continue CIR- PT, OT and SLP -called Neuro and they feel hyperammoniemia is cause of getting weaker- trying to fix this.   9/29- has ileus which is complicating   3/29- ileus improving, but still not having multiple Bms/day.   10/2- con't Qday therapy- goal of d/c sooner than later- will speak with pt's family this week and see when can do family training.  2.  Antithrombotics: -DVT/anticoagulation: SCDs 9/28- Plts 127- con't  to monitor  Pharmaceutical: Other (comment)             -antiplatelet therapy: N/A 3. Pain Management: Tylenol d/ced given hepatic failure neurogenic pain over chest and abd bilateral , has some signal abnormality in thalamus bilaterally   Baclofen 5 BID started on  9/20, changed to nightly on 9/21 due to lethargy, d/ced on 9/23 d/t lethargy 4. Mood: Provide emotional support             -antipsychotic agents: N/A 5. Neuropsych: This patient is not  Fully capable of making decisions on her own behalf. Some emotional lability as expected given extensive brain stem lesion   9/27- will call palliative care to discuss pt's care. 9/28- consulted palliative care- spoke to them- wants ot determine goals of care.  9/29- they attempted to see, but couldn't speak with pt and couldn't reach husband. Willc on't to try  9/30- pallative care saw pt- made DNR- she wants ot go home- had pt see Neuropsych today as well.  6. Skin/Wound Care: Routine skin checks 7. Fluids/Electrolytes/Nutrition:   I personally reviewed the patient's labs today.   8.  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  Neurology  follow-up conservative care  9/29- spoke with Neuro- they think it's made worse by high ammonia- Ammonia 113 today- will try to get her to go/have multiple BM's- complicated by ileus.   9/30- Ammonia 118- working on increasing lactulose. 10/2- Ammonia down to 38- MUCH better- and she looks better/less dysarthric; no change in strength that I can see, though-  9.  Uncontrolled diabetes mellitus with hyperglycemia.  Hemoglobin A1c 10.7.   NovoLog 12 units 3 times daily, increase to 14 units on 9/16 Semglee 20 units twice daily.  Diabetic teaching- couldn't afford insulin at home.  CBG (last 3)  Recent Labs    01/31/21 0605 01/31/21 0640 01/31/21 0702  GLUCAP 53* 66* 78  9/26 increase semglee to 22u bid  9/30- BG's controlled- con't regimen  10/2- Bgs 50s-60s this AM- will decrease Semglee to 20 units BID 10.  Acute on chronic anemia.  Continue iron supplement.    Hemoglobin 11.1 on 9/19--> 10.6 9/26  F/u Monday 11. Constipation in setting of cirrhosis: Cont chronulac 20m 5x daily  9/27- LBM yesterday small x1- will give sorbitol x1 today and con't lactulose-  also on Rifaxamin.  9/30- ileus resolving- will increase Lactulose 12. Hiccups-?  Resolved 13. Thrombocytopenia  Platelets 106 on 9/19--> 127k 9/26  Continue to monitor 14.  Cervical stenosis  MRI showing multiple abnormalities  Appreciate neurosurgery recs, no plans for intervention at present 15.  NASH Cirrhosis    mild elevation of AST--> sl trend up 9/26 however--f/u later this week  10/2- labs tomorrow 16.  Hypoalbuminemia  Supplement initiated  17.  Hyperammonemia  Ammonia 121 on 9/22---> up to 145 9/26  -already receiving lactulose 367m5 x daily  -will ask GI for further recs to treat  9/28- will check KUB as to why cannot get her to poop- and get cleaned out.   9/30- Ammonia 118- will increase lactulose  10/2- Ammonia down to 38- no change in strength-  18.  Bilateral ankle heel cord tightness add PRAFO   9/27- con't PRAFOs- pt appears to be getting weaker? Will d/w therapy.  19. Ileus  9/29- will change to clear liquid diet and IVFs 75cc/hour x 1 days and give Sorbitol and enema to try and get her moving- making high ammonia worse  10/2- will stop IVFs and change back to D1 thins diet.  20. Hypomagnesemia  9/29- will give MgOx 800 mg BID and recheck in AM- might need IV Mg.   9/30- will give IV Mg and recheck levels Sunday. Can give IV 4G per d/w pharmacy  10/2- will recechk in AM- Mg 1.5  LOS: 19 days A FACE TO FACE EVALUATION WAS PERFORMED  Meagan Peck 01/31/2021, 9:46 AM

## 2021-01-31 NOTE — Progress Notes (Signed)
Hypoglycemic Event  CBG: 53  Treatment: 8 oz orange juice  Symptoms: Asymptomatic  Follow-up CBG: Time: 0620 CBG Result:66  Possible Reasons for Event: unknown   Comments/MD notified: MD/Charge Notified     Elige Ko

## 2021-01-31 NOTE — Progress Notes (Signed)
Hypoglycemic Event  CBG: 66  Treatment: 8 oz orange juice  Symptoms: Asymptomatic  Follow-up CBG: Time:0755 CBG Result:78  Possible Reasons for Event: unknown   Comments/MD notified:MD/Charge nurse notified    Meagan Peck

## 2021-01-31 NOTE — Progress Notes (Signed)
Speech Language Pathology Daily Session Note  Patient Details  Name: Meagan Peck MRN: [id number] Date of Birth: Sep 05, 1960  Today's Date: 01/31/2021 SLP Individual Time: 1018-1100 SLP Individual Time Calculation (min): 42 min  Short Term Goals: Week 3: SLP Short Term Goal 1 (Week 3): Patient will verbalize basic wants/needs and responses to questions at word level with maxA verbal cues for improving speech intelligibility. SLP Short Term Goal 2 (Week 3): Patient will tolerate trials of soft solids with SLP with minA cues for safety strategies. SLP Short Term Goal 3 (Week 3): SLP will determine if any benefit from basic level non-verbal communmication (communication board, etc) with modA.  Skilled Therapeutic Interventions:Skilled ST services focused on swallow and speech skills. Pt was crying upon entering room and eventually expressing needing brief change with mod A verbal cues to speak one word per breath. SLP and NT assisted with brief change in bed. Pt was attempted to express something about stomach being upset, was able to spell word out on alphabet board but required biofeedback of written selected words to confirm intended message. Pt consumed requested ice cream and thin liquids via straw snack with total A feeding and slight swallow initiation delay and no overt s/s aspiration. Daughter arrived as SLP was leaving, SLP provided education pertaining to use of white board, having pt spell out/dictate word during communication breakdown. Pt was left in room with daughter, call bell within reach and bed alarm set. SLP recommends to continue skilled services.      Pain Pain Assessment Pain Score: 0-No pain  Therapy/Group: Individual Therapy  Jenner Rosier  [facility] 01/31/2021, 4:18 PM

## 2021-01-31 NOTE — Progress Notes (Signed)
Patient refused lactulose due to making her have multiple bowel movements. Patient presents crying and stating that he behind hurts. Patient presents with hyperactive bowel sounds. Sanda Linger, LPN

## 2021-01-31 NOTE — Progress Notes (Signed)
Occupational Therapy Session Note  Patient Details  Name: Meagan Peck MRN: [id number] Date of Birth: 1960/05/13  Today's Date: 01/31/2021 OT Individual Time: 1345-1425 OT Individual Time Calculation (min): 40 min    Short Term Goals: Week 3:  OT Short Term Goal 1 (Week 3): Pt will roll R/L in bed with max A to facilitate dressing/bathing tasks at bed level OT Short Term Goal 2 (Week 3): Pt will perform self feeding with max A OT Short Term Goal 3 (Week 3): Pt will maintan sitting balance with max A+1 in preparation for BADLs and transfers  Skilled Therapeutic Interventions/Progress Updates:    Pt resting in bed upon arrival with daughter present. Pt requested to change out of hospital scrubs and into her nightgown. Pt also c/o of being cold. Skin warm to touch. OT intervention with focus on bed mobility to facilitate changing brief and clothing. Pt incontinent of bowel X 2 (see Flowsheets). PT dependent for toileting tasks. Rolling R/L using bed rails with max A+2. Supine>sit in long sitting with HOB elevated with tot A+2. Pt initiates reaching for bed rail with LUE. Pt remained in bed with daughter at bedside. Bed alarm activitated. All needs within reach.  Therapy Documentation Precautions:  Precautions Precautions: Fall Precaution Comments: R sided weakness, fall risk Restrictions Weight Bearing Restrictions: No Pain:  Pt reports discomfort Rt triceps with stretching; repsitioned    Therapy/Group: Individual Therapy  Leroy Libman 01/31/2021, 2:30 PM

## 2021-01-31 NOTE — Plan of Care (Signed)
  Problem: Consults Goal: RH GENERAL PATIENT EDUCATION Description: See Patient Education module for education specifics. Outcome: Progressing Goal: Diabetes Guidelines if Diabetic/Glucose > 140 Description: If diabetic or lab glucose is > 140 mg/dl - Initiate Diabetes/Hyperglycemia Guidelines & Document Interventions  Outcome: Progressing   Problem: RH SKIN INTEGRITY Goal: RH STG MAINTAIN SKIN INTEGRITY WITH ASSISTANCE Description: STG Maintain Skin Integrity With Supervision Assistance. Outcome: Progressing   Problem: RH SAFETY Goal: RH STG DECREASED RISK OF FALL WITH ASSISTANCE Description: STG Decreased Risk of Fall With A Supervision ssistance. Outcome: Progressing   Problem: RH PAIN MANAGEMENT Goal: RH STG PAIN MANAGED AT OR BELOW PT'S PAIN GOAL Description: < 3 on a 0-10 pain scale. Outcome: Progressing   Problem: RH KNOWLEDGE DEFICIT GENERAL Goal: RH STG INCREASE KNOWLEDGE OF SELF CARE AFTER HOSPITALIZATION Description: Patient will demonstrate knowledge of medication management, pain management, diabetes management with educational materials and handouts provided by staff independently at discharge. Outcome: Progressing

## 2021-01-31 NOTE — Plan of Care (Signed)
  Problem: Consults Goal: RH GENERAL PATIENT EDUCATION Description: See Patient Education module for education specifics. 01/31/2021 1444 by Renda Rolls L, LPN Outcome: Progressing 01/31/2021 1442 by Renda Rolls L, LPN Outcome: Progressing Goal: Diabetes Guidelines if Diabetic/Glucose > 140 Description: If diabetic or lab glucose is > 140 mg/dl - Initiate Diabetes/Hyperglycemia Guidelines & Document Interventions  01/31/2021 1444 by Renda Rolls L, LPN Outcome: Progressing 01/31/2021 1442 by Renda Rolls L, LPN Outcome: Progressing   Problem: RH SKIN INTEGRITY Goal: RH STG MAINTAIN SKIN INTEGRITY WITH ASSISTANCE Description: STG Maintain Skin Integrity With Supervision Assistance. 01/31/2021 1444 by Renda Rolls L, LPN Outcome: Progressing 01/31/2021 1442 by Renda Rolls L, LPN Outcome: Progressing   Problem: RH SAFETY Goal: RH STG DECREASED RISK OF FALL WITH ASSISTANCE Description: STG Decreased Risk of Fall With A Supervision ssistance. 01/31/2021 1444 by Renda Rolls L, LPN Outcome: Progressing 01/31/2021 1442 by Renda Rolls L, LPN Outcome: Progressing   Problem: RH PAIN MANAGEMENT Goal: RH STG PAIN MANAGED AT OR BELOW PT'S PAIN GOAL Description: < 3 on a 0-10 pain scale. 01/31/2021 1444 by Renda Rolls L, LPN Outcome: Progressing 01/31/2021 1442 by Sanda Linger, LPN Outcome: Progressing   Problem: RH KNOWLEDGE DEFICIT GENERAL Goal: RH STG INCREASE KNOWLEDGE OF SELF CARE AFTER HOSPITALIZATION Description: Patient will demonstrate knowledge of medication management, pain management, diabetes management with educational materials and handouts provided by staff independently at discharge. 01/31/2021 1444 by Renda Rolls L, LPN Outcome: Progressing 01/31/2021 1442 by Sanda Linger, LPN Outcome: Progressing

## 2021-01-31 NOTE — Progress Notes (Signed)
Physical Therapy Session Note  Patient Details  Name: Meagan Peck MRN: [id number] Date of Birth: [birth year]  Today's Date: 01/31/2021 PT Individual Time: 0804-0828 PT Individual Time Calculation (min): 24 min   Short Term Goals: Week 3:  PT Short Term Goal 1 (Week 3): Pt will perform supine to/from sit with assist x 1 consistently PT Short Term Goal 2 (Week 3): Pt will perform least restrictive transfer with assist x 1 consistently PT Short Term Goal 3 (Week 3): Pt will tolerate sitting OOB in chair x 1 hour  Skilled Therapeutic Interventions/Progress Updates:     Patient in bed on bed pan with NT in the room upon PT arrival. Patient alert and agreeable to PT session. Patient reported un-rated R upper extremity and abdominal pain during session, RN made aware. PT provided repositioning, rest breaks, and distraction as pain interventions throughout session.   Therapeutic Activity: Bed Mobility: Patient performed rolling R/L x3 with max A +2 x2 progressing to max A +1 x1 to remove bed pan, perform peri-care, and don paper scrub pants with total A. Provided verbal cues for cross body reach with opposite arm and knee and turning head to roll and maintain side-lying. Patient able to lift B lower extremities off the bed to don pants. Patient verbalized throughout session with mod cues for enunciation for improved intelligibility. Offered to have patient sit EOB to eat breakfast. Patient declined due to fatigue and abdominal pain. Patient performed scooting up in bed with bed in trendelenburg position with total-max A +1 using B feet to push up to assist. Sat patient up to 30 deg in bed. Offered all items on breakfast tray. Patient refused all items, did report that she would eat orange sherbert in a little while. NT made aware, suggested trying orange magic cup for increased caloric and protein intake. Patient declined further intervention at this time, requesting to rest due to abdominal pain and  fatigue. Patient missed 21 min of skilled PT due to pain/fatigue, RN made aware. Will attempt to make-up missed time as able.    Patient in bed at end of session with breaks locked, bed alarm set, and all needs within reach.   Therapy Documentation Precautions:  Precautions Precautions: Fall Precaution Comments: R sided weakness, fall risk Restrictions Weight Bearing Restrictions: No General: PT Amount of Missed Time (min): 21 Minutes PT Missed Treatment Reason: Pain;Patient fatigue    Therapy/Group: Individual Therapy  Meagan Peck PT, DPT  01/31/2021, 8:31 AM

## 2021-02-01 ENCOUNTER — Ambulatory Visit: Payer: Self-pay | Admitting: Gastroenterology

## 2021-02-01 LAB — CBC WITH DIFFERENTIAL/PLATELET
Abs Immature Granulocytes: 0.02 10*3/uL (ref 0.00–0.07)
Basophils Absolute: 0 10*3/uL (ref 0.0–0.1)
Basophils Relative: 1 %
Eosinophils Absolute: 0.2 10*3/uL (ref 0.0–0.5)
Eosinophils Relative: 4 %
HCT: 24.2 % — ABNORMAL LOW (ref 36.0–46.0)
Hemoglobin: 8.9 g/dL — ABNORMAL LOW (ref 12.0–15.0)
Immature Granulocytes: 0 %
Lymphocytes Relative: 24 %
Lymphs Abs: 1.2 10*3/uL (ref 0.7–4.0)
MCH: 35.5 pg — ABNORMAL HIGH (ref 26.0–34.0)
MCHC: 36.8 g/dL — ABNORMAL HIGH (ref 30.0–36.0)
MCV: 96.4 fL (ref 80.0–100.0)
Monocytes Absolute: 0.5 10*3/uL (ref 0.1–1.0)
Monocytes Relative: 9 %
Neutro Abs: 3 10*3/uL (ref 1.7–7.7)
Neutrophils Relative %: 62 %
Platelets: 115 10*3/uL — ABNORMAL LOW (ref 150–400)
RBC: 2.51 MIL/uL — ABNORMAL LOW (ref 3.87–5.11)
RDW: 16.6 % — ABNORMAL HIGH (ref 11.5–15.5)
WBC: 4.9 10*3/uL (ref 4.0–10.5)
nRBC: 0 % (ref 0.0–0.2)

## 2021-02-01 LAB — COMPREHENSIVE METABOLIC PANEL
ALT: 99 U/L — ABNORMAL HIGH (ref 0–44)
AST: 154 U/L — ABNORMAL HIGH (ref 15–41)
Albumin: 1.9 g/dL — ABNORMAL LOW (ref 3.5–5.0)
Alkaline Phosphatase: 111 U/L (ref 38–126)
Anion gap: 6 (ref 5–15)
BUN: 10 mg/dL (ref 6–20)
CO2: 22 mmol/L (ref 22–32)
Calcium: 8.1 mg/dL — ABNORMAL LOW (ref 8.9–10.3)
Chloride: 111 mmol/L (ref 98–111)
Creatinine, Ser: 0.62 mg/dL (ref 0.44–1.00)
GFR, Estimated: >60 mL/min (ref >60)
Glucose, Bld: 59 mg/dL — ABNORMAL LOW (ref 70–99)
Potassium: 3.1 mmol/L — ABNORMAL LOW (ref 3.5–5.1)
Sodium: 139 mmol/L (ref 135–145)
Total Bilirubin: 3.3 mg/dL — ABNORMAL HIGH (ref 0.3–1.2)
Total Protein: 4.1 g/dL — ABNORMAL LOW (ref 6.5–8.1)

## 2021-02-01 LAB — GLUCOSE, CAPILLARY
Glucose-Capillary: 51 mg/dL — ABNORMAL LOW (ref 70–99)
Glucose-Capillary: 72 mg/dL (ref 70–99)
Glucose-Capillary: 143 mg/dL — ABNORMAL HIGH (ref 70–99)
Glucose-Capillary: 165 mg/dL — ABNORMAL HIGH (ref 70–99)
Glucose-Capillary: 126 mg/dL — ABNORMAL HIGH (ref 70–99)

## 2021-02-01 LAB — MAGNESIUM: Magnesium: 1.4 mg/dL — ABNORMAL LOW (ref 1.7–2.4)

## 2021-02-01 MED ORDER — POTASSIUM CHLORIDE CRYS ER 20 MEQ PO TBCR
40.0000 meq | EXTENDED_RELEASE_TABLET | Freq: Two times a day (BID) | ORAL | Status: AC
  Administered 2021-02-01 (×2): 40 meq via ORAL
  Filled 2021-02-01 (×2): qty 2

## 2021-02-01 MED ORDER — MAGNESIUM SULFATE 4 GM/100ML IV SOLN
4.0000 g | Freq: Once | INTRAVENOUS | Status: AC
  Administered 2021-02-01: 4 g via INTRAVENOUS
  Filled 2021-02-01: qty 100

## 2021-02-01 MED ORDER — INSULIN GLARGINE-YFGN 100 UNIT/ML ~~LOC~~ SOLN
18.0000 [IU] | Freq: Two times a day (BID) | SUBCUTANEOUS | Status: DC
  Administered 2021-02-01 – 2021-02-02 (×2): 18 [IU] via SUBCUTANEOUS
  Filled 2021-02-01 (×4): qty 0.18

## 2021-02-01 NOTE — Progress Notes (Signed)
Occupational Therapy Session Note  Patient Details  Name: Meagan Peck MRN: [id number] Date of Birth: 1960/06/05  Today's Date: 02/01/2021 OT Individual Time: 1030-1045 OT Individual Time Calculation (min): 15 min    Short Term Goals: Week 3:  OT Short Term Goal 1 (Week 3): Pt will roll R/L in bed with max A to facilitate dressing/bathing tasks at bed level OT Short Term Goal 2 (Week 3): Pt will perform self feeding with max A OT Short Term Goal 3 (Week 3): Pt will maintan sitting balance with max A+1 in preparation for BADLs and transfers  Skilled Therapeutic Interventions/Progress Updates:  Patient met lying supine in bed in agreement with OT treatment session. 0/10 pain reported at rest but patient c/o pain in R shoulder with bed mobility. Patient with continued emotional lability with several episodes of crying during short 15 minute session. Session with focus on bed mobility with patient rolling to L with Max A. Max A at trunk with HOB slightly elevated to transition to EOB. Patient requires heavy assist to maintain static sitting balance this date. Attempted weight bearing on R elbow and L elbow in prep for placement of slideboard prior to transfers. Max A for return to supine and +2 assist for supine scoot to [facility] with bed in reverse trendelenburg and use of chuck pad. IV team present to place new PIV. Patient missed 15 minutes of treatment session.   Therapy Documentation Precautions:  Precautions Precautions: Fall Precaution Comments: R sided weakness, fall risk Restrictions Weight Bearing Restrictions: No General: General OT Amount of Missed Time: 15 Minutes  Therapy/Group: Individual Therapy  Nickholas Goldston R Howerton-Davis 02/01/2021, 11:49 AM

## 2021-02-01 NOTE — Progress Notes (Signed)
Physical Therapy Session Note  Patient Details  Name: Meagan Peck MRN: [id number] Date of Birth: Jul 09, 1960  Today's Date: 02/01/2021 PT Individual Time: 0915-1015 PT Individual Time Calculation (min): 60 min   Short Term Goals: Week 3:  PT Short Term Goal 1 (Week 3): Pt will perform supine to/from sit with assist x 1 consistently PT Short Term Goal 2 (Week 3): Pt will perform least restrictive transfer with assist x 1 consistently PT Short Term Goal 3 (Week 3): Pt will tolerate sitting OOB in chair x 1 hour  Skilled Therapeutic Interventions/Progress Updates:    Pt received seated in bed, agreeable to PT session. No complaints of pain this AM. Pt reports feeling slightly better this AM and speech more intelligible. [name] present during session as well and reports pt appears to be doing slightly better this date as well. Pt agreeable to get up TIS chair for duration of session. Supine to sit with max A x 2, HOB elevated and use of bedrail. Pt requires max A to maintain sitting balance EOB. Slide board transfer bed to w/c with max to total A x 2, 4" step under BLE. Pt able to tolerate sitting up in w/c x 30 min this session while therapist and tech assist pt with washing her hair. Attempt to have pt reach up with LUE to assist with washing and brushing out her hair, pt unable to lift LUE enough to assist. Slide board transfer back to bed max to total A x 2. Sitting balance EOB with max A while therapist brushes out pt's hair and braids it for her. Pt returns to supine with total A x 2 for trunk control and BLE management. Pt left semi-reclined in bed with soft-touch call button in place under L hand, bed alarm in place, daughter present.  Therapy Documentation Precautions:  Precautions Precautions: Fall Precaution Comments: R sided weakness, fall risk Restrictions Weight Bearing Restrictions: No     Therapy/Group: Individual Therapy   Excell Seltzer, PT, DPT,  CSRS  02/01/2021, 12:08 PM

## 2021-02-01 NOTE — Plan of Care (Signed)
  Problem: Consults Goal: RH GENERAL PATIENT EDUCATION Description: See Patient Education module for education specifics. Outcome: Progressing Goal: Diabetes Guidelines if Diabetic/Glucose > 140 Description: If diabetic or lab glucose is > 140 mg/dl - Initiate Diabetes/Hyperglycemia Guidelines & Document Interventions  Outcome: Progressing   Problem: RH SKIN INTEGRITY Goal: RH STG MAINTAIN SKIN INTEGRITY WITH ASSISTANCE Description: STG Maintain Skin Integrity With Supervision Assistance. Outcome: Progressing   Problem: RH SAFETY Goal: RH STG DECREASED RISK OF FALL WITH ASSISTANCE Description: STG Decreased Risk of Fall With A Supervision ssistance. Outcome: Progressing   Problem: RH PAIN MANAGEMENT Goal: RH STG PAIN MANAGED AT OR BELOW PT'S PAIN GOAL Description: < 3 on a 0-10 pain scale. Outcome: Progressing   Problem: RH KNOWLEDGE DEFICIT GENERAL Goal: RH STG INCREASE KNOWLEDGE OF SELF CARE AFTER HOSPITALIZATION Description: Patient will demonstrate knowledge of medication management, pain management, diabetes management with educational materials and handouts provided by staff independently at discharge. Outcome: Progressing

## 2021-02-01 NOTE — Progress Notes (Signed)
[facility] PHYSICAL MEDICINE & REHABILITATION PROGRESS NOTE  Subjective/Complaints:   Pt has refused lactulose 2-3x due to raw buttocks and sore butt.   Pt reports overall feeling stronger this AM.  BG dropped to 51 this AM- reduced Semglee to 18 units BID.   ROS: limited by dysarthria, cognition  Objective: Vital Signs: Blood pressure 135/78, pulse 75, temperature (!) 97.5 F (36.4 C), temperature source Oral, resp. rate 18, height 4' 10"  (1.473 m), weight 59.7 kg, last menstrual period 12/03/2013, SpO2 100 %. No results found. Recent Labs    02/01/21 0514  WBC 4.9  HGB 8.9*  HCT 24.2*  PLT 115*     Recent Labs    02/01/21 0514  NA 139  K 3.1*  CL 111  CO2 22  GLUCOSE 59*  BUN 10  CREATININE 0.62  CALCIUM 8.1*      Intake/Output Summary (Last 24 hours) at 02/01/2021 0851 Last data filed at 01/31/2021 1746 Gross per 24 hour  Intake 80 ml  Output --  Net 80 ml        Physical Exam: BP 135/78 (BP Location: Right Arm)   Pulse 75   Temp (!) 97.5 F (36.4 C) (Oral)   Resp 18   Ht 4' 10"  (1.473 m)   Wt 59.7 kg   LMP 12/03/2013 (LMP Unknown)   SpO2 100%   BMI 27.51 kg/m        General: awake, alert, appropriate, supine in bed; brighter affect; NAD HENT: conjugate gaze; oropharynx moist CV: regular rate; no JVD Pulmonary: CTA B/L; no W/R/R- good air movement GI: soft, NT, ND, (+)BS; flat Psychiatric: appropriate- more interactive; less tearful Neurological: alert; dysarthric- no improvement; however RUE- 2+/5 and RLE 3/5 in DF and PF- which is MUCH better   Ext: no clubbing, cyanosis, or edema Psych: flat but cooperative, delayed  Skin: No evidence of breakdown, no evidence of rash  Neuro: Alert, dysconjugate gaze. Follows basic commands. Dysarthric, delayed. Motor: RUE: 0/5 proximal distal,  RLE: 0/5 proximal distal- no change today on R side ~ 3+/5 on L side Sensation diminished to light touch right hand, stable MSK- pain right shoulder  with IR. Heel cords tight   Assessment/Plan: 1. Functional deficits which require 3+ hours per day of interdisciplinary therapy in a comprehensive inpatient rehab setting. Physiatrist is providing close team supervision and 24 hour management of active medical problems listed below. Physiatrist and rehab team continue to assess barriers to discharge/monitor patient progress toward functional and medical goals   Care Tool:  Bathing    Body parts bathed by patient: Chest, Abdomen   Body parts bathed by helper: Right arm, Left arm, Front perineal area, Buttocks, Right upper leg, Left upper leg, Right lower leg, Left lower leg, Face     Bathing assist Assist Level: Total Assistance - Patient < 25%     Upper Body Dressing/Undressing Upper body dressing   What is the patient wearing?: Pull over shirt    Upper body assist Assist Level: Total Assistance - Patient < 25%    Lower Body Dressing/Undressing Lower body dressing      What is the patient wearing?: Pants     Lower body assist Assist for lower body dressing: Total Assistance - Patient < 25%     Toileting Toileting    Toileting assist Assist for toileting: Maximal Assistance - Patient 25 - 49%     Transfers Chair/bed transfer  Transfers assist     Chair/bed transfer assist level:  2 Helpers (total A +2 SB)     Locomotion Ambulation   Ambulation assist   Ambulation activity did not occur: Safety/medical concerns (pt became emotional, did not have time for further mobility)  Assist level: Minimal Assistance - Patient > 75% Assistive device: Walker-rolling Max distance: 50'   Walk 10 feet activity   Assist  Walk 10 feet activity did not occur: Safety/medical concerns  Assist level: Minimal Assistance - Patient > 75% Assistive device: Walker-rolling   Walk 50 feet activity   Assist Walk 50 feet with 2 turns activity did not occur: Safety/medical concerns  Assist level: Minimal Assistance -  Patient > 75% Assistive device: Walker-rolling    Walk 150 feet activity   Assist Walk 150 feet activity did not occur: Safety/medical concerns         Walk 10 feet on uneven surface  activity   Assist Walk 10 feet on uneven surfaces activity did not occur: Safety/medical concerns         Wheelchair     Assist Is the patient using a wheelchair?: Yes Type of Wheelchair: Manual Wheelchair activity did not occur: Safety/medical concerns  Wheelchair assist level: Dependent - Patient 0%      Wheelchair 50 feet with 2 turns activity    Assist    Wheelchair 50 feet with 2 turns activity did not occur: Safety/medical concerns       Wheelchair 150 feet activity     Assist  Wheelchair 150 feet activity did not occur: Safety/medical concerns        Medical Problem List and Plan: 1.  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  Appreciate neurology follow up. No new neuro intereventions recommended -Continue CIR- PT, OT and SLP -called Neuro and they feel hyperammoniemia is cause of getting weaker- trying to fix this.   9/29- has ileus which is complicating   6/20- ileus improving, but still not having multiple Bms/day.   10/2- con't Qday therapy- goal of d/c sooner than later- will speak with pt's family this week and see when can do family training.   10/3- con't PT and OT- has improvement in strength-will let therapy know.  2.  Antithrombotics: -DVT/anticoagulation: SCDs 9/28- Plts 127- con't  to monitor  Pharmaceutical: Other (comment)             -antiplatelet therapy: N/A 3. Pain Management: Tylenol d/ced given hepatic failure neurogenic pain over chest and abd  bilateral , has some signal abnormality in thalamus bilaterally   Baclofen 5 BID started on 9/20, changed to nightly on 9/21 due to lethargy, d/ced on 9/23 d/t lethargy 4. Mood: Provide emotional support             -antipsychotic agents: N/A 5. Neuropsych: This patient is not  Fully capable of making decisions on her own behalf. Some emotional lability as expected given extensive brain stem lesion   9/27- will call palliative care to discuss pt's care. 9/28- consulted palliative care- spoke to them- wants ot determine goals of care.  9/29- they attempted to see, but couldn't speak with pt and couldn't reach husband. Willc on't to try  9/30- pallative care saw pt- made DNR- she wants ot go home- had pt see Neuropsych today as well.  6. Skin/Wound Care: Routine skin  checks 7. Fluids/Electrolytes/Nutrition:   I personally reviewed the patient's labs today.   8.  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  Neurology follow-up conservative care  9/29- spoke with Neuro- they think it's made worse by high ammonia- Ammonia 113 today- will try to get her to go/have multiple BM's- complicated by ileus.   9/30- Ammonia 118- working on increasing lactulose. 10/2- Ammonia down to 38- MUCH better- and she looks better/less dysarthric; no change in strength that I can see, though- 10/3- strength much better in RUE/RLE this AM- will con't lactulose and Rafaxamin.   9.  Uncontrolled diabetes mellitus with hyperglycemia.  Hemoglobin A1c 10.7.   NovoLog 12 units 3 times daily, increase to 14 units on 9/16 Semglee 20 units twice daily.  Diabetic teaching- couldn't afford insulin at home.  CBG (last 3)  Recent Labs    01/31/21 2109 02/01/21 0606 02/01/21 0636  GLUCAP 77 51* 72  9/26 increase semglee to 22u bid  9/30- BG's controlled- con't regimen  10/2- Bgs 50s-60s this AM- will decrease Semglee to 20 units BID  10/3- will reduce Semglee to 18 units BID.  10.  Acute on chronic anemia.  Continue  iron supplement.    Hemoglobin 11.1 on 9/19--> 10.6 9/26  F/u Monday 11. Constipation in setting of cirrhosis: Cont chronulac 67m 5x daily  9/27- LBM yesterday small x1- will give sorbitol x1 today and con't lactulose- also on Rifaxamin.  9/30- ileus resolving- will increase Lactulose 12. Hiccups-?  Resolved 13. Thrombocytopenia  Platelets 106 on 9/19--> 127k 9/26  Continue to monitor 14.  Cervical stenosis  MRI showing multiple abnormalities  Appreciate neurosurgery recs, no plans for intervention at present 15.  NASH Cirrhosis    mild elevation of AST--> sl trend up 9/26 however--f/u later this week  10/2- labs tomorrow  10/3- Labs up more today 154 and 99- will recheck Thursday and if going up, will call GI.  16.  Hypoalbuminemia  Supplement initiated  17.  Hyperammonemia  Ammonia 121 on 9/22---> up to 145 9/26  -already receiving lactulose 368m5 x daily  -will ask GI for further recs to treat  9/28- will check KUB as to why cannot get her to poop- and get cleaned out.   9/30- Ammonia 118- will increase lactulose  10/2- Ammonia down to 38- no change in strength-   10/3- strength better today! 18.  Bilateral ankle heel cord tightness add PRAFO   9/27- con't PRAFOs- pt appears to be getting weaker? Will d/w therapy.  19. Ileus  9/29- will change to clear liquid diet and IVFs 75cc/hour x 1 days and give Sorbitol and enema to try and get her moving- making high ammonia worse  10/2- will stop IVFs and change back to D1 thins diet.  20. Hypomagnesemia  9/29- will give MgOx 800 mg BID and recheck in AM- might need IV Mg.   9/30- will give IV Mg and recheck levels Sunday. Can give IV 4G per d/w pharmacy  10/2- will recechk in AM- Mg 1.5  10/3- Mg 1.4- will repeat IV MG. Labs in AM 11. Hypokalemia  10/3- will replete KCL 40 mEq x2 and recheck in AM  LOS: 20 days A FACE TO FACE EVALUATION WAS PERFORMED  Keylee Shrestha 02/01/2021, 8:51 AM

## 2021-02-01 NOTE — Progress Notes (Signed)
Hypoglycemic Event  CBG: 51  Treatment: 4 oz juice/soda  Symptoms: None  Follow-up CBG: Time:0636  CBG Result:72  Possible Reasons for Event: Unknown  Comments/MD notified:Notified charge nurse/MD    Elige Ko

## 2021-02-01 NOTE — Progress Notes (Signed)
Patient continues to refuse lactulose due to increased bowel movements. PA made aware. K pad ordered for increased pain to back and buttocks area. Patient states that she has some relief with pad since placed this afternoon. Sanda Linger, LPN

## 2021-02-01 NOTE — Progress Notes (Signed)
Speech Language Pathology Daily Session Note  Patient Details  Name: Meagan Peck MRN: [id number] Date of Birth: April 20, 1961  Today's Date: 02/01/2021 SLP Individual Time: 1200-1230 SLP Individual Time Calculation (min): 30 min  Short Term Goals: Week 3: SLP Short Term Goal 1 (Week 3): Patient will verbalize basic wants/needs and responses to questions at word level with maxA verbal cues for improving speech intelligibility. SLP Short Term Goal 2 (Week 3): Patient will tolerate trials of soft solids with SLP with minA cues for safety strategies. SLP Short Term Goal 3 (Week 3): SLP will determine if any benefit from basic level non-verbal communmication (communication board, etc) with modA.  Skilled Therapeutic Interventions:Skilled ST services focused on swallow skills. SLP facilitated PO intake of solid upgrade trial tray, dys 2 textures. Pt mainly consumed pureed textures due to limited desire to consume meats and vegetables. Pt was agreeable to x3 bites of dys 2 textures, although bolus manipulation time increased pt was able to clear or cavity. Pt prefers to consume fruits (dys 3 textures ) and pureed items (mash potatoes, jello and sherbet.) SLP upgraded diet to dys 3 textures to provide least restrictions, increase quality of life and to possibly increase PO intake with selection of preferred foods provided. Pt required total A feeding. Pt was left in room with call bell within reach and bed alarm set. SLP recommends to continue skilled services.     Pain Pain Assessment Pain Scale: 0-10 Pain Score: 0-No pain  Therapy/Group: Individual Therapy  Lennette Fader  [facility] 02/01/2021, 12:38 PM

## 2021-02-01 NOTE — Final Progress Note (Signed)
Pt. Presented with tears as she refused lactulose due to multiple loose bowels. Bowel sounds hyperactive to auscultation X4 quad.

## 2021-02-02 LAB — BASIC METABOLIC PANEL
Anion gap: 5 (ref 5–15)
BUN: 10 mg/dL (ref 6–20)
CO2: 24 mmol/L (ref 22–32)
Calcium: 8.1 mg/dL — ABNORMAL LOW (ref 8.9–10.3)
Chloride: 109 mmol/L (ref 98–111)
Creatinine, Ser: 0.63 mg/dL (ref 0.44–1.00)
GFR, Estimated: >60 mL/min (ref >60)
Glucose, Bld: 52 mg/dL — ABNORMAL LOW (ref 70–99)
Potassium: 4.2 mmol/L (ref 3.5–5.1)
Sodium: 138 mmol/L (ref 135–145)

## 2021-02-02 LAB — GLUCOSE, CAPILLARY
Glucose-Capillary: 47 mg/dL — ABNORMAL LOW (ref 70–99)
Glucose-Capillary: 69 mg/dL — ABNORMAL LOW (ref 70–99)
Glucose-Capillary: 85 mg/dL (ref 70–99)
Glucose-Capillary: 131 mg/dL — ABNORMAL HIGH (ref 70–99)
Glucose-Capillary: 219 mg/dL — ABNORMAL HIGH (ref 70–99)
Glucose-Capillary: 216 mg/dL — ABNORMAL HIGH (ref 70–99)

## 2021-02-02 LAB — MAGNESIUM: Magnesium: 1.8 mg/dL (ref 1.7–2.4)

## 2021-02-02 MED ORDER — INSULIN ASPART 100 UNIT/ML IJ SOLN
12.0000 [IU] | Freq: Three times a day (TID) | INTRAMUSCULAR | Status: DC
  Administered 2021-02-02: 12 [IU] via SUBCUTANEOUS

## 2021-02-02 MED ORDER — INSULIN GLARGINE-YFGN 100 UNIT/ML ~~LOC~~ SOLN
15.0000 [IU] | Freq: Two times a day (BID) | SUBCUTANEOUS | Status: DC
  Administered 2021-02-02: 15 [IU] via SUBCUTANEOUS
  Filled 2021-02-02 (×3): qty 0.15

## 2021-02-02 MED ORDER — WITCH HAZEL-GLYCERIN EX PADS
MEDICATED_PAD | CUTANEOUS | Status: DC | PRN
  Filled 2021-02-02: qty 100

## 2021-02-02 MED ORDER — GERHARDT'S BUTT CREAM
TOPICAL_CREAM | Freq: Three times a day (TID) | CUTANEOUS | Status: DC
  Administered 2021-02-02: 1 via TOPICAL
  Filled 2021-02-02: qty 1

## 2021-02-02 NOTE — Patient Care Conference (Signed)
Inpatient RehabilitationTeam Conference and Plan of Care Update Date: 02/02/2021   Time: 11:09 AM    Patient Name: Meagan Peck      Medical Record Number: [id number]  Date of Birth: Sep 07, 1960 Sex: Female         Room/Bed: 4W21C/4W21C-01 Payor Info: Payor: /    Admit Date/Time:  01/12/2021 12:10 PM  Primary Diagnosis:  Demyelinating disease of central nervous system, unspecified [facility])  Hospital Problems: Principal Problem:   Demyelinating disease of central nervous system, unspecified ([place]) Active Problems:   Hepatic encephalopathy   Acute on chronic anemia   Uncontrolled type 2 diabetes mellitus with hyperglycemia ([place])   Spinal stenosis in cervical region   Transaminitis   Hypoalbuminemia due to protein-calorie malnutrition ([place])   Muscle tightness   Expressive aphasia   Ileus ([place])   Goals of care, counseling/discussion    Expected Discharge Date: Expected Discharge Date: 02/08/21  Team Members Present: Physician leading conference: Dr. Courtney Heys Social Worker Present: Erlene Quan, BSW Nurse Present: Dorthula Nettles, RN PT Present: Excell Seltzer, PT OT Present: Roanna Epley, [place], OT SLP Present: Charolett Bumpers, SLP PPS Coordinator present : Gunnar Fusi, SLP     Current Status/Progress Goal Weekly Team Focus  Bowel/Bladder   incontinent b/b, lbm 01/31/2021  Pt will become continent of bowel and bladder  timed toilet schedule   Swallow/Nutrition/ Hydration   soft textures/dys 3 and thin liquids  downgraded to max A  education and tolerance of diet   ADL's   dependent/tot A for bathing/dressing at bed level; bed mobility-max A+2  downgraded to max A  education, bed mobility, sitting balance, activity tolerance   Mobility   max to total A +2 rolling, supine to/from sit, SB transfer, max A sitting balance  downgraded to max A overall  therapy tolerance, sitting balance, OOB tolerance   Communication   max-mod A  downgraded Mod A on  9/27  speech intelligibility strategies and use of ACC.   Safety/Cognition/ Behavioral Observations            Pain   6/10 pain, k-pad added  < 3  assess q 4 hr and prn   Skin   incisions healing  no new breakdown  will assess q shift and prn     Discharge Planning:  Pt to d/c to home with support from her dtr Amber who has a flexible work schedule. Pt husband only able to provide supervision due to his own medical conditions.   Team Discussion: Gerhardt's and tucks added for rawness to bottom. Ammonia level down. Not eating well, afraid to have bowel movements. Adjusting diabetic medications and other medications. Incontinent B/B, LBM 02/01/2021. Refusing Lactulose. Reports 6/10 pain, K-pad added. ? If rectal tube is needed. Jaundice improving. Patient was a little better yesterday. Better speech. Not a huge functional change. Makes needs known well. Family will need Media planner. Upgraded diet to Dys 3, soft, thin liquids. Will go home via ambulance. Patient on target to meet rehab goals: no, health is declining  *See Care Plan and progress notes for long and short-term goals.   Revisions to Treatment Plan:  MD adjusting medications, code status changed to DNR.  Teaching Needs: Family education, medication management, pain management, skin/wound care, diabetes management, hoyer lift training, bowel/bladder management, transfer training, balance training, endurance training, safety awareness.  Current Barriers to Discharge: Decreased caregiver support, Medical stability, Home enviroment access/layout, Incontinence, Wound care, Lack of/limited family support, Insurance for SNF coverage, Medication  compliance, and Nutritional means  Possible Resolutions to Barriers: Medication adjustments addressed. Diabetes management addressed. Will train family on [place] lift. Skin care to bottom addressed. Bowel/bladder management addressed. Providing emotional support.     Medical  Summary Current Status: Cirhoosis- ammonia down to 30s- incontinent of B/B; refusing lactulose- needs to take some due to ammonia; jaundice improving.  Barriers to Discharge: Behavior;Decreased family/caregiver support;Home enviroment access/layout;Incontinence;Medical stability;Wound care  Barriers to Discharge Comments: DM; lack of insurance; mnay BM's.day; home with hospice Possible Resolutions to [employer] Focus: added gerhardt's cream and tucks- strength not becoming more functionally- still max A for everything; poor core strength; less dysarthria- goals- home with hospice; needs hoyer transfer training; SLP- good changes but not functionally- D3 thin diet- upgraded- family training on TH/Friday; 10/10- will need transport home-   Continued Need for Acute Rehabilitation Level of Care: The patient requires daily medical management by a physician with specialized training in physical medicine and rehabilitation for the following reasons: Direction of a multidisciplinary physical rehabilitation program to maximize functional independence : Yes Medical management of patient stability for increased activity during participation in an intensive rehabilitation regime.: Yes Analysis of laboratory values and/or radiology reports with any subsequent need for medication adjustment and/or medical intervention. : Yes   I attest that I was present, lead the team conference, and concur with the assessment and plan of the team.   Cristi Loron 02/02/2021, 5:39 PM

## 2021-02-02 NOTE — Progress Notes (Signed)
Patient ID: Meagan Peck, female   DOB: 18-May-1960, 60 y.o.   MRN: [id number] Team Conference Report to Patient/Family  Team Conference discussion was reviewed with the patient and caregiver, including goals, any changes in plan of care and target discharge date.  Patient and caregiver express understanding and are in agreement.  The patient has a target discharge date of 02/08/21.  Daughter having wisdom tooth removed on Friday. Unable to complete education on Thursday and Friday. Daughter available on Wednesday and Thursday for family education. Primary Sw will follow up with daughter on Thursday for discharge arrangements.   Dyanne Iha 02/02/2021, 11:35 AM

## 2021-02-02 NOTE — Progress Notes (Signed)
Physical Therapy Session Note  Patient Details  Name: Meagan Peck MRN: [id number] Date of Birth: [birth year]  Today's Date: 02/02/2021 PT Individual Time: 1500-1525 PT Individual Time Calculation (min): 25 min   Short Term Goals: Week 3:  PT Short Term Goal 1 (Week 3): Pt will perform supine to/from sit with assist x 1 consistently PT Short Term Goal 2 (Week 3): Pt will perform least restrictive transfer with assist x 1 consistently PT Short Term Goal 3 (Week 3): Pt will tolerate sitting OOB in chair x 1 hour  Skilled Therapeutic Interventions/Progress Updates:    Pt received seated in bed, agreeable to PT session. Pt reports some soreness in her shoulders, not rated, utilizing kpad for pain management. [name] present during session, reviewed equipment that pt will be d/c home with including hospital bed and manual hoyer lift and that use of equipment will be reviewed during family educations sessions on Wed and Thu of this week. Also discussed that pt will d/c home via ambulance. Pt agreeable to sit up to EOB. Pt is max A x 2 for supine to sit with HOB elevated. Placed 4" step under BLE for improved support in sitting. Attempted unsupported sitting EOB, pt able to maintain sitting balance for about 10 sec before onset of posterior and or L lateral lean and requires max A to maintain sitting balance. Pt fatigues quickly in sitting. Pt returned to supine with assist x 2 for trunk control and LE management. Pt left seated in bed with soft touch call button in reach, bed alarm in place, daughter present.  Therapy Documentation Precautions:  Precautions Precautions: Fall Precaution Comments: R sided weakness, fall risk Restrictions Weight Bearing Restrictions: No     Therapy/Group: Individual Therapy   Excell Seltzer, PT, DPT, CSRS  02/02/2021, 4:25 PM

## 2021-02-02 NOTE — Progress Notes (Signed)
Occupational Therapy Session Note  Patient Details  Name: Meagan Peck MRN: [id number] Date of Birth: 01-May-1961  Today's Date: 02/02/2021 OT Individual Time: 1300-1330 OT Individual Time Calculation (min): 30 min    Short Term Goals: Week 3:  OT Short Term Goal 1 (Week 3): Pt will roll R/L in bed with max A to facilitate dressing/bathing tasks at bed level OT Short Term Goal 2 (Week 3): Pt will perform self feeding with max A OT Short Term Goal 3 (Week 3): Pt will maintan sitting balance with max A+1 in preparation for BADLs and transfers  Skilled Therapeutic Interventions/Progress Updates:    Pt partially lying on her left side upon arrival. Pt crying although she initially denied pain. Pt emotionally distraught in regards to her prognosis and her present medical condition. Wet wash cloth provided but dependent for washing face. Pt agreeable to changing her nightgown. Pt incontinent of bladder (brief completely drenched with urine along with pad and her current nightgown.) Pt dependent for hygiene, changing brief, and changing nightgown. Pt dependent for bathing at bed level. Pt max A+2 for rolling in bed. Pt able to flex Bil knees to assist with rolling and intiate rolling to Rt. Pt requested to eat some of her Jello that her daughter brought in. Pt dependent for self feeding. Pt reamined in bed. Soft call bell positioned so pt can activate. Pt activated call bell X1. Bed alarm activated. Charge nurse notified of condition of brief and pt's current status.  Therapy Documentation Precautions:  Precautions Precautions: Fall Precaution Comments: R sided weakness, fall risk Restrictions Weight Bearing Restrictions: No  Pain:  Pt c/o 7/10 Rt shoulder/UE pain with activity; repositioned and k-pad  Therapy/Group: Individual Therapy  Leroy Libman 02/02/2021, 1:45 PM

## 2021-02-02 NOTE — Progress Notes (Signed)
Speech Language Pathology Daily Session Note  Patient Details  Name: Meagan Peck MRN: [id number] Date of Birth: 1960-07-02  Today's Date: 02/02/2021 SLP Individual Time: 1430-1503 SLP Individual Time Calculation (min): 33 min  Short Term Goals: Week 3: SLP Short Term Goal 1 (Week 3): Patient will verbalize basic wants/needs and responses to questions at word level with maxA verbal cues for improving speech intelligibility. SLP Short Term Goal 2 (Week 3): Patient will tolerate trials of soft solids with SLP with minA cues for safety strategies. SLP Short Term Goal 3 (Week 3): SLP will determine if any benefit from basic level non-verbal communmication (communication board, etc) with modA.  Skilled Therapeutic Interventions:Skilled ST services focused on swallow and speech skills. Pt demonstrated increase speech intelligibility at word level upon entering room requesting lotion on back. SLP and NT provided total A in bed ambulation to apply lotion. SLP facilitated PO consumption of dys 2 texture snack due to preference, pt demonstrated sightly prolonged mastication time however overall WFL. SLP also facilitated use of speech intelligibility skills, producing 1 one per breath with increased difficulty producing initial /p/ words, requiring 1 breath for initial /p/ phoneme then another breath for the remainder of the word. SLP created prefer foods list with pt and pt's daughter, pt was able to list items at word level with communication breakdown in 40% of the opportunities, with ability to correct with mod A verbal cues increasing to 60% intelligible. SLP also educated daughter how to place meal orders via phone. All questions answered to satisfaction. Pt was left in room with daughter, call bell within reach and bed alarm set. SLP recommends to continue skilled services.     Pain Pain Assessment Pain Scale: 0-10 Pain Score: 0-No pain  Therapy/Group: Individual Therapy  Henya Aguallo   [facility] 02/02/2021, 12:02 PM

## 2021-02-02 NOTE — Progress Notes (Addendum)
[place] PHYSICAL MEDICINE & REHABILITATION PROGRESS NOTE  Subjective/Complaints:   Pt reports butt still sore, but less so- will order Gerhardt's butt cream for raw tissues from multiple BM's. Also order Tucks prn.   K+ 4.2 Strength feels about same to pt.   ROS: limited by dysarthria  Objective: Vital Signs: Blood pressure (!) 102/39, pulse 73, temperature 98 F (36.7 C), temperature source Oral, resp. rate 14, height 4' 10"  (1.473 m), weight 59.7 kg, last menstrual period 12/03/2013, SpO2 100 %. No results found. Recent Labs    02/01/21 0514  WBC 4.9  HGB 8.9*  HCT 24.2*  PLT 115*     Recent Labs    02/01/21 0514 02/02/21 0420  NA 139 138  K 3.1* 4.2  CL 111 109  CO2 22 24  GLUCOSE 59* 52*  BUN 10 10  CREATININE 0.62 0.63  CALCIUM 8.1* 8.1*      Intake/Output Summary (Last 24 hours) at 02/02/2021 0952 Last data filed at 02/02/2021 0758 Gross per 24 hour  Intake 280 ml  Output --  Net 280 ml        Physical Exam: BP (!) 102/39 (BP Location: Left Arm)   Pulse 73   Temp 98 F (36.7 C) (Oral)   Resp 14   Ht 4' 10"  (1.473 m)   Wt 59.7 kg   LMP 12/03/2013 (LMP Unknown)   SpO2 100%   BMI 27.51 kg/m         General: awake, alert, appropriate, laying supine in bed; brighter affect; NAD HENT: dysconjugate gaze; oropharynx moist CV: regular rate; no JVD Pulmonary: CTA B/L; no W/R/R- good air movement GI: soft, NT, ND, (+)BS Psychiatric: appropriate; brighter affect- not tearful today Neurological: alert; dysarthric- no change in dysarthria however RUE- 2+/5 and RLE 3/5 in DF and PF- which is MUCH better   Ext: no clubbing, cyanosis, or edema Psych: flat but cooperative, delayed  Skin: No evidence of breakdown, no evidence of rash  Neuro: Alert, dysconjugate gaze. Follows basic commands. Dysarthric, delayed. Motor: RUE: 0/5 proximal distal,  RLE: 0/5 proximal distal- no change today on R side ~ 3+/5 on L side Sensation diminished to light  touch right hand, stable MSK- pain right shoulder with IR. Heel cords tight   Assessment/Plan: 1. Functional deficits which require 3+ hours per day of interdisciplinary therapy in a comprehensive inpatient rehab setting. Physiatrist is providing close team supervision and 24 hour management of active medical problems listed below. Physiatrist and rehab team continue to assess barriers to discharge/monitor patient progress toward functional and medical goals   Care Tool:  Bathing    Body parts bathed by patient: Chest, Abdomen   Body parts bathed by helper: Right arm, Left arm, Front perineal area, Buttocks, Right upper leg, Left upper leg, Right lower leg, Left lower leg, Face     Bathing assist Assist Level: Total Assistance - Patient < 25%     Upper Body Dressing/Undressing Upper body dressing   What is the patient wearing?: Pull over shirt    Upper body assist Assist Level: Total Assistance - Patient < 25%    Lower Body Dressing/Undressing Lower body dressing      What is the patient wearing?: Pants     Lower body assist Assist for lower body dressing: Total Assistance - Patient < 25%     Toileting Toileting    Toileting assist Assist for toileting: Maximal Assistance - Patient 25 - 49%     Transfers Chair/bed  transfer  Transfers assist     Chair/bed transfer assist level: 2 Helpers     Locomotion Ambulation   Ambulation assist   Ambulation activity did not occur: Safety/medical concerns (pt became emotional, did not have time for further mobility)  Assist level: Minimal Assistance - Patient > 75% Assistive device: Walker-rolling Max distance: 50'   Walk 10 feet activity   Assist  Walk 10 feet activity did not occur: Safety/medical concerns  Assist level: Minimal Assistance - Patient > 75% Assistive device: Walker-rolling   Walk 50 feet activity   Assist Walk 50 feet with 2 turns activity did not occur: Safety/medical concerns  Assist  level: Minimal Assistance - Patient > 75% Assistive device: Walker-rolling    Walk 150 feet activity   Assist Walk 150 feet activity did not occur: Safety/medical concerns         Walk 10 feet on uneven surface  activity   Assist Walk 10 feet on uneven surfaces activity did not occur: Safety/medical concerns         Wheelchair     Assist Is the patient using a wheelchair?: Yes Type of Wheelchair: Manual Wheelchair activity did not occur: Safety/medical concerns  Wheelchair assist level: Dependent - Patient 0%      Wheelchair 50 feet with 2 turns activity    Assist    Wheelchair 50 feet with 2 turns activity did not occur: Safety/medical concerns       Wheelchair 150 feet activity     Assist  Wheelchair 150 feet activity did not occur: Safety/medical concerns        Medical Problem List and Plan: 1.  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  Appreciate neurology follow up. No new neuro intereventions recommended 10/4- will check with team if strength translates to functional changes- con't PT and OT- currently 1x/day.  2.  Antithrombotics: -DVT/anticoagulation: SCDs 9/28- Plts 127- con't  to monitor  Pharmaceutical: Other (comment)             -antiplatelet therapy: N/A 3. Pain Management: Tylenol d/ced given hepatic failure neurogenic pain over chest and abd bilateral , has some signal abnormality in thalamus bilaterally   Baclofen 5 BID started on 9/20, changed to nightly on 9/21 due to lethargy, d/ced on 9/23 d/t lethargy 4. Mood: Provide emotional support             -antipsychotic agents: N/A 5. Neuropsych: This patient is not  Fully capable of making  decisions on her own behalf. Some emotional lability as expected given extensive brain stem lesion   9/27- will call palliative care to discuss pt's care. 9/28- consulted palliative care- spoke to them- wants ot determine goals of care.  9/29- they attempted to see, but couldn't speak with pt and couldn't reach husband. Willc on't to try  9/30- pallative care saw pt- made DNR- she wants ot go home- had pt see Neuropsych today as well.   10/4- will send home on Hospice.  6. Skin/Wound Care: Routine skin checks 7. Fluids/Electrolytes/Nutrition:   I personally reviewed the patient's labs today.   8.  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  Neurology follow-up conservative care  9/29- spoke with Neuro- they think it's made worse by high ammonia- Ammonia 113 today- will try to get  her to go/have multiple BM's- complicated by ileus.   9/30- Ammonia 118- working on increasing lactulose. 10/2- Ammonia down to 38- MUCH better- and she looks better/less dysarthric; no change in strength that I can see, though- 10/3- strength much better in RUE/RLE this AM- will con't lactulose and Rafaxamin.   9.  Uncontrolled diabetes mellitus with hyperglycemia.  Hemoglobin A1c 10.7.   NovoLog 12 units 3 times daily, increase to 14 units on 9/16 Semglee 20 units twice daily.  Diabetic teaching- couldn't afford insulin at home.  CBG (last 3)  Recent Labs    02/02/21 0610 02/02/21 0641 02/02/21 0649  GLUCAP 47* 69* 85  9/26 increase semglee to 22u bid  9/30- BG's controlled- con't regimen  10/2- Bgs 50s-60s this AM- will decrease Semglee to 20 units BID  10/3- will reduce Semglee to 18 units BID.   10/4- not eating as much- decrease mglee to 15 units BID and Novolog to 12 units with meals.  10.  Acute on chronic anemia.  Continue iron supplement.    Hemoglobin 11.1 on 9/19--> 10.6 9/26  F/u Monday 11. Constipation in setting of cirrhosis: Cont chronulac 75m 5x daily  9/27- LBM yesterday small x1-  will give sorbitol x1 today and con't lactulose- also on Rifaxamin.  9/30- ileus resolving- will increase Lactulose 12. Hiccups-?  Resolved 13. Thrombocytopenia  Platelets 106 on 9/19--> 127k 9/26  Continue to monitor 14.  Cervical stenosis  MRI showing multiple abnormalities  Appreciate neurosurgery recs, no plans for intervention at present 15.  NASH Cirrhosis    mild elevation of AST--> sl trend up 9/26 however--f/u later this week  10/2- labs tomorrow  10/3- Labs up more today 154 and 99- will recheck Thursday and if going up, will call GI.  16.  Hypoalbuminemia  Supplement initiated  17.  Hyperammonemia  Ammonia 121 on 9/22---> up to 145 9/26  -already receiving lactulose 355m5 x daily  -will ask GI for further recs to treat  9/28- will check KUB as to why cannot get her to poop- and get cleaned out.   9/30- Ammonia 118- will increase lactulose  10/2- Ammonia down to 38- no change in strength-   10/3- strength better today!  10/4- strength stable- will recheck Ammonia Thursday-  18.  Bilateral ankle heel cord tightness add PRAFO   9/27- con't PRAFOs- pt appears to be getting weaker? Will d/w therapy.  19. Ileus  9/29- will change to clear liquid diet and IVFs 75cc/hour x 1 days and give Sorbitol and enema to try and get her moving- making high ammonia worse  10/2- will stop IVFs and change back to D1 thins diet.  20. Hypomagnesemia  9/29- will give MgOx 800 mg BID and recheck in AM- might need IV Mg.   9/30- will give IV Mg and recheck levels Sunday. Can give IV 4G per d/w pharmacy  10/2- will recechk in AM- Mg 1.5  10/3- Mg 1.4- will repeat IV MG. Labs in AM  10/4- Mg up to 1.8!- con't PO meds 11. Hypokalemia  10/3- will replete KCL 40 mEq x2 and recheck in AM  10/4- K+ up to 4.2- con't to monitor 12. Raw buttocks  10/4- added gerhardt's and tucks.   LOS: 21 days A FACE TO FACE EVALUATION WAS PERFORMED  Tanna Loeffler 02/02/2021, 9:52 AM

## 2021-02-03 LAB — GLUCOSE, CAPILLARY
Glucose-Capillary: 61 mg/dL — ABNORMAL LOW (ref 70–99)
Glucose-Capillary: 73 mg/dL (ref 70–99)
Glucose-Capillary: 178 mg/dL — ABNORMAL HIGH (ref 70–99)
Glucose-Capillary: 170 mg/dL — ABNORMAL HIGH (ref 70–99)
Glucose-Capillary: 135 mg/dL — ABNORMAL HIGH (ref 70–99)

## 2021-02-03 MED ORDER — ACETAMINOPHEN 325 MG PO TABS: 325.0000 mg | ORAL_TABLET | Freq: Four times a day (QID) | ORAL | Status: DC | PRN

## 2021-02-03 MED ORDER — INSULIN GLARGINE-YFGN 100 UNIT/ML ~~LOC~~ SOLN
8.0000 [IU] | Freq: Two times a day (BID) | SUBCUTANEOUS | Status: DC
  Administered 2021-02-04 – 2021-02-05 (×3): 8 [IU] via SUBCUTANEOUS
  Filled 2021-02-03 (×5): qty 0.08

## 2021-02-03 MED ORDER — CITALOPRAM HYDROBROMIDE 20 MG PO TABS
20.0000 mg | ORAL_TABLET | Freq: Every day | ORAL | Status: DC
  Administered 2021-02-03 – 2021-02-08 (×6): 20 mg via ORAL
  Filled 2021-02-03 (×6): qty 1

## 2021-02-03 MED ORDER — INSULIN ASPART 100 UNIT/ML IJ SOLN
6.0000 [IU] | Freq: Three times a day (TID) | INTRAMUSCULAR | Status: DC
  Administered 2021-02-03 – 2021-02-06 (×10): 6 [IU] via SUBCUTANEOUS

## 2021-02-03 MED ORDER — DRONABINOL 2.5 MG PO CAPS
2.5000 mg | ORAL_CAPSULE | Freq: Two times a day (BID) | ORAL | Status: DC
  Administered 2021-02-03 – 2021-02-08 (×11): 2.5 mg via ORAL
  Filled 2021-02-03 (×11): qty 1

## 2021-02-03 NOTE — Progress Notes (Signed)
Occupational Therapy Session Note  Patient Details  Name: Meagan Peck MRN: [id number] Date of Birth: December 18, 1960  Today's Date: 02/03/2021 OT Individual Time: 7371-0626 OT Individual Time Calculation (min): 45 min    Short Term Goals: Week 3:  OT Short Term Goal 1 (Week 3): Pt will roll R/L in bed with max A to facilitate dressing/bathing tasks at bed level OT Short Term Goal 2 (Week 3): Pt will perform self feeding with max A OT Short Term Goal 3 (Week 3): Pt will maintan sitting balance with max A+1 in preparation for BADLs and transfers  Skilled Therapeutic Interventions/Progress Updates:    Pt resting in bed upon arrival with daughter at bedside. Intial focus on rolling in bed to change brief. Pt incontinent of bladder. Rolling R/L in bed with min A-pt able to flex Bil knees in preparatin for rolling. Pt dependent for changing brief and hygiene. Introduced use of hoyer and sling to daughter. Described placement of sling but unable to practice. The only sling available is Rachelle Hora and pt is 4'11". Will attempt to acquire smaller sling. Pt repositioned in bed for comfort. Daughter at bedside. Bed alarm activated.  Therapy Documentation Precautions:  Precautions Precautions: Fall Precaution Comments: R sided weakness, fall risk Restrictions Weight Bearing Restrictions: No  Pain: Pt c/o RUE (triceps) discomfort with PROM; repositioned with relief noted  Therapy/Group: Individual Therapy  Leroy Libman 02/03/2021, 11:16 AM

## 2021-02-03 NOTE — Progress Notes (Signed)
Speech Language Pathology Daily Session Note  Patient Details  Name: RENESSA WELLNITZ MRN: [id number] Date of Birth: 28-Jun-1960  Today's Date: 02/03/2021 SLP Individual Time: 3953-2023 SLP Individual Time Calculation (min): 43 min  Short Term Goals: Week 3: SLP Short Term Goal 1 (Week 3): Patient will verbalize basic wants/needs and responses to questions at word level with maxA verbal cues for improving speech intelligibility. SLP Short Term Goal 2 (Week 3): Patient will tolerate trials of soft solids with SLP with minA cues for safety strategies. SLP Short Term Goal 3 (Week 3): SLP will determine if any benefit from basic level non-verbal communmication (communication board, etc) with modA.  Skilled Therapeutic Interventions:Skilled ST services focused on speech skills. Pt expressed kitchen bring up wrong breakfast order. SLP placed next three meal orders via phone. Pt demonstrated 60% intelligibility at word level and required the use of alphabet board to spell out word with initial /t/ speech sound. SLP provided copy of alphabet board, dys 2 and dys 3 texture handouts for pt's daughter. Pt daughter was not present for education today, however education was initiated last session. Pt was left in room with call bell within reach and bed alarm set. SLP recommends to continue skilled services.     Pain Pain Assessment Pain Score: 0-No pain  Therapy/Group: Individual Therapy  Honestie Kulik  [facility] 02/03/2021, 3:53 PM

## 2021-02-03 NOTE — Progress Notes (Signed)
Nutrition Follow-up  DOCUMENTATION CODES:   Not applicable  INTERVENTION:  Continue Ensure Max po BID, each supplement provides 150 kcal and 30 grams of protein.    Continue 30 ml Prosource plus po BID, each supplement provides 100 kcal and 15 grams of protein.    Encourage adequate PO intake.  NUTRITION DIAGNOSIS:   Increased nutrient needs related to chronic illness (NASH cirrhosis) as evidenced by estimated needs; ongoing  GOAL:   Patient will meet greater than or equal to 90% of their needs; progressing  MONITOR:   PO intake, Supplement acceptance, Skin, Weight trends, Labs, I & O's  REASON FOR ASSESSMENT:   Malnutrition Screening Tool    ASSESSMENT:   60 year old right-handed female with history significant of Meagan Peck cirrhosis with previous hepatic encephalopathy maintained on chronic Cipro, diabetes mellitus hypertension hyperlipidemia, esophageal varices/GERD. Presented 01/08/2021 altered mental status and slurred speech. MRI showed striking abnormal diffusion, T2 and flair signal abnormality symmetrically affecting the pons with sparing of the bilateral cortical infarcts favoring osmotic demyelination syndrome. 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  Pt currently on a carbohydrate modified diet with thin liquids. Meal completion 50-100%. MD has ordered marinol to aid in appetite. Pt DNR with plans for hom on hospice. Pt currently has Ensure Max and Prosource plus ordered with varied consumption. RD to continue with current orders to aid in caloric and protein needs. Labs and medications reviewed.   Diet Order:   Diet Order             Diet Carb Modified Fluid consistency: Thin; Room service appropriate? Yes  Diet effective now                   EDUCATION NEEDS:   Not appropriate for education at this time  Skin:  Skin Assessment: Reviewed RN  Assessment  Last BM:  10/4  Height:   Ht Readings from Last 1 Encounters:  01/12/21 4' 10"  (1.473 m)    Weight:   Wt Readings from Last 1 Encounters:  01/26/21 59.7 kg    BMI:  Body mass index is 27.51 kg/m.  Estimated Nutritional Needs:   Kcal:  1700-1900  Protein:  80-95 grams  Fluid:  >/= 1.7 L/day  [name], MS, RD, LDN RD pager number/after hours weekend pager number on Amion.

## 2021-02-03 NOTE — Plan of Care (Signed)
  Problem: RH Bed to Chair Transfers Goal: LTG Patient will perform bed/chair transfers w/assist (PT) Description: LTG: Patient will perform bed to chair transfers with assistance (PT). Flowsheets (Taken 02/03/2021 1714) LTG: Pt will perform Bed to Chair Transfers with assistance level: (downgrade due to decline in function) Dependent - mechanical lift Note: Downgrade due to decline in function   Problem: RH Wheelchair Mobility Goal: LTG Patient will propel w/c in controlled environment (PT) Description: LTG: Patient will propel wheelchair in controlled environment, # of feet with assist (PT) Flowsheets (Taken 02/03/2021 1714) LTG: Pt will propel w/c in controlled environ  assist needed:: (d/c goal due to decline in function) -- Note: D/c goal due to decline in function

## 2021-02-03 NOTE — Progress Notes (Signed)
Physical Therapy Weekly Progress Note  Patient Details  Name: Meagan Peck MRN: [id number] Date of Birth: [birth year]  Beginning of progress report period: January 27, 2021 End of progress report period: February 03, 2021  Today's Date: 02/03/2021 PT Individual Time: 1400-1430 PT Individual Time Calculation (min): 30 min   Patient has met 0 of 3 short term goals.  Pt continues to exhibit an overall decline in function and plan is to d/c home with hospice. Pt currently requires max A for rolling R/L, +2 for supine to/from sit, and +2 for slide board or dependent lift transfers to a wheelchair. Pt tolerates sitting up in a chair for less than 30 minutes due to pain and fatigue. [name] is scheduled to be present for family education this week leading up to d/c home with hospice early next week.  Patient continues to demonstrate the following deficits muscle weakness, decreased cardiorespiratoy endurance, abnormal tone, unbalanced muscle activation, and decreased coordination, and decreased sitting balance, decreased postural control, hemiplegia, and decreased balance strategies and therefore will continue to benefit from skilled PT intervention to increase functional independence with mobility.  Patient not progressing toward long term goals.  See goal revision..  Plan of care revisions: downgraded transfer goal to dependent with mechanical lift as family will utilize hoyer lift at home and d/c w/c mobility goal as pt is no longer appropriate to pursue this goal at this time due to functional decline.  PT Short Term Goals Week 3:  PT Short Term Goal 1 (Week 3): Pt will perform supine to/from sit with assist x 1 consistently PT Short Term Goal 1 - Progress (Week 3): Progressing toward goal PT Short Term Goal 2 (Week 3): Pt will perform least restrictive transfer with assist x 1 consistently PT Short Term Goal 2 - Progress (Week 3): Not progressing PT Short Term Goal 3 (Week 3): Pt will  tolerate sitting OOB in chair x 1 hour PT Short Term Goal 3 - Progress (Week 3): Progressing toward goal Week 4:  PT Short Term Goal 1 (Week 4): =LTG due to ELOS  Skilled Therapeutic Interventions/Progress Updates:    Pt received seated in bed, agreeable to PT session. Pt reports pain in her R arm, utilized kpad at end of session for pain management. [name] not present for scheduled family education session, per pt report daughter had to leave for work but is on her way to the hospital. Performed rolling R/L with max A, pt able to flex both LE to assist with transfer but unable to utilize RUE to reach for bedrail and unable to maintain grasp on bedrail with LUE. [name] arrives at end of session but unable to observe review of transfer with manual hoyer or perform hands-on practice due to time constraints. Provided handout for manual hoyer instructions and will provide hands-on review tomorrow during scheduled family education session. Pt left seated in bed with soft touch call button in reach, bed alarm in place, daughter present.  Therapy Documentation Precautions:  Precautions Precautions: Fall Precaution Comments: R sided weakness, fall risk Restrictions Weight Bearing Restrictions: No    Therapy/Group: Individual Therapy   Excell Seltzer, PT, DPT, CSRS  02/03/2021, 5:12 PM

## 2021-02-03 NOTE — Progress Notes (Signed)
[place] PHYSICAL MEDICINE & REHABILITATION PROGRESS NOTE  Subjective/Complaints:    Pt reports doesn't want to eat anything that was brought-   Food "nasty"- and then started crying hysterically- cannot understand what she's saying- NT's were so helpful trying to get her to eat- pt refuses ALL supplements.   Denies pain or distaste in mouth-   ROS: limited by cognition and dysarthria Objective: Vital Signs: Blood pressure (!) 124/51, pulse 75, temperature 98.9 F (37.2 C), temperature source Oral, resp. rate 17, height 4' 10"  (1.473 m), weight 59.7 kg, last menstrual period 12/03/2013, SpO2 99 %. No results found. Recent Labs    02/01/21 0514  WBC 4.9  HGB 8.9*  HCT 24.2*  PLT 115*     Recent Labs    02/01/21 0514 02/02/21 0420  NA 139 138  K 3.1* 4.2  CL 111 109  CO2 22 24  GLUCOSE 59* 52*  BUN 10 10  CREATININE 0.62 0.63  CALCIUM 8.1* 8.1*      Intake/Output Summary (Last 24 hours) at 02/03/2021 1457 Last data filed at 02/03/2021 1258 Gross per 24 hour  Intake 330 ml  Output --  Net 330 ml        Physical Exam: BP (!) 124/51 (BP Location: Right Arm)   Pulse 75   Temp 98.9 F (37.2 C) (Oral)   Resp 17   Ht 4' 10"  (1.473 m)   Wt 59.7 kg   LMP 12/03/2013 (LMP Unknown)   SpO2 99%   BMI 27.51 kg/m          General: awake, alert, appropriate, crying hysterically- NT's in room;- wn't eat anything- or drink; NAD HENT: conjugate gaze; oropharynx moist; no thrush or signs of oral erythema.  CV: regular rate; no JVD Pulmonary: CTA B/L; no W/R/R- good air movement GI: soft, NT, ND, (+)BS- hyperactive- having loose stools while I was in room;  Psychiatric: crying hysterically-  Neurological: alert; dysarthric- no change in dysarthria however RUE- 2+/5 and RLE 3/5 in DF and PF- which is [facility] better Not real functional so far.  Ext: no clubbing, cyanosis, or edema Psych: flat but cooperative, delayed  Skin: No evidence of breakdown, no evidence  of rash  Neuro: Alert, dysconjugate gaze. Follows basic commands. Dysarthric, delayed. Motor: RUE: 0/5 proximal distal,  RLE: 0/5 proximal distal- no change today on R side ~ 3+/5 on L side Sensation diminished to light touch right hand, stable MSK- pain right shoulder with IR. Heel cords tight   Assessment/Plan: 1. Functional deficits which require 3+ hours per day of interdisciplinary therapy in a comprehensive inpatient rehab setting. Physiatrist is providing close team supervision and 24 hour management of active medical problems listed below. Physiatrist and rehab team continue to assess barriers to discharge/monitor patient progress toward functional and medical goals   Care Tool:  Bathing    Body parts bathed by patient: Chest, Abdomen   Body parts bathed by helper: Right arm, Left arm, Front perineal area, Buttocks, Right upper leg, Left upper leg, Right lower leg, Left lower leg, Face     Bathing assist Assist Level: Total Assistance - Patient < 25%     Upper Body Dressing/Undressing Upper body dressing   What is the patient wearing?: Pull over shirt    Upper body assist Assist Level: Total Assistance - Patient < 25%    Lower Body Dressing/Undressing Lower body dressing      What is the patient wearing?: Pants     Lower body  assist Assist for lower body dressing: Total Assistance - Patient < 25%     Toileting Toileting    Toileting assist Assist for toileting: Maximal Assistance - Patient 25 - 49%     Transfers Chair/bed transfer  Transfers assist     Chair/bed transfer assist level: 2 Helpers     Locomotion Ambulation   Ambulation assist   Ambulation activity did not occur: Safety/medical concerns (pt became emotional, did not have time for further mobility)  Assist level: Minimal Assistance - Patient > 75% Assistive device: Walker-rolling Max distance: 50'   Walk 10 feet activity   Assist  Walk 10 feet activity did not occur:  Safety/medical concerns  Assist level: Minimal Assistance - Patient > 75% Assistive device: Walker-rolling   Walk 50 feet activity   Assist Walk 50 feet with 2 turns activity did not occur: Safety/medical concerns  Assist level: Minimal Assistance - Patient > 75% Assistive device: Walker-rolling    Walk 150 feet activity   Assist Walk 150 feet activity did not occur: Safety/medical concerns         Walk 10 feet on uneven surface  activity   Assist Walk 10 feet on uneven surfaces activity did not occur: Safety/medical concerns         Wheelchair     Assist Is the patient using a wheelchair?: Yes Type of Wheelchair: Manual Wheelchair activity did not occur: Safety/medical concerns  Wheelchair assist level: Dependent - Patient 0%      Wheelchair 50 feet with 2 turns activity    Assist    Wheelchair 50 feet with 2 turns activity did not occur: Safety/medical concerns       Wheelchair 150 feet activity     Assist  Wheelchair 150 feet activity did not occur: Safety/medical concerns        Medical Problem List and Plan: 1.  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  Appreciate neurology follow up. No new neuro intereventions recommended 10/4- will check with team if strength translates to functional changes- con't PT and OT- currently 1x/day.  10/5- changed d/c date to 10/10- will need ambulance to get home- con't PT and OT- CIR- doesn't have enough strength to translate into anything functional.  2.  Antithrombotics: -DVT/anticoagulation: SCDs 9/28- Plts 127- con't  to monitor  Pharmaceutical: Other (comment)             -antiplatelet therapy: N/A 3. Pain  Management: Tylenol d/ced given hepatic failure neurogenic pain over chest and abd bilateral , has some signal abnormality in thalamus bilaterally   Baclofen 5 BID started on 9/20, changed to nightly on 9/21 due to lethargy, d/ced on 9/23 d/t lethargy 4. Mood: Provide emotional support             -antipsychotic agents: N/A 5. Neuropsych: This patient is not  Fully capable of making decisions on her own behalf. Some emotional lability as expected given extensive brain stem lesion   9/27- will call palliative care to discuss pt's care. 9/28- consulted palliative care- spoke to them- wants ot determine goals of care.  9/29- they attempted to see, but couldn't speak with pt and couldn't reach husband. Willc on't to try  9/30- pallative care saw pt- made DNR- she wants ot go home- had pt  see Neuropsych today as well.   10/4- will send home on Hospice.   10/5- not eating- concerned and let pt know this will cause problems with life expectancy. Will start Celexa 20 mg daily for depression 6. Skin/Wound Care: Routine skin checks 7. Fluids/Electrolytes/Nutrition:   I personally reviewed the patient's labs today.   8.  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  Neurology follow-up conservative care  9/29- spoke with Neuro- they think it's made worse by high ammonia- Ammonia 113 today- will try to get her to go/have multiple BM's- complicated by ileus.   9/30- Ammonia 118- working on increasing lactulose. 10/2- Ammonia down to 38- MUCH better- and she looks better/less dysarthric; no change in strength that I can see, though- 10/3- strength much better in RUE/RLE this AM- will con't lactulose and Rafaxamin.   9.  Uncontrolled diabetes mellitus with hyperglycemia.  Hemoglobin A1c 10.7.   NovoLog 12 units 3 times daily, increase to 14 units on 9/16 Semglee 20 units twice daily.  Diabetic teaching- couldn't afford insulin at home.  CBG (last 3)  Recent Labs    02/03/21 0641 02/03/21 0701  02/03/21 1125  GLUCAP 61* 73 178*  9/26 increase semglee to 22u bid  9/30- BG's controlled- con't regimen  10/2- Bgs 50s-60s this AM- will decrease Semglee to 20 units BID  10/3- will reduce Semglee to 18 units BID.   10/4- not eating as much- decrease mglee to 15 units BID and Novolog to 12 units with meals.   10/5- reducing Novolog to 6 units and Semglee to 8 units BID since NOT eating basically at all.  10.  Acute on chronic anemia.  Continue iron supplement.    Hemoglobin 11.1 on 9/19--> 10.6 9/26  F/u Monday 11. Constipation in setting of cirrhosis: Cont chronulac 38m 5x daily  9/27- LBM yesterday small x1- will give sorbitol x1 today and con't lactulose- also on Rifaxamin.  9/30- ileus resolving- will increase Lactulose 12. Hiccups-?  Resolved 13. Thrombocytopenia  Platelets 106 on 9/19--> 127k 9/26  Continue to monitor 14.  Cervical stenosis  MRI showing multiple abnormalities  Appreciate neurosurgery recs, no plans for intervention at present 15.  NASH Cirrhosis    mild elevation of AST--> sl trend up 9/26 however--f/u later this week  10/2- labs tomorrow  10/3- Labs up more today 154 and 99- will recheck Thursday and if going up, will call GI.  16.  Hypoalbuminemia  Supplement initiated  17.  Hyperammonemia  Ammonia 121 on 9/22---> up to 145 9/26  -already receiving lactulose 349m5 x daily  -will ask GI for further recs to treat  9/28- will check KUB as to why cannot get her to poop- and get cleaned out.   9/30- Ammonia 118- will increase lactulose  10/2- Ammonia down to 38- no change in strength-   10/3- strength better today!  10/4- strength stable- will recheck Ammonia Thursday-  18.  Bilateral ankle heel cord tightness add PRAFO   9/27- con't PRAFOs- pt appears to be getting weaker? Will d/w therapy.  19. Ileus  9/29- will change to clear liquid diet and IVFs 75cc/hour x 1 days and give Sorbitol and enema to try and get her moving- making high ammonia  worse  10/2- will stop IVFs and change back to D1 thins diet.  20. Hypomagnesemia  9/29- will give MgOx 800 mg BID and recheck in AM- might need IV Mg.   9/30- will give IV Mg and recheck levels Sunday. Can  give IV 4G per d/w pharmacy  10/2- will recechk in AM- Mg 1.5  10/3- Mg 1.4- will repeat IV MG. Labs in AM  10/4- Mg up to 1.8!- con't PO meds 11. Hypokalemia  10/3- will replete KCL 40 mEq x2 and recheck in AM  10/4- K+ up to 4.2- con't to monitor 12. Raw buttocks  10/4- added gerhardt's and tucks.  13. Poor appetite  10/5- Will try Marinol 2.5 mg BID for appetite.    LOS: 22 days A FACE TO FACE EVALUATION WAS PERFORMED  Milanni Ayub 02/03/2021, 2:57 PM

## 2021-02-04 LAB — COMPREHENSIVE METABOLIC PANEL
ALT: 63 U/L — ABNORMAL HIGH (ref 0–44)
AST: 75 U/L — ABNORMAL HIGH (ref 15–41)
Albumin: 2 g/dL — ABNORMAL LOW (ref 3.5–5.0)
Alkaline Phosphatase: 138 U/L — ABNORMAL HIGH (ref 38–126)
Anion gap: 6 (ref 5–15)
BUN: 14 mg/dL (ref 6–20)
CO2: 24 mmol/L (ref 22–32)
Calcium: 8.9 mg/dL (ref 8.9–10.3)
Chloride: 109 mmol/L (ref 98–111)
Creatinine, Ser: 0.94 mg/dL (ref 0.44–1.00)
GFR, Estimated: >60 mL/min (ref >60)
Glucose, Bld: 204 mg/dL — ABNORMAL HIGH (ref 70–99)
Potassium: 4.7 mmol/L (ref 3.5–5.1)
Sodium: 139 mmol/L (ref 135–145)
Total Bilirubin: 2.7 mg/dL — ABNORMAL HIGH (ref 0.3–1.2)
Total Protein: 4.4 g/dL — ABNORMAL LOW (ref 6.5–8.1)

## 2021-02-04 LAB — GLUCOSE, CAPILLARY
Glucose-Capillary: 182 mg/dL — ABNORMAL HIGH (ref 70–99)
Glucose-Capillary: 192 mg/dL — ABNORMAL HIGH (ref 70–99)
Glucose-Capillary: 145 mg/dL — ABNORMAL HIGH (ref 70–99)
Glucose-Capillary: 216 mg/dL — ABNORMAL HIGH (ref 70–99)

## 2021-02-04 LAB — CBC WITH DIFFERENTIAL/PLATELET
Abs Immature Granulocytes: 0.03 10*3/uL (ref 0.00–0.07)
Basophils Absolute: 0 10*3/uL (ref 0.0–0.1)
Basophils Relative: 1 %
Eosinophils Absolute: 0.1 10*3/uL (ref 0.0–0.5)
Eosinophils Relative: 3 %
HCT: 27.4 % — ABNORMAL LOW (ref 36.0–46.0)
Hemoglobin: 9.8 g/dL — ABNORMAL LOW (ref 12.0–15.0)
Immature Granulocytes: 1 %
Lymphocytes Relative: 18 %
Lymphs Abs: 0.9 10*3/uL (ref 0.7–4.0)
MCH: 35.1 pg — ABNORMAL HIGH (ref 26.0–34.0)
MCHC: 35.8 g/dL (ref 30.0–36.0)
MCV: 98.2 fL (ref 80.0–100.0)
Monocytes Absolute: 0.3 10*3/uL (ref 0.1–1.0)
Monocytes Relative: 6 %
Neutro Abs: 3.5 10*3/uL (ref 1.7–7.7)
Neutrophils Relative %: 71 %
Platelets: 134 10*3/uL — ABNORMAL LOW (ref 150–400)
RBC: 2.79 MIL/uL — ABNORMAL LOW (ref 3.87–5.11)
RDW: 16.5 % — ABNORMAL HIGH (ref 11.5–15.5)
WBC: 4.9 10*3/uL (ref 4.0–10.5)
nRBC: 0 % (ref 0.0–0.2)

## 2021-02-04 LAB — MAGNESIUM: Magnesium: 1.6 mg/dL — ABNORMAL LOW (ref 1.7–2.4)

## 2021-02-04 MED ORDER — MAGNESIUM SULFATE 2 GM/50ML IV SOLN
2.0000 g | Freq: Once | INTRAVENOUS | Status: AC
  Administered 2021-02-04: 2 g via INTRAVENOUS
  Filled 2021-02-04: qty 50

## 2021-02-04 MED ORDER — FERROUS SULFATE 220 (44 FE) MG/5ML PO ELIX
220.0000 mg | ORAL_SOLUTION | Freq: Two times a day (BID) | ORAL | Status: DC
  Administered 2021-02-04 – 2021-02-08 (×9): 220 mg via ORAL
  Filled 2021-02-04 (×10): qty 5

## 2021-02-04 NOTE — Progress Notes (Signed)
Physical Therapy Session Note  Patient Details  Name: Meagan Peck MRN: [id number] Date of Birth: 1961-03-21  Today's Date: 02/04/2021 PT Individual Time: 1445-1525 PT Individual Time Calculation (min): 40 min   Short Term Goals: Week 4:  PT Short Term Goal 1 (Week 4): =LTG due to ELOS  Skilled Therapeutic Interventions/Progress Updates: Pt presented in bed with daughter Luetta Nutting present. Session focused on hands on family education in use of hoyer lift. Due to limited activity tolerance of pt, PTA explained information to pt and daughter and then had daughter use PTA as pt. Pt remained in bed for energy conservation for this session. In day room PTA explained mechanics of Harrel Lemon and how to use. PTA then had daughter lay on mat and position in sling. PTA raised daughter in sling to allow daughter to get sensation of lift. PTA then had daughter change roles and PTA laid on mat with daughter cueing and performing rolling to position sling. Daughter was also able to demonstrate safe mechanics while using [employer]. Daughter demonstrated good proficiency of using Hoyer lift with minimal cues needed. Daughter states felt comfortable using sling and no additional queries. Back in room pt resting comfortably and family present.      Therapy Documentation Precautions:  Precautions Precautions: Fall Precaution Comments: R sided weakness, fall risk Restrictions Weight Bearing Restrictions: No  Therapy/Group: Individual Therapy  Calani Gick Jerian Morais, PTA  02/04/2021, 4:28 PM

## 2021-02-04 NOTE — Progress Notes (Signed)
Speech Language Pathology Daily Session Note  Patient Details  Name: Meagan Peck MRN: [id number] Date of Birth: 1960-11-15  Today's Date: 02/04/2021 SLP Individual Time: 1400-1425 SLP Individual Time Calculation (min): 25 min  Short Term Goals: Week 3: SLP Short Term Goal 1 (Week 3): Patient will verbalize basic wants/needs and responses to questions at word level with maxA verbal cues for improving speech intelligibility. SLP Short Term Goal 2 (Week 3): Patient will tolerate trials of soft solids with SLP with minA cues for safety strategies. SLP Short Term Goal 3 (Week 3): SLP will determine if any benefit from basic level non-verbal communmication (communication board, etc) with modA.  Skilled Therapeutic Interventions:   Patient seen with daughter present in room for skilled ST session focusing on patient and family education. Patient was awake and alert but continues to be weak and with difficulty maintaining adequate alertness throughout. SLP provided education regarding conserving energy, maximizing communication with verbal (one word level) and gestural, communication board, etc. In addition, SLP discussed conserving energy with PO intake and daughter already had Dys 3 solids handout. SLP to provide patient and daughter with additional resources related to communication and dysphagia goals. Patient left in bed with daughter present in room. She continues to benefit from skilled SLP intervention to maximize communication and swallow function prior to discharge.  Pain Pain Assessment Pain Scale: 0-10 Pain Score: 0-No pain  Therapy/Group: Individual Therapy  Sonia Baller, MA, CCC-SLP Speech Therapy

## 2021-02-04 NOTE — Progress Notes (Signed)
Occupational Therapy Session Note  Patient Details  Name: Meagan Peck MRN: [id number] Date of Birth: January 09, 1961  Today's Date: 02/04/2021 OT Individual Time: 1300-1355 OT Individual Time Calculation (min): 55 min    Short Term Goals: Week 3:  OT Short Term Goal 1 (Week 3): Pt will roll R/L in bed with max A to facilitate dressing/bathing tasks at bed level OT Short Term Goal 2 (Week 3): Pt will perform self feeding with max A OT Short Term Goal 3 (Week 3): Pt will maintan sitting balance with max A+1 in preparation for BADLs and transfers  Skilled Therapeutic Interventions/Progress Updates:    Pt resting in bed upon arrival and daughter entered room during session. Pt incontinent of bladder and brief changed with pt assisting with rolling R/L. Pt min A rolling R/L using bed rails. Pt able to flex Bil knees in preparation for rolling. Oral care provided for pt. Daughter educated on positioning and body mechanics. Pt incontinent of bladder but unable to detect when she has voided. Recommended daughter check pt every 2-4 hours to help prevent UTI and skin breakdown. Educated daughter on positioning in side lying to help prevent skin breakdown. Recommended purchasing a baby monitor since pt unable to activate bell or other type device. Daughter verbalized understanding.    Therapy Documentation Precautions:  Precautions Precautions: Fall Precaution Comments: R sided weakness, fall risk Restrictions Weight Bearing Restrictions: No Pain: Pt grimaces ith RUE PROM; pt states she feels a "stretch" along triceps; repositioned   Therapy/Group: Individual Therapy  Leroy Libman 02/04/2021, 2:45 PM

## 2021-02-04 NOTE — Progress Notes (Signed)
Pt is on chronic mag supplementation but her mg trended down to 1.6 today. D/w [place] and we will give a supplement dose of 2g today.   Onnie Boer, PharmD, BCIDP, AAHIVP, CPP Infectious Disease Pharmacist 02/04/2021 12:45 PM

## 2021-02-04 NOTE — Progress Notes (Signed)
[place] PHYSICAL MEDICINE & REHABILITATION PROGRESS NOTE  Subjective/Complaints:   Pt reports feeling better than yesterday- ate banana and boiled egg for breakfast.  Still refusing lactulose regularly.   LBM last night and this AM per pt.   ROS: limited by cognition and dysarthria   Objective: Vital Signs: Blood pressure 133/61, pulse 95, temperature 98.6 F (37 C), temperature source Oral, resp. rate 20, height 4' 10"  (1.473 m), weight 59.7 kg, last menstrual period 12/03/2013, SpO2 99 %. No results found. Recent Labs    02/04/21 0510  WBC 4.9  HGB 9.8*  HCT 27.4*  PLT 134*     Recent Labs    02/02/21 0420 02/04/21 0510  NA 138 139  K 4.2 4.7  CL 109 109  CO2 24 24  GLUCOSE 52* 204*  BUN 10 14  CREATININE 0.63 0.94  CALCIUM 8.1* 8.9      Intake/Output Summary (Last 24 hours) at 02/04/2021 1057 Last data filed at 02/04/2021 0800 Gross per 24 hour  Intake 270 ml  Output --  Net 270 ml        Physical Exam: BP 133/61 (BP Location: Left Arm)   Pulse 95   Temp 98.6 F (37 C) (Oral)   Resp 20   Ht 4' 10"  (1.473 m)   Wt 59.7 kg   LMP 12/03/2013 (LMP Unknown)   SpO2 99%   BMI 27.51 kg/m           General: awake, alert, appropriate, laying supine in bed; NAD HENT: dysconjugate gaze; oropharynx moist CV: regular rate; no JVD Pulmonary: CTA B/L; no W/R/R- good air movement GI: soft, NT, ND, (+)BS Psychiatric: appropriate- not crying today- Neurological: alert; dysarthric- no change in dysarthria however RUE- 2+/5 and RLE 3/5 in DF and PF- which is [facility] better Not real functional so far.  Ext: no clubbing, cyanosis, or edema Psych: flat but cooperative, delayed  Skin: No evidence of breakdown, no evidence of rash  Neuro: Alert, dysconjugate gaze. Follows basic commands. Dysarthric, delayed. Motor: RUE: 0/5 proximal distal,  RLE: 0/5 proximal distal- no change today on R side ~ 3+/5 on L side Sensation diminished to light touch right  hand, stable MSK- pain right shoulder with IR. Heel cords tight   Assessment/Plan: 1. Functional deficits which require 3+ hours per day of interdisciplinary therapy in a comprehensive inpatient rehab setting. Physiatrist is providing close team supervision and 24 hour management of active medical problems listed below. Physiatrist and rehab team continue to assess barriers to discharge/monitor patient progress toward functional and medical goals   Care Tool:  Bathing    Body parts bathed by patient: Chest, Abdomen   Body parts bathed by helper: Right arm, Left arm, Front perineal area, Buttocks, Right upper leg, Left upper leg, Right lower leg, Left lower leg, Face     Bathing assist Assist Level: Total Assistance - Patient < 25%     Upper Body Dressing/Undressing Upper body dressing   What is the patient wearing?: Pull over shirt    Upper body assist Assist Level: Total Assistance - Patient < 25%    Lower Body Dressing/Undressing Lower body dressing      What is the patient wearing?: Incontinence brief     Lower body assist Assist for lower body dressing: Dependent - Patient 0%     Toileting Toileting    Toileting assist Assist for toileting: Dependent - Patient 0%     Transfers Chair/bed transfer  Transfers assist  Chair/bed transfer assist level: 2 Helpers     Locomotion Ambulation   Ambulation assist   Ambulation activity did not occur: Safety/medical concerns (pt became emotional, did not have time for further mobility)  Assist level: Minimal Assistance - Patient > 75% Assistive device: Walker-rolling Max distance: 50'   Walk 10 feet activity   Assist  Walk 10 feet activity did not occur: Safety/medical concerns  Assist level: Minimal Assistance - Patient > 75% Assistive device: Walker-rolling   Walk 50 feet activity   Assist Walk 50 feet with 2 turns activity did not occur: Safety/medical concerns  Assist level: Minimal  Assistance - Patient > 75% Assistive device: Walker-rolling    Walk 150 feet activity   Assist Walk 150 feet activity did not occur: Safety/medical concerns         Walk 10 feet on uneven surface  activity   Assist Walk 10 feet on uneven surfaces activity did not occur: Safety/medical concerns         Wheelchair     Assist Is the patient using a wheelchair?: Yes Type of Wheelchair: Manual Wheelchair activity did not occur: Safety/medical concerns  Wheelchair assist level: Dependent - Patient 0%      Wheelchair 50 feet with 2 turns activity    Assist    Wheelchair 50 feet with 2 turns activity did not occur: Safety/medical concerns       Wheelchair 150 feet activity     Assist  Wheelchair 150 feet activity did not occur: Safety/medical concerns        Medical Problem List and Plan: 1.  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  Appreciate neurology follow up. No new neuro intereventions recommended 10/4- will check with team if strength translates to functional changes- con't PT and OT- currently 1x/day.  10/5- changed d/c date to 10/10- will need ambulance to get home- con't PT and OT- CIR- doesn't have enough strength to translate into anything functional.   10/6- con't PT and OT- d/c 10/10 2.  Antithrombotics: -DVT/anticoagulation: SCDs 9/28- Plts 127- con't  to monitor  Pharmaceutical: Other (comment)             -antiplatelet therapy: N/A 3. Pain Management: Tylenol d/ced given hepatic failure neurogenic pain over chest and abd bilateral , has some signal abnormality in thalamus bilaterally   Baclofen 5 BID started on 9/20, changed to nightly on 9/21 due to  lethargy, d/ced on 9/23 d/t lethargy 4. Mood: Provide emotional support             -antipsychotic agents: N/A 5. Neuropsych: This patient is not  Fully capable of making decisions on her own behalf. Some emotional lability as expected given extensive brain stem lesion   9/27- will call palliative care to discuss pt's care. 9/28- consulted palliative care- spoke to them- wants ot determine goals of care.  9/29- they attempted to see, but couldn't speak with pt and couldn't reach husband. Willc on't to try  9/30- pallative care saw pt- made DNR- she wants ot go home- had pt see Neuropsych today as well.   10/4- will send home on Hospice.   10/5- not eating- concerned and let pt know this will cause problems with life expectancy. Will start Celexa 20 mg daily for depression 6.  Skin/Wound Care: Routine skin checks 7. Fluids/Electrolytes/Nutrition:   I personally reviewed the patient's labs today.   8.  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  Neurology follow-up conservative care  9/29- spoke with Neuro- they think it's made worse by high ammonia- Ammonia 113 today- will try to get her to go/have multiple BM's- complicated by ileus.   9/30- Ammonia 118- working on increasing lactulose. 10/2- Ammonia down to 38- MUCH better- and she looks better/less dysarthric; no change in strength that I can see, though- 10/3- strength much better in RUE/RLE this AM- will con't lactulose and Rafaxamin.   9.  Uncontrolled diabetes mellitus with hyperglycemia.  Hemoglobin A1c 10.7.   NovoLog 12 units 3 times daily, increase to 14 units on 9/16 Semglee 20 units twice daily.  Diabetic teaching- couldn't afford insulin at home.  CBG (last 3)  Recent Labs    02/03/21 1607 02/03/21 2127 02/04/21 0541  GLUCAP 170* 135* 182*  9/26 increase semglee to 22u bid  9/30- BG's controlled- con't regimen  10/2- Bgs 50s-60s this AM- will decrease Semglee to 20 units BID  10/3- will reduce Semglee to 18 units  BID.   10/4- not eating as much- decrease mglee to 15 units BID and Novolog to 12 units with meals.   10/5- reducing Novolog to 6 units and Semglee to 8 units BID since NOT eating basically at all. 10/6- BG's better- 135-182- also eating some today- con't to monitor  10.  Acute on chronic anemia.  Continue iron supplement.    Hemoglobin 11.1 on 9/19--> 10.6 9/26  F/u Monday 11. Constipation in setting of cirrhosis: Cont chronulac 37m 5x daily  9/27- LBM yesterday small x1- will give sorbitol x1 today and con't lactulose- also on Rifaxamin.  9/30- ileus resolving- will increase Lactulose 12. Hiccups-?  Resolved 13. Thrombocytopenia  Platelets 106 on 9/19--> 127k 9/26  Continue to monitor 14.  Cervical stenosis  MRI showing multiple abnormalities  Appreciate neurosurgery recs, no plans for intervention at present 15.  NASH Cirrhosis    mild elevation of AST--> sl trend up 9/26 however--f/u later this week  10/2- labs tomorrow  10/3- Labs up more today 154 and 99- will recheck Thursday and if going up, will call GI.   10/6- ALT/AST 75/63- doing better- con't regimen 16.  Hypoalbuminemia  Supplement initiated  17.  Hyperammonemia  Ammonia 121 on 9/22---> up to 145 9/26  -already receiving lactulose 366m5 x daily  -will ask GI for further recs to treat  9/28- will check KUB as to why cannot get her to poop- and get cleaned out.   9/30- Ammonia 118- will increase lactulose  10/2- Ammonia down to 38- no change in strength-   10/3- strength better today!  10/4- strength stable- will recheck Ammonia Thursday-   10/6- strength stable- still pooping, but refusing lactulose.  18.  Bilateral ankle heel cord tightness add PRAFO   9/27- con't PRAFOs- pt appears to be getting weaker? Will d/w therapy.  19. Ileus  9/29- will change to clear liquid diet and IVFs 75cc/hour x 1 days and give Sorbitol and enema to try and get her moving- making high ammonia worse  10/2- will stop IVFs and change  back to D1 thins diet.  20. Hypomagnesemia  9/29- will give MgOx 800 mg BID and recheck in AM- might need IV Mg.   9/30- will give IV Mg and recheck levels Sunday. Can give IV 4G per d/w pharmacy  10/2- will recechk  in AM- Mg 1.5  10/3- Mg 1.4- will repeat IV MG. Labs in AM  10/4- Mg up to 1.8!- con't PO meds 11. Hypokalemia  10/3- will replete KCL 40 mEq x2 and recheck in AM  10/4- K+ up to 4.2- con't to monitor 12. Raw buttocks  10/4- added gerhardt's and tucks.  13. Poor appetite  10/5- Will try Marinol 2.5 mg BID for appetite.    LOS: 23 days A FACE TO FACE EVALUATION WAS PERFORMED  Melady Chow 02/04/2021, 10:57 AM

## 2021-02-04 NOTE — Progress Notes (Signed)
Occupational Therapy Discharge Summary  Patient Details  Name: Meagan Peck MRN: [id number] Date of Birth: Sep 23, 1960    Patient has met 1 of 4 long term goals.  Patient to discharge at overall Total Assist level.  Patient discharging home on hospice with total care.  Reasons goals not met: Patient with functional decline and discharging home on hospice for end of life care.  Recommendation:  No further OT follow up at this time .  Equipment: No equipment provided pt receiving hospice services  Reasons for discharge: discharge from hospital  Patient/family agrees with progress made and goals achieved: Yes  OT Discharge   Vision Baseline Vision/History: 1 Wears glasses Patient Visual Report: No change from baseline Vision Assessment?: No apparent visual deficits Perception  Perception: Within Functional Limits Praxis Praxis: Intact Cognition Overall Cognitive Status: Within Functional Limits for tasks assessed Arousal/Alertness: Awake/alert Orientation Level: Oriented X4 Immediate Memory Recall: Sock;Blue;Bed Memory Recall Sock: Without Cue Memory Recall Blue: Without Cue Memory Recall Bed: Without Cue Awareness: Appears intact Problem Solving: Appears intact Safety/Judgment: Appears intact Sensation Sensation Light Touch: Appears Intact Hot/Cold: Appears Intact Proprioception: Impaired by gross assessment Coordination Gross Motor Movements are Fluid and Coordinated: No Fine Motor Movements are Fluid and Coordinated: No Coordination and Movement Description: grossly uncoordinated d/t weakness Finger Nose Finger Test: Unable to complete Motor  Motor Motor: Hemiplegia Trunk/Postural Assessment  Cervical Assessment Cervical Assessment: Exceptions to [facility] Thoracic Assessment Thoracic Assessment: Exceptions to [facility] Lumbar Assessment Lumbar Assessment: Exceptions to [facility] (posterior pelvic tilt) Postural Control Trunk Control: poor trunk control   Balance Static Sitting Balance Static Sitting - Level of Assistance: 1: +1 Total assist Static Standing Balance Static Standing - Level of Assistance: 1: +2 Total assist Extremity/Trunk Assessment RUE Assessment RUE Assessment: Exceptions to [facility] Passive Range of Motion (PROM) Comments: shoulder ~90*; elbow, wrist, and hand WNL General Strength Comments: grossly 1/5 LUE Assessment LUE Assessment: Exceptions to [facility] Passive Range of Motion (PROM) Comments: WNL General Strength Comments: roughly 3-/5, generalized weakness limiting   Leroy Libman 02/04/2021, 8:59 AM

## 2021-02-05 DIAGNOSIS — K7682 Hepatic encephalopathy: Secondary | ICD-10-CM

## 2021-02-05 DIAGNOSIS — K567 Ileus, unspecified: Secondary | ICD-10-CM

## 2021-02-05 LAB — GLUCOSE, CAPILLARY
Glucose-Capillary: 92 mg/dL (ref 70–99)
Glucose-Capillary: 140 mg/dL — ABNORMAL HIGH (ref 70–99)
Glucose-Capillary: 146 mg/dL — ABNORMAL HIGH (ref 70–99)

## 2021-02-05 MED ORDER — INSULIN GLARGINE-YFGN 100 UNIT/ML ~~LOC~~ SOLN
10.0000 [IU] | Freq: Two times a day (BID) | SUBCUTANEOUS | Status: DC
  Administered 2021-02-05 – 2021-02-07 (×4): 10 [IU] via SUBCUTANEOUS
  Filled 2021-02-05 (×5): qty 0.1

## 2021-02-05 NOTE — Progress Notes (Signed)
Speech Language Pathology Daily Session Note  Patient Details  Name: Meagan Peck MRN: [id number] Date of Birth: 14-Aug-1960  Today's Date: 02/05/2021 SLP Individual Time: 0413-6438 SLP Individual Time Calculation (min): 30 min  Short Term Goals: Week 3: SLP Short Term Goal 1 (Week 3): Patient will verbalize basic wants/needs and responses to questions at word level with maxA verbal cues for improving speech intelligibility. SLP Short Term Goal 2 (Week 3): Patient will tolerate trials of soft solids with SLP with minA cues for safety strategies. SLP Short Term Goal 3 (Week 3): SLP will determine if any benefit from basic level non-verbal communmication (communication board, etc) with modA.  Skilled Therapeutic Interventions:   Patient seen for skilled ST session focusing on speech production, trial of communication board and patient education. She reported that she ate all of her breakfast this morning. During verbal production, patient's speech intelligibility was adequate at 1-2 word level when patient slowing down speech rate and increasing vocal intensity. SLP showed patient communication board pictures with categories for wants/needs and she demonstrated understanding of purpose. (For when she is unable to verbalize needs). She was in a pleasant mood and did not exhibit any emotional lability as has been seen in previous sessions. She was left in bed with all needs within reach. She continues to benefit from skilled SLP intervention to maximize speech and swallow function prior to discharge.  Pain Pain Assessment Pain Scale: 0-10 Pain Score: 0-No pain  Therapy/Group: Individual Therapy  Sonia Baller, MA, CCC-SLP Speech Therapy

## 2021-02-05 NOTE — Progress Notes (Signed)
Occupational Therapy Session Note  Patient Details  Name: Meagan Peck MRN: [id number] Date of Birth: 1961-03-23  Today's Date: 02/05/2021 OT Individual Time: 2637-8588 OT Individual Time Calculation (min): 30 min  and Today's Date: 02/05/2021 OT Missed Time: 15 Minutes Missed Time Reason: Patient fatigue   Short Term Goals: Week 3:  OT Short Term Goal 1 (Week 3): Pt will roll R/L in bed with max A to facilitate dressing/bathing tasks at bed level OT Short Term Goal 2 (Week 3): Pt will perform self feeding with max A OT Short Term Goal 3 (Week 3): Pt will maintan sitting balance with max A+1 in preparation for BADLs and transfers  Skilled Therapeutic Interventions/Progress Updates:    Pt greeted supine in bed with pillows under sides for pressure relief and comfort. Pt declined to wash up or change her gown. Gentle ROM completed on B UE's with some activation noted in R UE. Pt was able to actively assist with shoulder flex/ext, elbow flex/ext, wrist flex/ext, and finger flex/ext with L UE. 3 sets of 10 of each. OT then placed wash cloth in pt's L hand and she was able to bring wash cloth to face with guided A to wash face. Pt fatigued and left semi-reclined in bed with call bell in reach and needs met.  Therapy Documentation Precautions:  Precautions Precautions: Fall Precaution Comments: R sided weakness, fall risk Restrictions Weight Bearing Restrictions: No General: General OT Missed Treatment Reason: Patient fatigue Vital Signs:  Pain:  No pain number given, but reports pain in R shoulder with ROM. Rest and repositioned for comfort.   Therapy/Group: Individual Therapy  Valma Cava 02/05/2021, 11:48 AM

## 2021-02-05 NOTE — Progress Notes (Signed)
[place] PHYSICAL MEDICINE & REHABILITATION PROGRESS NOTE  Subjective/Complaints:   Pt still refusing her lactulose- did have 3 Bms yesterday and 1 today so far.  Daughter who was at bedside for family training- educated her on pt's appetite, BG's and depression.   Answered all questions about her dx- sending home Monday by ambulance/with hospice.    ROS: limited by cognition and dysarthria, but is somewhat better Objective: Vital Signs: Blood pressure (!) 123/53, pulse 71, temperature 98.3 F (36.8 C), temperature source Oral, resp. rate 14, height 4' 10"  (1.473 m), weight 59.7 kg, last menstrual period 12/03/2013, SpO2 98 %. No results found. Recent Labs    02/04/21 0510  WBC 4.9  HGB 9.8*  HCT 27.4*  PLT 134*     Recent Labs    02/04/21 0510  NA 139  K 4.7  CL 109  CO2 24  GLUCOSE 204*  BUN 14  CREATININE 0.94  CALCIUM 8.9      Intake/Output Summary (Last 24 hours) at 02/05/2021 1240 Last data filed at 02/05/2021 0803 Gross per 24 hour  Intake 490 ml  Output --  Net 490 ml        Physical Exam: BP (!) 123/53 (BP Location: Right Arm)   Pulse 71   Temp 98.3 F (36.8 C) (Oral)   Resp 14   Ht 4' 10"  (1.473 m)   Wt 59.7 kg   LMP 12/03/2013 (LMP Unknown)   SpO2 98%   BMI 27.51 kg/m            General: awake, alert, appropriate, sitting up slightly in bed; happy daughter is here; NAD HENT: dysconjugate gaze; oropharynx moist- being fed by staff CV: regular rate; no JVD Pulmonary: CTA B/L; no W/R/R- good air movement GI: soft, NT, ND, (+)BS- hyperactive Psychiatric: appropriate- happy today Neurological:   Alert- dysarthric much better today- could understand on first time; not 3-4th time; however RUE- 2+/5 and RLE 3/5 in DF and PF- which is [facility] better Not real functional so far.  Ext: no clubbing, cyanosis, or edema Psych: flat but cooperative, delayed  Skin: No evidence of breakdown, no evidence of rash  Neuro: Alert, dysconjugate  gaze. Follows basic commands. Dysarthric, delayed. Motor: RUE: 0/5 proximal distal,  RLE: 0/5 proximal distal- no change today on R side ~ 3+/5 on L side Sensation diminished to light touch right hand, stable MSK- pain right shoulder with IR. Heel cords tight   Assessment/Plan: 1. Functional deficits which require 3+ hours per day of interdisciplinary therapy in a comprehensive inpatient rehab setting. Physiatrist is providing close team supervision and 24 hour management of active medical problems listed below. Physiatrist and rehab team continue to assess barriers to discharge/monitor patient progress toward functional and medical goals   Care Tool:  Bathing    Body parts bathed by patient: Chest, Abdomen   Body parts bathed by helper: Right arm, Left arm, Front perineal area, Buttocks, Right upper leg, Left upper leg, Right lower leg, Left lower leg, Face     Bathing assist Assist Level: Total Assistance - Patient < 25%     Upper Body Dressing/Undressing Upper body dressing   What is the patient wearing?: Pull over shirt    Upper body assist Assist Level: Total Assistance - Patient < 25%    Lower Body Dressing/Undressing Lower body dressing      What is the patient wearing?: Incontinence brief     Lower body assist Assist for lower body dressing: Dependent -  Patient 0%     Chartered loss adjuster assist Assist for toileting: Dependent - Patient 0%     Transfers Chair/bed transfer  Transfers assist     Chair/bed transfer assist level: 2 Helpers     Locomotion Ambulation   Ambulation assist   Ambulation activity did not occur: Safety/medical concerns  Assist level: Minimal Assistance - Patient > 75% Assistive device: Walker-rolling Max distance: 50'   Walk 10 feet activity   Assist  Walk 10 feet activity did not occur: Safety/medical concerns  Assist level: Minimal Assistance - Patient > 75% Assistive device: Walker-rolling   Walk  50 feet activity   Assist Walk 50 feet with 2 turns activity did not occur: Safety/medical concerns  Assist level: Minimal Assistance - Patient > 75% Assistive device: Walker-rolling    Walk 150 feet activity   Assist Walk 150 feet activity did not occur: Safety/medical concerns         Walk 10 feet on uneven surface  activity   Assist Walk 10 feet on uneven surfaces activity did not occur: Safety/medical concerns         Wheelchair     Assist Is the patient using a wheelchair?: Yes Type of Wheelchair: Manual Wheelchair activity did not occur: Safety/medical concerns  Wheelchair assist level: Dependent - Patient 0%      Wheelchair 50 feet with 2 turns activity    Assist    Wheelchair 50 feet with 2 turns activity did not occur: Safety/medical concerns   Assist Level: Dependent - Patient 0%   Wheelchair 150 feet activity     Assist  Wheelchair 150 feet activity did not occur: Safety/medical concerns   Assist Level: Dependent - Patient 0%    Medical Problem List and Plan: 1.  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  Appreciate neurology follow up. No new neuro intereventions recommended 10/4- will check with team if strength translates to functional changes- con't PT and OT- currently 1x/day.  10/5- changed d/c date to 10/10- will need ambulance to get home- con't PT and OT- CIR- doesn't have enough strength to translate into anything functional.  10/7- con't PT and OT- family training today- and maybe more- d/c 10/10 by ambulance with hospice.  2.  Antithrombotics: -DVT/anticoagulation: SCDs 9/28- Plts 127- con't  to monitor  Pharmaceutical: Other (comment)              -antiplatelet therapy: N/A 3. Pain Management: Tylenol d/ced given hepatic failure neurogenic pain over chest and abd bilateral , has some signal abnormality in thalamus bilaterally   Baclofen 5 BID started on 9/20, changed to nightly on 9/21 due to lethargy, d/ced on 9/23 d/t lethargy 4. Mood: Provide emotional support             -antipsychotic agents: N/A 5. Neuropsych: This patient is not  Fully capable of making decisions on her own behalf. Some emotional lability as expected given extensive brain stem lesion   9/27- will call palliative care to discuss pt's care. 9/28- consulted palliative care- spoke to them- wants ot determine goals of care.  9/29- they attempted to see, but couldn't speak with pt and couldn't reach husband. Willc on't to try  9/30- pallative care saw pt- made DNR- she wants ot go  home- had pt see Neuropsych today as well.   10/4- will send home on Hospice.   10/5- not eating- concerned and let pt know this will cause problems with life expectancy. Will start Celexa 20 mg daily for depression 6. Skin/Wound Care: Routine skin checks 7. Fluids/Electrolytes/Nutrition:   I personally reviewed the patient's labs today.   8.  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  Neurology follow-up conservative care  9/29- spoke with Neuro- they think it's made worse by high ammonia- Ammonia 113 today- will try to get her to go/have multiple BM's- complicated by ileus.   9/30- Ammonia 118- working on increasing lactulose. 10/2- Ammonia down to 38- MUCH better- and she looks better/less dysarthric; no change in strength that I can see, though- 10/3- strength much better in RUE/RLE this AM- will con't lactulose and Rafaxamin.   9.  Uncontrolled diabetes mellitus with hyperglycemia.  Hemoglobin A1c 10.7.   NovoLog 12 units 3 times daily, increase to 14 units on 9/16 Semglee 20 units twice daily.  Diabetic teaching- couldn't afford insulin at home.  CBG (last 3)  Recent  Labs    02/04/21 2053 02/05/21 0602 02/05/21 1147  GLUCAP 216* 92 140*  9/26 increase semglee to 22u bid  9/30- BG's controlled- con't regimen  10/2- Bgs 50s-60s this AM- will decrease Semglee to 20 units BID  10/3- will reduce Semglee to 18 units BID.   10/4- not eating as much- decrease mglee to 15 units BID and Novolog to 12 units with meals.   10/5- reducing Novolog to 6 units and Semglee to 8 units BID since NOT eating basically at all. 10/6- BG's better- 135-182- also eating some today- con't to monitor  10/7- will increase Semglee- to 10 units BID- told daughter will probably need to titrate once gets home and hopefully eats more.  10.  Acute on chronic anemia.  Continue iron supplement.    Hemoglobin 11.1 on 9/19--> 10.6 9/26  F/u Monday 11. Constipation in setting of cirrhosis: Cont chronulac 58m 5x daily  9/27- LBM yesterday small x1- will give sorbitol x1 today and con't lactulose- also on Rifaxamin.  9/30- ileus resolving- will increase Lactulose 10/7- pt refusing lactulose, but had 3 Bms yesterday- asked pt/daughter to make sure she goes 3-4x/day.  12. Hiccups-?  Resolved 13. Thrombocytopenia  Platelets 106 on 9/19--> 127k 9/26  Continue to monitor 14.  Cervical stenosis  MRI showing multiple abnormalities  Appreciate neurosurgery recs, no plans for intervention at present 15.  NASH Cirrhosis    mild elevation of AST--> sl trend up 9/26 however--f/u later this week  10/2- labs tomorrow  10/3- Labs up more today 154 and 99- will recheck Thursday and if going up, will call GI.   10/6- ALT/AST 75/63- doing better- con't regimen 16.  Hypoalbuminemia  Supplement initiated  17.  Hyperammonemia  Ammonia 121 on 9/22---> up to 145 9/26  -already receiving lactulose 356m5 x daily  -will ask GI for further recs to treat  9/28- will check KUB as to why cannot get her to poop- and get cleaned out.   9/30- Ammonia 118- will increase lactulose  10/2- Ammonia down to 38- no  change in strength-   10/3- strength better today!  10/4- strength stable- will recheck Ammonia Thursday-   10/6- strength stable- still pooping, but refusing lactulose.  18.  Bilateral ankle heel cord tightness add PRAFO   9/27- con't PRAFOs- pt appears to be getting weaker? Will d/w therapy.  19.  Ileus  9/29- will change to clear liquid diet and IVFs 75cc/hour x 1 days and give Sorbitol and enema to try and get her moving- making high ammonia worse  10/2- will stop IVFs and change back to D1 thins diet.   resolved 20. Hypomagnesemia  9/29- will give MgOx 800 mg BID and recheck in AM- might need IV Mg.   9/30- will give IV Mg and recheck levels Sunday. Can give IV 4G per d/w pharmacy  10/2- will recechk in AM- Mg 1.5  10/3- Mg 1.4- will repeat IV MG. Labs in AM  10/4- Mg up to 1.8!- con't PO meds  10/7- Mg 1.6- is chronically low- wil con't repletion.  11. Hypokalemia  10/3- will replete KCL 40 mEq x2 and recheck in AM  10/4- K+ up to 4.2- con't to monitor 12. Raw buttocks  10/4- added gerhardt's and tucks.  13. Poor appetite  10/5- Will try Marinol 2.5 mg BID for appetite.    LOS: 24 days A FACE TO FACE EVALUATION WAS PERFORMED  Meagan Peck 02/05/2021, 12:40 PM

## 2021-02-05 NOTE — Progress Notes (Signed)
Physical Therapy Session Note  Patient Details  Name: Meagan Peck MRN: [id number] Date of Birth: 02/20/1961  Today's Date: 02/05/2021 PT Individual Time: 22 - 76     Short Term Goals: Week 4:  PT Short Term Goal 1 (Week 4): =LTG due to ELOS  Skilled Therapeutic Interventions/Progress Updates: Pt presented in bed agreeable to therapy. Pt noted to look more alert and brighter this am as well as speech slightly clearer. Pt only c/o sone pain in R shoulder did not rate. Some grimacing towards end of session with ROM. Pt noted to be incontinent of urine therefore session focused on bed mobility to permit PTA to change brief. Pt was able to roll L/R with mod A as pt was able to flex knees and initiate rolling 75% of way. PTA assisted at hip and shoulders to complete roll to sidelying. PTA performed peri-care total assist and donned new brief. Pt was able to roll back into supine without assist. PTA then performed heel cord stretch 30sec x 3 bilaterally and pt participated in AAROM fading to PROM with fatigue for knee flexion/extension, and hip ER/IR bilaterally. Pt was able to perform ankle pumps independently. PTA also performed PROM to RUE per pt request. Once completed pt left resting comfortably with soft call bell positioned so that pt able to activate, bed alarm on, and current needs met.      Therapy Documentation Precautions:  Precautions Precautions: Fall Precaution Comments: R sided weakness, fall risk Restrictions Weight Bearing Restrictions: No General:   Vital Signs: Therapy Vitals Temp: 98.1 F (36.7 C) Temp Source: Oral Pulse Rate: 74 Resp: 14 BP: (!) 120/54 (reported by students) Patient Position (if appropriate): Sitting Oxygen Therapy SpO2: 100 % O2 Device: Room Air Pain:   Mobility:   Locomotion :    Trunk/Postural Assessment :    Balance:   Exercises:   Other Treatments:      Therapy/Group: Individual Therapy  Cayleb Jarnigan 02/05/2021, 1:06  PM

## 2021-02-05 NOTE — Progress Notes (Addendum)
Patient ID: Meagan Peck, female   DOB: 1960-09-23, 60 y.o.   MRN: [id number]  SW made efforts to make contact with pt dtr Meagan Peck to arrange d/c to home with hospice services, and waiting on preferred location. SW aware that she may not answer since she had oral surgery today. SW informed medical team on possible delay in discharge if unable to make contact with pt dtr.   [facility] does not service pt area. SW will wait to hear from pt dtr to arrange.  *SW returned phone call to patient dtr Meagan Peck to discuss preferred hospice location. SW provided her with a link to hospice agencies servicing area. SW explained d/c process with regard to her mother transitioning home with hospice. SW informed her to follow-up once she decides on an agency.   SW received return phone call from pt dtr confirming they prefer [facility] and [place] [phone]) . SW spoke with Technical sales engineer. SW faxed clinical documentation to support services. Will follow-up if able to accept and states it will be a co-worker that is to f/u. SW informed not here over the weekend but encouraged to leave a message if able to accept pt and SW will f/u on Monday. SW informed on potential DME needs in the event referral is accepted.   SW faxed referral to referral coordinators.  Meagan Peck, MSW, [place] Office: [phone] Cell: [phone] Fax: [phone]

## 2021-02-06 LAB — GLUCOSE, CAPILLARY
Glucose-Capillary: 84 mg/dL (ref 70–99)
Glucose-Capillary: 94 mg/dL (ref 70–99)
Glucose-Capillary: 64 mg/dL — ABNORMAL LOW (ref 70–99)
Glucose-Capillary: 97 mg/dL (ref 70–99)
Glucose-Capillary: 153 mg/dL — ABNORMAL HIGH (ref 70–99)

## 2021-02-06 NOTE — Discharge Summary (Signed)
Physician Discharge Summary  Patient ID: Meagan Peck MRN: [id number] DOB/AGE: 58-Jul-1962 58 y.o.  Admit date: 01/12/2021 Discharge date: 02/08/2021  Discharge Diagnoses:  Principal Problem:   Demyelinating disease of central nervous system, unspecified ([place]) Active Problems:   Hepatic encephalopathy   Acute on chronic anemia   Uncontrolled type 2 diabetes mellitus with hyperglycemia ([place])   Spinal stenosis in cervical region   Transaminitis   Hypoalbuminemia due to protein-calorie malnutrition (HCC)   Muscle tightness   Expressive aphasia   Ileus (HCC)   Goals of care, counseling/discussion NASH cirrhosis  Discharged Condition: Guarded  Significant Diagnostic Studies: DG Abd 1 View  Result Date: 01/29/2021 CLINICAL DATA:  Abdominal distension and pain rule out ileus EXAM: ABDOMEN - 1 VIEW COMPARISON:  01/27/2021 FINDINGS: Mild distention of large and small bowel loops has improved in the interval. No bowel wall edema. Small amount of stool in the colon. Tips noted right upper quadrant. IMPRESSION: Improving ileus. Electronically Signed   By: Franchot Gallo M.D.   On: 01/29/2021 11:46   DG Abd 1 View  Result Date: 01/27/2021 CLINICAL DATA:  Constipation EXAM: ABDOMEN - 1 VIEW COMPARISON:  09/15/2020 FINDINGS: mild gaseous distention of both large and small bowel may reflect mild ileus. No organomegaly, free air or suspicious calcification. Tips stent noted in the right upper quadrant. Prior cholecystectomy. IMPRESSION: Mild gaseous distention of bowel diffusely likely mild ileus. Electronically Signed   By: Rolm Baptise M.D.   On: 01/27/2021 12:41   MR BRAIN WO CONTRAST  Result Date: 01/21/2021 CLINICAL DATA:  MS, osmotic demyelination EXAM: MRI HEAD WITHOUT CONTRAST TECHNIQUE: Multiplanar, multiecho pulse sequences of the brain and surrounding structures were obtained without intravenous contrast. COMPARISON:  01/10/2021 FINDINGS: Brain: Redemonstrated abnormal diffusion and T2  hyperintense signal symmetrically throughout the pons, sparing the periphery and bilateral corticospinal tracts (series 5, image 70 and series 11, image 10). T2 hyperintense signal is also now more pronounced in the middle cerebellar peduncles bilaterally (series 10, image 9), midbrain (series 10, image 13), bilateral thalami and internal capsules (series 10, image 14), and extending into the bilateral corona radiata (series 10, image 18). In the pons, the signal is associated with T1 hypointensity (series 16, image 18); additional T2 hyperintense signal locations are not associated with T1 hypointensity. Similar mild expansion of the pons. No associated hemorrhage. No mass effect or midline shift. Ventricles and sulci are normal for age. Additional scattered T2 hyperintense foci in the subcortical white matter may be the sequela of chronic small vessel ischemic disease. Vascular: Normal flow voids. Skull and upper cervical spine: No acute osseous abnormality. C4-C5 degenerative changes, with mild mass effect on the cord. Sinuses/Orbits: Mucosal thickening in the left sphenoid sinus. Status post bilateral lens replacements. Other: Trace fluid in right mastoid air cells. IMPRESSION: Redemonstrated findings concerning for osmotic demyelination, with redemonstrated T2 hyperintense and T1 hypointense signal in the pons, sparing the bilateral corticospinal tracts. Involvement of the basal ganglia, midbrain and subcortical white matter also support a diagnosis osmotic demyelination. Electronically Signed   By: Merilyn Baba M.D.   On: 01/21/2021 01:14   MR BRAIN WO CONTRAST  Addendum Date: 01/10/2021   ADDENDUM REPORT: 01/10/2021 10:27 ADDENDUM: Study discussed by telephone with Dr. Florencia Reasons on 01/10/2021 at 1022 hours. Electronically Signed   By: Genevie Ann M.D.   On: 01/10/2021 10:27   Result Date: 01/10/2021 CLINICAL DATA:  60 year old female with altered mental status and slurred speech. History of cirrhosis, tips.  EXAM:  MRI HEAD WITHOUT CONTRAST TECHNIQUE: Multiplanar, multiecho pulse sequences of the brain and surrounding structures were obtained without intravenous contrast. COMPARISON:  Head CT 01/08/2021. FINDINGS: Brain: Striking abnormal diffusion and T2/FLAIR signal symmetrically throughout the pons, with conspicuous sparing of the bilateral cortical spinal tracts (series 8, image 10, series 5, image 72). There is vague associated T1 hypointensity. The pons is mildly expanded. There is no associated hemorrhage. No other diffusion restriction. There is much less pronounced signal heterogeneity in the bilateral deep gray nuclei, primarily involving the internal capsules symmetrically. And there is mild T2 shine through and T2/FLAIR hyperintensity symmetrically affecting the posterior corona radiata/centrum semiovale also. No significant T1 signal abnormality in the deep gray nuclei. No superimposed midline shift, ventriculomegaly, extra-axial collection or acute intracranial hemorrhage. Cervicomedullary junction and pituitary are within normal limits. Minimal additional subcortical white matter T2 and FLAIR hyperintensity is nonspecific and mild for age. No cortical encephalomalacia or chronic cerebral blood products identified. Vascular: Major intracranial vascular flow voids are preserved. Skull and upper cervical spine: C4-C5 degenerative spinal stenosis related to disc herniation is visible on series 7, image 12. There is at least mild associated spinal cord mass effect. There is some generalized decreased T1 marrow signal throughout the skull and visible spine but no destructive osseous lesion is identified. Sinuses/Orbits: Postoperative changes to both globes, negative orbits otherwise. Left sphenoid sinus disease. Other: Trace mastoid fluid on the right. Visible internal auditory structures appear normal. Negative visible scalp and face soft tissues. IMPRESSION: 1. Striking abnormal diffusion, T2 and FLAIR signal  abnormality symmetrically affecting the pons with sparing of the bilateral cortical spinal tracts. Favor Osmotic Demyelination syndrome. No associated hemorrhage. No significant mass effect. 2. Relatively subtle signal changes also in the bilateral thalamus. But more pronounced in the bilateral internal capsules and cortical white matter radiations. These are nonspecific but favor also secondary to #1. 3. Generalized decreased T1 marrow signal is nonspecific but might be related to the history of cirrhosis. 4. There is C4-C5 degenerative spinal stenosis related to disc herniation with at least mild spinal cord mass effect. Electronically Signed: By: Genevie Ann M.D. On: 01/10/2021 10:12   MR BRAIN W CONTRAST  Result Date: 01/22/2021 CLINICAL DATA:  Follow-up examination for demyelinating disease. EXAM: MRI HEAD WITH CONTRAST TECHNIQUE: Multiplanar, multiecho pulse sequences of the brain and surrounding structures were obtained with intravenous contrast. CONTRAST:  6.2m GADAVIST GADOBUTROL 1 MMOL/ML IV SOLN COMPARISON:  Previous noncontrast brain MRI from 01/20/2021. FINDINGS: Brain: Stable cerebral volume. There is persistent prominent T2 signal abnormality involving the central pons, with relative sparing of the periphery/corticospinal tracts. Additional less pronounced signal abnormality involving the posterior limbs of the internal capsules, with hazy involvement of the deep white matter of the bilateral corona radiata/centrum semi ovale again noted as well. Overall, appearance is similar as compared to recent noncontrast brain MRI. No associated enhancement following contrast administration. No other new intracranial abnormality on this limited postcontrast examination. No other mass lesion, mass effect, or midline shift. Ventricles stable in size without hydrocephalus. No extra-axial fluid collection. Pituitary gland suprasellar region normal. Midline structures intact and normal. No other abnormal  enhancement. Vascular: Normal enhancement seen throughout the intracranial vasculature. Skull and upper cervical spine: Craniocervical junction within normal limits. Partially visualized upper cervical spine better evaluated on concomitant cervical spine MRI. Bone marrow signal intensity within normal limits. No scalp soft tissue abnormality. Sinuses/Orbits: Globes and orbital soft tissues demonstrate no acute finding. Paranasal sinuses remain largely clear. No significant mastoid  effusion. Other: None. IMPRESSION: 1. Persistent imaging findings suggestive of osmotic demyelination, relatively similar to previous exams. No associated enhancement. 2. No other new acute intracranial abnormality or other abnormal enhancement elsewhere within the brain. Electronically Signed   By: Jeannine Boga M.D.   On: 01/22/2021 23:01   MR CERVICAL SPINE WO CONTRAST  Result Date: 01/11/2021 CLINICAL DATA:  Demyelinating disease. EXAM: MRI CERVICAL SPINE WITHOUT CONTRAST TECHNIQUE: Multiplanar, multisequence MR imaging of the cervical spine was performed. No intravenous contrast was administered. COMPARISON:  None. FINDINGS: Alignment: Straightening of the cervical curvature. Vertebrae: No fracture, evidence of discitis, or bone lesion. Cord: Mass effect on the cord at the C4-5, C5-6 and C6-7 levels. No gross cord signal abnormality. Posterior Fossa, vertebral arteries, paraspinal tissues: Prominent T2 hyperintensity within the pons and in the bilateral middle cerebellar peduncles, with sparing of the bilateral cortical spinal tracts, better evaluated on recent MRI of the brain performed on January 10, 2021. Disc levels: C2-3: Small posterior disc protrusion and mild facet degenerative changes without significant spinal canal or neural foraminal stenosis. C3-4: Small posterior disc protrusion resulting in mild spinal canal stenosis. Uncovertebral and facet degenerative changes resulting in mild right neural foraminal  narrowing. C4-5: Posterior disc osteophyte complex resulting in moderate to severe spinal canal stenosis with mass effect cord. Uncovertebral and facet degenerative changes resulting in severe right neural foraminal narrowing. C5-6: Posterior disc osteophyte complex and prominence of the ligamentum flavum resulting in moderate spinal canal stenosis with mass effect on the cord. Uncovertebral facet degenerative changes noting joint effusion in the right facet joint. Findings result in severe right and moderate left neural foraminal narrowing. C6-7: Posterior disc osteophyte complex resulting in moderate spinal canal stenosis with mass effect the cord. Uncovertebral and facet degenerative changes resulting in mild bilateral neural foraminal narrowing. C7-T1: Posterior disc protrusion and prominence of the ligamentum flavum resulting in mild spinal canal stenosis. Facet degenerative changes without significant neural foraminal narrowing. IMPRESSION: 1. Degenerative changes of the cervical spine resulting in moderate to severe spinal canal stenosis at C4-5 and moderate at C5-6 and C6-7. There is mass effect on the cord at these levels, worse at C4-5 without cord signal abnormality. 2. Multilevel high-grade neural foraminal narrowing severe on the right at C4-5 and C5-6 and moderate on the left at C5-6. 3. Prominent T2 hyperintensity within the pons and less evident in the bilateral middle cerebellar peduncles suggestive of osmotic demyelination. This is best characterized on recent MRI of the brain performed January 10, 2021. Electronically Signed   By: Pedro Earls M.D.   On: 01/11/2021 14:55   MR CERVICAL SPINE W WO CONTRAST  Result Date: 01/22/2021 CLINICAL DATA:  Demyelinating disease. Increasing weakness. Neurological decline EXAM: MRI CERVICAL SPINE WITHOUT AND WITH CONTRAST TECHNIQUE: Multiplanar and multiecho pulse sequences of the cervical spine, to include the craniocervical junction and  cervicothoracic junction, were obtained without and with intravenous contrast. CONTRAST:  6.81m GADAVIST GADOBUTROL 1 MMOL/ML IV SOLN COMPARISON:  MRI cervical spine 01/11/2021 FINDINGS: Alignment: Straightening of the cervical lordosis without static listhesis. Vertebrae: No fracture, evidence of discitis, or suspicious bone lesion. Incidental note of a small intraosseous hemangioma within the C7 vertebral body. Cord: Subtle areas of T2 hyperintense signal seen within the cervical cord at the C4-5 and C5 levels (series 2, images 8 and 9) seen only on sagittal T2 weighted sequences. It is unclear if these findings are artifactual. No definitive cord signal abnormality on axial T2 sequences or sagittal STIR  sequence. No abnormal enhancement on postcontrast sequences. Posterior Fossa, vertebral arteries, paraspinal tissues: Please see concurrently obtained dedicated MRI of the brain for evaluation of the posterior fossa and brainstem. Vertebral artery flow voids are intact. No paraspinal abnormality. Disc levels: Multilevel cervical spondylosis is unchanged compared to recent prior cervical spine MRI dated 01/11/2021. Please refer to that report for level by level detail. Most severely involved level is C4-5 where a disc osteophyte complex contributes to moderate to severe canal stenosis. There is also moderate canal stenosis at the C5-6 and C6-7 levels. Bilateral foraminal stenosis is also present at multiple levels. IMPRESSION: 1. Subtle areas of T2 hyperintense signal within the cervical cord at the C4-5 and C5 levels seen only on sagittal T2 weighted sequences. It is unclear if these findings are artifactual. No definitive cord signal abnormality on the axial T2 or sagittal STIR sequence. A developing demyelinating lesion would be difficult to exclude. No abnormal enhancement on postcontrast sequences. 2. Unchanged cervical spondylosis compared to recent prior cervical spine MRI dated 01/11/2021. Moderate to  severe canal stenosis at C4-5 and moderate canal stenosis at C5-6 and C6-7. Electronically Signed   By: Davina Poke D.O.   On: 01/22/2021 20:50   US Abdomen Limited  Result Date: 01/10/2021 CLINICAL DATA:  Ascites EXAM: LIMITED ABDOMEN ULTRASOUND FOR ASCITES TECHNIQUE: Limited ultrasound survey for ascites was performed in all four abdominal quadrants. COMPARISON:  09/18/2020 FINDINGS: Trace fluid in the right upper quadrant. No ascites is visualized in the left upper quadrant or bilateral lower pelvis. IMPRESSION: Trace upper abdominal ascites. This would be insufficient for paracentesis. Electronically Signed   By: Julian Hy M.D.   On: 01/10/2021 01:46    Labs:  Basic Metabolic Panel: Recent Labs  Lab 02/02/21 0420 02/04/21 0510  NA 138 139  K 4.2 4.7  CL 109 109  CO2 24 24  GLUCOSE 52* 204*  BUN 10 14  CREATININE 0.63 0.94  CALCIUM 8.1* 8.9  MG 1.8 1.6*    CBC: Recent Labs  Lab 02/04/21 0510  WBC 4.9  NEUTROABS 3.5  HGB 9.8*  HCT 27.4*  MCV 98.2  PLT 134*    CBG: Recent Labs  Lab 02/07/21 0555 02/07/21 0621 02/07/21 1122 02/07/21 1700 02/07/21 2136  GLUCAP 56* 89 133* 127* 122*   Family history.  Mother with hypertension as well as COPD.  Follows myocardial infarction.  Brother with diabetes.  Denies any colon cancer esophageal cancer or rectal cancer  Brief HPI:   Meagan Peck is a 60 y.o. right-handed female with history significant for [name] cirrhosis with previous hepatic encephalopathy maintained on chronic Cipro, diabetes mellitus, hypertension hyperlipidemia esophageal varices with GERD.  Per chart review lives with spouse and family.  Needed some assist with ADLs, she mostly sponge bathes.  Presented 01/08/2021 altered mental status and slurred speech.  Denied any chills or fever.  Admission chemistries unremarkable except glucose 348 AST 54 ammonia level 83 hemoglobin A1c 10.7 hemoglobin 9.2 urinalysis negative nitrite.  Cranial CT scan negative  for acute changes.  MRI showed striking abnormal diffusion, T2 and flair signal abnormality symmetrically affecting the pons with sparing of the bilateral cortical infarcts favoring osmotic demyelination syndrome.  Relatively subtle signal changes also in the bilateral thalamus felt to be nonspecific.  Ultrasound of the abdomen showed trace upper abdominal ascites and no plan for paracentesis.  MRI cervical spine degenerative changes moderate to severe spinal stenosis C4-5 and moderate C5-6 and C6-7.  There was some mass-effect  on the cord at these levels worse at C4-5 without cord signal abnormality.  Latest ammonia level 139 continues on scheduled Chronulac.  Therapy evaluations completed due to patient decreased functional mobility was admitted for a comprehensive rehab program.   Hospital Course: Meagan Peck was admitted to rehab 01/12/2021 for inpatient therapies to consist of PT, ST and OT at least three hours five days a week. Past admission physiatrist, therapy team and rehab RN have worked together to provide customized collaborative inpatient rehab.  Pertain to patient's debility secondary to osmotic demyelination syndrome resulting in altered mental status secondary to hepatic encephalopathy/NASH cirrhosis and quadriparesis.  She remained on Chronulac adjusted accordingly as well as needed addition of rifaximin.  Close monitoring of ammonia levels.  During her rehabilitation hospital course follow-up neurology services in regards to osmotic demyelination latest MRI showed persistent imaging suggesting osmotic demyelination relatively similar to previous exams no associated enhancement.  No other acute findings.  She remained on chronic Cipro as recommendations made per neurology services for osmotic demyelination.  MRI cervical spine subtle areas of T2 hyperintense signal within the cervical cord at C4-5 and C5 level seen only on signal T2 weighted sequences.  It was unclear if these findings were  artifactual.  No definitive cord signal abnormality on the axial T2 or sagittal STIR sequence.  Patient continue with therapies with very slow modest gains.  Ongoing education provided with family in regards to patient's slow progress recommendations were made for hospice care on discharge with arrangements being made.  Blood pressures monitored remained soft.  Blood sugars with variables insulin therapy as directed with latest hemoglobin A1c 10.7.  Mood stabilization with Celexa as well as follow-up by neuropsychology.  Her course was complicated by ileus her diet slowly advanced she had been maintained on IV fluids for the necessary hydration.  Appetite remains poor maintained on Marinol for appetite stimulant.   Blood pressures were monitored on TID basis and soft and monitored  Diabetes has been monitored with ac/hs CBG checks and SSI was use prn for tighter BS control.    Rehab course: During patient's stay in rehab weekly team conferences were held to monitor patient's progress, set goals and discuss barriers to discharge. At admission, patient required moderate assist 5 feet rolling walker minimal assist stand pivot transfers  Physical exam.  Blood pressure 142/54 pulse 76 temperature 98.9 respirations 18 oxygen saturation 96% room air Constitutional.  Frail-appearing female Head.  Normocephalic and atraumatic Eyes.  Pupils round and reactive to light no discharge without nystagmus Neck.  Supple nontender no JVD without thyromegaly Cardiac regular rate and rhythm any extra sounds or murmur heard Abdomen.  Soft.  Umbilical hernia.  Nontender Respiratory effort normal no respiratory distress without wheeze Neurologic.  Disconjugate gaze.  Follows commands.  Speech was distant delayed.  Right upper extremity 0/5 proximal distal right lower extremity 0/5 proximal distal  He/She  has had improvement in activity tolerance, balance, postural control as well as ability to compensate for deficits.   Focused on bed mobility and family education.  Patient was able to roll left to right with moderate assist with knees flexed.  Assisted at hips and shoulders to complete rolling to side-lying.  Patient was able to roll back into supine without assistance.  She was able perform ankle pumps independently.  She needed total assist for ADLs.  Full family teaching ongoing long discussions held with family need for hospice care at discharge arrangements made per social worker.  Disposition: Discharged home    Diet: Carb modified  Special Instructions: No smoking or alcohol  Hospice referral obtained  Medications at discharge 1.  Vitamin D 1000 units nightly 2.  Cipro 500 mg daily 3.  Celexa 20 mg p.o. daily 4.  Ferrous sulfate 220 mg p.o. twice daily 5.  Semglee 5 units twice daily 6.  Chronulac 60 mg p.o. 4 times daily 7.  Claritin 10 mg p.o. daily 8.  Magnesium oxide 800 mg p.o. twice daily 9.  Multivitamin daily 10.  Protonix 40 mg p.o. daily 11.  Rifaximin 550 mg p.o. twice daily  30-35 minutes spent completing discharge summary and discharge planning  Discharge Instructions     Ambulatory referral to Neurology   Complete by: As directed    An appointment is requested in approximately: 4 weeks hepatic encephalopathy/osmotic demyelination syndrome        Follow-up Information     Lovorn, Jinny Blossom, MD Follow up.   Specialty: Physical Medicine and Rehabilitation Why: Office to call for appointment Contact information: [address] [place] [address] [phone]                 Signed: Lavon Paganini Ethel 02/08/2021, 5:15 AM

## 2021-02-06 NOTE — Progress Notes (Signed)
Hypoglycemic Event  CBG: 64  Treatment: 8 oz juice/soda  Symptoms: None  Follow-up CBG: Time:1715 CBG Result:97   Possible Reasons for Event: Medication regimen: meal coverage insulin  Comments/MD notified:no     Sheela Stack

## 2021-02-06 NOTE — Progress Notes (Signed)
Speech Language Pathology Discharge Summary  Patient Details  Name: Meagan Peck MRN: [id number] Date of Birth: 07-03-1960  Today's Date: 02/08/2021 SLP Individual Time: 1210-1235 SLP Individual Time Calculation (min): 25 min   Skilled Therapeutic Interventions:  Skilled treatment session focused on speech and dysphagia goals. Upon arrival, patient was awake in the wheelchair. SLP facilitated session by providing patient with her lunch tray. Patient was ~90% intelligible at the phrase level while expressing wants/needs regarding preferences for lunch.  Patient requested peaches and pudding. SLP provided total A for feeding. Patient with mildly decreased mastication but no overt s/s of aspiration were noted with solids or liquids via straw. Recommend patient continue current diet. Patient left semi-reclined in wheelchair with all needs within reach.   Patient has met 2 of 2 long term goals.  Patient to discharge at overall Mod;Max level.   Reasons goals not met: N/A   Clinical Impression/Discharge Summary: Patient met 2 of 2 LTG's though they were downgraded significantly from when set at initial evaluation and patient currently at a mod-maxA level for communication/speech and mod-maxA for swallow function goals. Her cognition continues to be [facility] but unfortunately she has had a significant decline in her overall medical condition. She is able to communicate verbally at one-word level however she requires non-verbal means to communicate when she is too weak to speak intelligibly. Communication boards/alphabet boards have been utilized with patient but do require communciation partner to assist patient. SLP is not recommending any further skilled intervention at next venue of care as plan is for patient to discharge home with family and hospice care.   Care Partner:  Caregiver Able to Provide Assistance: Yes  Type of Caregiver Assistance: Physical  Recommendation:  None      Equipment: N/A  for ST   Reasons for discharge: Discharged from hospital   Patient/Family Agrees with Progress Made and Goals Achieved: Yes    [place], Riva 02/08/2021, 12:39 PM

## 2021-02-06 NOTE — Progress Notes (Signed)
[place] PHYSICAL MEDICINE & REHABILITATION PROGRESS NOTE  Subjective/Complaints:   No new issues overnight. Resting comfortably when I stopped by  ROS: Limited due to cognitive/behavioral    Objective: Vital Signs: Blood pressure (!) 111/48, pulse 73, temperature 99.5 F (37.5 C), temperature source Oral, resp. rate 17, height 4' 10"  (1.473 m), weight 59.7 kg, last menstrual period 12/03/2013, SpO2 98 %. No results found. Recent Labs    02/04/21 0510  WBC 4.9  HGB 9.8*  HCT 27.4*  PLT 134*     Recent Labs    02/04/21 0510  NA 139  K 4.7  CL 109  CO2 24  GLUCOSE 204*  BUN 14  CREATININE 0.94  CALCIUM 8.9      Intake/Output Summary (Last 24 hours) at 02/06/2021 1357 Last data filed at 02/06/2021 1214 Gross per 24 hour  Intake 454 ml  Output --  Net 454 ml        Physical Exam: BP (!) 111/48 (BP Location: Left Arm)   Pulse 73   Temp 99.5 F (37.5 C) (Oral)   Resp 17   Ht 4' 10"  (1.473 m)   Wt 59.7 kg   LMP 12/03/2013 (LMP Unknown)   SpO2 98%   BMI 27.51 kg/m            Constitutional: No distress . Vital signs reviewed. HEENT: NCAT, EOMI, oral membranes moist Neck: supple Cardiovascular: RRR without murmur. No JVD    Respiratory/Chest: CTA Bilaterally without wheezes or rales. Normal effort    GI/Abdomen: BS +, non-tender, non-distended Ext: no clubbing, cyanosis, or edema Psych: flat cooperative  Neurological:   Alert- dysarthric much better today- could understand on first time; not 3-4th time; however RUE- 2+/5 and RLE 3/5 in DF and PF- which is [facility] better Not real functional so far.  Ext: no clubbing, cyanosis, or edema     Skin: No evidence of breakdown, no evidence of rash  Neuro: Alert, dysconjugate gaze. Follows basic commands. Dysarthric, delayed. Motor: RUE: 0/5 proximal distal,  RLE: 0/5 proximal distal- no change today on R side ~ 3+/5 on L side Sensation diminished to light touch right hand, stable MSK- pain right  shoulder with IR. Heel cords tight   Assessment/Plan: 1. Functional deficits which require 3+ hours per day of interdisciplinary therapy in a comprehensive inpatient rehab setting. Physiatrist is providing close team supervision and 24 hour management of active medical problems listed below. Physiatrist and rehab team continue to assess barriers to discharge/monitor patient progress toward functional and medical goals   Care Tool:  Bathing    Body parts bathed by patient: Chest, Abdomen   Body parts bathed by helper: Right arm, Left arm, Front perineal area, Buttocks, Right upper leg, Left upper leg, Right lower leg, Left lower leg, Face     Bathing assist Assist Level: Total Assistance - Patient < 25%     Upper Body Dressing/Undressing Upper body dressing   What is the patient wearing?: Pull over shirt    Upper body assist Assist Level: Total Assistance - Patient < 25%    Lower Body Dressing/Undressing Lower body dressing      What is the patient wearing?: Incontinence brief     Lower body assist Assist for lower body dressing: Dependent - Patient 0%     Toileting Toileting    Toileting assist Assist for toileting: Dependent - Patient 0%     Transfers Chair/bed transfer  Transfers assist     Chair/bed transfer assist  level: 2 Helpers     Locomotion Ambulation   Ambulation assist   Ambulation activity did not occur: Safety/medical concerns  Assist level: Minimal Assistance - Patient > 75% Assistive device: Walker-rolling Max distance: 50'   Walk 10 feet activity   Assist  Walk 10 feet activity did not occur: Safety/medical concerns  Assist level: Minimal Assistance - Patient > 75% Assistive device: Walker-rolling   Walk 50 feet activity   Assist Walk 50 feet with 2 turns activity did not occur: Safety/medical concerns  Assist level: Minimal Assistance - Patient > 75% Assistive device: Walker-rolling    Walk 150 feet  activity   Assist Walk 150 feet activity did not occur: Safety/medical concerns         Walk 10 feet on uneven surface  activity   Assist Walk 10 feet on uneven surfaces activity did not occur: Safety/medical concerns         Wheelchair     Assist Is the patient using a wheelchair?: Yes Type of Wheelchair: Manual Wheelchair activity did not occur: Safety/medical concerns  Wheelchair assist level: Dependent - Patient 0%      Wheelchair 50 feet with 2 turns activity    Assist    Wheelchair 50 feet with 2 turns activity did not occur: Safety/medical concerns   Assist Level: Dependent - Patient 0%   Wheelchair 150 feet activity     Assist  Wheelchair 150 feet activity did not occur: Safety/medical concerns   Assist Level: Dependent - Patient 0%    Medical Problem List and Plan: 1.  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  Appreciate neurology follow up. No new neuro intereventions recommended 10/4- will check with team if strength translates to functional changes- con't PT and OT- currently 1x/day.  10/5- changed d/c date to 10/10- will need ambulance to get home- con't PT and OT- CIR- doesn't have enough strength to translate into anything functional.  10/8 completed family training -home by ambulance 10/10 with hospice-  2.  Antithrombotics: -DVT/anticoagulation: SCDs 9/28- Plts 127- con't  to monitor  Pharmaceutical: Other (comment)             -antiplatelet therapy: N/A 3. Pain Management: Tylenol d/ced given hepatic failure neurogenic pain over chest and abd bilateral , has some signal abnormality in thalamus bilaterally   Baclofen 5 BID started on 9/20, changed  to nightly on 9/21 due to lethargy, d/ced on 9/23 d/t lethargy 4. Mood: Provide emotional support             -antipsychotic agents: N/A 5. Neuropsych: This patient is not  Fully capable of making decisions on her own behalf. Some emotional lability as expected given extensive brain stem lesion   9/27- will call palliative care to discuss pt's care. 9/28- consulted palliative care- spoke to them- wants ot determine goals of care.  9/29- they attempted to see, but couldn't speak with pt and couldn't reach husband. Willc on't to try  9/30- pallative care saw pt- made DNR- she wants ot go home- had pt see Neuropsych today as well.   10/4- will send home on Hospice.   10/5- not eating- concerned and let pt know this will cause problems with life expectancy. Will start Celexa 20 mg daily for depression 6. Skin/Wound Care:  Routine skin checks 7. Fluids/Electrolytes/Nutrition:   I personally reviewed the patient's labs today.   8.  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  Neurology follow-up conservative care  9/29- spoke with Neuro- they think it's made worse by high ammonia- Ammonia 113 today- will try to get her to go/have multiple BM's- complicated by ileus.   9/30- Ammonia 118- working on increasing lactulose. 10/2- Ammonia down to 38- MUCH better- and she looks better/less dysarthric; no change in strength that I can see, though- 10/3- strength much better in RUE/RLE this AM- will con't lactulose and Rafaxamin.   9.  Uncontrolled diabetes mellitus with hyperglycemia.  Hemoglobin A1c 10.7.   NovoLog 12 units 3 times daily, increase to 14 units on 9/16 Semglee 20 units twice daily.  Diabetic teaching- couldn't afford insulin at home.  CBG (last 3)  Recent Labs    02/05/21 1635 02/06/21 0619 02/06/21 1122  GLUCAP 146* 84 94  9/26 increase semglee to 22u bid  9/30- BG's controlled- con't regimen  10/2- Bgs 50s-60s this AM- will decrease Semglee to 20 units BID  10/3- will reduce  Semglee to 18 units BID.   10/4- not eating as much- decrease mglee to 15 units BID and Novolog to 12 units with meals.   10/5- reducing Novolog to 6 units and Semglee to 8 units BID since NOT eating basically at all. 10/6- BG's better- 135-182- also eating some today- con't to monitor  10/7- will increase Semglee- to 10 units BID- told daughter will probably need to titrate once gets home and hopefully eats more.  10/8 no changes to regimen today. Eating fairly well right now 10.  Acute on chronic anemia.  Continue iron supplement.    Hemoglobin 11.1 on 9/19--> 10.6 9/26  F/u Monday 11. Constipation in setting of cirrhosis: Cont chronulac 75m 5x daily  9/27- LBM yesterday small x1- will give sorbitol x1 today and con't lactulose- also on Rifaxamin.  9/30- ileus resolving- will increase Lactulose 10/7- pt refusing lactulose, but had 3 Bms yesterday- asked pt/daughter to make sure she goes 3-4x/day.  12. Hiccups-?  Resolved 13. Thrombocytopenia  Platelets 106 on 9/19--> 127k 9/26  Continue to monitor 14.  Cervical stenosis  MRI showing multiple abnormalities  Appreciate neurosurgery recs, no plans for intervention at present 15.  NASH Cirrhosis    mild elevation of AST--> sl trend up 9/26 however--f/u later this week  10/2- labs tomorrow  10/3- Labs up more today 154 and 99- will recheck Thursday and if going up, will call GI.   10/6- ALT/AST 75/63- doing better- con't regimen 16.  Hypoalbuminemia  Supplement initiated  17.  Hyperammonemia  Ammonia 121 on 9/22---> up to 145 9/26  -already receiving lactulose 353m5 x daily  -will ask GI for further recs to treat  9/28- will check KUB as to why cannot get her to poop- and get cleaned out.   9/30- Ammonia 118- will increase lactulose  10/2- Ammonia down to 38- no change in strength-   10/3- strength better today!  10/4- strength stable- will recheck Ammonia Thursday-   10/6- strength stable- still pooping, but refusing lactulose.   18.  Bilateral ankle heel cord tightness add PRAFO   9/27- con't PRAFOs- pt appears to be getting weaker? Will d/w therapy.  19. Ileus  9/29- will change to clear liquid diet and IVFs 75cc/hour x 1 days and give Sorbitol and enema to try and get her moving- making high ammonia worse  10/2- will  stop IVFs and change back to D1 thins diet.   resolved 20. Hypomagnesemia  9/29- will give MgOx 800 mg BID and recheck in AM- might need IV Mg.   9/30- will give IV Mg and recheck levels Sunday. Can give IV 4G per d/w pharmacy  10/2- will recechk in AM- Mg 1.5  10/3- Mg 1.4- will repeat IV MG. Labs in AM  10/4- Mg up to 1.8!- con't PO meds  10/7- Mg 1.6- is chronically low- wil con't repletion.  11. Hypokalemia  10/3- will replete KCL 40 mEq x2 and recheck in AM  10/4- K+ up to 4.2- con't to monitor 12. Raw buttocks  10/4- added gerhardt's and tucks.  13. Poor appetite  10/5- Will try Marinol 2.5 mg BID for appetite.    LOS: 25 days A FACE TO FACE EVALUATION WAS PERFORMED  Meredith Staggers 02/06/2021, 1:57 PM

## 2021-02-07 LAB — GLUCOSE, CAPILLARY
Glucose-Capillary: 56 mg/dL — ABNORMAL LOW (ref 70–99)
Glucose-Capillary: 89 mg/dL (ref 70–99)
Glucose-Capillary: 133 mg/dL — ABNORMAL HIGH (ref 70–99)
Glucose-Capillary: 127 mg/dL — ABNORMAL HIGH (ref 70–99)
Glucose-Capillary: 122 mg/dL — ABNORMAL HIGH (ref 70–99)

## 2021-02-07 MED ORDER — INSULIN GLARGINE-YFGN 100 UNIT/ML ~~LOC~~ SOLN
5.0000 [IU] | Freq: Two times a day (BID) | SUBCUTANEOUS | Status: DC
  Administered 2021-02-07 – 2021-02-08 (×2): 5 [IU] via SUBCUTANEOUS
  Filled 2021-02-07 (×5): qty 0.05

## 2021-02-07 MED ORDER — INSULIN ASPART 100 UNIT/ML IJ SOLN
3.0000 [IU] | Freq: Three times a day (TID) | INTRAMUSCULAR | Status: DC
  Administered 2021-02-07 (×2): 3 [IU] via SUBCUTANEOUS

## 2021-02-07 NOTE — Progress Notes (Addendum)
Speech Language Pathology Daily Session Note  Patient Details  Name: Meagan Peck MRN: [id number] Date of Birth: 07/06/1960  Today's Date: 02/07/2021 SLP Individual Time: 0815-0900 SLP Individual Time Calculation (min): 45 min  Short Term Goals: Week 3: SLP Short Term Goal 1 (Week 3): Patient will verbalize basic wants/needs and responses to questions at word level with maxA verbal cues for improving speech intelligibility. SLP Short Term Goal 2 (Week 3): Patient will tolerate trials of soft solids with SLP with minA cues for safety strategies. SLP Short Term Goal 3 (Week 3): SLP will determine if any benefit from basic level non-verbal communmication (communication board, etc) with modA.  Skilled Therapeutic Interventions:   Pt was seen at bedside with breakfast for skilled ST session focused on facilitating verbal expression. Pt was awake and alert.She was able to verbalize needs and wants by providing 1-2 words with prompting from SLP.  Pt was dependent for self-feeding, eating 100% of provided meal. Anterior labial spillage  and oral residue was noted throughout meal. Pt attempted lingual sweep of oral cavity to clear residue. Liquid rinse was most successful. Pt was left in room with call bell under L hand. Bed alarm in place. *met LTGs   Pain Pain Assessment Pain Scale: 0-10 Pain Score: 0-No pain  Therapy/Group: Individual Therapy  Verdene Lennert MS, CCC-SLP, CBIS  02/07/2021, 9:03 AM

## 2021-02-07 NOTE — Progress Notes (Signed)
Inpatient Rehabilitation Medication Review by a Pharmacist  A complete drug regimen review was completed for this patient to identify any potential clinically significant medication issues.  High Risk Drug Classes Is patient taking? Indication by Medication  Antipsychotic No   Anticoagulant No   Antibiotic Yes Chronic Cipro / rifaximin for NASH cirrhosis / SBP prophylaxis   Opioid No   Antiplatelet No   Hypoglycemics/insulin Yes SSI / Semglee for DM  Vasoactive Medication No   Chemotherapy No   Other No      Type of Medication Issue Identified Description of Issue Recommendation(s)  Drug Interaction(s) (clinically significant)     Duplicate Therapy     Allergy     No Medication Administration End Date     Incorrect Dose     Additional Drug Therapy Needed     Significant med changes from prior encounter (inform family/care partners about these prior to discharge).    Other       Clinically significant medication issues were identified that warrant physician communication and completion of prescribed/recommended actions by midnight of the next day:  No  Pharmacist comments: No issues identified  Time spent performing this drug regimen review (minutes):  20 minutes   Tad Moore 02/07/2021 11:09 AM

## 2021-02-07 NOTE — Progress Notes (Signed)
Hypoglycemic Event  CBG: 56 at 0555  Treatment: 4 oz juice/soda and 1 carton of milk  Symptoms: None  Follow-up CBG: Time:0621 CBG Result:89  Possible Reasons for Event: Inadequate meal intake  Comments/MD notified: Pt refused HS snack.  She stated she didn't like the Ensure drinks.  She also refused any other offer of snack.  She did have 1 cup of applesauce with HS meds.    Saunders Glance BSN RN CMSRN

## 2021-02-07 NOTE — Progress Notes (Signed)
[place] PHYSICAL MEDICINE & REHABILITATION PROGRESS NOTE  Subjective/Complaints:   Slept last night. Fair but inconsistent po intake. Has been hypoglycemic however.   ROS: Limited due to cognitive/behavioral    Objective: Vital Signs: Blood pressure (!) 123/57, pulse 74, temperature 98.3 F (36.8 C), temperature source Oral, resp. rate 16, height 4' 10"  (1.473 m), weight 59.7 kg, last menstrual period 12/03/2013, SpO2 100 %. No results found. No results for input(s): WBC, HGB, HCT, PLT in the last 72 hours.    No results for input(s): NA, K, CL, CO2, GLUCOSE, BUN, CREATININE, CALCIUM in the last 72 hours.     Intake/Output Summary (Last 24 hours) at 02/07/2021 0942 Last data filed at 02/07/2021 0845 Gross per 24 hour  Intake 775 ml  Output --  Net 775 ml        Physical Exam: BP (!) 123/57   Pulse 74   Temp 98.3 F (36.8 C) (Oral)   Resp 16   Ht 4' 10"  (1.473 m)   Wt 59.7 kg   LMP 12/03/2013 (LMP Unknown)   SpO2 100%   BMI 27.51 kg/m   Constitutional: No distress . Vital signs reviewed. HEENT: NCAT, EOMI, oral membranes moist Neck: supple Cardiovascular: RRR without murmur. No JVD    Respiratory/Chest: CTA Bilaterally without wheezes or rales. Normal effort    GI/Abdomen: BS +, non-tender, non-distended Ext: no clubbing, cyanosis, or edema Psych: flat  Neurological:   Alert- dysarthric much better today- could understand on first time; not 3-4th time; however RUE- 2+/5 and RLE 3/5 in DF and PF- which is [facility] better Not real functional so far.  Ext: no clubbing, cyanosis, or edema     Skin: No evidence of breakdown, no evidence of rash  Neuro: Alert, dysconjugate gaze. Follows basic commands. Dysarthric, delayed. Motor: RUE: 0/5 proximal distal,  RLE: 0/5 proximal distal- no change today on R side ~ 3+/5 on L side Sensation diminished to light touch right hand, stable MSK- pain right shoulder with IR. Heel cords tight  Assessment/Plan: 1.  Functional deficits which require 3+ hours per day of interdisciplinary therapy in a comprehensive inpatient rehab setting. Physiatrist is providing close team supervision and 24 hour management of active medical problems listed below. Physiatrist and rehab team continue to assess barriers to discharge/monitor patient progress toward functional and medical goals   Care Tool:  Bathing    Body parts bathed by patient: Chest, Abdomen   Body parts bathed by helper: Right arm, Left arm, Front perineal area, Buttocks, Right upper leg, Left upper leg, Right lower leg, Left lower leg, Face     Bathing assist Assist Level: Total Assistance - Patient < 25%     Upper Body Dressing/Undressing Upper body dressing   What is the patient wearing?: Pull over shirt    Upper body assist Assist Level: Total Assistance - Patient < 25%    Lower Body Dressing/Undressing Lower body dressing      What is the patient wearing?: Incontinence brief     Lower body assist Assist for lower body dressing: Dependent - Patient 0%     Toileting Toileting    Toileting assist Assist for toileting: Dependent - Patient 0%     Transfers Chair/bed transfer  Transfers assist     Chair/bed transfer assist level: 2 Helpers     Locomotion Ambulation   Ambulation assist   Ambulation activity did not occur: Safety/medical concerns  Assist level: Minimal Assistance - Patient > 75% Assistive device: Walker-rolling  Max distance: 56'   Walk 10 feet activity   Assist  Walk 10 feet activity did not occur: Safety/medical concerns  Assist level: Minimal Assistance - Patient > 75% Assistive device: Walker-rolling   Walk 50 feet activity   Assist Walk 50 feet with 2 turns activity did not occur: Safety/medical concerns  Assist level: Minimal Assistance - Patient > 75% Assistive device: Walker-rolling    Walk 150 feet activity   Assist Walk 150 feet activity did not occur: Safety/medical  concerns         Walk 10 feet on uneven surface  activity   Assist Walk 10 feet on uneven surfaces activity did not occur: Safety/medical concerns         Wheelchair     Assist Is the patient using a wheelchair?: Yes Type of Wheelchair: Manual Wheelchair activity did not occur: Safety/medical concerns  Wheelchair assist level: Dependent - Patient 0%      Wheelchair 50 feet with 2 turns activity    Assist    Wheelchair 50 feet with 2 turns activity did not occur: Safety/medical concerns   Assist Level: Dependent - Patient 0%   Wheelchair 150 feet activity     Assist  Wheelchair 150 feet activity did not occur: Safety/medical concerns   Assist Level: Dependent - Patient 0%    Medical Problem List and Plan: 1.  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  Appreciate neurology follow up. No new neuro intereventions recommended 10/4- will check with team if strength translates to functional changes- con't PT and OT- currently 1x/day.  10/5- changed d/c date to 10/10- will need ambulance to get home- con't PT and OT- CIR- doesn't have enough strength to translate into anything functional.  10/8-9 completed family training -home by ambulance 10/10 with hospice-  2.  Antithrombotics: -DVT/anticoagulation: SCDs 9/28- Plts 127- con't  to monitor  Pharmaceutical: Other (comment)             -antiplatelet therapy: N/A 3. Pain Management: Tylenol d/ced given hepatic failure neurogenic pain over chest and abd bilateral , has some signal abnormality in thalamus bilaterally   Baclofen 5 BID started on 9/20, changed to nightly on 9/21 due to lethargy, d/ced on 9/23 d/t lethargy 4. Mood:  Provide emotional support             -antipsychotic agents: N/A 5. Neuropsych: This patient is not  Fully capable of making decisions on her own behalf. Some emotional lability as expected given extensive brain stem lesion   9/27- will call palliative care to discuss pt's care. 9/28- consulted palliative care- spoke to them- wants ot determine goals of care.  9/29- they attempted to see, but couldn't speak with pt and couldn't reach husband. Willc on't to try  9/30- pallative care saw pt- made DNR- she wants ot go home- had pt see Neuropsych today as well.   10/4- will send home on Hospice.   10/5- not eating- concerned and let pt know this will cause problems with life expectancy. Will start Celexa 20 mg daily for depression 6. Skin/Wound Care: Routine skin checks 7. Fluids/Electrolytes/Nutrition:   I personally reviewed the patient's labs today.   8.  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  Neurology follow-up conservative care  9/29- spoke with Neuro-  they think it's made worse by high ammonia- Ammonia 113 today- will try to get her to go/have multiple BM's- complicated by ileus.   9/30- Ammonia 118- working on increasing lactulose. 10/2- Ammonia down to 38- MUCH better- and she looks better/less dysarthric; no change in strength that I can see, though- 10/3- strength much better in RUE/RLE this AM- will con't lactulose and Rafaxamin.   9.  Uncontrolled diabetes mellitus with hyperglycemia.  Hemoglobin A1c 10.7.   NovoLog 12 units 3 times daily, increase to 14 units on 9/16 Semglee 20 units twice daily.  Diabetic teaching- couldn't afford insulin at home.  CBG (last 3)  Recent Labs    02/06/21 2012 02/07/21 0555 02/07/21 0621  GLUCAP 153* 56* 89  9/26 increase semglee to 22u bid  9/30- BG's controlled- con't regimen  10/2- Bgs 50s-60s this AM- will decrease Semglee to 20 units BID  10/3- will reduce Semglee to 18 units BID.   10/4- not eating as much- decrease mglee to  15 units BID and Novolog to 12 units with meals.   10/5- reducing Novolog to 6 units and Semglee to 8 units BID since NOT eating basically at all. 10/6- BG's better- 135-182- also eating some today- con't to monitor  10/7- will increase Semglee- to 10 units BID- told daughter will probably need to titrate once gets home and hopefully eats more.  10/9 intake better but inconsistent. Sugars quite low  -reduce mealtime novolog to 3u and semglee to 5u bid 10.  Acute on chronic anemia.  Continue iron supplement.    Hemoglobin 11.1 on 9/19--> 10.6 9/26  F/u Monday 11. Constipation in setting of cirrhosis: Cont chronulac 84m 5x daily  9/27- LBM yesterday small x1- will give sorbitol x1 today and con't lactulose- also on Rifaxamin.  9/30- ileus resolving- will increase Lactulose 10/7- pt refusing lactulose, but had 3 Bms yesterday- asked pt/daughter to make sure she goes 3-4x/day.  12. Hiccups-?  Resolved 13. Thrombocytopenia  Platelets 106 on 9/19--> 127k 9/26  Continue to monitor 14.  Cervical stenosis  MRI showing multiple abnormalities  Appreciate neurosurgery recs, no plans for intervention at present 15.  NASH Cirrhosis    mild elevation of AST--> sl trend up 9/26 however--f/u later this week  10/2- labs tomorrow  10/3- Labs up more today 154 and 99- will recheck Thursday and if going up, will call GI.   10/6- ALT/AST 75/63- doing better- con't regimen 16.  Hypoalbuminemia  Supplement initiated  17.  Hyperammonemia  Ammonia 121 on 9/22---> up to 145 9/26  -already receiving lactulose 376m5 x daily  -will ask GI for further recs to treat  9/28- will check KUB as to why cannot get her to poop- and get cleaned out.   9/30- Ammonia 118- will increase lactulose  10/2- Ammonia down to 38- no change in strength-   10/3- strength better today!  10/4- strength stable- will recheck Ammonia Thursday-   10/6- strength stable- still pooping, but refusing lactulose.  18.  Bilateral ankle heel  cord tightness add PRAFO   9/27- con't PRAFOs- pt appears to be getting weaker? Will d/w therapy.  19. Ileus  9/29- will change to clear liquid diet and IVFs 75cc/hour x 1 days and give Sorbitol and enema to try and get her moving- making high ammonia worse  10/2- will stop IVFs and change back to D1 thins diet.   resolved 20. Hypomagnesemia  9/29- will give MgOx 800 mg BID and recheck in AM-  might need IV Mg.   9/30- will give IV Mg and recheck levels Sunday. Can give IV 4G per d/w pharmacy  10/2- will recechk in AM- Mg 1.5  10/3- Mg 1.4- will repeat IV MG. Labs in AM  10/4- Mg up to 1.8!- con't PO meds  10/7- Mg 1.6- is chronically low- wil con't repletion.  11. Hypokalemia  10/3- will replete KCL 40 mEq x2 and recheck in AM  10/4- K+ up to 4.2- con't to monitor 12. Raw buttocks  10/4- added gerhardt's and tucks.  13. Poor appetite  10/5-   Marinol 2.5 mg BID for appetite.    LOS: 26 days A FACE TO [place] 02/07/2021, 9:42 AM

## 2021-02-08 ENCOUNTER — Other Ambulatory Visit (HOSPITAL_COMMUNITY): Payer: Self-pay

## 2021-02-08 LAB — GLUCOSE, CAPILLARY
Glucose-Capillary: 69 mg/dL — ABNORMAL LOW (ref 70–99)
Glucose-Capillary: 77 mg/dL (ref 70–99)
Glucose-Capillary: 106 mg/dL — ABNORMAL HIGH (ref 70–99)

## 2021-02-08 MED ORDER — INSULIN GLARGINE-YFGN 100 UNIT/ML ~~LOC~~ SOPN
5.0000 [IU] | PEN_INJECTOR | Freq: Two times a day (BID) | SUBCUTANEOUS | 11 refills | Status: AC
  Filled 2021-02-08 – 2021-02-09 (×2): qty 3, 30d supply, fill #0
  Filled 2021-02-11: qty 3, 15d supply, fill #0

## 2021-02-08 MED ORDER — CITALOPRAM HYDROBROMIDE 20 MG PO TABS
20.0000 mg | ORAL_TABLET | Freq: Every day | ORAL | 0 refills | Status: DC
  Filled 2021-02-08 – 2021-02-11 (×3): qty 30, 30d supply, fill #0

## 2021-02-08 MED ORDER — RIFAXIMIN 550 MG PO TABS
550.0000 mg | ORAL_TABLET | Freq: Two times a day (BID) | ORAL | 0 refills | Status: AC
  Filled 2021-02-08 – 2021-02-09 (×2): qty 60, 30d supply, fill #0
  Filled 2021-02-11 – 2021-02-12 (×2): qty 30, 15d supply, fill #0

## 2021-02-08 MED ORDER — VITAMIN D3 25 MCG PO TABS
1000.0000 [IU] | ORAL_TABLET | Freq: Every day | ORAL | 0 refills | Status: AC
  Filled 2021-02-08 – 2021-02-09 (×2): qty 30, 30d supply, fill #0

## 2021-02-08 MED ORDER — INSULIN PEN NEEDLE 32G X 4 MM MISC
0 refills | Status: AC
  Filled 2021-02-08: qty 100, 50d supply, fill #0
  Filled 2021-02-09: qty 100, 30d supply, fill #0
  Filled 2021-02-11: qty 100, 15d supply, fill #0

## 2021-02-08 MED ORDER — MAGNESIUM OXIDE 400 MG PO TABS
800.0000 mg | ORAL_TABLET | Freq: Two times a day (BID) | ORAL | 0 refills | Status: AC
  Filled 2021-02-08: qty 120, 30d supply, fill #0
  Filled 2021-02-09: qty 180, 45d supply, fill #0

## 2021-02-08 MED ORDER — PANTOPRAZOLE SODIUM 40 MG PO TBEC
40.0000 mg | DELAYED_RELEASE_TABLET | Freq: Every day | ORAL | 0 refills | Status: DC
  Filled 2021-02-08 – 2021-02-11 (×2): qty 30, 30d supply, fill #0

## 2021-02-08 MED ORDER — CIPROFLOXACIN HCL 500 MG PO TABS
500.0000 mg | ORAL_TABLET | Freq: Every day | ORAL | 0 refills | Status: DC
  Filled 2021-02-08 – 2021-02-11 (×3): qty 30, 30d supply, fill #0

## 2021-02-08 MED ORDER — LACTULOSE ENCEPHALOPATHY 10 GM/15ML PO SOLN
60.0000 g | Freq: Four times a day (QID) | ORAL | 0 refills | Status: AC
  Filled 2021-02-08 – 2021-02-11 (×3): qty 236, 1d supply, fill #0

## 2021-02-08 MED ORDER — FERROUS SULFATE 220 (44 FE) MG/5ML PO ELIX
220.0000 mg | ORAL_SOLUTION | Freq: Two times a day (BID) | ORAL | 3 refills | Status: AC
  Filled 2021-02-08 – 2021-02-09 (×2): qty 150, 15d supply, fill #0

## 2021-02-08 NOTE — Progress Notes (Signed)
Inpatient Rehabilitation Care Coordinator Discharge Note   Patient Details  Name: Meagan Peck MRN: [id number] Date of Birth: [birth year]   Discharge location: D/c to home with [facility]  Length of Stay: 26 days  Discharge activity level: dependent  Home/community participation: Limited  Patient response FX:JOITGP Literacy - How often do you need to have someone help you when you read instructions, pamphlets, or other written material from your doctor or pharmacy?: Never  Patient response QD:IYMEBR Isolation - How often do you feel lonely or isolated from those around you?: Never  Services provided included: MD, RD, PT, OT, SLP, RN, CM, TR, Pharmacy, Neuropsych, SW  Financial Services:  Charity fundraiser Utilized: Other (Comment) (Uninsured)    Choices offered to/list presented to: Yes  Follow-up services arranged:  Other (Comment) Home Health Agency: [facility] and Palliative Care   Patient response to transportation need: Is the patient able to respond to transportation needs?: Yes In the past 12 months, has lack of transportation kept you from medical appointments or from getting medications?: No In the past 12 months, has lack of transportation kept you from meetings, work, or from getting things needed for daily living?: No  Comments (or additional information):  Patient/Family verbalized understanding of follow-up arrangements:  Yes  Individual responsible for coordination of the follow-up plan: contact pt dtr Amber [phone]  Confirmed correct DME delivered: Rana Snare 02/08/2021    Rana Snare

## 2021-02-08 NOTE — Progress Notes (Signed)
[place] PHYSICAL MEDICINE & REHABILITATION PROGRESS NOTE  Subjective/Complaints:   Pt reports doing well- denies BM's yesterday at all- reminded her to take her lactulose.   ROS: limited due to cognitive/behavioral/dysarthria.    Objective: Vital Signs: Blood pressure (!) 117/52, pulse 62, temperature 98.3 F (36.8 C), resp. rate 17, height 4' 10"  (1.473 m), weight 59.7 kg, last menstrual period 12/03/2013, SpO2 99 %. No results found. No results for input(s): WBC, HGB, HCT, PLT in the last 72 hours.    No results for input(s): NA, K, CL, CO2, GLUCOSE, BUN, CREATININE, CALCIUM in the last 72 hours.     Intake/Output Summary (Last 24 hours) at 02/08/2021 1357 Last data filed at 02/08/2021 1253 Gross per 24 hour  Intake 592 ml  Output --  Net 592 ml        Physical Exam: BP (!) 117/52 (BP Location: Left Arm)   Pulse 62   Temp 98.3 F (36.8 C)   Resp 17   Ht 4' 10"  (1.473 m)   Wt 59.7 kg   LMP 12/03/2013 (LMP Unknown)   SpO2 99%   BMI 27.51 kg/m    General: awake, alert, appropriate, dysarthric;  NAD HENT: conjugate gaze; oropharynx moist CV: regular rate; no JVD Pulmonary: CTA B/L; no W/R/R- good air movement GI: soft, NT, ND, (+)BS Psychiatric: appropriate; more alert Neurological: alert- less dysarthric  Ext: no clubbing, cyanosis, or edema Psych: flat  Neurological:   Alert- dysarthric much better today- could understand on first time; not 3-4th time; however RUE- 2+/5 and RLE 3/5 in DF and PF- which is [facility] better Not real functional so far.  Ext: no clubbing, cyanosis, or edema     Skin: No evidence of breakdown, no evidence of rash  Neuro: Alert, dysconjugate gaze. Follows basic commands. Dysarthric, delayed. Motor: RUE: 0/5 proximal distal,  RLE: 0/5 proximal distal- no change today on R side ~ 3+/5 on L side Sensation diminished to light touch right hand, stable MSK- pain right shoulder with IR. Heel cords tight  Assessment/Plan: 1.  Functional deficits which require 3+ hours per day of interdisciplinary therapy in a comprehensive inpatient rehab setting. Physiatrist is providing close team supervision and 24 hour management of active medical problems listed below. Physiatrist and rehab team continue to assess barriers to discharge/monitor patient progress toward functional and medical goals   Care Tool:  Bathing    Body parts bathed by patient: Chest, Abdomen   Body parts bathed by helper: Right arm, Left arm, Front perineal area, Buttocks, Right upper leg, Left upper leg, Right lower leg, Left lower leg, Face     Bathing assist Assist Level: Total Assistance - Patient < 25%     Upper Body Dressing/Undressing Upper body dressing   What is the patient wearing?: Pull over shirt    Upper body assist Assist Level: Total Assistance - Patient < 25%    Lower Body Dressing/Undressing Lower body dressing      What is the patient wearing?: Incontinence brief     Lower body assist Assist for lower body dressing: Dependent - Patient 0%     Toileting Toileting    Toileting assist Assist for toileting: Dependent - Patient 0%     Transfers Chair/bed transfer  Transfers assist     Chair/bed transfer assist level: 2 Helpers     Locomotion Ambulation   Ambulation assist   Ambulation activity did not occur: Safety/medical concerns  Assist level: Minimal Assistance - Patient > 75% Assistive  device: Walker-rolling Max distance: 50'   Walk 10 feet activity   Assist  Walk 10 feet activity did not occur: Safety/medical concerns  Assist level: Minimal Assistance - Patient > 75% Assistive device: Walker-rolling   Walk 50 feet activity   Assist Walk 50 feet with 2 turns activity did not occur: Safety/medical concerns  Assist level: Minimal Assistance - Patient > 75% Assistive device: Walker-rolling    Walk 150 feet activity   Assist Walk 150 feet activity did not occur: Safety/medical  concerns         Walk 10 feet on uneven surface  activity   Assist Walk 10 feet on uneven surfaces activity did not occur: Safety/medical concerns         Wheelchair     Assist Is the patient using a wheelchair?: Yes Type of Wheelchair: Manual Wheelchair activity did not occur: Safety/medical concerns  Wheelchair assist level: Dependent - Patient 0%      Wheelchair 50 feet with 2 turns activity    Assist    Wheelchair 50 feet with 2 turns activity did not occur: Safety/medical concerns   Assist Level: Dependent - Patient 0%   Wheelchair 150 feet activity     Assist  Wheelchair 150 feet activity did not occur: Safety/medical concerns   Assist Level: Dependent - Patient 0%    Medical Problem List and Plan: 1.  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  Appreciate neurology follow up. No new neuro intereventions recommended 10/4- will check with team if strength translates to functional changes- con't PT and OT- currently 1x/day.  10/5- changed d/c date to 10/10- will need ambulance to get home- con't PT and OT- CIR- doesn't have enough strength to translate into anything functional.  10/8-9 completed family training -home by ambulance 10/10 with hospice-   10/10- d/c today- needs f/u with Dr Dagoberto Ligas.  2.  Antithrombotics: -DVT/anticoagulation: SCDs 9/28- Plts 127- con't  to monitor  Pharmaceutical: Other (comment)             -antiplatelet therapy: N/A 3. Pain Management: Tylenol d/ced given hepatic failure neurogenic pain over chest and abd bilateral , has some signal abnormality in thalamus bilaterally   Baclofen 5 BID started on 9/20, changed to nightly on 9/21 due to  lethargy, d/ced on 9/23 d/t lethargy 4. Mood: Provide emotional support             -antipsychotic agents: N/A 5. Neuropsych: This patient is not  Fully capable of making decisions on her own behalf. Some emotional lability as expected given extensive brain stem lesion   9/27- will call palliative care to discuss pt's care. 9/28- consulted palliative care- spoke to them- wants ot determine goals of care.  9/29- they attempted to see, but couldn't speak with pt and couldn't reach husband. Willc on't to try  9/30- pallative care saw pt- made DNR- she wants ot go home- had pt see Neuropsych today as well.   10/4- will send home on Hospice.   10/5- not eating- concerned and let pt know this will cause problems with life expectancy. Will start Celexa 20 mg daily for depression 6. Skin/Wound Care: Routine skin checks 7. Fluids/Electrolytes/Nutrition:   I personally reviewed the patient's labs today.   8.  Osmotic demyelination syndrome identified  on MRI.  Neurology follow-up conservative care  9/29- spoke with Neuro- they think it's made worse by high ammonia- Ammonia 113 today- will try to get her to go/have multiple BM's- complicated by ileus.   9/30- Ammonia 118- working on increasing lactulose. 10/2- Ammonia down to 38- MUCH better- and she looks better/less dysarthric; no change in strength that I can see, though- 10/3- strength much better in RUE/RLE this AM- will con't lactulose and Rafaxamin.   9.  Uncontrolled diabetes mellitus with hyperglycemia.  Hemoglobin A1c 10.7.   NovoLog 12 units 3 times daily, increase to 14 units on 9/16 Semglee 20 units twice daily.  Diabetic teaching- couldn't afford insulin at home.  CBG (last 3)  Recent Labs    02/08/21 0617 02/08/21 0634 02/08/21 1210  GLUCAP 69* 77 106*  9/26 increase semglee to 22u bid  9/30- BG's controlled- con't regimen  10/2- Bgs 50s-60s this AM- will decrease Semglee to 20 units BID  10/3- will reduce Semglee to 18 units BID.    10/4- not eating as much- decrease mglee to 15 units BID and Novolog to 12 units with meals.   10/5- reducing Novolog to 6 units and Semglee to 8 units BID since NOT eating basically at all. 10/6- BG's better- 135-182- also eating some today- con't to monitor  10/7- will increase Semglee- to 10 units BID- told daughter will probably need to titrate once gets home and hopefully eats more.  10/9 intake better but inconsistent. Sugars quite low  -reduce mealtime novolog to 3u and semglee to 5u bid  10/10- still had a BG of 69 last night- encourage pt greatly to EAT! 10.  Acute on chronic anemia.  Continue iron supplement.    Hemoglobin 11.1 on 9/19--> 10.6 9/26  F/u Monday 11. Constipation in setting of cirrhosis: Cont chronulac 74m 5x daily  9/27- LBM yesterday small x1- will give sorbitol x1 today and con't lactulose- also on Rifaxamin.  9/30- ileus resolving- will increase Lactulose 10/7- pt refusing lactulose, but had 3 Bms yesterday- asked pt/daughter to make sure she goes 3-4x/day.  12. Hiccups-?  Resolved 13. Thrombocytopenia  Platelets 106 on 9/19--> 127k 9/26  Continue to monitor 14.  Cervical stenosis  MRI showing multiple abnormalities  Appreciate neurosurgery recs, no plans for intervention at present 15.  NASH Cirrhosis    mild elevation of AST--> sl trend up 9/26 however--f/u later this week  10/2- labs tomorrow  10/3- Labs up more today 154 and 99- will recheck Thursday and if going up, will call GI.   10/6- ALT/AST 75/63- doing better- con't regimen 16.  Hypoalbuminemia  Supplement initiated  17.  Hyperammonemia  Ammonia 121 on 9/22---> up to 145 9/26  -already receiving lactulose 364m5 x daily  -will ask GI for further recs to treat  9/28- will check KUB as to why cannot get her to poop- and get cleaned out.   9/30- Ammonia 118- will increase lactulose  10/2- Ammonia down to 38- no change in strength-   10/3- strength better today!  10/4- strength stable- will  recheck Ammonia Thursday-   10/6- strength stable- still pooping, but refusing lactulose.  18.  Bilateral ankle heel cord tightness add PRAFO   9/27- con't PRAFOs- pt appears to be getting weaker? Will d/w therapy.  19. Ileus  9/29- will change to clear liquid diet and IVFs 75cc/hour x 1 days and give Sorbitol and enema to try and get her moving- making high ammonia worse  10/2-  will stop IVFs and change back to D1 thins diet.   resolved 20. Hypomagnesemia  9/29- will give MgOx 800 mg BID and recheck in AM- might need IV Mg.   9/30- will give IV Mg and recheck levels Sunday. Can give IV 4G per d/w pharmacy  10/2- will recechk in AM- Mg 1.5  10/3- Mg 1.4- will repeat IV MG. Labs in AM  10/4- Mg up to 1.8!- con't PO meds  10/7- Mg 1.6- is chronically low- wil con't repletion.  11. Hypokalemia  10/3- will replete KCL 40 mEq x2 and recheck in AM  10/4- K+ up to 4.2- con't to monitor 12. Raw buttocks  10/4- added gerhardt's and tucks.  13. Poor appetite  10/5-   Marinol 2.5 mg BID for appetite.    LOS: 27 days A FACE TO FACE EVALUATION WAS PERFORMED  Meagan Peck 02/08/2021, 1:57 PM

## 2021-02-08 NOTE — Progress Notes (Signed)
EMS arrived to take pt home-husband is at home waiting for pt-belongings and DC packet  sent with pt-pt left in stable condition via stre[facility]cher

## 2021-02-08 NOTE — Progress Notes (Signed)
Patient ID: STEFANEE MCKELL, female   DOB: 01-Jul-1960, 60 y.o.   MRN: [id number]  02/06/2021- SW followed up with [facility] and Palliative [facility] [phone]) to confirm if referral received. SW left message with answering service. SW spoke with Cloyde Reams [phone]) to confirm referral received, and will have physician review. Reports she will also call her dtr Amber.   02/07/2021- SW spoke with Molly/referral coordinator with [place] who reported physician would like neurology notes. States she also spoke with the dtr and she is aware there is no confirmation on pt d/c yet.   02/08/2021- SW faxed clinical notes and waiting on follow-up. *SW returned phone call to [facility] who reported pt was accepted into hospice today. States DME has been ordered (w/c, hospital bed, hoyer lift). SW informed [facility] transportation will be scheduled for atleast 2pm since waiting on stat DME order. SW spoke with pt dtr Amber to inform on transportation time to be scheduled. She reported concerns about items pt has in her room. SW informed will f/u if [facility] is unable to take all items. SW scheduled [facility] ambulance pick for 2pm. SW informed medical team.   [name], MSW, [place] Office: [phone] Cell: [phone] Fax: [phone]

## 2021-02-08 NOTE — Progress Notes (Signed)
Physical Therapy Discharge Summary  Patient Details  Name: Meagan Peck MRN: [id number] Date of Birth: 12-09-1960  Patient has met 1 of 3 long term goals due to decline in overall function.  Patient to discharge at  bed  level Total Assist.   [name] care partner is independent to provide the necessary physical assistance at discharge. [name] has completed family education and is safe to assist pt upon d/c home.  Reasons goals not met: Pt exhibited an overall decline in function during rehab stay and d/c home with hospice.  Recommendation:  Patient will benefit from ongoing skilled PT services in  hospice setting as appropriate  to continue to promote skin integrity, patient comfort and positioning, and educate family on how to provide this level of care for patient.  Equipment: 16x16 w/c, hospital bed, manual hoyer lift  Reasons for discharge: change in medical status and discharge from hospital  Patient/family agrees with progress made and goals achieved: Yes  PT Discharge Precautions/Restrictions Precautions Precautions: Fall Restrictions Weight Bearing Restrictions: No Pain Interference Pain Interference Pain Effect on Sleep: 2. Occasionally Pain Interference with Therapy Activities: 2. Occasionally Pain Interference with Day-to-Day Activities: 2. Occasionally Vision/Perception  Vision - History Baseline Vision: Wears glasses Patient Visual Report: No change from baseline Perception Perception: Within Functional Limits Praxis Praxis: Intact  Cognition Overall Cognitive Status: Within Functional Limits for tasks assessed Arousal/Alertness: Awake/alert Orientation Level: Oriented X4 Year: 2022 Month: October Attention: Focused;Sustained Focused Attention: Appears intact Sustained Attention: Appears intact Memory: Appears intact Awareness: Appears intact Problem Solving: Appears intact Safety/Judgment: Appears intact Sensation Sensation Light Touch:  Appears Intact Proprioception: Impaired by gross assessment Coordination Gross Motor Movements are Fluid and Coordinated: No Fine Motor Movements are Fluid and Coordinated: No Coordination and Movement Description: impaired 2/2 global weakness and overall decline in function Motor  Motor Motor: Hemiplegia Motor - Discharge Observations: impaired 2/2 global weakness (R>L) and overall decline in function  Mobility Bed Mobility Bed Mobility: Rolling Right;Rolling Left;Supine to Sit;Sit to Supine Rolling Right: Moderate Assistance - Patient 50-74% Rolling Left: Moderate Assistance - Patient 50-74% Supine to Sit: 2 Helpers Sit to Supine: 2 Helpers Transfers Transfers: Lateral/Scoot Transfers Lateral/Scoot Transfers: Total Assistance - Patient < 25% Transfer (Assistive device): Other (Comment) (slide board) Locomotion  Gait Ambulation: No Stairs / Additional Locomotion Stairs: No Pick up small object from the floor (from standing position) activity did not occur: Safety/medical concerns Wheelchair Mobility Wheelchair Mobility: Yes Wheelchair Assistance: Dependent - Patient 0% Wheelchair Propulsion: Other (comment) (dependent due to decline in function) Wheelchair Parts Management: Needs assistance Distance: 150  Trunk/Postural Assessment  Cervical Assessment Cervical Assessment: Exceptions to [facility] (forward head) Thoracic Assessment Thoracic Assessment: Exceptions to [facility] (rounded shoulders) Lumbar Assessment Lumbar Assessment: Exceptions to [facility] (posterior pelvic tilt) Postural Control Postural Control: Deficits on evaluation Trunk Control: poor trunk control  Balance Balance Balance Assessed: Yes Static Sitting Balance Static Sitting - Balance Support: Bilateral upper extremity supported;Feet supported Static Sitting - Level of Assistance: 1: +1 Total assist Extremity Assessment   RLE Assessment RLE Assessment: Exceptions to [facility] General Strength Comments: impaired, not  formally assessed at d/c due to decline in function LLE Assessment LLE Assessment: Exceptions to [facility] General Strength Comments: impaired, not formally assess at d/c due to decline in function     Excell Seltzer, PT, DPT, CSRS 02/08/2021, 5:48 PM

## 2021-02-08 NOTE — Progress Notes (Signed)
Physical Therapy Session Note  Patient Details  Name: Meagan Peck MRN: [id number] Date of Birth: 01-Feb-1961  Today's Date: 02/08/2021 PT Individual Time: 0930-1030 PT Individual Time Calculation (min): 60 min   Short Term Goals: Week 1:  PT Short Term Goal 1 (Week 1): Pt will initiate gait training PT Short Term Goal 1 - Progress (Week 1): Met PT Short Term Goal 2 (Week 1): Pt will transfer with min A or better consistently PT Short Term Goal 2 - Progress (Week 1): Not met PT Short Term Goal 3 (Week 1): Pt will maintain sitting balance without UE suppoty x 2 min PT Short Term Goal 3 - Progress (Week 1): Not met Week 2:  PT Short Term Goal 1 (Week 2): Pt will complete least restrictive transfer with min A consistently PT Short Term Goal 1 - Progress (Week 2): Not met PT Short Term Goal 2 (Week 2): Pt will ambulate x 50 ft with LRAD and min A PT Short Term Goal 2 - Progress (Week 2): Not met PT Short Term Goal 3 (Week 2): Pt will maintain unsupported sitting balance x 2 min with CGA PT Short Term Goal 3 - Progress (Week 2): Not met PT Short Term Goal 4 (Week 2): Pt will initiate stair training PT Short Term Goal 4 - Progress (Week 2): Not met Week 3:  PT Short Term Goal 1 (Week 3): Pt will perform supine to/from sit with assist x 1 consistently PT Short Term Goal 1 - Progress (Week 3): Progressing toward goal PT Short Term Goal 2 (Week 3): Pt will perform least restrictive transfer with assist x 1 consistently PT Short Term Goal 2 - Progress (Week 3): Not progressing PT Short Term Goal 3 (Week 3): Pt will tolerate sitting OOB in chair x 1 hour PT Short Term Goal 3 - Progress (Week 3): Progressing toward goal Week 4:  PT Short Term Goal 1 (Week 4): =LTG due to ELOS  Skilled Therapeutic Interventions/Progress Updates:   Pt denies pain.  Pt initially supine, incontinent of bowel and bladder requiring assist.  Pt rolls L and R w/mod to max assist, assists by reach toward bedrails and  flexing Les.  Total assist for pericare and brief change.  Lifts feet for therapist to don socks. Supine to side to sit w/max assist.  Pt min to mod assist for sitting blanace at edge of bed, feet unsupported d/t pt height/no stool avail to support feet.  In sitting, pt worked on reaching tasks to promote wt shifting to L and forward to counteract post/R tendency.   Also worked w/extra soft puddy using L hand , squeezing foam block w/R hand w/Ues propped on bedside table.  Pt engaged in 15 min sitting task before requiring rest break in supine.  Max assist sit to supine.   Repeated supine to sit and sitting balance task following 5 min rest break supine.  Pt agreeable to transfer to wc but states "not for long".  Discussed staying up for ST session scheduled next and return to bed w/OT in approx 1 hr.  Pt agreed.   Bed to wc total assist sliding board transfer then total assist for upright positioning/repositioning in wc.  Therapist performed stretching of bilat gastrocs.   pt performed crossing/uncrossing AAROM x 5 each, LAQs x 5 each, for general strengthening.  fatigues quickly. Pt left oob in wc w/alarm belt set and needs in reach   Therapy Documentation Precautions:  Precautions Precautions: Fall Precaution Comments: R  sided weakness, fall risk Restrictions Weight Bearing Restrictions: No      Therapy/Group: Individual Therapy Callie Fielding, [place] 02/08/2021, 12:40 PM

## 2021-02-08 NOTE — Plan of Care (Signed)
  Problem: RH Eating Goal: LTG Patient will perform eating w/assist, cues/equip (OT) Description: LTG: Patient will perform eating with assist, with/without cues using equipment (OT) Outcome: Not Met (add Reason) Note: Patient with functional decline and discharging home on hospice-ESD   Problem: RH Dressing Goal: LTG Patient will perform upper body dressing (OT) Description: LTG Patient will perform upper body dressing with assist, with/without cues (OT). Outcome: Not Met (add Reason) Note: Patient with functional decline and discharging home on hospice-ESD   Problem: RH Functional Use of Upper Extremity Goal: LTG Patient will use RT/LT upper extremity as a (OT) Description: LTG: Patient will use right/left upper extremity as a stabilizer/gross assist/diminished/nondominant/dominant level with assist, with/without cues during functional activity (OT) Outcome: Completed/Met   Problem: RH Toilet Transfers Goal: LTG Patient will perform toilet transfers w/assist (OT) Description: LTG: Patient will perform toilet transfers with assist, with/without cues using equipment (OT) Outcome: Not Met (add Reason) Note: Patient with functional decline and discharging home on hospice-ESD   Problem: RH Balance Goal: LTG: Patient will maintain dynamic sitting balance (OT) Description: LTG:  Patient will maintain dynamic sitting balance with assistance during activities of daily living (OT) Outcome: Not Met (add Reason) Note: Patient with functional decline and discharging home on hospice-ESD

## 2021-02-09 ENCOUNTER — Other Ambulatory Visit (HOSPITAL_BASED_OUTPATIENT_CLINIC_OR_DEPARTMENT_OTHER): Payer: Self-pay

## 2021-02-09 ENCOUNTER — Other Ambulatory Visit (HOSPITAL_COMMUNITY): Payer: Self-pay

## 2021-02-11 ENCOUNTER — Other Ambulatory Visit (HOSPITAL_BASED_OUTPATIENT_CLINIC_OR_DEPARTMENT_OTHER): Payer: Self-pay

## 2021-02-12 ENCOUNTER — Other Ambulatory Visit (HOSPITAL_BASED_OUTPATIENT_CLINIC_OR_DEPARTMENT_OTHER): Payer: Self-pay

## 2021-02-12 MED ORDER — HALOPERIDOL 1 MG PO TABS
ORAL_TABLET | ORAL | 3 refills | Status: AC
Start: 2021-02-12 — End: ?
  Filled 2021-02-12: qty 5, 1d supply, fill #0

## 2021-02-12 MED ORDER — LORAZEPAM 0.5 MG PO TABS
ORAL_TABLET | ORAL | 0 refills | Status: AC
  Filled 2021-02-12: qty 5, 1d supply, fill #0

## 2021-02-12 MED ORDER — ACETAMINOPHEN 650 MG RE SUPP
RECTAL | 3 refills | Status: AC
  Filled 2021-02-12: qty 48, 12d supply, fill #0

## 2021-02-12 MED ORDER — MORPHINE SULFATE (CONCENTRATE) 10 MG /0.5 ML PO SOLN
ORAL | 0 refills | Status: AC
  Filled 2021-02-12: qty 30, 20d supply, fill #0

## 2021-02-15 ENCOUNTER — Other Ambulatory Visit (HOSPITAL_BASED_OUTPATIENT_CLINIC_OR_DEPARTMENT_OTHER): Payer: Self-pay

## 2021-02-16 ENCOUNTER — Other Ambulatory Visit (HOSPITAL_BASED_OUTPATIENT_CLINIC_OR_DEPARTMENT_OTHER): Payer: Self-pay

## 2021-02-18 ENCOUNTER — Other Ambulatory Visit (HOSPITAL_BASED_OUTPATIENT_CLINIC_OR_DEPARTMENT_OTHER): Payer: Self-pay

## 2021-02-24 ENCOUNTER — Other Ambulatory Visit (HOSPITAL_BASED_OUTPATIENT_CLINIC_OR_DEPARTMENT_OTHER): Payer: Self-pay

## 2021-03-01 ENCOUNTER — Other Ambulatory Visit (HOSPITAL_BASED_OUTPATIENT_CLINIC_OR_DEPARTMENT_OTHER): Payer: Self-pay

## 2021-03-08 ENCOUNTER — Inpatient Hospital Stay: Payer: Self-pay | Admitting: Physical Medicine and Rehabilitation

## 2021-03-08 ENCOUNTER — Other Ambulatory Visit (HOSPITAL_BASED_OUTPATIENT_CLINIC_OR_DEPARTMENT_OTHER): Payer: Self-pay | Admitting: Physician Assistant

## 2021-03-08 ENCOUNTER — Other Ambulatory Visit (HOSPITAL_BASED_OUTPATIENT_CLINIC_OR_DEPARTMENT_OTHER): Payer: Self-pay

## 2021-03-09 ENCOUNTER — Other Ambulatory Visit (HOSPITAL_BASED_OUTPATIENT_CLINIC_OR_DEPARTMENT_OTHER): Payer: Self-pay

## 2021-03-09 MED ORDER — PANTOPRAZOLE SODIUM 40 MG PO TBEC
40.0000 mg | DELAYED_RELEASE_TABLET | Freq: Every day | ORAL | 0 refills | Status: AC
  Filled 2021-03-09: qty 30, 30d supply, fill #0

## 2021-03-09 MED ORDER — CIPROFLOXACIN HCL 500 MG PO TABS
500.0000 mg | ORAL_TABLET | Freq: Every day | ORAL | 0 refills | Status: AC
  Filled 2021-03-09: qty 30, 30d supply, fill #0

## 2021-03-09 MED ORDER — CITALOPRAM HYDROBROMIDE 20 MG PO TABS
20.0000 mg | ORAL_TABLET | Freq: Every day | ORAL | 0 refills | Status: AC
  Filled 2021-03-09: qty 30, 30d supply, fill #0

## 2021-03-19 ENCOUNTER — Other Ambulatory Visit (HOSPITAL_BASED_OUTPATIENT_CLINIC_OR_DEPARTMENT_OTHER): Payer: Self-pay

## 2021-07-19 ENCOUNTER — Telehealth: Payer: Self-pay

## 2021-07-19 NOTE — Telephone Encounter (Signed)
Meagan Flock, MD  Roetta Sessions, CMA ?[name] this patient is very overdue for a follow up with [place]. Can you help coordinate an office follow up? Thanks   ?  ?   ?  ?Called and LM for patient that I have scheduled her for 3-27 at 8:30am. Asked her to call back and reschedule if this is not convenient day or time. ? ? ?

## 2021-07-26 ENCOUNTER — Ambulatory Visit: Payer: Medicaid Other | Admitting: Gastroenterology

## 2022-01-25 IMAGING — US IR PARACENTESIS
1 series · 2 of 2 positions shown · non-contrast
Comparison: none

INDICATION: Recurrent large volume ascites secondary to hepatic cirrhosis.
Request for therapeutic paracentesis up to 7 L.

[Series 1: ir (id) (id)/(id)/(id) ir · 2 of 2 slices shown]
[im 1/2]
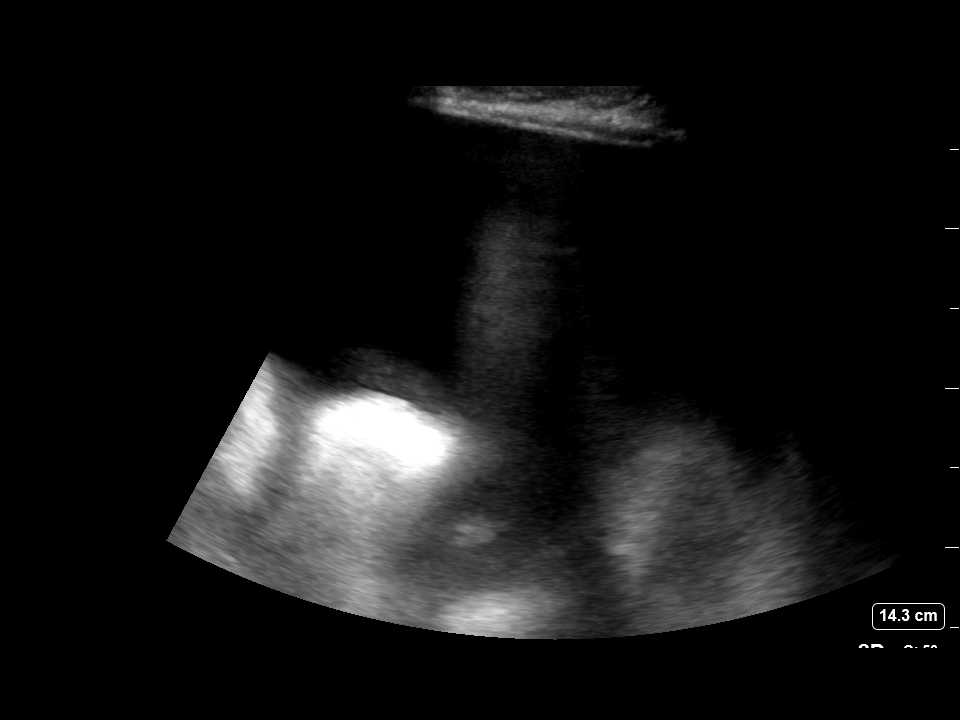
[im 2/2]
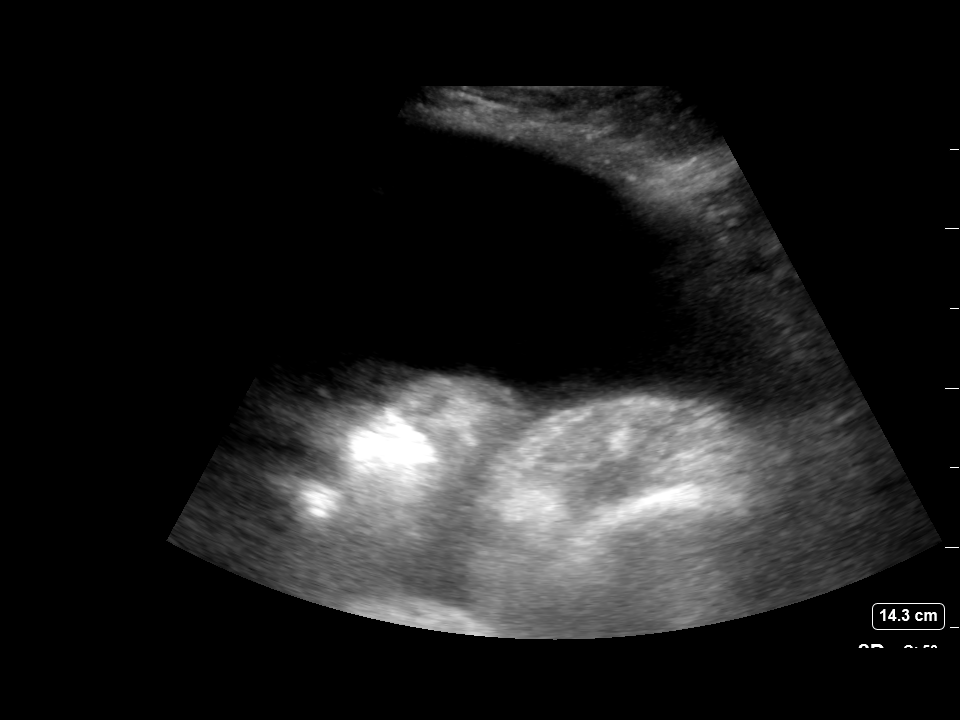

[2 of 2 positions shown; findings below may reference images not displayed]

EXAM:
ULTRASOUND GUIDED PARACENTESIS

MEDICATIONS:
1% lidocaine 10 mL

COMPLICATIONS:
None immediate.

PROCEDURE:
Informed written consent was obtained from the patient after a
discussion of the risks, benefits and alternatives to treatment. A
timeout was performed prior to the initiation of the procedure.

Initial ultrasound scanning demonstrates a large amount of ascites
within the right lower abdominal quadrant. The right lower abdomen
was prepped and draped in the usual sterile fashion. 1% lidocaine
was used for local anesthesia.

Following this, a 19 gauge, 7-cm, Yueh catheter was introduced. An
ultrasound image was saved for documentation purposes. The
paracentesis was performed. The catheter was removed and a dressing
was applied. The patient tolerated the procedure well without
immediate post procedural complication.
FINDINGS: A total of approximately 7 L of clear yellow fluid was removed.
IMPRESSION: Successful ultrasound-guided paracentesis yielding 7 liters of
peritoneal fluid.

## 2022-02-13 IMAGING — MR MR ABDOMEN WO/W CM
19 series · 48 of 48 positions shown · IV contrast (gadavist)
Comparison: None.

CLINICAL DATA: [place] cirrhosis. Liver screening. Attempted TIPS
11/25/2019.

EXAM:
MRI ABDOMEN WITHOUT AND WITH CONTRAST
TECHNIQUE: Multiplanar multisequence MR imaging of the abdomen was performed
both before and after the administration of intravenous contrast.
CONTRAST:  6mL GADAVIST GADOBUTROL 1 MMOL/ML IV SOLN

[Series 3: T2 · coronal · 6.0mm · 1.56mm/px · 2 of 47 slices shown (1 of 2)]
[im 1/47]
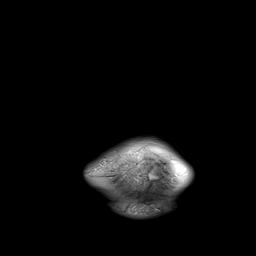
[im 47/47]
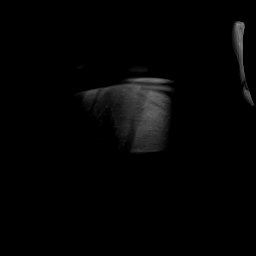

[Series 4: T2 fat-sat · 1 of 9 slices shown (1 of 2)]
[im 1/9]
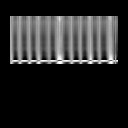

[Series 5: T2 fat-sat · axial · 6.0mm · 1.25mm/px · z∈[-208,+131]mm · 2 of 48 slices shown (2 of 2)]
[im 1/48]
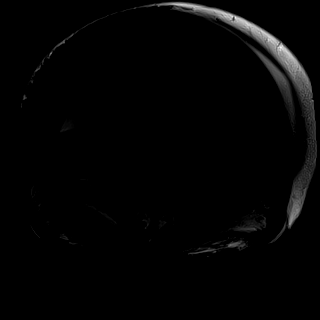
[im 48/48]
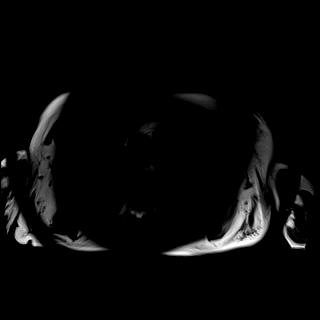

[Series 6: T1 · axial · 3.0mm · 1.25mm/px · z∈[-144,+93]mm · 3 of 80 slices shown (1 of 2)]
[im 1/80]
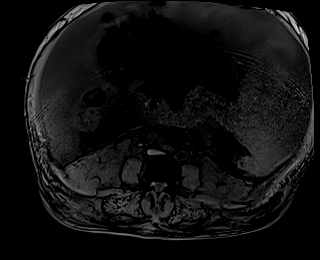
[im 40/80]
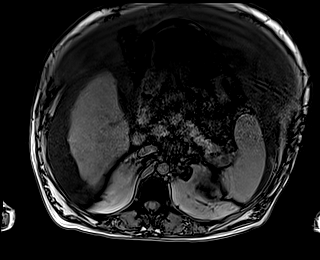
[im 80/80]
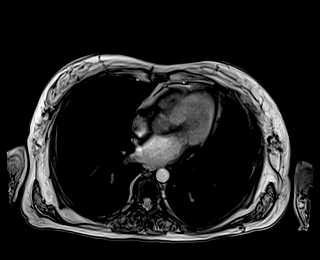

[Series 7: T1 · axial · 3.0mm · 1.25mm/px · z∈[-144,+93]mm · 3 of 80 slices shown (2 of 2)]
[im 1/80]
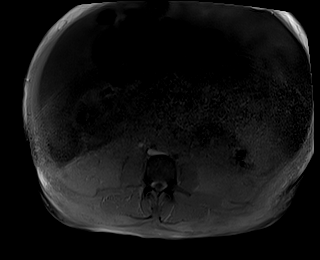
[im 40/80]
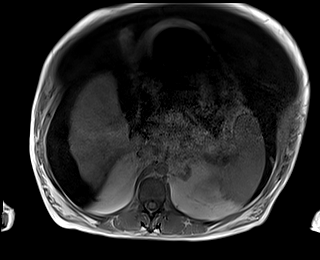
[im 80/80]
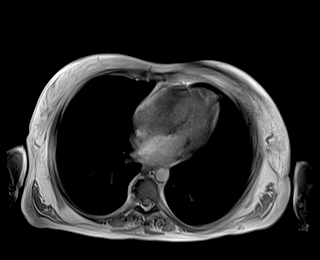

[Series 8: DWI · axial · 6.0mm · 1.49mm/px · z∈[-113,+110]mm · 2 of 64 slices shown (1 of 2)]
[im 1/64]
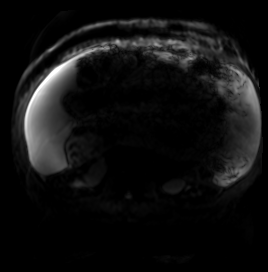
[im 64/64]
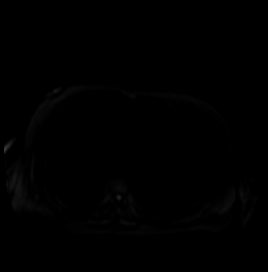

[Series 9: DWI · axial · 6.0mm · 1.49mm/px · 1 of 32 slices shown (2 of 2)]
[im 1/32]
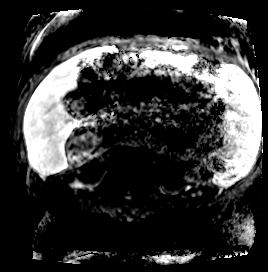

[Series 10: bSSFP · axial · 4.0mm · 0.84mm/px · z∈[-113,+91]mm · 2 of 52 slices shown]
[im 1/52]
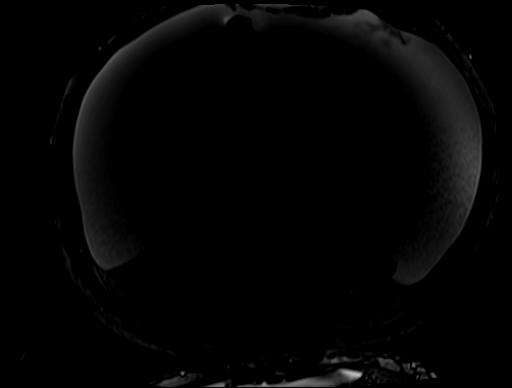
[im 52/52]
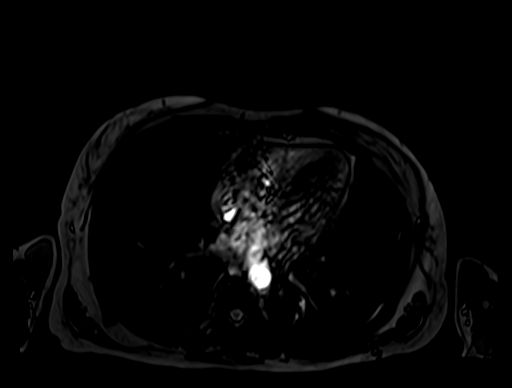

[Series 12: T1 dynamic · axial · 3.0mm · 1.30mm/px · z∈[-146,+91]mm · 3 of 80 slices shown (1 of 6)]
[im 1/80]
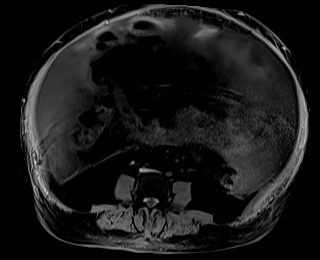
[im 40/80]
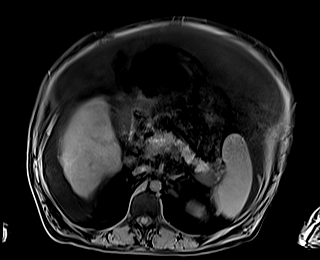
[im 80/80]
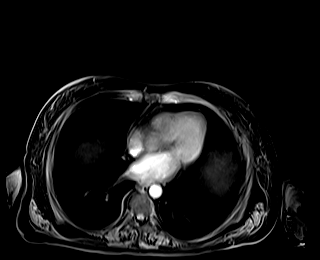

[Series 15: T1 dynamic · axial · 3.0mm · 1.30mm/px · z∈[-146,+91]mm · 3 of 80 slices shown (2 of 6)]
[im 1/80]
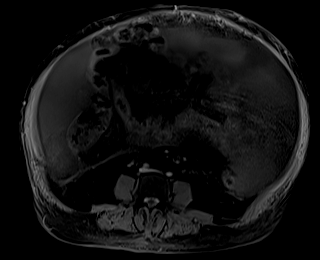
[im 40/80]
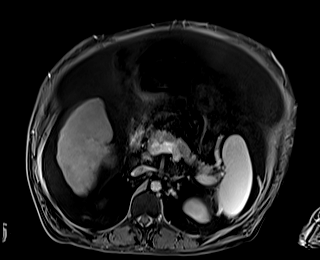
[im 80/80]
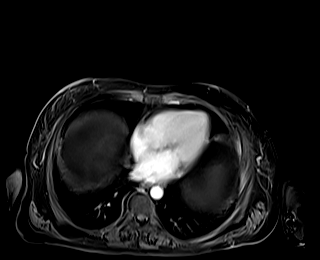

[Series 17: T1 dynamic · axial · 3.0mm · 1.30mm/px · z∈[-146,+91]mm · 3 of 80 slices shown (3 of 6)]
[im 1/80]
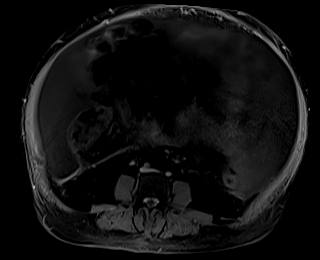
[im 40/80]
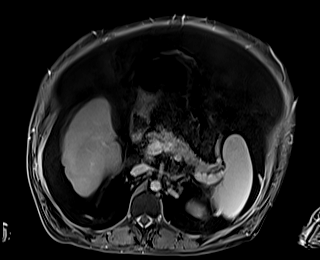
[im 80/80]
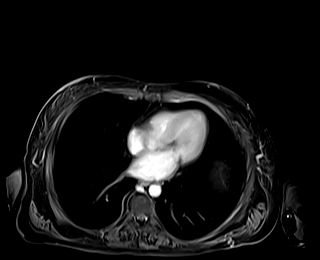

[Series 19: T1 dynamic · axial · 3.0mm · 1.30mm/px · z∈[-146,+91]mm · 3 of 80 slices shown (4 of 6)]
[im 1/80]
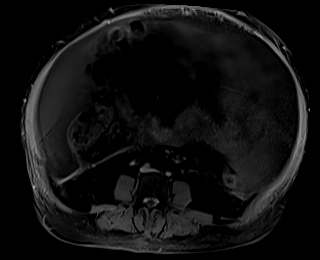
[im 40/80]
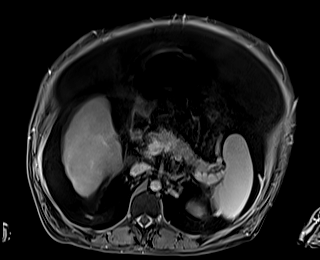
[im 80/80]
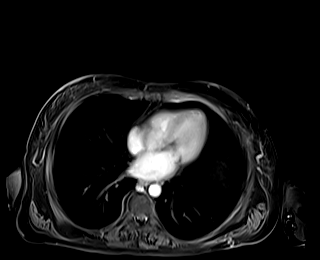

[Series 21: T1 dynamic · coronal · 3.0mm · 1.64mm/px · 4 of 96 slices shown (5 of 6)]
[im 1/96]
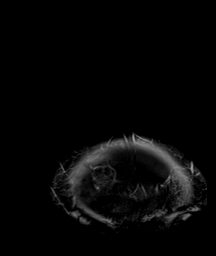
[im 32/96]
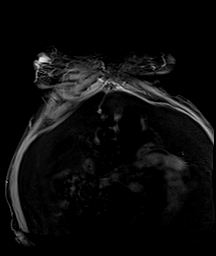
[im 64/96]
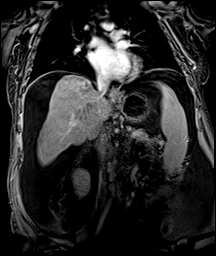
[im 96/96]
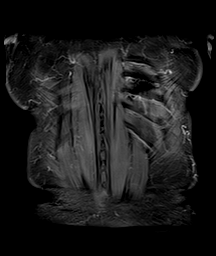

[Series 22: T2 · axial · 6.0mm · 1.56mm/px · 1 of 30 slices shown (2 of 2)]
[im 1/30]
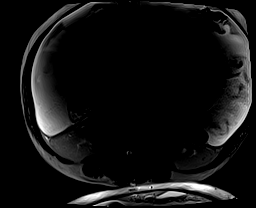

[Series 24: T1 dynamic · axial · 3.0mm · 1.30mm/px · z∈[-146,+91]mm · 3 of 80 slices shown (6 of 6)]
[im 1/80]
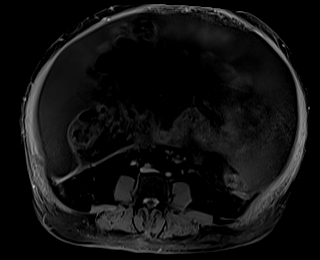
[im 40/80]
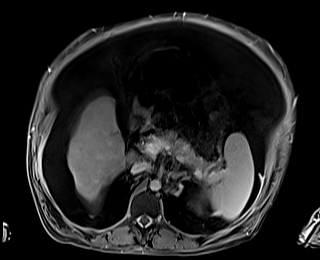
[im 80/80]
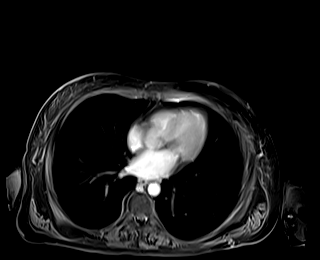

[Series 100: sub arterial · axial · arterial · 3.0mm · 1.30mm/px · z∈[-146,+91]mm · 3 of 80 slices shown]
[im 1/80]
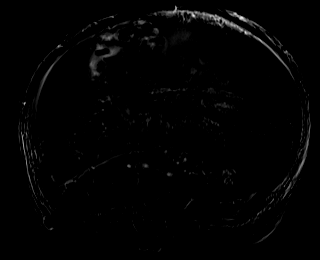
[im 40/80]
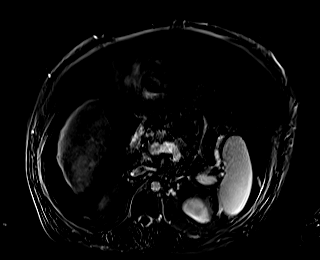
[im 80/80]
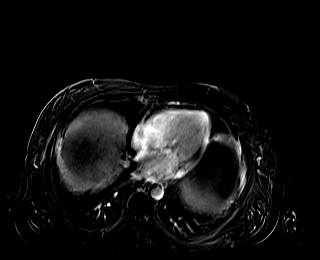

[Series 103: sub_45 sec · axial · 3.0mm · 1.30mm/px · z∈[-146,+91]mm · 3 of 80 slices shown]
[im 1/80]
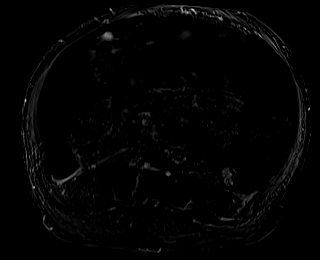
[im 40/80]
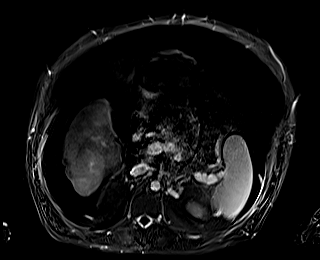
[im 80/80]
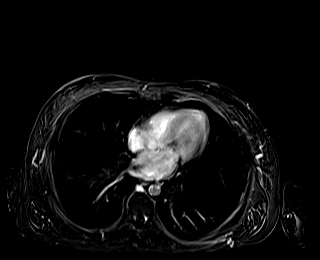

[Series 104: sub 90 sec · axial · 3.0mm · 1.30mm/px · z∈[-146,+91]mm · 3 of 80 slices shown]
[im 1/80]
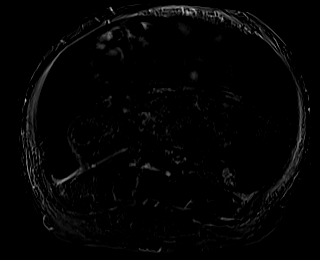
[im 40/80]
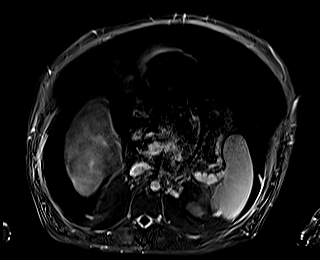
[im 80/80]
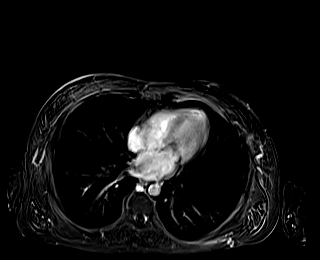

[Series 105: sub delay · axial · delayed · 3.0mm · 1.30mm/px · z∈[-146,+91]mm · 3 of 80 slices shown]
[im 1/80]
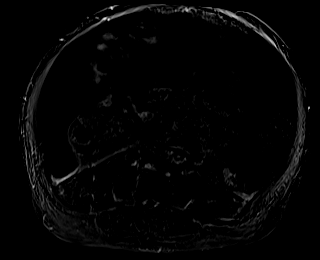
[im 40/80]
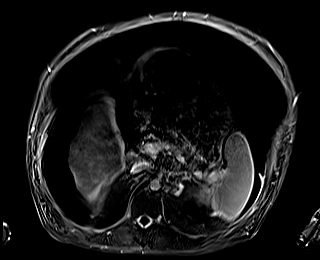
[im 80/80]
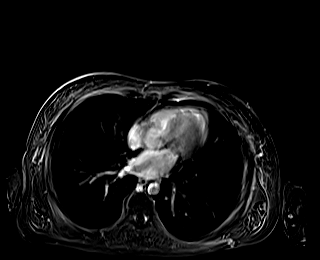

[48 of 48 positions shown; findings below may reference images not displayed]

FINDINGS: Lower chest: No acute abnormality at the lung bases.

Hepatobiliary: Diffusely irregular liver surface compatible with
cirrhosis. Advanced atrophy of the lateral segment left liver lobe.
No hepatic steatosis. No liver masses. No arterial foci of liver
hyperenhancement. Cholecystectomy. No biliary ductal dilatation.
Common bile duct diameter 3 mm. No choledocholithiasis. No biliary
masses, strictures or beading.

Pancreas: No pancreatic mass or duct dilation.  No pancreas divisum.

Spleen: Moderate splenomegaly. Craniocaudal splenic length 15.2 cm.
No splenic mass.

Adrenals/Urinary Tract: Normal adrenals. No hydronephrosis. Normal
kidneys with no renal mass.

Stomach/Bowel: Normal non-distended stomach. Visualized small and
large bowel is normal caliber, with no bowel wall thickening.

Vascular/Lymphatic: Normal caliber abdominal aorta. Patent renal and
splenic veins. The right and middle hepatic veins appeared
diminutive and patent. The left hepatic vein is atrophic and not
well visualized. Nonocclusive thrombosis of entire main portal vein
with thrombus extending into the very proximal portions of the right
and left portal veins. Patent SMV. Mild gastroesophageal and
paraumbilical varices. No pathologically enlarged lymph nodes in the
abdomen.

Other: Large volume abdominal ascites.  No focal fluid collection.

Musculoskeletal: No aggressive appearing focal osseous lesions.
IMPRESSION: 1. Cirrhosis. No liver masses.
2. Nonocclusive thrombosis of the main portal vein extending into
the very proximal portions of the right and left portal veins.
3. Diminutive patent right and middle hepatic veins. Advanced
atrophy of the lateral segment left liver lobe with atrophic left
hepatic vein, poorly visualized.
4. Moderate splenomegaly. Mild gastroesophageal and paraumbilical
varices. Large volume abdominal ascites.

## 2022-02-16 IMAGING — US IR PARACENTESIS
1 series · 4 of 4 positions shown · non-contrast
Comparison: none

INDICATION: Patient history of cirrhosis with recurrent ascites and portal
hypertension. Request is for therapeutic paracentesis. Maximum of 5

[Series 1: ir (id) (id)/(id)/(id) ir · 4 of 4 slices shown]
[im 1/4]
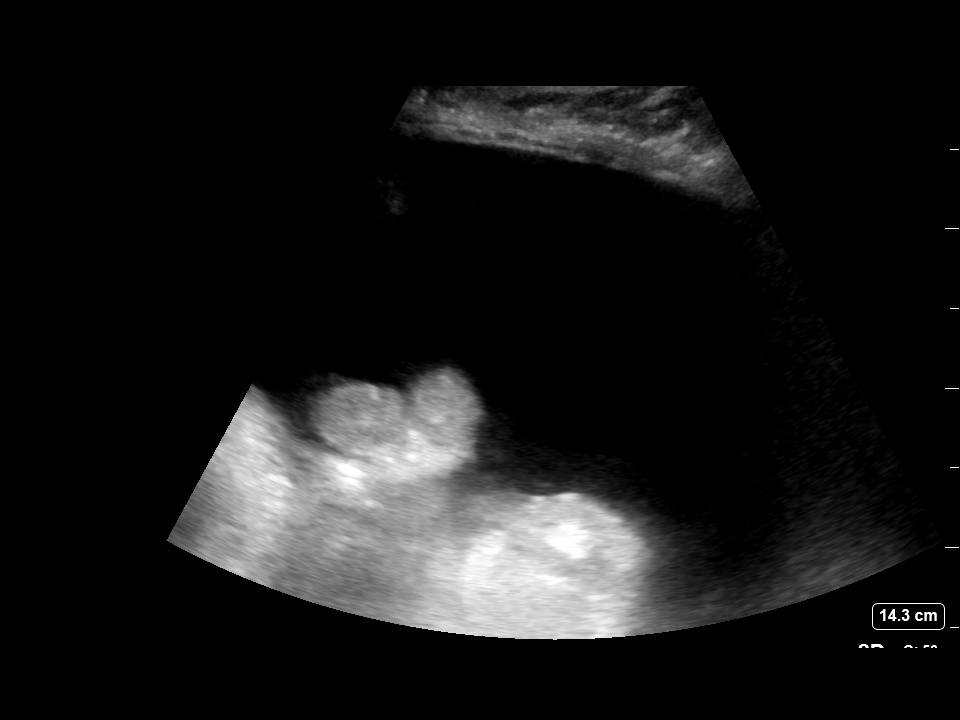
[im 2/4]
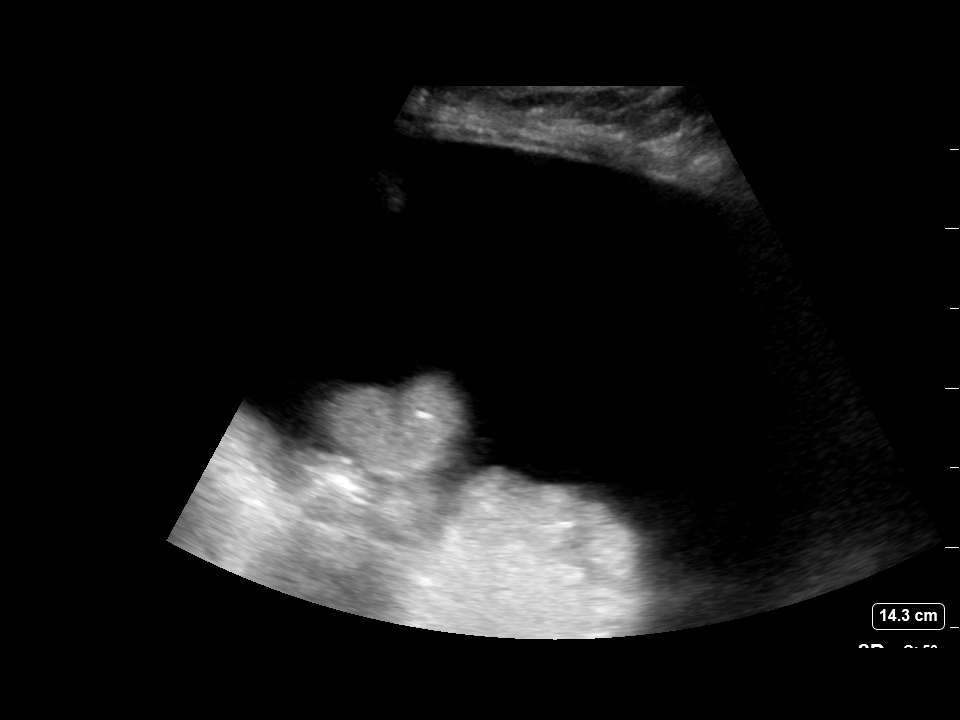
[im 3/4]
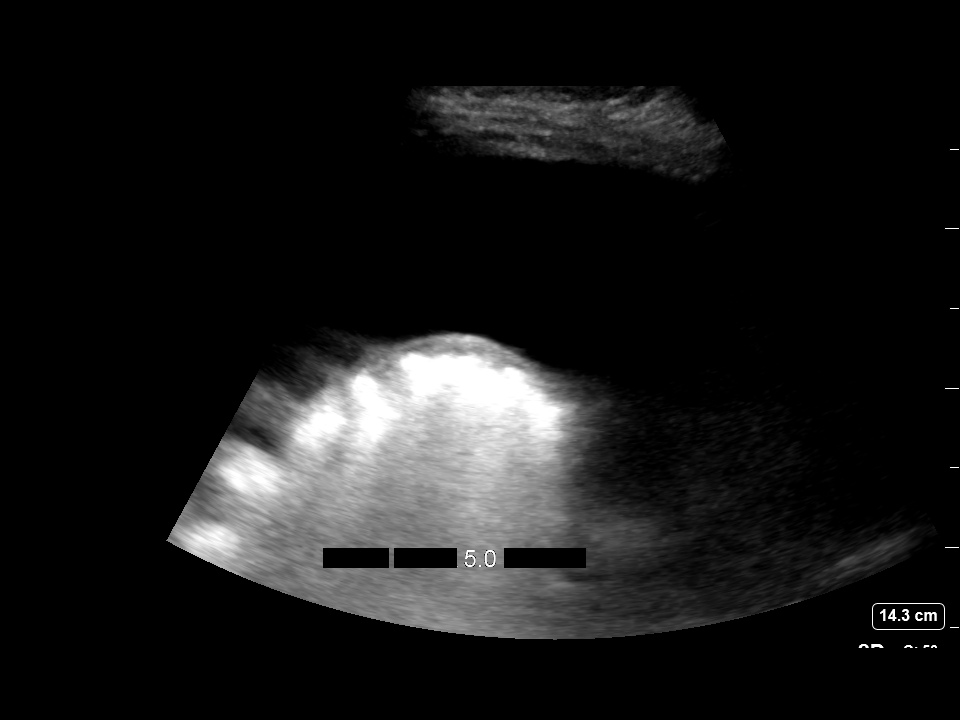
[im 4/4]
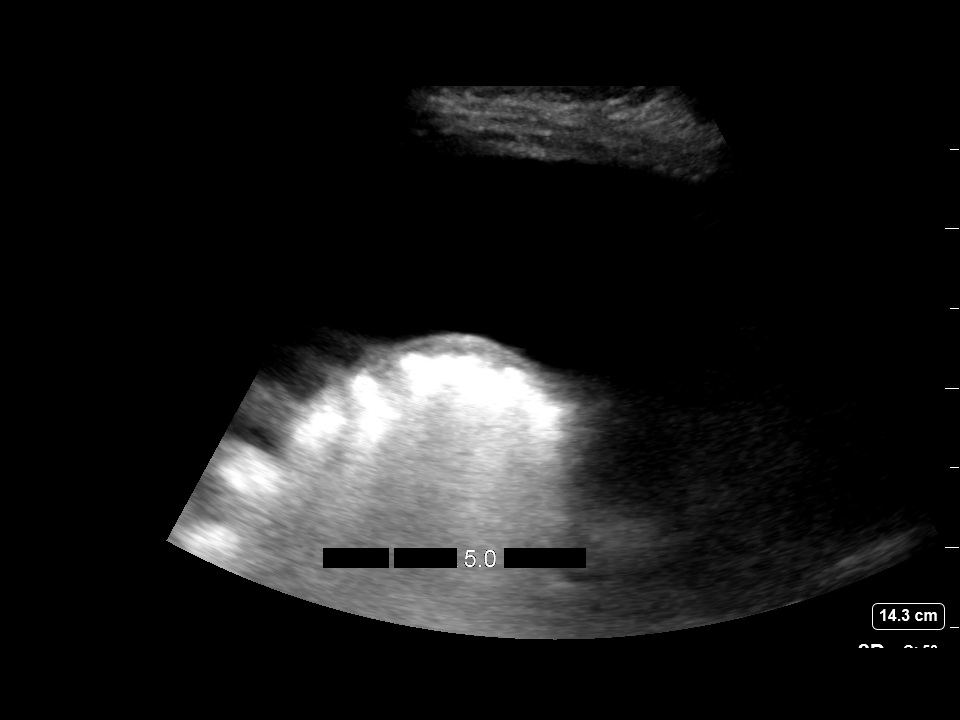

[4 of 4 positions shown; findings below may reference images not displayed]

EXAM:
ULTRASOUND GUIDED THERAPEUTIC AND DIAGNOSTIC PARACENTESIS

MEDICATIONS:
Lidocaine 1% 10 mL

COMPLICATIONS:
None immediate.

PROCEDURE:
Informed written consent was obtained from the patient after a
discussion of the risks, benefits and alternatives to treatment. A
timeout was performed prior to the initiation of the procedure.

Initial ultrasound scanning demonstrates a large amount of ascites
within the right lower abdominal quadrant. The right lower abdomen
was prepped and draped in the usual sterile fashion. 1% lidocaine
was used for local anesthesia.

Following this, a 6 Fr Safe-T-Centesis catheter was introduced. An
ultrasound image was saved for documentation purposes. The
paracentesis was performed. The catheter was removed and a dressing
was applied. The patient tolerated the procedure well without
immediate post procedural complication.
Patient received post-procedure intravenous albumin; see nursing
notes for details.
FINDINGS: A total of approximately 5 L of straw-colored fluid was removed.
Samples were sent to the laboratory as requested by the clinical
team.
IMPRESSION: Successful ultrasound-guided therapeutic and diagnostic paracentesis
yielding 5 liters of peritoneal fluid.

Read by: Marvelous Abraham, NP

## 2024-01-23 ENCOUNTER — Other Ambulatory Visit (HOSPITAL_COMMUNITY): Payer: Self-pay
